# Patient Record
Sex: Female | Born: 1965
Health system: Southern US, Community
[De-identification: ages and names within clinical notes are randomized; demographics above are authoritative.]

## PROBLEM LIST (undated history)

## (undated) DIAGNOSIS — E119 Type 2 diabetes mellitus without complications: Secondary | ICD-10-CM

## (undated) DIAGNOSIS — K219 Gastro-esophageal reflux disease without esophagitis: Secondary | ICD-10-CM

## (undated) DIAGNOSIS — M549 Dorsalgia, unspecified: Secondary | ICD-10-CM

## (undated) DIAGNOSIS — G8929 Other chronic pain: Secondary | ICD-10-CM

## (undated) DIAGNOSIS — G4730 Sleep apnea, unspecified: Secondary | ICD-10-CM

## (undated) DIAGNOSIS — I10 Essential (primary) hypertension: Secondary | ICD-10-CM

## (undated) DIAGNOSIS — F419 Anxiety disorder, unspecified: Secondary | ICD-10-CM

## (undated) DIAGNOSIS — IMO0001 Reserved for inherently not codable concepts without codable children: Secondary | ICD-10-CM

## (undated) DIAGNOSIS — F17200 Nicotine dependence, unspecified, uncomplicated: Secondary | ICD-10-CM

## (undated) DIAGNOSIS — M5412 Radiculopathy, cervical region: Secondary | ICD-10-CM

## (undated) DIAGNOSIS — J0100 Acute maxillary sinusitis, unspecified: Secondary | ICD-10-CM

## (undated) DIAGNOSIS — M4802 Spinal stenosis, cervical region: Secondary | ICD-10-CM

## (undated) DIAGNOSIS — F3130 Bipolar disorder, current episode depressed, mild or moderate severity, unspecified: Secondary | ICD-10-CM

## (undated) DIAGNOSIS — M79609 Pain in unspecified limb: Secondary | ICD-10-CM

## (undated) DIAGNOSIS — R209 Unspecified disturbances of skin sensation: Secondary | ICD-10-CM

## (undated) DIAGNOSIS — E1129 Type 2 diabetes mellitus with other diabetic kidney complication: Secondary | ICD-10-CM

## (undated) DIAGNOSIS — M545 Low back pain, unspecified: Secondary | ICD-10-CM

## (undated) DIAGNOSIS — J329 Chronic sinusitis, unspecified: Secondary | ICD-10-CM

## (undated) DIAGNOSIS — J3089 Other allergic rhinitis: Secondary | ICD-10-CM

## (undated) DIAGNOSIS — R809 Proteinuria, unspecified: Secondary | ICD-10-CM

## (undated) DIAGNOSIS — Z1231 Encounter for screening mammogram for malignant neoplasm of breast: Secondary | ICD-10-CM

## (undated) DIAGNOSIS — E782 Mixed hyperlipidemia: Secondary | ICD-10-CM

## (undated) HISTORY — PX: HAND SURGERY: SHX662

## (undated) HISTORY — PX: ABDOMINAL HYSTERECTOMY: SHX81

## (undated) MED ORDER — METFORMIN ER 500 MG 24 HR TABLET,EXTENDED RELEASE
500 mg | ORAL_TABLET | Freq: Every day | ORAL | Status: DC
Start: ? — End: 2012-10-30

## (undated) MED ORDER — TRIAMCINOLONE ACETONIDE 55 MCG NASAL SPRAY AEROSOL
55 mcg | NASAL | Status: DC
Start: ? — End: 2013-08-08

## (undated) MED ORDER — TRAZODONE 300 MG TAB
300 mg | ORAL_TABLET | Freq: Every evening | ORAL | Status: DC
Start: ? — End: 2015-04-17

## (undated) MED ORDER — NICOTINE 10 MG INHALATION CARTRIDGE
10 mg | RESPIRATORY_TRACT | Status: AC | PRN
Start: ? — End: 2012-12-16

## (undated) MED ORDER — PANTOPRAZOLE 40 MG TAB, DELAYED RELEASE
40 mg | ORAL_TABLET | Freq: Every day | ORAL | Status: DC
Start: ? — End: 2015-04-17

## (undated) MED ORDER — AZITHROMYCIN 250 MG TAB
250 mg | ORAL_TABLET | ORAL | Status: DC
Start: ? — End: 2012-11-25

## (undated) MED ORDER — ALPRAZOLAM 1 MG TAB
1 mg | ORAL_TABLET | ORAL | Status: DC
Start: ? — End: 2013-06-19

## (undated) MED ORDER — NAPROXEN 500 MG TAB
500 mg | ORAL_TABLET | Freq: Two times a day (BID) | ORAL | Status: DC
Start: ? — End: 2014-01-29

## (undated) MED ORDER — ALPRAZOLAM 1 MG TAB
1 mg | ORAL_TABLET | ORAL | Status: DC
Start: ? — End: 2013-05-26

## (undated) MED ORDER — OXYCODONE-ACETAMINOPHEN 5 MG-325 MG TAB
5-325 mg | ORAL_TABLET | Freq: Two times a day (BID) | ORAL | Status: DC | PRN
Start: ? — End: 2012-10-25

## (undated) MED ORDER — CHLORTHALIDONE 25 MG TAB
25 mg | ORAL_TABLET | Freq: Every day | ORAL | Status: DC
Start: ? — End: 2013-04-26

## (undated) MED ORDER — CLONIDINE 0.2 MG TAB
0.2 mg | ORAL_TABLET | Freq: Two times a day (BID) | ORAL | Status: DC
Start: ? — End: 2015-05-03

## (undated) MED ORDER — ALPRAZOLAM 1 MG TAB
1 mg | ORAL_TABLET | ORAL | Status: DC
Start: ? — End: 2015-04-17

## (undated) MED ORDER — AMOXICILLIN CLAVULANATE 875 MG-125 MG TAB
875-125 mg | ORAL_TABLET | Freq: Two times a day (BID) | ORAL | Status: AC
Start: ? — End: 2013-08-10

## (undated) MED ORDER — INSULIN DETEMIR 100 UNIT/ML (3 ML) SUB-Q PEN
100 unit/mL (3 mL) | Freq: Two times a day (BID) | SUBCUTANEOUS | Status: DC
Start: ? — End: 2012-10-25

## (undated) MED ORDER — METFORMIN ER 500 MG 24 HR TABLET,EXTENDED RELEASE
500 mg | ORAL_TABLET | Freq: Every day | ORAL | Status: DC
Start: ? — End: 2012-12-05

## (undated) MED ORDER — OMEPRAZOLE 40 MG CAP, DELAYED RELEASE
40 mg | ORAL_CAPSULE | Freq: Every day | ORAL | Status: DC
Start: ? — End: 2012-10-25

## (undated) MED ORDER — ALPRAZOLAM 1 MG TAB
1 mg | ORAL_TABLET | ORAL | Status: DC
Start: ? — End: 2013-07-31

## (undated) MED ORDER — TRIAMCINOLONE ACETONIDE 55 MCG NASAL SPRAY AEROSOL
55 mcg | Freq: Every day | NASAL | Status: DC
Start: ? — End: 2013-07-31

## (undated) MED ORDER — METFORMIN 1,000 MG TAB
1000 mg | ORAL_TABLET | Freq: Two times a day (BID) | ORAL | Status: DC
Start: ? — End: 2013-04-26

## (undated) MED ORDER — PANTOPRAZOLE 40 MG TAB, DELAYED RELEASE
40 mg | ORAL_TABLET | Freq: Every day | ORAL | Status: DC
Start: ? — End: 2013-04-26

## (undated) MED ORDER — PEN NEEDLE, DIABETIC 32 GAUGE X 1/4"
32 gauge x 1/4" | Freq: Four times a day (QID) | Status: DC
Start: ? — End: 2014-01-29

## (undated) MED ORDER — NICOTINE 10 MG/ML NASAL SPRAY
10 mg/mL | Freq: Four times a day (QID) | NASAL | Status: DC | PRN
Start: ? — End: 2013-04-26

## (undated) MED ORDER — METFORMIN SR 500 MG 24 HR TABLET
500 mg | ORAL_TABLET | Freq: Two times a day (BID) | ORAL | Status: DC
Start: ? — End: 2013-10-31

## (undated) MED ORDER — INSULIN LISPRO 100 UNIT/ML (3 ML) SUB-Q PEN
100 unit/mL | SUBCUTANEOUS | Status: DC
Start: ? — End: 2014-01-29

## (undated) MED ORDER — FLUTICASONE 50 MCG/ACTUATION NASAL SPRAY, SUSP
50 mcg/actuation | Freq: Every day | NASAL | Status: DC
Start: ? — End: 2015-04-17

## (undated) MED ORDER — CLOPIDOGREL 75 MG TAB
75 mg | ORAL_TABLET | Freq: Every day | ORAL | Status: DC
Start: ? — End: 2013-04-26

## (undated) MED ORDER — PROMETHAZINE-CODEINE 6.25 MG-10 MG/5 ML SYRUP
Freq: Four times a day (QID) | ORAL | Status: DC | PRN
Start: ? — End: 2014-01-29

## (undated) MED ORDER — ALPRAZOLAM 1 MG TAB
1 mg | ORAL_TABLET | ORAL | Status: DC
Start: ? — End: 2013-04-26

## (undated) MED ORDER — ALPRAZOLAM 1 MG TAB
1 mg | ORAL_TABLET | Freq: Two times a day (BID) | ORAL | Status: DC
Start: ? — End: 2013-03-16

## (undated) MED ORDER — METFORMIN SR 500 MG 24 HR TABLET
500 mg | ORAL_TABLET | Freq: Two times a day (BID) | ORAL | Status: DC
Start: ? — End: 2015-05-03

## (undated) MED ORDER — FENOFIBRATE 160 MG TAB
160 mg | ORAL_TABLET | Freq: Every day | ORAL | Status: DC
Start: ? — End: 2013-04-26

## (undated) MED ORDER — PRAVASTATIN 20 MG TAB
20 mg | ORAL_TABLET | Freq: Every evening | ORAL | Status: DC
Start: ? — End: 2014-01-29

## (undated) MED ORDER — OXYCODONE-ACETAMINOPHEN 5 MG-325 MG TAB
5-325 mg | ORAL_TABLET | Freq: Two times a day (BID) | ORAL | Status: DC | PRN
Start: ? — End: 2012-11-25

## (undated) MED ORDER — TRAZODONE 300 MG TAB
300 mg | ORAL_TABLET | Freq: Every evening | ORAL | Status: DC
Start: ? — End: 2013-04-26

## (undated) MED ORDER — SIMVASTATIN 40 MG TAB
40 mg | ORAL_TABLET | Freq: Every evening | ORAL | Status: DC
Start: ? — End: 2012-12-26

## (undated) MED ORDER — VALSARTAN 160 MG TAB
160 mg | ORAL_TABLET | Freq: Every day | ORAL | Status: DC
Start: ? — End: 2013-04-26

## (undated) MED ORDER — LOSARTAN 50 MG TAB
50 mg | ORAL_TABLET | Freq: Every day | ORAL | Status: DC
Start: ? — End: 2014-01-29

## (undated) MED ORDER — INSULIN LISPRO 100 UNIT/ML (3 ML) SUB-Q PEN
100 unit/mL | SUBCUTANEOUS | Status: DC
Start: ? — End: 2012-10-25

## (undated) MED ORDER — SIMVASTATIN 40 MG TAB
40 mg | ORAL_TABLET | Freq: Every evening | ORAL | Status: DC
Start: ? — End: 2013-04-26

## (undated) MED ORDER — NICOTINE 21 MG/24 HR DAILY PATCH
21 mg/24 hr | MEDICATED_PATCH | TRANSDERMAL | Status: AC
Start: ? — End: 2013-05-26

## (undated) MED ORDER — HYDROCODONE-ACETAMINOPHEN 5 MG-325 MG TAB
5-325 mg | ORAL_TABLET | Freq: Three times a day (TID) | ORAL | Status: DC | PRN
Start: ? — End: 2013-07-31

## (undated) MED ORDER — INSULIN DETEMIR 100 UNIT/ML (3 ML) SUB-Q PEN
100 unit/mL (3 mL) | Freq: Two times a day (BID) | SUBCUTANEOUS | Status: DC
Start: ? — End: 2013-04-26

## (undated) MED ORDER — HYDROCODONE-ACETAMINOPHEN 5 MG-500 MG TAB
5-500 mg | ORAL_TABLET | Freq: Three times a day (TID) | ORAL | Status: DC | PRN
Start: ? — End: 2013-04-26

## (undated) MED ORDER — NICOTINE 10 MG INHALATION CARTRIDGE
10 mg | RESPIRATORY_TRACT | Status: DC | PRN
Start: ? — End: 2012-11-16

## (undated) MED ORDER — BUDESONIDE 32 MCG/ACTUATION NASAL SPRAY
32 mcg/actuation | Freq: Every day | NASAL | Status: DC
Start: ? — End: 2013-07-31

## (undated) MED ORDER — ERGOCALCIFEROL (VITAMIN D2) 50,000 UNIT CAP
1250 mcg (50,000 unit) | ORAL_CAPSULE | ORAL | Status: DC
Start: ? — End: 2014-01-29

## (undated) MED ORDER — OXYCODONE-ACETAMINOPHEN 5 MG-325 MG TAB
5-325 mg | ORAL_TABLET | Freq: Two times a day (BID) | ORAL | Status: DC | PRN
Start: ? — End: 2012-09-26

## (undated) MED ORDER — FENOFIBRATE 160 MG TAB
160 mg | ORAL_TABLET | Freq: Every day | ORAL | Status: DC
Start: ? — End: 2014-01-29

## (undated) MED ORDER — INSULIN LISPRO 100 UNIT/ML (3 ML) SUB-Q PEN
100 unit/mL | SUBCUTANEOUS | Status: DC
Start: ? — End: 2013-04-26

## (undated) MED ORDER — INSULIN DETEMIR 100 UNIT/ML (3 ML) SUB-Q PEN
100 unit/mL (3 mL) | Freq: Two times a day (BID) | SUBCUTANEOUS | Status: DC
Start: ? — End: 2014-01-29

## (undated) MED ORDER — INSULIN DETEMIR 100 UNIT/ML (3 ML) SUB-Q PEN
100 unit/mL (3 mL) | Freq: Two times a day (BID) | SUBCUTANEOUS | Status: DC
Start: ? — End: 2012-12-26

---

## 1998-12-20 ENCOUNTER — Emergency Department (HOSPITAL_COMMUNITY): Admission: EM | Admit: 1998-12-20 | Discharge: 1998-12-20 | Payer: Self-pay | Admitting: Emergency Medicine

## 1999-11-16 ENCOUNTER — Encounter: Payer: Self-pay | Admitting: Emergency Medicine

## 1999-11-16 ENCOUNTER — Emergency Department (HOSPITAL_COMMUNITY): Admission: EM | Admit: 1999-11-16 | Discharge: 1999-11-16 | Payer: Self-pay | Admitting: Emergency Medicine

## 2000-01-24 ENCOUNTER — Emergency Department (HOSPITAL_COMMUNITY): Admission: EM | Admit: 2000-01-24 | Discharge: 2000-01-25 | Payer: Self-pay

## 2000-01-26 ENCOUNTER — Emergency Department (HOSPITAL_COMMUNITY): Admission: EM | Admit: 2000-01-26 | Discharge: 2000-01-26 | Payer: Self-pay | Admitting: Emergency Medicine

## 2000-11-23 ENCOUNTER — Emergency Department (HOSPITAL_COMMUNITY): Admission: EM | Admit: 2000-11-23 | Discharge: 2000-11-23 | Payer: Self-pay | Admitting: Internal Medicine

## 2002-05-29 ENCOUNTER — Emergency Department (HOSPITAL_COMMUNITY): Admission: EM | Admit: 2002-05-29 | Discharge: 2002-05-29 | Payer: Self-pay | Admitting: Emergency Medicine

## 2002-06-05 ENCOUNTER — Emergency Department (HOSPITAL_COMMUNITY): Admission: EM | Admit: 2002-06-05 | Discharge: 2002-06-05 | Payer: Self-pay

## 2002-07-11 ENCOUNTER — Emergency Department (HOSPITAL_COMMUNITY): Admission: EM | Admit: 2002-07-11 | Discharge: 2002-07-11 | Payer: Self-pay | Admitting: Emergency Medicine

## 2002-07-11 ENCOUNTER — Encounter: Payer: Self-pay | Admitting: Emergency Medicine

## 2002-11-06 ENCOUNTER — Emergency Department (HOSPITAL_COMMUNITY): Admission: EM | Admit: 2002-11-06 | Discharge: 2002-11-06 | Payer: Self-pay | Admitting: Emergency Medicine

## 2002-11-06 ENCOUNTER — Encounter: Payer: Self-pay | Admitting: Emergency Medicine

## 2003-03-01 ENCOUNTER — Emergency Department (HOSPITAL_COMMUNITY): Admission: EM | Admit: 2003-03-01 | Discharge: 2003-03-01 | Payer: Self-pay | Admitting: Emergency Medicine

## 2003-03-01 ENCOUNTER — Encounter: Payer: Self-pay | Admitting: Emergency Medicine

## 2008-01-04 LAB — HEMOGLOBIN A1C WITH EAG: Hemoglobin A1c: 7.1 % — ABNORMAL HIGH (ref 4.2–5.8)

## 2008-01-04 LAB — ESTRADIOL: ESTRADIOL: 14 pg/mL — ABNORMAL LOW (ref 17–184)

## 2008-01-31 LAB — CBC WITH AUTOMATED DIFF
ABS. BASOPHILS: 0 10*3/uL (ref 0.0–0.1)
ABS. EOSINOPHILS: 0 10*3/uL (ref 0.0–0.4)
ABS. LYMPHOCYTES: 2.8 10*3/uL (ref 0.8–3.5)
ABS. MONOCYTES: 0.4 10*3/uL (ref 0–1.0)
ABS. NEUTROPHILS: 2.3 10*3/uL (ref 1.8–8.0)
BASOPHILS: 0 % (ref 0–1)
EOSINOPHILS: 1 % (ref 0–7)
HCT: 44.8 % (ref 35.0–47.0)
HGB: 14.1 g/dL (ref 11.5–16.0)
LYMPHOCYTES: 50 % — ABNORMAL HIGH (ref 12–49)
MCH: 29.4 PG (ref 26.0–34.0)
MCHC: 31.5 g/dL (ref 30.0–35.0)
MCV: 93.5 FL (ref 80.0–99.0)
MONOCYTES: 7 % (ref 5–13)
NEUTROPHILS: 42 % (ref 32–75)
PLATELET: 247 10*3/uL (ref 150–400)
RBC: 4.79 M/uL (ref 3.80–5.20)
RDW: 14.2 % (ref 11.5–14.5)
WBC: 5.5 10*3/uL (ref 3.6–11.0)

## 2008-01-31 LAB — VALPROIC ACID: Valproic acid: NOT DETECTED ug/ml (ref 50–100)

## 2008-01-31 LAB — METABOLIC PANEL, BASIC
Anion gap: 15 (ref 5–15)
BUN/Creatinine ratio: 11 — ABNORMAL LOW (ref 12–20)
BUN: 15 MG/DL (ref 6–20)
CO2: 23 MMOL/L (ref 21–32)
Calcium: 8.9 MG/DL (ref 8.5–10.1)
Chloride: 100 MMOL/L (ref 97–108)
Creatinine: 1.4 MG/DL — ABNORMAL HIGH (ref 0.6–1.3)
GFR est AA: 53 mL/min/{1.73_m2} — ABNORMAL LOW (ref >60)
GFR est non-AA: 44 mL/min/{1.73_m2} — ABNORMAL LOW (ref >60)
Glucose: 95 MG/DL (ref 65–105)
Potassium: 4.2 MMOL/L (ref 3.5–5.1)
Sodium: 138 MMOL/L (ref 136–145)

## 2008-02-02 LAB — HIV-1 RNA QL
HIV 1 Ab: NEGATIVE
HIV1 ANTIBODY,HIVR: NEGATIVE

## 2008-07-11 MED ORDER — METOPROLOL SUCCINATE SR 50 MG 24 HR TAB
50 mg | ORAL_TABLET | Freq: Every day | ORAL | Status: DC
Start: 2008-07-11 — End: 2009-03-04

## 2008-07-11 MED ORDER — OXYCODONE-ACETAMINOPHEN 5 MG-325 MG TAB
5-325 mg | ORAL_TABLET | Freq: Two times a day (BID) | ORAL | Status: DC
Start: 2008-07-11 — End: 2008-08-07

## 2008-07-11 MED ORDER — ALPRAZOLAM 1 MG TAB
1 mg | ORAL_TABLET | Freq: Two times a day (BID) | ORAL | Status: DC
Start: 2008-07-11 — End: 2008-12-18

## 2008-07-11 MED ORDER — TRAZODONE 100 MG TAB
100 mg | ORAL_TABLET | ORAL | Status: DC
Start: 2008-07-11 — End: 2008-10-31

## 2008-07-11 MED ORDER — ESOMEPRAZOLE MAGNESIUM 40 MG CAP, DELAYED RELEASE
40 mg | ORAL_CAPSULE | Freq: Every day | ORAL | Status: DC
Start: 2008-07-11 — End: 2008-07-13

## 2008-07-11 NOTE — Progress Notes (Signed)
Ms. Claros is here for follow up of her chronic illnesses including hypertension, chronic low back pain, osteoarthritis and anxiety/depression with bipolar disorder.  Patient has had increasing headaches, tension in nature, over the last few weeks, but has been out of her Toprol-XL.  Patient denies any nausea, vomiting, chest pain, shortness of breath, fevers, chills, paresthesias or weakness.  Patient's symptoms are fairly well controlled with her current medications.  Patient is due for her annual Pap and pelvic, but will do this next visit along with her lab work.      Physical Exam:   GENERAL:  A mildly obese black female in no acute distress.    LUNGS:  Clear to auscultation bilaterally.  HEART:  Regular rate and rhythm without murmurs, rubs or gallops.    ABDOMEN:  Soft and non-tender.   EXTREMITIES:  No clicks, clubbing or edema.   NEUROLOGICALLY:  Plus two/four deep tendon reflexes bilaterally.  Normal sensation and strength.  Negative straight leg raising.    BACK:  Positive lower lumbar tenderness to palpation.  Decreased range of motion in flexion and extension.      Impression:  1.  Hypertension.   2.  Bipolar disorder.  3.  Chronic low back pain.    4.  GERD.     Plan:   Patient was given refills of her medications as listed in chart.  Restart Toprol-XL 50 mg q day, and patient also given prescription for Nexium 40 mg q day, and is to follow up in one to two months for her Pap and pelvic, and laboratories.

## 2008-07-11 NOTE — Progress Notes (Signed)
Med refill and FBW  Pt had back xrays done July 29 at [facility]  C/o headaches

## 2008-07-11 NOTE — Telephone Encounter (Signed)
Patient requesting refill for serquel 400mg .  [facility]

## 2008-07-13 MED ORDER — QUETIAPINE 400 MG TAB
400 mg | ORAL_TABLET | Freq: Every evening | ORAL | Status: DC | PRN
Start: 2008-07-13 — End: 2008-10-04

## 2008-07-13 MED ORDER — OMEPRAZOLE 20 MG CAP, DELAYED RELEASE
20 mg | ORAL_CAPSULE | Freq: Every day | ORAL | Status: DC
Start: 2008-07-13 — End: 2008-10-31

## 2008-07-23 NOTE — Telephone Encounter (Signed)
Awaiting to see if Dr. Samuel Bouche can sign off.

## 2008-07-23 NOTE — Telephone Encounter (Signed)
Type Assignedto Created Createdby Description view/update [id number] CLOSED 2 [facility]_Nurse Message rkiiffner 07/20/2008 ewagner [facility]; July 20, 2008; E.Wagner; Time:9:10am; Pt.dob:August 02, 1966; Phone:[phone] (Treace calling from [facility]); Dr.Dodd--- Treace faxed over request for C-tap supplies, and is inquiring on the status of that fax being sent back. Fax number:[phone]

## 2008-07-23 NOTE — Telephone Encounter (Signed)
Faxed form signed off by Dr. Samuel Bouche.

## 2008-08-07 LAB — AMB POC RAPID STREP A: Group A Strep Ag: NEGATIVE

## 2008-08-07 MED ORDER — OXYCODONE-ACETAMINOPHEN 5 MG-325 MG TAB
5-325 mg | ORAL_TABLET | Freq: Two times a day (BID) | ORAL | Status: DC
Start: 2008-08-07 — End: 2008-09-04

## 2008-08-07 MED ORDER — CYCLOBENZAPRINE 10 MG TAB
10 mg | ORAL_TABLET | Freq: Three times a day (TID) | ORAL | Status: DC | PRN
Start: 2008-08-07 — End: 2008-08-20

## 2008-08-07 MED ORDER — PANTOPRAZOLE 20 MG TAB, DELAYED RELEASE
20 mg | ORAL_TABLET | Freq: Every day | ORAL | Status: DC
Start: 2008-08-07 — End: 2008-10-04

## 2008-08-07 MED ORDER — AZITHROMYCIN 250 MG TAB
250 mg | ORAL_TABLET | Freq: Every day | ORAL | Status: AC
Start: 2008-08-07 — End: 2008-08-12

## 2008-08-07 NOTE — Progress Notes (Signed)
Ms. Husband presents today complaining of sore throat, nasal congestion, postnasal drip, occasional cough and earache, especially left greater than right, associated with headaches over the past four to five days.  She has had some mild left lower quadrant muscle spasms and loose stools, but denies nausea, vomiting.  No melena or bright red blood per rectum.  Patient has been using Tylenol and Robitussin cough syrup without relief.  She also needs a refill on Percocet for chronic low back pain, and arthritis.      Objective:   GENERAL:  Obese black female in no acute distress.   VITAL SIGNS:  Temperature 99, blood pressure 112/86.  HEENT:  The TMs are clear.  There are some swollen nasal turbinates with mild yellowish nasal discharge.  Oropharynx posterior mild erythema with 2+ tonsils.  NECK:  Supple without lymphadenopathy or thyromegaly.  LUNGS:  Clear to auscultation bilaterally.   HEART:  Regular rate and rhythm without murmurs.   ABDOMEN:  Soft, obese and nontender, normal bowel sounds.    Studies:  Rapid strep was negative.     Impression:   1.  Acute URI and tonsillitis.    2.  Left lower quadrant muscle spasms and pain.   3.  History of GERD.   4.  Chronic low back pain.     Plan:   Z-Pak to be used as directed, Tylenol, Mucinex D, 1 bid, refill of Percocet 5/325 mg, 1 bid prn severe back pain, #60/0, Flexeril 10 mg, 1 tid prn muscle spasms.  She is to follow up if symptoms are not improving or worsening.

## 2008-08-07 NOTE — Progress Notes (Signed)
Sore throat, cough onset Aug 15  also requests RX for Flexeril

## 2008-08-08 NOTE — Telephone Encounter (Signed)
Pt is already using percocet,  May use Mucinex DM bid

## 2008-08-08 NOTE — Progress Notes (Addendum)
Addended by: Marlena Clipper on: 08/08/2008      Modules accepted: Orders

## 2008-08-08 NOTE — Telephone Encounter (Signed)
Pt would like something for cough   ([phone] Pt #)

## 2008-08-09 NOTE — Telephone Encounter (Signed)
Pt notified

## 2008-08-20 MED ORDER — CYCLOBENZAPRINE 10 MG TAB
10 mg | ORAL_TABLET | Freq: Three times a day (TID) | ORAL | Status: AC | PRN
Start: 2008-08-20 — End: 2008-08-30

## 2008-08-20 NOTE — Telephone Encounter (Signed)
[facility] 08.31.09 [place] 8:35AM DR.DODD [id number] [phone] Patient called for a prescription refil Flexeril 10mg  to be called into [facility] @230 .[phone]. Informed patient to in near future to have pharmacy fax over refill requests. BEST CONTACT NUMBER [phone]

## 2008-09-04 MED ORDER — OXYCODONE-ACETAMINOPHEN 5 MG-325 MG TAB
5-325 mg | ORAL_TABLET | Freq: Two times a day (BID) | ORAL | Status: DC
Start: 2008-09-04 — End: 2008-10-04

## 2008-09-04 MED ORDER — METOCLOPRAMIDE 10 MG TAB
10 mg | ORAL_TABLET | Freq: Every evening | ORAL | Status: DC | PRN
Start: 2008-09-04 — End: 2008-10-04

## 2008-09-04 MED ORDER — CYCLOBENZAPRINE 10 MG TAB
10 mg | ORAL_TABLET | Freq: Three times a day (TID) | ORAL | Status: DC | PRN
Start: 2008-09-04 — End: 2008-09-19

## 2008-09-04 NOTE — Progress Notes (Signed)
rf percocet and flexeril

## 2008-09-04 NOTE — Progress Notes (Signed)
Mary Mayo is here for follow up of chronic low back pain and GERD.  The patient has significant right hand stiffness and spasms, which has helped with Flexeril and needs refill.  The patient takes Percocet twice a day, which helps her low back pain.  Patient also complains of a dry cough primarily just at night, but denies any rhinorrhea, postnasal drip, shortness of breath or wheezing.  She does have significant reflux symptoms and has not been consistent with her Prilosec.  Patient has also not been taking Reglan.  Patient also has history of sleep apnea, but needs a respiratory company to come out to bring supplies, which will be arranged for patient.  Patient denies any chest pain, nausea, vomiting or edema.      Objective:   GENERAL:  Obese black female in no acute distress.   HEENT:  Normal.  NECK:  Supple without lymphadenopathy or thyromegaly.  LUNGS:  Clear.   HEART:  Regular rate and rhythm without murmurs.   ABDOMEN:  Soft, and nontender.  EXTREMITIES:  No clicks, clubbing or edema, +2/2 pulses bilaterally.     BACK:  Decreased range of motion at the lower lumbar region, mild tenderness to palpation.  Otherwise full range of motion of other joints.      Impression:   1.  Osteoarthritis and chronic low back pain.   2.  History of right hand pain and stiffness.   3.  GERD, which is probably causing a cough.    4.  Sleep apnea.      Plan:   Patient was restarted on Prilosec 20 mg q day, as well as Reglan 10 mg, 1 h.s.  She was given refills on Flexeril and Percocet.  She is to do range of motion exercises daily and will follow up for laboratories next month or sooner if her symptoms are not resolving.

## 2008-09-10 NOTE — Telephone Encounter (Signed)
meds called to [facility]   N/A at patients phone number

## 2008-09-10 NOTE — Telephone Encounter (Signed)
Please call in Z pack as directed and use Mucinex D twice daily

## 2008-09-10 NOTE — Telephone Encounter (Signed)
Mary Mayo called and is having trouble with her sinus.  Can something be called into [facility].  She stated we already have the number for them.

## 2008-09-19 MED ORDER — METHOCARBAMOL 750 MG TAB
750 mg | ORAL_TABLET | Freq: Three times a day (TID) | ORAL | Status: DC | PRN
Start: 2008-09-19 — End: 2009-04-03

## 2008-09-19 NOTE — Telephone Encounter (Signed)
Refer Pt to orthopedics or PT for further eval and treatment.

## 2008-09-19 NOTE — Telephone Encounter (Signed)
Scheduled pt with [facility] on 09/27/08.  She wants to know if she can have something other than flexeril for her muscles, she says the flexeril is not working

## 2008-09-19 NOTE — Telephone Encounter (Signed)
May try Robaxin 750mg  1 or 2 tid prn #45/0

## 2008-09-19 NOTE — Telephone Encounter (Signed)
Mary Mayo called and concerned that the muscles from her neck to her feet feel twisted and are hurting really bad.  She also stated that her fingers feel like they are trying to cross over each other.  The flexeril is not working can you [facility] [phone].  Please advise.

## 2008-09-28 NOTE — Telephone Encounter (Signed)
Patient states she found a knot under her left arm that is pain full.  Wants to know what she should do.  Patient has an appointment for med refill on 10/04/08.  Should she come in sooner?

## 2008-10-01 NOTE — Telephone Encounter (Signed)
TC to home #, no answer or answering machine

## 2008-10-01 NOTE — Telephone Encounter (Signed)
Not home, left message with person whom answered we were returning her call

## 2008-10-04 MED ORDER — SEROQUEL XR 300 MG TABLET,EXTENDED RELEASE
300 mg | ORAL_TABLET | Freq: Every day | ORAL | Status: DC
Start: 2008-10-04 — End: 2008-12-18

## 2008-10-04 MED ORDER — REGLAN 10 MG TABLET
10 mg | ORAL_TABLET | Freq: Every evening | ORAL | Status: DC | PRN
Start: 2008-10-04 — End: 2009-06-14

## 2008-10-04 MED ORDER — PERCOCET 5 MG-325 MG TABLET
5-325 mg | ORAL_TABLET | Freq: Two times a day (BID) | ORAL | Status: DC
Start: 2008-10-04 — End: 2008-10-31

## 2008-10-04 NOTE — Progress Notes (Signed)
Er follow up

## 2008-10-04 NOTE — Progress Notes (Signed)
Mary Mayo is here for follow up of GERD and chronic low back pain.  She is also here for follow up of a boil, which was lanced in the ER about five days ago.  The patient developed boil/abscess in her left axillary area about 11 days ago, which gradually worsened to the point where it was so severely painful that she went to the emergency room.  The patient was placed on Bactrim Double Strength, 1 bid for 20 days as well.  She has not changed the dressing since she was seen in the emergency room five days ago.  The patient denies any fever, or chills, nausea, vomiting, chest pain, shortness of breath.  The patient needs a refill on her Reglan for reflux, as well as Seroquel for bipolar disorder, and Percocet for her low back pain.  She does have an appointment with an orthopedic doctor in the next couple of weeks.  The patient also needs a CPAP arranged because of sleep apnea.      Objective:   GENERAL:  Obese black female in no acute distress.   LUNGS:  Clear.   HEART:  Regular rate and rhythm without murmurs.   ABDOMEN:  Soft, and nontender.  SKIN:  Left axillary, there is open wound about one cm with Iodoform packing.  There is some minimal erythema and tenderness.  No significant induration or discharge.      Impression:   1.  Left axillary boil/abscess.  2.  GERD.   3.  Bipolar disorder.   4.  Chronic low back pain.     Plan:   Refills of medication were given.  She is to continue to dress the area of the left axillary region daily with antibiotic ointment and bandage.  The Iodoform gauze was removed and cleaned with normal saline, and dressed with gauze and tape.  The patient is to finish her antibiotics and call the emergency room to make sure that the culture that was done is appropriate for the antibiotic she is on.  Follow up if symptoms recur or worsen.

## 2008-10-04 NOTE — Telephone Encounter (Signed)
I requested culture report to be faxed

## 2008-10-04 NOTE — Telephone Encounter (Signed)
Patient calls w/ name and phone number of someone at [facility] that has results of her culture exam that was done there last weekend.  Please call Durward Mallard [phone] to receive culture results.  Please call patient once results have been received.

## 2008-10-09 MED ORDER — KEFLEX 500 MG CAPSULE
500 mg | ORAL_CAPSULE | Freq: Three times a day (TID) | ORAL | Status: DC
Start: 2008-10-09 — End: 2009-01-03

## 2008-10-09 NOTE — Telephone Encounter (Signed)
Ordered to change from Bactrim to keflex per wound culture report, and per Dr. Pollie Friar,  Pt informed, pt also checking on status of cpap machine

## 2008-10-31 MED ORDER — GABAPENTIN 300 MG CAP
300 mg | ORAL_CAPSULE | Freq: Three times a day (TID) | ORAL | Status: DC
Start: 2008-10-31 — End: 2011-04-01

## 2008-10-31 MED ORDER — TRAZODONE 300 MG TAB
300 mg | ORAL_TABLET | ORAL | Status: DC
Start: 2008-10-31 — End: 2009-04-17

## 2008-10-31 MED ORDER — FESOTERODINE SR 4 MG 24 HR TAB
4 mg | ORAL_TABLET | Freq: Every day | ORAL | Status: DC
Start: 2008-10-31 — End: 2008-11-02

## 2008-10-31 MED ORDER — OMEPRAZOLE 40 MG CAP, DELAYED RELEASE
40 mg | ORAL_CAPSULE | Freq: Every day | ORAL | Status: AC
Start: 2008-10-31 — End: 2009-10-26

## 2008-10-31 MED ORDER — OXYCODONE-ACETAMINOPHEN 5 MG-325 MG TAB
5-325 mg | ORAL_TABLET | Freq: Two times a day (BID) | ORAL | Status: DC
Start: 2008-10-31 — End: 2008-12-04

## 2008-10-31 MED ORDER — CYCLOBENZAPRINE 10 MG TAB
10 mg | ORAL_TABLET | Freq: Three times a day (TID) | ORAL | Status: DC | PRN
Start: 2008-10-31 — End: 2009-12-12

## 2008-10-31 NOTE — Progress Notes (Signed)
Subjective   Patient presents for follow up of chronic illnesses including chronic low back pain with radiation to R leg off and on, GERD, Urge Incontinence, and Bipolar D/O.  Pt's conditions are under good control except pt still has esophageal reflux off and on.    ROS:   History obtained from the patient   ?? General: negative for - chills, fever or malaise   HEENT: no sores in mouth, vision problems or ear problems   Respiratory: no shortness of breath, cough, or wheezing   Cardiovascular: no chest pain or dyspnea on exertion or edema   Gastrointestinal: no abdominal pain, change in bowel habits, or black or bloody stools   Neurological: no paresthesias, weakness or dizzyness    Objective  PE:  The patient appears well, alert, oriented x 3, in no distress.   BP 110/70   Pulse 104   Resp 14   Wt 240 lb (108.863 kg)  HEENT:  within normal limits  Neck: supple. No adenopathy, thyromegaly or carotid bruits.   Chest:  Lungs are clear, good air entry, no wheezes, rhonchi or rales.   CV:  Regular rate and rhythm, no murmurs, rubs or gallops.   Abdomen: soft without tenderness, guarding, mass or organomegaly.   Ext:  No clicks, clubbing or edema  Periph pulses NL.  Back: Lower Lumbar tenderness, decreased Flexion  Neuro: WNL    Encounter Diagnoses   Code Name Primary? Qualifier   ??? 296.50 Bipolar Affective, Depress, Unspec     ??? 724.2 Lumbago     ??? 530.81 Esophageal Reflux     ??? 788.31 Urge Incontinence         Orders Placed This Encounter   ??? DISCONTD: Fesoterodine sr 4 mg 24 hr tab     Sig: Take  by mouth daily.   ??? DISCONTD: Gabapentin 300 mg cap     Sig: Take 300 mg by mouth three (3) times daily.   ??? Citalopram 40 mg tab     Sig: Take 40 mg by mouth daily.   ??? DISCONTD: Cyclobenzaprine 10 mg tab     Sig: Take  by mouth three (3) times daily as needed for Muscle Spasm(s).   ??? Trazodone 300 mg tab     Sig: Take 1 Tab by mouth as directed.     Dispense:  30 Tab     Refill:  5    ??? Oxycodone-acetaminophen 5 mg-325 mg tab     Sig: Take 1 Tab by mouth two (2) times a day. Takes prn     Dispense:  60 Tab     Refill:  0   ??? Fesoterodine sr 4 mg 24 hr tab     Sig: Take 4 mg by mouth daily.     Dispense:  30 Tab     Refill:  11   ??? Gabapentin 300 mg cap     Sig: Take 1 Cap by mouth three (3) times daily.     Dispense:  90 Cap     Refill:  5   ??? Cyclobenzaprine 10 mg tab     Sig: Take 1 Tab by mouth three (3) times daily as needed for Muscle Spasm(s).     Dispense:  90 Tab     Refill:  5   ??? Increase Omeprazole 40 mg cap, delayed release     Sig: Take 1 Cap by mouth daily for 360 days.     Dispense:  30 Cap  Refill:  11

## 2008-11-01 LAB — METABOLIC PANEL, BASIC
Anion gap: 6 mmol/L (ref 5–15)
BUN/Creatinine ratio: 15 (ref 12–20)
BUN: 18 MG/DL (ref 6–20)
CO2: 30 MMOL/L (ref 21–32)
Calcium: 9.8 MG/DL (ref 8.5–10.1)
Chloride: 99 MMOL/L (ref 97–108)
Creatinine: 1.2 MG/DL (ref 0.6–1.3)
GFR est AA: >60 mL/min/{1.73_m2} (ref >60)
GFR est non-AA: 52 mL/min/{1.73_m2} — ABNORMAL LOW (ref >60)
Glucose: 117 MG/DL — ABNORMAL HIGH (ref 50–100)
Potassium: 4.6 MMOL/L (ref 3.5–5.1)
Sodium: 135 MMOL/L — ABNORMAL LOW (ref 136–145)

## 2008-11-02 MED ORDER — SOLIFENACIN 5 MG TAB
5 mg | ORAL_TABLET | Freq: Every day | ORAL | Status: AC
Start: 2008-11-02 — End: 2009-10-28

## 2008-11-02 NOTE — Telephone Encounter (Signed)
Gala Murdoch not approved by insurance, vesicare, enablex are, per Dr. Pollie Friar rx for vesicare called to pharmacy

## 2008-11-21 NOTE — Op Note (Signed)
Op Notes signed by  at 11/23/08 0907                  Author: Deitra Mayo, MD  Service: --  Author Type: Physician            Filed:   Date of Service: 11/21/08 1033  Status: Signed          <!--EPICS--> Name:      Mary Mayo, Mary Mayo<BR> MR #:      [id number]                    Surgeon:        Deitra Mayo, M.D.<BR> Account #: [id number]                  Surgery Date:   11/01/2008<BR> DOB:       [birth year]<BR> Age:       42                           Location:<BR> <BR>                              OPERATIVE REPORT<BR> <BR> <BR> <BR> PREOPERATIVE DIAGNOSIS:  Likely sebaceous cyst of the  left axilla.<BR> <BR> POSTPROCEDURE DIAGNOSIS:  Likely sebaceous cyst of the left axilla.<BR> <BR> PROCEDURE:  Excision of a large sebaceous cyst in the left axilla.<BR> <BR> SURGEON:  Deitra Mayo, MD.<BR> <BR> ANESTHESIA:  Local MAC.<BR> <BR> INDICATIONS:   The patient is an [place] female who is status post<BR> an incision and drainage of a lesion in the left axilla who presents with<BR> persistent tenderness and a  palpable nodule.  She desires excision.<BR> <BR> DESCRIPTION OF PROCEDURE:  The  patient was brought into the operating room,<BR> and after the administration of MAC anesthesia, her left axilla was prepped<BR> and draped in the usual sterile fashion.  Approximately, 5 mL of 0.25%<BR> Marcaine with epinephrine was infiltrated into  the skin and subcutaneous<BR> tissue surrounding this area and then a #15 scalpel blade was used to make<BR> an elliptical incision in the skin.  The entire area of concern was excised<BR> with a #15 scalpel blade down to what appeared to be grossly normal<BR>  subcutaneous fat.  This specimen was passed off the field and sent to<BR> pathology.  The wound was closed in layers with 3-0 Vicryl suture for the<BR> deeper layer and interrupted 4-0 Vicryl subcuticular suture for the skin.<BR> An appropriate dressing  was applied.  The patient was allowed to wake up<BR>  and taken to recovery in stable condition.<BR> <BR> <BR> <BR> <BR> <BR> E-Signed By<BR> Deitra Mayo, M.D. 11/23/2008 09:07<BR> Deitra Mayo, M.D.<BR> <BR> cc:   Deitra Mayo, M.D.<BR> <BR>  <BR> <BR> LJ/FI5; D: 11/21/2008  1:00 P; T: 11/21/2008 10:33 A; Doc# [id number]; Job#<BR> [id number]<WN> <!--EPICE-->

## 2008-11-21 NOTE — Op Note (Signed)
Name: Mary Mayo, Mary Mayo  MR #: [id number] Surgeon: Deitra Mayo, M.D.  Account #: [id number] Surgery Date: 11/01/2008  DOB: [birth year]  Age: 42 Location:     OPERATIVE REPORT        PREOPERATIVE DIAGNOSIS: Likely sebaceous cyst of the left axilla.    POSTPROCEDURE DIAGNOSIS: Likely sebaceous cyst of the left axilla.    PROCEDURE: Excision of a large sebaceous cyst in the left axilla.    SURGEON: Deitra Mayo, MD.    ANESTHESIA: Local MAC.    INDICATIONS: The patient is an [place] female who is status post  an incision and drainage of a lesion in the left axilla who presents with  persistent tenderness and a palpable nodule. She desires excision.    DESCRIPTION OF PROCEDURE: The patient was brought into the operating room,  and after the administration of MAC anesthesia, her left axilla was prepped  and draped in the usual sterile fashion. Approximately, 5 mL of 0.25%  Marcaine with epinephrine was infiltrated into the skin and subcutaneous  tissue surrounding this area and then a #15 scalpel blade was used to make  an elliptical incision in the skin. The entire area of concern was excised  with a #15 scalpel blade down to what appeared to be grossly normal  subcutaneous fat. This specimen was passed off the field and sent to  pathology. The wound was closed in layers with 3-0 Vicryl suture for the  deeper layer and interrupted 4-0 Vicryl subcuticular suture for the skin.  An appropriate dressing was applied. The patient was allowed to wake up  and taken to recovery in stable condition.            E-Signed By  Deitra Mayo, M.D. 11/23/2008 09:07  Deitra Mayo, M.D.    cc: Deitra Mayo, M.D.        LJ/FI5; D: 11/21/2008 1:00 P; T: 11/21/2008 10:33 A; Doc# [id number]; Job#  [id number]

## 2008-12-04 MED ORDER — OXYCODONE-ACETAMINOPHEN 5 MG-325 MG TAB
5-325 mg | ORAL_TABLET | Freq: Two times a day (BID) | ORAL | Status: DC
Start: 2008-12-04 — End: 2009-01-03

## 2008-12-04 NOTE — Progress Notes (Signed)
Subjective   Patient presents for follow up of chronic illnesses including Hypertension, Chronic LBP due to DJD and DM, Type II.  Patient does not check BS.  Pt complains of R leg - lateral thigh pain and paresthesias to knee.  ROS:   History obtained from the patient   ?? General: negative for - chills, fever, +fatigue   HEENT: no sores in mouth, vision problems or ear problems   Respiratory: no shortness of breath, cough, or wheezing   Cardiovascular: no chest pain or dyspnea on exertion or edema   Gastrointestinal: no abdominal pain, change in bowel habits, or black or bloody stools   Neurological: no weakness or dizzyness    Objective  PE:  The patient appears well, alert, oriented x 3, in no distress.   BP 118/80   Pulse 100   Resp 16   Wt 233 lb (105.688 kg)  HEENT:  within normal limits  Neck: supple. No adenopathy, thyromegaly or carotid bruits.   Chest:  Lungs are clear, good air entry, no wheezes, rhonchi or rales.   CV:  Regular rate and rhythm, no murmurs, rubs or gallops.   Abdomen: soft without tenderness, guarding, mass or organomegaly.   Ext:  No clicks, clubbing or edema  Periph pulses NL.  Neuro: WNL except mild decreased sensation R lateral leg  Back:  +Lower lumbar and R parasacral tenderness    Encounter Diagnoses   Code Name Primary? Qualifier   ??? 782.0 Disturbance of Skin Sensation Yes    ??? 724.2 Lumbago     ??? 250.00 DM w/o Complication Type II     ??? 401.1 Essential Hypertension, Benign     ??? 780.79 Other Malaise and Fatigue         Orders Placed This Encounter   ??? Metabolic panel, comprehensive   ??? Microalbumin, ur, rand   ??? Cbc w/ automated diff   ??? Lipid panel   ??? Vitamin b12   ??? Hemoglobin a1c   ??? Tsh, 3rd generation   ??? Drug screen ur - no confirm   ??? Oxycodone-acetaminophen 5 mg-325 mg tab     Sig: Take 1 Tab by mouth two (2) times a day. Takes prn     Dispense:  60 Tab     Refill:  0

## 2008-12-05 ENCOUNTER — Ambulatory Visit

## 2008-12-05 LAB — METABOLIC PANEL, COMPREHENSIVE
A-G Ratio: 0.9 — ABNORMAL LOW (ref 1.1–2.2)
ALT (SGPT): 55 U/L (ref 30–65)
AST (SGOT): 25 U/L (ref 15–37)
Albumin: 3.8 g/dL (ref 3.5–5.0)
Alk. phosphatase: 83 U/L (ref 50–136)
Anion gap: 10 mmol/L (ref 5–15)
BUN/Creatinine ratio: 14 (ref 12–20)
BUN: 14 MG/DL (ref 6–20)
Bilirubin, total: 0.2 MG/DL (ref <1.0)
CO2: 29 MMOL/L (ref 21–32)
Calcium: 9.6 MG/DL (ref 8.5–10.1)
Chloride: 101 MMOL/L (ref 97–108)
Creatinine: 1 MG/DL (ref 0.6–1.3)
GFR est AA: >60 mL/min/{1.73_m2} (ref >60)
GFR est non-AA: >60 mL/min/{1.73_m2} (ref >60)
Globulin: 4.3 g/dL — ABNORMAL HIGH (ref 2.0–4.0)
Glucose: 163 MG/DL — ABNORMAL HIGH (ref 50–100)
Potassium: 4.7 MMOL/L (ref 3.5–5.1)
Protein, total: 8.1 g/dL (ref 6.4–8.4)
Sodium: 140 MMOL/L (ref 136–145)

## 2008-12-05 LAB — CBC WITH AUTOMATED DIFF
ABS. BASOPHILS: 0 10*3/uL (ref 0.0–0.1)
ABS. EOSINOPHILS: 0.1 10*3/uL (ref 0.0–0.4)
ABS. LYMPHOCYTES: 3.8 10*3/uL — ABNORMAL HIGH (ref 0.8–3.5)
ABS. MONOCYTES: 0.5 10*3/uL (ref 0.0–1.0)
ABS. NEUTROPHILS: 3.4 10*3/uL (ref 1.8–8.0)
BASOPHILS: 0 % (ref 0–1)
EOSINOPHILS: 1 % (ref 0–7)
HCT: 40.9 % (ref 35.0–47.0)
HGB: 12.7 g/dL (ref 11.5–16.0)
LYMPHOCYTES: 50 % — ABNORMAL HIGH (ref 12–49)
MCH: 28.7 PG (ref 26.0–34.0)
MCHC: 31.1 g/dL (ref 30.0–36.5)
MCV: 92.3 FL (ref 80.0–99.0)
MONOCYTES: 6 % (ref 5–13)
NEUTROPHILS: 43 % (ref 32–75)
PLATELET: 335 10*3/uL (ref 150–400)
RBC: 4.43 M/uL (ref 3.80–5.20)
RDW: 14.7 % — ABNORMAL HIGH (ref 11.5–14.5)
WBC: 7.8 10*3/uL (ref 3.6–11.0)

## 2008-12-05 LAB — TSH 3RD GENERATION: TSH: 1.08 u[IU]/mL (ref 0.35–5.5)

## 2008-12-05 LAB — VITAMIN B12: Vitamin B12: 428 pg/mL (ref 211–911)

## 2008-12-05 LAB — LIPID PANEL
CHOL/HDL Ratio: 5.4 — ABNORMAL HIGH (ref 0–5.0)
Cholesterol, total: 217 MG/DL — ABNORMAL HIGH (ref <200)
HDL Cholesterol: 40 MG/DL (ref 40–60)
LDL, calculated: 141.8 MG/DL — ABNORMAL HIGH (ref 0–100)
Triglyceride: 176 MG/DL (ref 30–200)
VLDL, calculated: 35.2 MG/DL

## 2008-12-05 LAB — MICROALBUMIN, UR, RAND W/ MICROALB/CREAT RATIO
Creatinine, urine random: 166.5 MG/DL — ABNORMAL HIGH (ref 30.0–125.0)
Microalbumin,urine random: 2.69 MG/DL
Microalbumin/Creat ratio (mg/g creat): 16 mg/g

## 2008-12-06 LAB — HEMOGLOBIN A1C WITH EAG: Hemoglobin A1c: 7.6 % — ABNORMAL HIGH (ref 4.2–5.8)

## 2008-12-08 NOTE — Progress Notes (Signed)
Quick Note:    Follow up to treat DM and cholesterol better.  ______

## 2008-12-10 NOTE — Telephone Encounter (Signed)
Vadie states her upper right leg is hurting bad,states she is having sharp pains,says she has enough Percocet to last through this month

## 2008-12-10 NOTE — Progress Notes (Signed)
Quick Note:    Patient notified of lab results  ______

## 2008-12-10 NOTE — Telephone Encounter (Signed)
Opened up accidentally under Dr Lyman Speller

## 2008-12-11 NOTE — Telephone Encounter (Signed)
Mary Mayo has appointment 12/18/08

## 2008-12-18 MED ORDER — ALPRAZOLAM 1 MG TAB
1 mg | ORAL_TABLET | Freq: Two times a day (BID) | ORAL | Status: DC
Start: 2008-12-18 — End: 2009-03-04

## 2008-12-18 MED ORDER — QUETIAPINE SR 300 MG 24 HR TAB
300 mg | ORAL_TABLET | Freq: Every day | ORAL | Status: DC
Start: 2008-12-18 — End: 2010-08-06

## 2008-12-18 MED ORDER — AMOXICILLIN 875 MG TAB
875 mg | ORAL_TABLET | Freq: Two times a day (BID) | ORAL | Status: AC
Start: 2008-12-18 — End: 2008-12-28

## 2008-12-18 MED ORDER — ROSUVASTATIN 10 MG TAB
10 mg | ORAL_TABLET | Freq: Every day | ORAL | Status: AC
Start: 2008-12-18 — End: 2009-12-13

## 2008-12-18 MED ORDER — ROSIGLITAZONE-METFORMIN 2 MG-500 MG TAB
2-500 mg | ORAL_TABLET | Freq: Two times a day (BID) | ORAL | Status: DC
Start: 2008-12-18 — End: 2009-12-12

## 2008-12-18 NOTE — Telephone Encounter (Signed)
seroquel and xanax phoned in to [facility]  5:15pm

## 2008-12-22 NOTE — Progress Notes (Signed)
Mary Mayo is here for follow up of abnormal labs.  Recent labs were discussed -   Lab Results   Component Value Date/Time   ??? Hemoglobin A1C 7.6 12/04/2008  8:40 AM     Lab Results   Component Value Date/Time   ??? Sodium 140 12/04/2008  8:40 AM   ??? Potassium 4.7 12/04/2008  8:40 AM   ??? Chloride 101 12/04/2008  8:40 AM   ??? CO2 29 12/04/2008  8:40 AM   ??? Anion gap 10 12/04/2008  8:40 AM   ??? Glucose 163 12/04/2008  8:40 AM   ??? BUN 14 12/04/2008  8:40 AM   ??? Creatinine 1.0 12/04/2008  8:40 AM   ??? BUN/Creatinine ratio 14 12/04/2008  8:40 AM   ??? GFR est non-AA >60 12/04/2008  8:40 AM   ??? Calcium 9.6 12/04/2008  8:40 AM   ??? GFR est AA >60 12/04/2008  8:40 AM     Lab Results   Component Value Date/Time   ??? Cholesterol, total 217 12/04/2008  8:40 AM   ??? HDL Cholesterol 40 12/04/2008  8:40 AM   ??? LDL, calculated 141.8 12/04/2008  8:40 AM   ??? Triglyceride 176 12/04/2008  8:40 AM   ??? CHOL/HDL Ratio 5.4 12/04/2008  1:61 AM       Patient has had increased pain of R thigh and leg due to nerve impingement of lower back.  Patient also complains of pain and abscess of R upper molar and sees dentist next week.    ROS:   History obtained from the patient   General: negative for - chills, fever +mild fatigue  Respiratory: no shortness of breath, cough, or wheezing   Cardiovascular: no chest pain or dyspnea on exertion or edema   Gastrointestinal: no abdominal pain, change in bowel habits, or black or bloody stools   GU: + polyuria, no dysuria    Objective  PE:  The patient appears well, alert, oriented x 3, in no distress.   BP 130/90   Pulse 100   Resp 12   Wt 234 lb (106.142 kg)  Mouth: + upper R perimolar tenderness and erythema  Neck: supple. No adenopathy, thyromegaly or carotid bruits.   Chest:  Lungs are clear, good air entry, no wheezes, rhonchi or rales.   CV:  Regular rate and rhythm, no murmurs, rubs or gallops.   Abdomen: soft without tenderness, guarding, mass or organomegaly.    Ext:  No clicks, clubbing or edema  Periph pulses NL.  Back - + R lower paralumbar, sacral tenderness and decreased flexion.    Impression:  1. DM w/o Complication Type II, Uncontrolled (250.02)    2. Pure Hypercholesterolemia (272.0)    3. Thoracic or Lumbosacral Neuritis or Radiculitis, Unspecified (724.4)    4. Abscess, Dental (522.5G)        Orders Placed This Encounter   ??? Rosiglitazone-metformin (avandamet) 2-500 mg per tablet     Sig: Take 1 Tab by mouth two (2) times daily (with meals) for 360 days.     Dispense:  60 Tab     Refill:  5   ??? Rosuvastatin (crestor) 10 mg tablet     Sig: Take 1 Tab by mouth daily for 360 days.     Dispense:  30 Tab     Refill:  5   ??? Amoxicillin (amoxil) 875 mg tablet     Sig: Take 1 Tab by mouth two (2) times a day for 10 days.  Dispense:  20 Tab     Refill:  0   Continue other medications.   Follow up in 3 months for recheck of labs.

## 2009-01-03 MED ORDER — HYDROCODONE-HOMATROPINE 5 MG-1.5 MG/5 ML ORAL SOLUTION
Freq: Four times a day (QID) | ORAL | Status: DC | PRN
Start: 2009-01-03 — End: 2009-02-04

## 2009-01-03 MED ORDER — OXYCODONE-ACETAMINOPHEN 5 MG-325 MG TAB
5-325 mg | ORAL_TABLET | Freq: Two times a day (BID) | ORAL | Status: DC
Start: 2009-01-03 — End: 2009-02-04

## 2009-01-03 MED ORDER — AZITHROMYCIN 250 MG TAB
250 mg | ORAL_TABLET | ORAL | Status: AC
Start: 2009-01-03 — End: 2009-01-08

## 2009-01-03 NOTE — Progress Notes (Signed)
Med refill, also cough that won't go away.

## 2009-01-05 NOTE — Progress Notes (Signed)
Subjective         Patient presents today complaining of productive cough of yellow mucous gradually worsening for 10 days.  Patient denies fever, chills, vomiting, diarrhea, rash or earache.  BS are under better controlled recently.  She has had mild nausea.  Pt also needs refill on pain medication for lower back pain and DJD.        Objective:     The patient appears well, alert, oriented x 3, in no distress.   Temp(Src) 98.6 ??F (37 ??C) (Oral)   Wt 239 lb (108.41 kg)  HEENT/neck: ENT - TM's clear, no nasal discharge  oropharynx- mild erythema.   Neck supple. No adenopathy  Chest: Lungs -rhonchi with cough  CV: RRR without M  Abdomen: soft without tenderness, guarding, mass or organomegaly.     1. Cough (786.2)    2. Lumbago (724.2)    3. Acute Bronchitis (466.0)          Orders Placed This Encounter   ??? Xr chest pa and lateral     Standing Status: Future      Number of Occurrences:       Standing Expiration Date: 04/03/2009     Order Specific Question:  Reason for Exam     Answer:  cough for 2 weeks     Order Specific Question:  Is Patient Allergic to Contrast Dye?     Answer:  No     Order Specific Question:  Is Patient Pregnant?     Answer:  No   ??? Azithromycin (zithromax) 250 mg tablet     Sig: Take 1 Tab by mouth for 5 days. Take two tablets today then one tablet daily     Dispense:  6 Tab     Refill:  0   ??? Hydrocodone-homatropine (hycodan) 5-1.5 mg/5 ml syrup     Sig: Take 5 mL by mouth four (4) times daily as needed for Cough.     Dispense:  150 mL     Refill:  0   ??? Oxycodone-acetaminophen (percocet) 5-325 mg per tablet     Sig: Take 1 Tab by mouth two (2) times a day. Takes prn     Dispense:  60 Tab     Refill:  0

## 2009-01-15 NOTE — Progress Notes (Signed)
Quick Note:    Normal CXR  ______

## 2009-01-16 NOTE — Progress Notes (Signed)
Quick Note:    Patient notified  ______

## 2009-02-04 MED ORDER — HYDROCODONE-HOMATROPINE 5 MG-1.5 MG/5 ML ORAL SOLUTION
Freq: Four times a day (QID) | ORAL | Status: DC | PRN
Start: 2009-02-04 — End: 2009-04-03

## 2009-02-04 MED ORDER — OXYCODONE-ACETAMINOPHEN 5 MG-325 MG TAB
5-325 mg | ORAL_TABLET | Freq: Two times a day (BID) | ORAL | Status: DC
Start: 2009-02-04 — End: 2009-03-04

## 2009-02-04 NOTE — Progress Notes (Signed)
Subjective     Patient presents for follow up of chronic illnesses including Hypertension, Hypercholesterolemia and DM, Type II.  Patient checks BS 1-2 times per week and they have been under good control.  Patient has no specific complaints today except persistent cough due to smoking.    ROS:   History obtained from the patient   General: negative for - chills, fever or malaise   HEENT: no sores in mouth, vision problems or ear problems   Respiratory: occasional shortness of breath and  wheezing   Cardiovascular: no chest pain or dyspnea on exertion or edema   Gastrointestinal: no abdominal pain, change in bowel habits, or black or bloody stools   Neurological: no paresthesias, weakness or dizzyness    Objective    PE:  The patient appears well, alert, oriented x 3, in no distress. obese  BP 126/86   Pulse 88   Resp 18   Wt 241 lb 6.4 oz (109.498 kg)  HEENT:  within normal limits  Neck: supple. No adenopathy, thyromegaly or carotid bruits.   Chest:  Lungs are clear, good air entry, no wheezes, rhonchi or rales.   CV:  Regular rate and rhythm, no murmurs, rubs or gallops.   Abdomen: soft without tenderness, guarding, mass or organomegaly.   Ext:  No clicks, clubbing or edema  Periph pulses NL.     Impression:  1. Cough (786.2)    2. Lumbago (724.2)    3. Essential Hypertension, Benign (401.1)    4. DM w/o Complication Type II (250.00)    5. Pure Hypercholesterolemia (272.0)    6. Unspecified Asthma (493.90)          Orders Placed This Encounter   ??? Hydrocodone-homatropine (hycodan) 5-1.5 mg/5 ml syrup     Sig: Take 5 mL by mouth four (4) times daily as needed for Cough.     Dispense:  150 mL     Refill:  0   ??? Oxycodone-acetaminophen (percocet) 5-325 mg per tablet     Sig: Take 1 Tab by mouth two (2) times a day. Takes prn     Dispense:  60 Tab     Refill:  0   Symbicort MDI HFA 2 puffs BID #2 samples  Continue current medications.  Diet and exercise discussed.  Pt advised to quit smoking.   Follow up in 1 month for labs.

## 2009-03-04 MED ORDER — CLONIDINE 0.2 MG TAB
0.2 mg | ORAL_TABLET | Freq: Two times a day (BID) | ORAL | Status: DC
Start: 2009-03-04 — End: 2010-03-05

## 2009-03-04 MED ORDER — DICLOFENAC 1 % TOPICAL GEL
1 % | Freq: Four times a day (QID) | CUTANEOUS | Status: DC
Start: 2009-03-04 — End: 2011-03-03

## 2009-03-04 MED ORDER — OXYCODONE-ACETAMINOPHEN 5 MG-325 MG TAB
5-325 mg | ORAL_TABLET | Freq: Two times a day (BID) | ORAL | Status: DC
Start: 2009-03-04 — End: 2009-04-03

## 2009-03-04 MED ORDER — ALPRAZOLAM 1 MG TAB
1 mg | ORAL_TABLET | Freq: Two times a day (BID) | ORAL | Status: DC
Start: 2009-03-04 — End: 2009-08-06

## 2009-03-04 MED ORDER — METOPROLOL SUCCINATE SR 100 MG 24 HR TAB
100 mg | ORAL_TABLET | Freq: Every day | ORAL | Status: DC
Start: 2009-03-04 — End: 2011-04-01

## 2009-03-04 MED ORDER — VARENICLINE 0.5 MG (11)-1 MG (3X14) TABS IN A DOSE PACK
0.5 mg (11)- 1 mg (42) | PACK | ORAL | Status: DC
Start: 2009-03-04 — End: 2010-01-10

## 2009-03-04 NOTE — Progress Notes (Signed)
Subjective      Pt presents for follow up of chronic illnesses.  Pt complains of R knee pain recently worse than usual.   Pt denies injury or trauma or locking or giving way.  Pt is having T & A on 3/18 by Dr. Daleen Bo.  Pt also needs med for GERD and to help quit smoking.  Pt needs few refills.  Pt is trying to lose wt with diet and exercise without success.  Pt is not checking BS's at home.    ROS:  History obtained from the patient  General: negative for - chills, fever, weight changes or malaise  HEENT: no sore throat, nasal congestion,  vision problems or ear problems  Respiratory: no  shortness of breath, or wheezing  Cardiovascular: no chest pain, palpitations, or dyspnea on exertion  Gastrointestinal: no abdominal pain, N/V, change in bowel habits, or black or bloody stools  Musculoskeletal: no  muscle pain or muscle weakness  Neurological: no numbness, tingling, headache or dizzyness  Psychological: negative for - anxiety, depression, sleep disturbances, suicidal or homicidal ideations             Objective    PE  The patient appears well, alert, oriented x 3, in no distress. Obese  BP 120/82   Pulse 120   Resp 16   Wt 246 lb (111.585 kg)  HEENT - Normal except boggy nasal turbinates  Neck supple -  No adenopathy, thyromegaly or bruits.   Chest: Lungs are clear  CV: RRR with no murmurs  Periph pulses NL. Tachycardic  Abdomen: soft without tenderness, guarding, mass or organomegaly.   Ext: No clicks, clubbing or edema. R knee - + upper LCL tenderness, FROM, neg Lachman's and McMurrays.  No effusion.    Impression:  1. Pain in Joint, Lower Leg (719.46)    2. Essential Hypertension, Benign (401.1)    3. DM w/o Complication Type II (250.00)    4. Pure Hypercholesterolemia (272.0)    5. Bipolar Affective, Depress, Unspec (296.50)    6. Lumbago (724.2)    7. Tobacco Use Disorder (305.1)    8. Esophageal Reflux (530.81)    9. Unspecified Tachycardia (785.0)          Orders Placed This Encounter    ??? Clonidine (catapress) 0.2 mg tablet     Sig: Take 1 Tab by mouth two (2) times a day.     Dispense:  60 Tab     Refill:  11   ??? Alprazolam (xanax) 1 mg tablet     Sig: Take 1 Tab by mouth two (2) times a day.     Dispense:  60 Tab     Refill:  2   ??? Increase Metoprolol-xl (toprol-xl) 100 mg xl tablet     Sig: Take 1 Tab by mouth daily.     Dispense:  30 Tab     Refill:  5   ??? Oxycodone-acetaminophen (percocet) 5-325 mg per tablet     Sig: Take 1 Tab by mouth two (2) times a day. Takes prn     Dispense:  60 Tab     Refill:  0   ??? Diclofenac sodium (voltaren) 1 % topical gel     Sig: Apply 4 g to affected area four (4) times daily.     Dispense:  100 g     Refill:  1   ??? Varenicline (chantix starter pak)     Sig: Take  by mouth. Per Dose pack  instructions     Dispense:  1 Package     Refill:  0   Pt given samples of Kapidex 60mg  every day #25  Continue other current medications.  Diet and exercise discussed.  Pt advised to quit smoking and given Chantix to help.  Follow up in 1 month for recheck and labs.

## 2009-04-03 ENCOUNTER — Ambulatory Visit

## 2009-04-03 MED ORDER — CITALOPRAM 40 MG TAB
40 mg | ORAL_TABLET | Freq: Every day | ORAL | Status: AC
Start: 2009-04-03 — End: 2009-05-03

## 2009-04-03 MED ORDER — OXYCODONE-ACETAMINOPHEN 5 MG-325 MG TAB
5-325 mg | ORAL_TABLET | Freq: Two times a day (BID) | ORAL | Status: DC
Start: 2009-04-03 — End: 2009-05-02

## 2009-04-03 MED ORDER — FEXOFENADINE 180 MG TAB
180 mg | ORAL_TABLET | Freq: Every day | ORAL | Status: DC
Start: 2009-04-03 — End: 2009-10-11

## 2009-04-03 NOTE — Progress Notes (Signed)
Subjective    Pt presents for follow up of chronic illnesses.  Pt is doing well on medications and denies complaints except sinus congestion and headaches.  Pt has been checking BS's and they have been good.  ROS:   History obtained from the patient   General: negative for - chills, fever or weight changes  HEENT: no sores in mouth, vision problems or ear problems   Respiratory: no shortness of breath, cough, or wheezing   Cardiovascular: no chest pain, dyspnea on exertion, or edema  Gastrointestinal: no abdominal pain, change in bowel habits, or black or bloody stools            Objective  PE  The patient appears well, alert, oriented x 3, in no distress.   BP 112/76   Pulse 96   Temp(Src) 98.3 ??F (36.8 ??C) (Oral)   Resp 14   Wt 239 lb (108.41 kg)  HEENT - Normal except boggy nasal turbinates and clear nasal discharge  Neck supple -  No adenopathy, thyromegaly or bruits.   Chest: Lungs are clear  CV: RRR with no murmurs  Periph pulses NL.   Abdomen: soft without tenderness, guarding, mass or organomegaly.   Ext: No clicks, clubbing or edema. Normal monofilament testing.    Impression:  1. Allergic rhinitis due to pollen (477.0)    2. Essential hypertension, benign (401.1)    3. Lumbago (724.2)    4. Bipolar affective, depress, unspec (296.50)    5. DM w/o complication type II (250.00)    6. Pure hypercholesterolemia (272.0)        Orders Placed This Encounter   ??? Metabolic panel, comprehensive     Standing Status: Future      Number of Occurrences: 1      Standing Expiration Date: 10/03/2009   ??? Lipid panel     Standing Status: Future      Number of Occurrences: 1      Standing Expiration Date: 10/03/2009   ??? Hemoglobin a1c     Standing Status: Future      Number of Occurrences: 1      Standing Expiration Date: 10/03/2009   ??? Fexofenadine (allegra) 180 mg tablet     Sig: Take 1 Tab by mouth daily. Prn allergies     Dispense:  30 Tab     Refill:  3   ??? Citalopram (celexa) 40 mg tablet      Sig: Take 1 Tab by mouth daily for 30 days.     Dispense:  30 Tab     Refill:  2   ??? Oxycodone-acetaminophen (percocet) 5-325 mg per tablet     Sig: Take 1 Tab by mouth two (2) times a day. Takes prn     Dispense:  60 Tab     Refill:  0   Recheck in 1 month/prn.  Veramyst NS 2 sprays each nostril every day.  Mucinex D 1 BID  Prn cough and congestion.  Follow up prn symptoms not gradually improving or worsening.

## 2009-04-04 LAB — METABOLIC PANEL, COMPREHENSIVE
A-G Ratio: 0.9 — ABNORMAL LOW (ref 1.1–2.2)
ALT (SGPT): 36 U/L (ref 12–78)
AST (SGOT): 20 U/L (ref 15–37)
Albumin: 3.6 g/dL (ref 3.5–5.0)
Alk. phosphatase: 74 U/L (ref 50–136)
Anion gap: 9 mmol/L (ref 5–15)
BUN/Creatinine ratio: 12 (ref 12–20)
BUN: 12 MG/DL (ref 6–20)
Bilirubin, total: 0.2 MG/DL (ref 0.2–1.0)
CO2: 27 MMOL/L (ref 21–32)
Calcium: 9.1 MG/DL (ref 8.5–10.1)
Chloride: 102 MMOL/L (ref 97–108)
Creatinine: 1 MG/DL (ref 0.6–1.3)
GFR est AA: >60 mL/min/{1.73_m2} (ref >60)
GFR est non-AA: >60 mL/min/{1.73_m2} (ref >60)
Globulin: 4.1 g/dL — ABNORMAL HIGH (ref 2.0–4.0)
Glucose: 128 MG/DL — ABNORMAL HIGH (ref 65–100)
Potassium: 4.4 MMOL/L (ref 3.5–5.1)
Protein, total: 7.7 g/dL (ref 6.4–8.2)
Sodium: 138 MMOL/L (ref 136–145)

## 2009-04-04 LAB — LIPID PANEL
CHOL/HDL Ratio: 5.1 — ABNORMAL HIGH (ref 0–5.0)
Cholesterol, total: 168 MG/DL (ref <200)
HDL Cholesterol: 33 MG/DL
LDL, calculated: 92.6 MG/DL (ref 0–100)
Triglyceride: 212 MG/DL
VLDL, calculated: 42.4 MG/DL

## 2009-04-05 LAB — HEMOGLOBIN A1C WITH EAG: Hemoglobin A1c: 6.4 % — ABNORMAL HIGH (ref 4.2–5.8)

## 2009-04-05 NOTE — Progress Notes (Signed)
Quick Note:    Labs are better/normal. Continue current medications and recheck labs in 4 months.  ______

## 2009-04-08 NOTE — Progress Notes (Signed)
Quick Note:    Pt is aware of results.  ______

## 2009-04-16 NOTE — Telephone Encounter (Signed)
Patient says she began coughing yesterday and is unable to stop. ??She is not having any other symptoms. ??Would like Dr.Dodd to call something in to [facility]. ?? Please call patient if needed [phone]. ?? ??     I spoke to [facility] today, advised her she would need OV if OTC meds are not helping with her cough. She said she will arrange transportation and call back for an appointment

## 2009-04-17 MED ORDER — AZITHROMYCIN 250 MG TAB
250 mg | ORAL_TABLET | ORAL | Status: AC
Start: 2009-04-17 — End: 2009-04-22

## 2009-04-17 MED ORDER — TRAZODONE 300 MG TAB
300 mg | ORAL_TABLET | ORAL | Status: DC
Start: 2009-04-17 — End: 2009-11-12

## 2009-04-21 NOTE — Progress Notes (Signed)
Subjective       Patient presents today complaining of sinus/ nasal congestion, sore throat, and cough gradually worsening for 5 days.  Patient has had low grade fever, but no chills, nausea, vomiting, diarrhea, rash or earache.        Objective:     The patient appears well, alert, oriented x 3, in no distress.   BP 130/88   Pulse 84   Temp(Src) 98.4 ??F (36.9 ??C) (Oral)   Resp 20   Wt 242 lb (109.77 kg)  HEENT/neck: ENT - TM's clear, purulent nasal discharge, swollen nasal turbinates,  oropharynx- mild posterior cobblestoning and erythema.   Neck supple. No adenopathy  Chest: Lungs are clear,  no wheezes, rhonchi or rales.   CV: RRR without M  Abdomen: soft without tenderness, guarding, mass or organomegaly.     1. Acute upper respiratory infections of unspecified site (465.9)    2. Acute bronchitis (466.0)    3. Bipolar affective, depress, unspec (296.50)        Orders Placed This Encounter   ??? Azithromycin (zithromax) 250 mg tablet     Sig: Take 1 Tab by mouth for 5 days. Take two tablets today then one tablet daily     Dispense:  6 Tab     Refill:  0   ??? Trazodone (desyrel) 300 mg tablet     Sig: Take 1 Tab by mouth as directed.     Dispense:  30 Tab     Refill:  5   Mucinex D 1 BID  Prn cough and congestion.  Increase fluids and rest   Follow up prn symptoms not gradually improving or worsening.

## 2009-05-02 MED ORDER — OXYCODONE-ACETAMINOPHEN 5 MG-325 MG TAB
5-325 mg | ORAL_TABLET | Freq: Two times a day (BID) | ORAL | Status: DC
Start: 2009-05-02 — End: 2009-06-14

## 2009-05-02 NOTE — Progress Notes (Signed)
Subjective    Pt presents for follow up of chronic illnesses.  BS's have been under better control.  Pt is doing well on medications and denies complaints except chronic low back pain.  Pt would like to be tested for HIV as she has been sexually active without protection.    ROS:   History obtained from the patient   General: negative for - chills, fever or weight changes  HEENT: no sores in mouth, vision problems or ear problems   Respiratory: no shortness of breath, or wheezing   Cardiovascular: no chest pain, dyspnea on exertion, or edema  Gastrointestinal: no abdominal pain, change in bowel habits, or black or bloody stools            Objective  PE  The patient appears well, alert, oriented x 3, in no distress.   Pulse 84   Resp 14   Wt 240 lb (108.863 kg)  BP 120/80  HEENT - Normal  Neck supple -  No adenopathy, thyromegaly or bruits.   Chest: Lungs are clear  CV: RRR with no murmurs  Periph pulses NL.   Abdomen: soft without tenderness, guarding, mass or organomegaly.   Ext: No clicks, clubbing or edema.     Impression:  1. Lumbago (724.2)    2. Essential hypertension, benign (401.1)    3. DM w/o complication type II (250.00)    4. Contact with or exposure to other viral diseases (V01.79)        Orders Placed This Encounter   ??? Hiv-1 ab     Standing Status: Future      Number of Occurrences: 1      Standing Expiration Date: 11/02/2009   ??? Oxycodone-acetaminophen (percocet) 5-325 mg per tablet     Sig: Take 1 Tab by mouth two (2) times a day. Takes prn     Dispense:  60 Tab     Refill:  0   Continue current medications.  Diet and exercise discussed.  Pt advised to quit smoking.  Follow up in 1-2 months.

## 2009-05-04 LAB — HIV-1 RNA QL
HIV 1 Ab: NEGATIVE
HIV1 ANTIBODY,HIVR: NEGATIVE

## 2009-05-04 NOTE — Progress Notes (Signed)
Quick Note:    HIV test was negative.  ______

## 2009-05-06 NOTE — Progress Notes (Signed)
Quick Note:    Mailed copy of labs to pt.  ______

## 2009-06-14 MED ORDER — METOCLOPRAMIDE 10 MG TAB
10 mg | ORAL_TABLET | Freq: Four times a day (QID) | ORAL | Status: DC
Start: 2009-06-14 — End: 2009-12-12

## 2009-06-14 MED ORDER — OXYCODONE-ACETAMINOPHEN 5 MG-325 MG TAB
5-325 mg | ORAL_TABLET | Freq: Two times a day (BID) | ORAL | Status: DC
Start: 2009-06-14 — End: 2009-07-05

## 2009-06-14 NOTE — Progress Notes (Signed)
Subjective    Pt presents for follow up of chronic illnesses.  Pt has chronic low back pain due to DJD and DM, HTN which is controlled with meds.  Pt is doing well on medications but complains of occasional cough and intermittent lower sternal CP and ankle swelling. There is no radiation of pain, pain with exertion and pain lasts for a few seconds.  Pt denies diaphoresis and N/V.    ROS:   History obtained from the patient   General: negative for - chills, fever or weight changes  HEENT: no sores in mouth, vision problems or ear problems, occasional nasal congestion   Respiratory: no shortness of breath, or wheezing   Cardiovascular: no dyspnea on exertion, palpitations  Gastrointestinal: no abdominal pain, change in bowel habits, or black or bloody stools            Objective  PE  The patient appears well, alert, oriented x 3, in no distress. Obese  BP 130/82   Pulse 76   Resp 18   Wt 243 lb (110.224 kg)  HEENT - Normal  Neck supple -  No adenopathy, thyromegaly or bruits.   Chest: Lungs are clear  CV: RRR with no murmurs  Periph pulses NL.   Abdomen: soft without tenderness, guarding, mass or organomegaly.   Ext: No clicks, clubbing +1 ankle edema.     Impression:  1. Lumbago (724.2)    2. Edema (782.3)    3. Esophageal reflux (530.81)    4. Unspecified chest pain (786.50) - probably due to GERD       Orders Placed This Encounter   ??? Amb poc ekg routine w/ 12 leads, inter & rep   ??? Oxycodone-acetaminophen (percocet) 5-325 mg per tablet     Sig: Take 1 Tab by mouth two (2) times a day. Takes prn     Dispense:  60 Tab     Refill:  0   ??? Add Metoclopramide (reglan) 10 mg tablet     Sig: Take 0.5 Tabs by mouth four (4) times daily (with meals and at night).     Dispense:  120 Tab     Refill:  5   Continue current medications.  Diet and exercise discussed.  Pt advised to quit smoking.  Follow up in 1 month/prn.

## 2009-06-17 NOTE — Telephone Encounter (Deleted)
From April Earleen Reaper   Sent Monday June 17, 2009 ?? 2:35 PM   To [facility]   Phone [phone]   Subject Pollie Friar - Medication Rquest   Patient Mary Mayo [[id number]] (DOB: 02/17/1965)   Phone Entered Pt Home   [phone]      ??       Message Pt did not like the side effects of the Reglan Dr.Dodd prescribed on 06/14/09. ??She wants to know if Dr.Dodd can prescribe something else. ??Please call patient at [phone].     Unable to reach pt at this #

## 2009-06-17 NOTE — Telephone Encounter (Signed)
Message Pt did not like the side effects of the Reglan Dr.Dodd prescribed on 06/14/09. ??She wants to know if Dr.Dodd can prescribe something else. ??Please call patient at [phone].     Unable to contact pt at this number.

## 2009-06-25 MED ORDER — TRIAMTERENE-HYDROCHLOROTHIAZIDE 37.5 MG-25 MG TAB
ORAL_TABLET | Freq: Every day | ORAL | Status: AC | PRN
Start: 2009-06-25 — End: 2010-06-20

## 2009-06-25 NOTE — Telephone Encounter (Signed)
Have pt try Maxzide 37.5/25mg  every day prn edema #30/1-order placed.

## 2009-06-25 NOTE — Telephone Encounter (Signed)
From Priscella Mann   Sent Tuesday June 25, 2009 ??12:24 PM   To [facility]   Subject dr. dodd   Patient Mary Mayo [[id number]] (DOB: 02/17/1965)   Phone Pt Home   [phone]      ??       Message Needs something called into [facility] for fluid on her legs. ??Please advise.

## 2009-06-26 NOTE — Telephone Encounter (Signed)
Patient Mary Mayo [[id number]] (DOB: 02/17/1965)   Phone Pt Home   [phone]      ??       Message She does not want to take reglan (spelling she gave) med that was prescribed for stomach issues. ??Can something else be called into [facility].

## 2009-06-26 NOTE — Telephone Encounter (Signed)
No nothing else can be called in - will discuss at next visit.  Pt may try Zantac or Pepcid.

## 2009-06-28 NOTE — Telephone Encounter (Signed)
Patient Mary Mayo [[id number]] (DOB: 02/17/1965)   Phone Entered Pt Home   [phone]      ??       Message PMA; E.Wagner; June 28, 2009; Time: 10:39am; dob: 02/17/1965; Phone: primary ([phone]; Dr.Dodd???  The pt. went to [facility] emergency room last night for swelling in the pt???s legs and feet. The pt. would like discuss the medication that the emergency room prescribed prior to the pt. taking the medication.

## 2009-07-01 NOTE — Telephone Encounter (Signed)
Pt has an appt 07/03/09

## 2009-07-05 MED ORDER — POTASSIUM CHLORIDE SR 10 MEQ TAB, PARTICLES/CRYSTALS
10 mEq | ORAL_TABLET | Freq: Every day | ORAL | Status: DC
Start: 2009-07-05 — End: 2011-04-01

## 2009-07-05 MED ORDER — OXYCODONE-ACETAMINOPHEN 5 MG-325 MG TAB
5-325 mg | ORAL_TABLET | Freq: Two times a day (BID) | ORAL | Status: DC
Start: 2009-07-05 — End: 2009-08-14

## 2009-07-05 MED ORDER — BUMETANIDE 1 MG TAB
1 mg | ORAL_TABLET | Freq: Every day | ORAL | Status: DC | PRN
Start: 2009-07-05 — End: 2011-04-01

## 2009-07-05 NOTE — Progress Notes (Signed)
C/o lower extremity edema-sudden onset last week. Went to ED and received "some medication, I can't remember the name of it. It made my muscles hurt so bad in right arm and left leg, like a charley horse".

## 2009-07-06 NOTE — Progress Notes (Signed)
Mary Mayo presents today complaining of worsening lower extremity edema over the last week in both legs.  The patient went to [facility] ER last Wednesday about nine days ago and was given diuretic, presumably Lasix and developed cramps in the left leg and right arm and she stopped this medication.  The patient's symptoms are better in the AM after elevating the legs at night.  She denies any chest pain or shortness of breath, nausea or vomiting.  Her blood sugars have been under good control.  She has a history of lower back pain with spondylosis and is doing well on Percocet prn.  The patient also is concerned about obesity and increased weight.  Her weight has increased from 222 on July 11, 2008 to 247 on July 05, 2009.  She also has a history of hypertension and sleep apnea which are stable on medication.      Objective:  No acute distress.  Blood pressure:  142/80.  Pulse:  80.  Weight:  247 lb.  Height:  5'2".  BMI 45.18.    NECK:  Supple without lymphadenopathy or thyromegaly.  LUNGS:  Clear.  HEART:  Regular rate and rhythm without murmur.  ABDOMEN:  Soft, obese and nontender.    EXTREMITIES:  There is 2+ pitting lower extremity edema bilaterally, nontender.  No erythema.  There is +2/2 dorsalis pedis and posterior tibial pulses.    Impression:  1. Lower extremity edema, probably due to venous insufficiency.    2. Chronic low back pain.    3. Hypertension.  4. Sleep apnea.  5. Morbid obesity.    Plan:  We will have patient get TED hose 20-30 mmHG to try to control edema as well as Bumex 1 mg qd prn edema (#30/2).  When she is on this, she is to take KCl 10 mEq qd (#30/2).  Refill of Percocet is to be done on July 25 when she is due, one bid prn (#60/0).  The patient will be referred to Dr. Janina Mayo and his group for evaluation of Lap Band due to morbid obesity.  The patient is to continue her other medications which were reviewed and follow up in one month/prn.    MedDATA/gwo

## 2009-08-06 NOTE — Telephone Encounter (Signed)
Pt called requesting refill for xanax.  Would like this called in as soon as possible.  Informed pt of 24hr to 48 hr refill turnaround.  Please call pt at [phone] w/ any questions.  Call medication in to [facility].

## 2009-08-07 MED ORDER — ALPRAZOLAM 1 MG TAB
1 mg | ORAL_TABLET | Freq: Two times a day (BID) | ORAL | Status: DC
Start: 2009-08-07 — End: 2009-11-12

## 2009-08-08 NOTE — Telephone Encounter (Signed)
Called script into [facility].

## 2009-08-14 MED ORDER — VERAPAMIL ER 180 MG 24 HR CAP
180 mg | ORAL_CAPSULE | Freq: Every day | ORAL | Status: DC
Start: 2009-08-14 — End: 2009-09-13

## 2009-08-14 MED ORDER — OXYCODONE-ACETAMINOPHEN 5 MG-325 MG TAB
5-325 mg | ORAL_TABLET | Freq: Two times a day (BID) | ORAL | Status: DC | PRN
Start: 2009-08-14 — End: 2009-09-13
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
## 2009-09-10 NOTE — Telephone Encounter (Signed)
Message Pt would like to speak to Dr.Dodd. ??Pt has been having severe headache for "days" and wants to know if Dr.Dodd could suggest something otc she can take or if he can prescribe something for her. ??Says she goes to sleep and wakes up w/ a severe headache, light sensitive. ??Claims to have a family history of brain aneurisms. ??Pt says she can not come in until Friday due to her transportation situation. ??Suggested that the pt seeks emergency care but pt wants to know what Dr.Dodd wants her to do. ??Call pt at [phone]

## 2009-09-10 NOTE — Telephone Encounter (Signed)
Pt may try alleve 2 tabs BID or exedrin migraine otherwise she would need to be seen.

## 2009-09-13 ENCOUNTER — Ambulatory Visit

## 2009-09-13 MED ORDER — OXYCODONE-ACETAMINOPHEN 5 MG-325 MG TAB
5-325 mg | ORAL_TABLET | Freq: Two times a day (BID) | ORAL | Status: DC | PRN
Start: 2009-09-13 — End: 2009-10-11

## 2009-09-13 MED ORDER — VERAPAMIL ER 240 MG 24 HR CAP
240 mg | ORAL_CAPSULE | Freq: Every day | ORAL | Status: DC
Start: 2009-09-13 — End: 2011-04-01

## 2009-09-13 MED ORDER — SUMATRIPTAN 100 MG TAB
100 mg | ORAL_TABLET | Freq: Once | ORAL | Status: DC | PRN
Start: 2009-09-13 — End: 2010-05-08

## 2009-09-13 MED ORDER — DIVALPROEX 500 MG TAB, DELAYED RELEASE
500 mg | ORAL_TABLET | Freq: Two times a day (BID) | ORAL | Status: DC
Start: 2009-09-13 — End: 2011-04-01

## 2009-09-14 LAB — METABOLIC PANEL, COMPREHENSIVE
A-G Ratio: 0.8 — ABNORMAL LOW (ref 1.1–2.2)
ALT (SGPT): 33 U/L (ref 12–78)
AST (SGOT): 19 U/L (ref 15–37)
Albumin: 3.6 g/dL (ref 3.5–5.0)
Alk. phosphatase: 84 U/L (ref 50–136)
Anion gap: 7 mmol/L (ref 5–15)
BUN/Creatinine ratio: 12 (ref 12–20)
BUN: 12 MG/DL (ref 6–20)
Bilirubin, total: 0.3 MG/DL (ref 0.2–1.0)
CO2: 29 MMOL/L (ref 21–32)
Calcium: 9 MG/DL (ref 8.5–10.1)
Chloride: 101 MMOL/L (ref 97–108)
Creatinine: 1 MG/DL (ref 0.6–1.3)
GFR est AA: >60 mL/min/{1.73_m2} (ref >60)
GFR est non-AA: >60 mL/min/{1.73_m2} (ref >60)
Globulin: 4.4 g/dL — ABNORMAL HIGH (ref 2.0–4.0)
Glucose: 110 MG/DL — ABNORMAL HIGH (ref 65–100)
Potassium: 3.9 MMOL/L (ref 3.5–5.1)
Protein, total: 8 g/dL (ref 6.4–8.2)
Sodium: 137 MMOL/L (ref 136–145)

## 2009-09-14 LAB — LIPID PANEL
CHOL/HDL Ratio: 8 — ABNORMAL HIGH (ref 0–5.0)
Cholesterol, total: 216 MG/DL — ABNORMAL HIGH (ref <200)
HDL Cholesterol: 27 MG/DL
LDL, calculated: 134.4 MG/DL — ABNORMAL HIGH (ref 0–100)
Triglyceride: 273 MG/DL — ABNORMAL HIGH (ref <150)
VLDL, calculated: 54.6 MG/DL

## 2009-09-14 LAB — HEMOGLOBIN A1C WITH EAG: Hemoglobin A1c: 6.3 % — ABNORMAL HIGH (ref 4.8–6.0)

## 2009-09-14 NOTE — Progress Notes (Signed)
Mary Mayo presents today complaining of headaches which have been ongoing over the last few weeks primarily around the left eye associated with left eye tearing and mild nasal congestion.  The patient went to the ER on 09/11/09 and had a BMP done which revealed a potassium of 3.3, blood sugar 161, CT scan of the head was negative without contrast.  The patient is also here for follow up of obesity and she is having difficulty losing weight despite 1500-1800 calorie ADA diet and exercise program daily.  The patient also complains of knots under the axillary area which are irritating over the last week.  She has a history of diabetes and hypertension.  She also is here for follow up of chronic lower back pain due to degenerative disc disease with radiation into the right leg at times.  The patient denies any paresthesias, weakness, chest pain, shortness of breath, cough, dizziness, visual changes, nausea, vomiting, bowel changes, fever, chills, or increased sinus congestion.     Objective:  Obese black female, no acute distress.  Blood Pressure: 136/96.  Pulse:  84.  Weight:  241 lb.    HEENT:  Normal except for some mild boggy nasal turbinates.    NECK:  Supple without lymphadenopathy or thyromegaly.  LUNGS:  Clear.  HEART:  Regular rate and rhythm without murmur.  ABDOMEN:  Soft, nontender.  Normal bowel sounds.  EXTREMITIES:  No edema.  Full range of motion.  She has +2/2 distal pulses bilaterally.    SKIN:  Small mild erythematous cysts of the right axillary area about 1/2 cm in diameter.  NEURO:  Cranial nerves II-XII intact.  Alert and oriented x four.  Normal strength and sensation bilaterally, normal gait, normal cerebellar function.    Social History:  The patient smokes about one half pack per day.      Impression:  1. Acute headaches, probably due to cluster headaches vs migraine vs tension.  2. Hypertension - mildly elevated.   3. Lower back pain - chronic.    4. Hypertension.  5. Hypercholesterolemia.   6. Diabetes mellitus type 2 - stable.  The patient states that this is controlled at home.   7. History of bipolar disorder.   8. Cysts of the axillary region.    Plan:  1. We will check a CMP, HbA1c and FLP today.   2. Restart Depakote 500 mg one bid (#60/3) for preventative treatment of headaches, refill Percocet 5/325 mg one bid (#60/0) for low back pain, increase Verapamil to 240 mg one hs (#30/5) and given Imitrex 100 mg prn migraine or cluster headache (#9/5).    3. We discussed diet and exercise program which she is to continue.  4. She is to follow up if symptoms not improving or worsen, otherwise recheck in one month.    5. She will continue her other medications as well.    Med[facility]/gwo

## 2009-09-16 NOTE — Telephone Encounter (Signed)
Pt was seen on 09/13/09

## 2009-09-17 NOTE — Progress Notes (Signed)
Quick Note:    Labs stable/normal except very elevated cholesterol. Restart Crestor 10mg  every day #30/5 and add Slo niacin 500mg  at bedtime OTC and Low cholesterol diet and recheck in 3 months.  ______

## 2009-09-18 NOTE — Progress Notes (Signed)
Quick Note:    Results given verbally to patient over phone. Educated on foods low in cholesterol. Verbalized understanding.  ______

## 2009-10-11 MED ORDER — OXYCODONE-ACETAMINOPHEN 5 MG-325 MG TAB
5-325 mg | ORAL_TABLET | Freq: Two times a day (BID) | ORAL | Status: DC | PRN
Start: 2009-10-11 — End: 2009-11-12

## 2009-10-11 MED ORDER — FEXOFENADINE-PSEUDOEPHEDRINE SR 60 MG-120 MG 12 HR TAB
60-120 mg | ORAL_TABLET | Freq: Two times a day (BID) | ORAL | Status: AC
Start: 2009-10-11 — End: 2010-10-06

## 2009-10-12 NOTE — Progress Notes (Signed)
Subjective      Pt presents for follow up of chronic illnesses.  Pt has lost 6 lbs in 1 month with diet and exercise.  Pt is doing well on medications and denies complaints except nasal congestions and drainage due to allergies.  BS's have been controlled.  Pt has occasional sharp CP for a few seconds without radiation.    ROS:   History obtained from the patient   General: negative for - chills, fever or fatigue  HEENT: no sores in mouth, vision problems or ear problems   Respiratory: no shortness of breath, cough, or wheezing   Cardiovascular: no palpitations, dyspnea on exertion, or edema  Gastrointestinal: no abdominal pain, change in bowel habits, or black or bloody stools            Objective    PE  The patient appears well, alert, oriented x 3, in no distress.   BP 118/80   Pulse 84   Resp 20   Wt 235 lb (106.595 kg)  HEENT - Normal except boggy turbinates  Neck supple -  No adenopathy, thyromegaly or bruits.   Chest: Lungs are clear, mild L parasternal tenderness.  CV: RRR with no murmurs  Periph pulses NL.   Abdomen: soft without tenderness, guarding, mass or organomegaly.   Ext: No clicks, clubbing or edema.     Impression:  1. Essential hypertension, benign (401.1)    2. Lumbago (724.2)    3. Allergic rhinitis due to other allergen (477.8)    4. Pure hypercholesterolemia (272.0)    5. Unspecified chest pain (786.50) - probably MS         Orders Placed This Encounter   ??? Fexofenadine-pseudoephedrine (allegra-d 12 hour) 60-120 mg per tablet     Sig: Take 1 Tab by mouth two (2) times a day for 360 days.     Dispense:  60 Tab     Refill:  5   ??? Oxycodone-acetaminophen (percocet) 5-325 mg per tablet     Sig: Take 1 Tab by mouth two (2) times daily as needed for Pain.     Dispense:  60 Tab     Refill:  0   Diet and exercise discussed.        Continue current medications.  Patient expressed understanding with the diagnosis and plan.  Pt advised to quit smoking.  Follow up in 1 month.

## 2009-11-08 NOTE — Telephone Encounter (Signed)
Awaiting form to be faxed from pt's insurance Ref # [id number]

## 2009-11-11 NOTE — Telephone Encounter (Signed)
PA approved per [facility] for Allegra-D from 11/08/09-11/11/10.  Pharmacy notified

## 2009-11-12 MED ORDER — OXYCODONE-ACETAMINOPHEN 5 MG-325 MG TAB
5-325 mg | ORAL_TABLET | Freq: Two times a day (BID) | ORAL | Status: DC | PRN
Start: 2009-11-12 — End: 2009-12-12

## 2009-11-12 MED ORDER — TRAZODONE 300 MG TAB
300 mg | ORAL_TABLET | ORAL | Status: DC
Start: 2009-11-12 — End: 2010-05-21

## 2009-11-12 MED ORDER — ALPRAZOLAM 1 MG TAB
1 mg | ORAL_TABLET | Freq: Two times a day (BID) | ORAL | Status: DC
Start: 2009-11-12 — End: 2010-02-10

## 2009-11-12 MED ORDER — BUTALBITAL-ACETAMINOPHEN-CAFFEINE 50 MG-325 MG-40 MG TAB
50-325-40 mg | ORAL_TABLET | Freq: Four times a day (QID) | ORAL | Status: DC | PRN
Start: 2009-11-12 — End: 2010-01-10

## 2009-11-12 MED ORDER — OMEPRAZOLE 40 MG CAP, DELAYED RELEASE
40 mg | ORAL_CAPSULE | Freq: Every day | ORAL | Status: DC
Start: 2009-11-12 — End: 2010-11-17

## 2009-11-12 NOTE — Progress Notes (Signed)
Subjective      Pt presents for follow up of chronic illnesses.  Pt was in ER 2 weeks ago due to Migraine headache and elevated BP 200/98 and pt was treated with Fioricet which has helped fully resolve symptoms.  Pt wants to have refill and needs other medications refilled.  Pt is doing well on medications and denies new complaints.  BS's have been under good control.    ROS:   History obtained from the patient   General: negative for - chills, fever + wt gain  HEENT: no sores in mouth, vision problems or ear problems   Respiratory: no shortness of breath, cough, or wheezing   Cardiovascular: no chest pain, dyspnea on exertion, or edema  Gastrointestinal: no abdominal pain, change in bowel habits, or black or bloody stools            Objective    PE  The patient appears well, alert, oriented x 3, in no distress. Obese  BP 120/76   Pulse 100   Resp 24   Ht 5\' 2"  (1.575 m)   Wt 239 lb (108.41 kg)  HEENT - Normal  Neck supple -  No adenopathy, thyromegaly or bruits.   Chest: Lungs are clear  CV: RRR with no murmurs  Periph pulses NL.   Abdomen: soft without tenderness, guarding, mass or organomegaly.   Ext: No clicks, clubbing or edema.     Impression:  1. Mgrn w aura wo ntrc mgrn (346.00)    2. Lumbago (724.2)    3. Bipolar affective, depress, unspec (296.50)    4. Pure hypercholesterolemia (272.0)    5. DM w/o complication type II (250.00)    6. Esophageal reflux (530.81)          Orders Placed This Encounter   ??? DISCONTD: Codeine-butalbital-acetaminophen-caffeine (fioricet with codeine) 30-50-325-40 mg per capsule     Sig: Take  by mouth every four (4) hours as needed.   ??? Oxycodone-acetaminophen (percocet) 5-325 mg per tablet     Sig: Take 1 Tab by mouth two (2) times daily as needed for Pain.     Dispense:  60 Tab     Refill:  0   ??? Alprazolam (xanax) 1 mg tablet     Sig: Take 1 Tab by mouth two (2) times a day.     Dispense:  60 Tab     Refill:  2   ??? Trazodone (desyrel) 300 mg tablet      Sig: Take 1 Tab by mouth as directed.     Dispense:  30 Tab     Refill:  5   ??? Butalbital-acetaminophen-caffeine (fioricet) 50-325-40 mg per tablet     Sig: Take 1 Tab by mouth every six (6) hours as needed for Headache for 10 days.     Dispense:  20 Tab     Refill:  0   ??? Omeprazole (prilosec) 40 mg capsule     Sig: Take 1 Cap by mouth daily for 360 days.     Dispense:  30 Cap     Refill:  11   Continue current medications.  Diet and exercise discussed.  Pt advised to quit smoking.  Follow up in 1 month for labs.

## 2009-12-12 LAB — AMB POC GLUCOSE BLOOD, BY GLUCOSE MONITORING DEVICE: Glucose POC: 274

## 2009-12-12 MED ORDER — OXYCODONE-ACETAMINOPHEN 5 MG-325 MG TAB
5-325 mg | ORAL_TABLET | Freq: Two times a day (BID) | ORAL | Status: DC | PRN
Start: 2009-12-12 — End: 2010-01-10

## 2009-12-12 MED ORDER — BLOOD GLUCOSE METER KIT
PACK | Status: DC
Start: 2009-12-12 — End: 2016-01-17

## 2009-12-12 MED ORDER — LANCETS AND BLOOD GLUCOSE STRIPS COMBO PACK
Status: DC
Start: 2009-12-12 — End: 2016-01-17

## 2009-12-12 MED ORDER — TIZANIDINE 4 MG CAP
4 mg | ORAL_CAPSULE | Freq: Three times a day (TID) | ORAL | Status: DC | PRN
Start: 2009-12-12 — End: 2010-12-04

## 2009-12-13 LAB — METABOLIC PANEL, COMPREHENSIVE
A-G Ratio: 1.1 (ref 1.1–2.5)
ALT (SGPT): 28 IU/L (ref 0–40)
AST (SGOT): 25 IU/L (ref 0–40)
Albumin: 4 g/dL (ref 3.5–5.5)
Alk. phosphatase: 84 IU/L (ref 25–150)
BUN/Creatinine ratio: 18 (ref 8–27)
BUN: 16 mg/dL (ref 5–26)
Bilirubin, total: 0.3 mg/dL (ref 0.0–1.2)
CO2: 22 mmol/L (ref 20–32)
Calcium: 9.5 mg/dL (ref 8.7–10.2)
Chloride: 99 mmol/L (ref 97–108)
Creatinine: 0.9 mg/dL (ref 0.57–1.00)
GFR est AA: >59 mL/min/{1.73_m2} (ref >59)
GFR est non-AA: >59 mL/min/{1.73_m2} (ref >59)
GLOBULIN, TOTAL: 3.8 g/dL (ref 1.5–4.5)
Glucose: 262 mg/dL — ABNORMAL HIGH (ref 65–99)
Potassium: 4.6 mmol/L (ref 3.5–5.2)
Protein, total: 7.8 g/dL (ref 6.0–8.5)
Sodium: 136 mmol/L (ref 135–145)

## 2009-12-13 LAB — LIPID PANEL
Cholesterol, total: 277 mg/dL — ABNORMAL HIGH (ref 100–199)
HDL Cholesterol: 24 mg/dL — ABNORMAL LOW (ref >39)
Triglyceride: 555 mg/dL — CR (ref 0–149)

## 2009-12-13 LAB — HEMOGLOBIN A1C WITH EAG: Hemoglobin A1c: 10.5 % — ABNORMAL HIGH (ref 4.8–5.6)

## 2009-12-13 MED ORDER — INSULIN DETEMIR 100 UNIT/ML (3 ML) SUB-Q PEN
100 unit/mL (3 mL) | PACK | SUBCUTANEOUS | Status: DC
Start: 2009-12-13 — End: 2010-01-10

## 2009-12-13 NOTE — Progress Notes (Signed)
Subjective   Patient presents for follow up of chronic illnesses including Hypertension, Hypercholesterolemia and DM, Type II.  Patient went to the ER on 12/20 due to elevated BS - 399 - 474.  Pt was treated at [facility] with insulin.  Pt had stopped using Avandamet due to side effects and was prescribed Glucophage and cannot tolerate this due to abdominal pain and diarrhea.  Pt developed bruise of L upper arm due to phlebotomy.  Pt also needs refill of pain medication for chronic low back pain and would like to change muscle relaxant, Flexeril,  due to ineffectiveness.    ROS:   History obtained from the patient   General: negative for - chills, fever +malaise   HEENT: no sores in mouth, vision problems or ear problems + polydipsia  Respiratory: no shortness of breath, cough, or wheezing   Cardiovascular: no chest pain or dyspnea on exertion or edema   Gastrointestinal: no abdominal pain, change in bowel habits, or black or bloody stools   Neurological: no paresthesias, weakness or dizzyness    Objective  PE:  The patient appears well, alert, oriented x 3, in no distress. Obese  BP 120/90   Pulse 92   Resp 20   Wt 233 lb (105.688 kg)  HEENT:  within normal limits  Neck: supple. No adenopathy, thyromegaly or carotid bruits.   Chest:  Lungs are clear, good air entry, no wheezes, rhonchi or rales.   CV:  Regular rate and rhythm, no murmurs, rubs or gallops.   Abdomen: soft without tenderness, guarding, mass or organomegaly.   Ext:  No clicks, clubbing or edema  Periph pulses NL. Normal monofilament testing of feet.      Impression:  1. DM w/o complication type II, uncontrolled (250.02)    2. Lumbago (724.2)    3. Essential hypertension, benign (401.1)    4. Pure hypercholesterolemia (272.0)        Orders Placed This Encounter   ??? Metabolic panel, comprehensive   ??? Lipid panel   ??? Hemoglobin a1c   ??? Amb poc glucose blood, by glucose monitoring device   ??? Blood-glucose meter monitoring kit      Sig: by Does Not Apply route. Use as directed       Dispense:  1 Kit     Refill:  0   ??? Lancets & blood glucose strips cmpk     Sig: 1 Units by Does Not Apply route as directed. Monitor BS BID Dx: 250.02     Dispense:  100 Units     Refill:  5   ??? Tizanidine (zanaflex) 4 mg capsule     Sig: Take 1 Cap by mouth three (3) times daily as needed.     Dispense:  90 Cap     Refill:  3   ??? Oxycodone-acetaminophen (percocet) 5-325 mg per tablet     Sig: Take 1 Tab by mouth two (2) times daily as needed for Pain.     Dispense:  60 Tab     Refill:  0     Pt started on Levemir insulin 20 units subcutaneous hs and given samples and needles in office and shown how to use insulin.  Pt advised to see opthalmologist yearly.  Pt advised to quit smoking and advised to follow ADA diet and regular exercise program.  Pt is to continue medications as discussed.  Recheck in 3-4 weeks/prn if any symptoms or problems using insulin develop.

## 2009-12-16 NOTE — Progress Notes (Addendum)
Quick Note:    Follow up to discuss abnormal labs.  ______

## 2009-12-17 NOTE — Telephone Encounter (Signed)
Labs stable/normal except elevated cholesterol. Low cholesterol diet and recheck in 6 months.

## 2010-01-10 MED ORDER — INSULIN DETEMIR 100 UNIT/ML (3 ML) SUB-Q PEN
100 unit/mL (3 mL) | PACK | SUBCUTANEOUS | Status: DC
Start: 2010-01-10 — End: 2010-10-06

## 2010-01-10 MED ORDER — BUTALBITAL-ACETAMINOPHEN-CAFFEINE 50 MG-325 MG-40 MG TAB
50-325-40 mg | ORAL_TABLET | Freq: Four times a day (QID) | ORAL | Status: DC | PRN
Start: 2010-01-10 — End: 2010-02-10

## 2010-01-10 MED ORDER — CEPHALEXIN 500 MG CAP
500 mg | ORAL_CAPSULE | Freq: Three times a day (TID) | ORAL | Status: AC
Start: 2010-01-10 — End: 2010-01-20

## 2010-01-10 MED ORDER — VARENICLINE 1 MG TAB
1 mg | PACK | Freq: Two times a day (BID) | ORAL | Status: DC
Start: 2010-01-10 — End: 2011-01-02

## 2010-01-10 MED ORDER — OXYCODONE-ACETAMINOPHEN 5 MG-325 MG TAB
5-325 mg | ORAL_TABLET | Freq: Two times a day (BID) | ORAL | Status: DC | PRN
Start: 2010-01-10 — End: 2010-02-10

## 2010-01-10 MED ORDER — FENOFIBRATE 160 MG TAB
160 mg | ORAL_TABLET | Freq: Every day | ORAL | Status: DC
Start: 2010-01-10 — End: 2010-01-13

## 2010-01-12 NOTE — Progress Notes (Signed)
Subjective     Patient presents for follow up of chronic illnesses including Hypertension, Hypercholesterolemia and DM, Type II.  Patient checks BS regularly and they have been elevated over 250 fasting.  Pt needs refill of medications and also complains of lesion under R Axilla for a week.    ROS:   History obtained from the patient   General: negative for - chills, fever or malaise   HEENT: no sores in mouth, vision problems or ear problems   Respiratory: no shortness of breath, cough, or wheezing   Cardiovascular: no chest pain or dyspnea on exertion or edema   Gastrointestinal: no abdominal pain, change in bowel habits, or black or bloody stools   Neurological: no paresthesias, weakness or dizzyness    Objective    PE:  The patient appears well, alert, oriented x 3, in no distress. Obese  BP 110/78   Pulse 80   Resp 20   Wt 232 lb (105.235 kg)   Breastfeeding? No  HEENT:  within normal limits  Neck: supple. No adenopathy, thyromegaly or carotid bruits.   Chest:  Lungs are clear, good air entry, no wheezes, rhonchi or rales.   CV:  Regular rate and rhythm, no murmurs, rubs or gallops.   Abdomen: soft without tenderness, guarding, mass or organomegaly.   Ext:  No clicks, clubbing or edema  Periph pulses NL. Normal monofilament testing of feet.  Skin: + tender erythematous 1 cm boil under R axilla.      Impression:  1. DM w/o complication type II, uncontrolled (250.02)    2. Mixed hyperlipidemia (272.2)    3. Essential hypertension, benign (401.1)    4. Lumbago (724.2)    5. Tobacco use disorder (305.1)    6. Mgrn w aura wo ntrc mgrn (346.00)    7. Boil, axilla (680.3Q)          Orders Placed This Encounter   ??? Referral to ophthalmology     Referral Priority:  Routine     Referral Type:  Consultation     Referral Reason:  Specialty Services Required     Referred to Provider:  Read Graylon Gunning, MD     Requested Specialty:  Ophthalmology     Number of Visits Requested:  1    ??? Oxycodone-acetaminophen (percocet) 5-325 mg per tablet     Sig: Take 1 Tab by mouth two (2) times daily as needed for Pain.     Dispense:  60 Tab     Refill:  0   ??? Butalbital-acetaminophen-caffeine (fioricet) 50-325-40 mg per tablet     Sig: Take 1 Tab by mouth every six (6) hours as needed for Headache.     Dispense:  20 Tab     Refill:  0   ??? Insulin detemir (levemir flexpen) 100 unit/ml flexpen     Sig: by SubCUTAneous route. 45 units subcutaneous hs     Dispense:  5 Package     Refill:  5   ??? Fenofibrate 160 mg tab     Sig: Take 1 Tab by mouth daily.     Dispense:  30 Tab     Refill:  5   ??? Varenicline (chantix continuing month pak) 1 mg tablet     Sig: Take 1 Tab by mouth two (2) times a day. Continuation month pack     Dispense:  1 Package     Refill:  2   ??? Cephalexin (keflex) 500 mg capsule  Sig: Take 1 Cap by mouth three (3) times daily for 10 days.     Dispense:  30 Cap     Refill:  0       Increase Levemir to 40-45 units to keep fasting BS less than 120.  Pt advised to see opthalmologist yearly.  Pt advised to quit smoking and advised to follow ADA diet and regular exercise program.  Pt is to continue medications as discussed.  Recheck in 1 month.

## 2010-01-13 MED ORDER — FENOFIBRATE NANOCRYSTALLIZED 145 MG TAB
145 mg | ORAL_TABLET | Freq: Every day | ORAL | Status: DC
Start: 2010-01-13 — End: 2010-09-01

## 2010-01-13 NOTE — Telephone Encounter (Signed)
Information change sent back to pharmacy per dr dodd along with e-script that followed. Thanks.

## 2010-02-10 MED ORDER — ALPRAZOLAM 1 MG TAB
1 mg | ORAL_TABLET | Freq: Two times a day (BID) | ORAL | Status: DC
Start: 2010-02-10 — End: 2010-05-09

## 2010-02-10 MED ORDER — OXYCODONE-ACETAMINOPHEN 5 MG-325 MG TAB
5-325 mg | ORAL_TABLET | Freq: Two times a day (BID) | ORAL | Status: DC | PRN
Start: 2010-02-10 — End: 2010-03-10

## 2010-02-10 MED ORDER — BUTALBITAL-ACETAMINOPHEN-CAFFEINE 50 MG-325 MG-40 MG TAB
50-325-40 mg | ORAL_TABLET | Freq: Four times a day (QID) | ORAL | Status: DC | PRN
Start: 2010-02-10 — End: 2010-07-07

## 2010-02-10 NOTE — Progress Notes (Signed)
Subjective      Pt presents for follow up of chronic illnesses.  Pt is doing well on medications but complains of persistent L sided sharp headache for 1 week.    Pt has also significantly decreased smoking due to Chantix.    ROS:   History obtained from the patient   General: negative for - chills, fever or weight changes  HEENT: no sores in mouth, vision problems or ear problems   Respiratory: no shortness of breath, cough, or wheezing   Cardiovascular: no chest pain, dyspnea on exertion, or edema  Gastrointestinal: no abdominal pain, change in bowel habits, or black or bloody stools            Objective    PE  The patient appears well, alert, oriented x 3, in no distress. Obese  BP 110/82   Pulse 84   Resp 20   Wt 230 lb (104.327 kg)  HEENT - Normal except boggy turbinates.  Neck supple -  No adenopathy, thyromegaly or bruits.   Chest: Lungs are clear  CV: RRR with no murmurs  Periph pulses NL.   Abdomen: soft without tenderness, guarding, mass or organomegaly.   Ext: No clicks, clubbing or edema. Neuro - +2/4 DTR's LE's, 5/5 strength and normal sensation to LT/PP LE's and negative SLR to 90 degrees.     Impression:  1. Lumbago (724.2)    2. Bipolar affective, depress, unspec (296.50)    3. Essential hypertension, benign (401.1)    4. Mgrn w aura wo ntrc mgrn (346.00)    5. Mixed hyperlipidemia (272.2)          Orders Placed This Encounter   ??? Oxycodone-acetaminophen (percocet) 5-325 mg per tablet     Sig: Take 1 Tab by mouth two (2) times daily as needed for Pain.     Dispense:  60 Tab     Refill:  0   ??? Alprazolam (xanax) 1 mg tablet     Sig: Take 1 Tab by mouth two (2) times a day.     Dispense:  60 Tab     Refill:  2   ??? Rosuvastatin (crestor) 10 mg tablet     Sig: Take 10 mg by mouth daily.   ??? Butalbital-acetaminophen-caffeine (fioricet) 50-325-40 mg per tablet     Sig: Take 1 Tab by mouth every six (6) hours as needed for Headache.     Dispense:  20 Tab     Refill:  0   Diet and exercise discussed.         Continue current medications.  Patient expressed understanding with the diagnosis and plan.  Follow up in 1 month for fasting labs.

## 2010-03-05 MED ORDER — CLONIDINE 0.2 MG TAB
0.2 mg | ORAL_TABLET | Freq: Two times a day (BID) | ORAL | Status: DC
Start: 2010-03-05 — End: 2011-03-16

## 2010-03-10 MED ORDER — OXYCODONE-ACETAMINOPHEN 5 MG-325 MG TAB
5-325 mg | ORAL_TABLET | Freq: Two times a day (BID) | ORAL | Status: DC | PRN
Start: 2010-03-10 — End: 2010-04-09

## 2010-03-10 MED ORDER — PHENTERMINE 30 MG CAP
30 mg | ORAL_CAPSULE | ORAL | Status: DC
Start: 2010-03-10 — End: 2010-04-09

## 2010-03-10 NOTE — Progress Notes (Signed)
Subjective     Patient presents for follow up of chronic illnesses including Hypertension, Hypercholesterolemia and DM, Type II.  Patient checks BS regularly and they have been under good control.  Patient would like medication to help her loose weight. Pt has been on diet and exercise program without much success.    ROS:   History obtained from the patient   General: negative for - chills, fever or malaise   HEENT: no sores in mouth, vision problems or ear problems   Respiratory: no shortness of breath, cough, or wheezing   Cardiovascular: no chest pain or dyspnea on exertion or edema   Gastrointestinal: no abdominal pain, change in bowel habits, or black or bloody stools   Neurological: no paresthesias, weakness or dizzyness    Objective    PE:  The patient appears well, alert, oriented x 3, in no distress.   BP 110/80   Pulse 84   Wt 234 lb (106.142 kg)   SpO2 98%  HEENT:  within normal limits  Neck: supple. No adenopathy, thyromegaly or carotid bruits.   Chest:  Lungs are clear, good air entry, no wheezes, rhonchi or rales.   CV:  Regular rate and rhythm, no murmurs, rubs or gallops.   Abdomen: soft without tenderness, guarding, mass or organomegaly.   Ext:  No clicks, clubbing or edema  Periph pulses NL. Normal monofilament testing of feet.      Impression:  1. Essential hypertension, benign (401.1)    2. DM w/o complication type II (250.00)    3. Morbid obesity (278.01)    4. Mixed hyperlipidemia (272.2)    5. Lumbago (724.2)          Orders Placed This Encounter   ??? Metabolic panel, comprehensive   ??? Lipid panel   ??? Hemoglobin a1c   ??? Microalbumin, ur, rand   ??? Tsh, 3rd generation   ??? Oxycodone-acetaminophen (percocet) 5-325 mg per tablet     Sig: Take 1 Tab by mouth two (2) times daily as needed for Pain.     Dispense:  60 Tab     Refill:  0   ??? Phentermine 30 mg capsule     Sig: Take 1 Cap by mouth every morning.     Dispense:  30 Cap     Refill:  0         Pt advised to see opthalmologist yearly.   Pt is cutting down on smoking and advised to follow ADA diet and regular exercise program.  Pt is to continue medications as discussed.  Recheck in 3-4 months.

## 2010-03-10 NOTE — Progress Notes (Signed)
Pt  Here for a 1 month check up, and to receive refills on her prescriptions.

## 2010-03-11 LAB — METABOLIC PANEL, COMPREHENSIVE
A-G Ratio: 1.2 (ref 1.1–2.5)
ALT (SGPT): 17 IU/L (ref 0–40)
AST (SGOT): 18 IU/L (ref 0–40)
Albumin: 3.8 g/dL (ref 3.5–5.5)
Alk. phosphatase: 64 IU/L (ref 25–150)
BUN/Creatinine ratio: 21 (ref 9–23)
BUN: 17 mg/dL (ref 6–24)
Bilirubin, total: 0.2 mg/dL (ref 0.0–1.2)
CO2: 24 mmol/L (ref 20–32)
Calcium: 8.9 mg/dL (ref 8.7–10.2)
Chloride: 104 mmol/L (ref 97–108)
Creatinine: 0.81 mg/dL (ref 0.57–1.00)
GFR est AA: >59 mL/min/{1.73_m2} (ref >59)
GFR est non-AA: >59 mL/min/{1.73_m2} (ref >59)
GLOBULIN, TOTAL: 3.3 g/dL (ref 1.5–4.5)
Glucose: 155 mg/dL — ABNORMAL HIGH (ref 65–99)
Potassium: 4.2 mmol/L (ref 3.5–5.2)
Protein, total: 7.1 g/dL (ref 6.0–8.5)
Sodium: 138 mmol/L (ref 135–145)

## 2010-03-11 LAB — LIPID PANEL
Cholesterol, total: 227 mg/dL — ABNORMAL HIGH (ref 100–199)
HDL Cholesterol: 31 mg/dL — ABNORMAL LOW (ref >39)
LDL, calculated: 163 mg/dL — ABNORMAL HIGH (ref 0–99)
Triglyceride: 167 mg/dL — ABNORMAL HIGH (ref 0–149)
VLDL, calculated: 33 mg/dL (ref 5–40)

## 2010-03-11 LAB — HEMOGLOBIN A1C WITH EAG: Hemoglobin A1c: 7.4 % — ABNORMAL HIGH (ref 4.8–5.6)

## 2010-03-11 LAB — TSH 3RD GENERATION: TSH: 2.38 u[IU]/mL (ref 0.450–4.500)

## 2010-03-12 MED ORDER — ROSUVASTATIN 20 MG TAB
20 mg | ORAL_TABLET | Freq: Every day | ORAL | Status: DC
Start: 2010-03-12 — End: 2010-03-17

## 2010-03-12 MED ORDER — NIACIN ER 500 MG TAB (NIASPAN)
500 mg | ORAL_TABLET | Freq: Every evening | ORAL | Status: DC
Start: 2010-03-12 — End: 2010-03-17

## 2010-03-12 NOTE — Progress Notes (Addendum)
Quick Note:    BS is still mildly elevated but better. Cholesterol is still high. Increase Crestor to 20mg  every day #30/5 and add Niaspan 500mg  2 tabs hs. #60/5. Increase Lantus by 3 units every 3 days until fasting BS is less than 130 consistently. Will recheck patient in 1 month.  ______

## 2010-03-12 NOTE — Telephone Encounter (Signed)
BS is still mildly elevated but better. Cholesterol is still high. Increase Crestor to 20mg  every day #30/5 and add Niaspan 500mg  2 tabs hs. #60/5. Increase Lantus by 3 units every 3 days until fasting BS is less than 130 consistently. Will recheck patient in 1 month.

## 2010-03-12 NOTE — Progress Notes (Addendum)
Quick Note:    Letter sent    ______

## 2010-03-13 NOTE — Telephone Encounter (Signed)
Letter sent - I read all info to her.

## 2010-03-17 MED ORDER — ATORVASTATIN 40 MG TAB
40 mg | ORAL_TABLET | Freq: Every day | ORAL | Status: DC
Start: 2010-03-17 — End: 2010-09-01

## 2010-03-17 NOTE — Telephone Encounter (Signed)
Pt aware.

## 2010-03-17 NOTE — Telephone Encounter (Signed)
She had allergic reaction to niaspan and her insurance will not cover crestor. What do you want to do? Thanks

## 2010-03-17 NOTE — Telephone Encounter (Signed)
Give her Lipitor 40mg  every day #30/3 and will recheck levels in 2-3 months.

## 2010-04-09 MED ORDER — OXYCODONE-ACETAMINOPHEN 5 MG-325 MG TAB
5-325 mg | ORAL_TABLET | Freq: Two times a day (BID) | ORAL | Status: DC | PRN
Start: 2010-04-09 — End: 2010-05-08

## 2010-04-09 MED ORDER — PHENTERMINE 30 MG CAP
30 mg | ORAL_CAPSULE | ORAL | Status: DC
Start: 2010-04-09 — End: 2010-06-06

## 2010-04-09 NOTE — Progress Notes (Signed)
Subjective     Patient presents for follow up of chronic illnesses including Hypertension, Hypercholesterolemia and DM, Type II.  Patient checks BS regularly and they have been under good control.  Patient has lost 6 lbs with diet and medication.  Pt has decreased tobacco use to 1/2 ppd.  Pt also complains of daily generalized headaches which are not controlled with Fioricet.    ROS:   History obtained from the patient   General: negative for - chills, fever or malaise   HEENT: no sores in mouth, vision problems or ear problems   Respiratory: no shortness of breath, cough, or wheezing   Cardiovascular: no chest pain or dyspnea on exertion or edema   Gastrointestinal: no abdominal pain, change in bowel habits, or black or bloody stools   Neurological: no paresthesias, weakness or dizzyness    Objective    PE:  The patient appears well, alert, oriented x 3, in no distress. Morbidly Obese  BP 102/78   Pulse 88   Resp 20   Ht 5\' 2"  (1.575 m)   Wt 228 lb (103.42 kg)   BMI 41.70 kg/m2  HEENT:  within normal limits except pale boggy turbinates  Neck: supple. No adenopathy, thyromegaly or carotid bruits.   Chest:  Lungs are clear, good air entry, no wheezes, rhonchi or rales.   CV:  Regular rate and rhythm, no murmurs, rubs or gallops.   Abdomen: soft without tenderness, guarding, mass or organomegaly.   Ext:  No clicks, clubbing or edema  Periph pulses NL. Normal monofilament testing of feet.  Neuro: CN 2-12 intact, Normal strength and sensation B, +2/4 DTR's bilaterally, Neg Rhomberg, normal gait and cerebellar function.       Impression:  1. Essential hypertension, benign (401.1)    2. DM w/o complication type II, uncontrolled (250.02)    3. Mixed hyperlipidemia (272.2)    4. Morbid obesity (278.01)    5. Allergic rhinitis due to other allergen (477.8)    6. Lumbago (724.2)    7. Chronic headaches ([id number].[place].Gold)          Orders Placed This Encounter   ??? Referral to neurology     Referral Priority:  Routine      Referral Type:  Consultation     Referral Reason:  Specialty Services Required     Referred to Provider:  Stanford Scotland, MD     Requested Specialty:  Neurology     Number of Visits Requested:  6   ??? Phentermine 30 mg capsule     Sig: Take 1 Cap by mouth every morning.     Dispense:  30 Cap     Refill:  0   ??? Oxycodone-acetaminophen (percocet) 5-325 mg per tablet     Sig: Take 1 Tab by mouth two (2) times daily as needed for Pain.     Dispense:  60 Tab     Refill:  0       Pt advised to see opthalmologist yearly.  Pt advised to quit smoking and advised to follow ADA diet and regular exercise program.  Pt is to continue medications as discussed.  Recheck in 1 month.

## 2010-05-08 MED ORDER — SUMATRIPTAN 100 MG TAB
100 mg | ORAL_TABLET | Freq: Once | ORAL | Status: DC | PRN
Start: 2010-05-08 — End: 2011-04-01

## 2010-05-08 MED ORDER — OXYCODONE-ACETAMINOPHEN 5 MG-325 MG TAB
5-325 mg | ORAL_TABLET | Freq: Two times a day (BID) | ORAL | Status: DC | PRN
Start: 2010-05-08 — End: 2010-06-06

## 2010-05-08 NOTE — Progress Notes (Signed)
Subjective    Pt presents for follow up of chronic illnesses.  Pt is doing well on medications but still has frequent headaches which are L occipital region and throbbing and associated with nausea and photophobia and phonophobia.  Pt has been on Topamax and is on Verapamil and Depakote without control.  Pt denies any specific triggers or other neurological deficits such as visual changes, paresthesias or weakness.  Pt is due for Pap and pelvic exam and BS's have been under good control.    ROS:   History obtained from the patient   General: negative for - chills, fever  + 1 lb wt loss with diet and exercise  HEENT: no sores in mouth, vision problems or ear problems   Respiratory: no shortness of breath, cough, or wheezing   Cardiovascular: no chest pain, dyspnea on exertion, or edema  Gastrointestinal: no abdominal pain, change in bowel habits, or black or bloody stools            Objective  PE  The patient appears well, alert, oriented x 3, in no distress.   BP 122/88   Pulse 80   Resp 20   Ht 5\' 2"  (1.575 m)   Wt 227 lb (102.967 kg)   BMI 41.52 kg/m2  HEENT - Normal  Neck supple -  No adenopathy, thyromegaly or bruits.   Chest: Lungs are clear  CV: RRR with no murmurs  Periph pulses NL.   Abdomen: soft without tenderness, guarding, mass or organomegaly.   Ext: No clicks, clubbing or edema.   Neuro: CN 2-12 intact, Normal strength and sensation B, +2/4 DTR's bilaterally, Neg Rhomberg, normal gait and cerebellar function.     Impression:  1. Lumbago (724.2)    2. Essential hypertension, benign (401.1)    3. DM w/o complication type II (250.00)    4. Mixed hyperlipidemia (272.2)    5. Mgrn w aura wo ntrc mgrn (346.00)        Orders Placed This Encounter   ??? Oxycodone-acetaminophen (percocet) 5-325 mg per tablet     Sig: Take 1 Tab by mouth two (2) times daily as needed for Pain.     Dispense:  60 Tab     Refill:  0   ??? Sumatriptan (imitrex) 100 mg tablet      Sig: Take 1 Tab by mouth once as needed for Migraine. May repeat after 2 hours if needed     Dispense:  9 Tab     Refill:  5   Diet and exercise discussed.        Continue current medications.  Patient expressed understanding with the diagnosis and plan.  Pt urged to quit smoking.  Follow up in 1 month for PAP and fasting labs.

## 2010-05-09 MED ORDER — ALPRAZOLAM 1 MG TAB
1 mg | ORAL_TABLET | Freq: Two times a day (BID) | ORAL | Status: DC
Start: 2010-05-09 — End: 2010-08-06

## 2010-05-21 MED ORDER — TRAZODONE 300 MG TAB
300 mg | ORAL_TABLET | Freq: Every evening | ORAL | Status: DC
Start: 2010-05-21 — End: 2010-11-05

## 2010-06-06 MED ORDER — OXYCODONE-ACETAMINOPHEN 5 MG-325 MG TAB
5-325 mg | ORAL_TABLET | Freq: Two times a day (BID) | ORAL | Status: DC | PRN
Start: 2010-06-06 — End: 2010-07-07

## 2010-06-06 MED ORDER — TRIMETHOPRIM-SULFAMETHOXAZOLE 160 MG-800 MG TAB
160-800 mg | ORAL_TABLET | Freq: Two times a day (BID) | ORAL | Status: AC
Start: 2010-06-06 — End: 2010-06-16

## 2010-06-08 NOTE — Progress Notes (Signed)
Subjective    Pt presents for follow up of chronic illnesses.  BS's have been under good control at home.  Pt needs refills.  Pt is doing well on medications and denies complaints except lesion under R axilla for several days.    ROS:   History obtained from the patient   General: negative for - chills, fever +4 lb wt gain since last visit.  HEENT: no sores in mouth, vision problems or ear problems   Respiratory: no shortness of breath, cough, or wheezing   Cardiovascular: no chest pain, dyspnea on exertion, or edema  Gastrointestinal: no abdominal pain, change in bowel habits, or black or bloody stools            Objective  PE  The patient appears well, alert, oriented x 3, in no distress. Morbidly obese  BP 106/78   Pulse 76   Resp 20   Ht 5\' 2"  (1.575 m)   Wt 231 lb (104.781 kg)   BMI 42.25 kg/m2  HEENT - Normal  Neck supple -  No adenopathy, thyromegaly or bruits.   Chest: Lungs are clear  CV: RRR with no murmurs  Periph pulses NL.   Abdomen: soft without tenderness, guarding, mass or organomegaly.   Ext: No clicks, clubbing or edema.   Back: lower lumbar tenderness, mild decreased ROM  Skin: + R axilla 1 cm erythematous tender boil     Impression/Plan:  1. DM w/o complication type II (250.00)     2. Lumbago (724.2)  oxycodone-acetaminophen (PERCOCET) 5-325 mg per tablet   3. Essential hypertension, benign (401.1)     4. Mixed hyperlipidemia (272.2)     5. Boil of skin and subcutaneous tissue (680.9C)  trimethoprim-sulfamethoxazole (BACTRIM DS) 160-800 mg per tablet   Diet and exercise discussed.        Continue current medications.        Patient expressed understanding with the diagnosis and plan.  Pt advised to quit smoking.   Follow up in 1 months for fasting labs.

## 2010-07-07 MED ORDER — OXYCODONE-ACETAMINOPHEN 5 MG-325 MG TAB
5-325 mg | ORAL_TABLET | Freq: Two times a day (BID) | ORAL | Status: DC | PRN
Start: 2010-07-07 — End: 2010-08-06

## 2010-07-07 NOTE — Progress Notes (Signed)
Subjective    Pt presents for follow up of chronic illnesses.  BS's have been under good control.  Pt is doing well on medications and denies complaints.    ROS:   History obtained from the patient   General: negative for - chills, fever or weight changes  HEENT: no sores in mouth, vision problems or ear problems   Respiratory: no shortness of breath, cough, or wheezing   Cardiovascular: no chest pain, dyspnea on exertion, or edema  Gastrointestinal: no abdominal pain, change in bowel habits, or black or bloody stools            Objective  PE  The patient appears well, alert, oriented x 3, in no distress. Morbidly obese  BP 108/72   Pulse 88   Resp 24   Ht 5\' 2"  (1.575 m)   Wt 227 lb (102.967 kg)   BMI 41.52 kg/m2  HEENT - Normal  Neck supple -  No adenopathy, thyromegaly or bruits.   Chest: Lungs are clear  CV: RRR with no murmurs  Periph pulses NL.   Abdomen: soft without tenderness, guarding, mass or organomegaly.   Ext: No clicks, clubbing or edema.     Impression/Plan:  1. Essential hypertension, benign (401.1)  METABOLIC PANEL, COMPREHENSIVE   2. Lumbago (724.2)  oxycodone-acetaminophen (PERCOCET) 5-325 mg per tablet   3. DM w/o complication type II (250.00)  METABOLIC PANEL, COMPREHENSIVE, HEMOGLOBIN A1C   4. Mixed hyperlipidemia (272.2)  LIPID PANEL   5. Bipolar affective, depress, unspec (296.50)  DRUG PROFILE, UR, 9 DRUGS   6. Encounter for long-term (current) use of other medications (V58.69)  DRUG PROFILE, UR, 9 DRUGS   Continue current medications.  Diet and exercise discussed.  Pt advised to quit smoking.  Follow up in 1 month/prn.

## 2010-07-29 NOTE — Telephone Encounter (Signed)
Stung by wasp on Sunday on her lt arm. She says it still hurts and itches. I told her to take tylenol and benadryl if needed. If it gets too bad call and make an appt. She said ok.

## 2010-08-06 MED ORDER — OXYCODONE-ACETAMINOPHEN 5 MG-325 MG TAB
5-325 mg | ORAL_TABLET | Freq: Two times a day (BID) | ORAL | Status: DC | PRN
Start: 2010-08-06 — End: 2010-09-01

## 2010-08-06 MED ORDER — ALPRAZOLAM 1 MG TAB
1 mg | ORAL_TABLET | Freq: Two times a day (BID) | ORAL | Status: DC
Start: 2010-08-06 — End: 2010-11-05

## 2010-08-06 MED ORDER — BUPROPION XL 150 MG 24 HR TAB
150 mg | ORAL_TABLET | ORAL | Status: DC
Start: 2010-08-06 — End: 2010-10-06

## 2010-08-09 NOTE — Progress Notes (Signed)
Subjective   Patient presents for follow up of chronic illnesses including Hypertension, Hypercholesterolemia and DM, Type II.  Patient checks BS regularly and they have been under good control.  Patient has no specific complaints today except for blurred vision.  ROS:   History obtained from the patient   General: negative for - chills, fever or malaise   HEENT: no sores in mouth, vision problems or ear problems   Respiratory: no shortness of breath, cough, or wheezing   Cardiovascular: no chest pain or dyspnea on exertion or edema   Gastrointestinal: no abdominal pain, change in bowel habits, or black or bloody stools   Neurological: no paresthesias, weakness or dizzyness    Objective  PE:  The patient appears well, alert, oriented x 3, in no distress. Morbidly obese  BP 106/80   Pulse 88   Resp 20   Ht 5\' 2"  (1.575 m)   Wt 230 lb (104.327 kg)   BMI 42.07 kg/m2  HEENT:  within normal limits  Neck: supple. No adenopathy, thyromegaly or carotid bruits.   Chest:  Lungs are clear, good air entry, no wheezes, rhonchi or rales.   CV:  Regular rate and rhythm, no murmurs, rubs or gallops.   Abdomen: soft without tenderness, guarding, mass or organomegaly.   Ext:  No clicks, clubbing or edema  Periph pulses NL.   Neuro: WNL, Normal monofilament testing of feet.  Skin: warm and dry without lesions  Back: + lower lumbar and para lumbar tenderness with limited ROM.       Impression:  1. Lumbago (724.2)    2. Bipolar affective, depress, unspec (296.50)    3. Essential hypertension, benign (401.1)    4. DM w/o complication type II (250.00)    5. Blurred vision, bilateral (368.8BL)    6. Dysthymic disorder (300.4)    7. Unspecified Tachycardia (785.0)    8. Mixed hyperlipidemia (272.2)        Orders Placed This Encounter   ??? Metabolic panel, comprehensive   ??? Lipid panel   ??? Cbc w/o diff   ??? Hemoglobin a1c   ??? Drug profile, blood (7 drugs)   ??? Referral to ophthalmology     Referral Priority:  Routine      Referral Type:  Consultation     Referral Reason:  Specialty Services Required     Referred to Provider:  Idelle Crouch, MD     Requested Specialty:  Ophthalmology     Number of Visits Requested:  3   ??? Oxycodone-acetaminophen (percocet) 5-325 mg per tablet     Sig: Take 1 Tab by mouth two (2) times daily as needed for Pain.     Dispense:  60 Tab     Refill:  0   ??? Alprazolam (xanax) 1 mg tablet     Sig: Take 1 Tab by mouth two (2) times a day.     Dispense:  60 Tab     Refill:  2   ??? Bupropion xl (wellbutrin xl) 150 mg tablet     Sig: Take 1 Tab by mouth every morning.     Dispense:  30 Tab     Refill:  0       Pt advised to see opthalmologist.  Pt does not smoke and advised to follow ADA diet and regular exercise program.  Pt is to continue medications as discussed.  Recheck in 1 month/prn.

## 2010-08-12 LAB — LIPID PANEL
Cholesterol, total: 281 mg/dL — ABNORMAL HIGH (ref 100–199)
HDL Cholesterol: 31 mg/dL — ABNORMAL LOW (ref >39)
Triglyceride: 465 mg/dL — ABNORMAL HIGH (ref 0–149)

## 2010-08-12 LAB — METABOLIC PANEL, COMPREHENSIVE
A-G Ratio: 1.2 (ref 1.1–2.5)
ALT (SGPT): 29 IU/L (ref 0–40)
AST (SGOT): 25 IU/L (ref 0–40)
Albumin: 4.1 g/dL (ref 3.5–5.5)
Alk. phosphatase: 72 IU/L (ref 25–150)
BUN/Creatinine ratio: 24 — ABNORMAL HIGH (ref 9–23)
BUN: 21 mg/dL (ref 6–24)
Bilirubin, total: 0.2 mg/dL (ref 0.0–1.2)
CO2: 22 mmol/L (ref 20–32)
Calcium: 9 mg/dL (ref 8.7–10.2)
Chloride: 100 mmol/L (ref 97–108)
Creatinine: 0.89 mg/dL (ref 0.57–1.00)
GFR est AA: 89 mL/min/{1.73_m2} (ref >59)
GFR est non-AA: 77 mL/min/{1.73_m2} (ref >59)
GLOBULIN, TOTAL: 3.4 g/dL (ref 1.5–4.5)
Glucose: 144 mg/dL — ABNORMAL HIGH (ref 65–99)
Potassium: 4.3 mmol/L (ref 3.5–5.2)
Protein, total: 7.5 g/dL (ref 6.0–8.5)
Sodium: 136 mmol/L (ref 135–145)

## 2010-08-12 LAB — CBC W/O DIFF
HCT: 40.4 % (ref 34.0–44.0)
HGB: 13 g/dL (ref 11.5–15.0)
MCH: 29.7 pg (ref 27.0–34.0)
MCHC: 32.2 g/dL (ref 32.0–36.0)
MCV: 92 fL (ref 80–98)
PLATELET: 192 10*3/uL (ref 140–415)
RBC: 4.37 x10E6/uL (ref 3.80–5.10)
RDW: 14.4 % (ref 11.7–15.0)
WBC: 7.6 10*3/uL (ref 4.0–10.5)

## 2010-08-12 LAB — DRUG PROFILE, BLOOD (7 DRUGS)
Amphetamines Screen, Blood: NEGATIVE ng/mL
Barbiturates Screen, Blood: NEGATIVE ng/mL
Benzodiazepines Screen, Blood: NEGATIVE ng/mL
Cannabinoid: POSITIVE — AB
Carboxy THC (GC/MS): 8 ng/mL
Carboxy-THC: POSITIVE — AB
Cocaine + Metab. Screen, Blood: NEGATIVE ng/mL
Opiates Screen, Blood: NEGATIVE ng/mL
Phencyclidine: NEGATIVE ng/mL
THC: NEGATIVE

## 2010-08-12 LAB — HEMOGLOBIN A1C WITH EAG: Hemoglobin A1c: 7.5 % — ABNORMAL HIGH (ref 4.8–5.6)

## 2010-08-15 NOTE — Progress Notes (Addendum)
Quick Note:    Blood sugar and triglycerides were elevated. Also drug screen was abnormal. Please follow up to discuss.  ______

## 2010-08-18 NOTE — Telephone Encounter (Signed)
[facility] 08/18/10 4:00pm [email or web address] Dr. Pollie Friar DOB [birth year] C- [phone]. Pt would like a call regarding the blood work from 2 weeks ago.

## 2010-08-19 NOTE — Telephone Encounter (Signed)
Called and advised pt. Labs are abnormal. Pt. To schedule follow up appt. Mary Mayo

## 2010-09-01 MED ORDER — ROSUVASTATIN 10 MG TAB
10 mg | ORAL_TABLET | Freq: Every day | ORAL | Status: DC
Start: 2010-09-01 — End: 2010-10-06

## 2010-09-01 MED ORDER — OXYCODONE-ACETAMINOPHEN 5 MG-325 MG TAB
5-325 mg | ORAL_TABLET | Freq: Two times a day (BID) | ORAL | Status: DC | PRN
Start: 2010-09-01 — End: 2010-10-06

## 2010-09-01 MED ORDER — FENOFIBRATE NANOCRYSTALLIZED 145 MG TAB
145 mg | ORAL_TABLET | Freq: Every day | ORAL | Status: DC
Start: 2010-09-01 — End: 2010-09-16

## 2010-09-01 NOTE — Progress Notes (Addendum)
Subjective   Patient presents for follow up of chronic illnesses including Hypertension, Hypercholesterolemia and DM, Type II.  Patient checks BS regularly and they have been under better control.  Recent labs discussed with patient.   Patient complains of upper abdominal pain associated with nausea off and on for 1 month.    ROS:   History obtained from the patient   General: negative for - chills, fever or malaise   HEENT: no sores in mouth, vision problems or ear problems, + nasal congestion and postnasal drip  Respiratory: no shortness of breath, cough, or wheezing   Cardiovascular: no chest pain or dyspnea on exertion or edema   Gastrointestinal: no change in bowel habits, or black or bloody stools   Neurological: no paresthesias, weakness or dizzyness    Objective  PE:  The patient appears well, alert, oriented x 3, in no distress. Morbidly obese  BP 122/70   Pulse 100   Resp 20   Ht 5\' 2"  (1.575 m)   Wt 233 lb 3.2 oz (105.779 kg)   BMI 42.65 kg/m2  HEENT:  within normal limits except boggy nasal turbinates.  Neck: supple. No adenopathy, thyromegaly or carotid bruits.   Chest:  Lungs are clear, good air entry, no wheezes, rhonchi or rales.   CV:  Regular rate and rhythm, no murmurs, rubs or gallops.   Abdomen: soft with mild RUQ tenderness, no guarding, mass or organomegaly.   Ext:  No clicks, clubbing or edema  Periph pulses NL. Normal monofilament testing of feet.  Back: + lower lumbar and para lumbar tenderness with limited ROM.       Impression:  1. DM w/o complication type II, uncontrolled (250.02)    2. Lumbago (724.2)    3. Essential hypertension, benign (401.1)    4. Mixed hyperlipidemia (272.2)    5. Abdominal pain, right upper quadrant (789.01)    6. Nausea alone (787.02)    7. Allergic rhinitis due to other allergen (477.8)        Orders Placed This Encounter   ??? US abdomen complete     Standing Status: Future      Number of Occurrences:       Standing Expiration Date: 03/02/2011      Order Specific Question:  Reason for Exam     Answer:  RUQ pain     Order Specific Question:  Is Patient Pregnant?     Answer:  No   ??? Oxycodone-acetaminophen (percocet) 5-325 mg per tablet     Sig: Take 1 Tab by mouth two (2) times daily as needed for Pain. Do not fill until 09/05/10.     Dispense:  60 Tab     Refill:  0   ??? Fenofibrate nanocrystallized (tricor) 145 mg tablet     Sig: Take 1 Tab by mouth daily for 360 days.     Dispense:  30 Tab     Refill:  5   ??? Rosuvastatin (crestor) 10 mg tablet     Sig: Take 1 Tab by mouth daily.     Dispense:  30 Tab     Refill:  5     Nasonex NS as directed prn #2  Pt advised to see opthalmologist yearly.  Pt does not smoke and advised to follow ADA low fat diet and regular exercise program.  Pt is to continue medications as discussed.  Recheck in 1 month/prn.

## 2010-09-08 NOTE — Progress Notes (Signed)
Quick Note:    Patient has fatty liver but no gallstones on [place].  ______

## 2010-09-09 NOTE — Telephone Encounter (Signed)
The patient would like to receive ultrasound results, please call. Thanks.

## 2010-09-09 NOTE — Progress Notes (Signed)
Quick Note:    Mailed letter to pt.  ______

## 2010-09-10 NOTE — Telephone Encounter (Signed)
Patient has fatty liver but no gallstones on [place]. The ultrasound was normal. You can diet to reduce the fatty liver but you would have to talk to Dr Pollie Friar.

## 2010-09-16 MED ORDER — FENOFIBRATE 160 MG TAB
160 mg | ORAL_TABLET | Freq: Every day | ORAL | Status: DC
Start: 2010-09-16 — End: 2010-10-06

## 2010-09-16 NOTE — Telephone Encounter (Signed)
Her insurance will not cover tricor 145mg . It will cover fenofibrate 160mg . Thanks

## 2010-10-06 MED ORDER — ROSUVASTATIN 10 MG TAB
10 mg | ORAL_TABLET | Freq: Every day | ORAL | Status: DC
Start: 2010-10-06 — End: 2010-12-03

## 2010-10-06 MED ORDER — BUPROPION XL 150 MG 24 HR TAB
150 mg | ORAL_TABLET | ORAL | Status: DC
Start: 2010-10-06 — End: 2011-04-01

## 2010-10-06 MED ORDER — OXYCODONE-ACETAMINOPHEN 5 MG-325 MG TAB
5-325 mg | ORAL_TABLET | Freq: Two times a day (BID) | ORAL | Status: DC | PRN
Start: 2010-10-06 — End: 2010-11-05

## 2010-10-06 MED ORDER — INSULIN DETEMIR 100 UNIT/ML (3 ML) SUB-Q PEN
100 unit/mL (3 mL) | PACK | Freq: Every evening | SUBCUTANEOUS | Status: DC
Start: 2010-10-06 — End: 2011-04-01

## 2010-10-06 MED ORDER — NYSTATIN 100,000 UNIT/G TOPICAL CREAM
100000 unit/gram | Freq: Two times a day (BID) | CUTANEOUS | Status: DC
Start: 2010-10-06 — End: 2011-04-01

## 2010-10-07 NOTE — Progress Notes (Signed)
Subjective   Patient presents for follow up of chronic illnesses including Hypertension, Hypercholesterolemia and DM, Type II.  Patient checks BS regularly and they have been under better control.  Recent labs discussed with patient.   Patient complains of rash under breasts for a few days which is worsening.  Pt denies change in soaps and detergents or new clothes.  Pt also needs refill of medications.  Pt could not tolerate Tricor and wants to restart Crestor.    ROS:   History obtained from the patient   General: negative for - chills, fever or malaise   HEENT: no sores in mouth, vision problems or ear problems  Respiratory: no shortness of breath, cough, or wheezing   Cardiovascular: no chest pain or dyspnea on exertion or edema   Gastrointestinal: no change in bowel habits, or black or bloody stools   Neurological: no paresthesias, weakness or dizzyness    Objective  PE:  The patient appears well, alert, oriented x 3, in no distress. Morbidly obese  BP 128/82   Pulse 88   Ht 5\' 2"  (1.575 m)   Wt 233 lb 9.6 oz (105.96 kg)   BMI 42.73 kg/m2  HEENT:  within normal limits except boggy nasal turbinates.  Neck: supple. No adenopathy, thyromegaly or carotid bruits.   Chest:  Lungs are clear, good air entry, no wheezes, rhonchi or rales.   CV:  Regular rate and rhythm, no murmurs, rubs or gallops.   Abdomen: soft with mild RUQ tenderness, no guarding, mass or organomegaly.   Ext:  No clicks, clubbing or edema  Periph pulses NL. Normal monofilament testing of feet.  Back: + lower lumbar and para lumbar tenderness with limited ROM.   Skin: + Dry, erythematous patches under Breasts with few satellite patches      Impression:  1. DM w/o complication type II (250.00)    2. Candidal intertrigo (112.3C)    3. Essential hypertension, benign (401.1)    4. Lumbago (724.2)    5. Dysthymic disorder (300.4)    6. Mixed hyperlipidemia (272.2)        Orders Placed This Encounter    ??? oxycodone-acetaminophen (PERCOCET) 5-325 mg per tablet     Sig: Take 1 Tab by mouth two (2) times daily as needed for Pain. Do not fill until 09/05/10.     Dispense:  60 Tab     Refill:  0   ??? buPROPion XL (WELLBUTRIN XL) 150 mg tablet     Sig: Take 1 Tab by mouth every morning.     Dispense:  30 Tab     Refill:  2   ??? insulin detemir (LEVEMIR FLEXPEN) 100 unit/mL flexpen     Sig: 80 Units by SubCUTAneous route nightly.     Dispense:  5 Package     Refill:  2   ??? rosuvastatin (CRESTOR) 10 mg tablet     Sig: Take 1 Tab by mouth daily.     Dispense:  30 Tab     Refill:  5   ??? nystatin (MYCOSTATIN) topical cream     Sig: Apply  to affected area two (2) times a day.     Dispense:  30 g     Refill:  1       Pt advised to see opthalmologist yearly.  Pt does not smoke and advised to follow ADA low fat diet and regular exercise program.  Pt is to continue medications as discussed.  Recheck in 1 month/prn.

## 2010-11-05 MED ORDER — TRAZODONE 300 MG TAB
300 mg | ORAL_TABLET | Freq: Every evening | ORAL | Status: DC
Start: 2010-11-05 — End: 2011-04-30

## 2010-11-05 MED ORDER — ALPRAZOLAM 1 MG TAB
1 mg | ORAL_TABLET | Freq: Two times a day (BID) | ORAL | Status: DC
Start: 2010-11-05 — End: 2011-01-02

## 2010-11-05 MED ORDER — PEN NEEDLE, DIABETIC 32 GAUGE X 1/4"
32 gauge x 1/4" | Status: DC
Start: 2010-11-05 — End: 2012-08-26

## 2010-11-05 MED ORDER — EXENATIDE 5 MCG/0.02 ML PER DOSE SUB-Q PEN INJECTOR
5 mcg/dose (20 mcg/mL) 1.2 mL | Freq: Two times a day (BID) | SUBCUTANEOUS | Status: DC
Start: 2010-11-05 — End: 2010-12-04

## 2010-11-05 MED ORDER — OXYCODONE-ACETAMINOPHEN 5 MG-325 MG TAB
5-325 mg | ORAL_TABLET | Freq: Two times a day (BID) | ORAL | Status: DC | PRN
Start: 2010-11-05 — End: 2010-12-04

## 2010-11-07 NOTE — Progress Notes (Signed)
Subjective   Patient presents for follow up of chronic illnesses including Hypertension, Hypercholesterolemia and DM, Type II.  Patient checks BS regularly and they have been under better control but still elevated.  Pt also is concerned about wt gain from insulin.  Pt complains of burn from ETOH on washcloth to left perioral area a few weeks ago which is slow to heal.  Pt has been using Neosporin and Mederma cream.    ROS:   History obtained from the patient   General: negative for - chills, fever or malaise + 4 lb wt gain from last visit.  HEENT: no sores in mouth, vision problems or ear problems  Respiratory: no shortness of breath, cough, or wheezing   Cardiovascular: no chest pain or dyspnea on exertion or edema   Gastrointestinal: no change in bowel habits, or black or bloody stools   Neurological: no paresthesias, weakness or dizzyness    Objective  PE:  The patient appears well, alert, oriented x 3, in no distress. Morbidly obese  BP 118/82   Pulse 96   Resp 20   Ht 5\' 2"  (1.575 m)   Wt 237 lb (107.502 kg)   BMI 43.35 kg/m2  HEENT:  within normal limits except boggy nasal turbinates and scar L perioral area  Neck: supple. No adenopathy, thyromegaly or carotid bruits.   Chest:  Lungs are clear, good air entry, no wheezes, rhonchi or rales.   CV:  Regular rate and rhythm, no murmurs, rubs or gallops.   Abdomen: soft without tenderness, no guarding, mass or organomegaly.   Ext:  No clicks, clubbing or edema  Periph pulses NL. Normal monofilament testing of feet.  Back: + lower lumbar and para lumbar tenderness with limited ROM.       Impression:  1. DM w/o complication type II (250.00)    2. Lumbago (724.2)    3. Bipolar affective, depress, unspec (296.50)    4. Mixed hyperlipidemia (272.2)        Orders Placed This Encounter   ??? oxyCODONE-acetaminophen (PERCOCET) 5-325 mg per tablet     Sig: Take 1 Tab by mouth two (2) times daily as needed for Pain. Do not fill until 09/05/10.     Dispense:  60 Tab      Refill:  0   ??? ALPRAZolam (XANAX) 1 mg tablet     Sig: Take 1 Tab by mouth two (2) times a day.     Dispense:  60 Tab     Refill:  2   ??? traZODone (DESYREL) 300 mg tablet     Sig: Take 1 Tab by mouth nightly.     Dispense:  30 Tab     Refill:  5   ??? exenatide (BYETTA) 5 mcg/0.02 mL injection     Sig: 0.02 mL by SubCUTAneous route two (2) times daily (with meals).     Dispense:  1 Each     Refill:  0   ??? Insulin Needles, Disposable, (NOVOFINE 32) 32 x 1/4 " Ndle     Sig: 1 Each by Does Not Apply route as directed.     Dispense:  100 Each     Refill:  5       Pt advised to see opthalmologist yearly.  Pt does not smoke and advised to follow ADA low fat diet and regular exercise program.  Pt is to continue medications as discussed and add Byetta and decrease Levemir by 10 units.  Monitor BS closely.  Recheck in 1 month/prn.

## 2010-11-17 MED ORDER — OMEPRAZOLE 40 MG CAP, DELAYED RELEASE
40 mg | ORAL_CAPSULE | ORAL | Status: DC
Start: 2010-11-17 — End: 2011-09-02

## 2010-12-03 MED ORDER — SIMVASTATIN 40 MG TAB
40 mg | ORAL_TABLET | Freq: Every evening | ORAL | Status: DC
Start: 2010-12-03 — End: 2011-02-03

## 2010-12-03 NOTE — Telephone Encounter (Signed)
Pt insurance doesn't cover Crestor. Pt has only tried Fenofibrate, insurance requires pt to try simvastatin or a similar med.   Med changed per Dr Pollie Friar, order faxed back to [facility] [phone] and e-scribed. Thanks.

## 2010-12-04 MED ORDER — OXYCODONE-ACETAMINOPHEN 5 MG-325 MG TAB
5-325 mg | ORAL_TABLET | Freq: Two times a day (BID) | ORAL | Status: DC | PRN
Start: 2010-12-04 — End: 2011-01-02

## 2010-12-04 MED ORDER — CYCLOBENZAPRINE 10 MG TAB
10 mg | ORAL_TABLET | Freq: Three times a day (TID) | ORAL | Status: DC | PRN
Start: 2010-12-04 — End: 2011-02-10

## 2010-12-04 MED ORDER — EXENATIDE 10 MCG/DOSE(250 MCG/ML)2.4 ML SUBCUTANEOUS PEN INJECTOR
10 mcg/dose(250 mcg/mL) 2.4 mL | Freq: Two times a day (BID) | SUBCUTANEOUS | Status: DC
Start: 2010-12-04 — End: 2011-09-02

## 2010-12-05 NOTE — Progress Notes (Signed)
Subjective   Patient presents for follow up of chronic illnesses including Hypertension, Hypercholesterolemia and DM, Type II.  Patient checks BS regularly and they have been under fairly good control  Pt also is concerned about wt gain.  Pt needs forms filled out for transportation needed for chronic illnesses.  Pt also was recently seen at [facility] ER on 12/3 for abscess of R axilla.  Pt has recurrent cysts and abscesses primarily in Axilla due to hidradenitis.  Pt was treated with Bactrim and Keflex.  Pt has R arm and back spasms off and on.    ROS:   History obtained from the patient   General: negative for - chills, fever or malaise + 3 lb wt gain from last visit.  HEENT: no sores in mouth, vision problems or ear problems  Respiratory: no shortness of breath, cough, or wheezing   Cardiovascular: no chest pain or dyspnea on exertion or edema   Gastrointestinal: no change in bowel habits, or black or bloody stools   Neurological: no paresthesias, weakness or dizzyness    Objective  PE:  The patient appears well, alert, oriented x 3, in no distress. Morbidly obese  BP 102/74   Pulse 68   Resp 20   Ht 5\' 2"  (1.575 m)   Wt 240 lb 3.2 oz (108.954 kg)   BMI 43.93 kg/m2  HEENT:  within normal limits except boggy nasal turbinates  Neck: supple. No adenopathy, thyromegaly or carotid bruits.   Chest:  Lungs are clear, good air entry, no wheezes, rhonchi or rales.   CV:  Regular rate and rhythm, no murmurs, rubs or gallops.   Abdomen: soft without tenderness, no guarding, mass or organomegaly.   Ext:  No clicks, clubbing or edema  Periph pulses NL. Normal monofilament testing of feet. FROM  Back: + lower lumbar and para lumbar tenderness with limited ROM.   Skin: + NT cysts of axillae R>L      Impression:  1. Lumbago (724.2)    2. Morbid obesity (278.01)    3. Essential hypertension, benign (401.1)    4. DM w/o complication type II (250.00)    5. Mixed hyperlipidemia (272.2)     6. Hidradenitis suppurativa (705.83A)    7. Encounter for long-term (current) use of other medications (V58.69)    8. Contact with or exposure to other viral diseases (V01.79)        Orders Placed This Encounter   ??? METABOLIC PANEL, COMPREHENSIVE   ??? CBC W/O DIFF   ??? LIPID PANEL   ??? HEMOGLOBIN A1C   ??? URINALYSIS W/ RFLX MICROSCOPIC   ??? DRUG PROFILE, UR, 9 DRUGS   ??? RPR   ??? HIV 2 AB   ??? REFERRAL TO DERMATOLOGY     Referral Priority:  Routine     Referral Type:  Consultation     Referral Reason:  Specialty Services Required     Referred to Provider:  Allegra Grana, MD     Requested Specialty:  Dermatology     Number of Visits Requested:  5   ??? oxyCODONE-acetaminophen (PERCOCET) 5-325 mg per tablet     Sig: Take 1 Tab by mouth two (2) times daily as needed for Pain.     Dispense:  60 Tab     Refill:  0   ??? exenatide (BYETTA) 10 mcg/0.04 mL PnIj injection     Sig: 0.04 mL by SubCUTAneous route Before breakfast and dinner.     Dispense:  3  mL     Refill:  5   ??? cyclobenzaprine (FLEXERIL) 10 mg tablet     Sig: Take 1 Tab by mouth three (3) times daily as needed for Muscle Spasm(s).     Dispense:  60 Tab     Refill:  1       Pt does not smoke and advised to follow ADA low fat diet and regular exercise program.  Pt is to continue medications as discussed and increase Byetta dose to 10 mcg.  Monitor BS closely.  Recheck in 1 month/prn.

## 2010-12-09 NOTE — Telephone Encounter (Signed)
Your labs are not in yet.

## 2010-12-09 NOTE — Telephone Encounter (Signed)
Patient is requesting a call regarding recent lab results.

## 2010-12-10 LAB — HIV 2 AB W/REFL IB
HIV-2 AB-O.D. RATIO, 163554: NEGATIVE (ref <1.00)
HIV-2 Ab-O.D. ratio: NEGATIVE (ref <1.00)

## 2010-12-10 LAB — RPR
RPR: NONREACTIVE
RPR: NONREACTIVE

## 2010-12-10 LAB — URINALYSIS W/ RFLX MICROSCOPIC
Bilirubin: NEGATIVE
Blood: NEGATIVE
Glucose: NEGATIVE
Ketone: NEGATIVE
Leukocyte Esterase: NEGATIVE
Nitrites: NEGATIVE
Protein: NEGATIVE
Specific Gravity: 1.022 (ref 1.005–1.030)
Urobilinogen: 0.2 mg/dL (ref 0.0–1.9)
pH (UA): 5.5 (ref 5.0–7.5)

## 2010-12-10 LAB — CBC W/O DIFF
HCT: 40.4 % (ref 34.0–44.0)
HGB: 12.8 g/dL (ref 11.5–15.0)
MCH: 28.5 pg (ref 27.0–34.0)
MCHC: 31.7 g/dL — ABNORMAL LOW (ref 32.0–36.0)
MCV: 90 fL (ref 80–98)
PLATELET: 213 10*3/uL (ref 140–415)
RBC: 4.49 x10E6/uL (ref 3.80–5.10)
RDW: 14.4 % (ref 11.7–15.0)
WBC: 7 10*3/uL (ref 4.0–10.5)

## 2010-12-10 LAB — METABOLIC PANEL, COMPREHENSIVE
A-G Ratio: 1.1 (ref 1.1–2.5)
ALT (SGPT): 46 IU/L — ABNORMAL HIGH (ref 0–40)
AST (SGOT): 42 IU/L — ABNORMAL HIGH (ref 0–40)
Albumin: 4 g/dL (ref 3.5–5.5)
Alk. phosphatase: 77 IU/L (ref 25–150)
BUN/Creatinine ratio: 19 (ref 9–23)
BUN: 17 mg/dL (ref 6–24)
Bilirubin, total: 0.2 mg/dL (ref 0.0–1.2)
CO2: 23 mmol/L (ref 20–32)
Calcium: 9.2 mg/dL (ref 8.7–10.2)
Chloride: 100 mmol/L (ref 97–108)
Creatinine: 0.91 mg/dL (ref 0.57–1.00)
GFR est AA: 87 mL/min/{1.73_m2} (ref >59)
GFR est non-AA: 75 mL/min/{1.73_m2} (ref >59)
GLOBULIN, TOTAL: 3.6 g/dL (ref 1.5–4.5)
Glucose: 146 mg/dL — ABNORMAL HIGH (ref 65–99)
Potassium: 4.6 mmol/L (ref 3.5–5.2)
Protein, total: 7.6 g/dL (ref 6.0–8.5)
Sodium: 134 mmol/L — ABNORMAL LOW (ref 135–145)

## 2010-12-10 LAB — DRUG PROFILE 799031: Benzodiazepines: NEGATIVE

## 2010-12-10 LAB — DRUG PROFILE, UR, 9 DRUGS
Amphetamines: NEGATIVE ng/mL
Barbiturates: NEGATIVE ng/mL
Cannabinoids: NEGATIVE ng/mL
Cocaine: NEGATIVE ng/mL
Methadone Screen, Urine: NEGATIVE ng/mL
Phencyclidine: NEGATIVE ng/mL
Propoxyphene: NEGATIVE ng/mL

## 2010-12-10 LAB — HEMOGLOBIN A1C WITH EAG: Hemoglobin A1c: 6.9 % — ABNORMAL HIGH (ref 4.8–5.6)

## 2010-12-10 LAB — LIPID PANEL
Cholesterol, total: 262 mg/dL — ABNORMAL HIGH (ref 100–199)
HDL Cholesterol: 38 mg/dL — ABNORMAL LOW (ref >39)
LDL, calculated: 199 mg/dL — ABNORMAL HIGH (ref 0–99)
Triglyceride: 125 mg/dL (ref 0–149)
VLDL, calculated: 25 mg/dL (ref 5–40)

## 2010-12-10 LAB — OPIATES CONF (GC/MS): Opiates: NEGATIVE

## 2010-12-10 NOTE — Progress Notes (Addendum)
Quick Note:    Labs stable/normal except elevated cholesterol and elevated BS. Patient was started on Zocor recently and she is to follow 1800 calorie low cholesterol diet and recheck in 3 months.  ______

## 2010-12-11 NOTE — Progress Notes (Addendum)
Quick Note:    Letter sent    ______

## 2010-12-11 NOTE — Telephone Encounter (Signed)
Patient is requesting a call today to get recent lab results.  Phone: [phone]

## 2010-12-12 NOTE — Telephone Encounter (Signed)
Patient informed of the recommendations on the letter that has been sent out.  Patient did inquire about other testing that had been done: Informed her to refer to the letter that has been sent.    Thanks.

## 2011-01-02 MED ORDER — ALPRAZOLAM 1 MG TAB
1 mg | ORAL_TABLET | Freq: Two times a day (BID) | ORAL | Status: DC
Start: 2011-01-02 — End: 2011-04-30

## 2011-01-02 MED ORDER — OXYCODONE-ACETAMINOPHEN 5 MG-325 MG TAB
5-325 mg | ORAL_TABLET | Freq: Two times a day (BID) | ORAL | Status: DC | PRN
Start: 2011-01-02 — End: 2011-02-03

## 2011-01-02 NOTE — Progress Notes (Signed)
Subjective    Pt presents for follow up of chronic illnesses.  Pt needs refills.  Pt is doing well on medications and denies complaints or side effects except L arm pain.  No known injury.    ROS:   History obtained from the patient   General: negative for - chills, fever   HEENT: no sores in mouth, vision problems or ear problems   Respiratory: no shortness of breath, cough, or wheezing   Cardiovascular: no chest pain, dyspnea on exertion, or edema  Gastrointestinal: no abdominal pain, change in bowel habits, or black or bloody stools            Objective  PE  The patient appears well, alert, oriented x 3, in no distress.   BP 116/75   Pulse 106   Temp(Src) 97.7 ??F (36.5 ??C) (Oral)   Resp 16   Ht 5\' 2"  (1.575 m)   Wt 240 lb 4 oz (108.977 kg)   BMI 43.94 kg/m2  HEENT - Normal  Neck supple -  No adenopathy, thyromegaly or bruits.   Chest: Lungs are clear  CV: RRR with no murmurs  Periph pulses NL.   Abdomen: soft without tenderness, guarding, mass or organomegaly.   Ext: No clicks, clubbing or edema. + Left upper arm muscle tenderness.    Impression/Plan:  1. Essential hypertension, benign (401.1)     2. Bipolar affective, depress, unspec (296.50)  ALPRAZolam (XANAX) 1 mg tablet   3. Lumbago (724.2)  oxyCODONE-acetaminophen (PERCOCET) 5-325 mg per tablet   4. DM w/o complication type II (250.00)                Chronic illnesses are stable on current regimen.  Diet and exercise discussed.        Continue current medications.  Alleve prn arm pain if tolerated.        Patient expressed understanding with the diagnosis and plan.  Pt advised to quit smoking.   Follow up in 1-2 months.

## 2011-02-03 MED ORDER — OXYCODONE-ACETAMINOPHEN 5 MG-325 MG TAB
5-325 mg | ORAL_TABLET | Freq: Two times a day (BID) | ORAL | Status: DC | PRN
Start: 2011-02-03 — End: 2011-03-03

## 2011-02-03 MED ORDER — SIMVASTATIN 40 MG TAB
40 mg | ORAL_TABLET | Freq: Every evening | ORAL | Status: DC
Start: 2011-02-03 — End: 2011-08-03

## 2011-02-03 NOTE — Progress Notes (Signed)
Subjective    Pt presents for follow up of chronic illnesses.  Pt needs refills.  BS's have been controlled.  Pt is doing well on medications and denies complaints or side effects except occasional leg and abdominal cramps.  No known cause.    ROS:   History obtained from the patient   General: negative for - chills, fever   HEENT: no sores in mouth, vision problems or ear problems   Respiratory: no shortness of breath, cough, or wheezing   Cardiovascular: no chest pain, dyspnea on exertion, or edema  Gastrointestinal: no abdominal pain, change in bowel habits, or black or bloody stools            Objective  PE  The patient appears well, alert, oriented x 3, in no distress. Morbidly obese  BP 102/80   Pulse 92   Resp 20   Ht 5\' 2"  (1.575 m)   Wt 238 lb (107.956 kg)   BMI 43.53 kg/m2  HEENT - Normal  Neck supple -  No adenopathy, thyromegaly or bruits.   Chest: Lungs are clear  CV: RRR with no murmurs  Periph pulses NL.   Abdomen: soft without tenderness, guarding, mass or organomegaly.   Ext: No clicks, clubbing or edema.     Impression/Plan:  1. Lumbago  oxyCODONE-acetaminophen (PERCOCET) 5-325 mg per tablet   2. Mixed hyperlipidemia  simvastatin (ZOCOR) 40 mg tablet   3. DM w/o complication type II     4. Essential hypertension, benign                Chronic illnesses are stable on current regimen.  Diet and exercise discussed.        Continue current medications.          Patient expressed understanding with the diagnosis and plan.  Pt advised to quit smoking.   Follow up in 1 month with fasting labs.

## 2011-02-09 NOTE — Telephone Encounter (Signed)
Patient is requesting the following prescription:  Cyclobenzaprine (FLEXERIL) 10 mg tablet  Please send to [facility]: [phone]

## 2011-02-10 MED ORDER — CYCLOBENZAPRINE 10 MG TAB
10 mg | ORAL_TABLET | Freq: Three times a day (TID) | ORAL | Status: DC | PRN
Start: 2011-02-10 — End: 2011-04-30

## 2011-02-10 NOTE — Telephone Encounter (Signed)
Last seen on 02/03/11. Thanks

## 2011-02-13 NOTE — Telephone Encounter (Signed)
The patient would like to receive mammogram results, patient can be reached at: [phone]. Thanks.

## 2011-02-16 NOTE — Telephone Encounter (Signed)
Mammogram was normal.  Please notify patient.

## 2011-02-17 NOTE — Telephone Encounter (Signed)
Called pt advised mammogram was normal.

## 2011-02-19 NOTE — Telephone Encounter (Signed)
Called pt advised use OTC Mucinex D twice a day and Claritin prn and if symptoms do not improve make appt to be seen.

## 2011-02-19 NOTE — Telephone Encounter (Signed)
Patient may use Mucinex D twice a day and Claritin prn and if symptoms are not resolving in 3-4 days she will need to been seen or call back.

## 2011-02-19 NOTE — Telephone Encounter (Signed)
Patient called and stated that she has a head cold and wanted to know if Dr. Pollie Friar will call something in for her or suggest what she can use.  I told her that more than likely she will need to be seen.  She stated that she has issues because with her transportation she has to give 5 days notice.  Please call at [phone]

## 2011-03-03 MED ORDER — CEFDINIR 300 MG CAP
300 mg | ORAL_CAPSULE | Freq: Two times a day (BID) | ORAL | Status: AC
Start: 2011-03-03 — End: 2011-03-13

## 2011-03-03 MED ORDER — DICLOFENAC 1 % TOPICAL GEL
1 % | Freq: Four times a day (QID) | CUTANEOUS | Status: DC
Start: 2011-03-03 — End: 2011-03-04

## 2011-03-03 MED ORDER — OXYCODONE-ACETAMINOPHEN 5 MG-325 MG TAB
5-325 mg | ORAL_TABLET | Freq: Two times a day (BID) | ORAL | Status: DC | PRN
Start: 2011-03-03 — End: 2011-04-01

## 2011-03-03 NOTE — Progress Notes (Signed)
Pt here for follow-up  Med refill

## 2011-03-03 NOTE — Progress Notes (Signed)
Subjective    Pt presents for follow up of chronic illnesses.  Pt is doing well on medications and BS's have been controlled.  Pt also complains of persistent sinus congestion for 2 weeks.  Pt also complains of trigger finger of R 2nd finger.  Pt has lost 3 lbs since last visit.    ROS:   History obtained from the patient   General: negative for - chills, fever   HEENT: no sores in mouth, vision problems or ear problems   Respiratory: no shortness of breath, or wheezing + cough  Cardiovascular: no chest pain, dyspnea on exertion, or edema  Gastrointestinal: no abdominal pain, change in bowel habits, or black or bloody stools            Objective  PE  The patient appears well, alert, oriented x 3, in no distress. Obese  BP 129/82   Pulse 92   Temp(Src) 98 ??F (36.7 ??C) (Oral)   Resp 18   Ht 5\' 2"  (1.575 m)   Wt 235 lb 12.8 oz (106.958 kg)   BMI 43.13 kg/m2  HEENT - Normal except + swollen turbinates with purulent nasal discharge and mild maxillary sinus tenderness.  Neck supple -  No adenopathy, thyromegaly or bruits.   Chest: Lungs are clear  CV: RRR with no murmurs  Periph pulses NL.   Abdomen: soft without tenderness, guarding, mass or organomegaly.   Ext: No clicks, clubbing or edema. FROM, NT    Impression/Plan:  1. Lumbago  oxyCODONE-acetaminophen (PERCOCET) 5-325 mg per tablet   2. Trigger finger, acquired     3. Essential hypertension, benign  METABOLIC PANEL, COMPREHENSIVE, CBC W/O DIFF   4. Urge incontinence     5. DM w/o complication type II  METABOLIC PANEL, COMPREHENSIVE, HEMOGLOBIN A1C, MICROALBUMIN, UR, RAND   6. Mixed hyperlipidemia  METABOLIC PANEL, COMPREHENSIVE, LIPID PANEL   7. Acute maxillary sinusitis  cefdinir (OMNICEF) 300 mg capsule   8. Pain in joint, lower leg  Diclofenac Sodium (VOLTAREN) 1 % topical gel            Chronic illnesses are stable on current regimen.  Diet and exercise discussed.        Continue current medications.         Patient expressed understanding with the diagnosis and plan.  Pt advised to quit smoking.   Follow up in 1 month with fasting labs.

## 2011-03-04 MED ORDER — DICLOFENAC 1.5 % TOPICAL DROPS
1.5 % | Freq: Three times a day (TID) | CUTANEOUS | Status: DC | PRN
Start: 2011-03-04 — End: 2011-04-01

## 2011-03-04 NOTE — Progress Notes (Signed)
Per Dr Pollie Friar order placed in system in place of the voltaren gel.

## 2011-03-08 NOTE — Telephone Encounter (Signed)
Called on 03/06/2011 at 9: 35pm.  Pt called complaining of elevated blood sugars of 365, after taking insulin still 313.  Pt told to proceed to the ER for evaluation.  She agrees with plan.

## 2011-03-13 NOTE — Telephone Encounter (Signed)
Returned pt's call, pt said BS this morning was 329 has been taking insulin as prescribed plus more 80 units in the morning and evening.  Pt has no way to the office today thinks her med's really need changed to help keep the BS down, she is eating healthy she said.

## 2011-03-13 NOTE — Telephone Encounter (Signed)
Having is having issues with blood sugar, please call at [phone] to advise. She is very concerned please call asap. It is running high and she has even tried insulin twice a day and it is still running high.

## 2011-03-13 NOTE — Telephone Encounter (Signed)
She has trouble getting here. I told her the below. She said ok.

## 2011-03-13 NOTE — Telephone Encounter (Signed)
Patient will need to be seen to discuss.  Please have her make appointment or go to ER for further evaluation.

## 2011-03-16 MED ORDER — CLONIDINE 0.2 MG TAB
0.2 mg | ORAL_TABLET | ORAL | Status: DC
Start: 2011-03-16 — End: 2011-12-02

## 2011-04-01 MED ORDER — INSULIN DETEMIR 100 UNIT/ML (3 ML) SUB-Q PEN
100 unit/mL (3 mL) | SUBCUTANEOUS | Status: DC
Start: 2011-04-01 — End: 2011-04-30

## 2011-04-01 MED ORDER — INSULIN ASPART 100 UNIT/ML (3 ML) SUB-Q PEN
100 unit/mL (3 mL) | SUBCUTANEOUS | Status: DC
Start: 2011-04-01 — End: 2011-04-02

## 2011-04-01 MED ORDER — FLUCONAZOLE 150 MG TAB
150 mg | ORAL_TABLET | Freq: Every day | ORAL | Status: AC
Start: 2011-04-01 — End: 2011-04-04

## 2011-04-01 MED ORDER — OXYCODONE-ACETAMINOPHEN 5 MG-325 MG TAB
5-325 mg | ORAL_TABLET | Freq: Two times a day (BID) | ORAL | Status: DC | PRN
Start: 2011-04-01 — End: 2011-04-30

## 2011-04-01 MED ORDER — VARENICLINE 0.5 MG (11)-1 MG (3X14) TABS IN A DOSE PACK
0.5 mg (11)- 1 mg (42) | PACK | ORAL | Status: DC
Start: 2011-04-01 — End: 2011-07-02

## 2011-04-02 LAB — HIV 2 AB W/REFL IB
HIV-2 AB-O.D. RATIO, 163554: NEGATIVE (ref <1.00)
HIV-2 Ab-O.D. ratio: NEGATIVE (ref <1.00)

## 2011-04-02 LAB — RPR
RPR: NONREACTIVE
RPR: NONREACTIVE

## 2011-04-02 LAB — METABOLIC PANEL, COMPREHENSIVE
A-G Ratio: 1.3 (ref 1.1–2.5)
ALT (SGPT): 26 IU/L (ref 0–40)
AST (SGOT): 23 IU/L (ref 0–40)
Albumin: 4 g/dL (ref 3.5–5.5)
Alk. phosphatase: 89 IU/L (ref 25–150)
BUN/Creatinine ratio: 17 (ref 9–23)
BUN: 13 mg/dL (ref 6–24)
Bilirubin, total: 0.3 mg/dL (ref 0.0–1.2)
CO2: 24 mmol/L (ref 20–32)
Calcium: 9.1 mg/dL (ref 8.7–10.2)
Chloride: 92 mmol/L — ABNORMAL LOW (ref 97–108)
Creatinine: 0.77 mg/dL (ref 0.57–1.00)
GFR est AA: 106 mL/min/{1.73_m2} (ref >59)
GFR est non-AA: 92 mL/min/{1.73_m2} (ref >59)
GLOBULIN, TOTAL: 3.2 g/dL (ref 1.5–4.5)
Glucose: 312 mg/dL — ABNORMAL HIGH (ref 65–99)
Potassium: 4.5 mmol/L (ref 3.5–5.2)
Protein, total: 7.2 g/dL (ref 6.0–8.5)
Sodium: 133 mmol/L — ABNORMAL LOW (ref 134–144)

## 2011-04-02 LAB — DRUG PROFILE, UR, 9 DRUGS
Amphetamines: NEGATIVE ng/mL
Barbiturates: NEGATIVE ng/mL
Benzodiazepines: NEGATIVE ng/mL
Cannabinoids: NEGATIVE ng/mL
Cocaine: NEGATIVE ng/mL
Methadone Screen, Urine: NEGATIVE ng/mL
Opiates: NEGATIVE ng/mL
PROPOXYPHENE, URINE: NEGATIVE ng/mL
Phencyclidine: NEGATIVE ng/mL

## 2011-04-02 LAB — URINALYSIS W/ RFLX MICROSCOPIC
Bilirubin: NEGATIVE
Blood: NEGATIVE
Ketone: NEGATIVE
Leukocyte Esterase: NEGATIVE
Nitrites: NEGATIVE
Specific Gravity: 1.024 (ref 1.005–1.030)
Urobilinogen: 0.2 mg/dL (ref 0.0–1.9)
pH (UA): 6 (ref 5.0–7.5)

## 2011-04-02 LAB — CBC W/O DIFF
HCT: 41 % (ref 34.0–44.0)
HGB: 12.8 g/dL (ref 11.5–15.0)
MCH: 29.2 pg (ref 27.0–34.0)
MCHC: 31.2 g/dL — ABNORMAL LOW (ref 32.0–36.0)
MCV: 93 fL (ref 80–98)
PLATELET: 196 10*3/uL (ref 140–415)
RBC: 4.39 x10E6/uL (ref 3.80–5.10)
RDW: 14.3 % (ref 11.7–15.0)
WBC: 7.8 10*3/uL (ref 4.0–10.5)

## 2011-04-02 LAB — LIPID PANEL
Cholesterol, total: 215 mg/dL — ABNORMAL HIGH (ref 100–199)
HDL Cholesterol: 26 mg/dL — ABNORMAL LOW (ref >39)
Triglyceride: 414 mg/dL — ABNORMAL HIGH (ref 0–149)

## 2011-04-02 LAB — MICROALBUMIN, UR, RAND W/ MICROALB/CREAT RATIO
Creatinine, urine random: 93.6 mg/dL (ref 15.0–278.0)
Microalb/Creat ratio (ug/mg creat.): 43.5 mg/g creat — ABNORMAL HIGH (ref 0.0–30.0)
Microalbumin, urine: 40.7 ug/mL — ABNORMAL HIGH (ref 0.0–17.0)

## 2011-04-02 LAB — HEMOGLOBIN A1C WITH EAG: Hemoglobin A1c: 11.3 % — ABNORMAL HIGH (ref 4.8–5.6)

## 2011-04-02 MED ORDER — INSULIN LISPRO 100 UNIT/ML (3 ML) SUB-Q PEN
100 unit/mL | SUBCUTANEOUS | Status: DC
Start: 2011-04-02 — End: 2011-06-01

## 2011-04-02 NOTE — Progress Notes (Signed)
Subjective    Pt presents for follow up of chronic illnesses.    Pt is doing well on medications and BS's have been uncontrolled spiking to as high as 370.  Pt has increased Lantus to 100 units per day.  Pt also complains of vaginal burning and itching for several days..  Pt had unprotected intercourse about 3 weeks ago.   Patient has history of OA of lumbar spine with chronic pain 8/10 without medication worse with certain activities.   ROS:   History obtained from the patient   General: negative for - chills, fever   HEENT: no sores in mouth, vision problems or ear problems   Respiratory: no shortness of breath, or wheezing + occasional cough  Cardiovascular: no chest pain, dyspnea on exertion, or edema  Gastrointestinal: no abdominal pain, change in bowel habits, or black or bloody stools            Objective  PE  The patient appears well, alert, oriented x 3, in no distress. Obese  BP 129/81   Pulse 81   Temp(Src) 97.6 ??F (36.4 ??C) (Oral)   Resp 18   Ht 5\' 2"  (1.575 m)   Wt 240 lb 8 oz (109.09 kg)   BMI 43.99 kg/m2  HEENT - Normal except + swollen turbinates with mild congestion.  Neck supple -  No adenopathy, thyromegaly or bruits.   Chest: Lungs are clear  CV: RRR with no murmurs  Periph pulses NL.   Abdomen: soft without tenderness, guarding, mass or organomegaly.   Back: + lower lumbar and para lumbar tenderness with limited ROM.   Ext: No clicks, clubbing or edema. FROM, NT  Neuro - +2/4 DTR's LE's, 5/5 strength and normal sensation to LT/PP LE's and negative SLR to 90 degrees.   GU: deferred    Impression/Plan:  1. Type II or unspecified type diabetes mellitus without mention of complication, uncontrolled  insulin aspart (NOVOLOG FLEXPEN) 100 unit/mL flexpen, METABOLIC PANEL, COMPREHENSIVE, CBC W/O DIFF, MICROALBUMIN, UR, RAND, HEMOGLOBIN A1C   2. Candidiasis of vulva and vagina  fluconazole (DIFLUCAN) 150 mg tablet   3. Essential hypertension, benign  URINALYSIS W/ RFLX MICROSCOPIC    4. Mixed hyperlipidemia  LIPID PANEL   5. Lumbago  oxyCODONE-acetaminophen (PERCOCET) 5-325 mg per tablet   6. Contact with or exposure to other viral diseases  RPR, HIV 2 AB   7. Encounter for long-term (current) use of other medications  DRUG PROFILE, UR, 9 DRUGS   8. Tobacco use disorder              Diet and exercise discussed.        Continue current medications.  Pain contract in place, PMP done.        Patient expressed understanding with the diagnosis and plan.  Pt advised to quit smoking.   Follow up in 1 month.

## 2011-04-02 NOTE — Telephone Encounter (Signed)
[facility] sent denial on Novolog Flexpen that starts pt must must humalog/humalin instead.  Changed per dr dodd.  Faxed information back to pharmacy on 03/31/10. Thanks.

## 2011-04-03 NOTE — Telephone Encounter (Signed)
Patient calling about test results she is aware that dr. Pollie Friar is out but is asking for someone to call anyway.  Please call at [phone]

## 2011-04-06 MED ORDER — FENOFIBRATE 160 MG TAB
160 mg | ORAL_TABLET | Freq: Every day | ORAL | Status: DC
Start: 2011-04-06 — End: 2011-08-03

## 2011-04-06 NOTE — Telephone Encounter (Signed)
I see results but note.  Please advised as to what to tell pt. Thanks.

## 2011-04-06 NOTE — Telephone Encounter (Signed)
Patient called and would like a call about her lab results.  Please call at [phone]

## 2011-04-06 NOTE — Progress Notes (Signed)
Quick Note:    Labs stable/normal except very elevated blood sugar and HbA1C and Triglycerides. Please follow 1800 calorie diet and add Fenofibrate and increase Levemir to 60 units twice a day and follow up in 1 month. New medication sent to pharmacy. Please notify patient.  ______

## 2011-04-06 NOTE — Telephone Encounter (Signed)
Patient called and results discussed.

## 2011-04-07 NOTE — Progress Notes (Signed)
Quick Note:    Mailed letter to pt per request. She states that Dr Pollie Friar spoke with her yesterday evening. Thanks.  ______

## 2011-04-30 MED ORDER — CYCLOBENZAPRINE 10 MG TAB
10 mg | ORAL_TABLET | Freq: Three times a day (TID) | ORAL | Status: DC | PRN
Start: 2011-04-30 — End: 2011-08-21

## 2011-04-30 MED ORDER — INSULIN DETEMIR 100 UNIT/ML (3 ML) SUB-Q PEN
100 unit/mL (3 mL) | Freq: Two times a day (BID) | SUBCUTANEOUS | Status: DC
Start: 2011-04-30 — End: 2011-06-01

## 2011-04-30 MED ORDER — ALPRAZOLAM 1 MG TAB
1 mg | ORAL_TABLET | Freq: Two times a day (BID) | ORAL | Status: DC
Start: 2011-04-30 — End: 2011-08-03

## 2011-04-30 MED ORDER — LOSARTAN 50 MG TAB
50 mg | ORAL_TABLET | Freq: Every day | ORAL | Status: DC
Start: 2011-04-30 — End: 2011-06-01

## 2011-04-30 MED ORDER — OXYCODONE-ACETAMINOPHEN 5 MG-325 MG TAB
5-325 mg | ORAL_TABLET | Freq: Two times a day (BID) | ORAL | Status: DC | PRN
Start: 2011-04-30 — End: 2011-06-01

## 2011-04-30 MED ORDER — TRAZODONE 300 MG TAB
300 mg | ORAL_TABLET | Freq: Every evening | ORAL | Status: DC
Start: 2011-04-30 — End: 2011-09-02

## 2011-04-30 NOTE — Progress Notes (Signed)
Subjective    Pt presents for follow up of chronic illnesses.    Pt is doing well on medications and BS's have been better controlled with increased Levemir ranging from 112-140.  Recent labs discussed.   Patient has history of OA of lumbar spine with chronic pain 8/10 without medication worse with certain activities.   ROS:   History obtained from the patient   General: negative for - chills, fever   HEENT: no sores in mouth, vision problems or ear problems   Respiratory: no shortness of breath, or wheezing + occasional dry cough esp hs  Cardiovascular: no chest pain, dyspnea on exertion, or edema  Gastrointestinal: no abdominal pain, change in bowel habits, or black or bloody stools            Objective  PE  The patient appears well, alert, oriented x 3, in no distress. Obese  BP 136/88   Pulse 88   Resp 22   Ht 5\' 2"  (1.575 m)   Wt 245 lb (111.131 kg)   BMI 44.81 kg/m2  HEENT - Normal except + boggy turbinates with mild congestion.  Neck supple -  No adenopathy, thyromegaly or bruits.   Chest: Lungs are clear  CV: RRR with no murmurs  Periph pulses NL.   Abdomen: soft without tenderness, guarding, mass or organomegaly.   Back: + lower lumbar and para lumbar tenderness with limited ROM.   Ext: No clicks, clubbing or edema. FROM, NT  Neuro - +2/4 DTR's LE's, 5/5 strength and normal sensation to LT/PP LE's and negative SLR to 90 degrees.       Impression/Plan:  1. Bipolar I disorder, most recent episode (or current) depressed, unspecified  ALPRAZolam (XANAX) 1 mg tablet, traZODone (DESYREL) 300 mg tablet   2. Lumbago  cyclobenzaprine (FLEXERIL) 10 mg tablet, oxyCODONE-acetaminophen (PERCOCET) 5-325 mg per tablet   3. Essential hypertension, benign     4. Mixed hyperlipidemia     5. Microalbuminuria  losartan (COZAAR) 50 mg tablet   6. Type II or unspecified type diabetes mellitus without mention of complication, uncontrolled  insulin detemir (LEVEMIR FLEXPEN) 100 unit/mL (3 mL) pen    7. Allergic rhinitis due to other allergen              Diet and exercise discussed.        Continue current medications. Use Benadryl or Claritin daily for allergies.        Pain contract in place, PMP done.        Patient expressed understanding with the diagnosis and plan.  Pt advised to quit smoking.   Follow up in 1 month/prn.

## 2011-06-01 MED ORDER — OXYCODONE-ACETAMINOPHEN 5 MG-325 MG TAB
5-325 mg | ORAL_TABLET | Freq: Two times a day (BID) | ORAL | Status: DC | PRN
Start: 2011-06-01 — End: 2011-07-02

## 2011-06-01 MED ORDER — INSULIN DETEMIR 100 UNIT/ML (3 ML) SUB-Q PEN
100 unit/mL (3 mL) | Freq: Two times a day (BID) | SUBCUTANEOUS | Status: DC
Start: 2011-06-01 — End: 2011-08-03

## 2011-06-01 MED ORDER — VALSARTAN 160 MG TAB
160 mg | ORAL_TABLET | Freq: Every day | ORAL | Status: DC
Start: 2011-06-01 — End: 2011-09-02

## 2011-06-01 MED ORDER — INSULIN LISPRO 100 UNIT/ML (3 ML) SUB-Q PEN
100 unit/mL | SUBCUTANEOUS | Status: DC
Start: 2011-06-01 — End: 2012-08-26

## 2011-06-01 NOTE — Progress Notes (Signed)
Chief Complaint   Patient presents with   ??? Medication Evaluation

## 2011-06-01 NOTE — Progress Notes (Signed)
Subjective:  Patient presents with spondylosis, primarily affecting the back. Pain is described as aching, severe, and is intermittent.    Related to injury: no.  Associated symptoms include: decreased ROM.    Rheumatological ROS: ongoing significant pain in back which is stable and controlled by PRN meds.   Pain score without treatment: 8/10  Pain score with treatment: 1/10  Response to treatment plan: stable.   Patient has negative FH and personal use of illicit drugs currently   Tobacco Use: 1/4 ppd  BS are running in mid 100's  Pt also complains of lower abdominal pain off and on.  No aggravating factors.  Cozaar is causing headaches.    Current Outpatient Prescriptions   Medication Sig Dispense Refill   ??? oxyCODONE-acetaminophen (PERCOCET) 5-325 mg per tablet Take 1 Tab by mouth two (2) times daily as needed for Pain. Do not fill until 05/01/11.  60 Tab  0   ??? insulin lispro (HUMALOG KWIKPEN) 100 unit/mL kwikpen 10 units subcutaneous before meals three times a day.  5 Each  5   ??? insulin detemir (LEVEMIR FLEXPEN) 100 unit/mL (3 mL) pen 60 Units by SubCUTAneous route two (2) times a day.  10 Each  5   ??? valsartan (DIOVAN) 160 mg tablet Take 1 Tab by mouth daily.  30 Tab  5   ??? ALPRAZolam (XANAX) 1 mg tablet Take 1 Tab by mouth two (2) times a day.  60 Tab  2   ??? traZODone (DESYREL) 300 mg tablet Take 1 Tab by mouth nightly.  30 Tab  5   ??? cyclobenzaprine (FLEXERIL) 10 mg tablet Take 1 Tab by mouth three (3) times daily as needed for Muscle Spasm(s).  60 Tab  3   ??? fenofibrate (TRICOR) 160 mg tablet Take 1 Tab by mouth daily. Take with food.  30 Tab  5   ??? varenicline (CHANTIX STARTING MONTH BOX) Per Dose pack instructions  1 Package  0   ??? cloNIDine (CATAPRESS) 0.2 mg tablet take 1 tablet by mouth twice a day  60 Tab  8   ??? simvastatin (ZOCOR) 40 mg tablet Take 1 Tab by mouth nightly.  30 Tab  2    ??? exenatide (BYETTA) 10 mcg/0.04 mL PnIj injection 0.04 mL by SubCUTAneous route Before breakfast and dinner.  3 mL  5   ??? omeprazole (PRILOSEC) 40 mg capsule take 1 capsule by mouth once daily  30 Cap  11   ??? Insulin Needles, Disposable, (NOVOFINE 32) 32 x 1/4 " Ndle 1 Each by Does Not Apply route as directed.  100 Each  5   ??? Blood-Glucose Meter monitoring kit by Does Not Apply route. Use as directed    1 Kit  0   ??? Lancets & Blood Glucose Strips Cmpk 1 Units by Does Not Apply route as directed. Monitor BS BID Dx: 250.02  100 Units  5   ??? promethazine (PHENERGAN) 25 mg tablet take 25 mg by mouth every six (6) hours as needed for Nausea.         Allergies   Allergen Reactions   ??? Niaspan (Niacin) Rash   ??? Aspirin Nausea Only   ??? Ibuprofen Swelling     Past Medical History   Diagnosis Date   ??? Asthma    ??? GERD (gastroesophageal reflux disease)    ??? Hypertension    ??? Psychotic disorder    ??? Cyst      excision of a chronic abscess of  left axilla     Past Surgical History   Procedure Date   ??? Abdomen surgery proc unlisted    ??? Hx heent      Family History   Problem Relation Age of Onset   ??? Diabetes Mother    ??? Hypertension Mother    ??? Stroke Mother    ??? Cancer Mother      kidney   ??? Cancer Father    ??? Stroke Father    ??? Depression Sister    ??? Depression Brother      History   Substance Use Topics   ??? Smoking status: Current Everyday Smoker -- 0.5 packs/day     Types: Cigarettes   ??? Smokeless tobacco: Never Used   ??? Alcohol Use: No        Review of Systems  A comprehensive review of systems was negative except for that written in the HPI.    Objective:    The patient appears well, alert, oriented x 3, in no distress.   BP 121/77   Pulse 104   Temp(Src) 96.9 ??F (36.1 ??C) (Oral)   Resp 18   Ht 5\' 2"  (1.575 m)   Wt 247 lb (112.038 kg)   BMI 45.18 kg/m2   Chest: Lungs are clear, good air entry, no wheezes, rhonchi or rales.   CV: S1 and S2 normal, no murmurs, rubs or gallops, regular rate and rhythm.    Abdomen: soft without tenderness, guarding, mass or organomegaly.   Musculoskeletal:  Back: + lower lumbar and para lumbar tenderness with limited ROM.   Extremities: show no edema, normal peripheral pulses.   Neurological is normal without focal findings.   Functional Assessment: Patient is ambulatory and accomplishing tasks of IADL's and ADL's which she had difficulty with prior to treatment.    Impression/Plan  1. Essential hypertension, benign  valsartan (DIOVAN) 160 mg tablet   2. Type II or unspecified type diabetes mellitus without mention of complication, uncontrolled  insulin lispro (HUMALOG KWIKPEN) 100 unit/mL kwikpen, insulin detemir (LEVEMIR FLEXPEN) 100 unit/mL (3 mL) pen   3. Lumbar spondylosis     4. Mixed hyperlipidemia     5. Lumbago  oxyCODONE-acetaminophen (PERCOCET) 5-325 mg per tablet       Pain contract in place.  PMP reviewed  Last Random Drug screening: 04/01/11  Side effects and use of medications discussed and patient understands and agrees with plan.  Pt is achieving goals of treatment such as ambulating and able to care for self with minimal pain at this time on medication.  Specialty care: None at this time needed.  Diet and exercise discussed.        Continue current medications.        Patient expressed understanding with the diagnosis and plan.  Pt advised to quit smoking.   Follow up in 1 month/prn.

## 2011-07-02 MED ORDER — VARENICLINE 1 MG TAB
1 mg | ORAL_TABLET | Freq: Two times a day (BID) | ORAL | Status: DC
Start: 2011-07-02 — End: 2011-07-22

## 2011-07-02 MED ORDER — OXYCODONE-ACETAMINOPHEN 5 MG-325 MG TAB
5-325 mg | ORAL_TABLET | Freq: Two times a day (BID) | ORAL | Status: DC | PRN
Start: 2011-07-02 — End: 2011-08-03

## 2011-07-04 LAB — DRUG PROFILE, UR, 9 DRUGS
Amphetamines: NEGATIVE ng/mL
Barbiturates: NEGATIVE ng/mL
Benzodiazepines: NEGATIVE ng/mL
Cannabinoids: NEGATIVE ng/mL
Cocaine: NEGATIVE ng/mL
Methadone Screen, Urine: NEGATIVE ng/mL
Opiates: NEGATIVE ng/mL
PROPOXYPHENE, URINE: NEGATIVE ng/mL
Phencyclidine: NEGATIVE ng/mL

## 2011-07-04 LAB — CBC WITH AUTOMATED DIFF
ABS. BASOPHILS: 0 10*3/uL (ref 0.0–0.2)
ABS. EOSINOPHILS: 0.1 10*3/uL (ref 0.0–0.4)
ABS. IMM. GRANS.: 0 10*3/uL (ref 0.0–0.1)
ABS. MONOCYTES: 0.4 10*3/uL (ref 0.1–1.0)
ABS. NEUTROPHILS: 2.6 10*3/uL (ref 1.8–7.8)
Abs Lymphocytes: 4.8 10*3/uL — ABNORMAL HIGH (ref 0.7–4.5)
BASOPHILS: 0 % (ref 0–3)
EOSINOPHILS: 1 % (ref 0–7)
HCT: 39.5 % (ref 34.0–46.6)
HGB: 12.4 g/dL (ref 11.1–15.9)
IMMATURE GRANULOCYTES: 0 % (ref 0–2)
Lymphocytes: 62 % — ABNORMAL HIGH (ref 14–46)
MCH: 28.2 pg (ref 26.6–33.0)
MCHC: 31.4 g/dL — ABNORMAL LOW (ref 31.5–35.7)
MCV: 90 fL (ref 79–97)
MONOCYTES: 5 % (ref 4–13)
NEUTROPHILS: 32 % — ABNORMAL LOW (ref 40–74)
PLATELET: 226 10*3/uL (ref 140–415)
RBC: 4.39 x10E6/uL (ref 3.77–5.28)
RDW: 14.2 % (ref 12.3–15.4)
WBC: 7.9 10*3/uL (ref 4.0–10.5)

## 2011-07-04 LAB — METABOLIC PANEL, COMPREHENSIVE
A-G Ratio: 1.1 (ref 1.1–2.5)
ALT (SGPT): 25 IU/L (ref 0–40)
AST (SGOT): 21 IU/L (ref 0–40)
Albumin: 4.1 g/dL (ref 3.5–5.5)
Alk. phosphatase: 66 IU/L (ref 25–150)
BUN/Creatinine ratio: 22 (ref 9–23)
BUN: 20 mg/dL (ref 6–24)
Bilirubin, total: 0.3 mg/dL (ref 0.0–1.2)
CO2: 24 mmol/L (ref 20–32)
Calcium: 10 mg/dL (ref 8.7–10.2)
Chloride: 96 mmol/L — ABNORMAL LOW (ref 97–108)
Creatinine: 0.93 mg/dL (ref 0.57–1.00)
GFR est non-AA: 73 mL/min/{1.73_m2} (ref >59)
GLOBULIN, TOTAL: 3.7 g/dL (ref 1.5–4.5)
Glucose: 184 mg/dL — ABNORMAL HIGH (ref 65–99)
Potassium: 4.1 mmol/L (ref 3.5–5.2)
Protein, total: 7.8 g/dL (ref 6.0–8.5)
Sodium: 134 mmol/L (ref 134–144)
eGFR If African American: 84 mL/min/{1.73_m2} (ref >59)

## 2011-07-04 LAB — URINALYSIS W/MICROSCOPIC
Bilirubin: NEGATIVE
Blood: NEGATIVE
Glucose: NEGATIVE
Ketone: NEGATIVE
Leukocyte Esterase: NEGATIVE
Nitrites: NEGATIVE
Protein: NEGATIVE
Specific Gravity: 1.024 (ref 1.005–1.030)
Urobilinogen: 0.2 mg/dL (ref 0.0–1.9)
pH (UA): 6 (ref 5.0–7.5)

## 2011-07-04 LAB — LIPID PANEL
Cholesterol, total: 244 mg/dL — ABNORMAL HIGH (ref 100–199)
HDL Cholesterol: 30 mg/dL — ABNORMAL LOW (ref >39)
LDL, calculated: 149 mg/dL — ABNORMAL HIGH (ref 0–99)
Triglyceride: 327 mg/dL — ABNORMAL HIGH (ref 0–149)
VLDL, calculated: 65 mg/dL — ABNORMAL HIGH (ref 5–40)

## 2011-07-04 LAB — MICROSCOPIC EXAMINATION

## 2011-07-04 LAB — HEMOGLOBIN A1C WITH EAG: Hemoglobin A1c: 10.5 % — ABNORMAL HIGH (ref 4.8–5.6)

## 2011-07-04 LAB — AMYLASE: Amylase: 63 U/L (ref 31–124)

## 2011-07-04 LAB — TSH 3RD GENERATION: TSH: 3.43 u[IU]/mL (ref 0.450–4.500)

## 2011-07-05 NOTE — Progress Notes (Signed)
Subjective:  Patient presents for follow up of chronic illnesses and spondylosis, primarily affecting the back. Pain is described as aching, severe, and is intermittent.    Related to injury: no.  Associated symptoms include: decreased ROM.    Rheumatological ROS: ongoing significant pain in back which is stable and controlled by PRN meds.   Pain score without treatment: 8/10  Pain score with treatment: 1/10  Response to treatment plan: stable.   Patient has negative FH and personal use of illicit drugs currently   Tobacco Use: 1/4 ppd  BS are running in mid 100's  Pt is having abdominal pain primarily lower and has seen OB/GYN who feels etiology is more GI.  No aggravating factors.      Current Outpatient Prescriptions   Medication Sig Dispense Refill   ??? oxyCODONE-acetaminophen (PERCOCET) 5-325 mg per tablet Take 1 Tab by mouth two (2) times daily as needed for Pain.  60 Tab  0   ??? varenicline (CHANTIX CONTINUING MONTH BOX) 1 mg tablet Take 1 Tab by mouth two (2) times a day.  60 Tab  2   ??? insulin lispro (HUMALOG KWIKPEN) 100 unit/mL kwikpen 10 units subcutaneous before meals three times a day.  5 Each  5   ??? insulin detemir (LEVEMIR FLEXPEN) 100 unit/mL (3 mL) pen 60 Units by SubCUTAneous route two (2) times a day.  10 Each  5   ??? valsartan (DIOVAN) 160 mg tablet Take 1 Tab by mouth daily.  30 Tab  5   ??? ALPRAZolam (XANAX) 1 mg tablet Take 1 Tab by mouth two (2) times a day.  60 Tab  2   ??? traZODone (DESYREL) 300 mg tablet Take 1 Tab by mouth nightly.  30 Tab  5   ??? cyclobenzaprine (FLEXERIL) 10 mg tablet Take 1 Tab by mouth three (3) times daily as needed for Muscle Spasm(s).  60 Tab  3   ??? fenofibrate (TRICOR) 160 mg tablet Take 1 Tab by mouth daily. Take with food.  30 Tab  5   ??? cloNIDine (CATAPRESS) 0.2 mg tablet take 1 tablet by mouth twice a day  60 Tab  8   ??? simvastatin (ZOCOR) 40 mg tablet Take 1 Tab by mouth nightly.  30 Tab  2    ??? exenatide (BYETTA) 10 mcg/0.04 mL PnIj injection 0.04 mL by SubCUTAneous route Before breakfast and dinner.  3 mL  5   ??? omeprazole (PRILOSEC) 40 mg capsule take 1 capsule by mouth once daily  30 Cap  11   ??? Insulin Needles, Disposable, (NOVOFINE 32) 32 x 1/4 " Ndle 1 Each by Does Not Apply route as directed.  100 Each  5   ??? Blood-Glucose Meter monitoring kit by Does Not Apply route. Use as directed    1 Kit  0   ??? Lancets & Blood Glucose Strips Cmpk 1 Units by Does Not Apply route as directed. Monitor BS BID Dx: 250.02  100 Units  5   ??? promethazine (PHENERGAN) 25 mg tablet take 25 mg by mouth every six (6) hours as needed for Nausea.         Allergies   Allergen Reactions   ??? Niaspan (Niacin) Rash   ??? Aspirin Nausea Only   ??? Ibuprofen Swelling     Past Medical History   Diagnosis Date   ??? Asthma    ??? GERD (gastroesophageal reflux disease)    ??? Hypertension    ??? Psychotic disorder    ???  Cyst      excision of a chronic abscess of left axilla     Past Surgical History   Procedure Date   ??? Pr abdomen surgery proc unlisted    ??? Hx heent      Family History   Problem Relation Age of Onset   ??? Diabetes Mother    ??? Hypertension Mother    ??? Stroke Mother    ??? Cancer Mother      kidney   ??? Cancer Father    ??? Stroke Father    ??? Depression Sister    ??? Depression Brother      History   Substance Use Topics   ??? Smoking status: Current Everyday Smoker -- 0.5 packs/day     Types: Cigarettes   ??? Smokeless tobacco: Never Used   ??? Alcohol Use: No        Review of Systems  A comprehensive review of systems was negative except for that written in the HPI.    Objective:    The patient appears well, alert, oriented x 3, in no distress.   BP 110/80   Pulse 90   Resp 18   Wt 249 lb (112.946 kg)   HEENT: WNL  Chest: Lungs are clear, good air entry, no wheezes, rhonchi or rales.   CV: S1 and S2 normal, no murmurs, rubs or gallops, regular rate and rhythm.   Abdomen: soft without tenderness, guarding, mass or organomegaly.    Musculoskeletal:  Back: + lower lumbar and para lumbar tenderness with limited ROM.   Extremities: show no edema, normal peripheral pulses. + varicose veins esp L leg  Neurological is normal without focal findings.   Functional Assessment: Patient is ambulatory and accomplishing tasks of IADL's and ADL's which she had difficulty with prior to treatment.    Impression/Plan  1. DM w/o Complication Type II  HEMOGLOBIN A1C, METABOLIC PANEL, COMPREHENSIVE   2. Abdominal pain, unspecified site  URINALYSIS W/MICROSCOPIC, CBC WITH AUTOMATED DIFF, METABOLIC PANEL, COMPREHENSIVE, AMYLASE, REFERRAL TO GASTROENTEROLOGY   3. Bipolar Affective, Depress, Unspec     4. Essential hypertension, benign     5. Lumbago  oxyCODONE-acetaminophen (PERCOCET) 5-325 mg per tablet, DRUG PROFILE, UR, 9 DRUGS, DRUG PROFILE, UR, 9 DRUGS   6. Mixed hyperlipidemia  LIPID PANEL, METABOLIC PANEL, COMPREHENSIVE, TSH, 3RD GENERATION   7. Encounter for long-term (current) use of other medications  DRUG PROFILE, UR, 9 DRUGS, DRUG PROFILE, UR, 9 DRUGS, MICROSCOPIC EXAMINATION   8. Tobacco use disorder  varenicline (CHANTIX CONTINUING MONTH BOX) 1 mg tablet       Pain contract in place.  PMP reviewed  Last Random Drug screening: today  Side effects and use of medications discussed and patient understands and agrees with plan.  Pt is achieving goals of treatment such as ambulating and able to care for self with minimal pain at this time on medication.  Specialty care: None at this time needed.  Diet and exercise discussed.        Continue current medications.        Patient expressed understanding with the diagnosis and plan.  Pt advised to quit smoking.   Follow up in 1 month/prn.

## 2011-07-10 NOTE — Telephone Encounter (Signed)
Patient would like to know her lab results.  She can be reached at [phone].

## 2011-07-13 NOTE — Telephone Encounter (Signed)
Pt called and explained lab results on Friday, 07/10/11

## 2011-07-16 NOTE — Progress Notes (Signed)
Quick Note:    Labs stable/normal except elevated blood sugar and cholesterol and HbA1C. Will discuss at next visit. Pt called with results.  ______

## 2011-07-27 MED ORDER — SIMETHICONE 40 MG/0.6 ML ORAL DROPS, SUSP
40 mg/0.6 mL | ORAL | Status: DC | PRN
Start: 2011-07-27 — End: 2011-07-27

## 2011-07-27 MED ORDER — FLUMAZENIL 0.1 MG/ML IV SOLN
0.1 mg/mL | INTRAVENOUS | Status: DC | PRN
Start: 2011-07-27 — End: 2011-07-27

## 2011-07-27 MED ORDER — DIPHENHYDRAMINE HCL 50 MG/ML IJ SOLN
50 mg/mL | Freq: Once | INTRAMUSCULAR | Status: AC
Start: 2011-07-27 — End: 2011-07-27
  Administered 2011-07-27: 13:00:00 via INTRAVENOUS

## 2011-07-27 MED ORDER — MIDAZOLAM 1 MG/ML IJ SOLN
1 mg/mL | INTRAMUSCULAR | Status: DC | PRN
Start: 2011-07-27 — End: 2011-07-27
  Administered 2011-07-27 (×2): via INTRAVENOUS

## 2011-07-27 MED ORDER — FENTANYL CITRATE (PF) 50 MCG/ML IJ SOLN
50 mcg/mL | INTRAMUSCULAR | Status: DC | PRN
Start: 2011-07-27 — End: 2011-07-27
  Administered 2011-07-27 (×2): via INTRAVENOUS

## 2011-07-27 MED ORDER — NALOXONE 0.4 MG/ML INJECTION
0.4 mg/mL | INTRAMUSCULAR | Status: DC | PRN
Start: 2011-07-27 — End: 2011-07-27

## 2011-07-27 NOTE — Other (Signed)
ABD remains soft and non-tender post procedure.  Pt has no complaints at this time and tolerated the procedure well.

## 2011-07-27 NOTE — H&P (Signed)
Gastroenterology Outpatient History and Physical    Patient: Mary Mayo    Physician: Ian Malkin, MD    Vital Signs: Blood pressure 108/57, pulse 90, resp. rate 20, height 5' 2.5" (1.588 m), weight 111.131 kg (245 lb), SpO2 100.00%.    Allergies:   Allergies   Allergen Reactions   ??? Niaspan (Niacin) Rash   ??? Aspirin Nausea Only   ??? Ibuprofen Swelling       Chief Complaint: pain    History of Present Illness: persistent and recurrent RLQ pains; absence of diagnosis with radiologic studies    Justification for Procedure: persistent symptoms    History:  Past Medical History   Diagnosis Date   ??? Asthma    ??? GERD (gastroesophageal reflux disease)    ??? Hypertension    ??? Psychotic disorder      bipolar   ??? Cyst      excision of a chronic abscess of left axilla   ??? Diabetes       Past Surgical History   Procedure Date   ??? Hx tonsillectomy    ??? Pr abdomen surgery proc unlisted    ??? Hx gyn 2004     hysterectomy      History     Social History   ??? Marital Status: Divorced     Spouse Name: N/A     Number of Children: N/A   ??? Years of Education: N/A     Social History Main Topics   ??? Smoking status: Former Smoker -- 0.5 packs/day for 30 years     Types: Cigarettes     Quit date: 06/21/2011   ??? Smokeless tobacco: Never Used   ??? Alcohol Use: No   ??? Drug Use: No   ??? Sexually Active: Not Currently     Other Topics Concern   ??? Not on file     Social History Narrative   ??? No narrative on file      Family History   Problem Relation Age of Onset   ??? Diabetes Mother    ??? Hypertension Mother    ??? Stroke Mother    ??? Cancer Mother      kidney   ??? Cancer Father    ??? Stroke Father    ??? Depression Sister    ??? Depression Brother        Medications:   Prior to Admission medications    Medication Sig Start Date End Date Taking? Authorizing Provider   oxyCODONE-acetaminophen (PERCOCET) 5-325 mg per tablet Take 1 Tab by mouth two (2) times daily as needed for Pain. 07/02/11  Yes Julaine Fusi, MD    insulin lispro (HUMALOG KWIKPEN) 100 unit/mL kwikpen 10 units subcutaneous before meals three times a day. 06/01/11  Yes Julaine Fusi, MD   insulin detemir (LEVEMIR FLEXPEN) 100 unit/mL (3 mL) pen 60 Units by SubCUTAneous route two (2) times a day. 06/01/11  Yes Julaine Fusi, MD   valsartan (DIOVAN) 160 mg tablet Take 1 Tab by mouth daily. 06/01/11  Yes Julaine Fusi, MD   ALPRAZolam Prudy Feeler) 1 mg tablet Take 1 Tab by mouth two (2) times a day. 04/30/11  Yes Julaine Fusi, MD   traZODone (DESYREL) 300 mg tablet Take 1 Tab by mouth nightly. 04/30/11  Yes Julaine Fusi, MD   cyclobenzaprine (FLEXERIL) 10 mg tablet Take 1 Tab by mouth three (3) times daily as needed for Muscle Spasm(s). 04/30/11  Yes Julaine Fusi,  MD   fenofibrate (TRICOR) 160 mg tablet Take 1 Tab by mouth daily. Take with food. 04/06/11  Yes Julaine Fusi, MD   cloNIDine (CATAPRESS) 0.2 mg tablet take 1 tablet by mouth twice a day 03/16/11  Yes Julaine Fusi, MD   simvastatin (ZOCOR) 40 mg tablet Take 1 Tab by mouth nightly. 02/03/11  Yes Julaine Fusi, MD   exenatide (BYETTA) 10 mcg/0.04 mL PnIj injection 0.04 mL by SubCUTAneous route Before breakfast and dinner. 12/04/10  Yes Julaine Fusi, MD   omeprazole (PRILOSEC) 40 mg capsule take 1 capsule by mouth once daily 11/17/10  Yes Julaine Fusi, MD   Insulin Needles, Disposable, (NOVOFINE 32) 32 x 1/4 " Ndle 1 Each by Does Not Apply route as directed. 11/05/10   Julaine Fusi, MD   Blood-Glucose Meter monitoring kit by Does Not Apply route. Use as directed   12/12/09   Julaine Fusi, MD   Lancets & Blood Glucose Strips Cmpk 1 Units by Does Not Apply route as directed. Monitor BS BID Dx: 250.02 12/12/09   Julaine Fusi, MD   promethazine (PHENERGAN) 25 mg tablet take 25 mg by mouth every six (6) hours as needed for Nausea.    Historical Provider       Physical Exam:   General: alert, cooperative, no distress   HEENT: Head: Normocephalic, no lesions, without obvious abnormality.    Heart: regular rate and rhythm, S1, S2 normal, no murmur, click, rub or gallop   Lungs: chest clear, no wheezing, rales, normal symmetric air entry   Abdominal: Bowel sounds are normal, liver is not enlarged, spleen is not enlarged   Neurological: Grossly normal   Extremities: extremities normal, atraumatic, no cyanosis or edema     Findings/Diagnosis: pain    Plan of Care/Planned Procedure: I've discussed colonoscopy possible biopsy, polypectomy, cautery, injection, alternatives, complications including but not limited to pain, cardiopulmonary event, bleeding, perforation requiring additional blood transfusion or operative repair; all questions answered.     Signed By: Ian Malkin, MD     July 27, 2011

## 2011-07-27 NOTE — Progress Notes (Signed)
Mary Mayo  02/17/1965  [id number]    Situation:  Verbal report received from: L. Parker rn  Procedure:  COLONOSCOPY - COLONOSCOPY      Background:    Preoperative diagnosis: ABDOMINAL PAIN/ABNORMAL GI XRAY    Postoperative diagnosis: Normal colon exam    Operator:  Dr. Ian Malkin, MD    Assistant(s): Lorel Monaco Deusebio - Endoscopy Technician-1  Georgia Dom, RN - Endoscopy RN-1  Patrica Duel - Endoscopy Technician-1    Specimens: * No specimens in log *  H. Plyori  no    Assessment:  Intra-procedure medications   Versed 6 mg  Benadryl 50 mg  Fentanyl 100 mcg    Intravenous fluids: NS@ KVO     Vital signs stable     Abdominal assessment: round and soft     Recommendation:  Discharge patient per MD order.    Family or Friend   Permission to share finding with family or friend yes

## 2011-07-27 NOTE — Procedures (Signed)
Procedures by Ian Malkin, MD at 07/27/11 0930                Author: Ian Malkin, MD  Service: --  Author Type: Physician       Filed: 07/27/11 0932  Date of Service: 07/27/11 0930  Status: Signed          Editor: Ian Malkin, MD (Physician)            Pre-procedure Diagnoses        1. Abdominal pain, right lower quadrant [789.03]                           Post-procedure Diagnoses        1. Abdominal pain, right lower quadrant [789.03]                           Procedures        1. COLONOSCOPY,DIAGNOSTIC [16109 (CPT??)]                                                            [facility]   [place] - [facility]   Ian Malkin, MD   [phone]        July 27, 2011      Colonoscopy Procedure Note   Mary Mayo   DOB:  02/17/1965   [facility] Medical Record Number: [id number]      Indications:     Abdominal pain, RLQ - 789.03   PCP:  Julaine Fusi   Anesthesia/Sedation: Conscious Sedation/Moderate Sedation   Endoscopist:  Dr. Ian Malkin   Complications:  None   Estimate Blood Loss:  None      Permit:   The indications, risks, benefits and alternatives were reviewed with the patient or their decision maker who was provided an opportunity to ask questions  and all questions were answered.  The specific risks of colonoscopy with conscious sedation were reviewed, including but not limited to anesthetic complication, bleeding, adverse drug reaction, missed lesion, infection, IV site reactions, and intestinal  perforation which would lead to the need for surgical repair.  Alternatives to colonoscopy including radiographic imaging, observation without testing, or laboratory testing were reviewed including the limitations of those alternatives.  After considering  the options and having all their questions answered, the patient or their decision maker provided both verbal and written consent to proceed.           Procedure in Detail:   After obtaining informed consent,  positioning of the patient in the left lateral decubitus position, and conduction of a pre-procedure pause or "time out" the endoscope was introduced into the anus and advanced to the terminal ileum which is examined  for at least 30 cm.  The quality of the colonic preparation was good.  A careful inspection was made as the colonoscope was withdrawn, findings and interventions are described below.      Findings:    normal      Specimens:     none      Complications:    None; patient tolerated the procedure well.      Impression:   Normal colonoscopy to the terminal ileum, with no evidence  of neoplasia, diverticular disease, or mucosal abnormality.      Recommendations:      -  follow up Dr Pollie Friar for non intestinal pains; she is reminded of need for permanent adherence to a low fat diet to avoid liver disease in the future      Thank you for entrusting me with this patient's care.  Please do not hesitate to contact me with any questions or if I can be of assistance with any of your other patients' GI needs.      Signed By: Ian Malkin, MD                         July 27, 2011

## 2011-07-27 NOTE — Procedures (Signed)
[facility]  [place] - [facility]  Ian Malkin, MD  [phone]      July 27, 2011    Colonoscopy Procedure Note  Mary Mayo  DOB:  02/17/1965  [facility] Medical Record Number: [id number]    Indications:     Abdominal pain, RLQ - 789.03  PCP:  Julaine Fusi  Anesthesia/Sedation: Conscious Sedation/Moderate Sedation  Endoscopist:  Dr. Ian Malkin  Complications:  None  Estimate Blood Loss:  None    Permit:  The indications, risks, benefits and alternatives were reviewed with the patient or their decision maker who was provided an opportunity to ask questions and all questions were answered.  The specific risks of colonoscopy with conscious sedation were reviewed, including but not limited to anesthetic complication, bleeding, adverse drug reaction, missed lesion, infection, IV site reactions, and intestinal perforation which would lead to the need for surgical repair.  Alternatives to colonoscopy including radiographic imaging, observation without testing, or laboratory testing were reviewed including the limitations of those alternatives.  After considering the options and having all their questions answered, the patient or their decision maker provided both verbal and written consent to proceed.        Procedure in Detail:  After obtaining informed consent, positioning of the patient in the left lateral decubitus position, and conduction of a pre-procedure pause or "time out" the endoscope was introduced into the anus and advanced to the terminal ileum which is examined for at least 30 cm.  The quality of the colonic preparation was good.  A careful inspection was made as the colonoscope was withdrawn, findings and interventions are described below.    Findings:   normal    Specimens:    none    Complications:   None; patient tolerated the procedure well.    Impression:   Normal colonoscopy to the terminal ileum, with no evidence of neoplasia, diverticular disease, or mucosal abnormality.    Recommendations:     -  follow up Dr Pollie Friar for non intestinal pains; she is reminded of need for permanent adherence to a low fat diet to avoid liver disease in the future    Thank you for entrusting me with this patient's care.  Please do not hesitate to contact me with any questions or if I can be of assistance with any of your other patients' GI needs.    Signed By: Ian Malkin, MD                        July 27, 2011

## 2011-08-03 MED ORDER — OXYCODONE-ACETAMINOPHEN 5 MG-325 MG TAB
5-325 mg | ORAL_TABLET | Freq: Two times a day (BID) | ORAL | Status: DC | PRN
Start: 2011-08-03 — End: 2011-09-02

## 2011-08-03 MED ORDER — INSULIN DETEMIR 100 UNIT/ML (3 ML) SUB-Q PEN
100 unit/mL (3 mL) | Freq: Two times a day (BID) | SUBCUTANEOUS | Status: DC
Start: 2011-08-03 — End: 2011-12-02

## 2011-08-03 MED ORDER — SIMVASTATIN 40 MG TAB
40 mg | ORAL_TABLET | Freq: Every evening | ORAL | Status: DC
Start: 2011-08-03 — End: 2012-10-25

## 2011-08-03 MED ORDER — ALPRAZOLAM 1 MG TAB
1 mg | ORAL_TABLET | Freq: Two times a day (BID) | ORAL | Status: DC
Start: 2011-08-03 — End: 2011-11-02

## 2011-08-03 MED ORDER — FENOFIBRATE 160 MG TAB
160 mg | ORAL_TABLET | Freq: Every day | ORAL | Status: DC
Start: 2011-08-03 — End: 2012-10-25

## 2011-08-04 NOTE — Progress Notes (Signed)
Subjective:  Patient presents for follow up of chronic illnesses and spondylosis, primarily affecting the back. Pain is described as aching, severe, and is intermittent.    Related to injury: no.  Associated symptoms include: decreased ROM.    Rheumatological ROS: ongoing significant pain in back which is stable and controlled by PRN meds.   Pain score without treatment: 8/10  Pain score with treatment: 1/10  Response to treatment plan: stable.   Patient has negative FH and personal use of illicit drugs currently   Tobacco Use: 1/4 ppd  BS are running in low 100's on 80 units BID of Levemir  Pt saw GI for lower abdominal pain.      Current Outpatient Prescriptions   Medication Sig Dispense Refill   ??? fenofibrate (TRICOR) 160 mg tablet Take 1 Tab by mouth daily. Take with food.  30 Tab  5   ??? simvastatin (ZOCOR) 40 mg tablet Take 1 Tab by mouth nightly.  30 Tab  5   ??? ALPRAZolam (XANAX) 1 mg tablet Take 1 Tab by mouth two (2) times a day.  60 Tab  3   ??? insulin detemir (LEVEMIR FLEXPEN) 100 unit/mL (3 mL) pen 80 Units by SubCUTAneous route two (2) times a day.  10 Each  3   ??? oxyCODONE-acetaminophen (PERCOCET) 5-325 mg per tablet Take 1 Tab by mouth two (2) times daily as needed for Pain.  60 Tab  0   ??? insulin lispro (HUMALOG KWIKPEN) 100 unit/mL kwikpen 10 units subcutaneous before meals three times a day.  5 Each  5   ??? valsartan (DIOVAN) 160 mg tablet Take 1 Tab by mouth daily.  30 Tab  5   ??? traZODone (DESYREL) 300 mg tablet Take 1 Tab by mouth nightly.  30 Tab  5   ??? cyclobenzaprine (FLEXERIL) 10 mg tablet Take 1 Tab by mouth three (3) times daily as needed for Muscle Spasm(s).  60 Tab  3   ??? cloNIDine (CATAPRESS) 0.2 mg tablet take 1 tablet by mouth twice a day  60 Tab  8   ??? exenatide (BYETTA) 10 mcg/0.04 mL PnIj injection 0.04 mL by SubCUTAneous route Before breakfast and dinner.  3 mL  5   ??? omeprazole (PRILOSEC) 40 mg capsule take 1 capsule by mouth once daily  30 Cap  11    ??? Insulin Needles, Disposable, (NOVOFINE 32) 32 x 1/4 " Ndle 1 Each by Does Not Apply route as directed.  100 Each  5   ??? Blood-Glucose Meter monitoring kit by Does Not Apply route. Use as directed    1 Kit  0   ??? Lancets & Blood Glucose Strips Cmpk 1 Units by Does Not Apply route as directed. Monitor BS BID Dx: 250.02  100 Units  5   ??? promethazine (PHENERGAN) 25 mg tablet take 25 mg by mouth every six (6) hours as needed for Nausea.         Allergies   Allergen Reactions   ??? Niaspan (Niacin) Rash   ??? Aspirin Nausea Only   ??? Ibuprofen Swelling     Past Medical History   Diagnosis Date   ??? Asthma    ??? GERD (gastroesophageal reflux disease)    ??? Hypertension    ??? Psychotic disorder      bipolar   ??? Cyst      excision of a chronic abscess of left axilla   ??? Diabetes      Past Surgical History  Procedure Date   ??? Hx tonsillectomy    ??? Pr abdomen surgery proc unlisted    ??? Hx gyn 2004     hysterectomy     Family History   Problem Relation Age of Onset   ??? Diabetes Mother    ??? Hypertension Mother    ??? Stroke Mother    ??? Cancer Mother      kidney   ??? Cancer Father    ??? Stroke Father    ??? Depression Sister    ??? Depression Brother      History   Substance Use Topics   ??? Smoking status: Former Smoker -- 0.5 packs/day for 30 years     Types: Cigarettes     Quit date: 06/21/2011   ??? Smokeless tobacco: Never Used   ??? Alcohol Use: No        Review of Systems  A comprehensive review of systems was negative except for that written in the HPI.    Objective:    The patient appears well, alert, oriented x 3, in no distress.   BP 106/71   Pulse 93   Temp(Src) 97.9 ??F (36.6 ??C) (Oral)   Resp 16   Ht 5' 2.5" (1.588 m)   Wt 254 lb (115.214 kg)   BMI 45.72 kg/m2   SpO2 98%   HEENT: WNL  Chest: Lungs are clear, good air entry, no wheezes, rhonchi or rales.   CV: S1 and S2 normal, no murmurs, rubs or gallops, regular rate and rhythm.   Abdomen: soft without tenderness, guarding, mass or organomegaly.    Musculoskeletal:  Back: + lower lumbar and para lumbar tenderness with limited ROM.   Extremities: show no edema, normal peripheral pulses. + varicose veins esp L leg  Neurological is normal without focal findings.   Functional Assessment: Patient is ambulatory and accomplishing tasks of IADL's and ADL's which she had difficulty with prior to treatment.    Impression/Plan  1. Mixed hyperlipidemia  fenofibrate (TRICOR) 160 mg tablet, simvastatin (ZOCOR) 40 mg tablet   2. Bipolar I disorder, most recent episode (or current) depressed, unspecified  ALPRAZolam (XANAX) 1 mg tablet   3. Type II or unspecified type diabetes mellitus without mention of complication, uncontrolled  insulin detemir (LEVEMIR FLEXPEN) 100 unit/mL (3 mL) pen   4. Essential hypertension, benign     5. Microalbuminuria     6. Lumbago  oxyCODONE-acetaminophen (PERCOCET) 5-325 mg per tablet       Pain contract in place.  PMP reviewed  Last Random Drug screening: 7/12  Side effects and use of medications discussed and patient understands and agrees with plan.  Pt is achieving goals of treatment such as ambulating and able to care for self with minimal pain at this time on medication.  Specialty care: None at this time needed.  Diet and exercise discussed.        Continue current medications.        Patient expressed understanding with the diagnosis and plan.  Pt advised to quit smoking.   Follow up in 1 month/prn.

## 2011-08-21 MED ORDER — CYCLOBENZAPRINE 10 MG TAB
10 mg | ORAL_TABLET | ORAL | Status: DC
Start: 2011-08-21 — End: 2011-11-02

## 2011-09-02 MED ORDER — TRAZODONE 300 MG TAB
300 mg | ORAL_TABLET | Freq: Every evening | ORAL | Status: DC
Start: 2011-09-02 — End: 2011-11-02

## 2011-09-02 MED ORDER — OXYCODONE-ACETAMINOPHEN 5 MG-325 MG TAB
5-325 mg | ORAL_TABLET | Freq: Two times a day (BID) | ORAL | Status: DC | PRN
Start: 2011-09-02 — End: 2011-10-02

## 2011-09-02 MED ORDER — CLOBETASOL 0.05 % TOPICAL CREAM
0.05 % | Freq: Two times a day (BID) | CUTANEOUS | Status: DC
Start: 2011-09-02 — End: 2012-05-27

## 2011-09-02 MED ORDER — SAXAGLIPTIN 2.5 MG-METFORMIN ER 1,000 MG TABLET,EXTEND RELEASE 24HR MP
2.5-1000 mg | ORAL_TABLET | Freq: Every day | ORAL | Status: DC
Start: 2011-09-02 — End: 2011-09-03

## 2011-09-02 MED ORDER — OMEPRAZOLE 40 MG CAP, DELAYED RELEASE
40 mg | ORAL_CAPSULE | Freq: Every day | ORAL | Status: DC
Start: 2011-09-02 — End: 2012-09-13

## 2011-09-02 MED ORDER — VALSARTAN 160 MG TAB
160 mg | ORAL_TABLET | Freq: Every day | ORAL | Status: DC
Start: 2011-09-02 — End: 2012-10-25

## 2011-09-03 MED ORDER — METFORMIN ER 1,000 MG 24 HR TABLET,EXTENDED RELEASE
1000 mg | ORAL_TABLET | Freq: Every day | ORAL | Status: DC
Start: 2011-09-03 — End: 2012-03-01

## 2011-09-03 NOTE — Telephone Encounter (Signed)
Patient called stating she cannot afford the Kombiglyze and would like to know if she can try something different.  She stated the pharmacy was to fax this request over and she is following up.  She can be reached at [phone]. Thank you

## 2011-09-03 NOTE — Telephone Encounter (Signed)
Called pt's insurance Metformin HCL is covered under a Tier 1 which is the cheapest Tier through her insurance.

## 2011-09-03 NOTE — Telephone Encounter (Signed)
Please find out if there is anything similar that would be more affordable based on insurance.

## 2011-09-05 NOTE — Progress Notes (Signed)
Subjective:  Patient presents for follow up of chronic illnesses and spondylosis, primarily affecting the back. Pain is described as aching, severe, and is intermittent.    Related to injury: no.  Associated symptoms include: decreased ROM.    Rheumatological ROS: ongoing significant pain in back which is stable and controlled by PRN meds.   Pain score without treatment: 8/10  Pain score with treatment: 1/10  Response to treatment plan: stable.   Patient has negative FH and personal use of illicit drugs currently   Tobacco Use: 1/4 ppd  BS are controlled on 80 units BID of Levemir but is concerned about wt gain on medication.  Pt also complains of rash on ant. Lower neck and upper chest for 3 days which is mildly pruritic.  Pt denies new soaps, detergents or environmental changes.  Pt has also had loose stools for 4 days.      Current Outpatient Prescriptions   Medication Sig Dispense Refill   ??? oxyCODONE-acetaminophen (PERCOCET) 5-325 mg per tablet Take 1 Tab by mouth two (2) times daily as needed for Pain.  60 Tab  0   ??? traZODone (DESYREL) 300 mg tablet Take 1 Tab by mouth nightly.  30 Tab  5   ??? omeprazole (PRILOSEC) 40 mg capsule Take 1 Cap by mouth daily.  30 Cap  11   ??? valsartan (DIOVAN) 160 mg tablet Take 1 Tab by mouth daily.  30 Tab  5   ??? clobetasol (TEMOVATE) 0.05 % topical cream Apply  to affected area two (2) times a day.  30 g  1   ??? cyclobenzaprine (FLEXERIL) 10 mg tablet take 1 tablet by mouth three times a day if needed for muscle spasm  60 Tab  3   ??? fenofibrate (TRICOR) 160 mg tablet Take 1 Tab by mouth daily. Take with food.  30 Tab  5   ??? simvastatin (ZOCOR) 40 mg tablet Take 1 Tab by mouth nightly.  30 Tab  5   ??? ALPRAZolam (XANAX) 1 mg tablet Take 1 Tab by mouth two (2) times a day.  60 Tab  3   ??? insulin detemir (LEVEMIR FLEXPEN) 100 unit/mL (3 mL) pen 80 Units by SubCUTAneous route two (2) times a day.  10 Each  3    ??? insulin lispro (HUMALOG KWIKPEN) 100 unit/mL kwikpen 10 units subcutaneous before meals three times a day.  5 Each  5   ??? cloNIDine (CATAPRESS) 0.2 mg tablet take 1 tablet by mouth twice a day  60 Tab  8   ??? Insulin Needles, Disposable, (NOVOFINE 32) 32 x 1/4 " Ndle 1 Each by Does Not Apply route as directed.  100 Each  5   ??? Blood-Glucose Meter monitoring kit by Does Not Apply route. Use as directed    1 Kit  0   ??? Lancets & Blood Glucose Strips Cmpk 1 Units by Does Not Apply route as directed. Monitor BS BID Dx: 250.02  100 Units  5   ??? promethazine (PHENERGAN) 25 mg tablet take 25 mg by mouth every six (6) hours as needed for Nausea.       ??? Metformin 1,000 mg TG24 Take 2 Tabs by mouth daily (after dinner).  60 Tab  3     Allergies   Allergen Reactions   ??? Niaspan (Niacin) Rash   ??? Aspirin Nausea Only   ??? Ibuprofen Swelling     Past Medical History   Diagnosis Date   ??? Asthma    ???  GERD (gastroesophageal reflux disease)    ??? Hypertension    ??? Psychotic disorder      bipolar   ??? Cyst      excision of a chronic abscess of left axilla   ??? Diabetes      Past Surgical History   Procedure Date   ??? Hx tonsillectomy    ??? Pr abdomen surgery proc unlisted    ??? Hx gyn 2004     hysterectomy     Family History   Problem Relation Age of Onset   ??? Diabetes Mother    ??? Hypertension Mother    ??? Stroke Mother    ??? Cancer Mother      kidney   ??? Cancer Father    ??? Stroke Father    ??? Depression Sister    ??? Depression Brother      History   Substance Use Topics   ??? Smoking status: Former Smoker -- 0.5 packs/day for 30 years     Types: Cigarettes     Quit date: 06/21/2011   ??? Smokeless tobacco: Never Used   ??? Alcohol Use: No        Review of Systems  A comprehensive review of systems was negative except for that written in the HPI.    Objective:    The patient appears well, alert, oriented x 3, in no distress.   BP 126/76   Pulse 96   Ht 5' 2.5" (1.588 m)   Wt 258 lb 6.4 oz (117.209 kg)   BMI 46.51 kg/m2   HEENT: WNL   Chest: Lungs are clear, good air entry, no wheezes, rhonchi or rales.   CV: S1 and S2 normal, no murmurs, rubs or gallops, regular rate and rhythm.   Abdomen: soft without tenderness, guarding, mass or organomegaly.   Musculoskeletal:  Back: + lower lumbar and para lumbar tenderness with limited ROM.   Extremities: show no edema, normal peripheral pulses. + varicose veins esp L leg  Neurological is normal without focal findings.   Skin: + Erythematous raised patches of upper ant chest and lower neck.  Functional Assessment: Patient is ambulatory and accomplishing tasks of IADL's and ADL's which she had difficulty with prior to treatment.    Impression/Plan  1. Type II or unspecified type diabetes mellitus without mention of complication, uncontrolled  Add Metformin ER 1000 mg BID and decrease insulin dose if controlled.   2. Mixed hyperlipidemia     3. Essential hypertension, benign  valsartan (DIOVAN) 160 mg tablet   4. Lumbago  oxyCODONE-acetaminophen (PERCOCET) 5-325 mg per tablet   5. Bipolar I disorder, most recent episode (or current) depressed, unspecified  traZODone (DESYREL) 300 mg tablet   6. Esophageal reflux  omeprazole (PRILOSEC) 40 mg capsule   7. Contact dermatitis and other eczema, due to unspecified cause  clobetasol (TEMOVATE) 0.05 % topical cream       Pain contract in place.  PMP reviewed  Last Random Drug screening: 07/02/11  Side effects and use of medications discussed and patient understands and agrees with plan.  Pt is achieving goals of treatment such as ambulating and able to care for self with minimal pain at this time on medication.  Specialty care: None at this time needed.  Diet and exercise discussed.        Continue other current medications.        Patient expressed understanding with the diagnosis and plan.  Pt advised to quit smoking.   Follow up  in 1 month/prn.

## 2011-10-02 MED ORDER — OXYCODONE-ACETAMINOPHEN 5 MG-325 MG TAB
5-325 mg | ORAL_TABLET | Freq: Two times a day (BID) | ORAL | Status: DC | PRN
Start: 2011-10-02 — End: 2011-11-02

## 2011-10-02 NOTE — Progress Notes (Signed)
Patient is here for follow up visit

## 2011-10-02 NOTE — Progress Notes (Signed)
Subjective   Patient presents for follow up of chronic illnesses including Hypertension, Hypercholesterolemia and DM, Type II.  Patient checks BS regularly and they have been under better/good control.  Patient has no specific complaints today except skin lesion on R side for a few days - asymptomatic.  Back pain is controlled on medication. Son is in jail due to friend's assault on another person.  ROS:   History obtained from the patient   General: negative for - chills, fever or malaise   HEENT: no sores in mouth, vision problems or ear problems   Respiratory: no shortness of breath, cough, or wheezing   Cardiovascular: no chest pain or dyspnea on exertion or edema   Gastrointestinal: no abdominal pain, change in bowel habits, or black or bloody stools   Neurological: no paresthesias, weakness or dizzyness    Objective  PE:  The patient appears well, alert, oriented x 3, in no distress.   BP 111/75   Pulse 112   Temp(Src) 97.7 ??F (36.5 ??C) (Oral)   Resp 20   Ht 5' 2.5" (1.588 m)   Wt 247 lb (112.038 kg)   BMI 44.46 kg/m2   SpO2 99%  HEENT:  within normal limits  Neck: supple. No adenopathy, thyromegaly or carotid bruits.   Chest:  Lungs are clear, good air entry, no wheezes, rhonchi or rales.   CV:  Regular rate and rhythm, no murmurs, rubs or gallops.   Abdomen: soft without tenderness, guarding, mass or organomegaly.   Ext:  No clicks, clubbing or edema  Periph pulses NL.   Skin: 1 cm vesicle on erythematous base of R lateral abdomen, NT    Impression/Plan  1. DM w/o Complication Type II     2. Essential hypertension, benign     3. Esophageal reflux     4. Lumbago  oxyCODONE-acetaminophen (PERCOCET) 5-325 mg per tablet       Pain contract signed and PMP reviewed  Last urine drug screen - 7/12.  Pt advised to see opthalmologist yearly.  Pt is back smoking and advised to quit and advised to continue ADA diet and regular exercise program.  Continue medications as discussed.  Use antibiotic ointment.   Reviewed plan of care, patient has provided input and agrees with goals  Patient expressed understanding with the diagnosis and plan.  Recheck in 3-4 months.

## 2011-11-02 MED ORDER — ALPRAZOLAM 1 MG TAB
1 mg | ORAL_TABLET | Freq: Two times a day (BID) | ORAL | Status: DC
Start: 2011-11-02 — End: 2012-03-01

## 2011-11-02 MED ORDER — TRAZODONE 300 MG TAB
300 mg | ORAL_TABLET | Freq: Every evening | ORAL | Status: DC
Start: 2011-11-02 — End: 2012-03-31

## 2011-11-02 MED ORDER — CYCLOBENZAPRINE 10 MG TAB
10 mg | ORAL_TABLET | Freq: Three times a day (TID) | ORAL | Status: DC | PRN
Start: 2011-11-02 — End: 2012-02-11

## 2011-11-02 MED ORDER — OXYCODONE-ACETAMINOPHEN 5 MG-325 MG TAB
5-325 mg | ORAL_TABLET | Freq: Two times a day (BID) | ORAL | Status: DC | PRN
Start: 2011-11-02 — End: 2011-12-02

## 2011-11-02 NOTE — Progress Notes (Signed)
Subjective   Patient presents for follow up of chronic illnesses including Hypertension, Hypercholesterolemia and DM, Type II.  Patient checks BS regularly and they have been under better/good control.  Patient has no specific complaints today except dry cough after URI.  Back pain is controlled on medication. + increased stress due to son is in jail due to friend's assault on another person.  ROS:   History obtained from the patient   General: negative for - chills, fever or malaise   HEENT: no sores in mouth, vision problems or ear problems   Respiratory: no shortness of breath,  or wheezing   Cardiovascular: no chest pain or dyspnea on exertion or edema   Gastrointestinal: no abdominal pain, change in bowel habits, or black or bloody stools   Neurological: no paresthesias, weakness or dizzyness    Objective  PE:  The patient appears well, alert, oriented x 3, in no distress. Morbidly obese  BP 116/83   Pulse 100   Temp(Src) 96.8 ??F (36 ??C) (Oral)   Ht 5' 2.5" (1.588 m)   Wt 251 lb 9.6 oz (114.125 kg)   BMI 45.28 kg/m2   SpO2 98%  HEENT:  within normal limits  Neck: supple. No adenopathy, thyromegaly or carotid bruits.   Chest:  Lungs are clear, good air entry, no wheezes, rhonchi or rales.   CV:  Regular rate and rhythm, no murmurs, rubs or gallops.   Abdomen: soft without tenderness, guarding, mass or organomegaly.   Ext:  No clicks, clubbing or edema  Periph pulses NL. Normal Monofilament exam of feet  Back: + lower lumbar and para lumbar tenderness with limited ROM.   Neuro - +2/4 DTR's LE's, 5/5 strength and normal sensation to LT/PP LE's and negative SLR to 90 degrees.     Impression/Plan  1. DM w/o Complication Type II  METABOLIC PANEL, COMPREHENSIVE, HEMOGLOBIN A1C   2. Essential hypertension, benign  METABOLIC PANEL, COMPREHENSIVE, CBC W/O DIFF   3. Mixed hyperlipidemia  METABOLIC PANEL, COMPREHENSIVE, LIPID PANEL    4. Lumbago  oxyCODONE-acetaminophen (PERCOCET) 5-325 mg per tablet, 161096 12+OXYCODONE+CRT-UNBUND, cyclobenzaprine (FLEXERIL) 10 mg tablet   5. Bipolar I disorder, most recent episode (or current) depressed, unspecified  ALPRAZolam (XANAX) 1 mg tablet, traZODone (DESYREL) 300 mg tablet   6. Esophageal reflux     7. Contact with or exposure to other viral diseases  HIV 1/O/2 AB WITH CONFIRMATION   8. Encounter for long-term (current) use of other medications  [id number] 12+OXYCODONE+CRT-UNBUND       Pain contract signed and PMP reviewed  Last urine drug screen - today  Pt advised to see opthalmologist yearly.  Pt is back smoking and advised to quit and advised to continue ADA diet and regular exercise program.  Continue medications as discussed.   Reviewed plan of care, patient has provided input and agrees with goals  Patient expressed understanding with the diagnosis and plan.  Recheck in 1 month.

## 2011-11-06 NOTE — Telephone Encounter (Signed)
Patient called and would like to know her blood test results that were done earlier in the week.  Please call at [phone]

## 2011-11-06 NOTE — Telephone Encounter (Signed)
Returned pt's call advised we have not received the lab results back yet should have them back by the beginning of next week.

## 2011-11-10 LAB — HIV 1/O/2 AB WITH CONFIRMATION
HIV 1/O/2 Abs, QL: NONREACTIVE
HIV 1/O/2 Abs, Qual: NONREACTIVE
HIV 1/O/2 Abs-Index Value: <1 NA (ref <1.00)
HIV 1/O/2 Abs: <1 (ref <1.00)

## 2011-11-10 LAB — CBC W/O DIFF
HCT: 37.9 % (ref 34.0–46.6)
HGB: 11.7 g/dL (ref 11.1–15.9)
MCH: 28.5 pg (ref 26.6–33.0)
MCHC: 30.9 g/dL — ABNORMAL LOW (ref 31.5–35.7)
MCV: 92 fL (ref 79–97)
PLATELET: 207 10*3/uL (ref 140–415)
RBC: 4.1 x10E6/uL (ref 3.77–5.28)
RDW: 14.2 % (ref 12.3–15.4)
WBC: 7.4 10*3/uL (ref 4.0–10.5)

## 2011-11-10 LAB — METABOLIC PANEL, COMPREHENSIVE
A-G Ratio: 1.3 (ref 1.1–2.5)
ALT (SGPT): 29 IU/L (ref 0–32)
AST (SGOT): 25 IU/L (ref 0–40)
Albumin: 4.2 g/dL (ref 3.5–5.5)
Alk. phosphatase: 73 IU/L (ref 25–150)
BUN/Creatinine ratio: 13 (ref 9–23)
BUN: 11 mg/dL (ref 6–24)
Bilirubin, total: 0.2 mg/dL (ref 0.0–1.2)
CO2: 23 mmol/L (ref 20–32)
Calcium: 9.4 mg/dL (ref 8.7–10.2)
Chloride: 97 mmol/L (ref 97–108)
Creatinine: 0.86 mg/dL (ref 0.57–1.00)
GFR est non-AA: 80 mL/min/{1.73_m2} (ref >59)
GLOBULIN, TOTAL: 3.2 g/dL (ref 1.5–4.5)
Glucose: 247 mg/dL — ABNORMAL HIGH (ref 65–99)
Potassium: 4.2 mmol/L (ref 3.5–5.2)
Protein, total: 7.4 g/dL (ref 6.0–8.5)
Sodium: 135 mmol/L (ref 134–144)
eGFR If African American: 92 mL/min/{1.73_m2} (ref >59)

## 2011-11-10 LAB — 763824 12+OXYCODONE+CRT-UNBUND
Amphetamine Screen, urine: NEGATIVE ng/mL
Barbiturates Screen, urine: NEGATIVE ng/mL
Benzodiazepines Screen, urine: NEGATIVE ng/mL
Cannabinoid Screen, urine: NEGATIVE ng/mL
Cocaine Metab. Screen, urine: NEGATIVE ng/mL
Creatinine, urine: 103.2 mg/dL (ref 20.0–300.0)
Fentanyl Screen, urine: NEGATIVE ng/mL
Meperidine Screen, urine: NEGATIVE ng/mL
Methadone Screen, urine: NEGATIVE ng/mL
Opiate Screen, urine: NEGATIVE ng/mL
Oxycodone/Oxymorphone, urine: POSITIVE — AB
Phencyclidine Screen, urine: NEGATIVE ng/mL
Propoxyphene Screen, urine: NEGATIVE ng/mL
Specific Gravity: 1.019
Tramadol Screen, urine: NEGATIVE ng/mL
pH, urine: 6.5 (ref 4.5–8.9)

## 2011-11-10 LAB — LIPID PANEL
Cholesterol, total: 177 mg/dL (ref 100–199)
HDL Cholesterol: 24 mg/dL — ABNORMAL LOW (ref >39)
LDL, calculated: 83 mg/dL (ref 0–99)
Triglyceride: 352 mg/dL — ABNORMAL HIGH (ref 0–149)
VLDL, calculated: 70 mg/dL — ABNORMAL HIGH (ref 5–40)

## 2011-11-10 LAB — HEMOGLOBIN A1C WITH EAG: Hemoglobin A1c: 8 % — ABNORMAL HIGH (ref 4.8–5.6)

## 2011-11-10 NOTE — Telephone Encounter (Signed)
Dr Dodd, I see where the lab results are back but do not see your final result note. Please advise.

## 2011-11-10 NOTE — Telephone Encounter (Signed)
Patient would like call back with lab results [phone].

## 2011-11-10 NOTE — Progress Notes (Signed)
Quick Note:    Labs stable/normal except elevated blood sugar/cholesterol. Please follow 1800 calorie diet and increase fish oil to 3 tabs twice a day, increase Levemir by 10 units and recheck labs in 3 months.  ______

## 2011-11-10 NOTE — Telephone Encounter (Signed)
Patient called with results.  Told to increase Levemir by 10 units and limit carbohydrates and increase fish oil tabs to 3 tabs BID.

## 2011-11-13 NOTE — Progress Notes (Signed)
Quick Note:    Labs stable/normal except elevated blood sugar/cholesterol. Please follow 1800 calorie diet and increase fish oil to 3 tabs twice a day, increase Levemir by 10 units and recheck labs in 3 months.    Mailed pt copy  ______

## 2011-12-02 MED ORDER — INSULIN DETEMIR 100 UNIT/ML (3 ML) SUB-Q PEN
100 unit/mL (3 mL) | Freq: Two times a day (BID) | SUBCUTANEOUS | Status: DC
Start: 2011-12-02 — End: 2012-07-26

## 2011-12-02 MED ORDER — OXYCODONE-ACETAMINOPHEN 5 MG-325 MG TAB
5-325 mg | ORAL_TABLET | Freq: Two times a day (BID) | ORAL | Status: DC | PRN
Start: 2011-12-02 — End: 2012-01-01

## 2011-12-02 MED ORDER — MELOXICAM 15 MG TAB
15 mg | ORAL_TABLET | Freq: Every day | ORAL | Status: DC
Start: 2011-12-02 — End: 2012-04-29

## 2011-12-02 MED ORDER — CLONIDINE 0.2 MG TAB
0.2 mg | ORAL_TABLET | Freq: Two times a day (BID) | ORAL | Status: DC
Start: 2011-12-02 — End: 2012-11-25

## 2011-12-02 NOTE — Patient Instructions (Signed)
Type 2 Diabetes: After Your Visit  Your Care Instructions  Type 2 diabetes is a lifelong disease that develops when the pancreas cannot make enough insulin or when the body's tissues cannot use insulin properly. Insulin is a hormone that helps the body???s cells use sugar (glucose) for energy. It also helps the body store extra sugar in muscle, fat, and liver cells.  Without insulin, the sugar cannot get into the cells to do its work. It stays in the blood instead. This can cause high blood sugar levels. A person has diabetes when the blood sugar stays too high too much of the time. Over time, diabetes can lead to diseases of the heart, blood vessels, nerves, kidneys, and eyes.  You may be able to control your blood sugar by losing weight, eating a healthy diet, and getting daily exercise. You may also have to take pills or insulin shots (or both).  Follow-up care is a key part of your treatment and safety. Be sure to make and go to all appointments, and call your doctor if you are having problems. It???s also a good idea to know your test results and keep a list of the medicines you take.  How can you care for yourself at home?  ?? Keep your blood sugar at a target level (which you set with your doctor).   ?? Eat a good diet that spreads carbohydrate throughout the day. Carbohydrate???the body's main source of fuel???affects blood sugar more than any other nutrient. Carbohydrate is in fruits, vegetables, milk, and yogurt. It also is in breads, cereals, vegetables such as potatoes and corn, and sugary foods such as candy and cakes.   ?? Aim for 30 minutes of exercise on most, preferably all, days of the week. Walking is a good choice. You also may want to do other activities, such as running, swimming, cycling, or playing tennis or team sports.    ?? Take your medicines exactly as prescribed. Call your doctor if you think you are having a problem with your medicine. You will get more details on the specific medicines your doctor prescribes.   ?? Check your blood sugar as often as your doctor recommends. It is important to keep track of any symptoms you have, such as low blood sugar, and any changes in your activities, diet, or insulin use.   ?? Take a daily low-dose aspirin if your doctor suggests it. Aspirin may lower your risk of heart attack.   ?? Do not smoke. If you need help quitting, talk to your doctor about stop-smoking programs and medicines. These can increase your chances of quitting for good.   ?? Keep your cholesterol and blood pressure at normal levels. You may need to take one or more medicines to reach your goals. Take them exactly as directed. Do not stop or change a medicine without talking to your doctor first.   When should you call for help?  Call 911 anytime you think you may need emergency care. For example, call if:  ?? You passed out (lost consciousness), or you suddenly become very sleepy or confused. (You may have very low blood sugar.)   Call your doctor now or seek immediate medical care if:  ?? Your blood sugar is 300 mg/dL or is higher than the level your doctor has set for you.   ?? You have symptoms of low blood sugar, such as:   ?? Sweating.   ?? Feeling nervous, shaky, and weak.   ?? Extreme hunger and   slight nausea.   ?? Dizziness and headache.   ?? Blurred vision.   ?? Confusion.   Watch closely for changes in your health, and be sure to contact your doctor if:  ?? You often have problems controlling your blood sugar.   ?? You have symptoms of long-term diabetes problems, such as:   ?? New vision changes.   ?? New pain, numbness, or tingling in your hands or feet.   ?? Skin problems.     Where can you learn more?    Go to [email or web address]    Enter C553 in the search box to learn more about "Type 2 Diabetes: After Your Visit."    ?? 2006-2012 [employer]. Care instructions adapted under license by [employer] (which disclaims liability or warranty for this information). This care instruction is for use with your licensed healthcare professional. If you have questions about a medical condition or this instruction, always ask your healthcare professional. [employer] disclaims any warranty or liability for your use of this information.  Content Version: 9.4.94723; Last Revised: July 08, 2010          Low Back Pain: Exercises  Your Care Instructions  Here are some examples of typical rehabilitation exercises for your condition. Start each exercise slowly. Ease off the exercise if you start to have pain.  Your doctor or physical therapist will tell you when you can start these exercises and which ones will work best for you.  How to do the exercises  Press-up    1. Lie on your stomach, supporting your body with your forearms.   2. Press your elbows down into the floor to raise your upper back. As you do this, relax your stomach muscles and allow your back to arch without using your back muscles. As your press up, do not let your hips or pelvis come off the floor.   3. Hold for 15 to 30 seconds, then relax.   4. Repeat 2 to 4 times.   Alternate arm and leg (bird dog) exercise    Note: Do this exercise slowly. Try to keep your body straight at all times, and do not let one hip drop lower than the other.  1. Start on the floor, on your hands and knees.   2. Tighten your belly muscles.   3. Raise one leg off the floor, and hold it straight out behind you. Be careful not to let your hip drop down, because that will twist your trunk.   4. Hold for about 6 seconds, then lower your leg and switch to the other leg.   5. Repeat 8 to 12 times on each leg.   6. Over time, work up to holding for 10 to 30 seconds each time.    7. If you feel stable and secure with your leg raised, try raising the opposite arm straight out in front of you at the same time.   Knee-to-chest exercise    1. Lie on your back with your knees bent and your feet flat on the floor.   2. Bring one knee to your chest, keeping the other foot flat on the floor (or keeping the other leg straight, whichever feels better on your lower back).   3. Keep your lower back pressed to the floor. Hold for at least 15 to 30 seconds.   4. Relax, and lower the knee to the starting position.   5. Repeat with the other leg. Repeat 2 to 4 times with  each leg.   6. To get more stretch, put your other leg flat on the floor while pulling your knee to your chest.   Curl-ups    1. Lie on the floor on your back with your knees bent at a 90-degree angle. Your feet should be flat on the floor, about 12 inches from your buttocks.   2. Cross your arms over your chest.   3. Slowly tighten your belly muscles and raise your shoulder blades off the floor.   4. Keep your head in line with your body, and do not press your chin to your chest.   5. Hold this position for 1 or 2 seconds, then slowly lower yourself back down to the floor.   6. Repeat 8 to 12 times.   Pelvic tilt exercise    1. Lie on your back with your knees bent.   2. "Brace" your stomach. This means to tighten your muscles by pulling in and imagining your belly button moving toward your spine. You should feel like your back is pressing to the floor and your hips and pelvis are rocking back.   3. Hold for about 6 seconds while you breathe smoothly.   4. Repeat 8 to 12 times.   Heel dig bridging    1. Lie on your back with both knees bent and your ankles bent so that only your heels are digging into the floor. Your knees should be bent about 90 degrees.   2. Then push your heels into the floor, squeeze your buttocks, and lift your hips off the floor until your shoulders, hips, and knees are all in a straight line.    3. Hold for about 6 seconds as you continue to breathe normally, and then slowly lower your hips back down to the floor and rest for up to 10 seconds.   4. Do 8 to 12 repetitions.   Hamstring stretch in doorway    1. Lie on your back in a doorway, with one leg through the open door.   2. Slide your leg up the wall to straighten your knee. You should feel a gentle stretch down the back of your leg.   3. Hold the stretch for at least 15 to 30 seconds. Do not arch your back, point your toes, or bend either knee. Keep one heel touching the floor and the other heel touching the wall.   4. Repeat with your other leg.   5. Do 2 to 4 times for each leg.   Hip flexor stretch    1. Kneel on the floor with one knee bent and one leg behind you. Place your forward knee over your foot. Keep your other knee touching the floor.   2. Slowly push your hips forward until you feel a stretch in the upper thigh of your rear leg.   3. Hold the stretch for at least 15 to 30 seconds. Repeat with your other leg.   4. Do 2 to 4 times on each side.   Wall sit    1. Stand with your back 10 to 12 inches away from a wall.   2. Lean into the wall until your back is flat against it.   3. Slowly slide down until your knees are slightly bent, pressing your lower back into the wall.   4. Hold for about 6 seconds, then slide back up the wall.   5. Repeat 8 to 12 times.   Follow-up care is a key part  of your treatment and safety. Be sure to make and go to all appointments, and call your doctor if you are having problems. It's also a good idea to know your test results and keep a list of the medicines you take.    Where can you learn more?    Go to [email or web address]   Enter [phone] in the search box to learn more about "Low Back Pain: Exercises."     ?? 2006-2012 [employer]. Care instructions adapted under license by [employer] (which disclaims liability or warranty for this information). This care instruction is for use with your licensed healthcare professional. If you have questions about a medical condition or this instruction, always ask your healthcare professional. [employer] disclaims any warranty or liability for your use of this information.  Content Version: 9.4.94723; Last Revised: November 04, 2010

## 2011-12-06 NOTE — Progress Notes (Signed)
Subjective   Patient presents for follow up of chronic illnesses including Hypertension, Hypercholesterolemia and DM, Type II.  Patient checks BS regularly and they have been under better/good control.  Patient has no specific complaints today except low back pain.  Pain score 8/10 without treatment.  + increased stress due to son is in jail due to friend's assault on another person.  ROS:   History obtained from the patient   General: negative for - chills, fever or malaise   HEENT: no sores in mouth, vision problems or ear problems   Respiratory: no shortness of breath,  or wheezing   Cardiovascular: no chest pain or dyspnea on exertion or edema   Gastrointestinal: no abdominal pain, change in bowel habits, or black or bloody stools   Neurological: no paresthesias, weakness or dizzyness    Objective  PE:  The patient appears well, alert, oriented x 3, in no distress. Morbidly obese  BP 122/78   Pulse 104   Temp(Src) 97.6 ??F (36.4 ??C) (Oral)   Ht 5' 2.5" (1.588 m)   Wt 251 lb 3.2 oz (113.944 kg)   BMI 45.21 kg/m2   SpO2 97%  HEENT:  within normal limits  Neck: supple. No adenopathy, thyromegaly or carotid bruits.   Chest:  Lungs are clear, good air entry, no wheezes, rhonchi or rales.   CV:  Regular rate and rhythm, no murmurs, rubs or gallops.   Abdomen: soft without tenderness, guarding, mass or organomegaly.   Ext:  No clicks, clubbing or edema  Periph pulses NL. Normal Monofilament exam of feet  Back: + lower lumbar and para lumbar tenderness with limited ROM.   Neuro - +2/4 DTR's LE's, 5/5 strength and normal sensation to LT/PP LE's and negative SLR to 90 degrees.     Impression/Plan  1. Lumbago  oxyCODONE-acetaminophen (PERCOCET) 5-325 mg per tablet   2. Bipolar I disorder, most recent episode (or current) depressed, unspecified     3. Essential hypertension, benign  cloNIDine (CATAPRESS) 0.2 mg tablet   4. DM w/o Complication Type II      5. Type II or unspecified type diabetes mellitus without mention of complication, uncontrolled  insulin detemir (LEVEMIR FLEXPEN) 100 unit/mL (3 mL) pen   6. Morbid obesity  REFERRAL TO GENERAL SURGERY       Pain contract signed and PMP reviewed    Pt is smoking and advised to quit and advised to continue ADA diet and regular exercise program.  Continue medications as discussed.   Reviewed plan of care, patient has provided input and agrees with goals  Patient expressed understanding with the diagnosis and plan.  Recheck in 1 month.

## 2011-12-07 ENCOUNTER — Encounter

## 2011-12-07 MED ORDER — AMOXICILLIN 875 MG TAB
875 mg | ORAL_TABLET | Freq: Two times a day (BID) | ORAL | Status: AC
Start: 2011-12-07 — End: 2011-12-17

## 2011-12-07 NOTE — Telephone Encounter (Signed)
Patient is calling and stating that she has a sinus infection and it is all in her head and needs an antibiotic called in.  She does not have a car and does not have way to get to the office.  Please call something in if you will.  Call her at [phone] if there is any problems.

## 2011-12-07 NOTE — Telephone Encounter (Signed)
Amoxil was called to pharmacy.  Pt has tried OTC decongestants and saline.

## 2012-01-01 MED ORDER — FLUTICASONE 50 MCG/ACTUATION NASAL SPRAY, SUSP
50 mcg/actuation | Freq: Every day | NASAL | Status: DC
Start: 2012-01-01 — End: 2013-07-31

## 2012-01-01 MED ORDER — CEFDINIR 300 MG CAP
300 mg | ORAL_CAPSULE | Freq: Two times a day (BID) | ORAL | Status: AC
Start: 2012-01-01 — End: 2012-01-11

## 2012-01-01 MED ORDER — PSEUDOEPHEDRINE-GUAIFENESIN SR 60 MG-600 MG 12 HR TAB
60-600 mg | ORAL_TABLET | Freq: Two times a day (BID) | ORAL | Status: DC
Start: 2012-01-01 — End: 2012-10-25

## 2012-01-01 MED ORDER — OXYCODONE-ACETAMINOPHEN 5 MG-325 MG TAB
5-325 mg | ORAL_TABLET | Freq: Two times a day (BID) | ORAL | Status: DC | PRN
Start: 2012-01-01 — End: 2012-02-02

## 2012-01-03 NOTE — Progress Notes (Signed)
Subjective   Patient presents for follow up of chronic illnesses including Hypertension, Hypercholesterolemia and DM, Type II.  Patient checks BS regularly and they have been under better/good control.  Patient has no specific complaints today except low back pain and sinus congestion, mild sore throat and cough for 2 weeks.  Pain score 8/10 without treatment. Pt also would like treatment to help quit smoking.  ROS:   History obtained from the patient   General: negative for - chills, fever or malaise   HEENT: no sores in mouth, vision problems or ear problems   Respiratory: no shortness of breath,  or wheezing   Cardiovascular: no chest pain or dyspnea on exertion or edema   Gastrointestinal: no abdominal pain, change in bowel habits, or black or bloody stools   Neurological: no paresthesias, weakness or dizzyness    Objective  PE:  The patient appears well, alert, oriented x 3, in no distress. Morbidly obese  BP 130/90   Pulse 114   Temp(Src) 97.5 ??F (36.4 ??C) (Oral)   Ht 5' 2.5" (1.588 m)   Wt 254 lb (115.214 kg)   BMI 45.72 kg/m2   SpO2 98%  HEENT:  within normal limits except sinus congestion with yellow discharge  Neck: supple. No adenopathy, thyromegaly or carotid bruits.   Chest:  Lungs - few scattered rhonchi bilaterally.   CV:  Regular rate and rhythm, no murmurs, rubs or gallops.   Abdomen: soft without tenderness, guarding, mass or organomegaly.   Ext:  No clicks, clubbing or edema  Periph pulses NL. Normal Monofilament exam of feet  Back: + lower lumbar and para lumbar tenderness with limited ROM.   Neuro - +2/4 DTR's LE's, 5/5 strength and normal sensation to LT/PP LE's and negative SLR to 90 degrees.     Impression/Plan  1. Essential hypertension, benign     2. DM w/o Complication Type II     3. Allergic rhinitis due to other allergen  fluticasone (FLONASE) 50 mcg/actuation nasal spray   4. Mixed hyperlipidemia     5. Lumbago  oxyCODONE-acetaminophen (PERCOCET) 5-325 mg per tablet   6. Acute  maxillary sinusitis  pseudoephedrine-guaiFENesin (MUCINEX D) 60-600 mg per tablet, cefdinir (OMNICEF) 300 mg capsule   7. Acute bronchitis  pseudoephedrine-guaiFENesin (MUCINEX D) 60-600 mg per tablet, cefdinir (OMNICEF) 300 mg capsule   8. Tobacco use disorder  varenicline (CHANTIX CONTINUING MONTH BOX) 1 mg tablet       Pain contract signed and PMP reviewed    Pt is smoking and advised to quit and advised to continue ADA diet and regular exercise program.  Continue medications as discussed.   Reviewed plan of care, patient has provided input and agrees with goals  Patient expressed understanding with the diagnosis and plan.  Recheck in 1 month.

## 2012-01-04 MED ORDER — VARENICLINE 1 MG TAB
1 mg | PACK | Freq: Two times a day (BID) | ORAL | Status: DC
Start: 2012-01-04 — End: 2012-03-31

## 2012-02-02 MED ORDER — OXYCODONE-ACETAMINOPHEN 5 MG-325 MG TAB
5-325 mg | ORAL_TABLET | Freq: Two times a day (BID) | ORAL | Status: DC | PRN
Start: 2012-02-02 — End: 2012-03-01

## 2012-02-02 MED ORDER — AMOXICILLIN 875 MG TAB
875 mg | ORAL_TABLET | Freq: Two times a day (BID) | ORAL | Status: AC
Start: 2012-02-02 — End: 2012-02-12

## 2012-02-02 NOTE — Progress Notes (Signed)
Patient presents for a follow up for med refill. Patient also complains of tooth pain on left lower jaw that she rates a 7. Reviewed record in preparation for visit and have obtained necessary documentation.

## 2012-02-03 NOTE — Progress Notes (Signed)
Subjective   Patient presents for follow up of chronic illnesses including Hypertension, Hypercholesterolemia and DM, Type II.  Patient checks BS regularly and they have been under better/good control.  Patient has no specific complaints today except low back pain and tooth pain   Pain score 8/10 without treatment. Pt is cutting back on tobacco use.  ROS:   History obtained from the patient   General: negative for - chills, fever or malaise   HEENT: no sores in mouth, vision problems or ear problems   Respiratory: no shortness of breath,  or wheezing   Cardiovascular: no chest pain or dyspnea on exertion or edema   Gastrointestinal: no abdominal pain, change in bowel habits, or black or bloody stools   Neurological: no paresthesias, weakness or dizzyness    Objective  PE:  The patient appears well, alert, oriented x 3, in no distress. Morbidly obese  BP 121/63   Pulse 90   Temp(Src) 97.1 ??F (36.2 ??C) (Oral)   Resp 14   Ht 5' 2.5" (1.588 m)   Wt 253 lb 9.6 oz (115.032 kg)   BMI 45.64 kg/m2  HEENT:  within normal limits except tender lower premolar on left with mild gingival swelling and erythema.  Neck: supple. No adenopathy, thyromegaly or carotid bruits.   Chest:  Lungs - few scattered rhonchi bilaterally.   CV:  Regular rate and rhythm, no murmurs, rubs or gallops.   Abdomen: soft without tenderness, guarding, mass or organomegaly.   Ext:  No clicks, clubbing or edema  Periph pulses NL. Normal Monofilament exam of feet  Back: + lower lumbar and para lumbar tenderness with limited ROM.   Neuro - +2/4 DTR's LE's, 5/5 strength and normal sensation to LT/PP LE's and negative SLR to 90 degrees.     Impression/Plan  1. Lumbago  oxyCODONE-acetaminophen (PERCOCET) 5-325 mg per tablet   2. Bipolar I disorder, most recent episode (or current) depressed, unspecified     3. Tooth infection  amoxicillin (AMOXIL) 875 mg tablet   4. DM w/o Complication Type II     5. Essential hypertension, benign         Pain contract  signed and PMP reviewed    Pt advised to quit smoking and advised to continue ADA diet and regular exercise program.  Continue medications as discussed.   Reviewed plan of care, patient has provided input and agrees with goals  Patient expressed understanding with the diagnosis and plan.  Recheck in 1 month.

## 2012-02-11 ENCOUNTER — Encounter

## 2012-02-11 MED ORDER — CYCLOBENZAPRINE 10 MG TAB
10 mg | ORAL_TABLET | Freq: Three times a day (TID) | ORAL | Status: DC | PRN
Start: 2012-02-11 — End: 2012-03-01

## 2012-02-11 NOTE — Telephone Encounter (Signed)
Patient's last OV 02/02/2012

## 2012-03-01 MED ORDER — METFORMIN ER 500 MG 24 HR TABLET,EXTENDED RELEASE
500 mg | ORAL_TABLET | Freq: Every day | ORAL | Status: DC
Start: 2012-03-01 — End: 2012-10-14

## 2012-03-01 MED ORDER — OXYCODONE-ACETAMINOPHEN 5 MG-325 MG TAB
5-325 mg | ORAL_TABLET | Freq: Two times a day (BID) | ORAL | Status: DC | PRN
Start: 2012-03-01 — End: 2012-03-31

## 2012-03-01 MED ORDER — TIZANIDINE 4 MG TAB
4 mg | ORAL_TABLET | Freq: Three times a day (TID) | ORAL | Status: DC | PRN
Start: 2012-03-01 — End: 2012-10-25

## 2012-03-01 MED ORDER — ALPRAZOLAM 1 MG TAB
1 mg | ORAL_TABLET | Freq: Two times a day (BID) | ORAL | Status: DC
Start: 2012-03-01 — End: 2012-03-31

## 2012-03-01 NOTE — Progress Notes (Signed)
Patient presents for med refill and fasting labs. Reviewed record in preparation for visit and have obtained necessary documentation.

## 2012-03-02 LAB — MICROALBUMIN, RANDOM URINE

## 2012-03-02 LAB — CBC W/O DIFF
HCT: 41.9 % (ref 34.0–46.6)
HGB: 13 g/dL (ref 11.1–15.9)
MCH: 28.4 pg (ref 26.6–33.0)
MCHC: 31 g/dL — ABNORMAL LOW (ref 31.5–35.7)
MCV: 92 fL (ref 79–97)
PLATELET: 211 10*3/uL (ref 140–415)
RBC: 4.57 x10E6/uL (ref 3.77–5.28)
RDW: 14.1 % (ref 12.3–15.4)
WBC: 7.3 10*3/uL (ref 4.0–10.5)

## 2012-03-02 LAB — URINALYSIS W/ RFLX MICROSCOPIC

## 2012-03-02 LAB — TSH 3RD GENERATION: TSH: 1.5 u[IU]/mL (ref 0.450–4.500)

## 2012-03-02 LAB — METABOLIC PANEL, COMPREHENSIVE
A-G Ratio: 1.5 (ref 1.1–2.5)
ALT (SGPT): 36 IU/L — ABNORMAL HIGH (ref 0–32)
AST (SGOT): 26 IU/L (ref 0–40)
Albumin: 4.5 g/dL (ref 3.5–5.5)
Alk. phosphatase: 85 IU/L (ref 25–150)
BUN/Creatinine ratio: 22 (ref 9–23)
BUN: 17 mg/dL (ref 6–24)
Bilirubin, total: 0.2 mg/dL (ref 0.0–1.2)
CO2: 20 mmol/L (ref 20–32)
Calcium: 8.9 mg/dL (ref 8.7–10.2)
Chloride: 91 mmol/L — ABNORMAL LOW (ref 97–108)
Creatinine: 0.76 mg/dL (ref 0.57–1.00)
GFR est non-AA: 92 mL/min/{1.73_m2} (ref >59)
GLOBULIN, TOTAL: 3.1 g/dL (ref 1.5–4.5)
Glucose: 332 mg/dL — ABNORMAL HIGH (ref 65–99)
Potassium: 4.2 mmol/L (ref 3.5–5.2)
Protein, total: 7.6 g/dL (ref 6.0–8.5)
Sodium: 133 mmol/L — ABNORMAL LOW (ref 134–144)
eGFR If African American: 107 mL/min/{1.73_m2} (ref >59)

## 2012-03-02 LAB — LIPID PANEL
Cholesterol, total: 233 mg/dL — ABNORMAL HIGH (ref 100–199)
HDL Cholesterol: 22 mg/dL — ABNORMAL LOW (ref >39)
Triglyceride: 821 mg/dL — CR (ref 0–149)

## 2012-03-02 LAB — VITAMIN D, 25 HYDROXY: VITAMIN D, 25-HYDROXY: 12.4 ng/mL — ABNORMAL LOW (ref 30.0–100.0)

## 2012-03-02 LAB — HEMOGLOBIN A1C WITH EAG: Hemoglobin A1c: 10 % — ABNORMAL HIGH (ref 4.8–5.6)

## 2012-03-02 LAB — REQUEST PROBLEM

## 2012-03-02 LAB — CK: Creatine Kinase,Total: 132 U/L (ref 24–173)

## 2012-03-02 NOTE — Progress Notes (Signed)
Quick Note:    Patient has very high BS, TG and low Vitamin D. Please have patient follow up to discuss.  ______

## 2012-03-05 NOTE — Progress Notes (Signed)
Subjective   Patient presents for follow up of chronic illnesses including Hypertension, Hypercholesterolemia and DM, Type II.  Patient checks BS regularly and they have been under better/good control.  Patient has no specific complaints today except low back pain/spasms and was seen in ER 2/23 and given Flexeril without relief.   Pain score 8/10 without treatment.   ROS:   History obtained from the patient   General: negative for - chills, fever or malaise +7 lb wt loss with diet and exercise.  HEENT: no sores in mouth, vision problems or ear problems   Respiratory: no shortness of breath,  or wheezing   Cardiovascular: no chest pain or dyspnea on exertion or edema   Gastrointestinal: no abdominal pain, change in bowel habits, or black or bloody stools   Neurological: no paresthesias, weakness or dizzyness    Objective  PE:  The patient appears well, alert, oriented x 3, in no distress. Morbidly obese  BP 121/84   Pulse 101   Temp(Src) 97.3 ??F (36.3 ??C) (Oral)   Resp 14   Ht 5' 2.5" (1.588 m)   Wt 246 lb 12.8 oz (111.948 kg)   BMI 44.42 kg/m2   SpO2 98%  HEENT:  within normal limits except tender lower premolar on left with mild gingival swelling and erythema.  Neck: supple. No adenopathy, thyromegaly or carotid bruits.   Chest:  Lungs - few scattered rhonchi bilaterally.   CV:  Regular rate and rhythm, no murmurs, rubs or gallops.   Abdomen: soft without tenderness, guarding, mass or organomegaly.   Ext:  No clicks, clubbing or edema  Periph pulses NL. Normal Monofilament exam of feet  Back: + lower lumbar and para lumbar tenderness and muscle spasms with limited ROM.   Neuro - +2/4 DTR's LE's, 5/5 strength and normal sensation to LT/PP LE's and negative SLR to 90 degrees.     Impression/Plan  1. Lumbago  oxyCODONE-acetaminophen (PERCOCET) 5-325 mg per tablet, tiZANidine (ZANAFLEX) 4 mg tablet   2. Bipolar I disorder, most recent episode (or current) depressed, unspecified  ALPRAZolam (XANAX) 1 mg tablet   3.  DM w/o Complication Type II  HEMOGLOBIN A1C, MICROALBUMIN, RANDOM URINE, URINALYSIS W/ RFLX MICROSCOPIC, metFORMIN ER (GLUMETZA ER) tablet   4. Mixed hyperlipidemia  CBC W/O DIFF, LIPID PANEL, METABOLIC PANEL, COMPREHENSIVE, TSH, 3RD GENERATION, CK, URINALYSIS W/ RFLX MICROSCOPIC   5. Microalbuminuria     6. Unspecified vitamin D deficiency  VITAMIN D, 25 HYDROXY       Pain contract signed and PMP reviewed    Pt advised to quit smoking and advised to continue ADA diet and regular exercise program.  Continue medications as discussed.   Reviewed plan of care, patient has provided input and agrees with goals  Patient expressed understanding with the diagnosis and plan.  Recheck in 1 month.

## 2012-03-07 NOTE — Telephone Encounter (Signed)
Called and discussed labs with patient and requested she make an appointment to come in to discuss lab results. Patient states she has an appointment for 04/01/12 and she will not come in before that. Informed her that the medication needs prior authorization  And no time limit to when we will receive an answer. Patient ask if this medication had metformin in it and I explained yes. Patient states she wants something else due to metformin gives her diarrhea.

## 2012-03-07 NOTE — Telephone Encounter (Signed)
Patient called want to review lab results. She also need prior authorization on med you prescribe on the 03/01/12. Thanks.

## 2012-03-07 NOTE — Telephone Encounter (Signed)
Will discuss medications when she comes in for office visit.

## 2012-03-10 NOTE — Progress Notes (Signed)
Received notification from [facility] approval from 03/01/2012 to 03/09/2013 for Rx [facility] ER.

## 2012-03-31 MED ORDER — ERGOCALCIFEROL (VITAMIN D2) 50,000 UNIT CAP
1250 mcg (50,000 unit) | ORAL_CAPSULE | ORAL | Status: DC
Start: 2012-03-31 — End: 2013-04-26

## 2012-03-31 MED ORDER — OXYCODONE-ACETAMINOPHEN 5 MG-325 MG TAB
5-325 mg | ORAL_TABLET | Freq: Two times a day (BID) | ORAL | Status: DC | PRN
Start: 2012-03-31 — End: 2012-04-29

## 2012-03-31 MED ORDER — ALPRAZOLAM 1 MG TAB
1 mg | ORAL_TABLET | Freq: Two times a day (BID) | ORAL | Status: DC
Start: 2012-03-31 — End: 2012-06-24

## 2012-03-31 MED ORDER — VARENICLINE 1 MG TAB
1 mg | PACK | Freq: Two times a day (BID) | ORAL | Status: DC
Start: 2012-03-31 — End: 2012-12-26

## 2012-03-31 MED ORDER — TRAZODONE 300 MG TAB
300 mg | ORAL_TABLET | Freq: Every evening | ORAL | Status: DC
Start: 2012-03-31 — End: 2012-07-26

## 2012-03-31 NOTE — Progress Notes (Signed)
Patient presents for follow up with med refill. Reviewed record in preparation for visit and have obtained necessary documentation.

## 2012-04-03 NOTE — Progress Notes (Signed)
Subjective   Patient presents for follow up of chronic illnesses including Hypertension, Hypercholesterolemia and DM, Type II.  Patient checks BS regularly and they have been elevated at times.  Patient has no specific complaints today except low back pain/spasms.  Pain score 8/10 without treatment.   ROS:   History obtained from the patient   General: negative for - chills, fever or malaise +7 lb wt loss with diet and exercise.  HEENT: no sores in mouth, vision problems or ear problems   Respiratory: no shortness of breath,  or wheezing   Cardiovascular: no chest pain or dyspnea on exertion or edema   Gastrointestinal: no abdominal pain, change in bowel habits, or black or bloody stools   Neurological: no paresthesias, weakness or dizzyness    Objective  PE:  The patient appears well, alert, oriented x 3, in no distress. Morbidly obese  BP 122/68   Pulse 91   Temp(Src) 97.9 ??F (36.6 ??C) (Oral)   Resp 10   Ht 5' 2.5" (1.588 m)   Wt 240 lb (108.863 kg)   BMI 43.20 kg/m2   SpO2 99%  HEENT:  within normal limits   Neck: supple. No adenopathy, thyromegaly or carotid bruits.   Chest:  Lungs - few scattered rhonchi bilaterally.   CV:  Regular rate and rhythm, no murmurs, rubs or gallops.   Abdomen: soft without tenderness, guarding, mass or organomegaly.   Ext:  No clicks, clubbing or edema  Periph pulses NL. Normal Monofilament exam of feet  Back: + lower lumbar and para lumbar tenderness and muscle spasms with limited ROM.   Neuro - +2/4 DTR's LE's, 5/5 strength and normal sensation to LT/PP LE's and negative SLR to 90 degrees.     Impression/Plan  1. Lumbago  oxyCODONE-acetaminophen (PERCOCET) 5-325 mg per tablet   2. Bipolar I disorder, most recent episode (or current) depressed, unspecified  ALPRAZolam (XANAX) 1 mg tablet, traZODone (DESYREL) 300 mg tablet   3. Essential hypertension, benign     4. Mixed hyperlipidemia     5. Type II or unspecified type diabetes mellitus without mention of complication,  uncontrolled     6. Unspecified vitamin D deficiency  ergocalciferol (VITAMIN D2) 50,000 unit capsule   7. Tobacco use disorder  varenicline (CHANTIX CONTINUING MONTH BOX) 1 mg tablet       Pain contract signed and PMP reviewed    Pt advised to quit smoking and restarted Chantix and advised to continue ADA diet and regular exercise program.  Continue medications as discussed -increase insulin 3 units every 3 days until fasting BS is less than 130 .   Reviewed plan of care, patient has provided input and agrees with goals  Patient expressed understanding with the diagnosis and plan.  Recheck in 1 month.

## 2012-04-29 MED ORDER — NAPROXEN 500 MG TAB
500 mg | ORAL_TABLET | Freq: Two times a day (BID) | ORAL | Status: DC
Start: 2012-04-29 — End: 2012-07-26

## 2012-04-29 MED ORDER — OXYCODONE-ACETAMINOPHEN 5 MG-325 MG TAB
5-325 mg | ORAL_TABLET | Freq: Two times a day (BID) | ORAL | Status: DC | PRN
Start: 2012-04-29 — End: 2012-05-27

## 2012-04-29 MED ORDER — NICOTINE 10 MG INHALATION CARTRIDGE
10 mg | RESPIRATORY_TRACT | Status: AC | PRN
Start: 2012-04-29 — End: 2012-05-29

## 2012-04-29 NOTE — Progress Notes (Signed)
Subjective   Patient presents for follow up of chronic illnesses including Hypertension, Hypercholesterolemia and DM, Type II.  Patient checks BS regularly and they have been under good control.  Patient has no specific complaints today except low back pain/spasms.  Pain score 8/10 without treatment. Pt also would like to try nicotrol inhaler to help quit smoking. Pt also has gum pain from 5 tooth extractions yesterday.  ROS:   History obtained from the patient   General: negative for - chills, fever or malaise +2 lb wt loss with diet and exercise.  HEENT: no sores in mouth, vision problems or ear problems   Respiratory: no shortness of breath,  or wheezing   Cardiovascular: no chest pain or dyspnea on exertion or edema   Gastrointestinal: no abdominal pain, change in bowel habits, or black or bloody stools   Neurological: no paresthesias, weakness or dizzyness    Objective  PE:  The patient appears well, alert, oriented x 3, in no distress. Morbidly obese  BP 126/83   Pulse 95   Temp(Src) 98 ??F (36.7 ??C) (Oral)   Resp 10   Ht 5' 2.5" (1.588 m)   Wt 238 lb 9.6 oz (108.228 kg)   BMI 42.94 kg/m2   SpO2 98%  HEENT:  within normal limits except mild gum inflammation from tooth extractions.  Neck: supple. No adenopathy, thyromegaly or carotid bruits.   Chest:  Lungs - few scattered rhonchi bilaterally.   CV:  Regular rate and rhythm, no murmurs, rubs or gallops.   Abdomen: soft without tenderness, guarding, mass or organomegaly.   Ext:  No clicks, clubbing or edema  Periph pulses NL. Normal Monofilament exam of feet  Back: + lower lumbar and para lumbar tenderness and muscle spasms with limited ROM.   Neuro - +2/4 DTR's LE's, 5/5 strength and normal sensation to LT/PP LE's and negative SLR to 90 degrees.     Impression/Plan  1. Lumbago  oxyCODONE-acetaminophen (PERCOCET) 5-325 mg per tablet, naproxen (NAPROSYN) 500 mg tablet   2. Tobacco use disorder  nicotine (NICOTROL) 10 mg inhaler   3. Essential hypertension,  benign     4. DM w/o Complication Type II     5. Morbid obesity         Pain contract signed and PMP reviewed    Pt advised to quit smoking and restarted Chantix and advised to continue ADA diet and regular exercise program.  Continue medications as discussed  Reviewed plan of care, patient has provided input and agrees with goals  Patient expressed understanding with the diagnosis and plan.  Recheck in 1 month.

## 2012-04-29 NOTE — Progress Notes (Signed)
Patient presents for follow up and med refill. Patient requesting to be started on the nicotrol inhaler due to chantix not working for her. Patient had 5 teeth removed yesterday and rates mouth pain a 7.  Reviewed record in preparation for visit and have obtained necessary documentation.

## 2012-05-27 LAB — AMB POC URINALYSIS DIP STICK AUTO W/ MICRO
Bilirubin (UA POC): NEGATIVE
Glucose (UA POC): NEGATIVE
Ketones (UA POC): NEGATIVE
Nitrites (UA POC): POSITIVE
Specific gravity (UA POC): 1.015 (ref 1.001–1.035)
Urobilinogen (UA POC): 0.2 (ref 0.2–1)
pH (UA POC): 5.5 (ref 4.6–8.0)

## 2012-05-27 MED ORDER — CLOBETASOL 0.05 % TOPICAL CREAM
0.05 % | Freq: Two times a day (BID) | CUTANEOUS | Status: DC
Start: 2012-05-27 — End: 2012-10-25

## 2012-05-27 MED ORDER — NITROFURANTOIN (25% MACROCRYSTAL FORM) 100 MG CAP
100 mg | ORAL_CAPSULE | Freq: Two times a day (BID) | ORAL | Status: DC
Start: 2012-05-27 — End: 2012-10-25

## 2012-05-27 MED ORDER — OXYCODONE-ACETAMINOPHEN 5 MG-325 MG TAB
5-325 mg | ORAL_TABLET | Freq: Two times a day (BID) | ORAL | Status: DC | PRN
Start: 2012-05-27 — End: 2012-06-24

## 2012-05-27 NOTE — Progress Notes (Signed)
Patient presents for follow up of UTI and for med refill. Reviewed record in preparation for visit and have obtained necessary documentation.

## 2012-06-01 NOTE — Progress Notes (Signed)
Subjective   Patient presents for follow up of chronic illnesses including Hypertension, Hypercholesterolemia and DM, Type II.  Patient checks BS regularly and they have been under better control.  Patient is also here for follow up of UTI/Nausea treated in [place] ER 4 days ago with Keflex, Zofran, Ultram and Pyridium and is not resolving.  Pt also has insect bites on ankles which are itchy.  ROS:   History obtained from the patient   General: negative for - chills, fever or malaise +2 lb wt loss with diet and exercise.  HEENT: no sores in mouth, vision problems or ear problems   Respiratory: no shortness of breath,  or wheezing   Cardiovascular: no chest pain or dyspnea on exertion or edema   Gastrointestinal: no abdominal pain, change in bowel habits, or black or bloody stools   Neurological: no paresthesias, weakness or dizzyness    Objective  PE:  The patient appears well, alert, oriented x 3, in no distress. Morbidly obese BF  BP 136/79   Pulse 95   Temp(Src) 97.3 ??F (36.3 ??C) (Oral)   Resp 12   Ht 5' 2.5" (1.588 m)   Wt 236 lb (107.049 kg)   BMI 42.48 kg/m2   SpO2 100%  HEENT:  within normal limits except mild gum inflammation from tooth extractions.  Neck: supple. No adenopathy, thyromegaly or carotid bruits.   Chest:  Lungs - few scattered rhonchi bilaterally.   CV:  Regular rate and rhythm, no murmurs, rubs or gallops.   Abdomen: soft without tenderness, guarding, mass or organomegaly.   Ext:  No clicks, clubbing or edema  Periph pulses NL. Normal Monofilament exam of feet  Back: + lower lumbar and para lumbar tenderness with limited ROM. No CVA tenderness  Neuro - +2/4 DTR's LE's, 5/5 strength and normal sensation to LT/PP LE's and negative SLR to 90 degrees.   Skin: few erythematous papules of lower legs.    Impression/Plan  1. DM w/o Complication Type II     2. Lumbago  oxyCODONE-acetaminophen (PERCOCET) 5-325 mg per tablet   3. Bipolar I disorder, most recent episode (or current) depressed,  unspecified     4. UTI (lower urinary tract infection)  AMB POC URINALYSIS DIP STICK AUTO W/ MICRO, nitrofurantoin, macrocrystal-monohydrate, (MACROBID) 100 mg capsule   5. Urinary tract infection, site not specified     6. Insect bites  clobetasol (TEMOVATE) 0.05 % topical cream   7. Contact dermatitis and other eczema, due to unspecified cause  clobetasol (TEMOVATE) 0.05 % topical cream   8. Unspecified vitamin D deficiency     9. Essential hypertension, benign     10. Morbid obesity     11. Tobacco use disorder     12. Mixed hyperlipidemia         Pain contract signed and PMP reviewed    Pt advised to quit smoking and advised to continue ADA diet and regular exercise program.  Continue medications as discussed  Reviewed plan of care, patient has provided input and agrees with goals  Patient expressed understanding with the diagnosis and plan.  Recheck in 1 month/prn.

## 2012-06-24 MED ORDER — ALPRAZOLAM 1 MG TAB
1 mg | ORAL_TABLET | Freq: Two times a day (BID) | ORAL | Status: DC
Start: 2012-06-24 — End: 2012-10-25

## 2012-06-24 MED ORDER — ESTRADIOL 0.1 MG/24 HR SEMIWEEKLY TRANSDERM PATCH
0.1 mg/24 hr | MEDICATED_PATCH | TRANSDERMAL | Status: DC
Start: 2012-06-24 — End: 2012-10-25

## 2012-06-24 MED ORDER — QUETIAPINE 25 MG TAB
25 mg | ORAL_TABLET | Freq: Every evening | ORAL | Status: DC
Start: 2012-06-24 — End: 2012-10-25

## 2012-06-24 MED ORDER — OXYCODONE-ACETAMINOPHEN 5 MG-325 MG TAB
5-325 mg | ORAL_TABLET | Freq: Three times a day (TID) | ORAL | Status: DC | PRN
Start: 2012-06-24 — End: 2012-07-26

## 2012-06-24 NOTE — Progress Notes (Signed)
Patient presents for follow up with labs to be ordered and med refill. Reviewed record in preparation for visit and have obtained necessary documentation.

## 2012-06-25 NOTE — Progress Notes (Signed)
Subjective   Patient presents for follow up of chronic illnesses including Hypertension, Hypercholesterolemia and DM, Type II.  Patient checks BS regularly and they have been under better control.  Patient complains of insomnia for 2 weeks with anxiety and depression.  Pt has also has had cough for 4 days.  ROS:   History obtained from the patient   General: negative for - chills, fever or malaise + 3 lb wt gain since last visit.  HEENT: no sores in mouth, vision problems or ear problems   Respiratory: no shortness of breath,  or wheezing   Cardiovascular: no chest pain or dyspnea on exertion or edema   Gastrointestinal: no abdominal pain, change in bowel habits, or black or bloody stools   Neurological: no paresthesias, weakness or dizzyness    Objective  PE:  The patient appears well, alert, oriented x 3, in no distress. Morbidly obese BF  BP 122/78   Pulse 88   Temp(Src) 96.7 ??F (35.9 ??C) (Oral)   Resp 12   Ht 5' 2.5" (1.588 m)   Wt 239 lb 6.4 oz (108.591 kg)   BMI 43.09 kg/m2   SpO2 98%  HEENT:  within normal limits except mild nasal congestion  Neck: supple. No adenopathy, thyromegaly or carotid bruits.   Chest:  Lungs - few scattered rhonchi bilaterally.   CV:  Regular rate and rhythm, no murmurs, rubs or gallops.   Abdomen: soft without tenderness, guarding, mass or organomegaly.   Ext:  No clicks, clubbing or edema  Periph pulses NL. Normal Monofilament exam of feet  Back: + lower lumbar and para lumbar tenderness with limited ROM. No CVA tenderness  Neuro - +2/4 DTR's LE's, 5/5 strength and normal sensation to LT/PP LE's and negative SLR to 90 degrees.     Impression/Plan  1. Lumbago  oxyCODONE-acetaminophen (PERCOCET) 5-325 mg per tablet, PAIN MGMT PANEL W/ REFX, UR   2. Bipolar I disorder, most recent episode (or current) depressed, unspecified  ALPRAZolam (XANAX) 1 mg tablet, PAIN MGMT PANEL W/ REFX, UR   3. Mixed hyperlipidemia  CBC W/O DIFF, LIPID PANEL, METABOLIC PANEL, COMPREHENSIVE   4. DM w/o  Complication Type II  HEMOGLOBIN A1C, MICROALBUMIN, RANDOM URINE   5. Essential hypertension, benign  CBC W/O DIFF, METABOLIC PANEL, COMPREHENSIVE, URINALYSIS W/ RFLX MICROSCOPIC   6. Unspecified vitamin D deficiency  VITAMIN D, 25 HYDROXY   7. Insomnia, unspecified  QUEtiapine (SEROQUEL) 25 mg tablet   8. Symptomatic menopausal or female climacteric states  estradiol (ESTRADERM) 0.1 mg/24 hr   9. Acute bronchitis         Pain contract signed and PMP reviewed    Pt advised to quit smoking and advised to continue ADA diet and regular exercise program.  Continue medications as discussed.  Mucinex DM prn.  Reviewed plan of care, patient has provided input and agrees with goals  Patient expressed understanding with the diagnosis and plan.  Recheck in 1 month/prn.

## 2012-07-01 LAB — METABOLIC PANEL, COMPREHENSIVE
A-G Ratio: 1.3 (ref 1.1–2.5)
ALT (SGPT): 26 IU/L (ref 0–32)
AST (SGOT): 26 IU/L (ref 0–40)
Albumin: 4.2 g/dL (ref 3.5–5.5)
Alk. phosphatase: 83 IU/L (ref 25–150)
BUN/Creatinine ratio: 30 — ABNORMAL HIGH (ref 9–23)
BUN: 20 mg/dL (ref 6–24)
Bilirubin, total: 0.2 mg/dL (ref 0.0–1.2)
CO2: 24 mmol/L (ref 19–28)
Calcium: 9.5 mg/dL (ref 8.7–10.2)
Chloride: 100 mmol/L (ref 97–108)
Creatinine: 0.67 mg/dL (ref 0.57–1.00)
GFR est non-AA: 104 mL/min/{1.73_m2} (ref >59)
GLOBULIN, TOTAL: 3.3 g/dL (ref 1.5–4.5)
Glucose: 142 mg/dL — ABNORMAL HIGH (ref 65–99)
Potassium: 4.1 mmol/L (ref 3.5–5.2)
Protein, total: 7.5 g/dL (ref 6.0–8.5)
Sodium: 139 mmol/L (ref 134–144)
eGFR If African American: 119 mL/min/{1.73_m2} (ref >59)

## 2012-07-01 LAB — LIPID PANEL
Cholesterol, total: 204 mg/dL — ABNORMAL HIGH (ref 100–199)
HDL Cholesterol: 24 mg/dL — ABNORMAL LOW (ref >39)
Triglyceride: 436 mg/dL — ABNORMAL HIGH (ref 0–149)

## 2012-07-01 LAB — PAIN MGMT PANEL, URINE W/RFLX CONFIRM
Amphetamine Screen, urine: NEGATIVE ng/mL
Barbiturates Screen, urine: NEGATIVE ng/mL
Benzodiazepines Screen, urine: NEGATIVE ng/mL
Cannabinoid Screen, urine: POSITIVE — AB
Cocaine Metab. Screen, urine: NEGATIVE ng/mL
Creatinine, urine: 97.2 mg/dL (ref 20.0–300.0)
Fentanyl, Urine: NEGATIVE pg/mL
Meperidine Screen, urine: NEGATIVE ng/mL
Methadone Screen, urine: NEGATIVE ng/mL
Opiate Screen, urine: NEGATIVE ng/mL
Oxycodone/Oxymorphone, urine: POSITIVE — AB
Phencyclidine Screen, urine: NEGATIVE ng/mL
Propoxyphene Screen, urine: NEGATIVE ng/mL
Specific Gravity: 1.015
Tramadol Screen, urine: NEGATIVE ng/mL
pH, urine: 7.6 (ref 4.5–8.9)

## 2012-07-01 LAB — CBC W/O DIFF
HCT: 37.4 % (ref 34.0–46.6)
HGB: 11.6 g/dL (ref 11.1–15.9)
MCH: 28.9 pg (ref 26.6–33.0)
MCHC: 31 g/dL — ABNORMAL LOW (ref 31.5–35.7)
MCV: 93 fL (ref 79–97)
PLATELET: 175 10*3/uL (ref 140–415)
RBC: 4.01 x10E6/uL (ref 3.77–5.28)
RDW: 14.1 % (ref 12.3–15.4)
WBC: 8.8 10*3/uL (ref 4.0–10.5)

## 2012-07-01 LAB — VITAMIN D, 25 HYDROXY: VITAMIN D, 25-HYDROXY: 33.3 ng/mL (ref 30.0–100.0)

## 2012-07-01 LAB — HEMOGLOBIN A1C WITH EAG: Hemoglobin A1c: 7.8 % — ABNORMAL HIGH (ref 4.8–5.6)

## 2012-07-01 LAB — URINALYSIS W/ RFLX MICROSCOPIC

## 2012-07-01 LAB — REQUEST PROBLEM

## 2012-07-01 LAB — MICROALBUMIN, RANDOM URINE: Microalbumin, urine: 114.4 ug/mL — ABNORMAL HIGH (ref 0.0–17.0)

## 2012-07-03 NOTE — Progress Notes (Signed)
Quick Note:    Will discuss results at next visit. Pt has elevated HbA1C and abnormal urine screen.  ______

## 2012-07-08 NOTE — Telephone Encounter (Signed)
Per Dr. Pollie Friar , he will discuss at next visit

## 2012-07-08 NOTE — Telephone Encounter (Signed)
Patient called to get lab results. Thanks,

## 2012-07-12 NOTE — Telephone Encounter (Signed)
Spoke with pt and reviewed all.  Pt states she will discuss further with Dr. Pollie Friar at appt on 07/26/12

## 2012-07-26 MED ORDER — INSULIN DETEMIR 100 UNIT/ML (3 ML) SUB-Q PEN
100 unit/mL (3 mL) | Freq: Two times a day (BID) | SUBCUTANEOUS | Status: DC
Start: 2012-07-26 — End: 2012-08-26

## 2012-07-26 MED ORDER — NAPROXEN 500 MG TAB
500 mg | ORAL_TABLET | Freq: Two times a day (BID) | ORAL | Status: DC
Start: 2012-07-26 — End: 2012-10-25

## 2012-07-26 MED ORDER — AMOXICILLIN 875 MG TAB
875 mg | ORAL_TABLET | Freq: Two times a day (BID) | ORAL | Status: DC
Start: 2012-07-26 — End: 2012-10-25

## 2012-07-26 MED ORDER — TRAZODONE 300 MG TAB
300 mg | ORAL_TABLET | Freq: Every evening | ORAL | Status: DC
Start: 2012-07-26 — End: 2012-11-02

## 2012-07-26 MED ORDER — OXYCODONE-ACETAMINOPHEN 5 MG-325 MG TAB
5-325 mg | ORAL_TABLET | Freq: Three times a day (TID) | ORAL | Status: DC | PRN
Start: 2012-07-26 — End: 2012-08-01

## 2012-07-26 NOTE — Progress Notes (Signed)
Patient presents for follow up for her back pain that she rates a 10 today. Patient also here to discuss drug screen from last visit. Reviewed record in preparation for visit and have obtained necessary documentation.

## 2012-07-27 LAB — DRUG PROFILE, UR, 9 DRUGS
Amphetamines: NEGATIVE ng/mL
Barbiturates: NEGATIVE ng/mL
Benzodiazepines: NEGATIVE ng/mL
Cannabinoids: NEGATIVE ng/mL
Cocaine: NEGATIVE ng/mL
Methadone Screen, Urine: NEGATIVE ng/mL
Opiates: NEGATIVE ng/mL
PROPOXYPHENE, URINE: NEGATIVE ng/mL
Phencyclidine: NEGATIVE ng/mL

## 2012-07-29 NOTE — Progress Notes (Signed)
Subjective   Patient presents for follow up of chronic illnesses including Hypertension, Hypercholesterolemia and DM, Type II.  Patient checks BS regularly and they have been elevated with occasional BS over 300.  Patient complains of worsening sinus congestion for few days.  Recent labs discussed.  ROS:   History obtained from the patient   General: negative for - chills, fever or malaise + 3 lb wt loss since last visit.  HEENT: no sores in mouth, vision problems or ear problems   Respiratory: no shortness of breath,  or wheezing   Cardiovascular: no chest pain or dyspnea on exertion or edema   Gastrointestinal: no abdominal pain, change in bowel habits, or black or bloody stools   Neurological: no paresthesias, weakness or dizzyness    Objective  PE:  The patient appears well, alert, oriented x 3, in no distress. Morbidly obese BF  BP 136/60   Pulse 86   Temp(Src) 97.8 ??F (36.6 ??C) (Oral)   Resp 12   Ht 5' 2.5" (1.588 m)   Wt 236 lb (107.049 kg)   BMI 42.48 kg/m2   SpO2 99%  HEENT:  within normal limits except + sinus congestion  Neck: supple. No adenopathy, thyromegaly or carotid bruits.   Chest:  Lungs - clear  CV:  Regular rate and rhythm, no murmurs, rubs or gallops.   Abdomen: soft without tenderness, guarding, mass or organomegaly.   Ext:  No clicks, clubbing or edema  Periph pulses NL. Normal Monofilament exam of feet  Back: + lower lumbar and para lumbar tenderness with limited ROM.   Neuro - +2/4 DTR's LE's, 5/5 strength and normal sensation to LT/PP LE's and negative SLR to 90 degrees.     Impression/Plan  1. Lumbago  oxyCODONE-acetaminophen (PERCOCET) 5-325 mg per tablet, naproxen (NAPROSYN) 500 mg tablet, DRUG PROFILE, UR, 9 DRUGS   2. Bipolar I disorder, most recent episode (or current) depressed, unspecified  traZODone (DESYREL) 300 mg tablet   3. Type II or unspecified type diabetes mellitus without mention of complication, uncontrolled  insulin detemir (LEVEMIR FLEXPEN) 100 unit/mL (3 mL) pen    4. Acute upper respiratory infections of unspecified site  amoxicillin (AMOXIL) 875 mg tablet   5. Encounter for long-term (current) use of other medications  DRUG PROFILE, UR, 9 DRUGS       Pain contract signed and PMP reviewed    Pt advised to quit smoking and advised to continue ADA diet and regular exercise program.  Continue medications as discussed.  Mucinex DM prn.  Reviewed plan of care, patient has provided input and agrees with goals  Patient expressed understanding with the diagnosis and plan.  Recheck in 1 month/prn.

## 2012-08-01 ENCOUNTER — Encounter

## 2012-08-01 MED ORDER — OXYCODONE-ACETAMINOPHEN 5 MG-325 MG TAB
5-325 mg | ORAL_TABLET | Freq: Three times a day (TID) | ORAL | Status: DC | PRN
Start: 2012-08-01 — End: 2012-08-26

## 2012-08-01 NOTE — Telephone Encounter (Signed)
OV 07/26/12, last fill 07/26/12, labs 07/26/12

## 2012-08-26 NOTE — Progress Notes (Signed)
Patient present today for monthly check up and medication refill.

## 2012-08-27 LAB — PAIN MGMT PANEL, URINE W/RFLX CONFIRM
Amphetamine Screen, urine: NEGATIVE ng/mL
Barbiturates Screen, urine: NEGATIVE ng/mL
Benzodiazepines Screen, urine: NEGATIVE ng/mL
Cannabinoid Screen, urine: NEGATIVE ng/mL
Cocaine Metab. Screen, urine: NEGATIVE ng/mL
Creatinine, urine: 110.9 mg/dL (ref 20.0–300.0)
Fentanyl, Urine: NEGATIVE pg/mL
Meperidine Screen, urine: NEGATIVE ng/mL
Methadone Screen, urine: NEGATIVE ng/mL
Opiate Screen, urine: NEGATIVE ng/mL
Oxycodone/Oxymorphone, urine: NEGATIVE ng/mL
Phencyclidine Screen, urine: NEGATIVE ng/mL
Propoxyphene Screen, urine: NEGATIVE ng/mL
Specific Gravity: 1.023
Tramadol Screen, urine: NEGATIVE ng/mL
pH, urine: 5.8 (ref 4.5–8.9)

## 2012-08-28 NOTE — Progress Notes (Signed)
Subjective   Patient presents for follow up of chronic illnesses including Hypertension, Hypercholesterolemia and DM, Type II.  Patient checks BS regularly and they have been elevated with range between 200- 300.    ROS:   History obtained from the patient   General: negative for - chills, fever or malaise + 3 lb wt gain since last visit.  HEENT: no sores in mouth, vision problems or ear problems   Respiratory: no shortness of breath,  or wheezing   Cardiovascular: no chest pain or dyspnea on exertion or edema   Gastrointestinal: no abdominal pain, change in bowel habits, or black or bloody stools   Neurological: no paresthesias, weakness or dizzyness    Objective  PE:  The patient appears well, alert, oriented x 3, in no distress. Morbidly obese BF  BP 130/90   Pulse 97   Temp 97.9 ??F (36.6 ??C) (Oral)   Resp 14   Wt 238 lb 12.8 oz (108.319 kg)   SpO2 94%  HEENT:  within normal limits except + sinus congestion  Neck: supple. No adenopathy, thyromegaly or carotid bruits.   Chest:  Lungs - clear  CV:  Regular rate and rhythm, no murmurs, rubs or gallops.   Abdomen: soft without tenderness, guarding, mass or organomegaly.   Ext:  No clicks, clubbing or edema  Periph pulses NL. Normal Monofilament exam of feet  Back: + lower lumbar and para lumbar tenderness with limited ROM.   Neuro - +2/4 DTR's LE's, 5/5 strength and normal sensation to LT/PP LE's and negative SLR to 90 degrees.     Impression/Plan  1. DM w/o Complication Type II     2. Lumbago  PAIN MGMT PANEL W/ REFX, UR, oxyCODONE-acetaminophen (PERCOCET) 5-325 mg per tablet   3. Essential hypertension, benign     4. Tobacco use disorder     5. Encounter for long-term (current) use of other medications  PAIN MGMT PANEL W/ REFX, UR   6. Type II or unspecified type diabetes mellitus without mention of complication, uncontrolled  insulin lispro (HUMALOG KWIKPEN) 100 unit/mL kwikpen, insulin detemir (LEVEMIR FLEXPEN) 100 unit/mL (3 mL) pen   7. Type II or  unspecified type diabetes mellitus without mention of complication, not stated as uncontrolled  Insulin Needles, Disposable, (NOVOFINE 32) 32 x 1/4 " Ndle       Pain contract signed and PMP reviewed    Pt advised to quit smoking and advised to continue 1500 calorie ADA diet and regular exercise program.  Continue medications except increase insulin dosage as discussed.    Reviewed plan of care, patient has provided input and agrees with goals  Patient expressed understanding with the diagnosis and plan.  Recheck in 1 month/prn.

## 2012-09-13 ENCOUNTER — Encounter

## 2012-09-13 NOTE — Telephone Encounter (Signed)
Patient needs a refill of the following  Requested Prescriptions     Pending Prescriptions Disp Refills   ??? omeprazole (PRILOSEC) 40 mg capsule 30 Cap 11     Sig: Take 1 Cap by mouth daily.

## 2012-09-13 NOTE — Telephone Encounter (Signed)
Last seen on 08/26/12. Last written on 09/02/11. Thanks

## 2012-09-26 NOTE — Patient Instructions (Signed)
Diabetes Diet Guidelines: After Your Visit  Your Care Instructions  A healthy diet is important to manage diabetes. It helps keep your blood sugar at a target level (which you set with your doctor). A diet for diabetes does not mean that you have to eat special foods. You can eat what your family eats, including sweets once in a while. But you do have to pay attention to how often you eat and how much you eat of certain foods.  Managing the amount of carbohydrate you eat is an important part of a healthy diet for diabetes. Carbohydrate is found in sugar and sweets, grains, fruit, starchy vegetables, and milk and yogurt.  You may want to work with a dietitian or a certified diabetes educator (CDE) to help you plan meals and snacks. A dietitian or CDE can also help you lose weight if that is one of your goals.  Follow-up care is a key part of your treatment and safety. Be sure to make and go to all appointments, and call your doctor if you are having problems. It???s also a good idea to know your test results and keep a list of the medicines you take.  How can you care for yourself at home?  ?? Learn which foods have carbohydrate.   ?? Bread, cereal, pasta, and rice have about 15 grams of carbohydrate in a serving. A serving is 1 slice of bread (1 ounce), ?? cup of cooked cereal, or 1/3 cup of cooked pasta or rice.   ?? Fruits have 15 grams of carbohydrate in a serving. A serving is 1 small fresh fruit, such as an apple or orange; ?? of a banana; ?? cup of cooked or canned fruit; ?? cup of fruit juice; 1 cup of melon or raspberries; or 2 tablespoons of dried fruit.   ?? Milk and no-sugar-added yogurt have 15 grams of carbohydrate in a serving. A serving is 1 cup of milk or 2/3 cup of no-sugar-added yogurt.   ?? Starchy vegetables have 15 grams of carbohydrate in a serving. A serving is ?? cup of mashed potatoes or sweet potato; 1 cup winter squash; ?? of a small baked potato; 1/4 cup of cooked dried beans; or ?? cup cooked corn  or green peas.   ?? Learn how much carbohydrate to eat each day. A dietitian or CDE can teach you how to keep track of the amount of carbohydrate you eat. This is called carbohydrate counting.   ?? Try to eat about the same amount of carbohydrate at each meal. Your dietitian or CDE can tell you how much carbohydrate to eat at each meal and snack. Do not "save up" your daily allowance of carbohydrate to eat at one meal.   ?? If you are not sure how to count carbohydrate grams, use the Plate Method to plan meals. It is a good, quick way to make sure that you have a balanced meal. It also helps you spread carbohydrate throughout the day. Divide your plate by types of foods. Put vegetables on half the plate, meat or other protein food on one-quarter of the plate, and a grain or starchy vegetable (such as brown rice or a potato) in the final quarter of the plate. To this you can add a small piece of fruit and 1 cup of milk or yogurt, depending on how much carbohydrate you are supposed to eat at a meal.   ?? Limit saturated fat, such as the fat from meat and dairy products.   Choose lean cuts of meat and nonfat or low-fat dairy products. Use olive or canola oil instead of butter or shortening when cooking. This is a healthy choice because people who have diabetes are at higher-than-average risk of heart disease.   ?? Do not skip meals. Your blood sugar may drop too low if you skip meals and take certain diabetes pills or insulin.   ?? Work with your doctor to write up a sick-day plan for what to do on days when you are sick. Your blood sugar can go up or down depending on your illness and whether you can keep food down. Call your doctor when you are sick, to see if you need to adjust your pills or insulin.   ?? Check with your doctor before you drink alcohol. Alcohol can cause your blood sugar to drop too low. Alcohol can also cause a bad reaction if you take certain diabetes pills.     Where can you learn more?    Go to  [email or web address]   Enter I147 in the search box to learn more about "Diabetes Diet Guidelines: After Your Visit."    ?? 2006-2013 [employer]. Care instructions adapted under license by [employer] (which disclaims liability or warranty for this information). This care instruction is for use with your licensed healthcare professional. If you have questions about a medical condition or this instruction, always ask your healthcare professional. [employer] disclaims any warranty or liability for your use of this information.  Content Version: 9.7.130178; Last Revised: December 01, 2010          Type 2 Diabetes: After Your Visit  Your Care Instructions  Type 2 diabetes is a lifelong disease that develops when the pancreas cannot make enough insulin or when the body's tissues cannot use insulin properly. Insulin is a hormone that helps the body???s cells use sugar (glucose) for energy. It also helps the body store extra sugar in muscle, fat, and liver cells.  Without insulin, the sugar cannot get into the cells to do its work. It stays in the blood instead. This can cause high blood sugar levels. A person has diabetes when the blood sugar stays too high too much of the time. Over time, diabetes can lead to diseases of the heart, blood vessels, nerves, kidneys, and eyes.  You may be able to control your blood sugar by losing weight, eating a healthy diet, and getting daily exercise. You may also have to take pills or insulin shots (or both).  Follow-up care is a key part of your treatment and safety. Be sure to make and go to all appointments, and call your doctor if you are having problems. It???s also a good idea to know your test results and keep a list of the medicines you take.  How can you care for yourself at home?  ?? Keep your blood sugar at a target level (which you set with your doctor).   ?? Eat a good diet that spreads carbohydrate throughout the day. Carbohydrate???the  body's main source of fuel???affects blood sugar more than any other nutrient. Carbohydrate is in fruits, vegetables, milk, and yogurt. It also is in breads, cereals, vegetables such as potatoes and corn, and sugary foods such as candy and cakes.   ?? Aim for 30 minutes of exercise on most, preferably all, days of the week. Walking is a good choice. You also may want to do other activities, such as running, swimming, cycling, or playing tennis or team sports.   ??  Take your medicines exactly as prescribed. Call your doctor if you think you are having a problem with your medicine. You will get more details on the specific medicines your doctor prescribes.   ?? Check your blood sugar as often as your doctor recommends. It is important to keep track of any symptoms you have, such as low blood sugar, and any changes in your activities, diet, or insulin use.   ?? Take a daily low-dose aspirin if your doctor suggests it. Aspirin may lower your risk of heart attack.   ?? Do not smoke. If you need help quitting, talk to your doctor about stop-smoking programs and medicines. These can increase your chances of quitting for good.   ?? Keep your cholesterol and blood pressure at normal levels. You may need to take one or more medicines to reach your goals. Take them exactly as directed. Do not stop or change a medicine without talking to your doctor first.   When should you call for help?  Call 911 anytime you think you may need emergency care. For example, call if:  ?? You passed out (lost consciousness), or you suddenly become very sleepy or confused. (You may have very low blood sugar.)   Call your doctor now or seek immediate medical care if:  ?? Your blood sugar is 300 mg/dL or is higher than the level your doctor has set for you.   ?? You have symptoms of low blood sugar, such as:   ?? Sweating.   ?? Feeling nervous, shaky, and weak.   ?? Extreme hunger and slight nausea.   ?? Dizziness and headache.   ?? Blurred vision.   ?? Confusion.    Watch closely for changes in your health, and be sure to contact your doctor if:  ?? You often have problems controlling your blood sugar.   ?? You have symptoms of long-term diabetes problems, such as:   ?? New vision changes.   ?? New pain, numbness, or tingling in your hands or feet.   ?? Skin problems.     Where can you learn more?    Go to [email or web address]   Enter C553 in the search box to learn more about "Type 2 Diabetes: After Your Visit."    ?? 2006-2013 [employer]. Care instructions adapted under license by [employer] (which disclaims liability or warranty for this information). This care instruction is for use with your licensed healthcare professional. If you have questions about a medical condition or this instruction, always ask your healthcare professional. [employer] disclaims any warranty or liability for your use of this information.  Content Version: 9.7.130178; Last Revised: July 08, 2010          Plantar Fasciitis: After Your Visit  Your Care Instructions  Plantar fasciitis is pain and inflammation of the plantar fascia, the tissue at the bottom of your foot that connects the heel bone to the toes. The plantar fascia also supports the arch. If you strain the plantar fascia, it can develop small tears and cause heel pain when you stand or walk.  Plantar fasciitis can be caused by running or other sports. It also may occur in people who are overweight or who have high arches or flat feet. You may get plantar fasciitis if you walk or stand for long periods, or have a tight Achilles tendon or calf muscles.  You can improve your foot pain with rest and other care at home. It might take a few weeks to  a few months for your foot to heal completely.  Follow-up care is a key part of your treatment and safety. Be sure to make and go to all appointments, and call your doctor if you are having problems. It's also a good idea to know your test results and keep a  list of the medicines you take.  How can you care for yourself at home?  ?? Rest your feet often. Reduce your activity to a level that lets you avoid pain. If possible, do not run or walk on hard surfaces.   ?? Take pain medicines exactly as directed.   ?? If the doctor gave you a prescription medicine for pain, take it as prescribed.   ?? If you are not taking a prescription pain medicine, take an over-the-counter anti-inflammatory medicine for pain and swelling, such as ibuprofen (Advil, Motrin) or naproxen (Aleve). Read and follow all instructions on the label.   ?? Use ice massage to help with pain and swelling. You can use an ice cube or an ice cup several times a day. To make an ice cup, fill a paper cup with water and freeze it. Cut off the top of the cup until a half-inch of ice shows. Hold onto the remaining paper to use the cup. Rub the ice in small circles over the area for 5 to 7 minutes.   ?? Contrast baths, which alternate hot and cold water, can also help reduce swelling. But because heat alone may make pain and swelling worse, end a contrast bath with a soak in cold water.   ?? Wear a night splint if your doctor suggests it. A night splint holds your foot with the toes pointed up and the foot and ankle at a 90-degree angle. This position gives the bottom of your foot a constant, gentle stretch.   ?? Do simple exercises such as calf stretches and towel stretches 2 to 3 times each day, especially when you first get up in the morning. These can help the plantar fascia become more flexible. They also make the muscles that support your arch stronger. Hold these stretches for 15 to 30 seconds per stretch. Repeat 2 to 4 times.   ?? Stand about 1 foot from a wall. Place the palms of both hands against the wall at chest level. Lean forward against the wall, keeping one leg with the knee straight and heel on the ground while bending the knee of the other leg.   ?? Sit down on the floor or a mat with your feet stretched  in front of you. Roll up a towel lengthwise, and loop it over the ball of your foot. Holding the towel at both ends, gently pull the towel toward you to stretch your foot.   ?? Wear shoes with good arch support. Athletic shoes or shoes with a well-cushioned sole are good choices.   ?? Try heel cups or shoe inserts (orthotics) to help cushion your heel. You can buy these at many shoe stores.   ?? Put on your shoes as soon as you get out of bed. Going barefoot or wearing slippers may make your pain worse.   ?? Reach and stay at a good weight for your height. This puts less strain on your feet.   When should you call for help?  Call your doctor now or seek immediate medical care if:  ?? You have heel pain with fever, redness, or warmth in your heel.   ?? You cannot put  weight on the sore foot.   Watch closely for changes in your health, and be sure to contact your doctor if:  1. You have numbness or tingling in your heel.   2. Your heel pain lasts more than 2 weeks.     Where can you learn more?    Go to [email or web address]   Enter X351 in the search box to learn more about "Plantar Fasciitis: After Your Visit."    ?? 2006-2013 [employer]. Care instructions adapted under license by [employer] (which disclaims liability or warranty for this information). This care instruction is for use with your licensed healthcare professional. If you have questions about a medical condition or this instruction, always ask your healthcare professional. [employer] disclaims any warranty or liability for your use of this information.  Content Version: 9.7.130178; Last Revised: January 29, 2010          Plantar Fasciitis: Exercises  Your Care Instructions  Here are some examples of typical rehabilitation exercises for your condition. Start each exercise slowly. Ease off the exercise if you start to have pain.  Your doctor or physical therapist will tell you when you can start these exercises and  which ones will work best for you.  How to do the exercises  Note: Each exercise should create a pulling feeling but should not cause pain.  Towel stretch    3. Sit with your legs extended and knees straight.   4. Place a towel around your foot just under the toes. A towel will give you a more effective stretch.   5. Hold each end of the towel in each hand, with your hands above your knees.   6. Pull back with the towel so that your foot stretches toward you.   7. Hold the position for at least 15 to 30 seconds.   8. Repeat 2 to 4 times a session, up to 5 sessions a day.   Calf stretch    Note: This exercise stretches the muscles at the back of the lower leg (the calf) and the Achilles tendon. Do this exercise 3 or 4 times a day, 5 days a week.  1. Stand facing a wall with your hands on the wall at about eye level. Put the leg you want to stretch about a step behind your other leg.   2. Keeping your back heel on the floor, bend your front knee until you feel a stretch in the back leg.   3. Hold the stretch for 15 to 30 seconds. Repeat 2 to 4 times.   Plantar fascia and calf stretch    Note: Stretching the plantar fascia and calf muscles can increase flexibility and decrease heel pain. You can do this exercise several times each day and before and after activity.  1. Stand on a step as shown above. Be sure to hold on to the banister.   2. Slowly let your heels down over the edge of the step as you relax your calf muscles. You should feel a gentle stretch across the bottom of your foot and up the back of your leg to your knee.   3. Hold the stretch about 15 to 30 seconds, and then tighten your calf muscle a little to bring your heel back up to the level of the step. Repeat 2 to 4 times.   Towel curls    1. While sitting, place your foot on a towel on the floor and scrunch the towel toward you with  your toes.   2. Then, also using your toes, push the towel away from you.   Note: Make this exercise more challenging by  placing a weighted object, such as a soup can, on the other end of the towel.  Marble pickups    1. Put marbles on the floor next to a cup.   2. Using your toes, try to lift the marbles up from the floor and put them in the cup.   Follow-up care is a key part of your treatment and safety. Be sure to make and go to all appointments, and call your doctor if you are having problems. It's also a good idea to know your test results and keep a list of the medicines you take.    Where can you learn more?    Go to [email or web address]   Enter [phone] in the search box to learn more about "Plantar Fasciitis: Exercises."    ?? 2006-2013 [employer]. Care instructions adapted under license by [employer] (which disclaims liability or warranty for this information). This care instruction is for use with your licensed healthcare professional. If you have questions about a medical condition or this instruction, always ask your healthcare professional. [employer] disclaims any warranty or liability for your use of this information.  Content Version: 9.7.130178; Last Revised: October 24, 2010

## 2012-09-26 NOTE — Progress Notes (Signed)
Patient present today for medication refill and follow up from visit 08/26/12. Declined flu shot at this time. States she never takes them. Patient states she just had an EKG at [facility] and doesn't feel she needs an EKG done at this time. Will discuss with MD, stated by patient.   Chief Complaint   Patient presents with   ??? Medication Refill     follow up     "Reviewed record in preparation for visit and have obtained the necessary documentation"

## 2012-09-29 NOTE — Progress Notes (Signed)
Subjective   Patient presents for follow up of chronic illnesses including Hypertension, Hypercholesterolemia and DM, Type II.  Patient checks BS regularly and they have been mildly elevated.  Pt also complains of persistent left heel pain for 1 month with first steps in am and after sitting for awhile.  ROS:   History obtained from the patient   General: negative for - chills, fever or malaise + 2 lb wt gain since last visit.  HEENT: no sores in mouth, vision problems or ear problems   Respiratory: no shortness of breath,  or wheezing   Cardiovascular: no chest pain or dyspnea on exertion or edema   Gastrointestinal: no abdominal pain, change in bowel habits, or black or bloody stools   Neurological: no paresthesias, weakness or dizzyness    Objective  PE:  The patient appears well, alert, oriented x 3, in no distress. Morbidly obese BF  BP 130/80   Pulse 98   Temp 99 ??F (37.2 ??C) (Oral)   Resp 16   Ht 5\' 2"  (1.575 m)   Wt 240 lb (108.863 kg)   BMI 43.90 kg/m2   SpO2 95%  HEENT:  within normal limits   Neck: supple. No adenopathy, thyromegaly or carotid bruits.   Chest:  Lungs - clear  CV:  Regular rate and rhythm, no murmurs, rubs or gallops.   Abdomen: soft without tenderness, guarding, mass or organomegaly.   Ext:  No clicks, clubbing or edema  Periph pulses NL. Normal Monofilament exam of feet  + L ventral medial heel tenderness  Back: + lower lumbar and para lumbar tenderness with limited ROM.   Neuro - +2/4 DTR's LE's, 5/5 strength and normal sensation to LT/PP LE's and negative SLR to 90 degrees.     Impression/Plan  1. Lumbago  oxyCODONE-acetaminophen (PERCOCET) 5-325 mg per tablet   2. Bipolar I disorder, most recent episode (or current) depressed, unspecified     3. Plantar fascial fibromatosis  METHYLPREDNISOLONE ACETATE INJECTION 40 MG, PR INJECT TENDON SHEATH/LIGAMENT   4. DM w/o Complication Type II  REFERRAL TO OPHTHALMOLOGY   5. Tobacco use disorder         Pain contract signed and PMP reviewed     Procedure: After informed consent 40 mg Depomedrol mixed with 1cc lidocaine was injected into L heel with 25 g 1 " needle after iodine prep/ethyl chloride local anesthesia.  Patient tolerated procedure well without complications.   Pt advised to quit smoking and advised to continue 1500 calorie ADA diet and regular exercise program.  Continue medications except increase insulin dosage as discussed.    Reviewed plan of care, patient has provided input and agrees with goals  Patient expressed understanding with the diagnosis and plan.  Recheck in 1 month/prn.

## 2012-10-10 NOTE — Telephone Encounter (Signed)
Patient called to let you know she is seeing cardiologist next Monday, 10/28.

## 2012-10-14 ENCOUNTER — Encounter

## 2012-10-14 NOTE — Telephone Encounter (Signed)
E scribed on 10/12/12 but came back in the error bucket please sign again

## 2012-10-20 NOTE — Telephone Encounter (Signed)
Patient called and stated that she would like for Dr. Pollie Friar to call her to discuss a personal matter.  Please call at [phone]

## 2012-10-21 NOTE — Telephone Encounter (Signed)
Pt called and discussed recent Cardiology visit and was diagnosed with PVD and patient may need medication.  Pt will follow up next week to discuss.

## 2012-10-25 NOTE — Progress Notes (Signed)
Chief Complaint   Patient presents with   ??? Follow-up     meds and check up     "Reviewed record in preparation for visit and have obtained the necessary documentation"

## 2012-10-25 NOTE — Patient Instructions (Signed)
Diabetes Diet Guidelines: After Your Visit  Your Care Instructions  A healthy diet is important to manage diabetes. It helps keep your blood sugar at a target level (which you set with your doctor). A diet for diabetes does not mean that you have to eat special foods. You can eat what your family eats, including sweets once in a while. But you do have to pay attention to how often you eat and how much you eat of certain foods.  Managing the amount of carbohydrate you eat is an important part of a healthy diet for diabetes. Carbohydrate is found in sugar and sweets, grains, fruit, starchy vegetables, and milk and yogurt.  You may want to work with a dietitian or a certified diabetes educator (CDE) to help you plan meals and snacks. A dietitian or CDE can also help you lose weight if that is one of your goals.  Follow-up care is a key part of your treatment and safety. Be sure to make and go to all appointments, and call your doctor if you are having problems. It???s also a good idea to know your test results and keep a list of the medicines you take.  How can you care for yourself at home?  ?? Learn which foods have carbohydrate.  ?? Bread, cereal, pasta, and rice have about 15 grams of carbohydrate in a serving. A serving is 1 slice of bread (1 ounce), ?? cup of cooked cereal, or 1/3 cup of cooked pasta or rice.  ?? Fruits have 15 grams of carbohydrate in a serving. A serving is 1 small fresh fruit, such as an apple or orange; ?? of a banana; ?? cup of cooked or canned fruit; ?? cup of fruit juice; 1 cup of melon or raspberries; or 2 tablespoons of dried fruit.  ?? Milk and no-sugar-added yogurt have 15 grams of carbohydrate in a serving. A serving is 1 cup of milk or 2/3 cup of no-sugar-added yogurt.  ?? Starchy vegetables have 15 grams of carbohydrate in a serving. A serving is ?? cup of mashed potatoes or sweet potato; 1 cup winter squash; ?? of a small baked potato; 1/4 cup of cooked dried beans; or ?? cup cooked corn or  green peas.  ?? Learn how much carbohydrate to eat each day. A dietitian or CDE can teach you how to keep track of the amount of carbohydrate you eat. This is called carbohydrate counting.  ?? Try to eat about the same amount of carbohydrate at each meal. Your dietitian or CDE can tell you how much carbohydrate to eat at each meal and snack. Do not "save up" your daily allowance of carbohydrate to eat at one meal.  ?? If you are not sure how to count carbohydrate grams, use the Plate Method to plan meals. It is a good, quick way to make sure that you have a balanced meal. It also helps you spread carbohydrate throughout the day. Divide your plate by types of foods. Put vegetables on half the plate, meat or other protein food on one-quarter of the plate, and a grain or starchy vegetable (such as brown rice or a potato) in the final quarter of the plate. To this you can add a small piece of fruit and 1 cup of milk or yogurt, depending on how much carbohydrate you are supposed to eat at a meal.  ?? Limit saturated fat, such as the fat from meat and dairy products. Choose lean cuts of meat and nonfat or   low-fat dairy products. Use olive or canola oil instead of butter or shortening when cooking. This is a healthy choice because people who have diabetes are at higher-than-average risk of heart disease.  ?? Do not skip meals. Your blood sugar may drop too low if you skip meals and take certain diabetes pills or insulin.  ?? Work with your doctor to write up a sick-day plan for what to do on days when you are sick. Your blood sugar can go up or down depending on your illness and whether you can keep food down. Call your doctor when you are sick, to see if you need to adjust your pills or insulin.  ?? Check with your doctor before you drink alcohol. Alcohol can cause your blood sugar to drop too low. Alcohol can also cause a bad reaction if you take certain diabetes pills.    Where can you learn more?    Go to  http://www.healthwise.net/[facility]   Enter I147 in the search box to learn more about "Diabetes Diet Guidelines: After Your Visit."    ?? 2006-2013 Healthwise, Incorporated. Care instructions adapted under license by [employer] (which disclaims liability or warranty for this information). This care instruction is for use with your licensed healthcare professional. If you have questions about a medical condition or this instruction, always ask your healthcare professional. Healthwise, Incorporated disclaims any warranty or liability for your use of this information.  Content Version: 9.8.193578; Last Revised: December 01, 2010

## 2012-10-29 NOTE — Progress Notes (Signed)
Subjective:  Patient presents for follow up of chronic illnesses with DM and HTN and with spondylosis, primarily affecting the back. Pain is described as aching, severe and is intermittent.    Related to injury: no.  Associated symptoms include: decreased ROM.    Rheumatological ROS: ongoing significant pain in back which is stable and controlled by PRN meds.   Pain score without treatment:  Pain score with treatment:   Response to treatment plan: stable.   Patient has negative FH but + history of personal use of marijuana in the past.  Pt denies recent use.   Pt is also seeing Podiatrist for L heel spur and has had steroid injections.  Pt also complains of persistent cough of green mucous for 2 weeks.  Pt also here to discuss recent Adenosine Cardiolyte test by Dr. Oscar La which was normal.  She is having peripheral vascular test soon.    Allergies   Allergen Reactions   ??? Niaspan (Niacin) Rash   ??? Aspirin Nausea Only   ??? Ibuprofen Swelling     Past Medical History   Diagnosis Date   ??? Asthma    ??? GERD (gastroesophageal reflux disease)    ??? Hypertension    ??? Psychotic disorder      bipolar   ??? Cyst      excision of a chronic abscess of left axilla   ??? Diabetes    ??? Arthritis      Past Surgical History   Procedure Laterality Date   ??? Hx tonsillectomy     ??? Pr abdomen surgery proc unlisted     ??? Hx gyn  2004     hysterectomy   ??? Hx wisdom teeth extraction       Family History   Problem Relation Age of Onset   ??? Diabetes Mother    ??? Hypertension Mother    ??? Stroke Mother    ??? Cancer Mother      kidney   ??? Cancer Father      colon   ??? Stroke Father    ??? Depression Sister    ??? Depression Brother      History   Substance Use Topics   ??? Smoking status: Light Tobacco Smoker -- 0.50 packs/day for 30 years     Types: Cigarettes     Last Attempt to Quit: 06/21/2011   ??? Smokeless tobacco: Never Used   ??? Alcohol Use: Yes      Comment: socially        Review of Systems  A comprehensive review of systems was negative except for  that written in the HPI.    Objective:    The patient appears well, alert, oriented x 3, in no distress. Morbidly obese BF  BP 139/94   Pulse 92   Temp(Src) 96.2 ??F (35.7 ??C) (Oral)   Resp 15   Ht 5\' 2"  (1.575 m)   Wt 250 lb (113.399 kg)   BMI 45.71 kg/m2   SpO2 97%   HEENT: WNL  Neck: Supple without LA, TM, or bruits  Chest: Lungs + rhonchi but no wheezes or rales.   CV: S1 and S2 normal, no murmurs, rubs or gallops, regular rate and rhythm.   Abdomen: soft without tenderness, guarding, mass or organomegaly.   Musculoskeletal:  Back: + lower lumbar and para lumbar tenderness with limited ROM.   Extremities: show no edema,  Mild decreased peripheral pulses. + L heel ventral tenderness at plantar fascia insertion  Neurological  is normal without focal findings.   Functional Assessment: Patient is ambulatory and accomplishing tasks of IADL's and ADL's    Impression/Plan  1. Essential hypertension, benign  valsartan (DIOVAN) 160 mg tablet   2. Mixed hyperlipidemia  fenofibrate (TRICOR) 160 mg tablet, simvastatin (ZOCOR) 40 mg tablet   3. Lumbago  naproxen (NAPROSYN) 500 mg tablet, oxyCODONE-acetaminophen (PERCOCET) 5-325 mg per tablet   4. Type II or unspecified type diabetes mellitus without mention of complication, uncontrolled  insulin detemir (LEVEMIR FLEXPEN) 100 unit/mL (3 mL) pen, insulin lispro (HUMALOG KWIKPEN) 100 unit/mL kwikpen   5. Bipolar I disorder, most recent episode (or current) depressed, unspecified  ALPRAZolam (XANAX) 1 mg tablet   6. Peripheral vascular disease, unspecified  clopidogrel (PLAVIX) 75 mg tablet   7. Acute bronchitis  azithromycin (ZITHROMAX) 250 mg tablet   8. Esophageal reflux  pantoprazole (PROTONIX) 40 mg tablet       Pt is not fasting and will get labs next visit.  Pain contract in place.  PMP reviewed  Last Random Drug screening: 08/26/12  Side effects and use of medications discussed and patient understands and agrees with plan.  Pt is achieving goals of treatment such as  ambulating and able to care for self with minimal pain at this time on medication.  Pt's pain levels are at goal on medications - between 1-2/10  Pt referred to pain management for further evaluation/pain management.

## 2012-10-30 ENCOUNTER — Encounter

## 2012-11-02 ENCOUNTER — Encounter

## 2012-11-03 ENCOUNTER — Encounter

## 2012-11-03 NOTE — Telephone Encounter (Signed)
Patient would like for you to call her.   She would not elaborate on what she wanted.  Please call at [phone]

## 2012-11-03 NOTE — Telephone Encounter (Signed)
Pt called and discussed peripheral vascular studies and also patient wanted to use Nicotrol inhaler for smoking cessation.

## 2012-11-16 ENCOUNTER — Encounter

## 2012-11-16 NOTE — Telephone Encounter (Signed)
10/25/2012 last office visit last refill 11/03/12 - thanks

## 2012-11-25 NOTE — Progress Notes (Signed)
Patient here for follow up.  Med reconciliation correct and up to date with changes made with patient at this time.  Reviewed record in preparation for visit and have obtained necessary documentation.  Chief Complaint   Patient presents with   ??? Follow-up

## 2012-11-25 NOTE — Patient Instructions (Signed)
Plantar Fasciitis: Exercises  Your Care Instructions  Here are some examples of typical rehabilitation exercises for your condition. Start each exercise slowly. Ease off the exercise if you start to have pain.  Your doctor or physical therapist will tell you when you can start these exercises and which ones will work best for you.  How to do the exercises  Note: Each exercise should create a pulling feeling but should not cause pain.  Towel stretch    1. Sit with your legs extended and knees straight.  2. Place a towel around your foot just under the toes. A towel will give you a more effective stretch.  3. Hold each end of the towel in each hand, with your hands above your knees.  4. Pull back with the towel so that your foot stretches toward you.  5. Hold the position for at least 15 to 30 seconds.  6. Repeat 2 to 4 times a session, up to 5 sessions a day.  Calf stretch    Note: This exercise stretches the muscles at the back of the lower leg (the calf) and the Achilles tendon. Do this exercise 3 or 4 times a day, 5 days a week.  1. Stand facing a wall with your hands on the wall at about eye level. Put the leg you want to stretch about a step behind your other leg.  2. Keeping your back heel on the floor, bend your front knee until you feel a stretch in the back leg.  3. Hold the stretch for 15 to 30 seconds. Repeat 2 to 4 times.  Plantar fascia and calf stretch    Note: Stretching the plantar fascia and calf muscles can increase flexibility and decrease heel pain. You can do this exercise several times each day and before and after activity.  1. Stand on a step as shown above. Be sure to hold on to the banister.  2. Slowly let your heels down over the edge of the step as you relax your calf muscles. You should feel a gentle stretch across the bottom of your foot and up the back of your leg to your knee.  3. Hold the stretch about 15 to 30 seconds, and then tighten your calf muscle a little to bring your heel  back up to the level of the step. Repeat 2 to 4 times.  Towel curls    ?? While sitting, place your foot on a towel on the floor and scrunch the towel toward you with your toes.  ?? Then, also using your toes, push the towel away from you.  Note: Make this exercise more challenging by placing a weighted object, such as a soup can, on the other end of the towel.  Marble pickups    ?? Put marbles on the floor next to a cup.  ?? Using your toes, try to lift the marbles up from the floor and put them in the cup.  Follow-up care is a key part of your treatment and safety. Be sure to make and go to all appointments, and call your doctor if you are having problems. It's also a good idea to know your test results and keep a list of the medicines you take.    Where can you learn more?    Go to [email or web address]   Enter [phone] in the search box to learn more about "Plantar Fasciitis: Exercises."    ?? 2006-2013 Healthwise, Incorporated. Care instructions adapted under license by [employer] (  which disclaims liability or warranty for this information). This care instruction is for use with your licensed healthcare professional. If you have questions about a medical condition or this instruction, always ask your healthcare professional. Healthwise, Incorporated disclaims any warranty or liability for your use of this information.  Content Version: 9.8.193578; Last Revised: October 24, 2010                Plantar Fasciitis: After Your Visit  Your Care Instructions  Plantar fasciitis is pain and inflammation of the plantar fascia, the tissue at the bottom of your foot that connects the heel bone to the toes. The plantar fascia also supports the arch. If you strain the plantar fascia, it can develop small tears and cause heel pain when you stand or walk.  Plantar fasciitis can be caused by running or other sports. It also may occur in people who are overweight or who have high arches or flat feet. You may get plantar  fasciitis if you walk or stand for long periods, or have a tight Achilles tendon or calf muscles.  You can improve your foot pain with rest and other care at home. It might take a few weeks to a few months for your foot to heal completely.  Follow-up care is a key part of your treatment and safety. Be sure to make and go to all appointments, and call your doctor if you are having problems. It's also a good idea to know your test results and keep a list of the medicines you take.  How can you care for yourself at home?  ?? Rest your feet often. Reduce your activity to a level that lets you avoid pain. If possible, do not run or walk on hard surfaces.  ?? Take pain medicines exactly as directed.  ?? If the doctor gave you a prescription medicine for pain, take it as prescribed.  ?? If you are not taking a prescription pain medicine, take an over-the-counter anti-inflammatory medicine for pain and swelling, such as ibuprofen (Advil, Motrin) or naproxen (Aleve). Read and follow all instructions on the label.  ?? Use ice massage to help with pain and swelling. You can use an ice cube or an ice cup several times a day. To make an ice cup, fill a paper cup with water and freeze it. Cut off the top of the cup until a half-inch of ice shows. Hold onto the remaining paper to use the cup. Rub the ice in small circles over the area for 5 to 7 minutes.  ?? Contrast baths, which alternate hot and cold water, can also help reduce swelling. But because heat alone may make pain and swelling worse, end a contrast bath with a soak in cold water.  ?? Wear a night splint if your doctor suggests it. A night splint holds your foot with the toes pointed up and the foot and ankle at a 90-degree angle. This position gives the bottom of your foot a constant, gentle stretch.  ?? Do simple exercises such as calf stretches and towel stretches 2 to 3 times each day, especially when you first get up in the morning. These can help the plantar fascia become  more flexible. They also make the muscles that support your arch stronger. Hold these stretches for 15 to 30 seconds per stretch. Repeat 2 to 4 times.  ?? Stand about 1 foot from a wall. Place the palms of both hands against the wall at chest level. Lean forward against  the wall, keeping one leg with the knee straight and heel on the ground while bending the knee of the other leg.  ?? Sit down on the floor or a mat with your feet stretched in front of you. Roll up a towel lengthwise, and loop it over the ball of your foot. Holding the towel at both ends, gently pull the towel toward you to stretch your foot.  ?? Wear shoes with good arch support. Athletic shoes or shoes with a well-cushioned sole are good choices.  ?? Try heel cups or shoe inserts (orthotics) to help cushion your heel. You can buy these at many shoe stores.  ?? Put on your shoes as soon as you get out of bed. Going barefoot or wearing slippers may make your pain worse.  ?? Reach and stay at a good weight for your height. This puts less strain on your feet.  When should you call for help?  Call your doctor now or seek immediate medical care if:  ?? You have heel pain with fever, redness, or warmth in your heel.  ?? You cannot put weight on the sore foot.  Watch closely for changes in your health, and be sure to contact your doctor if:  ?? You have numbness or tingling in your heel.  ?? Your heel pain lasts more than 2 weeks.    Where can you learn more?    Go to [email or web address]   Enter X351 in the search box to learn more about "Plantar Fasciitis: After Your Visit."    ?? 2006-2013 [employer], [employer]. Care instructions adapted under license by [employer] (which disclaims liability or warranty for this information). This care instruction is for use with your licensed healthcare professional. If you have questions about a medical condition or this instruction, always ask your healthcare professional. [employer]wise, Inc[employer] disclaims  any warranty or liability for your use of this information.  Content Version: 9.8.193578; Last Revised: February 16, 2012

## 2012-11-27 NOTE — Progress Notes (Addendum)
Subjective:  Patient presents for follow up of chronic illnesses with DM and HTN and with spondylosis, primarily affecting the back. Pain is described as aching, severe and is intermittent.    Related to injury: no.  Associated symptoms include: decreased ROM.    Rheumatological ROS: ongoing significant pain in back which is stable and controlled by PRN meds.   Pain score without treatment: 9/10   Pain score with treatment: 1/10  Response to treatment plan: stable.   Patient has negative FH but + history of personal use of marijuana in the past.  Pt denies recent use.   Pt is also seeing Podiatrist for L heel spur and has had steroid injections and pain is worsening.  Pt is having surgery by Dr. Kateri Mc on 12/27/12.  Pt also is in consultation for gastric bypass due to failure to lose wt with diet and exercise.    Allergies   Allergen Reactions   ??? Niaspan (Niacin) Rash   ??? Aspirin Nausea Only   ??? Ibuprofen Swelling     Past Medical History   Diagnosis Date   ??? Asthma    ??? GERD (gastroesophageal reflux disease)    ??? Hypertension    ??? Psychotic disorder      bipolar   ??? Cyst      excision of a chronic abscess of left axilla   ??? Diabetes    ??? Arthritis      Past Surgical History   Procedure Laterality Date   ??? Hx tonsillectomy     ??? Pr abdomen surgery proc unlisted     ??? Hx gyn  2004     hysterectomy   ??? Hx wisdom teeth extraction       Family History   Problem Relation Age of Onset   ??? Diabetes Mother    ??? Hypertension Mother    ??? Stroke Mother    ??? Cancer Mother      kidney   ??? Cancer Father      colon   ??? Stroke Father    ??? Depression Sister    ??? Depression Brother      History   Substance Use Topics   ??? Smoking status: Light Tobacco Smoker -- 0.50 packs/day for 30 years     Types: Cigarettes     Last Attempt to Quit: 06/21/2011   ??? Smokeless tobacco: Never Used   ??? Alcohol Use: Yes      Comment: socially        Review of Systems  A comprehensive review of systems was negative except for that written in the HPI.     Objective:    The patient appears well, alert, oriented x 3, in no distress. Morbidly obese BF  BP 120/80   Pulse 104   Temp(Src) 96.1 ??F (35.6 ??C) (Oral)   Resp 18   Ht 5\' 2"  (1.575 m)   Wt 246 lb 6.4 oz (111.766 kg)   BMI 45.06 kg/m2   SpO2 99%   HEENT: WNL  Neck: Supple without LA, TM, or bruits  Chest: Lungs + rhonchi but no wheezes or rales.   CV: S1 and S2 normal, no murmurs, rubs or gallops, regular rate and rhythm.   Abdomen: soft without tenderness, guarding, mass or organomegaly.   Musculoskeletal:  Back: + lower lumbar and para lumbar tenderness with limited ROM.   Extremities: show no edema,  Mild decreased peripheral pulses. + L heel ventral tenderness at plantar fascia insertion  Neurological  is normal without focal findings.   Functional Assessment: Patient is ambulatory and accomplishing tasks of IADL's and ADL's    Impression/Plan  1. DM w/o Complication Type II  METABOLIC PANEL, COMPREHENSIVE, URINALYSIS W/ RFLX MICROSCOPIC, HEMOGLOBIN A1C, MICROALBUMIN, RANDOM URINE   2. Essential hypertension, benign  METABOLIC PANEL, COMPREHENSIVE, URINALYSIS W/ RFLX MICROSCOPIC, cloNIDine (CATAPRESS) 0.2 mg tablet   3. Mixed hyperlipidemia  CBC W/O DIFF, LIPID PANEL, METABOLIC PANEL, COMPREHENSIVE, TSH, 3RD GENERATION   4. Foot pain, left  HYDROcodone-acetaminophen (LORTAB) 5-500 mg per tablet   5. Heel spur     6. Morbid obesity     7. Unspecified vitamin D deficiency  VITAMIN D, 25 HYDROXY   8. Encounter for long-term (current) use of other medications  PAIN MGMT PANEL W/ REFX, UR   9. Lumbar spondylosis  PAIN MGMT PANEL W/ REFX, UR, HYDROcodone-acetaminophen (LORTAB) 5-500 mg per tablet       Pain contract in place.  PMP reviewed  Last Random Drug screening: today  Side effects and use of medications discussed and patient understands and agrees with plan.  Pt is achieving goals of treatment such as ambulating and able to care for self with minimal pain at this time on medication.  Pt's pain levels are at  goal on medications - between 1-2/10  Pt referred to pain management for further evaluation/pain management.

## 2012-11-29 NOTE — Telephone Encounter (Signed)
Left message for patient to return call to office. Have to ask questions regarding insurance.

## 2012-12-06 LAB — METABOLIC PANEL, COMPREHENSIVE
A-G Ratio: 1.2 (ref 1.1–2.5)
ALT (SGPT): 26 IU/L (ref 0–32)
AST (SGOT): 18 IU/L (ref 0–40)
Albumin: 4 g/dL (ref 3.5–5.5)
Alk. phosphatase: 69 IU/L (ref 39–117)
BUN/Creatinine ratio: 22 (ref 9–23)
BUN: 19 mg/dL (ref 6–24)
Bilirubin, total: 0.2 mg/dL (ref 0.0–1.2)
CO2: 24 mmol/L (ref 19–28)
Calcium: 9 mg/dL (ref 8.7–10.2)
Chloride: 102 mmol/L (ref 97–108)
Creatinine: 0.88 mg/dL (ref 0.57–1.00)
GFR est AA: 90 mL/min/{1.73_m2} (ref >59)
GFR est non-AA: 78 mL/min/{1.73_m2} (ref >59)
GLOBULIN, TOTAL: 3.4 g/dL (ref 1.5–4.5)
Glucose: 146 mg/dL — ABNORMAL HIGH (ref 65–99)
Potassium: 4.5 mmol/L (ref 3.5–5.2)
Protein, total: 7.4 g/dL (ref 6.0–8.5)
Sodium: 140 mmol/L (ref 134–144)

## 2012-12-06 LAB — URINALYSIS W/ RFLX MICROSCOPIC
Bilirubin: NEGATIVE
Blood: NEGATIVE
Glucose: NEGATIVE
Ketone: NEGATIVE
Leukocyte Esterase: NEGATIVE
Nitrites: NEGATIVE
Specific Gravity: 1.028 (ref 1.005–1.030)
Urobilinogen: 0.2 mg/dL (ref 0.0–1.9)
pH (UA): 5.5 (ref 5.0–7.5)

## 2012-12-06 LAB — CBC W/O DIFF
HCT: 40.3 % (ref 34.0–46.6)
HGB: 12.4 g/dL (ref 11.1–15.9)
MCH: 28.4 pg (ref 26.6–33.0)
MCHC: 30.8 g/dL — ABNORMAL LOW (ref 31.5–35.7)
MCV: 92 fL (ref 79–97)
PLATELET: 214 10*3/uL (ref 155–379)
RBC: 4.36 x10E6/uL (ref 3.77–5.28)
RDW: 14.2 % (ref 12.3–15.4)
WBC: 7.5 10*3/uL (ref 3.4–10.8)

## 2012-12-06 LAB — PAIN MGMT PANEL, URINE W/RFLX CONFIRM
Amphetamine Screen, urine: NEGATIVE ng/mL
Barbiturates Screen, urine: NEGATIVE ng/mL
Benzodiazepines Screen, urine: NEGATIVE ng/mL
Cannabinoid Screen, urine: NEGATIVE ng/mL
Cocaine Metab. Screen, urine: NEGATIVE ng/mL
Creatinine, urine: 153.1 mg/dL (ref 20.0–300.0)
Fentanyl, Urine: NEGATIVE pg/mL
Meperidine Screen, urine: NEGATIVE ng/mL
Methadone Screen, urine: NEGATIVE ng/mL
Opiate Screen, urine: NEGATIVE ng/mL
Oxycodone/Oxymorphone, urine: POSITIVE — AB
Phencyclidine Screen, urine: NEGATIVE ng/mL
Propoxyphene Screen, urine: NEGATIVE ng/mL
Specific Gravity: 1.02
Tramadol Screen, urine: NEGATIVE ng/mL
pH, urine: 5.4 (ref 4.5–8.9)

## 2012-12-06 LAB — TSH 3RD GENERATION: TSH: 3.28 u[IU]/mL (ref 0.450–4.500)

## 2012-12-06 LAB — MICROSCOPIC EXAMINATION: WBC: NONE SEEN /hpf (ref 0–?)

## 2012-12-06 LAB — VITAMIN D, 25 HYDROXY: VITAMIN D, 25-HYDROXY: 20.4 ng/mL — ABNORMAL LOW (ref 30.0–100.0)

## 2012-12-06 LAB — LIPID PANEL
Cholesterol, total: 221 mg/dL — ABNORMAL HIGH (ref 100–199)
HDL Cholesterol: 29 mg/dL — ABNORMAL LOW (ref >39)
LDL, calculated: 124 mg/dL — ABNORMAL HIGH (ref 0–99)
Triglyceride: 339 mg/dL — ABNORMAL HIGH (ref 0–149)
VLDL, calculated: 68 mg/dL — ABNORMAL HIGH (ref 5–40)

## 2012-12-06 LAB — HEMOGLOBIN A1C WITH EAG: Hemoglobin A1c: 7.6 % — ABNORMAL HIGH (ref 4.8–5.6)

## 2012-12-06 LAB — MICROALBUMIN, RANDOM URINE: Microalbumin, urine: 210.7 ug/mL — ABNORMAL HIGH (ref 0.0–17.0)

## 2012-12-19 NOTE — Progress Notes (Signed)
Quick Note:    Labs stable/normal except elevated cholesterol and low vitamin D level and elevated BS/HbA1C. Will discuss at next office visit.  ______

## 2012-12-20 NOTE — Progress Notes (Signed)
Quick Note:    Letter mailed to patient  ______

## 2012-12-21 HISTORY — PX: FOOT SURGERY: SHX648

## 2012-12-22 ENCOUNTER — Encounter

## 2012-12-26 NOTE — Progress Notes (Signed)
Patient here for follow up.  Med reconciliation correct and up to date with changes made with patient at this time.  Reviewed record in preparation for visit and have obtained necessary documentation.  Chief Complaint   Patient presents with   ??? Follow-up

## 2012-12-26 NOTE — Progress Notes (Signed)
Subjective:  Patient presents for follow up of chronic illnesses with DM and HTN and with spondylosis, primarily affecting the back. Pain is described as aching, severe and is intermittent.    Related to injury: no.  Associated symptoms include: decreased ROM.    Response to treatment plan: stable.   Pt is also seeing Podiatrist for L heel spur and is having surgery tommorrow.  Pt's BS's are still over 200 fasting but patient has not been compliant with diabetic diet.    Allergies   Allergen Reactions   ??? Niaspan (Niacin) Rash   ??? Aspirin Nausea Only   ??? Ibuprofen Swelling     Past Medical History   Diagnosis Date   ??? Asthma    ??? GERD (gastroesophageal reflux disease)    ??? Hypertension    ??? Psychotic disorder      bipolar   ??? Cyst      excision of a chronic abscess of left axilla   ??? Diabetes    ??? Arthritis      Past Surgical History   Procedure Laterality Date   ??? Hx tonsillectomy     ??? Pr abdomen surgery proc unlisted     ??? Hx gyn  2004     hysterectomy   ??? Hx wisdom teeth extraction       Family History   Problem Relation Age of Onset   ??? Diabetes Mother    ??? Hypertension Mother    ??? Stroke Mother    ??? Cancer Mother      kidney   ??? Cancer Father      colon   ??? Stroke Father    ??? Depression Sister    ??? Depression Brother      History   Substance Use Topics   ??? Smoking status: Light Tobacco Smoker -- 0.50 packs/day for 30 years     Types: Cigarettes     Last Attempt to Quit: 06/21/2011   ??? Smokeless tobacco: Never Used   ??? Alcohol Use: Yes      Comment: socially        Review of Systems  A comprehensive review of systems was negative except for that written in the HPI.    Objective:    The patient appears well, alert, oriented x 3, in no distress. Morbidly obese BF  BP 132/100   Pulse 95   Temp(Src) 97.5 ??F (36.4 ??C) (Oral)   Resp 17   Ht 5\' 2"  (1.575 m)   Wt 245 lb 9.6 oz (111.403 kg)   BMI 44.91 kg/m2   SpO2 100%   HEENT: WNL  Neck: Supple without LA, TM, or bruits  Chest: Lungs + rhonchi but no wheezes or rales.    CV: S1 and S2 normal, no murmurs, rubs or gallops, regular rate and rhythm.   Abdomen: soft without tenderness, guarding, mass or organomegaly.   Musculoskeletal:  Back: mild  lower lumbar and para lumbar tenderness with limited ROM.   Extremities: show no edema,  Mild decreased peripheral pulses. + L heel ventral tenderness at plantar fascia insertion  Neurological is normal without focal findings.   Functional Assessment: Patient is ambulatory and accomplishing tasks of IADL's and ADL's    Impression/Plan  1. Mixed hyperlipidemia  simvastatin (ZOCOR) 40 mg tablet   2. Essential hypertension, benign  chlorthalidone (HYGROTEN) 25 mg tablet   3. DM w/o Complication Type II     4. Morbid obesity     5. Type II  or unspecified type diabetes mellitus without mention of complication, uncontrolled  insulin detemir (LEVEMIR FLEXPEN) 100 unit/mL (3 mL) pen       Chronic illnesses are stable on current regimen.  Diet and exercise discussed.        Continue current medications except increase dose of insulin and add Chlorthalidone.        Reviewed plan of care, patient has provided input and agrees with goals        Patient expressed understanding with the diagnosis and plan.  Pt does not smoke.   Follow up in 1-2 months.

## 2012-12-26 NOTE — Patient Instructions (Signed)
Diabetes Diet Guidelines: After Your Visit  Your Care Instructions  A healthy diet is important to manage diabetes. It helps keep your blood sugar at a target level (which you set with your doctor). A diet for diabetes does not mean that you have to eat special foods. You can eat what your family eats, including sweets once in a while. But you do have to pay attention to how often you eat and how much you eat of certain foods.  Managing the amount of carbohydrate you eat is an important part of a healthy diet for diabetes. Carbohydrate is found in sugar and sweets, grains, fruit, starchy vegetables, and milk and yogurt.  You may want to work with a dietitian or a certified diabetes educator (CDE) to help you plan meals and snacks. A dietitian or CDE can also help you lose weight if that is one of your goals.  Follow-up care is a key part of your treatment and safety. Be sure to make and go to all appointments, and call your doctor if you are having problems. It???s also a good idea to know your test results and keep a list of the medicines you take.  How can you care for yourself at home?  ?? Learn which foods have carbohydrate.  ?? Bread, cereal, pasta, and rice have about 15 grams of carbohydrate in a serving. A serving is 1 slice of bread (1 ounce), ?? cup of cooked cereal, or 1/3 cup of cooked pasta or rice.  ?? Fruits have 15 grams of carbohydrate in a serving. A serving is 1 small fresh fruit, such as an apple or orange; ?? of a banana; ?? cup of cooked or canned fruit; ?? cup of fruit juice; 1 cup of melon or raspberries; or 2 tablespoons of dried fruit.  ?? Milk and no-sugar-added yogurt have 15 grams of carbohydrate in a serving. A serving is 1 cup of milk or 2/3 cup of no-sugar-added yogurt.  ?? Starchy vegetables have 15 grams of carbohydrate in a serving. A serving is ?? cup of mashed potatoes or sweet potato; 1 cup winter squash; ?? of a small baked potato; 1/4 cup of cooked dried beans; or ?? cup cooked corn or  green peas.  ?? Learn how much carbohydrate to eat each day. A dietitian or CDE can teach you how to keep track of the amount of carbohydrate you eat. This is called carbohydrate counting.  ?? Try to eat about the same amount of carbohydrate at each meal. Your dietitian or CDE can tell you how much carbohydrate to eat at each meal and snack. Do not "save up" your daily allowance of carbohydrate to eat at one meal.  ?? If you are not sure how to count carbohydrate grams, use the Plate Method to plan meals. It is a good, quick way to make sure that you have a balanced meal. It also helps you spread carbohydrate throughout the day. Divide your plate by types of foods. Put vegetables on half the plate, meat or other protein food on one-quarter of the plate, and a grain or starchy vegetable (such as brown rice or a potato) in the final quarter of the plate. To this you can add a small piece of fruit and 1 cup of milk or yogurt, depending on how much carbohydrate you are supposed to eat at a meal.  ?? Limit saturated fat, such as the fat from meat and dairy products. Choose lean cuts of meat and nonfat or   low-fat dairy products. Use olive or canola oil instead of butter or shortening when cooking. This is a healthy choice because people who have diabetes are at higher-than-average risk of heart disease.  ?? Do not skip meals. Your blood sugar may drop too low if you skip meals and take certain diabetes pills or insulin.  ?? Work with your doctor to write up a sick-day plan for what to do on days when you are sick. Your blood sugar can go up or down depending on your illness and whether you can keep food down. Call your doctor when you are sick, to see if you need to adjust your pills or insulin.  ?? Check with your doctor before you drink alcohol. Alcohol can cause your blood sugar to drop too low. Alcohol can also cause a bad reaction if you take certain diabetes pills.    Where can you learn more?    Go to  http://www.healthwise.net/[facility]   Enter I147 in the search box to learn more about "Diabetes Diet Guidelines: After Your Visit."    ?? 2006-2013 [employer]. Care instructions adapted under license by [facility] (which disclaims liability or warranty for this information). This care instruction is for use with your licensed healthcare professional. If you have questions about a medical condition or this instruction, always ask your healthcare professional. Healthwise, Incorporated disclaims any warranty or liability for your use of this information.  Content Version: 9.9.209917; Last Revised: December 01, 2010

## 2013-03-16 ENCOUNTER — Encounter

## 2013-03-17 NOTE — Telephone Encounter (Signed)
Called pt medication in to pharmacy.

## 2013-03-17 NOTE — Telephone Encounter (Signed)
Called pt and advised rx called and that a follow up appt is needed. Pt stated she would have been in this week or next week but is waiting for Dr Pollie Friar to return.

## 2013-03-17 NOTE — Telephone Encounter (Signed)
Patient is going out of town and is asking if at all possible can this be filled today.

## 2013-03-17 NOTE — Telephone Encounter (Signed)
Please call in Xanax refill.  Will need follow up visit

## 2013-04-26 NOTE — Progress Notes (Signed)
"  Reviewed record in preparation for visit and have obtained the necessary documentation"   She went to [facility] two weeks ago and her sugar was good -   Learning assessment done  Abuse Screening Questionnaire 04/26/2013   Do you ever feel afraid of your partner? N   Are you in a relationship with someone who physically or mentally threatens you? N   Is it safe for you to go home? Y      Medication reconciliation up to date and corrected with patient at this time.  She has lots of medicine she is not taking because of how they make her feel.  She will come back for her diabetic check.  Chief Complaint   Patient presents with   ??? Medication Refill

## 2013-04-26 NOTE — Progress Notes (Signed)
Subjective:     Mary Mayo is a 47 y.o. female seen for follow up of diabetes.   She also has hypertension, hyperlipidemia, obesity and Bipolar disorder.    Diabetic Review of Systems - medication compliance: noncompliant some of the time, diabetic diet compliance: noncompliant some of the time, home glucose monitoring: is performed sporadically, fasting values range 130's.    Other symptoms and concerns: she stopped taking some medicines due to side effects.  Stopped Metformin caused diarrhea. Simvastatin made her feel bad. Diovan made her sleepy.    Patient Active Problem List    Diagnosis Date Noted   ??? Tobacco use disorder 04/29/2012   ??? Unspecified vitamin D deficiency 03/31/2012   ??? Lumbar spondylosis 06/01/2011   ??? Microalbuminuria 04/30/2011   ??? Encounter for long-term (current) use of other medications 04/01/2011   ??? Hidradenitis suppurativa 12/04/2010   ??? Dysthymic disorder 08/06/2010   ??? Mixed hyperlipidemia 01/10/2010   ??? Mgrn w aura wo ntrc mgrn 11/12/2009   ??? Allergic rhinitis due to other allergen 10/11/2009   ??? Cyst    ??? Morbid obesity 07/05/2009   ??? Unspecified sleep apnea 06/14/2009   ??? Unspecified Tachycardia 03/04/2009   ??? DM w/o Complication Type II 02/04/2009   ??? Urge Incontinence 10/31/2008   ??? Esophageal Reflux 07/11/2008   ??? Lumbago 07/11/2008   ??? Essential Hypertension, Benign 07/11/2008   ??? Bipolar Affective, Depress, Unspec 07/11/2008     Current Outpatient Prescriptions   Medication Sig Dispense Refill   ??? ALPRAZolam (XANAX) 1 mg tablet take 1 tablet by mouth twice a day  60 Tab  0   ??? insulin detemir (LEVEMIR FLEXPEN) 100 unit/mL (3 mL) pen 65 Units by SubCUTAneous route two (2) times a day.  10 Each  3   ??? insulin lispro (HUMALOG KWIKPEN) 100 unit/mL kwikpen 22 units subcutaneous before meals three times a day.  5 Each  5   ??? traZODone (DESYREL) 300 mg tablet Take 1 Tab by mouth nightly.  30 Tab  5   ??? pantoprazole (PROTONIX) 40 mg tablet Take 1 Tab by mouth daily.  30 Tab  5   ???  pravastatin (PRAVACHOL) 20 mg tablet Take 1 Tab by mouth nightly.  30 Tab  5   ??? HYDROcodone-acetaminophen (NORCO) 5-325 mg per tablet Take 1 Tab by mouth every eight (8) hours as needed for Pain.  30 Tab  0   ??? fenofibrate (TRICOR) 160 mg tablet Take 1 Tab by mouth daily. Take with food.  30 Tab  5   ??? metFORMIN ER (GLUCOPHAGE XR) 500 mg tablet Take 2 Tabs by mouth two (2) times daily (after meals).  120 Tab  5   ??? [START ON 04/28/2013] ergocalciferol (VITAMIN D2) 50,000 unit capsule Take 1 Cap by mouth two (2) times a week.  9 Cap  5   ??? losartan (COZAAR) 50 mg tablet Take 1 Tab by mouth daily.  30 Tab  5   ??? nicotine (NICODERM CQ) 21 mg/24 hr 1 Patch by TransDERmal route every twenty-four (24) hours for 30 days.  30 Patch  1   ??? cloNIDine (CATAPRESS) 0.2 mg tablet Take 1 Tab by mouth two (2) times a day.  60 Tab  11   ??? naproxen (NAPROSYN) 500 mg tablet Take 1 Tab by mouth two (2) times daily (with meals).  60 Tab  2   ??? Insulin Needles, Disposable, (NOVOFINE 32) 32 x 1/4 " Ndle 1 Each by  Does Not Apply route four (4) times daily.  100 Each  5   ??? Blood-Glucose Meter monitoring kit by Does Not Apply route. Use as directed    1 Kit  0   ??? Lancets & Blood Glucose Strips Cmpk 1 Units by Does Not Apply route as directed. Monitor BS BID Dx: 250.02  100 Units  5   ??? fluticasone (FLONASE) 50 mcg/actuation nasal spray 2 Sprays by Both Nostrils route daily.  1 Bottle  5     Allergies   Allergen Reactions   ??? Niaspan (Niacin) Rash   ??? Aspirin Nausea Only   ??? Ibuprofen Swelling             Review of Systems  Pertinent items are noted in HPI.  She has chronic back pain.    Objective:     BP 141/92   Pulse 96   Temp(Src) 97.7 ??F (36.5 ??C) (Oral)   Resp 20   Ht 5\' 2"  (1.575 m)   Wt 232 lb (105.235 kg)   BMI 42.42 kg/m2   SpO2 99%  Appearance: alert, well appearing, and in no distress and overweight.  Exam: heart sounds normal rate, regular rhythm, normal S1, S2, no murmurs, rubs, clicks or gallops, chest clear  Lab review:  orders written for new lab studies as appropriate; see orders.    Assessment/Plan:     diabetes , hypertension , hyperlipidemia .  Diabetic issues reviewed with her: long term diabetic complications discussed.     ICD-9-CM    1. Type II or unspecified type diabetes mellitus without mention of complication, uncontrolled 250.02 insulin detemir (LEVEMIR FLEXPEN) 100 unit/mL (3 mL) pen     insulin lispro (HUMALOG KWIKPEN) 100 unit/mL kwikpen     metFORMIN ER (GLUCOPHAGE XR) 500 mg tablet     losartan (COZAAR) 50 mg tablet     HEMOGLOBIN A1C     MICROALBUMIN:CREATININE RATIO, RANDOM URINE   2. Bipolar Affective, Depress, Unspec 296.50 ALPRAZolam (XANAX) 1 mg tablet     traZODone (DESYREL) 300 mg tablet   3. Mixed hyperlipidemia 272.2 pravastatin (PRAVACHOL) 20 mg tablet     fenofibrate (TRICOR) 160 mg tablet     LIPID PANEL   4. Essential hypertension, benign 401.1 losartan (COZAAR) 50 mg tablet     TSH, 3RD GENERATION   5. Foot pain, left 729.5    6. Lumbar spondylosis 721.3 HYDROcodone-acetaminophen (NORCO) 5-325 mg per tablet   7. Esophageal reflux 530.81 pantoprazole (PROTONIX) 40 mg tablet   8. Unspecified vitamin D deficiency 268.9 ergocalciferol (VITAMIN D2) 50,000 unit capsule     VITAMIN D, 25 HYDROXY   9. Encounter for long-term (current) use of medications V58.69 CBC W/O DIFF     METABOLIC PANEL, COMPREHENSIVE     PAIN MGMT PANEL W/ REFX, UR   10. Nicotine dependence 305.1 nicotine (NICODERM CQ) 21 mg/24 hr   11. Mucocele of lip 528.5 REFERRAL TO ENT-OTOLARYNGOLOGY   12. High risk heterosexual behavior V69.2 HIV 1/2 AB, BY IMMUNOBLOT     HEPATITIS PANEL, ACUTE     RPR     She will return for fasting labs.  Explained the importance of taking statin and ARB in setting diabetes. Will switch to pravastatin and losartan.  Switch to Metformin ER.  She requested STD test be included with labs.

## 2013-05-01 LAB — PAIN MGMT PANEL, URINE W/RFLX CONFIRM
Amphetamine Screen, urine: NEGATIVE ng/mL
Barbiturates Screen, urine: NEGATIVE ng/mL
Benzodiazepines Screen, urine: NEGATIVE ng/mL
Cannabinoid Screen, urine: POSITIVE — AB
Cocaine Metab. Screen, urine: NEGATIVE ng/mL
Creatinine, urine: 143.5 mg/dL (ref 20.0–300.0)
Fentanyl, Urine: NEGATIVE pg/mL
Meperidine Screen, urine: NEGATIVE ng/mL
Methadone Screen, urine: NEGATIVE ng/mL
Opiate Screen, urine: NEGATIVE ng/mL
Oxycodone/Oxymorphone, urine: NEGATIVE ng/mL
Phencyclidine Screen, urine: NEGATIVE ng/mL
Propoxyphene Screen, urine: NEGATIVE ng/mL
Specific Gravity: 1.021
Tramadol Screen, urine: NEGATIVE ng/mL
pH, urine: 5.5 (ref 4.5–8.9)

## 2013-05-26 ENCOUNTER — Encounter

## 2013-05-26 NOTE — Telephone Encounter (Signed)
Patient would like for medication to be sent to pharmacy today if possible. Patient states that she is going out of town this evening. [phone]

## 2013-05-26 NOTE — Telephone Encounter (Signed)
Last seen on 04/26/13. Last written on 04/26/13. Thanks

## 2013-05-29 NOTE — Telephone Encounter (Signed)
Called in

## 2013-05-29 NOTE — Telephone Encounter (Signed)
Call in script

## 2013-06-19 ENCOUNTER — Encounter

## 2013-06-19 NOTE — Telephone Encounter (Signed)
lov 04/26/2013 lrf 05/26/2013 patient message is posted below. Thanks  Patient requests refill as soon as possible. Patient states that she is going out of town.

## 2013-06-19 NOTE — Telephone Encounter (Signed)
Patient requests refill as soon as possible. Patient states that she is going out of town.

## 2013-06-21 NOTE — Telephone Encounter (Signed)
Last seen on 04/26/13. Last written on 05/26/13. Thanks

## 2013-06-21 NOTE — Telephone Encounter (Signed)
If at all possible can this be done today patient is going out of town for the holiday weekend.

## 2013-06-21 NOTE — Telephone Encounter (Signed)
Please cal in Xanax refill.  She NEEDS LABS DONE.  Orders in CC.

## 2013-06-22 NOTE — Telephone Encounter (Signed)
Called in.   Patients phone number is not in service

## 2013-07-24 ENCOUNTER — Encounter

## 2013-07-24 NOTE — Telephone Encounter (Signed)
Request sent to dr cress at this time

## 2013-07-24 NOTE — Telephone Encounter (Signed)
Visit 04/26/13, refill 06/19/13, tested positive for cannabinoid 04/26/13

## 2013-07-24 NOTE — Telephone Encounter (Signed)
Pt failed drug test in May.  Also she never returned for further blood work.  Denied med refill.  Please call pt.

## 2013-07-25 NOTE — Telephone Encounter (Signed)
Attempted to contact patient to inform that refill denied due to failed her drug test and never came back for follow up labs but both numbers listed are no longer working numbers.

## 2013-07-26 NOTE — Telephone Encounter (Signed)
Patient called for status of med refill.  I informed her of below message.  She stated she has an appointment on 8/12.  She is requesting a call back from nurse at [phone].  Phone number changed in [place].  Thank you

## 2013-07-26 NOTE — Telephone Encounter (Signed)
She has a appt already.

## 2013-07-31 ENCOUNTER — Telehealth

## 2013-07-31 NOTE — Telephone Encounter (Signed)
Patient called and stated that the nasal spray that was sent over her insurance will not cover.  Please send over something else.

## 2013-07-31 NOTE — Progress Notes (Signed)
Subjective:   Mary Mayo is a 47 y.o. female who complains of congestion, sore throat, productive cough, headache and green nasal discharge for 4 days, gradually worsening since that time.  She denies a history of fevers and shortness of breath.  Also she is NOT taking a lot of her prescribed medicines for BP and cholesterol.    Evaluation to date: none.   Treatment to date: OTC products.  Patient does smoke cigarettes.  Relevant PMH: DM.    Patient Active Problem List    Diagnosis Date Noted   ??? Tobacco use disorder 04/29/2012   ??? Unspecified vitamin D deficiency 03/31/2012   ??? Lumbar spondylosis 06/01/2011   ??? Microalbuminuria 04/30/2011   ??? Encounter for long-term (current) use of other medications 04/01/2011   ??? Hidradenitis suppurativa 12/04/2010   ??? Dysthymic disorder 08/06/2010   ??? Mixed hyperlipidemia 01/10/2010   ??? Mgrn w aura wo ntrc mgrn 11/12/2009   ??? Allergic rhinitis due to other allergen 10/11/2009   ??? Cyst    ??? Morbid obesity 07/05/2009   ??? Unspecified sleep apnea 06/14/2009   ??? Unspecified Tachycardia 03/04/2009   ??? DM w/o Complication Type II 02/04/2009   ??? Urge Incontinence 10/31/2008   ??? Esophageal Reflux 07/11/2008   ??? Lumbago 07/11/2008   ??? Essential Hypertension, Benign 07/11/2008   ??? Bipolar Affective, Depress, Unspec 07/11/2008     Current Outpatient Prescriptions   Medication Sig Dispense Refill   ??? amoxicillin-clavulanate (AUGMENTIN) 875-125 mg per tablet Take 1 Tab by mouth two (2) times a day for 10 days.  20 Tab  0   ??? promethazine-codeine (PHENERGAN-CODEINE) 6.25-10 mg/5 mL syrup Take 5 mL by mouth every six (6) hours as needed for Cough.  200 mL  0   ??? ALPRAZolam (XANAX) 1 mg tablet take 1 tablet by mouth twice a day  60 Tab  0   ??? insulin detemir (LEVEMIR FLEXPEN) 100 unit/mL (3 mL) pen 65 Units by SubCUTAneous route two (2) times a day.  10 Each  3   ??? insulin lispro (HUMALOG KWIKPEN) 100 unit/mL kwikpen 22 units subcutaneous before meals three times a day.  5 Each  5   ???  traZODone (DESYREL) 300 mg tablet Take 1 Tab by mouth nightly.  30 Tab  5   ??? pantoprazole (PROTONIX) 40 mg tablet Take 1 Tab by mouth daily.  30 Tab  5   ??? metFORMIN ER (GLUCOPHAGE XR) 500 mg tablet Take 2 Tabs by mouth two (2) times daily (after meals).  120 Tab  5   ??? cloNIDine (CATAPRESS) 0.2 mg tablet Take 1 Tab by mouth two (2) times a day.  60 Tab  11   ??? Insulin Needles, Disposable, (NOVOFINE 32) 32 x 1/4 " Ndle 1 Each by Does Not Apply route four (4) times daily.  100 Each  5   ??? Blood-Glucose Meter monitoring kit by Does Not Apply route. Use as directed    1 Kit  0   ??? Lancets & Blood Glucose Strips Cmpk 1 Units by Does Not Apply route as directed. Monitor BS BID Dx: 250.02  100 Units  5   ??? triamcinolone (NASACORT AQ) 55 mcg nasal inhaler insert 2 sprays into each nostril daily  17 g  3   ??? pravastatin (PRAVACHOL) 20 mg tablet Take 1 Tab by mouth nightly.  30 Tab  5   ??? fenofibrate (TRICOR) 160 mg tablet Take 1 Tab by mouth daily. Take with food.  30 Tab  5   ??? ergocalciferol (VITAMIN D2) 50,000 unit capsule Take 1 Cap by mouth two (2) times a week.  9 Cap  5   ??? losartan (COZAAR) 50 mg tablet Take 1 Tab by mouth daily.  30 Tab  5   ??? naproxen (NAPROSYN) 500 mg tablet Take 1 Tab by mouth two (2) times daily (with meals).  60 Tab  2     Allergies   Allergen Reactions   ??? Niaspan (Niacin) Rash   ??? Aspirin Nausea Only   ??? Ibuprofen Swelling     Past Medical History   Diagnosis Date   ??? Asthma    ??? GERD (gastroesophageal reflux disease)    ??? Hypertension    ??? Psychotic disorder      bipolar   ??? Cyst      excision of a chronic abscess of left axilla   ??? Diabetes    ??? Arthritis      Past Surgical History   Procedure Laterality Date   ??? Hx tonsillectomy     ??? Pr abdomen surgery proc unlisted     ??? Hx gyn  2004     hysterectomy   ??? Hx wisdom teeth extraction       Family History   Problem Relation Age of Onset   ??? Diabetes Mother    ??? Hypertension Mother    ??? Stroke Mother    ??? Cancer Mother      kidney   ???  Cancer Father      colon   ??? Stroke Father    ??? Depression Sister    ??? Depression Brother      History   Substance Use Topics   ??? Smoking status: Light Tobacco Smoker -- 0.50 packs/day for 30 years     Types: Cigarettes     Last Attempt to Quit: 06/21/2011   ??? Smokeless tobacco: Never Used   ??? Alcohol Use: Yes      Comment: socially        Review of Systems  Pertinent items are noted in HPI.  C/O knots on her lower legs. Non tender.  Objective:     BP 153/113   Pulse 98   Temp(Src) 97 ??F (36.1 ??C) (Oral)   Resp 20   Ht 5\' 2"  (1.575 m)   Wt 226 lb (102.513 kg)   BMI 41.33 kg/m2   SpO2 96%  General:  alert, cooperative, no distress   Eyes: negative   Ears: normal TM's and external ear canals AU   Sinuses: Normal paranasal sinuses without tenderness   Mouth:  Lips, mucosa, and tongue normal. Teeth and gums normal   Neck: supple, symmetrical, trachea midline and no adenopathy.   Heart: S1 and S2 normal, no murmurs noted.    Lungs: clear to auscultation bilaterally   Abdomen:         Assessment/Plan:   sinusitis  Suggested symptomatic OTC remedies.  Antibiotics per orders.  RTC prn.    ICD-9-CM    1. Acute sinusitis 461.9 amoxicillin-clavulanate (AUGMENTIN) 875-125 mg per tablet     promethazine-codeine (PHENERGAN-CODEINE) 6.25-10 mg/5 mL syrup     DISCONTINUED: budesonide (RHINOCORT AQUA) 32 mcg/actuation nasal spray   2. Bipolar Affective, Depress, Unspec 296.50 ALPRAZolam (XANAX) 1 mg tablet   3. Varicose veins 454.9    4. Medical non-compliance V15.81    5. Poor hypertension control 401.9    .  I explained importance of taking her medicines  as prescribed to prevent sequela of chronic illnesses:  HTN, DM, high cholesterol.  Refer to Vascular surgery for varicose veins.  Med refill.

## 2013-07-31 NOTE — Progress Notes (Signed)
"  Reviewed record in preparation for visit and have obtained the necessary documentation"  Medication reconciliation up to date and corrected with patient at this time.  She is not taking half her meds. She wants refills.  Chief Complaint   Patient presents with   ??? Medication Refill   ??? Sinus Infection     started friday

## 2013-07-31 NOTE — Telephone Encounter (Signed)
Switched to Nasacort AQ

## 2013-08-01 ENCOUNTER — Telehealth

## 2013-08-01 LAB — MICROALBUMIN:CREATININE RATIO, RANDOM URINE
Creatinine, urine random: 206.9 mg/dL (ref 15.0–278.0)
Microalb/Creat ratio (ug/mg creat.): 309.4 mg/g creat — ABNORMAL HIGH (ref 0.0–30.0)
Microalbumin, urine: 640.2 ug/mL — ABNORMAL HIGH (ref 0.0–17.0)

## 2013-08-01 NOTE — Telephone Encounter (Signed)
Patient called and stated that the only nasal spray that her insurance will only cover flonase.  Please call this into her pharmacy on file.

## 2013-08-02 LAB — HIV 1/2 AB, BY IMMUNOBLOT
HIV 1/O/2 Abs, QL: NONREACTIVE
HIV 1/O/2 Abs, Qual: NONREACTIVE
HIV 1/O/2 Abs-Index Value: <1 (ref <1.00)
HIV 1/O/2 Abs: <1 (ref <1.00)

## 2013-08-02 LAB — RPR
RPR: NONREACTIVE
RPR: NONREACTIVE

## 2013-08-02 LAB — METABOLIC PANEL, COMPREHENSIVE
A-G Ratio: 1.4 (ref 1.1–2.5)
ALT (SGPT): 28 IU/L (ref 0–32)
AST (SGOT): 22 IU/L (ref 0–40)
Albumin: 4.2 g/dL (ref 3.5–5.5)
Alk. phosphatase: 67 IU/L (ref 39–117)
BUN/Creatinine ratio: 14 (ref 9–23)
BUN: 10 mg/dL (ref 6–24)
Bilirubin, total: 0.3 mg/dL (ref 0.0–1.2)
CO2: 25 mmol/L (ref 18–29)
Calcium: 9.5 mg/dL (ref 8.7–10.2)
Chloride: 99 mmol/L (ref 97–108)
Creatinine: 0.7 mg/dL (ref 0.57–1.00)
GFR est AA: 118 mL/min/{1.73_m2} (ref >59)
GFR est non-AA: 102 mL/min/{1.73_m2} (ref >59)
GLOBULIN, TOTAL: 3.1 g/dL (ref 1.5–4.5)
Glucose: 169 mg/dL — ABNORMAL HIGH (ref 65–99)
Potassium: 4.1 mmol/L (ref 3.5–5.2)
Protein, total: 7.3 g/dL (ref 6.0–8.5)
Sodium: 138 mmol/L (ref 134–144)

## 2013-08-02 LAB — CBC W/O DIFF
HCT: 41.1 % (ref 34.0–46.6)
HGB: 13.2 g/dL (ref 11.1–15.9)
MCH: 28.9 pg (ref 26.6–33.0)
MCHC: 32.1 g/dL (ref 31.5–35.7)
MCV: 90 fL (ref 79–97)
PLATELET: 188 10*3/uL (ref 150–379)
RBC: 4.56 x10E6/uL (ref 3.77–5.28)
RDW: 13.8 % (ref 12.3–15.4)
WBC: 4.5 10*3/uL (ref 3.4–10.8)

## 2013-08-02 LAB — CVD REPORT: PDF IMAGE: 0

## 2013-08-02 LAB — LIPID PANEL
Cholesterol, total: 242 mg/dL — ABNORMAL HIGH (ref 100–199)
HDL Cholesterol: 28 mg/dL — ABNORMAL LOW (ref >39)
LDL, calculated: 159 mg/dL — ABNORMAL HIGH (ref 0–99)
Triglyceride: 275 mg/dL — ABNORMAL HIGH (ref 0–149)
VLDL, calculated: 55 mg/dL — ABNORMAL HIGH (ref 5–40)

## 2013-08-02 LAB — VITAMIN D, 25 HYDROXY: VITAMIN D, 25-HYDROXY: 23.9 ng/mL — ABNORMAL LOW (ref 30.0–100.0)

## 2013-08-02 LAB — HEPATITIS PANEL, ACUTE
HEP C VIRUS AB: <0.1 s/co ratio (ref 0.0–0.9)
Hep B Core Ab, IgM: NEGATIVE
Hep B surface Ag screen: NEGATIVE
Hepatitis A Ab, IgM: NEGATIVE

## 2013-08-02 LAB — TSH 3RD GENERATION: TSH: 1.1 u[IU]/mL (ref 0.450–4.500)

## 2013-08-02 LAB — HEMOGLOBIN A1C WITH EAG: Hemoglobin A1c: 7.1 % — ABNORMAL HIGH (ref 4.8–5.6)

## 2013-08-10 NOTE — Progress Notes (Signed)
Quick Note:    Your blood tests show high sugar and very high cholesterol. Please get back to taking pravastatin for cholesterol. Also there is a high microalbumin protein in your urine. This is related to diabetes. Please get back to taking Losartan for blood pressure and kidney protection. Vitamin D is low. Take a daily Vit D supplement of 1,000 units from over the counter. Follow up in 4 months.  ______

## 2013-08-15 ENCOUNTER — Encounter

## 2013-08-28 ENCOUNTER — Encounter

## 2013-08-28 NOTE — Telephone Encounter (Signed)
Free Style Lite   Glucose test strips

## 2013-09-15 ENCOUNTER — Encounter

## 2013-10-31 ENCOUNTER — Encounter

## 2013-11-01 NOTE — Telephone Encounter (Signed)
lov 07/31/2013 lrf 04/26/2013 Thanks

## 2013-12-21 HISTORY — PX: CERVICAL SPINE SURGERY: SHX589

## 2014-01-29 LAB — TYPE AND SCREEN
ABO/Rh: O POS
Antibody Screen: NEGATIVE

## 2014-01-29 LAB — URINALYSIS W/ REFLEX CULTURE
Bacteria: NEGATIVE /hpf
Bilirubin: NEGATIVE
Blood: NEGATIVE
Glucose: NEGATIVE mg/dL
Ketone: NEGATIVE mg/dL
Leukocyte Esterase: NEGATIVE
Nitrites: NEGATIVE
Protein: NEGATIVE mg/dL
Specific gravity: 1.017 (ref 1.003–1.030)
Urobilinogen: 0.2 EU/dL (ref 0.2–1.0)
pH (UA): 5.5 (ref 5.0–8.0)

## 2014-01-29 LAB — CBC WITH AUTOMATED DIFF
ABS. BASOPHILS: 0 10*3/uL (ref 0.0–0.1)
ABS. EOSINOPHILS: 0.1 10*3/uL (ref 0.0–0.4)
ABS. LYMPHOCYTES: 4.2 10*3/uL — ABNORMAL HIGH (ref 0.8–3.5)
ABS. MONOCYTES: 0.4 10*3/uL (ref 0.0–1.0)
ABS. NEUTROPHILS: 2.4 10*3/uL (ref 1.8–8.0)
BASOPHILS: 0 % (ref 0–1)
EOSINOPHILS: 2 % (ref 0–7)
HCT: 39.9 % (ref 35.0–47.0)
HGB: 12.8 g/dL (ref 11.5–16.0)
LYMPHOCYTES: 58 % — ABNORMAL HIGH (ref 12–49)
MCH: 29.2 PG (ref 26.0–34.0)
MCHC: 32.1 g/dL (ref 30.0–36.5)
MCV: 90.9 FL (ref 80.0–99.0)
MONOCYTES: 6 % (ref 5–13)
NEUTROPHILS: 34 % (ref 32–75)
PLATELET: 204 10*3/uL (ref 150–400)
RBC: 4.39 M/uL (ref 3.80–5.20)
RDW: 14.1 % (ref 11.5–14.5)
WBC: 7.2 10*3/uL (ref 3.6–11.0)

## 2014-01-29 LAB — METABOLIC PANEL, COMPREHENSIVE
A-G Ratio: 0.9 — ABNORMAL LOW (ref 1.1–2.2)
ALT (SGPT): 46 U/L (ref 12–78)
AST (SGOT): 23 U/L (ref 15–37)
Albumin: 3.4 g/dL — ABNORMAL LOW (ref 3.5–5.0)
Alk. phosphatase: 69 U/L (ref 45–117)
Anion gap: 8 mmol/L (ref 5–15)
BUN/Creatinine ratio: 22 — ABNORMAL HIGH (ref 12–20)
BUN: 16 MG/DL (ref 6–20)
Bilirubin, total: 0.2 MG/DL (ref 0.2–1.0)
CO2: 27 mmol/L (ref 21–32)
Calcium: 9.2 MG/DL (ref 8.5–10.1)
Chloride: 102 mmol/L (ref 97–108)
Creatinine: 0.73 MG/DL (ref 0.45–1.15)
GFR est AA: >60 mL/min/{1.73_m2} (ref >60)
GFR est non-AA: >60 mL/min/{1.73_m2} (ref >60)
Globulin: 3.9 g/dL (ref 2.0–4.0)
Glucose: 154 mg/dL — ABNORMAL HIGH (ref 65–100)
Potassium: 4.8 mmol/L (ref 3.5–5.1)
Protein, total: 7.3 g/dL (ref 6.4–8.2)
Sodium: 137 mmol/L (ref 136–145)

## 2014-01-29 LAB — PROTHROMBIN TIME + INR
INR: 1.1 (ref 0.9–1.1)
Prothrombin time: 11.4 s — ABNORMAL HIGH (ref 9.0–11.1)

## 2014-01-29 LAB — TYPE & SCREEN
ABO/Rh(D): O POS
Antibody screen: NEGATIVE

## 2014-01-29 LAB — HEMOGLOBIN A1C WITH EAG
Est. average glucose: 154 mg/dL
Hemoglobin A1c: 7 % — ABNORMAL HIGH (ref 4.2–6.3)

## 2014-01-29 LAB — PTT: aPTT: 26.1 s (ref 22.1–30.0)

## 2014-01-29 NOTE — H&P (Addendum)
PAT Pre-Op History & Physical    Patient: Mary Mayo                  MRN: [id number]          SSN: [id number]  Date of Birth: 13-Mar-1966          Age: 48 y.o.             Sex: female                Subjective:   Patient is a 48 y.o. African American female who presents with history of chronic neck pain that radiates down both arms to her fingers for many years with a hx of spinal stenosis. Failed NSAIDs and narcotic pain medication.   The patient was evaluated in surgeon's office and it was determined that the most appropriate plan of care is to proceed with surgical intervention. Patient's PCP Francisca December, MD    She reports having a cough with clear secretions for 2 days, denies fever.     MRI showed   Mild disc degenerative change focus at C5-C6.  Mild broad-based disc protrusion at C5-C6 with mild central canal stenosis,   severe left and mild right foraminal stenosis.      Patient Active Problem List    Diagnosis Date Noted   ??? Tobacco use disorder 04/29/2012   ??? Unspecified vitamin D deficiency 03/31/2012   ??? Lumbar spondylosis 06/01/2011   ??? Microalbuminuria 04/30/2011   ??? Encounter for long-term (current) use of other medications 04/01/2011   ??? Hidradenitis suppurativa 12/04/2010   ??? Dysthymic disorder 08/06/2010   ??? Mixed hyperlipidemia 01/10/2010   ??? Mgrn w aura wo ntrc mgrn 11/12/2009   ??? Allergic rhinitis due to other allergen 10/11/2009   ??? Cyst    ??? Morbid obesity 07/05/2009   ??? Unspecified sleep apnea 06/14/2009   ??? Unspecified Tachycardia 03/04/2009   ??? DM w/o Complication Type II 13/24/4010   ??? Urge Incontinence 10/31/2008   ??? Esophageal Reflux 07/11/2008   ??? Lumbago 07/11/2008   ??? Essential Hypertension, Benign 07/11/2008   ??? Bipolar Affective, Depress, Unspec 07/11/2008     Past Medical History   Diagnosis Date   ??? Hypertension    ??? Psychotic disorder      bipolar   ??? Cyst      excision of a chronic abscess of left axilla   ??? Diabetes      type 2   ??? Arthritis      spinal stenosis     ??? Asthma      last uses inhaler 2004   ??? GERD (gastroesophageal reflux disease)    ??? Dysthymic disorder 08/06/2010   ??? Bipolar Affective, Depress, Unspec 07/11/2008   ??? Ill-defined condition      vitamin d deficiency   ??? Ill-defined condition      season allergies   ??? Ill-defined condition      high cholesterol   ??? Unspecified sleep apnea      last used 5 years ago- discontinued due to weight loss      Past Surgical History   Procedure Laterality Date   ??? Hx wisdom teeth extraction     ??? Hx tonsillectomy     ??? Hx gyn  2004     hysterectomy      Prior to Admission medications    Medication Sig Start Date End Date Taking? Authorizing Provider   oxyCODONE-acetaminophen (PERCOCET) 5-325 mg per  tablet Take 2 Tabs by mouth two (2) times a day.   Yes Historical Provider   metFORMIN ER (GLUCOPHAGE XR) 500 mg tablet Take 2 tablets by mouth two (2) times daily (after meals). 41/66/06  Yes Shelly Coss, MD   fluticasone (FLONASE) 50 mcg/actuation nasal spray 2 Sprays by Both Nostrils route daily. 02/19/59  Yes Shelly Coss, MD   ALPRAZolam Duanne Moron) 1 mg tablet take 1 tablet by mouth twice a day 12/29/30  Yes Shelly Coss, MD   traZODone (DESYREL) 300 mg tablet Take 1 Tab by mouth nightly. 02/23/56  Yes Shelly Coss, MD   pantoprazole (PROTONIX) 40 mg tablet Take 1 Tab by mouth daily. 02/20/19  Yes Shelly Coss, MD   cloNIDine (CATAPRESS) 0.2 mg tablet Take 1 Tab by mouth two (2) times a day. 11/25/12  Yes Francisca December, MD   Blood-Glucose Meter monitoring kit by Does Not Apply route. Use as directed   12/12/09  Yes Francisca December, MD   Lancets & Blood Glucose Strips Cmpk 1 Units by Does Not Apply route as directed. Monitor BS BID Dx: 250.02 12/12/09  Yes Francisca December, MD     Current Outpatient Prescriptions   Medication Sig   ??? oxyCODONE-acetaminophen (PERCOCET) 5-325 mg per tablet Take 2 Tabs by mouth two (2) times a day.   ??? metFORMIN ER (GLUCOPHAGE XR) 500 mg tablet Take 2 tablets by mouth two (2) times daily (after  meals).   ??? fluticasone (FLONASE) 50 mcg/actuation nasal spray 2 Sprays by Both Nostrils route daily.   ??? ALPRAZolam (XANAX) 1 mg tablet take 1 tablet by mouth twice a day   ??? traZODone (DESYREL) 300 mg tablet Take 1 Tab by mouth nightly.   ??? pantoprazole (PROTONIX) 40 mg tablet Take 1 Tab by mouth daily.   ??? cloNIDine (CATAPRESS) 0.2 mg tablet Take 1 Tab by mouth two (2) times a day.   ??? Blood-Glucose Meter monitoring kit by Does Not Apply route. Use as directed     ??? Lancets & Blood Glucose Strips Cmpk 1 Units by Does Not Apply route as directed. Monitor BS BID Dx: 250.02     No current facility-administered medications for this encounter.      Allergies   Allergen Reactions   ??? Niaspan [Niacin] Other (comments)     Facial flushing and felt hot   ??? Aspirin Nausea and Vomiting   ??? Ibuprofen Swelling     Hand and feet swelling      History   Substance Use Topics   ??? Smoking status: Current Every Day Smoker -- 0.25 packs/day for 30 years     Types: Cigarettes     Last Attempt to Quit: 06/21/2011   ??? Smokeless tobacco: Never Used   ??? Alcohol Use: 1.0 oz/week     2 drink(s) per week      Comment: socially      History reviewed. No pertinent family history.     Review of Systems    Patient denies chest pain, tightness, pain radiating down left arm, palpitations, dizziness, lightheadedness, fevers, chills. Patient denies shortness of breath, wheezing, diarrhea, constipation, abdominal pain. Patient denies urinary problems, dysuria, hesitancy, urgency. Denies skin breakdown, rashes, insect bites or open area. Reports non-productive cough for 2 days, denies fever         Objective:   Patient Vitals for the past 8 hrs:   BP Temp Pulse SpO2 Height Weight   01/29/14 0746  127/70 mmHg 98.1 ??F (36.7 ??C) 92 98 % 5' 2.5" (1.588 m) 100.046 kg (220 lb 9 oz)     Temp (24hrs), Avg:98.1 ??F (36.7 ??C), Min:98.1 ??F (36.7 ??C), Max:98.1 ??F (36.7 ??C)      Physical Exam:     General: Alert, cooperative, no distress, appears stated age.    Eyes: Conjunctivae/corneas clear. EOMs intact.   Ears: Normal TMs and external ear canals both ears.   Nose: Nares normal.   Mouth/Throat: Lips, mucosa, and tongue normal. Teeth and gums normal.   Neck: Supple, symmetrical, trachea midline.   Back: Symmetric   Lungs: Clear to auscultation bilaterally.   Heart: Regular rate and rhythm, S1, S2 normal, no murmur, click, rub or gallop.   Abdomen: Soft, non-tender. Bowel sounds normal. No masses, No                      Organumegaly.   Musculoskeletal:  Decreased ROM in cervical spine due to discomfort, decreased ROM in both shoulders due to discomfort,  L>R, left grip < right    Extremities:Extremities normal, atraumatic, no cyanosis or edema.  Calfs                                 Supple, non tender to palpation.   Pulses: 2+ and symmetric on upper extremities.  CR  <2 seconds   Skin: Skin color, texture, turgor normal.  No rashes or lesions.   Lymph nodes: No adenopathy.   Neurologic: CNII-XII grossly intact.  Neurovascularly intact distally.    Labs:   Recent Results (from the past 24 hour(s))   EKG, 12 LEAD, INITIAL    Collection Time     01/29/14  7:53 AM       Result Value Ref Range    Ventricular Rate 88      Atrial Rate 88      P-R Interval 152      QRS Duration 76      Q-T Interval 364      QTC Calculation (Bezet) 440      Calculated P Axis 66      Calculated R Axis 14      Calculated T Axis 26      Diagnosis        Value: Normal sinus rhythm  Normal ECG  When compared with ECG of 01-Nov-2008 12:08,  No significant change was found         Assessment:     Patient Active Problem List    Diagnosis Date Noted   ??? Tobacco use disorder 04/29/2012   ??? Unspecified vitamin D deficiency 03/31/2012   ??? Lumbar spondylosis 06/01/2011   ??? Microalbuminuria 04/30/2011   ??? Encounter for long-term (current) use of other medications 04/01/2011   ??? Hidradenitis suppurativa 12/04/2010   ??? Dysthymic disorder 08/06/2010   ??? Mixed hyperlipidemia 01/10/2010   ??? Mgrn w aura wo ntrc  mgrn 11/12/2009   ??? Allergic rhinitis due to other allergen 10/11/2009   ??? Cyst    ??? Morbid obesity 07/05/2009   ??? Unspecified sleep apnea 06/14/2009   ??? Unspecified Tachycardia 03/04/2009   ??? DM w/o Complication Type II 12/29/3233   ??? Urge Incontinence 10/31/2008   ??? Esophageal Reflux 07/11/2008   ??? Lumbago 07/11/2008   ??? Essential Hypertension, Benign 07/11/2008   ??? Bipolar Affective, Depress, Unspec 07/11/2008       Chronic neck  pain with radiculopathy    Plan:     Scheduled for C5-C6 ACDF, instumentation.  Instructed to bring neck brace am of surgery    Labs and EKG done in PAT  Labs and EKG reviewed and were unremarkable    She has a scheduled appointment with her PCP, Dr. Nicola Girt, 02-01-14. She will have her cough evaluated   02-02-14- called for and received office note from Dr. Nicola Girt. He recommended moist heat for a small right axillary cyst. I called Mary Mayo and she reports that her cough has completely resolved.     Garnett Farm, NP

## 2014-01-29 NOTE — Other (Signed)
[facility]  PREOPERATIVE INSTRUCTIONS    Surgery Date:   02-05-14 monday  Surgery arrival time given by surgeon: NO   If ???no???,[facility] staff will call you between 4 PM- 8 PM the day before surgery with your arrival time. If your surgery is on a Monday, we will call you the preceding Friday.       Please call [phone] after 8 PM if you did not receive your arrival time. 02-02-14    1. Please report at the designated time to the 2nd Floor Admitting Desk.  Bring your insurance card, photo identification, and any copayment ( if applicable).  2. You must have a responsible adult to drive you home. You need to have a responsible adult to stay with you the first 24 hours after surgery if you are going home the same day of your surgery and you should not drive a car for 24 hours following your surgery.  3. Nothing to eat or drink after midnight the night before surgery. This includes no water, gum, mints, coffee, juice, etc.  Please note special instructions, if applicable, below for medications.  4. MEDICATIONS TO TAKE THE MORNING OF SURGERY WITH A SIP OF WATER: xanax, catapress, flonase, protonix  5. No alcoholic beverages 24 hours before or after your surgery.  6. If you are being admitted to the hospital,please leave personal belongings/luggage in your car until you have an assigned hospital room number.  7. Stop Aspirin and/or any non-steroidal anti-inflammatory drugs (i.e. Ibuprofen, Naproxen, Advil, Aleve) as directed by your surgeon.  You may take Tylenol.        Stop herbal supplements 1 week prior to  surgery.  8. If you are currently taking Plavix, Coumadin,or any other blood-thinning/anticoagulant medication contact your surgeon for instructions.  9. Please wear comfortable clothes. Wear your glasses instead of contacts. We ask that all money, jewelry and valuables be left at home. Wear no make up, particularly mascara, the day of surgery.   10.  All body piercings, rings,and jewelry need to be removed and left at home.     Please wear your hair loose or down. Please no pony-tails, buns, or any metal hair accessories. If you shower the morning of surgery, please do not apply any lotions, powders, or deodorants afterwards.   Do not shave any body area within 24 hours of your surgery.  11. Please follow all instructions to avoid any potential surgical cancellation.  12.  Should your physical condition change, (i.e. fever, cold, flu, etc.) please notify your surgeon as soon as possible.  13. It is important to be on time. If a situation occurs where you may be delayed, please call:  [phone] / [phone]) [phone] on the day of surgery.  14. The Preadmission Testing staff can be reached at [phone]..  15. Special instructions: Check your blood sugar the morning of surgery.  If low take a glucose tablet.  Eat a good supper and you may have a protein bedtime snack (before midnight).  Do not take your diabetic medications the morning of surgery.   Bring insurance cards and picture ID.  Valet Parking.  Pain Brochure reviewed and given to patient.  Use Chlorhexidine wash for 3 days prior to surgery as instructed.  If your HgbA1c is elevated, you will get a phone call from our diabetic treatment center.    The patient was contacted  in person.   She  verbalize  understanding of all instructions does not  need reinforcement.

## 2014-01-30 LAB — EKG, 12 LEAD, INITIAL
Atrial Rate: 88 {beats}/min
Calculated P Axis: 66 degrees
Calculated R Axis: 14 degrees
Calculated T Axis: 26 degrees
P-R Interval: 152 ms
Q-T Interval: 364 ms
QRS Duration: 76 ms
QTC Calculation (Bezet): 440 ms
Ventricular Rate: 88 {beats}/min

## 2014-01-30 LAB — CULTURE, MRSA

## 2014-02-05 ENCOUNTER — Inpatient Hospital Stay
Admit: 2014-02-05 | Discharge: 2014-02-07 | Disposition: A | Discharge status: Home / Self Care | Payer: MEDICARE | Attending: Orthopaedic Surgery | Admitting: Orthopaedic Surgery

## 2014-02-05 DIAGNOSIS — M4802 Spinal stenosis, cervical region: Secondary | ICD-10-CM

## 2014-02-05 LAB — GLUCOSE, POC
Glucose (POC): 168 mg/dL — ABNORMAL HIGH (ref 65–105)
Glucose (POC): 202 mg/dL — ABNORMAL HIGH (ref 65–105)
Glucose (POC): 193 mg/dL — ABNORMAL HIGH (ref 65–105)

## 2014-02-05 LAB — CBC W/O DIFF
HCT: 40.7 % (ref 35.0–47.0)
HGB: 12.7 g/dL (ref 11.5–16.0)
MCH: 28.9 PG (ref 26.0–34.0)
MCHC: 31.2 g/dL (ref 30.0–36.5)
MCV: 92.5 FL (ref 80.0–99.0)
PLATELET: 163 10*3/uL (ref 150–400)
RBC: 4.4 M/uL (ref 3.80–5.20)
RDW: 14 % (ref 11.5–14.5)
WBC: 9.5 10*3/uL (ref 3.6–11.0)

## 2014-02-05 LAB — HEMOGLOBIN A1C WITH EAG
Est. average glucose: 157 mg/dL
Hemoglobin A1c: 7.1 % — ABNORMAL HIGH (ref 4.2–6.3)

## 2014-02-05 MED ORDER — ONDANSETRON (PF) 4 MG/2 ML INJECTION
4 mg/2 mL | INTRAMUSCULAR | Status: DC | PRN
Start: 2014-02-05 — End: 2014-02-05
  Administered 2014-02-05 (×2): via INTRAVENOUS

## 2014-02-05 MED ORDER — GLYCOPYRROLATE 0.2 MG/ML IJ SOLN
0.2 mg/mL | INTRAMUSCULAR | Status: DC | PRN
Start: 2014-02-05 — End: 2014-02-05
  Administered 2014-02-05: 14:00:00 via INTRAVENOUS

## 2014-02-05 MED ORDER — LABETALOL 5 MG/ML IV SYRINGE
20 mg/4 mL (5 mg/mL) | INTRAVENOUS | Status: DC | PRN
Start: 2014-02-05 — End: 2014-02-05
  Administered 2014-02-05: 14:00:00 via INTRAVENOUS

## 2014-02-05 MED ORDER — ALBUTEROL SULFATE 0.083 % (0.83 MG/ML) SOLN FOR INHALATION
2.5 mg /3 mL (0.083 %) | RESPIRATORY_TRACT | Status: DC | PRN
Start: 2014-02-05 — End: 2014-02-05

## 2014-02-05 MED ORDER — LIDOCAINE (PF) 10 MG/ML (1 %) IJ SOLN
10 mg/mL (1 %) | INTRAMUSCULAR | Status: DC | PRN
Start: 2014-02-05 — End: 2014-02-05

## 2014-02-05 MED ORDER — ONDANSETRON (PF) 4 MG/2 ML INJECTION
4 mg/2 mL | INTRAMUSCULAR | Status: DC | PRN
Start: 2014-02-05 — End: 2014-02-05

## 2014-02-05 MED ORDER — FENTANYL CITRATE (PF) 50 MCG/ML IJ SOLN
50 mcg/mL | INTRAMUSCULAR | Status: DC | PRN
Start: 2014-02-05 — End: 2014-02-05
  Administered 2014-02-05: 13:00:00 via INTRAVENOUS

## 2014-02-05 MED ORDER — NEOSTIGMINE METHYLSULFATE 1 MG/ML INJECTION
1 mg/mL | INTRAMUSCULAR | Status: DC | PRN
Start: 2014-02-05 — End: 2014-02-05
  Administered 2014-02-05: 14:00:00 via INTRAVENOUS

## 2014-02-05 MED ORDER — ROCURONIUM 10 MG/ML IV
10 mg/mL | INTRAVENOUS | Status: DC | PRN
Start: 2014-02-05 — End: 2014-02-05
  Administered 2014-02-05 (×2): via INTRAVENOUS

## 2014-02-05 MED ORDER — CEFAZOLIN 2 G IN 100 ML 0.9% NS
2 gram/100 mL | INTRAVENOUS | Status: DC | PRN
Start: 2014-02-05 — End: 2014-02-05
  Administered 2014-02-05: 12:00:00 via INTRAVENOUS

## 2014-02-05 MED ORDER — DIPHENHYDRAMINE HCL 50 MG/ML IJ SOLN
50 mg/mL | INTRAMUSCULAR | Status: DC | PRN
Start: 2014-02-05 — End: 2014-02-05

## 2014-02-05 MED ORDER — LACTATED RINGERS IV
INTRAVENOUS | Status: DC | PRN
Start: 2014-02-05 — End: 2014-02-05
  Administered 2014-02-05 (×2): via INTRAVENOUS

## 2014-02-05 MED ORDER — LIDOCAINE (PF) 20 MG/ML (2 %) IJ SOLN
20 mg/mL (2 %) | INTRAMUSCULAR | Status: DC | PRN
Start: 2014-02-05 — End: 2014-02-05
  Administered 2014-02-05: 13:00:00 via INTRAVENOUS

## 2014-02-05 MED ORDER — SUCCINYLCHOLINE CHLORIDE 20 MG/ML INJECTION
20 mg/mL | INTRAMUSCULAR | Status: DC | PRN
Start: 2014-02-05 — End: 2014-02-05
  Administered 2014-02-05: 13:00:00 via INTRAVENOUS

## 2014-02-05 MED ORDER — MEPERIDINE (PF) 25 MG/ML INJ SOLUTION
25 mg/ml | INTRAMUSCULAR | Status: DC | PRN
Start: 2014-02-05 — End: 2014-02-05

## 2014-02-05 MED ORDER — HYDROMORPHONE (PF) 1 MG/ML IJ SOLN
1 mg/mL | INTRAMUSCULAR | Status: DC | PRN
Start: 2014-02-05 — End: 2014-02-05
  Administered 2014-02-05 (×3): via INTRAVENOUS

## 2014-02-05 MED ORDER — PROPOFOL 10 MG/ML IV EMUL
10 mg/mL | INTRAVENOUS | Status: DC | PRN
Start: 2014-02-05 — End: 2014-02-05
  Administered 2014-02-05: 13:00:00 via INTRAVENOUS

## 2014-02-05 MED ADMIN — acetaminophen (TYLENOL) tablet 650 mg: ORAL | @ 22:00:00 | NDC 50580048790

## 2014-02-05 MED ADMIN — fluticasone (FLONASE) 50 mcg/actuation nasal spray 2 Spray: NASAL | @ 17:00:00 | NDC 96619010284

## 2014-02-05 MED ADMIN — thrombin (THROMBOGEN) topical solution: TOPICAL | @ 14:00:00 | NDC 60793021720

## 2014-02-05 MED ADMIN — labetalol (NORMODYNE;TRANDATE) injection 10 mg: INTRAVENOUS | @ 15:00:00 | NDC 00409226720

## 2014-02-05 MED ADMIN — sodium chloride 0.9 % bolus infusion 250 mL: INTRAVENOUS | NDC 00409798302

## 2014-02-05 MED ADMIN — lactated ringers infusion: INTRAVENOUS | @ 15:00:00 | NDC 11845118709

## 2014-02-05 MED ADMIN — metFORMIN ER (GLUCOPHAGE XR) tablet 1,000 mg: ORAL | @ 23:00:00 | NDC 60505026001

## 2014-02-05 MED ADMIN — cloNIDine HCl (CATAPRES) tablet 0.2 mg: ORAL | @ 17:00:00 | NDC 68001023703

## 2014-02-05 MED ADMIN — HYDROmorphone (PF) 15 mg/30 ml (DILAUDID) PCA: INTRAVENOUS | @ 15:00:00 | NDC 02420030164

## 2014-02-05 MED ADMIN — HYDROmorphone (PF) (DILAUDID) injection: INTRAVENOUS | @ 14:00:00 | NDC 59011044225

## 2014-02-05 MED ADMIN — oxyCODONE IR (ROXICODONE) tablet 10 mg: ORAL | @ 18:00:00 | NDC 00406055223

## 2014-02-05 MED ADMIN — HYDROmorphone (PF) 15 mg/30 ml (DILAUDID) PCA: INTRAVENOUS | @ 16:00:00 | NDC 02420030164

## 2014-02-05 MED ADMIN — ondansetron (ZOFRAN) injection 4 mg: INTRAVENOUS | @ 17:00:00 | NDC 23155037831

## 2014-02-05 MED ADMIN — ondansetron (ZOFRAN) injection 4 mg: INTRAVENOUS | @ 22:00:00 | NDC 23155037831

## 2014-02-05 MED ADMIN — insulin lispro (HUMALOG) injection: SUBCUTANEOUS | @ 23:00:00 | NDC 99999192301

## 2014-02-05 MED ADMIN — gelatin adsorbable (GELFOAM) powder: @ 14:00:00 | NDC 00009043304

## 2014-02-05 MED ADMIN — HYDROmorphone (PF) (DILAUDID) injection: INTRAVENOUS | @ 13:00:00 | NDC 59011044225

## 2014-02-05 MED ADMIN — benzonatate (TESSALON) capsule 100 mg: ORAL | NDC 00904590460

## 2014-02-05 MED ADMIN — bacitracin 50,000 Units in sodium chloride irrigation 0.9 % 1,000 mL Irrigation: @ 14:00:00 | NDC 00409713809

## 2014-02-05 MED ADMIN — lactated ringers infusion: INTRAVENOUS | @ 11:00:00 | NDC 00409795309

## 2014-02-05 MED ADMIN — ceFAZolin in 0.9% NS (ANCEF) IVPB soln 2 g: INTRAVENOUS | @ 11:00:00 | NDC 44567084025

## 2014-02-05 MED ADMIN — labetalol (NORMODYNE;TRANDATE) injection 10 mg: INTRAVENOUS | @ 16:00:00 | NDC 00409226720

## 2014-02-05 MED ADMIN — 0.9% sodium chloride infusion: INTRAVENOUS | @ 15:00:00 | NDC 00409798309

## 2014-02-05 MED ADMIN — oxyCODONE IR (ROXICODONE) tablet 10 mg: ORAL | @ 23:00:00 | NDC 00406055223

## 2014-02-05 MED ADMIN — prochlorperazine (COMPAZINE) injection 10 mg: INTRAVENOUS | NDC 23155029431

## 2014-02-05 MED ADMIN — ceFAZolin in 0.9% NS (ANCEF) IVPB soln 2 g: INTRAVENOUS | @ 19:00:00 | NDC 09999966850

## 2014-02-05 MED ADMIN — pantoprazole (PROTONIX) tablet 40 mg: ORAL | @ 18:00:00 | NDC 00008060701

## 2014-02-05 MED FILL — HYDROMORPHONE 2 MG/ML INJECTION SOLUTION: 2 mg/mL | INTRAMUSCULAR | Qty: 1

## 2014-02-05 MED FILL — HYDROMORPHONE (PF) 1 MG/ML IJ SOLN: 1 mg/mL | INTRAMUSCULAR | Qty: 1

## 2014-02-05 MED FILL — BD POSIFLUSH NORMAL SALINE 0.9 % INJECTION SYRINGE: INTRAMUSCULAR | Qty: 10

## 2014-02-05 MED FILL — ACETAMINOPHEN 325 MG TABLET: 325 mg | ORAL | Qty: 2

## 2014-02-05 MED FILL — CEFAZOLIN 2 GRAM/50 ML NS IVPB: INTRAVENOUS | Qty: 50

## 2014-02-05 MED FILL — METFORMIN SR 500 MG 24 HR TABLET: 500 mg | ORAL | Qty: 2

## 2014-02-05 MED FILL — GELFOAM MUCOSAL POWDER: Qty: 2

## 2014-02-05 MED FILL — SODIUM CHLORIDE 0.9 % IV: INTRAVENOUS | Qty: 1000

## 2014-02-05 MED FILL — CLONIDINE 0.1 MG TAB: 0.1 mg | ORAL | Qty: 2

## 2014-02-05 MED FILL — INSULIN LISPRO 100 UNIT/ML INJECTION: 100 unit/mL | SUBCUTANEOUS | Qty: 2

## 2014-02-05 MED FILL — LACTATED RINGERS IV: INTRAVENOUS | Qty: 1000

## 2014-02-05 MED FILL — SODIUM CHLORIDE 0.9 % IV: INTRAVENOUS | Qty: 250

## 2014-02-05 MED FILL — OXYCODONE 5 MG TAB: 5 mg | ORAL | Qty: 2

## 2014-02-05 MED FILL — PROCHLORPERAZINE EDISYLATE 5 MG/ML INJECTION: 5 mg/mL | INTRAMUSCULAR | Qty: 2

## 2014-02-05 MED FILL — FENTANYL CITRATE (PF) 50 MCG/ML IJ SOLN: 50 mcg/mL | INTRAMUSCULAR | Qty: 10

## 2014-02-05 MED FILL — BACITRACIN 50,000 UNIT IM: 50000 unit | INTRAMUSCULAR | Qty: 50000

## 2014-02-05 MED FILL — LABETALOL 5 MG/ML IV SOLN: 5 mg/mL | INTRAVENOUS | Qty: 20

## 2014-02-05 MED FILL — THROMBIN-JMI 20,000 UNIT TOPICAL SOLUTION: 20000 unit | CUTANEOUS | Qty: 20000

## 2014-02-05 MED FILL — ONDANSETRON (PF) 4 MG/2 ML INJECTION: 4 mg/2 mL | INTRAMUSCULAR | Qty: 2

## 2014-02-05 MED FILL — SORE THROAT SPRAY: Qty: 180

## 2014-02-05 MED FILL — PANTOPRAZOLE 40 MG TAB, DELAYED RELEASE: 40 mg | ORAL | Qty: 1

## 2014-02-05 MED FILL — FLUTICASONE 50 MCG/ACTUATION NASAL SPRAY, SUSP: 50 mcg/actuation | NASAL | Qty: 16

## 2014-02-05 MED FILL — HYDROMORPHONE 15 MG/30 ML (0.5 MG/ML) IN 0.9% SOD.CHLORIDE INFUSION: 15 mg/30 mL (0.5 mg/mL) | INTRAVENOUS | Qty: 30

## 2014-02-05 MED FILL — FLEET ENEMA 19 GRAM-7 GRAM/118 ML: 19-7 gram/118 mL | RECTAL | Qty: 133

## 2014-02-05 MED FILL — BENZONATATE 100 MG CAP: 100 mg | ORAL | Qty: 1

## 2014-02-05 NOTE — Progress Notes (Signed)
Care Management Interventions   PCP Visit Scheduled by CM: No, primary is Dr. Harriett SineJeffrey Dodd.   Palliative Care Consult: No  Care Management Consult: No   Discharge Durable Medical Equipment: None at home  Physical Therapy Consult: No  Occupational Therapy Consult: No   Speech Therapy Consult: No  Confirm Follow Up Transport: Plan discussed with patient?: Son to transport   Pt Discharge Location: Home  Living Situation:   Patient lives in a 1 level home. Her son, Rutherford NailGeorge Ward [phone]) lives with her and will take her home and assist after discharge. There are no steps to enter the home. She has no DME, has not had home health services, has not been to a SNF. She has Rx coverage with [employer] and obtains Rx at [facility] [address] [facility] [address] in [place], [place]. CUrrently she has no discharge needs. RNavarro, RN

## 2014-02-05 NOTE — Progress Notes (Signed)
Bedside and Verbal shift change report given to Dereka, RN (oncoming nurse) by Tara, RN (offgoing nurse). Report included the following information SBAR, Kardex, Intake/Output, MAR and Recent Results.

## 2014-02-05 NOTE — Anesthesia Post-Procedure Evaluation (Signed)
Post-Anesthesia Evaluation and Assessment    Patient: Mary Mayo MRN: [id number]  SSN: [id number]    Date of Birth: 10/31/1966  Age: 48 y.o.  Sex: female       Cardiovascular Function/Vital Signs  Visit Vitals   Item Reading   ??? BP 164/97   ??? Pulse 81   ??? Temp 36.8 ??C (98.2 ??F)   ??? Resp 10   ??? Ht 5' 2.5" (1.588 m)   ??? Wt 100.046 kg (220 lb 9 oz)   ??? BMI 39.67 kg/m2   ??? SpO2 100%       Patient is status post general anesthesia for Procedure(s):  C5-C6 ACDF, INSTRUMENTATION.    Nausea/Vomiting: None    Postoperative hydration reviewed and adequate.    Pain:  Pain Scale 1: Visual (02/05/14 1022)  Pain Intensity 1: 1 (02/05/14 1022)   Managed    Neurological Status:   Neuro (WDL): Within Defined Limits (02/05/14 0929)  Neuro  LUE Motor Response: Purposeful (02/05/14 0929)  LLE Motor Response: Purposeful (02/05/14 0929)  RUE Motor Response: Purposeful (02/05/14 0929)  RLE Motor Response: Purposeful (02/05/14 0929)   At baseline    Mental Status and Level of Consciousness: Alert and oriented     Pulmonary Status:   O2 Device: Oxygen mask (02/05/14 0929)   Adequate oxygenation and airway patent    Complications related to anesthesia: None    Post-anesthesia assessment completed. No concerns    Signed By: Ronaldo MiyamotoScott F Noble Cicalese, MD     February 05, 2014

## 2014-02-05 NOTE — Op Note (Signed)
[facility]  [address]  [place], [place] [id number]    OPERATIVE REPORT      NAME: [name]    AGE: 48 y.o.    DATE OF BIRTH: Feb 08, 1966    MEDICAL RECORD NUMBER: [id number]    DATE OF SURGERY: 02/05/2014    OPERATIVE REPORT     PREOPERATIVE DIAGNOSIS: Cervical stenosis     POSTOPERATIVE DIAGNOSIS: Cervical stenosis    OPERATIVE PROCEDURE: C5 to C6 anterior cervical diskectomy and fusion with instrumentation and application of interbody spacer at C5-C6    SURGEON: Elba BarmanJoseph S. Tracye Szuch, MD     ASSISTANT: Madelaine BhatKarli Seefried, FNP     ANESTHESIA: General    COMPLICATIONS: None    ESTIMATED BLOOD LOSS: 40    INSTRUMENTATION: K2M    INDICATION FOR PROCEDURE: The patient is a very pleasant 48 y.o. female with cervical stenosis. The patient elected to proceed with operative intervention. She was aware of the risks, benefits, and alternatives. She was provided informed consent.     PROCEDURE: The patient was identified in the preoperative holding area. The anterior cervical spine was marked by me. She was transferred to the operating room where general anesthesia was given. She was also given perioperative antibiotics. The patient was placed supine on the operating room table. All bony prominences were well-padded. The shoulders were taped. The anterior cervical spine was prepped and draped in the usual standard fashion. We performed a surgical time-out. I made a skin incision on the left side. It was transverse. I exposed the anterior cervical spine. I placed a needle into the disk space to verify our level. I exposed the disc space with electrocautery from uncus to uncus. I brought in the operating room microscope.     I performed a diskectomy at C5-C6. I decompressed the spinal cord and nerve roots bilaterally. I prepared the endplates to bleeding bone. We had good hemostasis. I performed trial sizing. I placed a spacer into C5-C6 with the appropriate amount of tension and alignment.     I  then placed an anterior cervical plate into C5 and C6. The screws were locked to the plate according to the manufacture's specification. We had good hemostasis. I copiously irrigated the entire wound. I placed a deep drain. The wound was closed with 3-0 Vicryl and 4-0 Monocryl. A sterile dressing was applied. The patient was extubated and transferred to the recovery room in good medical condition.     I, Dr. Norlene DuelJoseph Orion Mole, performed the above procedures.     Norlene DuelJoseph Angelena Sand, MD  02/05/2014

## 2014-02-05 NOTE — H&P (Signed)
Date of Surgery Update:  Mary RichardSharon S Mayo was seen and examined.  History and physical has been reviewed. There have been no significant clinical changes since the completion of the originally dated History and Physical.    Signed By: Norlene DuelJoseph Sinclair Alligood, MD     February 05, 2014 7:28 AM

## 2014-02-05 NOTE — Anesthesia Pre-Procedure Evaluation (Addendum)
Anesthetic History   No history of anesthetic complications           Review of Systems / Medical History  Patient summary reviewed, nursing notes reviewed and pertinent labs reviewed    Pulmonary        Sleep apnea  Undiagnosed apnea and smoker (1/2 PPD)       Neuro/Psych   Within defined limits           Cardiovascular    Hypertension            Exercise tolerance: >4 METS     GI/Hepatic/Renal     GERD             Endo/Other    Diabetes: type 2    Morbid obesity and arthritis     Other Findings            Physical Exam    Airway  Mallampati: II  TM Distance: 4 - 6 cm  Neck ROM: normal range of motion   Mouth opening: Normal     Cardiovascular    Rhythm: regular  Rate: normal         Dental  No notable dental hx       Pulmonary  Breath sounds clear to auscultation               Abdominal  GI exam deferred       Other Findings            Anesthetic Plan    ASA: 3  Anesthesia type: general          Induction: Intravenous  Anesthetic plan and risks discussed with: Patient

## 2014-02-05 NOTE — Progress Notes (Signed)
Patient complaining of increasingly severe headache, posterior radiating to front.  Describes as "pressure." Emesis x3. Encouraged PCA, gave Tylenol. Notified Doloris HallSteve Young, PA.  New orders received.  Will continue to monitor patient.

## 2014-02-05 NOTE — Other (Signed)
TRANSFER - OUT REPORT:    Verbal report given to Delice Bisonara, RN on Bary RichardSharon S Nawaz  being transferred to 408 for routine post - op       Report consisted of patient???s Situation, Background, Assessment and   Recommendations(SBAR).     Information from the following report(s) SBAR, OR Summary, Intake/Output, MAR and Recent Results was reviewed with the receiving nurse.    Opportunity for questions and clarification was provided.

## 2014-02-05 NOTE — Op Note (Signed)
[facility]  [address] [place] [address]    OPERATIVE REPORT      NAME: Mary Mayo    AGE: 47 y.o.    DATE OF BIRTH: 05/20/1966    MEDICAL RECORD NUMBER: [id number]    DATE OF SURGERY: 02/05/2014    OPERATIVE REPORT     PREOPERATIVE DIAGNOSIS: Cervical stenosis     POSTOPERATIVE DIAGNOSIS: Cervical stenosis    OPERATIVE PROCEDURE: C5 to C6 anterior cervical diskectomy and fusion with instrumentation and application of interbody spacer at C5-C6    SURGEON: Tyress Loden S. Katelee Schupp, MD     ASSISTANT: Karli Seefried, FNP     ANESTHESIA: General    COMPLICATIONS: None    ESTIMATED BLOOD LOSS: 40    INSTRUMENTATION: K2M    INDICATION FOR PROCEDURE: The patient is a very pleasant 47 y.o. female with cervical stenosis. The patient elected to proceed with operative intervention. She was aware of the risks, benefits, and alternatives. She was provided informed consent.     PROCEDURE: The patient was identified in the preoperative holding area. The anterior cervical spine was marked by me. She was transferred to the operating room where general anesthesia was given. She was also given perioperative antibiotics. The patient was placed supine on the operating room table. All bony prominences were well-padded. The shoulders were taped. The anterior cervical spine was prepped and draped in the usual standard fashion. We performed a surgical time-out. I made a skin incision on the left side. It was transverse. I exposed the anterior cervical spine. I placed a needle into the disk space to verify our level. I exposed the disc space with electrocautery from uncus to uncus. I brought in the operating room microscope.     I performed a diskectomy at C5-C6. I decompressed the spinal cord and nerve roots bilaterally. I prepared the endplates to bleeding bone. We had good hemostasis. I performed trial sizing. I placed a spacer into C5-C6 with the appropriate amount of tension and alignment.     I  then placed an anterior cervical plate into C5 and C6. The screws were locked to the plate according to the manufacture's specification. We had good hemostasis. I copiously irrigated the entire wound. I placed a deep drain. The wound was closed with 3-0 Vicryl and 4-0 Monocryl. A sterile dressing was applied. The patient was extubated and transferred to the recovery room in good medical condition.     I, Dr. Gareld Obrecht, performed the above procedures.     Mordecai Tindol, MD  02/05/2014

## 2014-02-06 LAB — GLUCOSE, POC
Glucose (POC): 145 mg/dL — ABNORMAL HIGH (ref 65–105)
Glucose (POC): 165 mg/dL — ABNORMAL HIGH (ref 65–105)

## 2014-02-06 LAB — METABOLIC PANEL, BASIC
Anion gap: 8 mmol/L (ref 5–15)
BUN/Creatinine ratio: 19 (ref 12–20)
BUN: 12 MG/DL (ref 6–20)
CO2: 27 mmol/L (ref 21–32)
Calcium: 8.4 MG/DL — ABNORMAL LOW (ref 8.5–10.1)
Chloride: 102 mmol/L (ref 97–108)
Creatinine: 0.62 MG/DL (ref 0.45–1.15)
GFR est AA: >60 mL/min/{1.73_m2} (ref >60)
GFR est non-AA: >60 mL/min/{1.73_m2} (ref >60)
Glucose: 144 mg/dL — ABNORMAL HIGH (ref 65–100)
Potassium: 3.9 mmol/L (ref 3.5–5.1)
Sodium: 137 mmol/L (ref 136–145)

## 2014-02-06 MED ADMIN — sodium chloride (NS) flush 5-10 mL: INTRAVENOUS | @ 11:00:00 | NDC 87701099893

## 2014-02-06 MED ADMIN — oxyCODONE IR (ROXICODONE) tablet 10 mg: ORAL | @ 17:00:00 | NDC 00406055223

## 2014-02-06 MED ADMIN — cloNIDine HCl (CATAPRES) tablet 0.2 mg: ORAL | @ 04:00:00 | NDC 68001023703

## 2014-02-06 MED ADMIN — cloNIDine (CATAPRES) 0.3 mg/24 hr patch 1 Patch: TRANSDERMAL | @ 22:00:00 | NDC 82089010334

## 2014-02-06 MED ADMIN — oxyCODONE IR (ROXICODONE) tablet 10 mg: ORAL | @ 11:00:00 | NDC 00406055223

## 2014-02-06 MED ADMIN — oxyCODONE IR (ROXICODONE) tablet 10 mg: ORAL | @ 21:00:00 | NDC 68084035411

## 2014-02-06 MED ADMIN — ceFAZolin in 0.9% NS (ANCEF) IVPB soln 2 g: INTRAVENOUS | @ 04:00:00 | NDC 09999966850

## 2014-02-06 MED ADMIN — hydrALAZINE (APRESOLINE) 20 mg/mL injection 10 mg: INTRAVENOUS | @ 22:00:00 | NDC 17478093401

## 2014-02-06 MED ADMIN — 0.9% sodium chloride infusion: INTRAVENOUS | @ 01:00:00 | NDC 00409798309

## 2014-02-06 MED ADMIN — 0.9% sodium chloride infusion: INTRAVENOUS | @ 13:00:00 | NDC 00409798309

## 2014-02-06 MED ADMIN — sodium chloride (NS) flush 5-10 mL: INTRAVENOUS | @ 04:00:00 | NDC 87701099893

## 2014-02-06 MED ADMIN — cloNIDine HCl (CATAPRES) tablet 0.2 mg: ORAL | @ 13:00:00 | NDC 68001023703

## 2014-02-06 MED ADMIN — insulin lispro (HUMALOG) injection: SUBCUTANEOUS | @ 22:00:00 | NDC 99999192301

## 2014-02-06 MED ADMIN — famotidine (PEPCID) tablet 20 mg: ORAL | @ 13:00:00 | NDC 68001024008

## 2014-02-06 MED ADMIN — insulin lispro (HUMALOG) injection: SUBCUTANEOUS | @ 13:00:00 | NDC 99999192301

## 2014-02-06 MED ADMIN — cloNIDine HCl (CATAPRES) tablet 0.2 mg: ORAL | @ 22:00:00 | NDC 68001023703

## 2014-02-06 MED ADMIN — metFORMIN ER (GLUCOPHAGE XR) tablet 500 mg: ORAL | @ 22:00:00 | NDC 60505026001

## 2014-02-06 MED ADMIN — oxyCODONE-acetaminophen (PERCOCET) 5-325 mg per tablet 2 Tab: ORAL | @ 04:00:00 | NDC 68084035511

## 2014-02-06 MED ADMIN — metFORMIN ER (GLUCOPHAGE XR) tablet 1,000 mg: ORAL | @ 13:00:00 | NDC 60505026001

## 2014-02-06 MED ADMIN — traZODone (DESYREL) tablet 300 mg: ORAL | @ 04:00:00 | NDC 50111043402

## 2014-02-06 MED ADMIN — oxyCODONE-acetaminophen (PERCOCET) 5-325 mg per tablet 2 Tab: ORAL | @ 13:00:00 | NDC 68084035511

## 2014-02-06 MED ADMIN — metoprolol (LOPRESSOR) tablet 25 mg: ORAL | NDC 00378001805

## 2014-02-06 MED ADMIN — HYDROmorphone (PF) 15 mg/30 ml (DILAUDID) PCA: INTRAVENOUS | @ 01:00:00 | NDC 02420030164

## 2014-02-06 MED ADMIN — ALPRAZolam (XANAX) tablet 1 mg: ORAL | @ 17:00:00 | NDC 68084067211

## 2014-02-06 MED ADMIN — ceFAZolin in 0.9% NS (ANCEF) IVPB soln 2 g: INTRAVENOUS | @ 11:00:00 | NDC 09999966850

## 2014-02-06 MED ADMIN — famotidine (PEPCID) tablet 20 mg: ORAL | @ 04:00:00 | NDC 68001024008

## 2014-02-06 MED FILL — OXYCODONE 5 MG TAB: 5 mg | ORAL | Qty: 2

## 2014-02-06 MED FILL — CATAPRES-TTS-3 0.3 MG/24 HR TRANSDERMAL PATCH: 0.3 mg/24 hr | TRANSDERMAL | Qty: 1

## 2014-02-06 MED FILL — OXYCODONE-ACETAMINOPHEN 5 MG-325 MG TAB: 5-325 mg | ORAL | Qty: 2

## 2014-02-06 MED FILL — CLONIDINE 0.1 MG TAB: 0.1 mg | ORAL | Qty: 2

## 2014-02-06 MED FILL — LABETALOL 5 MG/ML IV SYRINGE: 20 mg/4 mL (5 mg/mL) | INTRAVENOUS | Qty: 10

## 2014-02-06 MED FILL — NEOSTIGMINE METHYLSULFATE 1 MG/ML INJECTION: 1 mg/mL | INTRAMUSCULAR | Qty: 2.5

## 2014-02-06 MED FILL — CEFAZOLIN 1 GRAM SOLUTION FOR INJECTION: 1 gram | INTRAMUSCULAR | Qty: 2

## 2014-02-06 MED FILL — TRAZODONE 100 MG TAB: 100 mg | ORAL | Qty: 3

## 2014-02-06 MED FILL — ONDANSETRON (PF) 4 MG/2 ML INJECTION: 4 mg/2 mL | INTRAMUSCULAR | Qty: 8

## 2014-02-06 MED FILL — METFORMIN SR 500 MG 24 HR TABLET: 500 mg | ORAL | Qty: 2

## 2014-02-06 MED FILL — ALPRAZOLAM 0.5 MG TAB: 0.5 mg | ORAL | Qty: 2

## 2014-02-06 MED FILL — QUELICIN 20 MG/ML INJECTION SOLUTION: 20 mg/mL | INTRAMUSCULAR | Qty: 100

## 2014-02-06 MED FILL — CEFAZOLIN 2 GRAM/50 ML NS IVPB: INTRAVENOUS | Qty: 50

## 2014-02-06 MED FILL — GLYCOPYRROLATE 0.2 MG/ML IJ SOLN: 0.2 mg/mL | INTRAMUSCULAR | Qty: 0.4

## 2014-02-06 MED FILL — DIPRIVAN 10 MG/ML INTRAVENOUS EMULSION: 10 mg/mL | INTRAVENOUS | Qty: 120

## 2014-02-06 MED FILL — INSULIN LISPRO 100 UNIT/ML INJECTION: 100 unit/mL | SUBCUTANEOUS | Qty: 2

## 2014-02-06 MED FILL — METFORMIN SR 500 MG 24 HR TABLET: 500 mg | ORAL | Qty: 1

## 2014-02-06 MED FILL — FAMOTIDINE 20 MG TAB: 20 mg | ORAL | Qty: 1

## 2014-02-06 MED FILL — XYLOCAINE-MPF 20 MG/ML (2 %) INJECTION SOLUTION: 20 mg/mL (2 %) | INTRAMUSCULAR | Qty: 40

## 2014-02-06 MED FILL — ROCURONIUM 10 MG/ML IV: 10 mg/mL | INTRAVENOUS | Qty: 50

## 2014-02-06 MED FILL — HYDRALAZINE 20 MG/ML IJ SOLN: 20 mg/mL | INTRAMUSCULAR | Qty: 1

## 2014-02-06 MED FILL — METOPROLOL TARTRATE 25 MG TAB: 25 mg | ORAL | Qty: 1

## 2014-02-06 NOTE — Progress Notes (Signed)
Bedside and Verbal shift change report given to Vernita, RN (oncoming nurse) by Tara, RN (offgoing nurse). Report included the following information SBAR, Kardex, Intake/Output, MAR and Recent Results.

## 2014-02-06 NOTE — Other (Signed)
DTC Progress Note    Recommendations/ Comments: Chart reviewed d/t elevated BG's.  Noted correctional insulin is ordered for BID only.  If pre lunch BG's are >140, please consider giving pt correctional insulin at lunch as well.  Pt may also benefit from a carb controlled diet to ensure consistent timing of tray delivery and any needed insulin administration.     Chart reviewed on Mary Mayo.    Patient is a 48 y.o. female with known Type 2 Diabetes on oral agent (monotherapy): metformin (generic) at home.    A1c:   Lab Results   Component Value Date/Time    Hemoglobin A1c 7.1 02/05/2014  2:05 PM       Recent Glucose Results:   Lab Results   Component Value Date/Time    GLU 144* 02/06/2014  6:08 AM    GLUCPOC 145* 02/06/2014  7:53 AM    GLUCPOC 193* 02/05/2014  4:42 PM        Lab Results   Component Value Date/Time    Creatinine 0.62 02/06/2014  6:08 AM       Active Orders   Diet    DIET REGULAR        PO intake: No data found.      Current hospital DM medication: correctional lispro- normal sensitivity, prior to bkfast and dinner only    Will continue to follow as needed.    Thank you.  Elhadj Girton K. Fulton MoleSundt, RD, CDE

## 2014-02-06 NOTE — Progress Notes (Signed)
ORTHOPAEDIC CERVICAL FUSION PROGRESS NOTE    NAME:     Mary Mayo   DOB:       [birth year]   MRN:       [id number]   DATE:      02/06/2014    POD:              1 Day Post-Op  S/P:              Procedure(s):  C5-C6 ACDF, INSTRUMENTATION    SUBJECTIVE:  C/O sore throat, no dysphagia. Headache.   No arm pain or numbness  Denies nausea/vomiting, headache, chest pain or shortness of breath  Recent Labs      02/06/14   0608  02/05/14   1405   HGB   --   12.7   HCT   --   40.7   NA  137   --    K  3.9   --    CL  102   --    CO2  27   --    BUN  12   --    CREA  0.62   --    GLU  144*   --      Patient Vitals for the past 12 hrs:   BP Temp Pulse Resp SpO2   02/06/14 0333 126/73 mmHg 98.6 ??F (37 ??C) 88 16 98 %   02/06/14 0019 118/61 mmHg 98.4 ??F (36.9 ??C) 85 16 97 %   02/05/14 2237 163/79 mmHg - 101 - -   02/05/14 2023 176/94 mmHg 98.5 ??F (36.9 ??C) 98 16 100 %     Exam:  Positive strength/ROM bilat upper ext.  Neuro intact to sensation  Dressings clean and dry  VISTA collar inplace / intact    PLAN:  Continue PO pain medications as needed  Cepachol lozenges at bedside  Advance to regular diet  Out of bed  D/C to home today  D/C Rx on chart      Scot JunKarli B Miranda Frese, NP

## 2014-02-06 NOTE — Consults (Addendum)
[facility]  [place], [place]  [address]  [phone]    Hospitalist Consult Note      NAME:  Mary Mayo   DOB:   1966/09/07   MRN:  [id number]     Requesting Physician Mary Lean, MD   Reason for Consult:  HTN      PCP:  Mary December, MD     Date/Time:  02/06/2014 5:05 PM       Assessment:      Active Problems:    Esophageal reflux (07/11/2008)      Essential hypertension, benign (07/11/2008)      Bipolar I disorder, most recent episode (or current) depressed, unspecified (07/11/2008)      Type II or unspecified type diabetes mellitus without mention of complication, not stated as uncontrolled (02/04/2009)      Unspecified sleep apnea (06/14/2009)      Cervical stenosis of spine (02/05/2014)      Spinal stenosis (02/05/2014)            Plan:       1.  Accelerated  Hypertension. Resume clonidin with PRN hydralazine. If continue to ne high may need another BP medication. Monitor closely.     2.  Esophageal reflux. Continue famotidine.     3.  Bipolar I disorder, most recent episode (or current) depressed, unspecified (07/11/2008). Pt is not on medication. Continue xanax PRN    4.  Type II or unspecified type diabetes mellitus without mention of complication, not stated as uncontrolled (02/04/2009). Continue metformin. Cover with SSI.     5.  Unspecified sleep apnea (06/14/2009). Not using any more CPAP. Stated that after lossing wt she doesn't need to use the machine    6. Cervical stenosis of spine (02/05/2014). Management per ortho     7.  Tobacco abuse. Counseled to quit. Spent 3 minutes in counseling. Pt requested nicotine patch      thank you for the consult. Will follow along with you               Subjective:     CHIEF COMPLAINT: none     HISTORY OF PRESENT ILLNESS:     Mary Mayo is a 48 y.o.  African American female who is admitted to the orthopedic Service with cervical stenosis.  We are asked to evaluate for medical management.   Mary Mayo with PMH of HTN, DM,  GERD, OSA, obesity admitted with neck pain for many years S/P C5 to C6 anterior cervical diskectomy and fusion with instrumentation and application of interbody spacer at C5-C6. After arrival to the floor her BP remained elevated.        Past Medical History   Diagnosis Date   ??? Hypertension    ??? Psychotic disorder      bipolar   ??? Cyst      excision of a chronic abscess of left axilla   ??? Arthritis      spinal stenosis    ??? Asthma      last uses inhaler 2004   ??? GERD (gastroesophageal reflux disease)    ??? Dysthymic disorder 08/06/2010   ??? Bipolar Affective, Depress, Unspec 07/11/2008   ??? Ill-defined condition      vitamin d deficiency   ??? Ill-defined condition      season allergies   ??? Ill-defined condition      high cholesterol   ??? Unspecified sleep apnea  last used 5 years ago- discontinued due to weight loss   ??? Diabetes      type 2        Past Surgical History   Procedure Laterality Date   ??? Hx wisdom teeth extraction     ??? Hx tonsillectomy     ??? Hx gyn  2004     hysterectomy   ??? Hx orthopaedic  12-2012     left foot heel spur       History   Substance Use Topics   ??? Smoking status: Current Every Day Smoker -- 0.25 packs/day for 30 years     Types: Cigarettes     Last Attempt to Quit: 06/21/2011   ??? Smokeless tobacco: Never Used   ??? Alcohol Use: 1.0 oz/week     2 drink(s) per week      Comment: socially        History reviewed. No pertinent family history.     Allergies   Allergen Reactions   ??? Niaspan [Niacin] Other (comments)     Facial flushing and felt hot   ??? Aspirin Nausea and Vomiting   ??? Ibuprofen Swelling     Hand and feet swelling        Prior to Admission medications    Medication Sig Start Date End Date Taking? Authorizing Provider   metFORMIN ER (GLUCOPHAGE XR) 500 mg tablet Take 2 tablets by mouth two (2) times daily (after meals). 41/28/78  Yes Mary Coss, MD   ALPRAZolam Duanne Moron) 1 mg tablet take 1 tablet by mouth twice a day 6/76/72  Yes Mary Coss, MD   traZODone (DESYREL) 300 mg  tablet Take 1 Tab by mouth nightly. 0/9/47  Yes Mary Coss, MD   pantoprazole (PROTONIX) 40 mg tablet Take 1 Tab by mouth daily. 0/9/62  Yes Mary Coss, MD   cloNIDine (CATAPRESS) 0.2 mg tablet Take 1 Tab by mouth two (2) times a day. 11/25/12  Yes Mary December, MD   oxyCODONE-acetaminophen (PERCOCET) 5-325 mg per tablet Take 2 Tabs by mouth two (2) times a day.    Historical Provider   fluticasone (FLONASE) 50 mcg/actuation nasal spray 2 Sprays by Both Nostrils route daily. 8/36/62   Mary Coss, MD   Blood-Glucose Meter monitoring kit by Does Not Apply route. Use as directed   12/12/09   Mary December, MD   Lancets & Blood Glucose Strips Cmpk 1 Units by Does Not Apply route as directed. Monitor BS BID Dx: 250.02 12/12/09   Mary December, MD       Current Facility-Administered Medications   Medication Dose Route Frequency   ??? cloNIDine (CATAPRES) 0.3 mg/24 hr patch 1 Patch  1 Patch TransDERmal Q7D   ??? hydrALAZINE (APRESOLINE) 20 mg/mL injection 10 mg  10 mg IntraVENous ONCE   ??? ALPRAZolam (XANAX) tablet 1 mg  1 mg Oral QID PRN   ??? cloNIDine HCl (CATAPRES) tablet 0.2 mg  0.2 mg Oral BID   ??? metFORMIN ER (GLUCOPHAGE XR) tablet 1,000 mg  1,000 mg Oral BIDPC   ??? oxyCODONE-acetaminophen (PERCOCET) 5-325 mg per tablet 2 Tab  2 Tab Oral BID   ??? traZODone (DESYREL) tablet 300 mg  300 mg Oral QHS   ??? benzocaine-menthol (CEPACOL) lozenge 1 Lozenge  1 Lozenge Mucous Membrane PRN   ??? 0.9% sodium chloride infusion  125 mL/hr IntraVENous CONTINUOUS   ??? sodium chloride (NS) flush 5-10 mL  5-10 mL IntraVENous Q8H   ???  sodium chloride (NS) flush 5-10 mL  5-10 mL IntraVENous PRN   ??? acetaminophen (TYLENOL) tablet 650 mg  650 mg Oral Q4H PRN   ??? HYDROmorphone (PF) (DILAUDID) injection 1 mg  1 mg IntraVENous Q4H PRN   ??? ondansetron (ZOFRAN) injection 4 mg  4 mg IntraVENous Q4H PRN   ??? phenol throat spray (CHLORASEPTIC) 1 Spray  1 Spray Oral PRN   ??? diphenhydrAMINE (BENADRYL) injection 12.5 mg  12.5 mg IntraVENous Q4H PRN    ??? famotidine (PEPCID) tablet 20 mg  20 mg Oral Q12H   ??? diazepam (VALIUM) tablet 5 mg  5 mg Oral Q6H PRN   ??? bisacodyl (DULCOLAX) tablet 5 mg  5 mg Oral DAILY PRN   ??? sodium phosphate (FLEET'S) enema 118 mL  1 Enema Rectal PRN   ??? insulin lispro (HUMALOG) injection   SubCUTAneous ACB&D   ??? glucose chewable tablet 16 g  4 Tab Oral PRN   ??? dextrose (D50W) injection syrg 12.5-25 g  12.5-25 g IntraVENous PRN   ??? glucagon (GLUCAGEN) injection 1 mg  1 mg IntraMUSCular PRN   ??? oxyCODONE IR (ROXICODONE) tablet 10 mg  10 mg Oral Q4H PRN   ??? oxyCODONE IR (ROXICODONE) tablet 5 mg  5 mg Oral Q4H PRN   ??? fluticasone (FLONASE) 50 mcg/actuation nasal spray 2 Spray  2 Spray Both Nostrils DAILY PRN   ??? influenza vaccine 2014-15(52yr)(PF) (AFLURIA) injection 0.5 mL  0.5 mL IntraMUSCular PRIOR TO DISCHARGE   ??? pneumococcal 23-valent (PNEUMOVAX 23) injection 0.5 mL  0.5 mL IntraMUSCular PRIOR TO DISCHARGE   ??? benzonatate (TESSALON) capsule 100 mg  100 mg Oral TID PRN   ??? prochlorperazine (COMPAZINE) injection 10 mg  10 mg IntraVENous Q6H PRN         Review of Systems:  (bold if positive, if negative)    Gen:  Eyes:  ENT:  CVS:  Pulm:  GI:    GU:    MS:  Skin:  Psych:  Endo:    Hem:  Renal:    Neuro:            Objective:      VITALS:    Vital signs reviewed; most recent are:    Visit Vitals   Item Reading   ??? BP 203/115   ??? Pulse 93   ??? Temp 98.4 ??F (36.9 ??C)   ??? Resp 18   ??? Ht 5' 2.5" (1.588 m)   ??? Wt 100.046 kg (220 lb 9 oz)   ??? BMI 39.67 kg/m2   ??? SpO2 100%     SpO2 Readings from Last 6 Encounters:   02/06/14 100%   02/06/14 100%   01/29/14 98%   07/31/13 96%   04/26/13 99%   12/26/12 100%    O2 Flow Rate (L/min): 2 l/min     Intake/Output Summary (Last 24 hours) at 02/06/14 1705  Last data filed at 02/06/14 1541   Gross per 24 hour   Intake      0 ml   Output   1320 ml   Net  -1320 ml            Exam:     Physical Exam:    Gen:  Well-developed, well-nourished, in no acute distress  HEENT:  Pink conjunctivae, PERRL, hearing intact  to voice, moist mucous membranes  Neck:  Supple, without masses, thyroid non-tender  Resp:  No accessory muscle use, clear breath sounds without wheezes rales or rhonchi  Card:  No murmurs, normal S1, S2 without thrills, bruits or peripheral edema  Abd:  Soft, non-tender, non-distended, normoactive bowel sounds are present, no palpable organomegaly and no detectable hernias  Lymph:  No cervical or inguinal adenopathy  Musc:  No cyanosis or clubbing  Skin:  No rashes or ulcers, skin turgor is good  Neuro:  Cranial nerves are grossly intact, no focal motor weakness, follows commands appropriately  Psych:  Good insight, oriented to person, place and time, alert       Preoperative Labs:    Recent Labs      02/05/14   1405   WBC  9.5   HGB  12.7   HCT  40.7   PLT  163     Recent Labs      02/06/14   0608   NA  137   K  3.9   CL  102   CO2  27   GLU  144*   BUN  12   CREA  0.62   CA  8.4*     Lab Results   Component Value Date/Time    Glucose (POC) 165 02/06/2014  3:56 PM    Glucose (POC) 145 02/06/2014  7:53 AM    Glucose (POC) 193 02/05/2014  4:42 PM    Glucose (POC) 202 02/05/2014  9:35 AM    Glucose (POC) 168 02/05/2014  6:22 AM    Glucose POC 274 12/12/2009  8:42 AM     No results for input(s): PH, PCO2, PO2, HCO3, FIO2 in the last 72 hours.  No results for input(s): INR in the last 72 hours.    No results found for this basename: SDES     Lab Results   Component Value Date/Time    Culture result: MRSA NOT PRESENT 01/29/2014  8:30 AM    Culture result:     Screening of patient nares for MRSA is for surveillance purposes and, if positive, to facilitate isolation considerations in high risk settings. It is not intended for automatic decolonization interventions per se as regimens are not sufficiently effective to warrant routine use. 01/29/2014  8:30 AM       ___________________________________________________    Attending Physician: Cyndra Numbers, MD

## 2014-02-06 NOTE — Progress Notes (Addendum)
4:26 PM  Paged [facility] [place] on call PA for pt/'s BP 203/115.  Order received for hospitalist consult.     4:42 PM  MD Eulah PontMurphy notified of BP.  Order to give scheduled dose of catapres now and catapres 0.3 patch now. Will continue to monitor.     5:15 PM  Spoke with MD Tefera at nurse's station.  New orders received.    6:25 PM  Called MD Tefera, pt's BP still elevated. New orders received.  Will continue to monitor.

## 2014-02-06 NOTE — Progress Notes (Signed)
Bedside shift change report given to Barnetta Chapelara Buckley RN (oncoming nurse) by Trecia Rogersereka Anthony RN (offgoing nurse). Report included the following information SBAR, OR Summary, Intake/Output, MAR and Med Rec Status.

## 2014-02-07 LAB — GLUCOSE, POC
Glucose (POC): 137 mg/dL — ABNORMAL HIGH (ref 65–105)
Glucose (POC): 163 mg/dL — ABNORMAL HIGH (ref 65–105)

## 2014-02-07 MED ORDER — DOCUSATE SODIUM 100 MG CAP
100 mg | ORAL_CAPSULE | Freq: Two times a day (BID) | ORAL | Status: AC
Start: 2014-02-07 — End: 2014-05-08

## 2014-02-07 MED ORDER — METOPROLOL TARTRATE 50 MG TAB
50 mg | ORAL_TABLET | Freq: Two times a day (BID) | ORAL | Status: DC
Start: 2014-02-07 — End: 2015-04-17

## 2014-02-07 MED ORDER — OXYCODONE-ACETAMINOPHEN 5 MG-325 MG TAB
5-325 mg | ORAL_TABLET | ORAL | Status: DC | PRN
Start: 2014-02-07 — End: 2015-04-17

## 2014-02-07 MED ORDER — PROMETHAZINE 25 MG TAB
25 mg | ORAL_TABLET | Freq: Four times a day (QID) | ORAL | Status: DC | PRN
Start: 2014-02-07 — End: 2015-04-17

## 2014-02-07 MED ORDER — DIAZEPAM 5 MG TAB
5 mg | ORAL_TABLET | Freq: Four times a day (QID) | ORAL | Status: DC | PRN
Start: 2014-02-07 — End: 2015-04-17

## 2014-02-07 MED ADMIN — oxyCODONE-acetaminophen (PERCOCET) 5-325 mg per tablet 2 Tab: ORAL | @ 01:00:00 | NDC 68084035511

## 2014-02-07 MED ADMIN — bisacodyl (DULCOLAX) tablet 5 mg: ORAL | @ 03:00:00 | NDC 00904792761

## 2014-02-07 MED ADMIN — cloNIDine HCl (CATAPRES) tablet 0.2 mg: ORAL | @ 01:00:00 | NDC 68001023703

## 2014-02-07 MED ADMIN — HYDROmorphone (PF) (DILAUDID) injection 1 mg: INTRAVENOUS | @ 10:00:00 | NDC 00409255201

## 2014-02-07 MED ADMIN — famotidine (PEPCID) tablet 20 mg: ORAL | @ 01:00:00 | NDC 68001024008

## 2014-02-07 MED ADMIN — famotidine (PEPCID) tablet 20 mg: ORAL | @ 13:00:00 | NDC 68001024008

## 2014-02-07 MED ADMIN — insulin lispro (HUMALOG) injection: SUBCUTANEOUS | @ 13:00:00 | NDC 99999192301

## 2014-02-07 MED ADMIN — diphenhydrAMINE (BENADRYL) injection 12.5 mg: INTRAVENOUS | @ 01:00:00 | NDC 63323066401

## 2014-02-07 MED ADMIN — oxyCODONE IR (ROXICODONE) tablet 10 mg: ORAL | @ 06:00:00 | NDC 00406055223

## 2014-02-07 MED ADMIN — hydrALAZINE (APRESOLINE) 20 mg/mL injection 10 mg: INTRAVENOUS | @ 13:00:00 | NDC 17478093401

## 2014-02-07 MED ADMIN — oxyCODONE-acetaminophen (PERCOCET) 5-325 mg per tablet 2 Tab: ORAL | @ 13:00:00 | NDC 68084035511

## 2014-02-07 MED ADMIN — metoprolol (LOPRESSOR) tablet 25 mg: ORAL | @ 13:00:00 | NDC 00378001805

## 2014-02-07 MED ADMIN — ondansetron (ZOFRAN) injection 4 mg: INTRAVENOUS | @ 10:00:00 | NDC 23155037831

## 2014-02-07 MED ADMIN — diazepam (VALIUM) tablet 5 mg: ORAL | @ 10:00:00 | NDC 63739007310

## 2014-02-07 MED ADMIN — metFORMIN ER (GLUCOPHAGE XR) tablet 500 mg: ORAL | @ 13:00:00 | NDC 60505026001

## 2014-02-07 MED ADMIN — sodium chloride (NS) flush 5-10 mL: INTRAVENOUS | @ 01:00:00 | NDC 87701099893

## 2014-02-07 MED ADMIN — sodium chloride (NS) flush 5-10 mL: INTRAVENOUS | @ 10:00:00 | NDC 82903065462

## 2014-02-07 MED ADMIN — cloNIDine HCl (CATAPRES) tablet 0.2 mg: ORAL | @ 13:00:00 | NDC 68001023703

## 2014-02-07 MED ADMIN — traZODone (DESYREL) tablet 300 mg: ORAL | @ 03:00:00 | NDC 50111043402

## 2014-02-07 MED ADMIN — butalbital-acetaminophen-caffeine (FIORICET, ESGIC) per tablet 2 Tab: ORAL | @ 16:00:00 | NDC 00591336905

## 2014-02-07 MED ADMIN — sodium chloride (NS) flush 5-10 mL: INTRAVENOUS | @ 03:00:00 | NDC 87701099893

## 2014-02-07 MED FILL — METFORMIN SR 500 MG 24 HR TABLET: 500 mg | ORAL | Qty: 1

## 2014-02-07 MED FILL — DIPHENHYDRAMINE HCL 50 MG/ML IJ SOLN: 50 mg/mL | INTRAMUSCULAR | Qty: 1

## 2014-02-07 MED FILL — BISACODYL 5 MG TAB, DELAYED RELEASE: 5 mg | ORAL | Qty: 1

## 2014-02-07 MED FILL — CLONIDINE 0.1 MG TAB: 0.1 mg | ORAL | Qty: 2

## 2014-02-07 MED FILL — OXYCODONE-ACETAMINOPHEN 5 MG-325 MG TAB: 5-325 mg | ORAL | Qty: 2

## 2014-02-07 MED FILL — HYDRALAZINE 20 MG/ML IJ SOLN: 20 mg/mL | INTRAMUSCULAR | Qty: 1

## 2014-02-07 MED FILL — ONDANSETRON (PF) 4 MG/2 ML INJECTION: 4 mg/2 mL | INTRAMUSCULAR | Qty: 2

## 2014-02-07 MED FILL — FAMOTIDINE 20 MG TAB: 20 mg | ORAL | Qty: 1

## 2014-02-07 MED FILL — TRAZODONE 100 MG TAB: 100 mg | ORAL | Qty: 3

## 2014-02-07 MED FILL — HYDROMORPHONE (PF) 1 MG/ML IJ SOLN: 1 mg/mL | INTRAMUSCULAR | Qty: 1

## 2014-02-07 MED FILL — INSULIN LISPRO 100 UNIT/ML INJECTION: 100 unit/mL | SUBCUTANEOUS | Qty: 2

## 2014-02-07 MED FILL — METOPROLOL TARTRATE 25 MG TAB: 25 mg | ORAL | Qty: 1

## 2014-02-07 MED FILL — OXYCODONE 5 MG TAB: 5 mg | ORAL | Qty: 2

## 2014-02-07 MED FILL — DIAZEPAM 5 MG TAB: 5 mg | ORAL | Qty: 1

## 2014-02-07 MED FILL — BUTALBITAL-ACETAMINOPHEN-CAFFEINE 50 MG-325 MG-40 MG TAB: 50-325-40 mg | ORAL | Qty: 2

## 2014-02-07 NOTE — Progress Notes (Addendum)
Medical Progress Note      NAME: Mary Mayo   DOB:  June 06, 1966  MRM:  [id number]    Date/Time: 02/07/2014        Active Problems:    Esophageal reflux (07/11/2008)      Essential hypertension, benign (07/11/2008)      Bipolar I disorder, most recent episode (or current) depressed, unspecified (07/11/2008)      Type II or unspecified type diabetes mellitus without mention of complication, not stated as uncontrolled (02/04/2009)      Unspecified sleep apnea (06/14/2009)      Cervical stenosis of spine (02/05/2014)      Spinal stenosis (02/05/2014)          Assessment/Plan:   1.  Accelerated  Hypertension. Resume clonidine. Added metoprolol. FU with her PCP      2. Esophageal reflux. Continue famotidine.     3. Bipolar I disorder, most recent episode (or current) depressed, unspecified (07/11/2008). Pt is not on medication. Continue xanax PRN    4.  Type II or unspecified type diabetes mellitus without mention of complication, not stated as uncontrolled (02/04/2009). Continue metformin.      5. Unspecified sleep apnea (06/14/2009). Not using any more CPAP. Stated that after lossing wt she doesn't need to use the machine    6. Cervical stenosis of spine (02/05/2014). Management per ortho     7. Tobacco abuse. Counseled already    [facility] to discharge from medical stand point.       Subjective:     Chief Complaint:  Feels well.    ROS:  (bold if positive, if negative)      Tolerating PT  Tolerating Diet          Objective:       Vitals:          Last 24hrs VS reviewed since prior progress note. Most recent are:    Visit Vitals   Item Reading   ??? BP 156/94   ??? Pulse 97   ??? Temp 97.9 ??F (36.6 ??C)   ??? Resp 20   ??? Ht 5' 2.5" (1.588 m)   ??? Wt 100.046 kg (220 lb 9 oz)   ??? BMI 39.67 kg/m2   ??? SpO2 100%     SpO2 Readings from Last 6 Encounters:   02/07/14 100%   02/07/14 100%   01/29/14 98%   07/31/13 96%   04/26/13 99%   12/26/12 100%    O2 Flow Rate (L/min): 2 l/min     Intake/Output Summary (Last 24 hours) at 02/07/14 1101  Last  data filed at 02/07/14 0606   Gross per 24 hour   Intake    240 ml   Output     20 ml   Net    220 ml          Exam:     Physical Exam:    Gen:  Well-developed, well-nourished, in no acute distress  HEENT:  Pink conjunctivae, PERRL, hearing intact to voice, moist mucous membranes  Neck:  Supple, without masses, thyroid non-tender  Resp:  No accessory muscle use, clear breath sounds without wheezes rales or rhonchi  Card:  No murmurs, normal S1, S2 without thrills, bruits or peripheral edema  Abd:  Soft, non-tender, non-distended, normoactive bowel sounds are present, no palpable organomegaly and no detectable hernias  Lymph:  No cervical or inguinal adenopathy  Musc:  No cyanosis or clubbing  Skin:  No rashes or ulcers, skin  turgor is good  Neuro:  Cranial nerves are grossly intact, no focal motor weakness, follows commands appropriately  Psych:  Good insight, oriented to person, place and time, alert           Medications Reviewed: (see below)    Lab Data Reviewed: (see below)    ______________________________________________________________________    Medications:     Current Facility-Administered Medications   Medication Dose Route Frequency   ??? butalbital-acetaminophen-caffeine (FIORICET, ESGIC) per tablet 2 Tab  2 Tab Oral Q6H PRN   ??? metoprolol (LOPRESSOR) tablet 50 mg  50 mg Oral BID   ??? hydrALAZINE (APRESOLINE) 20 mg/mL injection 10 mg  10 mg IntraVENous Q2H PRN   ??? metFORMIN ER (GLUCOPHAGE XR) tablet 500 mg  500 mg Oral BIDPC   ??? insulin lispro (HUMALOG) injection   SubCUTAneous AC&HS   ??? ALPRAZolam (XANAX) tablet 1 mg  1 mg Oral QID PRN   ??? cloNIDine HCl (CATAPRES) tablet 0.2 mg  0.2 mg Oral BID   ??? oxyCODONE-acetaminophen (PERCOCET) 5-325 mg per tablet 2 Tab  2 Tab Oral BID   ??? traZODone (DESYREL) tablet 300 mg  300 mg Oral QHS   ??? benzocaine-menthol (CEPACOL) lozenge 1 Lozenge  1 Lozenge Mucous Membrane PRN   ??? sodium chloride (NS) flush 5-10 mL  5-10 mL IntraVENous Q8H   ??? sodium chloride (NS) flush  5-10 mL  5-10 mL IntraVENous PRN   ??? acetaminophen (TYLENOL) tablet 650 mg  650 mg Oral Q4H PRN   ??? HYDROmorphone (PF) (DILAUDID) injection 1 mg  1 mg IntraVENous Q4H PRN   ??? ondansetron (ZOFRAN) injection 4 mg  4 mg IntraVENous Q4H PRN   ??? phenol throat spray (CHLORASEPTIC) 1 Spray  1 Spray Oral PRN   ??? diphenhydrAMINE (BENADRYL) injection 12.5 mg  12.5 mg IntraVENous Q4H PRN   ??? famotidine (PEPCID) tablet 20 mg  20 mg Oral Q12H   ??? diazepam (VALIUM) tablet 5 mg  5 mg Oral Q6H PRN   ??? bisacodyl (DULCOLAX) tablet 5 mg  5 mg Oral DAILY PRN   ??? sodium phosphate (FLEET'S) enema 118 mL  1 Enema Rectal PRN   ??? glucose chewable tablet 16 g  4 Tab Oral PRN   ??? dextrose (D50W) injection syrg 12.5-25 g  12.5-25 g IntraVENous PRN   ??? glucagon (GLUCAGEN) injection 1 mg  1 mg IntraMUSCular PRN   ??? oxyCODONE IR (ROXICODONE) tablet 10 mg  10 mg Oral Q4H PRN   ??? oxyCODONE IR (ROXICODONE) tablet 5 mg  5 mg Oral Q4H PRN   ??? fluticasone (FLONASE) 50 mcg/actuation nasal spray 2 Spray  2 Spray Both Nostrils DAILY PRN   ??? influenza vaccine 2014-15(38yr+)(PF) (AFLURIA) injection 0.5 mL  0.5 mL IntraMUSCular PRIOR TO DISCHARGE   ??? pneumococcal 23-valent (PNEUMOVAX 23) injection 0.5 mL  0.5 mL IntraMUSCular PRIOR TO DISCHARGE   ??? benzonatate (TESSALON) capsule 100 mg  100 mg Oral TID PRN   ??? prochlorperazine (COMPAZINE) injection 10 mg  10 mg IntraVENous Q6H PRN            Lab Review:     Recent Labs      02/05/14   1405   WBC  9.5   HGB  12.7   HCT  40.7   PLT  163     Recent Labs      02/06/14   0608   NA  137   K  3.9   CL  102  CO2  27   GLU  144*   BUN  12   CREA  0.62   CA  8.4*     Lab Results   Component Value Date/Time    Glucose (POC) 163 02/07/2014  7:39 AM    Glucose (POC) 137 02/06/2014  9:33 PM    Glucose (POC) 165 02/06/2014  3:56 PM    Glucose (POC) 145 02/06/2014  7:53 AM    Glucose (POC) 193 02/05/2014  4:42 PM    Glucose POC 274 12/12/2009  8:42 AM     No results for input(s): PH, PCO2, PO2, HCO3, FIO2 in the last 72 hours.   No results for input(s): INR in the last 72 hours.    No results found for this basename: SDES     Lab Results   Component Value Date/Time    Culture result: MRSA NOT PRESENT 01/29/2014  8:30 AM    Culture result:     Screening of patient nares for MRSA is for surveillance purposes and, if positive, to facilitate isolation considerations in high risk settings. It is not intended for automatic decolonization interventions per se as regimens are not sufficiently effective to warrant routine use. 01/29/2014  8:30 AM                     Total time spent with patient: 6635                  Care Plan discussed with: Patient, Nursing Staff and >50% of time spent in counseling and coordination of care    Discussed:  Care Plan    Prophylaxis:  SCD's    Disposition:  Home w/Family           ___________________________________________________    Attending Physician: Otho DarnerMesfin Raydin Bielinski, MD

## 2014-02-07 NOTE — Progress Notes (Signed)
ORTHOPAEDIC CERVICAL FUSION PROGRESS NOTE    NAME:     Mary Mayo   DOB:       1966/04/28   MRN:       [id number]   DATE:      02/07/2014    POD:              2 Days Post-Op  S/P:              Procedure(s):  C5-C6 ACDF, INSTRUMENTATION    SUBJECTIVE:  C/O sore throat   No arm pain or numbness  Denies nausea/vomiting, headache, chest pain or shortness of breath  Recent Labs      02/06/14   0608  02/05/14   1405   HGB   --   12.7   HCT   --   40.7   NA  137   --    K  3.9   --    CL  102   --    CO2  27   --    BUN  12   --    CREA  0.62   --    GLU  144*   --      Patient Vitals for the past 12 hrs:   BP Temp Pulse Resp SpO2   02/07/14 0820 156/94 mmHg - 97 - -   02/07/14 0735 200/109 mmHg 97.9 ??F (36.6 ??C) 96 20 100 %   02/07/14 0437 149/99 mmHg 98 ??F (36.7 ??C) 88 20 98 %   02/07/14 0045 168/94 mmHg 98.1 ??F (36.7 ??C) 94 16 97 %     Exam:  Positive strength/ROM bilat upper ext.  Neuro intact to sensation  Dressings clean and dry  VISTA collar inplace / intact    PLAN:  Continue PO pain medications as needed  Cepachol lozenges at bedside  Advance to regular diet  Out of bed  Family Medicine to continue to treat HTN.   Cleared for discharge from Ortho perspective once HTN is under control.       Mary JunKarli B Suellen Durocher, NP

## 2014-02-07 NOTE — Progress Notes (Signed)
Bedside and Verbal shift change report given to Mikshu RN (oncoming nurse) by Vernita RN (offgoing nurse). Report included the following information SBAR, Kardex, Intake/Output, MAR and Recent Results.

## 2014-02-07 NOTE — Discharge Summary (Signed)
DISCHARGE SUMMARY     Patient: Mary Mayo Record Number: [id number]                DOB: 11-Jan-1966  Age: 48 y.o.  Admit Date: 02/05/2014  Discharge Date:   Admission Diagnosis: CERVICAL STENOSIS  Cervical stenosis of spine  Spinal stenosis  Discharge Diagnosis: CERVICAL STENOSIS  Procedures: Procedure(s):  C5-C6 ACDF, INSTRUMENTATION  Surgeon: Ludwig Lean, MD  Assistants:  Calton Dach, PA-C   Anesthesia: General  Complications: None     History of Present Illness:  Mary Mayo is a 48 y.o. female with a history of intractible neck pain and radiculopathy.  Despite conservative management and after clinical and radiographic evaluation, it was determined that she suffered from cervical stenosis and would benefit from Procedure(s):  C5-C6 ACDF, INSTRUMENTATION, which she consented to undergo after a discussion of the risks, benefits, alternatives, rehab concerns, and potential complications of surgery.    Hospital Course:  Mary Mayo tolerated the procedure well.  She was transferred  to the recovery room in stable condition.  After a brief stay, the patient was then transferred to the Orthopedic floor.  On postoperative day #1, the dressing was clean and dry and she was neurovascularly intact. The patient was afebrile and vital signs were stable.  Mary Mayo started physical therapy and continued until was deemed able to be discharged to Home  in stable condition on postoperative day 2.  She was provided with routine postoperative instructions and advised to follow up in my office in 3 weeks following discharge from the hospital.  She was given a brace, which they are required to wear for 3 weeks and prescriptions for medication to control post-operative symptoms.    Discharge Medications:  Current Discharge Medication List      START taking these medications    Details   docusate sodium (COLACE) 100 mg capsule Take 1 Cap by mouth two (2) times a day for 90 days.  Qty:  1 Cap, Refills: 0      promethazine (PHENERGAN) 25 mg tablet Take 1 Tab by mouth every six (6) hours as needed for Nausea.  Qty: 1 Tab, Refills: 0      diazepam (VALIUM) 5 mg tablet Take 1 Tab by mouth every six (6) hours as needed for Anxiety. Max Daily Amount: 20 mg.  Qty: 1 Tab, Refills: 0         CONTINUE these medications which have CHANGED    Details   oxyCODONE-acetaminophen (PERCOCET) 5-325 mg per tablet Take 1-2 Tabs by mouth every four (4) hours as needed for Pain. Max Daily Amount: 12 Tabs.  Qty: 1 Tab, Refills: 0         CONTINUE these medications which have NOT CHANGED    Details   metFORMIN ER (GLUCOPHAGE XR) 500 mg tablet Take 2 tablets by mouth two (2) times daily (after meals).  Qty: 120 tablet, Refills: 5    Associated Diagnoses: Type II or unspecified type diabetes mellitus without mention of complication, uncontrolled      ALPRAZolam (XANAX) 1 mg tablet take 1 tablet by mouth twice a day  Qty: 60 Tab, Refills: 0    Associated Diagnoses: Bipolar I disorder, most recent episode (or current) depressed, unspecified      traZODone (DESYREL) 300 mg tablet  Take 1 Tab by mouth nightly.  Qty: 30 Tab, Refills: 5    Associated Diagnoses: Bipolar I disorder, most recent episode (or current) depressed, unspecified      pantoprazole (PROTONIX) 40 mg tablet Take 1 Tab by mouth daily.  Qty: 30 Tab, Refills: 5    Associated Diagnoses: Esophageal reflux      cloNIDine (CATAPRESS) 0.2 mg tablet Take 1 Tab by mouth two (2) times a day.  Qty: 60 Tab, Refills: 11    Associated Diagnoses: Essential hypertension, benign      fluticasone (FLONASE) 50 mcg/actuation nasal spray 2 Sprays by Both Nostrils route daily.  Qty: 1 Bottle, Refills: 5    Associated Diagnoses: Allergic rhinitis due to other allergen      Blood-Glucose Meter monitoring kit by Does Not Apply route. Use as directed    Qty: 1 Kit, Refills: 0    Associated Diagnoses: Type II or unspecified type diabetes mellitus without mention of complication,  uncontrolled      Lancets & Blood Glucose Strips Cmpk 1 Units by Does Not Apply route as directed. Monitor BS BID Dx: 250.02  Qty: 100 Units, Refills: 5    Associated Diagnoses: Type II or unspecified type diabetes mellitus without mention of complication, uncontrolled             Lewis Shock, NP  02/07/2014

## 2014-02-07 NOTE — Progress Notes (Signed)
Received discharge instructions and scripts which were read and explained to her and her son. Dressing on neck C.D.I. Verbalized understanding of instructions. Name on script matched name on bracelet

## 2014-02-07 NOTE — Progress Notes (Signed)
Problem: Complex Spine Procedure: Post Op Day 2  Goal: Activity/Safety  Outcome: Progressing Towards Goal  Patient up out of bed with one assist. Ambulated in halls x1. Using call bell for assistance. Bathroom privileges with assist from son.

## 2014-02-08 NOTE — Progress Notes (Signed)
[facility].  Phoned patient who states she is currently seeing Dr Pollie Friarodd as her PCP.

## 2014-12-18 ENCOUNTER — Encounter

## 2015-01-01 ENCOUNTER — Ambulatory Visit: Payer: MEDICARE | Primary: Family Medicine

## 2015-01-14 ENCOUNTER — Inpatient Hospital Stay: Admit: 2015-01-14 | Payer: MEDICARE | Attending: Internal Medicine | Primary: Family Medicine

## 2015-01-14 DIAGNOSIS — Z1231 Encounter for screening mammogram for malignant neoplasm of breast: Secondary | ICD-10-CM

## 2015-04-16 ENCOUNTER — Encounter: Attending: Family Medicine | Primary: Family Medicine

## 2015-04-17 ENCOUNTER — Ambulatory Visit: Admit: 2015-04-17 | Discharge: 2015-04-17 | Payer: MEDICARE | Attending: Family Medicine | Primary: Family Medicine

## 2015-04-17 DIAGNOSIS — I10 Essential (primary) hypertension: Secondary | ICD-10-CM

## 2015-04-17 MED ORDER — LOSARTAN 50 MG TAB
50 mg | ORAL_TABLET | Freq: Every day | ORAL | Status: DC
Start: 2015-04-17 — End: 2015-05-03

## 2015-04-17 MED ORDER — ALPRAZOLAM 1 MG TAB
1 mg | ORAL_TABLET | Freq: Every evening | ORAL | Status: DC | PRN
Start: 2015-04-17 — End: 2015-05-03

## 2015-04-17 NOTE — Patient Instructions (Signed)
High Blood Pressure: Care Instructions  Your Care Instructions  If your blood pressure is usually above 140/90, you have high blood pressure, or hypertension. Despite what a lot of people think, high blood pressure usually doesn't cause headaches or make you feel dizzy or lightheaded. It usually has no symptoms. But it does increase your risk for heart attack, stroke, and kidney or eye damage. The higher your blood pressure, the more your risk increases.  Your doctor will give you a goal for your blood pressure. Your goal will be based on your health and your age. An example of a goal is to keep your blood pressure below 140/90.  Lifestyle changes, such as eating healthy and being active, are always important to help lower blood pressure. You might also take medicine to reach your blood pressure goal.  Follow-up care is a key part of your treatment and safety. Be sure to make and go to all appointments, and call your doctor if you are having problems. It's also a good idea to know your test results and keep a list of the medicines you take.  How can you care for yourself at home?  Medical treatment  ?? If you stop taking your medicine, your blood pressure will go back up. You may take one or more types of medicine to lower your blood pressure. Be safe with medicines. Take your medicine exactly as prescribed. Call your doctor if you think you are having a problem with your medicine.  ?? Talk to your doctor before you start taking aspirin every day. Aspirin can help certain people lower their risk of a heart attack or stroke. But taking aspirin isn't right for everyone, because it can cause serious bleeding.  ?? See your doctor regularly. You may need to see the doctor more often at first or until your blood pressure comes down.  ?? If you are taking blood pressure medicine, talk to your doctor before you take decongestants or anti-inflammatory medicine, such as ibuprofen.  Some of these medicines can raise blood pressure.  ?? Learn how to check your blood pressure at home.  Lifestyle changes  ?? Stay at a healthy weight. This is especially important if you put on weight around the waist. Losing even 10 pounds can help you lower your blood pressure.  ?? If your doctor recommends it, get more exercise. Walking is a good choice. Bit by bit, increase the amount you walk every day. Try for at least 30 minutes on most days of the week. You also may want to swim, bike, or do other activities.  ?? Avoid or limit alcohol. Talk to your doctor about whether you can drink any alcohol.  ?? Try to limit how much sodium you eat to less than 2,300 milligrams (mg) a day. Your doctor may ask you to try to eat less than 1,500 mg a day.  ?? Eat plenty of fruits (such as bananas and oranges), vegetables, legumes, whole grains, and low-fat dairy products.  ?? Lower the amount of saturated fat in your diet. Saturated fat is found in animal products such as milk, cheese, and meat. Limiting these foods may help you lose weight and also lower your risk for heart disease.  ?? Do not smoke. Smoking increases your risk for heart attack and stroke. If you need help quitting, talk to your doctor about stop-smoking programs and medicines. These can increase your chances of quitting for good.  When should you call for help?  Call your doctor   now or seek immediate medical care if:  ?? Your blood pressure is much higher than normal (such as 180/110 or higher).  ?? You think high blood pressure is causing symptoms such as:  ?? Severe headache.  ?? Blurry vision.  Watch closely for changes in your health, and be sure to contact your doctor if:  ?? You do not get better as expected.   Where can you learn more?   Go to http://www.healthwise.net/[facility]  Enter X567 in the search box to learn more about "High Blood Pressure: Care Instructions."   ?? 2006-2016 [employer]. Care instructions adapted under  license by [facility] (which disclaims liability or warranty for this information). This care instruction is for use with your licensed healthcare professional. If you have questions about a medical condition or this instruction, always ask your healthcare professional. [employer], Inc[employer] disclaims any warranty or liability for your use of this information.  Content Version: 10.8.513193; Current as of: November 02, 2014

## 2015-04-17 NOTE — Progress Notes (Signed)
Subjective:     Chief Complaint   Patient presents with   ??? Sleep Problem     states that the trazodone has not been helping. She wakes up int he middle of the night   ??? Anxiety     states that she was taking Xanax. Ran out, and is taking some from a friend.        She  is a 49 y.o. year old female who presents as a new patient to the practice to establish and discuss about medical concern. Her past medical history is significant for HTN, DM, back pain, Bipolar disorder, anxiety.     Pt reports that she has been on xanax for her anxiety and bipolar disorder for many years. Has seen psychiatrist in the past. Reports xanax is the only medication prescribed for her bipolar and trazodone for sleeping.  She is out of Xanax for last 4-6 weeks. Borrowed one or two from her friend. Trazodone 100 mg has been working well per patient. Pt reports she has enough refill.     DM: She is currently on Metformin 1000 mg BID. FBS has been running upper 130s.    HTN: Reports BP has been fluctuating. Couldn't tell me the range but she says its higher range. She is currently on clonidine. According to her med records I see that she was prescribed ARB, beta blocker in the past. When I asked about it she did not like the way the beta blocker was doing but she does not recall about taking Losartan.    Chronic back pain: H/O cervical stenosis followed by C5 to C6 anterior cervical diskectomy and fusion with instrumentation and application of interbody spacer at C5-C6 in 01/2014. She is not on any medication currently. Will discuss in detail in next appointment in one month.     Constitutional: negative for fevers and chills  Respiratory: negative for wheezing  Cardiovascular: negative for chest pain, chest pressure/discomfort  Gastrointestinal: negative for nausea and vomiting  Genitourinary:negative for frequency and dysuria  Musculoskeletal:positive for stiff joints and back pain, negative for muscle weakness  Objective:      Filed Vitals:    04/17/15 0958   BP: 167/112   Pulse: 91   Temp: 97.4 ??F (36.3 ??C)   TempSrc: Oral   Resp: 18   Height: 5' 2.5" (1.588 m)   Weight: 202 lb (91.627 kg)   SpO2: 97%       Physical Examination: General appearance - alert, well appearing, and in no distress and overweight  Mental status - alert, oriented to person, place, and time, normal mood, behavior, speech, dress, motor activity, and thought processes  Chest - clear to auscultation, no wheezes, rales or rhonchi, symmetric air entry  Heart - normal rate, regular rhythm, normal S1, S2, no murmurs, rubs, clicks or gallops  Extremities -  no pedal edema,   Gait normal.   Cervical spine ROM is normal     Allergies   Allergen Reactions   ??? Niaspan [Niacin] Other (comments)     Facial flushing and felt hot   ??? Aspirin Hives and Nausea and Vomiting   ??? Ibuprofen Swelling     Hand and feet swelling      History     Social History   ??? Marital Status: DIVORCED     Spouse Name: N/A   ??? Number of Children: N/A   ??? Years of Education: N/A     Social History Main Topics   ???  Smoking status: Current Every Day Smoker -- 0.25 packs/day for 30 years     Types: Cigarettes   ??? Smokeless tobacco: Never Used   ??? Alcohol Use: 1.0 oz/week     2 Standard drinks or equivalent per week      Comment: socially   ??? Drug Use: Yes     Special: Marijuana      Comment: last used 2 weeks ago   ??? Sexual Activity: Not Currently     Other Topics Concern   ??? None     Social History Narrative      Family History   Problem Relation Age of Onset   ??? Diabetes Mother    ??? Cancer Mother    ??? Hypertension Mother    ??? Hypertension Father       Past Surgical History   Procedure Laterality Date   ??? Hx wisdom teeth extraction     ??? Hx tonsillectomy     ??? Hx gyn  2004     hysterectomy   ??? Hx orthopaedic  12-2012     left foot heel spur      Past Medical History   Diagnosis Date   ??? Hypertension    ??? Cyst      excision of a chronic abscess of left axilla   ??? Arthritis      spinal stenosis    ??? Asthma       last uses inhaler 2004   ??? GERD (gastroesophageal reflux disease)    ??? Dysthymic disorder 08/06/2010   ??? Bipolar Affective, Depress, Unspec 07/11/2008   ??? Ill-defined condition      vitamin d deficiency   ??? Ill-defined condition      season allergies   ??? Ill-defined condition      high cholesterol   ??? Unspecified sleep apnea      last used 5 years ago- discontinued due to weight loss   ??? Diabetes ([place])      type 2   ??? Hypercholesterolemia    ??? Psychotic disorder      bipolar      Current Outpatient Prescriptions   Medication Sig Dispense Refill   ??? traZODone (DESYREL) 100 mg tablet Take 100 mg by mouth nightly.  6   ??? omeprazole (PRILOSEC) 10 mg capsule Take 10 mg by mouth daily.     ??? losartan (COZAAR) 50 mg tablet Take 1 Tab by mouth daily. 30 Tab 1   ??? ALPRAZolam (XANAX) 1 mg tablet Take 1 Tab by mouth nightly as needed for Anxiety. Max Daily Amount: 1 mg. take 1 tablet by mouth twice a day 15 Tab 0   ??? metFORMIN ER (GLUCOPHAGE XR) 500 mg tablet Take 2 tablets by mouth two (2) times daily (after meals). 120 tablet 5   ??? cloNIDine (CATAPRESS) 0.2 mg tablet Take 1 Tab by mouth two (2) times a day. 60 Tab 11   ??? Blood-Glucose Meter monitoring kit by Does Not Apply route. Use as directed   1 Kit 0   ??? Lancets & Blood Glucose Strips Cmpk 1 Units by Does Not Apply route as directed. Monitor BS BID Dx: 250.02 100 Units 5        Assessment/ Plan:   Domino was seen today for sleep problem and anxiety.    Diagnoses and all orders for this visit:    Essential hypertension, benign  Orders:  -   Start  losartan (COZAAR) 50 mg tablet; Take 1 Tab  by mouth daily. Follow up in one month with BP log book.     Bipolar I disorder, most recent episode depressed ([place])  Orders:  -    I strongly encouraged her to have a follow up with psychiatrist. Pt states that she has been working on making an appointment with psychiatrist. No SI .  -     I discussed that Xanax is not the solely treatment of  Bipolar, she  might need mood stabilizer and psychiatrist will be able to manage and prescribe that.  I am giving her 15 tab of ALPRAZolam (XANAX) 1 mg tablet; Take 1 Tab by mouth nightly as needed for Anxiety until seen by psychiatrist.    Diabetes Mellitus:   -    Need records from last PCP. According to patient she had A1c checked with last three months and it was controlled.     Medication risks/benefits/costs/interactions/alternatives discussed with patient.  Advised patient to call back or return to office if symptoms worsen/change/persist. If patient cannot reach [place] or should anything more severe/urgent arise he/she should proceed directly to the nearest emergency department.  Discussed expected course/resolution/complications of diagnosis in detail with patient.  Patient given a written after visit summary which includes her diagnoses, current medications and vitals.  Patient expressed understanding with the diagnosis and plan.       Follow-up Disposition:  Return in about 1 month (around 05/17/2015) for Bring BP log book., Bring diabetic log book.Marland Kitchen

## 2015-05-03 ENCOUNTER — Ambulatory Visit: Admit: 2015-05-03 | Discharge: 2015-05-03 | Payer: MEDICARE | Attending: Internal Medicine | Primary: Family Medicine

## 2015-05-03 DIAGNOSIS — E669 Obesity, unspecified: Secondary | ICD-10-CM

## 2015-05-03 MED ORDER — TRAZODONE 100 MG TAB
100 mg | ORAL_TABLET | Freq: Every evening | ORAL | Status: DC
Start: 2015-05-03 — End: 2016-01-21

## 2015-05-03 MED ORDER — OMEPRAZOLE 10 MG CAP, DELAYED RELEASE
10 mg | ORAL_CAPSULE | Freq: Every day | ORAL | Status: DC
Start: 2015-05-03 — End: 2015-10-04

## 2015-05-03 MED ORDER — METFORMIN SR 500 MG 24 HR TABLET
500 mg | ORAL_TABLET | Freq: Two times a day (BID) | ORAL | Status: DC
Start: 2015-05-03 — End: 2016-01-21

## 2015-05-03 MED ORDER — CLONIDINE 0.2 MG TAB
0.2 mg | ORAL_TABLET | Freq: Two times a day (BID) | ORAL | Status: DC
Start: 2015-05-03 — End: 2016-01-18

## 2015-05-03 MED ORDER — ALPRAZOLAM 1 MG TAB
1 mg | ORAL_TABLET | ORAL | Status: DC
Start: 2015-05-03 — End: 2015-08-29

## 2015-05-03 NOTE — Telephone Encounter (Signed)
Rx for Xanax approved, after PMP was reviewed.

## 2015-05-03 NOTE — Progress Notes (Signed)
Chief Complaint   Patient presents with   ??? New Patient   .      SUBECTIVE:    Mary Mayo is a 49 y.o. female comes in as a new patient.  She has a long history of hypertension.      She has also had diabetes for the last ten years but has lost a considerable amount of weight, specifically 100 pounds.    She states the blood sugars have been doing quite well.      She has a history of chronic low back pain and was most recently having a decompressive procedure in the cervical spine.      She is disabled as a direct result of bipolar disorder and takes Xanax although she has not seen a psychiatrist in years.          Current Outpatient Prescriptions   Medication Sig Dispense Refill   ??? traZODone (DESYREL) 100 mg tablet Take 1 Tab by mouth nightly. 30 Tab 11   ??? omeprazole (PRILOSEC) 10 mg capsule Take 1 Cap by mouth daily. 30 Cap 11   ??? ALPRAZolam (XANAX) 1 mg tablet take 1 tablet by mouth Daily p.r.n. 30 Tab 0   ??? metFORMIN ER (GLUCOPHAGE XR) 500 mg tablet Take 2 Tabs by mouth two (2) times daily (after meals). 120 Tab 11   ??? cloNIDine HCl (CATAPRES) 0.2 mg tablet Take 1 Tab by mouth two (2) times a day. 60 Tab 11   ??? Blood-Glucose Meter monitoring kit by Does Not Apply route. Use as directed   1 Kit 0   ??? Lancets & Blood Glucose Strips Cmpk 1 Units by Does Not Apply route as directed. Monitor BS BID Dx: 250.02 100 Units 5     Past Medical History   Diagnosis Date   ??? Hypertension    ??? Cyst      excision of a chronic abscess of left axilla   ??? Arthritis      spinal stenosis    ??? Asthma      last uses inhaler 2004   ??? GERD (gastroesophageal reflux disease)    ??? Dysthymic disorder 08/06/2010   ??? Bipolar Affective, Depress, Unspec 07/11/2008   ??? Ill-defined condition      vitamin d deficiency   ??? Ill-defined condition      season allergies   ??? Ill-defined condition      high cholesterol   ??? Unspecified sleep apnea      last used 5 years ago- discontinued due to weight loss   ??? Diabetes ([place])      type 2    ??? Hypercholesterolemia    ??? Psychotic disorder      bipolar     Past Surgical History   Procedure Laterality Date   ??? Hx wisdom teeth extraction     ??? Hx tonsillectomy     ??? Hx gyn  2004     hysterectomy   ??? Hx orthopaedic  12-2012     left foot heel spur     Allergies   Allergen Reactions   ??? Niaspan [Niacin] Other (comments)     Facial flushing and felt hot   ??? Aspirin Hives and Nausea and Vomiting   ??? Ibuprofen Swelling     Hand and feet swelling       REVIEW OF SYSTEMS:  Review of Systems - Negative except   ENT ROS: negative for - headaches, hearing change, nasal congestion, oral lesions, tinnitus,  visual changes or   Respiratory ROS: no cough, shortness of breath, or wheezing  Cardiovascular ROS: no chest pain or dyspnea on exertion  Gastrointestinal ROS: no abdominal pain, change in bowel habits, or black or blood  Genito-Urinary ROS: no dysuria, trouble voiding, or hematuria  Musculoskeletal ROS: See history of present illness  Neurological ROS: no TIA or stroke symptoms      History     Social History   ??? Marital Status: DIVORCED     Spouse Name: N/A   ??? Number of Children: 3   ??? Years of Education: 90     Occupational History   ??? Unemployed      Social History Main Topics   ??? Smoking status: Current Every Day Smoker -- 0.25 packs/day for 30 years     Types: Cigarettes   ??? Smokeless tobacco: Never Used   ??? Alcohol Use: 1.0 oz/week     2 Standard drinks or equivalent per week      Comment: socially   ??? Drug Use: Yes     Special: Marijuana      Comment: last used 2 weeks ago   ??? Sexual Activity: Not Currently     Other Topics Concern   ??? None     Social History Narrative   r  Family History   Problem Relation Age of Onset   ??? Diabetes Mother    ??? Cancer Mother    ??? Hypertension Mother    ??? Hypertension Father        OBJECTIVE:  BP 124/100 mmHg   Pulse 88   Temp(Src) 98 ??F (36.7 ??C) (Oral)   Resp 20   Ht 5' 2.5" (1.588 m)   Wt 200 lb (90.719 kg)   BMI 35.97 kg/m2   SpO2 98%  ENT: perrla,  eom intact   NECK: supple. Thyroid normal, no JVD  CHEST: clear to ascultation and percussion   HEART: regular rate and rhythm  ABD: soft, bowel sounds active,   EXTREMITIES: no edema, pulse 1+  INTEGUMENT: clear  Neurological: physiologic  Musculoskeletal: pain elicited to range of motion of LS-spine      ASSESSMENT:  1. Obesity (BMI 30-39.9)    2. Type 2 diabetes mellitus without complication ([place])    3. Bipolar 1 disorder ([place])    4. Physical exam, annual    5. Dyslipidemia    6. Chronic midline low back pain without sciatica    7. Essential hypertension, benign        PLAN:    1. One of her biggest problem is her weight.  She needs to lose.  We talk about this at length.  Carbohydrate restriction is emphasized with an avoidance of sweets, white bread, and white potatoes.   This is a major contributing factor to some of her existing comorbidities.    2. She does have a history of bipolar disorder.  She is taking Xanax.  I am not willing to give her more than one tablet a day as needed.  She is not actively seeing psychiatry.  This will have to be done for any dose changes.    3. She does have a history of diabetes and is taking her oral agent.  I will await the results of her hemoglobin A1C.  Carbohydrate restriction is emphasized.    4. Her cardiovascular risk is moderate.  I will await the results of her lipid profile.    5. She has chronic low back pain.  Symptomatic treatment for now.  She will follow-up with her pain physician regarding this.    6. She also has hypertension and will continue her antihypertensive medication as prescribed.      .  Orders Placed This Encounter   ??? HEMOGLOBIN A1C WITH EAG   ??? APOLIPOPROTEIN B   ??? CBC WITH AUTOMATED DIFF   ??? CRP, HIGH SENSITIVITY   ??? LIPID PANEL   ??? URINALYSIS W/ RFLX MICROSCOPIC   ??? TSH 3RD GENERATION   ??? METABOLIC PANEL, COMPREHENSIVE   ??? traZODone (DESYREL) 100 mg tablet   ??? omeprazole (PRILOSEC) 10 mg capsule   ??? ALPRAZolam (XANAX) 1 mg tablet    ??? metFORMIN ER (GLUCOPHAGE XR) 500 mg tablet   ??? cloNIDine HCl (CATAPRES) 0.2 mg tablet       Follow-up Disposition:  Return in about 4 weeks (around 05/31/2015).      Noni Saupe, MD

## 2015-05-04 LAB — CBC WITH AUTOMATED DIFF
ABS. BASOPHILS: 0 10*3/uL (ref 0.0–0.2)
ABS. EOSINOPHILS: 0.1 10*3/uL (ref 0.0–0.4)
ABS. IMM. GRANS.: 0 10*3/uL (ref 0.0–0.1)
ABS. MONOCYTES: 0.4 10*3/uL (ref 0.1–0.9)
ABS. NEUTROPHILS: 2.9 10*3/uL (ref 1.4–7.0)
Abs Lymphocytes: 4.4 10*3/uL — ABNORMAL HIGH (ref 0.7–3.1)
BASOPHILS: 0 %
EOSINOPHILS: 1 %
HCT: 41 % (ref 34.0–46.6)
HGB: 13.2 g/dL (ref 11.1–15.9)
IMMATURE GRANULOCYTES: 0 %
Lymphocytes: 57 %
MCH: 28.7 pg (ref 26.6–33.0)
MCHC: 32.2 g/dL (ref 31.5–35.7)
MCV: 89 fL (ref 79–97)
MONOCYTES: 5 %
NEUTROPHILS: 37 %
PLATELET: 217 10*3/uL (ref 150–379)
RBC: 4.6 x10E6/uL (ref 3.77–5.28)
RDW: 13.8 % (ref 12.3–15.4)
WBC: 7.8 10*3/uL (ref 3.4–10.8)

## 2015-05-04 LAB — URINALYSIS W/ RFLX MICROSCOPIC
Bilirubin: NEGATIVE
Blood: NEGATIVE
Glucose: NEGATIVE
Ketone: NEGATIVE
Leukocyte Esterase: NEGATIVE
Nitrites: NEGATIVE
Specific Gravity: 1.021 (ref 1.005–1.030)
Urobilinogen: 1 mg/dL (ref 0.2–1.0)
pH (UA): 6 (ref 5.0–7.5)

## 2015-05-04 LAB — METABOLIC PANEL, COMPREHENSIVE
A-G Ratio: 1.4 (ref 1.1–2.5)
ALT (SGPT): 25 IU/L (ref 0–32)
AST (SGOT): 25 IU/L (ref 0–40)
Albumin: 4.4 g/dL (ref 3.5–5.5)
Alk. phosphatase: 56 IU/L (ref 39–117)
BUN/Creatinine ratio: 18 (ref 9–23)
BUN: 16 mg/dL (ref 6–24)
Bilirubin, total: 0.4 mg/dL (ref 0.0–1.2)
CO2: 23 mmol/L (ref 18–29)
Calcium: 10 mg/dL (ref 8.7–10.2)
Chloride: 98 mmol/L (ref 97–108)
Creatinine: 0.89 mg/dL (ref 0.57–1.00)
GFR est AA: 87 mL/min/{1.73_m2} (ref >59)
GFR est non-AA: 75 mL/min/{1.73_m2} (ref >59)
GLOBULIN, TOTAL: 3.2 g/dL (ref 1.5–4.5)
Glucose: 100 mg/dL — ABNORMAL HIGH (ref 65–99)
Potassium: 4.6 mmol/L (ref 3.5–5.2)
Protein, total: 7.6 g/dL (ref 6.0–8.5)
Sodium: 139 mmol/L (ref 134–144)

## 2015-05-04 LAB — LIPID PANEL
Cholesterol, total: 231 mg/dL — ABNORMAL HIGH (ref 100–199)
HDL Cholesterol: 33 mg/dL — ABNORMAL LOW (ref >39)
LDL, calculated: 167 mg/dL — ABNORMAL HIGH (ref 0–99)
Triglyceride: 156 mg/dL — ABNORMAL HIGH (ref 0–149)
VLDL, calculated: 31 mg/dL (ref 5–40)

## 2015-05-04 LAB — TSH 3RD GENERATION: TSH: 2.02 u[IU]/mL (ref 0.450–4.500)

## 2015-05-04 LAB — CRP, HIGH SENSITIVITY: C-Reactive Protein, Cardiac: 2.78 mg/L (ref 0.00–3.00)

## 2015-05-04 LAB — APOLIPOPROTEIN B: Apolipoprotein B: 153 mg/dL — ABNORMAL HIGH (ref 54–133)

## 2015-05-04 LAB — HEMOGLOBIN A1C WITH EAG
Estimated average glucose: 128 mg/dL
Hemoglobin A1c: 6.1 % — ABNORMAL HIGH (ref 4.8–5.6)

## 2015-06-10 ENCOUNTER — Encounter: Attending: Internal Medicine | Primary: Family Medicine

## 2015-06-19 ENCOUNTER — Encounter

## 2015-07-02 ENCOUNTER — Inpatient Hospital Stay: Payer: MEDICARE | Attending: Orthopaedic Surgery | Primary: Family Medicine

## 2015-07-11 ENCOUNTER — Encounter: Attending: Internal Medicine | Primary: Family Medicine

## 2015-08-27 ENCOUNTER — Encounter: Attending: Family Medicine | Primary: Family Medicine

## 2015-08-29 ENCOUNTER — Ambulatory Visit: Admit: 2015-08-29 | Discharge: 2015-08-29 | Payer: MEDICARE | Attending: Family Medicine | Primary: Family Medicine

## 2015-08-29 DIAGNOSIS — E1129 Type 2 diabetes mellitus with other diabetic kidney complication: Secondary | ICD-10-CM

## 2015-08-29 MED ORDER — ALPRAZOLAM 1 MG TAB
1 mg | ORAL_TABLET | Freq: Two times a day (BID) | ORAL | 0 refills | Status: DC | PRN
Start: 2015-08-29 — End: 2015-10-04

## 2015-08-29 NOTE — Patient Instructions (Signed)
A Healthy Lifestyle: Care Instructions  Your Care Instructions  A healthy lifestyle can help you feel good, stay at a healthy weight, and have plenty of energy for both work and play. A healthy lifestyle is something you can share with your whole family.  A healthy lifestyle also can lower your risk for serious health problems, such as high blood pressure, heart disease, and diabetes.  You can follow a few steps listed below to improve your health and the health of your family.  Follow-up care is a key part of your treatment and safety. Be sure to make and go to all appointments, and call your doctor if you are having problems. It???s also a good idea to know your test results and keep a list of the medicines you take.  How can you care for yourself at home?  ?? Do not eat too much sugar, fat, or fast foods. You can still have dessert and treats now and then. The goal is moderation.  ?? Start small to improve your eating habits. Pay attention to portion sizes, drink less juice and soda pop, and eat more fruits and vegetables.  ?? Eat a healthy amount of food. A 3-ounce serving of meat, for example, is about the size of a deck of cards. Fill the rest of your plate with vegetables and whole grains.  ?? Limit the amount of soda and sports drinks you have every day. Drink more water when you are thirsty.  ?? Eat at least 5 servings of fruits and vegetables every day. It may seem like a lot, but it is not hard to reach this goal. A serving or helping is 1 piece of fruit, 1 cup of vegetables, or 2 cups of leafy, raw vegetables. Have an apple or some carrot sticks as an afternoon snack instead of a candy bar. Try to have fruits and/or vegetables at every meal.  ?? Make exercise part of your daily routine. You may want to start with simple activities, such as walking, bicycling, or slow swimming. Try to be active 30 to 60 minutes every day. You do not need to do all 30 to 60  minutes all at once. For example, you can exercise 3 times a day for 10 or 20 minutes. Moderate exercise is safe for most people, but it is always a good idea to talk to your doctor before starting an exercise program.  ?? Keep moving. Mow the lawn, work in the garden, or clean your house. Take the stairs instead of the elevator at work.  ?? If you smoke, quit. People who smoke have an increased risk for heart attack, stroke, cancer, and other lung illnesses. Quitting is hard, but there are ways to boost your chance of quitting tobacco for good.  ?? Use nicotine gum, patches, or lozenges.  ?? Ask your doctor about stop-smoking programs and medicines.  ?? Keep trying.  In addition to reducing your risk of diseases in the future, you will notice some benefits soon after you stop using tobacco. If you have shortness of breath or asthma symptoms, they will likely get better within a few weeks after you quit.  ?? Limit how much alcohol you drink. Moderate amounts of alcohol (up to 2 drinks a day for men, 1 drink a day for women) are okay. But drinking too much can lead to liver problems, high blood pressure, and other health problems.  Family health  If you have a family, there are many things you can do   together to improve your health.  ?? Eat meals together as a family as often as possible.  ?? Eat healthy foods. This includes fruits, vegetables, lean meats and dairy, and whole grains.  ?? Include your family in your fitness plan. Most people think of activities such as jogging or tennis as the way to fitness, but there are many ways you and your family can be more active. Anything that makes you breathe hard and gets your heart pumping is exercise. Here are some tips:  ?? Walk to do errands or to take your child to school or the bus.  ?? Go for a family bike ride after dinner instead of watching TV.  Where can you learn more?  Go to [employer]   Enter 239 [id number] 2969 in the search box to learn more about "A Healthy Lifestyle: Care Instructions."  ?? 2006-2016 [employer]. Care instructions adapted under license by [employer] (which disclaims liability or warranty for this information). This care instruction is for use with your licensed healthcare professional. If you have questions about a medical condition or this instruction, always ask your healthcare professional. [employer] disclaims any warranty or liability for your use of this information.  Content Version: 10.9.538570; Current as of: November 09, 2014        Numbness and Tingling: Care Instructions  Your Care Instructions  Many things can cause numbness or tingling. Swelling may put pressure on a nerve. This could cause you to lose feeling or have a pins-and-needles sensation on part of your body. Nerves may be damaged from trauma, toxins, or diseases, such as diabetes or multiple sclerosis (MS). Sometimes, though, the cause is not clear.  If there is no clear reason for your symptoms, and you are not having any other symptoms, your doctor may suggest watching and waiting for a while to see if the numbness or tingling goes away on its own. Your doctor may want you to have blood or nerve tests to find the cause of your symptoms.  Follow-up care is a key part of your treatment and safety. Be sure to make and go to all appointments, and call your doctor if you are having problems. It's also a good idea to know your test results and keep a list of the medicines you take.  How can you care for yourself at home?  ?? If your doctor prescribes medicine, take it exactly as directed. Call your doctor if you think you are having a problem with your medicine.  ?? If you have any swelling, put ice or a cold pack on the area for 10 to 20 minutes at a time. Put a thin cloth between the ice and your skin.  When should you call for help?   Call 911 anytime you think you may need emergency care. For example, call if:  ?? You have weakness, numbness, or tingling in both legs.  ?? You lose bowel or bladder control.  ?? You have symptoms of a stroke. These may include:  ?? Sudden numbness, tingling, weakness, or loss of movement in your face, arm, or leg, especially on only one side of your body.  ?? Sudden vision changes.  ?? Sudden trouble speaking.  ?? Sudden confusion or trouble understanding simple statements.  ?? Sudden problems with walking or balance.  ?? A sudden, severe headache that is different from past headaches.  Watch closely for changes in your health, and be sure to contact your doctor if you have any problems, or  if:  ?? You do not get better as expected.  Where can you learn more?  Go to [employer]  Enter U128 in the search box to learn more about "Numbness and Tingling: Care Instructions."  ?? 2006-2016 [employer]. Care instructions adapted under license by [employer] (which disclaims liability or warranty for this information). This care instruction is for use with your licensed healthcare professional. If you have questions about a medical condition or this instruction, always ask your healthcare professional. [employer] disclaims any warranty or liability for your use of this information.  Content Version: 10.9.538570; Current as of: February 08, 2015

## 2015-08-29 NOTE — Progress Notes (Signed)
Subjective:      Mary Mayo is a 49 y.o. female here for today looking to establish care with a new physician. She has not yet found a physician that she has developed a good rapport with.     Diabetes mellitus, type 2: fasting AM BG in the low 100's with a few episodes in the upper 100's, denies hypoglycemic episodes.   ?? HgA1c: 6.1 (5/14/1  ?? Lipid panel: LDL 167 (05/04/15)   ?? Urine microalbumin: microalbuminuria present (08/01/13)   ?? On ACE or ARB: none  ?? On statin: none  ?? On aspirin: none  ?? Diabetic foot exam: due for   ?? Dilated eye exam: 2016 per her report   ?? Pneumococcal vaccine: has not had     ?? Ophthalmologist/Optometrist: [facility]       Hypertension: does not BP at home, reports compliance with clonidine 0.2 mg daily.       Bipolar disorder: she is not under the care of Psychiatry. Reports she is taking Xanax twice daily but she has not had since May 2016.     Additional concerns:   - Headache: located at the back of the skull, intermittent, described as throbbing in quality, no known excerbating factors, episodes self-resolve after 30 min - 1 hour. No associated photophobia, photonsensivity. Denies head trauma   - Intermittent paresthesias of the extremities over the past few weeks       Current Outpatient Prescriptions   Medication Sig Dispense Refill   ??? omeprazole (PRILOSEC) 40 mg capsule      ??? traZODone (DESYREL) 100 mg tablet Take 1 Tab by mouth nightly. 30 Tab 11   ??? omeprazole (PRILOSEC) 10 mg capsule Take 1 Cap by mouth daily. 30 Cap 11   ??? metFORMIN ER (GLUCOPHAGE XR) 500 mg tablet Take 2 Tabs by mouth two (2) times daily (after meals). 120 Tab 11   ??? cloNIDine HCl (CATAPRES) 0.2 mg tablet Take 1 Tab by mouth two (2) times a day. 60 Tab 11   ??? Blood-Glucose Meter monitoring kit by Does Not Apply route. Use as directed   1 Kit 0   ??? Lancets & Blood Glucose Strips Cmpk 1 Units by Does Not Apply route as directed. Monitor BS BID Dx: 250.02 100 Units 5       Allergies    Allergen Reactions   ??? Niaspan [Niacin] Other (comments)     Facial flushing and felt hot   ??? Aspirin Hives and Nausea and Vomiting   ??? Ibuprofen Swelling     Hand and feet swelling       Past Medical History   Diagnosis Date   ??? Arthritis      spinal stenosis    ??? Asthma      last uses inhaler 2004   ??? Bipolar Affective, Depress, Unspec 07/11/2008   ??? Cyst      excision of a chronic abscess of left axilla   ??? Diabetes ([place])      type 2   ??? Dysthymic disorder 08/06/2010   ??? GERD (gastroesophageal reflux disease)    ??? Hypercholesterolemia    ??? Hypertension    ??? Ill-defined condition      vitamin d deficiency   ??? Ill-defined condition      season allergies   ??? Ill-defined condition      high cholesterol   ??? Psychotic disorder      bipolar   ??? Unspecified sleep apnea  last used 5 years ago- discontinued due to weight loss       Social History   Substance Use Topics   ??? Smoking status: Current Every Day Smoker     Packs/day: 0.25     Years: 30.00     Types: Cigarettes   ??? Smokeless tobacco: Never Used   ??? Alcohol use 1.0 oz/week     2 Standard drinks or equivalent per week      Comment: socially          Review of Systems, in addition to HPI:   Constitutional: negative for fevers and chills  Eyes: negative for visual disturbance and irritation  Ears, nose, mouth, throat, and face: negative for nasal congestion and sore throat  Respiratory: negative for cough, sputum or dyspnea on exertion  Cardiovascular: negative for chest pain, chest pressure/discomfort, palpitations, irregular heart beats, lower extremity edema  Gastrointestinal: negative for nausea, vomiting, change in bowel habits and abdominal pain  Genitourinary:negative for frequency, dysuria and hematuria     Objective:     Visit Vitals   ??? BP 128/87   ??? Pulse 82   ??? Temp 98.1 ??F (36.7 ??C) (Oral)   ??? Resp 16   ??? Ht 5\' 2"  (1.575 m)   ??? Wt 206 lb (93.4 kg)   ??? SpO2 98%   ??? BMI 37.68 kg/m2      General appearance - alert, well appearing, and in no distress   Eyes - pupils equal and reactive, extraocular eye movements intact, sclera anicteric  Neck - supple, no significant adenopathy, carotids upstroke normal bilaterally, no bruits, thyroid exam: thyroid is normal in size without nodules or tenderness  Chest - clear to auscultation, no wheezes, rales or rhonchi, symmetric air entry  Heart - normal rate, regular rhythm, normal S1, S2, no murmurs, rubs, clicks or gallops  Neurological - alert, oriented, normal speech, no focal findings or movement disorder noted  Extremities - peripheral pulses normal, no pedal edema, no clubbing or cyanosis    Lab Results   Component Value Date/Time    HEMOGLOBIN A1C 6.1 05/03/2015 12:51 PM       Lab Results   Component Value Date/Time    SODIUM 139 05/03/2015 12:51 PM    POTASSIUM 4.6 05/03/2015 12:51 PM    CHLORIDE 98 05/03/2015 12:51 PM    CO2 23 05/03/2015 12:51 PM    ANION GAP 8 02/06/2014 06:08 AM    GLUCOSE 100 05/03/2015 12:51 PM    BUN 16 05/03/2015 12:51 PM    CREATININE 0.89 05/03/2015 12:51 PM    BUN/CREATININE RATIO 18 05/03/2015 12:51 PM    GFR EST AA 87 05/03/2015 12:51 PM    GFR EST NON-AA 75 05/03/2015 12:51 PM    CALCIUM 10.0 05/03/2015 12:51 PM    BILIRUBIN, TOTAL 0.4 05/03/2015 12:51 PM    ALT 25 05/03/2015 12:51 PM    AST 25 05/03/2015 12:51 PM    ALK. PHOSPHATASE 56 05/03/2015 12:51 PM    PROTEIN, TOTAL 7.6 05/03/2015 12:51 PM    ALBUMIN 4.4 05/03/2015 12:51 PM    GLOBULIN 3.9 01/29/2014 08:55 AM    A-G RATIO 1.4 05/03/2015 12:51 PM       Lab Results   Component Value Date/Time    CHOLESTEROL, TOTAL 231 05/03/2015 12:51 PM    HDL CHOLESTEROL 33 05/03/2015 12:51 PM    LDL, CALCULATED 167 05/03/2015 12:51 PM    VLDL, CALCULATED 31 05/03/2015 12:51 PM    TRIGLYCERIDE  156 05/03/2015 12:51 PM    CHOL/HDL RATIO 8.0 09/13/2009 09:06 AM       Assessment/Plan:   Mary Mayo is a 49 y.o. female seen for:     1. Type 2 diabetes mellitus with microalbuminuria ([place]): good control,  continue with current treatment. Microalbuminuria present and should start ACE or ARB therapy; discuss at next visit. Request recent eye examination.     2. Essential hypertension with goal blood pressure less than 130/85: at goal, continue clonidine therapy.     3. Anxiety: prescription monitoring appropriate and in line with what was discussed today.   - ALPRAZolam (XANAX) 1 mg tablet; Take 1 Tab by mouth two (2) times daily as needed for Anxiety. Max Daily Amount: 2 mg.  Dispense: 60 Tab; Refill: 0    4. Fatigue, unspecified type: check labs as below.   - CBC W/O DIFF  - METABOLIC PANEL, BASIC  - TSH RFX ON ABNORMAL TO FREE T4  - VITAMIN B12 & FOLATE  - VITAMIN D, 25 HYDROXY    5. Tingling in extremities: check labs as below.   - CBC W/O DIFF  - METABOLIC PANEL, BASIC  - TSH RFX ON ABNORMAL TO FREE T4  - VITAMIN B12 & FOLATE  - VITAMIN D, 25 HYDROXY    6. Encounter to establish care    I have discussed the diagnosis with the patient and the intended plan as seen in the above orders. The patient has received an after-visit summary and questions were answered concerning future plans. I have discussed medication side effects and warnings with the patient as well. Patient verbalizes understanding of plan of care and denies further questions or concerns at this time. Informed patient to return to the office if symptoms worsen or if new symptoms arise.    Follow-up Disposition:  Return in about 1 month (around 09/28/2015), or sooner as needed.

## 2015-08-29 NOTE — Progress Notes (Signed)
Chief Complaint   Patient presents with   ??? New Patient     previously saw Dr. Pollie Friar   also saw Dr. Colvin Caroli.  hasnt found a provider she wants to keep   ??? Medication Refill   ??? Numbness     reports numbness in her extremities.  arms, hands, legs, feet     "REVIEWED RECORD IN PREPARATION FOR VISIT AND HAVE OBTAINED THE NECESSARY DOCUMENTATION"  1. Have you been to the ER, urgent care clinic since your last visit?  Hospitalized since your last visit?No    2. Have you seen or consulted any other health care providers outside of the [facility] since your last visit?  Include any pap smears or colon screening. Yes Reason for visit: multiple doctor shopping  Patient does not have advanced directives.

## 2015-09-01 ENCOUNTER — Inpatient Hospital Stay
Admit: 2015-09-01 | Discharge: 2015-09-01 | Disposition: A | Discharge status: Home / Self Care | Payer: MEDICARE | Attending: Emergency Medicine

## 2015-09-01 DIAGNOSIS — T781XXA Other adverse food reactions, not elsewhere classified, initial encounter: Secondary | ICD-10-CM

## 2015-09-01 MED ORDER — METHYLPREDNISOLONE 4 MG TABS IN A DOSE PACK
4 mg | ORAL | 0 refills | Status: DC
Start: 2015-09-01 — End: 2015-10-04

## 2015-09-01 MED ORDER — HYDROCODONE-ACETAMINOPHEN 5 MG-325 MG TAB
5-325 mg | ORAL | Status: AC
Start: 2015-09-01 — End: 2015-09-01
  Administered 2015-09-01: 09:00:00 via ORAL

## 2015-09-01 MED ORDER — DIPHENHYDRAMINE HCL 50 MG/ML IJ SOLN
50 mg/mL | INTRAMUSCULAR | Status: AC
Start: 2015-09-01 — End: 2015-09-01
  Administered 2015-09-01: 07:00:00 via INTRAVENOUS

## 2015-09-01 MED ORDER — METHYLPREDNISOLONE (PF) 125 MG/2 ML IJ SOLR
125 mg/2 mL | INTRAMUSCULAR | Status: AC
Start: 2015-09-01 — End: 2015-09-01
  Administered 2015-09-01: 07:00:00 via INTRAVENOUS

## 2015-09-01 MED ORDER — HYDROXYZINE 50 MG TAB
50 mg | ORAL_TABLET | Freq: Four times a day (QID) | ORAL | 0 refills | Status: DC | PRN
Start: 2015-09-01 — End: 2015-10-04

## 2015-09-01 MED ORDER — SODIUM CHLORIDE 0.9% BOLUS IV
0.9 % | Freq: Once | INTRAVENOUS | Status: AC
Start: 2015-09-01 — End: 2015-09-01
  Administered 2015-09-01: 07:00:00 via INTRAVENOUS

## 2015-09-01 MED ORDER — FAMOTIDINE (PF) 20 MG/2 ML IV
20 mg/2 mL | INTRAVENOUS | Status: AC
Start: 2015-09-01 — End: 2015-09-01
  Administered 2015-09-01: 07:00:00 via INTRAVENOUS

## 2015-09-01 MED ORDER — HYDROXYZINE 50 MG TAB
50 mg | ORAL_TABLET | Freq: Four times a day (QID) | ORAL | 0 refills | Status: DC | PRN
Start: 2015-09-01 — End: 2015-09-01

## 2015-09-01 MED ORDER — HYDROCODONE-ACETAMINOPHEN 5 MG-325 MG TAB
5-325 mg | ORAL_TABLET | ORAL | 0 refills | Status: DC | PRN
Start: 2015-09-01 — End: 2015-10-04

## 2015-09-01 MED ORDER — LORAZEPAM 2 MG/ML IJ SOLN
2 mg/mL | INTRAMUSCULAR | Status: AC
Start: 2015-09-01 — End: 2015-09-01
  Administered 2015-09-01: 07:00:00 via INTRAVENOUS

## 2015-09-01 MED FILL — DIPHENHYDRAMINE HCL 50 MG/ML IJ SOLN: 50 mg/mL | INTRAMUSCULAR | Qty: 1

## 2015-09-01 MED FILL — FAMOTIDINE (PF) 20 MG/2 ML IV: 20 mg/2 mL | INTRAVENOUS | Qty: 2

## 2015-09-01 MED FILL — SOLU-MEDROL (PF) 125 MG/2 ML SOLUTION FOR INJECTION: 125 mg/2 mL | INTRAMUSCULAR | Qty: 2

## 2015-09-01 MED FILL — SODIUM CHLORIDE 0.9 % IV: INTRAVENOUS | Qty: 500

## 2015-09-01 MED FILL — HYDROCODONE-ACETAMINOPHEN 5 MG-325 MG TAB: 5-325 mg | ORAL | Qty: 1

## 2015-09-01 MED FILL — LORAZEPAM 2 MG/ML IJ SOLN: 2 mg/mL | INTRAMUSCULAR | Qty: 1

## 2015-09-01 NOTE — ED Provider Notes (Signed)
HPI Comments: 49 y.o. female with past medical history significant for HTN, Arthritis, Asthma, GERD, DM, Hypercholesterolemia, Tonsillectomy, Hysterectomy and Bipolar disorder who presents from home via EMS with chief complaint of rash. Pt reports acute onset of diffuse, pruritic rash s/p consuming pickles and pickle juice. Per EMS, pt was given Epipen, .3 Epi IV, 50 mg Benadryl and 4 mg Zofran. Pt notes recent hx of similar symptoms s/p eating mustard and peanuts with the use of Benadryl, symptoms resolved. She also reports vomiting while en route. Pt denies chest pain, HA, SOB, nausea, neck pain, back pain or abdominal pain. There are no other acute medical concerns at this time.    Social hx: Smoker ("1 pack, every 4 days"), + Alcohol use, + Marijuana use.  PCP: Ok Edwards, MD    Note written by Terie Purser, Scribe, as dictated by Kathrene Alu, MD 2:36 AM    The history is provided by the patient and the EMS personnel. No language interpreter was used.        Past Medical History:   Diagnosis Date   ??? Arthritis      spinal stenosis    ??? Asthma      last uses inhaler 2004   ??? Bipolar Affective, Depress, Unspec 07/11/2008   ??? Cyst      excision of a chronic abscess of left axilla   ??? Diabetes ([facility])      type 2   ??? Dysthymic disorder 08/06/2010   ??? GERD (gastroesophageal reflux disease)    ??? Hypercholesterolemia    ??? Hypertension    ??? Ill-defined condition      vitamin d deficiency   ??? Ill-defined condition      season allergies   ??? Ill-defined condition      high cholesterol   ??? Psychotic disorder      bipolar   ??? Unspecified sleep apnea      last used 5 years ago- discontinued due to weight loss       Past Surgical History:   Procedure Laterality Date   ??? Hx wisdom teeth extraction     ??? Hx tonsillectomy     ??? Hx gyn  2004     hysterectomy   ??? Hx orthopaedic  12-2012     left foot heel spur         Family History:   Problem Relation Age of Onset   ??? Diabetes Mother    ??? Cancer Mother     ??? Hypertension Mother    ??? Hypertension Father        Social History     Social History   ??? Marital status: DIVORCED     Spouse name: N/A   ??? Number of children: 3   ??? Years of education: 52     Occupational History   ??? Unemployed      Social History Main Topics   ??? Smoking status: Current Every Day Smoker     Packs/day: 0.25     Years: 30.00     Types: Cigarettes   ??? Smokeless tobacco: Never Used   ??? Alcohol use 1.0 oz/week     2 Standard drinks or equivalent per week      Comment: socially   ??? Drug use: Yes     Special: Marijuana      Comment: last used 2 weeks ago   ??? Sexual activity: Not Currently     Other Topics Concern   ???  Not on file     Social History Narrative         ALLERGIES: Niaspan [niacin]; Aspirin; and Ibuprofen    Review of Systems   Respiratory: Negative for shortness of breath.    Cardiovascular: Negative for chest pain.   Gastrointestinal: Positive for vomiting. Negative for abdominal pain and nausea.   Musculoskeletal: Negative for back pain and neck pain.   Skin: Positive for rash (generalized, pruritic).   Neurological: Negative for headaches.   All other systems reviewed and are negative.      Vitals:    09/01/15 0227   Pulse: (!) 123   Resp: 25   Temp: 97.3 ??F (36.3 ??C)   SpO2: 100%   Weight: 90.7 kg (200 lb)   Height: 5\' 2"  (1.575 m)            Physical Exam     CONSTITUTIONAL: Well-appearing; well-nourished; in mild distress  HEAD: Normocephalic; atraumatic  EYES: PERRL; EOM intact; conjunctiva and sclera are clear bilaterally.  ENT: No rhinorrhea; normal pharynx with no tonsillar hypertrophy; mucous membranes pink/moist, no erythema, no exudate.  NECK: Supple; non-tender; no cervical lymphadenopathy  CARD: Normal S1, S2; no murmurs, rubs, or gallops. Regular rate and rhythm.  RESP: Normal respiratory effort; breath sounds clear and equal bilaterally; no wheezes, rhonchi, or rales.  ABD: Normal bowel sounds; non-distended; non-tender; no palpable organomegaly, no masses, no bruits.   Back Exam: Normal inspection; no vertebral point tenderness, no CVA tenderness. Normal range of motion.  EXT: Normal ROM in all four extremities; non-tender to palpation; no swelling or deformity; distal pulses are normal, no edema.  SKIN: Warm; dry; no rash.  NEURO:Alert and oriented x 3, coherent, NII-XII grossly intact, sensory and motor are non-focal.        MDM  Number of Diagnoses or Management Options  Allergic reaction, initial encounter:   Diagnosis management comments: Assessment: allergic reaction/ anxiety-panic attack    Plan: education and reassurance/ symptomatic treatment/ steroid/ Benadryl and Pepcid/ ativan Monitor and Reevaluate.         Amount and/or Complexity of Data Reviewed  Clinical lab tests: ordered and reviewed  Tests in the radiology section of CPT??: ordered and reviewed  Tests in the medicine section of CPT??: reviewed and ordered  Discussion of test results with the performing providers: yes  Decide to obtain previous medical records or to obtain history from someone other than the patient: yes  Obtain history from someone other than the patient: yes  Review and summarize past medical records: yes  Discuss the patient with other providers: yes  Independent visualization of images, tracings, or specimens: yes    Risk of Complications, Morbidity, and/or Mortality  Presenting problems: moderate  Diagnostic procedures: moderate      ED Course       Procedures     Progress Note:   Pt has been reexamined by Kathrene Alu, MD. Pt is feeling much better. Symptoms have improved. All available results have been reviewed with pt and any available family. Pt understands sx, dx, and tx in ED. Care plan has been outlined and questions have been answered. Pt is ready to go home. Will send home on allergic reaction to unknown/ anxiety instructions. Prescription Medrol Dosepak, Atarax, and Norco. Outpatient referral with PCP as needed. Written by Kathrene Alu, MD,6:53 AM    .   .

## 2015-09-01 NOTE — ED Triage Notes (Signed)
Patient arrives from home with c/o allergic reaction, per EMS patient ate pickles and drank pickle juice around 0130. Patient became diaphoretic and dizzy and had hives on face and neck. Patient received Epi IV and IV, zofran, benadryl en route with some relief. Patient A&O x4 and able to speak in full sentences. Patient states she has no previous allergy to pickles in the past.

## 2015-09-09 ENCOUNTER — Encounter: Attending: Family Medicine | Primary: Family Medicine

## 2015-09-16 ENCOUNTER — Encounter: Attending: Family Medicine | Primary: Family Medicine

## 2015-09-26 ENCOUNTER — Encounter: Attending: Family Medicine | Primary: Family Medicine

## 2015-10-03 ENCOUNTER — Encounter: Attending: Family Medicine | Primary: Family Medicine

## 2015-10-04 ENCOUNTER — Ambulatory Visit: Admit: 2015-10-04 | Discharge: 2015-10-04 | Payer: MEDICARE | Attending: Family Medicine | Primary: Family Medicine

## 2015-10-04 DIAGNOSIS — E1129 Type 2 diabetes mellitus with other diabetic kidney complication: Secondary | ICD-10-CM

## 2015-10-04 LAB — AMB POC URINE, MICROALBUMIN, SEMIQUANT (3 RESULTS)
ALBUMIN, URINE POC: 80 mg/L
CREATININE, URINE POC: 300 mg/dL

## 2015-10-04 MED ORDER — OMEPRAZOLE 40 MG CAP, DELAYED RELEASE
40 mg | ORAL_CAPSULE | Freq: Two times a day (BID) | ORAL | 2 refills | Status: DC
Start: 2015-10-04 — End: 2016-04-21

## 2015-10-04 MED ORDER — ALPRAZOLAM 1 MG TAB
1 mg | ORAL_TABLET | Freq: Two times a day (BID) | ORAL | 2 refills | Status: DC | PRN
Start: 2015-10-04 — End: 2016-01-07

## 2015-10-04 MED ORDER — EPINEPHRINE 0.3 MG/0.3 ML IM PEN INJECTOR
0.3 mg/ mL | INJECTION | Freq: Once | INTRAMUSCULAR | 0 refills | Status: DC | PRN
Start: 2015-10-04 — End: 2016-05-27

## 2015-10-04 MED ORDER — LOSARTAN 25 MG TAB
25 mg | ORAL_TABLET | Freq: Every day | ORAL | 5 refills | Status: DC
Start: 2015-10-04 — End: 2016-04-08

## 2015-10-04 MED ORDER — HYDROXYZINE 50 MG TAB
50 mg | ORAL_TABLET | Freq: Four times a day (QID) | ORAL | 2 refills | Status: DC | PRN
Start: 2015-10-04 — End: 2016-05-27

## 2015-10-04 NOTE — Patient Instructions (Signed)
A Healthy Lifestyle: Care Instructions  Your Care Instructions  A healthy lifestyle can help you feel good, stay at a healthy weight, and have plenty of energy for both work and play. A healthy lifestyle is something you can share with your whole family.  A healthy lifestyle also can lower your risk for serious health problems, such as high blood pressure, heart disease, and diabetes.  You can follow a few steps listed below to improve your health and the health of your family.  Follow-up care is a key part of your treatment and safety. Be sure to make and go to all appointments, and call your doctor if you are having problems. It???s also a good idea to know your test results and keep a list of the medicines you take.  How can you care for yourself at home?  ?? Do not eat too much sugar, fat, or fast foods. You can still have dessert and treats now and then. The goal is moderation.  ?? Start small to improve your eating habits. Pay attention to portion sizes, drink less juice and soda pop, and eat more fruits and vegetables.  ?? Eat a healthy amount of food. A 3-ounce serving of meat, for example, is about the size of a deck of cards. Fill the rest of your plate with vegetables and whole grains.  ?? Limit the amount of soda and sports drinks you have every day. Drink more water when you are thirsty.  ?? Eat at least 5 servings of fruits and vegetables every day. It may seem like a lot, but it is not hard to reach this goal. A serving or helping is 1 piece of fruit, 1 cup of vegetables, or 2 cups of leafy, raw vegetables. Have an apple or some carrot sticks as an afternoon snack instead of a candy bar. Try to have fruits and/or vegetables at every meal.  ?? Make exercise part of your daily routine. You may want to start with simple activities, such as walking, bicycling, or slow swimming. Try to be active 30 to 60 minutes every day. You do not need to do all 30 to 60  minutes all at once. For example, you can exercise 3 times a day for 10 or 20 minutes. Moderate exercise is safe for most people, but it is always a good idea to talk to your doctor before starting an exercise program.  ?? Keep moving. Mow the lawn, work in the garden, or clean your house. Take the stairs instead of the elevator at work.  ?? If you smoke, quit. People who smoke have an increased risk for heart attack, stroke, cancer, and other lung illnesses. Quitting is hard, but there are ways to boost your chance of quitting tobacco for good.  ?? Use nicotine gum, patches, or lozenges.  ?? Ask your doctor about stop-smoking programs and medicines.  ?? Keep trying.  In addition to reducing your risk of diseases in the future, you will notice some benefits soon after you stop using tobacco. If you have shortness of breath or asthma symptoms, they will likely get better within a few weeks after you quit.  ?? Limit how much alcohol you drink. Moderate amounts of alcohol (up to 2 drinks a day for men, 1 drink a day for women) are okay. But drinking too much can lead to liver problems, high blood pressure, and other health problems.  Family health  If you have a family, there are many things you can do   together to improve your health.  ?? Eat meals together as a family as often as possible.  ?? Eat healthy foods. This includes fruits, vegetables, lean meats and dairy, and whole grains.  ?? Include your family in your fitness plan. Most people think of activities such as jogging or tennis as the way to fitness, but there are many ways you and your family can be more active. Anything that makes you breathe hard and gets your heart pumping is exercise. Here are some tips:  ?? Walk to do errands or to take your child to school or the bus.  ?? Go for a family bike ride after dinner instead of watching TV.  Where can you learn more?  Go to http://www.healthwise.net/[employer]   Enter U807 in the search box to learn more about "A Healthy Lifestyle: Care Instructions."  ?? 2006-2016 [employer]. Care instructions adapted under license by [employer] (which disclaims liability or warranty for this information). This care instruction is for use with your licensed healthcare professional. If you have questions about a medical condition or this instruction, always ask your healthcare professional. Healthwise, Incorporated disclaims any warranty or liability for your use of this information.  Content Version: 11.0.578772; Current as of: November 09, 2014

## 2015-10-04 NOTE — Progress Notes (Signed)
Chief Complaint   Patient presents with   ??? Medication Refill   ??? Allergic Reaction     recently having allergic reactions to different foods.  [facility]     "REVIEWED RECORD IN PREPARATION FOR VISIT AND HAVE OBTAINED THE NECESSARY DOCUMENTATION"  1. Have you been to the ER, urgent care clinic since your last visit?  Hospitalized since your last visit?Yes Where: see above    2. Have you seen or consulted any other health care providers outside of the [facility] since your last visit?  Include any pap smears or colon screening. No  Patient does not have advanced directives.

## 2015-10-04 NOTE — Telephone Encounter (Signed)
Spoke with pharmacist.  Dr. Dayton ScrapeMurray states if pt not able to cover cost of Atarax then suggests Benadryl OTC for itching. Pharmacist to notify patient.

## 2015-10-04 NOTE — Progress Notes (Signed)
Subjective:      Mary Mayo is a 49 y.o. female here for today for anxiety follow up and medication refills. Patient seen at [facility] on 09/01/15 for allergic reaction after consuming pickles for which she has consumed her entire life. Has completed course of methylprednisolone. Given hydroxyzine to use as needed and Norco for pain. Reports that she is having reactions to multiple foods (hives and itching) which she has never had issues with. She has been taking hydroxyzine each morning to stay ahead of any possible reaction.     Current Outpatient Prescriptions   Medication Sig Dispense Refill   ??? hydrOXYzine (ATARAX) 50 mg tablet Take 1 Tab by mouth every six (6) hours as needed for Itching. 30 Tab 0   ??? omeprazole (PRILOSEC) 40 mg capsule      ??? ALPRAZolam (XANAX) 1 mg tablet Take 1 Tab by mouth two (2) times daily as needed for Anxiety. Max Daily Amount: 2 mg. 60 Tab 0   ??? traZODone (DESYREL) 100 mg tablet Take 1 Tab by mouth nightly. 30 Tab 11   ??? omeprazole (PRILOSEC) 10 mg capsule Take 1 Cap by mouth daily. 30 Cap 11   ??? metFORMIN ER (GLUCOPHAGE XR) 500 mg tablet Take 2 Tabs by mouth two (2) times daily (after meals). 120 Tab 11   ??? cloNIDine HCl (CATAPRES) 0.2 mg tablet Take 1 Tab by mouth two (2) times a day. 60 Tab 11   ??? Blood-Glucose Meter monitoring kit by Does Not Apply route. Use as directed   1 Kit 0   ??? Lancets & Blood Glucose Strips Cmpk 1 Units by Does Not Apply route as directed. Monitor BS BID Dx: 250.02 100 Units 5       Allergies   Allergen Reactions   ??? Niaspan [Niacin] Other (comments)     Facial flushing and felt hot   ??? Aspirin Hives and Nausea and Vomiting   ??? Ibuprofen Swelling     Hand and feet swelling       Past Medical History   Diagnosis Date   ??? Arthritis      spinal stenosis    ??? Asthma      last uses inhaler 2004   ??? Bipolar Affective, Depress, Unspec 07/11/2008   ??? Cyst      excision of a chronic abscess of left axilla   ??? Diabetes ([place])      type 2    ??? Dysthymic disorder 08/06/2010   ??? GERD (gastroesophageal reflux disease)    ??? Hypercholesterolemia    ??? Hypertension    ??? Ill-defined condition      vitamin d deficiency   ??? Ill-defined condition      season allergies   ??? Ill-defined condition      high cholesterol   ??? Psychotic disorder      bipolar   ??? Unspecified sleep apnea      last used 5 years ago- discontinued due to weight loss       Social History   Substance Use Topics   ??? Smoking status: Current Every Day Smoker     Packs/day: 0.25     Years: 30.00     Types: Cigarettes   ??? Smokeless tobacco: Never Used   ??? Alcohol use 1.0 oz/week     2 Standard drinks or equivalent per week      Comment: socially        Review of Systems  Respiratory: negative for cough,  sputum or dyspnea on exertion  Cardiovascular: negative for chest pain, chest pressure/discomfort, palpitations, irregular heart beats, lower extremity edema  Gastrointestinal: negative for nausea, vomiting, change in bowel habits and abdominal pain       Objective:     Vitals:    10/04/15 0806 10/04/15 0848   BP: (!) 166/107 134/84   Pulse: 96    Resp: 18    Temp: 98.2 ??F (36.8 ??C)    TempSrc: Oral    SpO2: 98%    Weight: 208 lb (94.3 kg)    Height: 5\' 2"  (1.575 m)       General appearance - alert, well appearing, and in no distress  Eyes - pupils equal and reactive, extraocular eye movements intact, sclera anicteric  Chest - clear to auscultation, no wheezes, rales or rhonchi, symmetric air entry  Heart - normal rate, regular rhythm, normal S1, S2, no murmurs, rubs, clicks or gallops  Neurological - alert, oriented, normal speech, no focal findings or movement disorder noted  Skin - normal coloration and turgor    Diabetic foot exam:     Left: Proprioception normal   Filament test normal sensation with micro filament   Pulse DP: 2+ (normal)   Pulse PT: 2+ (normal)   Deformities: None    Right: Proprioception normal   Filament test normal sensation with micro filament   Pulse DP: 2+ (normal)    Pulse PT: 2+ (normal)   Deformities: None      Recent Results (from the past 12 hour(s))   AMB POC URINE, MICROALBUMIN, SEMIQUANT (3 RESULTS)    Collection Time: 10/04/15  8:30 AM   Result Value Ref Range    ALBUMIN, URINE POC 80 Negative mg/L    CREATININE, URINE POC 300 mg/dL    Microalbumin/creat ratio (POC) 30-300 mg/g       Assessment/Plan:   Mary Mayo is a 49 y.o. female seen for:     1. Type 2 diabetes mellitus with microalbuminuria, without long-term current use of insulin (HCC): on ARB therapy. Labs today.   - AMB POC URINE, MICROALBUMIN, SEMIQUANT (3 RESULTS)  - HM DIABETES FOOT EXAM  - losartan (COZAAR) 25 mg tablet; Take 1 Tab by mouth daily.  Dispense: 30 Tab; Refill: 5    2. Essential hypertension, benign: repeat BP at goal. No change to current therapy. Labs today.   - losartan (COZAAR) 25 mg tablet; Take 1 Tab by mouth daily.  Dispense: 30 Tab; Refill: 5    3. Allergic reaction to food: increasing reported reactions to foods. Feels as though hydroxyzine works better for her than Benadryl dose. Epi pen prescribed and instructed on when and how to use; if she needs to use then she should seek immediate medical attention. Refer to Allergy for possible allergy testing.   - REFERRAL TO ALLERGY  - hydrOXYzine (ATARAX) 50 mg tablet; Take 1 Tab by mouth every six (6) hours as needed for Itching.  Dispense: 90 Tab; Refill: 2  - EPINEPHrine (EPIPEN 2-PAK) 0.3 mg/0.3 mL injection; 0.3 mL by IntraMUSCular route once as needed for up to 1 dose. If used, seek immediate medical attention.  Dispense: 2 Syringe; Refill: 0    4. Anxiety:   - ALPRAZolam (XANAX) 1 mg tablet; Take 1 Tab by mouth two (2) times daily as needed for Anxiety. Max Daily Amount: 2 mg.  Dispense: 60 Tab; Refill: 2    5. Gastroesophageal reflux disease, esophagitis presence not specified:   - omeprazole (PRILOSEC)  40 mg capsule; Take 1 Cap by mouth two (2) times a day.  Dispense: 60 Cap; Refill: 2     I have discussed the diagnosis with the patient and the intended plan as seen in the above orders. The patient has received an after-visit summary and questions were answered concerning future plans. I have discussed medication side effects and warnings with the patient as well. Patient verbalizes understanding of plan of care and denies further questions or concerns at this time. Informed patient to return to the office if symptoms worsen or if new symptoms arise.    Follow-up Disposition:  Return in about 3 months (around 01/04/2016) for anxiety follow up or sooner as needed.

## 2015-10-04 NOTE — Telephone Encounter (Signed)
Pharmacy is calling and stated that hydroxicine is not covered with her insurance.  They are asking if Dr. Dayton ScrapeMurray will consider something different.  The young lady that called stated the her system was not showing an alternative.  Please either send over new script or call at [phone] to advise.

## 2015-12-24 ENCOUNTER — Encounter

## 2015-12-30 ENCOUNTER — Encounter: Attending: Family Medicine | Primary: Family Medicine

## 2016-01-01 ENCOUNTER — Encounter: Attending: Family Medicine | Primary: Family Medicine

## 2016-01-07 ENCOUNTER — Ambulatory Visit: Admit: 2016-01-07 | Discharge: 2016-01-07 | Payer: MEDICARE | Attending: Family | Primary: Family Medicine

## 2016-01-07 ENCOUNTER — Encounter: Attending: Family Medicine | Primary: Family Medicine

## 2016-01-07 DIAGNOSIS — I10 Essential (primary) hypertension: Secondary | ICD-10-CM

## 2016-01-07 MED ORDER — ALPRAZOLAM 1 MG TAB
1 mg | ORAL_TABLET | Freq: Two times a day (BID) | ORAL | 2 refills | Status: DC | PRN
Start: 2016-01-07 — End: 2016-03-30

## 2016-01-07 NOTE — Patient Instructions (Addendum)
Mediterranean Diet: Care Instructions  Your Care Instructions  The Mediterranean diet features foods eaten in [place], [place], southern [place] and [place], and other countries that border the [place] Sea. It emphasizes eating a diet rich in fruits, vegetables, nuts, and high-fiber grains, and limits meat, cheese, and sweets.  The Mediterranean diet may:  ?? Prevent heart disease and lower the risk of a heart attack or stroke.  ?? Prevent type 2 diabetes.  ?? Prevent Alzheimer's disease and other dementia.  ?? Prevent depression.  ?? Prevent Parkinson's disease.  This diet contains more fat than other heart-healthy diets. But the fats are mainly from nuts, unsaturated oils, such as fish oils, olive oil, and certain nut or seed oils (such as canola, soybean, or flaxseed oil). These types of oils may help protect the heart and blood vessels.  Follow-up care is a key part of your treatment and safety. Be sure to make and go to all appointments, and call your doctor if you are having problems. It's also a good idea to know your test results and keep a list of the medicines you take.  How can you care for yourself at home?  What to eat  ?? Eat a variety of fruits and vegetables each day, such as grapes, blueberries, tomatoes, broccoli, peppers, figs, olives, spinach, eggplant, beans, lentils, and chickpeas.  ?? Eat a variety of whole-grain foods each day, such as oats, brown rice, and whole wheat bread, pasta, and couscous.  ?? Eat fish at least 2 times a week. Try tuna, salmon, mackerel, lake trout, herring, or sardines.  ?? Eat moderate amounts of low-fat dairy products each day or weekly, such as milk, cheese, or yogurt.  ?? Eat moderate amounts of poultry and eggs every 2 days or weekly.  ?? Choose healthy (unsaturated) fats, such as nuts, olive oil and certain nut or seed oils like canola, soybean, and flaxseed.  ?? Limit unhealthy (saturated) fats, such as butter, palm oil, and coconut  oil. And limit fats found in animal products, such as meat and dairy products made with whole milk. Try to eat red meat only a few times a month in very small amounts.  ?? Limit sweets and desserts to only a few times a week. This includes sugar-sweetened drinks like soda.  The Mediterranean diet may also include red wine with your meal???1 glass each day for women and up to 2 glasses a day for men.  Tips for changing your diet  ?? Dip bread in a mix of olive oil and fresh herbs instead of using butter.  ?? Add avocado slices to your sandwich instead of bacon.  ?? Have fish for lunch or dinner instead of red meat. Brush the fish with olive oil, and broil or grill it.  ?? Sprinkle your salad with seeds or nuts instead of cheese.  ?? Cook with olive or canola oil instead of butter or oils that are high in saturated fat.  ?? Switch from 2% milk or whole milk to 1% or fat-free milk.  ?? Dip raw vegetables in a vinaigrette dressing or hummus instead of dips made from mayonnaise or sour cream.  ?? Have a piece of fruit for dessert instead of a piece of cake. Try baked apples, or have some dried fruit.  Part of the Mediterranean diet is being active. Get at least 30 minutes of exercise on most days of the week. Walking is a good choice. You also may want to do other activities, such as   running, swimming, cycling, or playing tennis or team sports.  Where can you learn more?  Go to http://www.healthwise.net/[employer].  Enter O407 in the search box to learn more about "Mediterranean Diet: Care Instructions."  Current as of: July 16, 2015  Content Version: 11.1  ?? 2006-2016 Healthwise, Incorporated. Care instructions adapted under license by [employer] (which disclaims liability or warranty for this information). If you have questions about a medical condition or this instruction, always ask your healthcare professional. Healthwise, Incorporated disclaims any warranty or liability for your use of this information.

## 2016-01-07 NOTE — Progress Notes (Signed)
Subjective:     Mary Mayo is a 50 y.o. female who presents for follow up of diabetes, hypertension and hyperlipidemia.    Diet and Lifestyle: generally follows a low fat low cholesterol diet  Home BP Monitoring: is not measured at home    Cardiovascular ROS: taking medications as instructed, no medication side effects noted, no TIA's, no chest pain on exertion, no dyspnea on exertion, no swelling of ankles.     New concerns: Pt states that she is here for follow up of BP, in addition to a couple of concerns.  Pt is fasting today, and will get labs.    Pt states that her body has not been feeling "right".  She has been having headaches, more frequently then normal.  Pt states that this month she has had a headache every day.  She has never checked her BP when she has a headache.  Normally, the headache is noted in the back of her head, but sometimes it is in the front of her head.  No visual changes with the head, not sound or smell sensitive with the headaches.  No nausea, no vomiting with the headaches.  Pt states that she takes Tylenol for the headaches, and it usually takes the headache away.  Sometimes in the front of the head.  She has been having muscle spasms in her neck, but stretching helps them when they are present.    Patient Active Problem List    Diagnosis Date Noted   ??? Frequent headaches 01/07/2016   ??? Screening for thyroid disorder 01/07/2016   ??? Screening for colon cancer 01/07/2016   ??? Cervical stenosis of spine 02/05/2014   ??? Spinal stenosis 02/05/2014   ??? Tobacco use disorder 04/29/2012   ??? Unspecified vitamin D deficiency 03/31/2012   ??? Lumbar spondylosis 06/01/2011   ??? Microalbuminuria 04/30/2011   ??? Encounter for long-term (current) use of other medications 04/01/2011   ??? Hidradenitis suppurativa 12/04/2010   ??? Dysthymic disorder 08/06/2010   ??? Mixed hyperlipidemia 01/10/2010   ??? Migraine with aura, without mention of intractable migraine without  mention of status migrainosus 11/12/2009   ??? Allergic rhinitis due to other allergen 10/11/2009   ??? Cyst    ??? Morbid obesity ([place]) 07/05/2009   ??? Unspecified sleep apnea 06/14/2009   ??? Tachycardia, unspecified 03/04/2009   ??? Type 2 diabetes mellitus without complication ([place]) 26/71/2458   ??? Urge incontinence 10/31/2008   ??? Esophageal reflux 07/11/2008   ??? Lumbago 07/11/2008   ??? Essential hypertension, benign 07/11/2008   ??? Bipolar 1 disorder ([place]) 07/11/2008     Current Outpatient Prescriptions   Medication Sig Dispense Refill   ??? ALPRAZolam (XANAX) 1 mg tablet Take 1 Tab by mouth two (2) times daily as needed for Anxiety. Max Daily Amount: 2 mg. 60 Tab 2   ??? hydrOXYzine (ATARAX) 50 mg tablet Take 1 Tab by mouth every six (6) hours as needed for Itching. 90 Tab 2   ??? EPINEPHrine (EPIPEN 2-PAK) 0.3 mg/0.3 mL injection 0.3 mL by IntraMUSCular route once as needed for up to 1 dose. If used, seek immediate medical attention. 2 Syringe 0   ??? losartan (COZAAR) 25 mg tablet Take 1 Tab by mouth daily. 30 Tab 5   ??? omeprazole (PRILOSEC) 40 mg capsule Take 1 Cap by mouth two (2) times a day. 60 Cap 2   ??? traZODone (DESYREL) 100 mg tablet Take 1 Tab by mouth nightly. 30 Tab 11   ???  metFORMIN ER (GLUCOPHAGE XR) 500 mg tablet Take 2 Tabs by mouth two (2) times daily (after meals). 120 Tab 11   ??? cloNIDine HCl (CATAPRES) 0.2 mg tablet Take 1 Tab by mouth two (2) times a day. 60 Tab 11   ??? Blood-Glucose Meter monitoring kit by Does Not Apply route. Use as directed   1 Kit 0   ??? Lancets & Blood Glucose Strips Cmpk 1 Units by Does Not Apply route as directed. Monitor BS BID Dx: 250.02 100 Units 5     Allergies   Allergen Reactions   ??? Niaspan [Niacin] Other (comments)     Facial flushing and felt hot   ??? Aspirin Hives and Nausea and Vomiting   ??? Ibuprofen Swelling     Hand and feet swelling     Past Medical History   Diagnosis Date   ??? Arthritis      spinal stenosis    ??? Asthma      last uses inhaler 2004    ??? Bipolar Affective, Depress, Unspec 07/11/2008   ??? Cyst      excision of a chronic abscess of left axilla   ??? Diabetes ([place])      type 2   ??? Dysthymic disorder 08/06/2010   ??? GERD (gastroesophageal reflux disease)    ??? Hypercholesterolemia    ??? Hypertension    ??? Ill-defined condition      vitamin d deficiency   ??? Ill-defined condition      season allergies   ??? Ill-defined condition      high cholesterol   ??? Psychotic disorder      bipolar   ??? Unspecified sleep apnea      last used 5 years ago- discontinued due to weight loss     Past Surgical History   Procedure Laterality Date   ??? Hx wisdom teeth extraction     ??? Hx tonsillectomy     ??? Hx gyn  2004     hysterectomy   ??? Hx orthopaedic  12-2012     left foot heel spur     Family History   Problem Relation Age of Onset   ??? Diabetes Mother    ??? Cancer Mother    ??? Hypertension Mother    ??? Hypertension Father      Social History   Substance Use Topics   ??? Smoking status: Current Every Day Smoker     Packs/day: 0.25     Years: 30.00     Types: Cigarettes   ??? Smokeless tobacco: Never Used   ??? Alcohol use 1.0 oz/week     2 Standard drinks or equivalent per week      Comment: socially        Lab Results  Component Value Date/Time   WBC 7.8 05/03/2015 12:51 PM   HGB 13.2 05/03/2015 12:51 PM   HCT 41.0 05/03/2015 12:51 PM   PLATELET 217 05/03/2015 12:51 PM   MCV 89 05/03/2015 12:51 PM       Lab Results  Component Value Date/Time   Hemoglobin A1c 6.1 05/03/2015 12:51 PM   Hemoglobin A1c 7.1 02/05/2014 02:05 PM   Hemoglobin A1c 7.0 01/29/2014 08:55 AM   Glucose 100 05/03/2015 12:51 PM   Glucose (POC) 163 02/07/2014 07:39 AM   Glucose POC 274 12/12/2009 08:42 AM   Microalbumin/Creat ratio (mg/g creat) 16 12/04/2008 08:40 AM   Microalb/Creat ratio (ug/mg creat.) 309.4 07/31/2013 09:08 AM   Microalbumin,urine random 2.69 12/04/2008 08:40 AM  LDL, calculated 167 05/03/2015 12:51 PM   Creatinine 0.89 05/03/2015 12:51 PM      Lab Results  Component Value Date/Time    Cholesterol, total 231 05/03/2015 12:51 PM   HDL Cholesterol 33 05/03/2015 12:51 PM   LDL, calculated 167 05/03/2015 12:51 PM   Triglyceride 156 05/03/2015 12:51 PM   CHOL/HDL Ratio 8.0 09/13/2009 09:06 AM       Lab Results  Component Value Date/Time   GFR est AA 87 05/03/2015 12:51 PM   GFR est non-AA 75 05/03/2015 12:51 PM   Creatinine 0.89 05/03/2015 12:51 PM   BUN 16 05/03/2015 12:51 PM   Sodium 139 05/03/2015 12:51 PM   Potassium 4.6 05/03/2015 12:51 PM   Chloride 98 05/03/2015 12:51 PM   CO2 23 05/03/2015 12:51 PM         Review of Systems, additional:  Pertinent items are noted in HPI.    Objective:     Visit Vitals   ??? BP 122/78   ??? Pulse (!) 106   ??? Temp 98.2 ??F (36.8 ??C) (Oral)   ??? Resp 16   ??? Ht '5\' 2"'$  (1.575 m)   ??? Wt 214 lb (97.1 kg)   ??? SpO2 97%   ??? BMI 39.14 kg/m2     Appearance: alert, well appearing, and in no distress.  General exam: [facility] exam BP noted to be well controlled today in office, S1, S2 normal, no gallop, no murmur, chest clear, no JVD, no HSM, no edema, peripheral vascular exam both carotids normal upstroke without bruits.  Lab review: orders written for new lab studies as appropriate; see orders.     Assessment/Plan:     diabetes control uncertain, hypertension stable, hyperlipidemia control uncertain.  current treatment plan is effective, no change in therapy  orders and follow up as documented in patient record  reviewed diet, exercise and weight control.     ICD-10-CM ICD-9-CM    1. Essential hypertension, benign I10 401.1 CBC W/O DIFF      METABOLIC PANEL, COMPREHENSIVE      THYROID CASCADE PROFILE   2. Type 2 diabetes mellitus without complication, without long-term current use of insulin (HCC) E11.9 250.00 HEMOGLOBIN A1C WITH EAG   3. Anxiety F41.9 300.00 ALPRAZolam (XANAX) 1 mg tablet   4. Mixed hyperlipidemia E78.2 272.2 LIPID PANEL   5. Frequent headaches R51 784.0 REFERRAL TO NEUROLOGY   6. Screening for thyroid disorder Z13.29 V77.0     7. Screening for colon cancer Z12.11 V76.51 OCCULT BLOOD, STOOL     Informed patient that we will notify her when her labs return.  Educated about calling neurology for appointment ASAP, for continued headaches.  Educated about focusing on diet and lifestyle changes, to lose weight.    Pt informed to return to office with worsening of symptoms, or PRN with any questions or concerns.  Pt verbalizes understanding of plan of care and denies further questions or concerns at this time.

## 2016-01-07 NOTE — Progress Notes (Signed)
Identified pt with two pt identifiers(name and DOB).    Chief Complaint   Patient presents with   ??? Headache     everyday    ??? Stiff Neck   ??? Hypertension     follow up pt is fasting        Health Maintenance Due   Topic   ??? Pneumococcal 19-64 Medium Risk (1 of 1 - PPSV23)   ??? DTaP/Tdap/Td series (1 - Tdap)   ??? EYE EXAM RETINAL OR DILATED Q1    ??? FOBT Q 1 YEAR AGE 52-75    ??? PAP AKA CERVICAL CYTOLOGY    ??? HEMOGLOBIN A1C Q6M        Wt Readings from Last 3 Encounters:   01/07/16 214 lb (97.1 kg)   10/04/15 208 lb (94.3 kg)   09/01/15 200 lb (90.7 kg)     Temp Readings from Last 3 Encounters:   01/07/16 98.2 ??F (36.8 ??C) (Oral)   10/04/15 98.2 ??F (36.8 ??C) (Oral)   09/01/15 97.3 ??F (36.3 ??C)     BP Readings from Last 3 Encounters:   01/07/16 122/78   10/04/15 134/84   09/01/15 135/89     Pulse Readings from Last 3 Encounters:   01/07/16 (!) 106   10/04/15 96   09/01/15 100         Learning Assessment:  :     Learning Assessment 04/26/2013 11/25/2012   PRIMARY LEARNER Patient Patient   HIGHEST LEVEL OF EDUCATION - PRIMARY LEARNER  SOME COLLEGE 2 YEARS OF COLLEGE   BARRIERS PRIMARY LEARNER NONE NONE   CO-LEARNER CAREGIVER No No   PRIMARY LANGUAGE ENGLISH ENGLISH   INTERPRETER NEED - No   LEARNER PREFERENCE PRIMARY LISTENING LISTENING   LEARNING SPECIAL TOPICS - no   ANSWERED BY self patient   RELATIONSHIP SELF SELF           Abuse Screening:  :     Abuse Screening Questionnaire 04/26/2013   Do you ever feel afraid of your partner? N   Are you in a relationship with someone who physically or mentally threatens you? N   Is it safe for you to go home? Y       Coordination of Care Questionnaire:  :     1) Have you been to an emergency room, urgent care clinic since your last visit? no   Hospitalized since your last visit? no             2) Have you seen or consulted any other health care providers outside of [facility] since your last visit? no  (Include any pap smears or colon screenings in this section.)     3) Do you have an Advance Directive on file? no  Are you interested in receiving information about Advance Directives? no    Patient is accompanied by self I have received verbal consent from Bary Richard to discuss any/all medical information while they are present in the room.    Reviewed record in preparation for visit and have obtained necessary documentation.  Medication reconciliation up to date and corrected with patient at this time.

## 2016-01-08 ENCOUNTER — Encounter

## 2016-01-08 LAB — METABOLIC PANEL, COMPREHENSIVE
A-G Ratio: 1.3 (ref 1.1–2.5)
ALT (SGPT): 29 IU/L (ref 0–32)
AST (SGOT): 24 IU/L (ref 0–40)
Albumin: 4.3 g/dL (ref 3.5–5.5)
Alk. phosphatase: 65 IU/L (ref 39–117)
BUN/Creatinine ratio: 16 (ref 9–23)
BUN: 13 mg/dL (ref 6–24)
Bilirubin, total: 0.3 mg/dL (ref 0.0–1.2)
CO2: 24 mmol/L (ref 18–29)
Calcium: 9.8 mg/dL (ref 8.7–10.2)
Chloride: 99 mmol/L (ref 96–106)
Creatinine: 0.82 mg/dL (ref 0.57–1.00)
GFR est AA: 96 mL/min/{1.73_m2} (ref >59)
GFR est non-AA: 83 mL/min/{1.73_m2} (ref >59)
GLOBULIN, TOTAL: 3.2 g/dL (ref 1.5–4.5)
Glucose: 157 mg/dL — ABNORMAL HIGH (ref 65–99)
Potassium: 4.5 mmol/L (ref 3.5–5.2)
Protein, total: 7.5 g/dL (ref 6.0–8.5)
Sodium: 138 mmol/L (ref 134–144)

## 2016-01-08 LAB — HEMOGLOBIN A1C WITH EAG
Estimated average glucose: 151 mg/dL
Hemoglobin A1c: 6.9 % — ABNORMAL HIGH (ref 4.8–5.6)

## 2016-01-08 LAB — CBC W/O DIFF
HCT: 41 % (ref 34.0–46.6)
HGB: 13.3 g/dL (ref 11.1–15.9)
MCH: 29.2 pg (ref 26.6–33.0)
MCHC: 32.4 g/dL (ref 31.5–35.7)
MCV: 90 fL (ref 79–97)
PLATELET: 203 10*3/uL (ref 150–379)
RBC: 4.55 x10E6/uL (ref 3.77–5.28)
RDW: 13.7 % (ref 12.3–15.4)
WBC: 6 10*3/uL (ref 3.4–10.8)

## 2016-01-08 LAB — THYROID CASCADE PROFILE: TSH: 2.45 u[IU]/mL (ref 0.450–4.500)

## 2016-01-08 LAB — LIPID PANEL
Cholesterol, total: 221 mg/dL — ABNORMAL HIGH (ref 100–199)
HDL Cholesterol: 31 mg/dL — ABNORMAL LOW (ref >39)
LDL, calculated: 154 mg/dL — ABNORMAL HIGH (ref 0–99)
Triglyceride: 181 mg/dL — ABNORMAL HIGH (ref 0–149)
VLDL, calculated: 36 mg/dL (ref 5–40)

## 2016-01-08 LAB — CVD REPORT

## 2016-01-08 MED ORDER — ATORVASTATIN 10 MG TAB
10 mg | ORAL_TABLET | Freq: Every day | ORAL | 1 refills | Status: DC
Start: 2016-01-08 — End: 2016-07-16

## 2016-01-08 NOTE — Progress Notes (Signed)
Please call patient and let her know that her labs returned:  1.  Glucose is high, and HgbA1C has increased.  She needs to focus on diabetic diet, as discussed in office visit.  She should return in 3 months for repeat of HgbA1C and should it still be increased, will increase Metformin dose.  2.  Cholesterol is high!  She needs to take a cholesterol medication, to decrease her risk of heart attack and/or stroke.  [facility] and she should take as prescribed.  She should return to office with any negative side effects, most common side effect are muscle aches.  Everything else was normal!  Thanks!

## 2016-01-08 NOTE — Telephone Encounter (Signed)
-----   Message from Sylvie Farrier, NP sent at 01/08/2016  1:20 PM EST -----  Please call patient and let her know that her labs returned:  1.  Glucose is high, and HgbA1C has increased.  She needs to focus on diabetic diet, as discussed in office visit.  She should return in 3 months for repeat of HgbA1C and should it still be increased, will increase Metformin dose.  2.  Cholesterol is high!  She needs to take a cholesterol medication, to decrease her risk of heart attack and/or stroke.  [facility] and she should take as prescribed.  She should return to office with any negative side effects, most common side effect are muscle aches.  Everything else was normal!  Thanks!

## 2016-01-08 NOTE — Telephone Encounter (Signed)
See note below.  Advised pt lab results & recommendations per Ruby Cola, FNP.  Pt aware Rx Lipitor was sent to pharmacy & to return to office in 3 months for a fasting follow up.

## 2016-01-13 NOTE — Telephone Encounter (Signed)
Patient called and stated that she is not able to get appt with neurology until 1/30.  She has tremendous pain in both of her arms and shoulders.  She is asking if something can be called in for pain.  Any questions she can be reached at [phone]

## 2016-01-13 NOTE — Telephone Encounter (Signed)
See note below.  LOV 01/07/16 order placed to see Dr. Peri Maris.  Please advise.

## 2016-01-13 NOTE — Telephone Encounter (Signed)
See notes below.  advised pt to to try OTC tylenol for arm & shoulder pain.  Pt states she has tried OTC tylenol with no relief.  Pt states is allergic to Aspirin & Ibuprofen.  Please advise.

## 2016-01-13 NOTE — Telephone Encounter (Signed)
Pt can take OTC Ibuprofen for her pain, until she is assessed by specialist.  Thanks!

## 2016-01-14 NOTE — Telephone Encounter (Signed)
Pt would need an appointment.  I don't even have this documented from our last visit.  She can make an appointment and we can discuss the pain more in depth.  Thanks!

## 2016-01-14 NOTE — Telephone Encounter (Signed)
See notes below.  advised pt she will need an apt for evaluation offered pt apt for today but pt states she will call back & schedule apt due to transportation.

## 2016-01-17 ENCOUNTER — Ambulatory Visit: Admit: 2016-01-17 | Discharge: 2016-01-17 | Payer: MEDICARE | Attending: Family | Primary: Family Medicine

## 2016-01-17 ENCOUNTER — Emergency Department: Admit: 2016-01-17 | Payer: MEDICARE | Primary: Family Medicine

## 2016-01-17 ENCOUNTER — Inpatient Hospital Stay
Admit: 2016-01-17 | Discharge: 2016-01-18 | Disposition: A | Discharge status: Home / Self Care | Payer: MEDICARE | Attending: Emergency Medicine

## 2016-01-17 DIAGNOSIS — M79602 Pain in left arm: Secondary | ICD-10-CM

## 2016-01-17 DIAGNOSIS — J069 Acute upper respiratory infection, unspecified: Secondary | ICD-10-CM

## 2016-01-17 LAB — D-DIMER, QUANTITATIVE: D-Dimer, Quant: 1.23 mg/L FEU — ABNORMAL HIGH (ref 0.00–0.65)

## 2016-01-17 LAB — CBC WITH AUTOMATED DIFF
ABS. BASOPHILS: 0 10*3/uL (ref 0.0–0.1)
ABS. EOSINOPHILS: 0.1 10*3/uL (ref 0.0–0.4)
ABS. LYMPHOCYTES: 2.7 10*3/uL (ref 0.8–3.5)
ABS. MONOCYTES: 0.4 10*3/uL (ref 0.0–1.0)
ABS. NEUTROPHILS: 3.3 10*3/uL (ref 1.8–8.0)
BASOPHILS: 0 % (ref 0–1)
EOSINOPHILS: 2 % (ref 0–7)
HCT: 39.3 % (ref 35.0–47.0)
HGB: 12.3 g/dL (ref 11.5–16.0)
LYMPHOCYTES: 41 % (ref 12–49)
MCH: 28.3 PG (ref 26.0–34.0)
MCHC: 31.3 g/dL (ref 30.0–36.5)
MCV: 90.6 FL (ref 80.0–99.0)
MONOCYTES: 7 % (ref 5–13)
NEUTROPHILS: 50 % (ref 32–75)
PLATELET: 206 10*3/uL (ref 150–400)
RBC: 4.34 M/uL (ref 3.80–5.20)
RDW: 13.5 % (ref 11.5–14.5)
WBC: 6.5 10*3/uL (ref 3.6–11.0)

## 2016-01-17 LAB — METABOLIC PANEL, BASIC
Anion gap: 10 mmol/L (ref 5–15)
BUN/Creatinine ratio: 11 — ABNORMAL LOW (ref 12–20)
BUN: 11 MG/DL (ref 6–20)
CO2: 27 mmol/L (ref 21–32)
Calcium: 8.9 MG/DL (ref 8.5–10.1)
Chloride: 102 mmol/L (ref 97–108)
Creatinine: 1 MG/DL (ref 0.55–1.02)
GFR est AA: >60 mL/min/{1.73_m2} (ref >60)
GFR est non-AA: 58 mL/min/{1.73_m2} — ABNORMAL LOW (ref >60)
Glucose: 180 mg/dL — ABNORMAL HIGH (ref 65–100)
Potassium: 3.9 mmol/L (ref 3.5–5.1)
Sodium: 139 mmol/L (ref 136–145)

## 2016-01-17 LAB — INFLUENZA A & B AG (RAPID TEST)
Influenza A Antigen: NEGATIVE
Influenza B Antigen: NEGATIVE

## 2016-01-17 LAB — TROPONIN I
Troponin-I, Qt.: <0.04 ng/mL (ref <0.05)
Troponin-I, Qt.: <0.04 ng/mL (ref <0.05)

## 2016-01-17 LAB — D DIMER: D-dimer: 1.23 mg/L FEU — ABNORMAL HIGH (ref 0.00–0.65)

## 2016-01-17 LAB — NT-PRO BNP: NT pro-BNP: 46 PG/ML (ref 0–125)

## 2016-01-17 MED ORDER — AZITHROMYCIN 250 MG TAB
250 mg | Freq: Every day | ORAL | Status: DC
Start: 2016-01-17 — End: 2016-01-18
  Administered 2016-01-18: 02:00:00 via ORAL

## 2016-01-17 MED ORDER — SODIUM CHLORIDE 0.9% BOLUS IV
0.9 % | Freq: Once | INTRAVENOUS | Status: AC
Start: 2016-01-17 — End: 2016-01-17
  Administered 2016-01-17: 18:00:00 via INTRAVENOUS

## 2016-01-17 MED ORDER — ENOXAPARIN 40 MG/0.4 ML SUB-Q SYRINGE
40 mg/0.4 mL | SUBCUTANEOUS | Status: DC
Start: 2016-01-17 — End: 2016-01-18
  Administered 2016-01-18: 02:00:00 via SUBCUTANEOUS

## 2016-01-17 MED ORDER — LEVALBUTEROL 1.25 MG/3 ML SOLN FOR INHALATION
1.25 mg/3 mL | Freq: Four times a day (QID) | RESPIRATORY_TRACT | Status: DC | PRN
Start: 2016-01-17 — End: 2016-01-18
  Administered 2016-01-18: 02:00:00 via RESPIRATORY_TRACT

## 2016-01-17 MED ORDER — CLONIDINE 0.1 MG TAB
0.1 mg | Freq: Two times a day (BID) | ORAL | Status: DC
Start: 2016-01-17 — End: 2016-01-18
  Administered 2016-01-18: 14:00:00 via ORAL

## 2016-01-17 MED ORDER — TRAZODONE 100 MG TAB
100 mg | Freq: Every evening | ORAL | Status: DC
Start: 2016-01-17 — End: 2016-01-18
  Administered 2016-01-18 (×2): via ORAL

## 2016-01-17 MED ORDER — ACETAMINOPHEN 325 MG TABLET
325 mg | ORAL | Status: DC | PRN
Start: 2016-01-17 — End: 2016-01-18
  Administered 2016-01-18 (×2): via ORAL

## 2016-01-17 MED ORDER — CLONIDINE 0.1 MG TAB
0.1 mg | ORAL | Status: AC
Start: 2016-01-17 — End: 2016-01-17
  Administered 2016-01-17: 23:00:00 via ORAL

## 2016-01-17 MED ORDER — IPRATROPIUM BROMIDE 17 MCG/ACTUATION AEROSOL INHALER
17 mcg/actuation | Freq: Four times a day (QID) | RESPIRATORY_TRACT | Status: DC | PRN
Start: 2016-01-17 — End: 2016-01-17

## 2016-01-17 MED ORDER — SODIUM CHLORIDE 0.9 % IJ SYRG
Freq: Three times a day (TID) | INTRAMUSCULAR | Status: DC
Start: 2016-01-17 — End: 2016-01-18
  Administered 2016-01-18: 06:00:00 via INTRAVENOUS

## 2016-01-17 MED ORDER — SODIUM CHLORIDE 0.9% BOLUS IV
0.9 % | Freq: Once | INTRAVENOUS | Status: AC
Start: 2016-01-17 — End: 2016-01-17
  Administered 2016-01-17: 22:00:00 via INTRAVENOUS

## 2016-01-17 MED ORDER — FLUTICASONE 50 MCG/ACTUATION NASAL SPRAY, SUSP
50 mcg/actuation | Freq: Every day | NASAL | Status: DC
Start: 2016-01-17 — End: 2016-01-18
  Administered 2016-01-18: 06:00:00 via NASAL

## 2016-01-17 MED ORDER — MORPHINE 4 MG/ML SYRINGE
4 mg/mL | Freq: Once | INTRAMUSCULAR | Status: AC
Start: 2016-01-17 — End: 2016-01-17
  Administered 2016-01-17: 18:00:00 via INTRAVENOUS

## 2016-01-17 MED ORDER — LOSARTAN 50 MG TAB
50 mg | Freq: Every day | ORAL | Status: DC
Start: 2016-01-17 — End: 2016-01-18
  Administered 2016-01-18: 14:00:00 via ORAL

## 2016-01-17 MED ORDER — IOPAMIDOL 76 % IV SOLN
370 mg iodine /mL (76 %) | Freq: Once | INTRAVENOUS | Status: AC
Start: 2016-01-17 — End: 2016-01-17
  Administered 2016-01-17: 20:00:00 via INTRAVENOUS

## 2016-01-17 MED ORDER — GUAIFENESIN SR 600 MG TAB
600 mg | Freq: Two times a day (BID) | ORAL | Status: DC
Start: 2016-01-17 — End: 2016-01-18
  Administered 2016-01-18 (×2): via ORAL

## 2016-01-17 MED ORDER — ALPRAZOLAM 0.5 MG TAB
0.5 mg | Freq: Two times a day (BID) | ORAL | Status: DC | PRN
Start: 2016-01-17 — End: 2016-01-18
  Administered 2016-01-18: 02:00:00 via ORAL

## 2016-01-17 MED ORDER — ATORVASTATIN 10 MG TAB
10 mg | Freq: Every day | ORAL | Status: DC
Start: 2016-01-17 — End: 2016-01-18
  Administered 2016-01-18: 14:00:00 via ORAL

## 2016-01-17 MED ORDER — BENZONATATE 100 MG CAP
100 mg | Freq: Three times a day (TID) | ORAL | Status: DC | PRN
Start: 2016-01-17 — End: 2016-01-18
  Administered 2016-01-18: 02:00:00 via ORAL

## 2016-01-17 MED ORDER — HYDROMORPHONE (PF) 1 MG/ML IJ SOLN
1 mg/mL | INTRAMUSCULAR | Status: AC
Start: 2016-01-17 — End: 2016-01-17
  Administered 2016-01-17: 20:00:00 via INTRAVENOUS

## 2016-01-17 MED ORDER — SODIUM CHLORIDE 0.9 % IJ SYRG
INTRAMUSCULAR | Status: DC | PRN
Start: 2016-01-17 — End: 2016-01-18

## 2016-01-17 MED ORDER — ACETAMINOPHEN 325 MG TABLET
325 mg | ORAL | Status: AC
Start: 2016-01-17 — End: 2016-01-17
  Administered 2016-01-17: 23:00:00 via ORAL

## 2016-01-17 MED ORDER — PANTOPRAZOLE 40 MG TAB, DELAYED RELEASE
40 mg | Freq: Two times a day (BID) | ORAL | Status: DC
Start: 2016-01-17 — End: 2016-01-18
  Administered 2016-01-17 – 2016-01-18 (×2): via ORAL

## 2016-01-17 MED FILL — MORPHINE 4 MG/ML SYRINGE: 4 mg/mL | INTRAMUSCULAR | Qty: 1

## 2016-01-17 MED FILL — ACETAMINOPHEN 325 MG TABLET: 325 mg | ORAL | Qty: 2

## 2016-01-17 MED FILL — PANTOPRAZOLE 40 MG TAB, DELAYED RELEASE: 40 mg | ORAL | Qty: 1

## 2016-01-17 MED FILL — SODIUM CHLORIDE 0.9 % IV: INTRAVENOUS | Qty: 1000

## 2016-01-17 MED FILL — ISOVUE-370  76 % INTRAVENOUS SOLUTION: 370 mg iodine /mL (76 %) | INTRAVENOUS | Qty: 100

## 2016-01-17 MED FILL — BD POSIFLUSH NORMAL SALINE 0.9 % INJECTION SYRINGE: INTRAMUSCULAR | Qty: 10

## 2016-01-17 MED FILL — SODIUM CHLORIDE 0.9 % IV: INTRAVENOUS | Qty: 500

## 2016-01-17 MED FILL — HYDROMORPHONE (PF) 1 MG/ML IJ SOLN: 1 mg/mL | INTRAMUSCULAR | Qty: 1

## 2016-01-17 MED FILL — CLONIDINE 0.1 MG TAB: 0.1 mg | ORAL | Qty: 2

## 2016-01-17 NOTE — Progress Notes (Signed)
HISTORY OF PRESENT ILLNESS  Mary Mayo is a 50 y.o. female.  HPI  Pt presents with "cold symptoms, sinus pain, and shoulder pain"    Sinus congestion  Coughing , coughing so much at night, and she is coughing up mucous.  Mucous is dark in color    In addition, patient is having extreme pain from her left shoulder, all the way down to her left fingertips  Hurts constantly, and is so intense that she feels like she can't handle it  She is short of breath  Aching on the left side of her chest  Pt is wondering if this pain is possibly in relation to her neck problems, for which Dr. Selena Batten has performed surgery on.  Pt does have an appointment with Dr. Ernestina Patches on 2/8, but this is the earliest appointment that he had available.  The pain is so extreme, and feels like a throbbing, tooth ache type pain.  The pain can wake her up at night.    Pt is very tachycardic, and has not taken any decongestants.  Review of Systems   Constitutional: Positive for malaise/fatigue. Negative for fever.   HENT: Positive for congestion.    Respiratory: Positive for cough and sputum production.    Cardiovascular: Positive for chest pain and claudication.   Gastrointestinal: Negative for diarrhea and vomiting.   Musculoskeletal: Positive for joint pain.       Physical Exam   Constitutional: She is oriented to person, place, and time. She appears well-developed and well-nourished.   HENT:   Head: Normocephalic and atraumatic.   Cardiovascular: Regular rhythm and normal heart sounds.  Tachycardia present.    Pulmonary/Chest: Effort normal. Tachypnea noted.   Neurological: She is alert and oriented to person, place, and time.   Skin: Skin is warm and dry.   Psychiatric: She has a normal mood and affect. Her behavior is normal.       ASSESSMENT and PLAN    ICD-10-CM ICD-9-CM    1. Arm pain, inferior, left M79.602 729.5 AMB POC EKG ROUTINE W/ 12 LEADS, INTER & REP   2. Shortness of breath R06.02 786.05     3. Acute non-recurrent maxillary sinusitis J01.00 461.0    4. Tachycardia R00.0 785.0      Due to multitude of symptoms, vital signs, and severity of pain patient is describing, would like patient to be assessed by ER.  Pt refuses EMS transfer at this time, and will have cousin drive her straight to [facility].    Pt informed to return to office with worsening of symptoms, or PRN with any questions or concerns.  Pt verbalizes understanding of plan of care and denies further questions or concerns at this time.

## 2016-01-17 NOTE — ED Notes (Signed)
Report received from Robin, RN.

## 2016-01-17 NOTE — ED Notes (Signed)
Vascular tech at bedside.

## 2016-01-17 NOTE — Consults (Signed)
Consult    Patient: Mary Mayo MRN: [id number]  SSN: [id number]    Date of Birth: 1966/05/24  Age: 50 y.o.  Sex: female       Subjective:      Date of  Admission: 01/17/2016     Admission type: Emergency    Mary Mayo is a 50 y.o. female admitted for chest and left arm pain. Patient complains of chest pressure/discomfort, dyspnea. This consultation was requested by ER . Patient's symptoms last approximately 2 days, and are gradual onset. She describes the symptoms as pleuritic, occurring onset during sleeping and at rest. She has running nose and cough with productive of clear nose. She has watery eyes and feels rough bith aches and pain. She has a fungal rash between breasts. The symptoms have been associated with deep breath and are worsened by changing position, cough. Previous treatment/evaluation includes EKG. Cardiac risk factors: smoking/ tobacco exposure, family history, dyslipidemia, diabetes mellitus, obesity, sedentary life style, hypertension.    Primary Care Provider: Dara Lords, MD  Past Medical History   Diagnosis Date   ??? Arthritis      spinal stenosis    ??? Asthma      last uses inhaler 2004   ??? Bipolar Affective, Depress, Unspec 07/11/2008   ??? Cyst      excision of a chronic abscess of left axilla   ??? Diabetes (HCC)      type 2   ??? Dysthymic disorder 08/06/2010   ??? GERD (gastroesophageal reflux disease)    ??? Hypercholesterolemia    ??? Hypertension    ??? Ill-defined condition      vitamin d deficiency   ??? Ill-defined condition      season allergies   ??? Ill-defined condition      high cholesterol   ??? Psychotic disorder      bipolar   ??? Unspecified sleep apnea      last used 5 years ago- discontinued due to weight loss      Past Surgical History   Procedure Laterality Date   ??? Hx wisdom teeth extraction     ??? Hx tonsillectomy     ??? Hx gyn  2004     hysterectomy   ??? Hx orthopaedic  12-2012     left foot heel spur     Family History   Problem Relation Age of Onset   ??? Diabetes Mother     ??? Cancer Mother    ??? Hypertension Mother    ??? Hypertension Father       Social History   Substance Use Topics   ??? Smoking status: Current Every Day Smoker     Packs/day: 0.25     Years: 30.00     Types: Cigarettes   ??? Smokeless tobacco: Never Used   ??? Alcohol use 1.0 oz/week     2 Standard drinks or equivalent per week      Comment: socially      Current Facility-Administered Medications   Medication Dose Route Frequency   ??? sodium chloride 0.9 % bolus infusion 1,000 mL  1,000 mL IntraVENous ONCE   ??? [START ON 01/18/2016] atorvastatin (LIPITOR) tablet 10 mg  10 mg Oral DAILY   ??? pantoprazole (PROTONIX) tablet 40 mg  40 mg Oral BID   ??? traZODone (DESYREL) tablet 100 mg  100 mg Oral QHS   ??? [START ON 01/18/2016] losartan (COZAAR) tablet 25 mg  25 mg Oral DAILY     Current  Outpatient Prescriptions   Medication Sig   ??? atorvastatin (LIPITOR) 10 mg tablet Take 1 Tab by mouth daily.   ??? ALPRAZolam (XANAX) 1 mg tablet Take 1 Tab by mouth two (2) times daily as needed for Anxiety. Max Daily Amount: 2 mg.   ??? losartan (COZAAR) 25 mg tablet Take 1 Tab by mouth daily.   ??? omeprazole (PRILOSEC) 40 mg capsule Take 1 Cap by mouth two (2) times a day.   ??? traZODone (DESYREL) 100 mg tablet Take 1 Tab by mouth nightly.   ??? metFORMIN ER (GLUCOPHAGE XR) 500 mg tablet Take 2 Tabs by mouth two (2) times daily (after meals).   ??? cloNIDine HCl (CATAPRES) 0.2 mg tablet Take 1 Tab by mouth two (2) times a day.   ??? hydrOXYzine (ATARAX) 50 mg tablet Take 1 Tab by mouth every six (6) hours as needed for Itching.   ??? EPINEPHrine (EPIPEN 2-PAK) 0.3 mg/0.3 mL injection 0.3 mL by IntraMUSCular route once as needed for up to 1 dose. If used, seek immediate medical attention.        Allergies   Allergen Reactions   ??? Niaspan [Niacin] Other (comments)     Facial flushing and felt hot   ??? Aspirin Hives and Nausea and Vomiting   ??? Ibuprofen Swelling     Hand and feet swelling        Review of Systems:   A comprehensive review of systems was negative except for that written in the History of Present Illness.       Subjective:     Visit Vitals   ??? BP (!) 156/99   ??? Pulse (!) 112   ??? Temp 98.9 ??F (37.2 ??C)   ??? Resp 25   ??? Ht  (1.575 m)   ??? Wt 98.9 kg (218 lb)   ??? SpO2 98%   ??? BMI 39.87 kg/m2        Physical Exam:  Visit Vitals   ??? BP (!) 156/99   ??? Pulse (!) 112   ??? Temp 98.9 ??F (37.2 ??C)   ??? Resp 25   ??? Ht  (1.575 m)   ??? Wt 98.9 kg (218 lb)   ??? SpO2 98%   ??? BMI 39.87 kg/m2     General Appearance:  Obese, well nourished,alert and oriented x 3, and individual in no acute distress.   Ears/Nose/Mouth/Throat:   Hearing grossly normal. Watery eyes and running nose. Unable to complete sentences due to persistent cough         Neck: Supple.   Chest:   Lungs clear to auscultation bilaterally. Occasional wheeze   Cardiovascular:  Regular rate and rhythm, S1, S2 normal, no murmur.   Abdomen:   Soft, non-tender, bowel sounds are active.   Extremities: No edema bilaterally.    Skin: Warm and dry.               Cardiographics:  Telemetry: normal sinus rhythm  ECG: normal EKG, normal sinus rhythm, unchanged from previous tracings  Echocardiogram: not done  Data Reviewed: All lab results for the last 24 hours reviewed.     Assessment:         Hospital Problems  Date Reviewed: 01/27/16          Codes Class Noted POA    * (Principal)Chest pain with high risk of acute coronary syndrome ICD-10-CM: R07.9  ICD-9-CM: 786.50  January 27, 2016 Unknown        Positive D dimer  ICD-10-CM: R79.1  ICD-9-CM: 790.92  01/17/2016 Unknown        Tobacco use disorder ICD-10-CM: F17.200  ICD-9-CM: 305.1  04/29/2012 Yes        Mixed hyperlipidemia ICD-10-CM: E78.2  ICD-9-CM: 272.2  01/10/2010 Yes        Unspecified sleep apnea ICD-10-CM: G47.30  ICD-9-CM: 780.57  06/14/2009 Yes        Type 2 diabetes mellitus without complication (HCC) ICD-10-CM: E11.9  ICD-9-CM: 250.00  02/04/2009 Yes        Esophageal reflux ICD-10-CM: K21.9   ICD-9-CM: 530.81  07/11/2008 Yes        Bipolar 1 disorder (HCC) ICD-10-CM: F31.9  ICD-9-CM: 296.7  07/11/2008 Yes               Plan:     Atypical chest pain with Abnormal EKG. Doubt acute. Likely due to HTN, Pt clearly has symptoms of coryza and has no hallmarks of ACS. Continue medical therapy and supportive care. Can add BNP to ER labs. D-Dimer is negative.   Hypercholesterolemia  unchanged    medical therapy and risk factor modification  Hypertension NOS  unchanged   Continue current therapy  Smoking cessation discussed.   Recommend Stress Test as an outpatient.     Signed By: Darnelle Spangle, MD       January 17, 2016

## 2016-01-17 NOTE — ED Notes (Signed)
Pt back from xray

## 2016-01-17 NOTE — Progress Notes (Signed)
Bilateral LE venous duplex completed. Final results to follow.

## 2016-01-17 NOTE — Progress Notes (Signed)
Identified pt with two pt identifiers(name and DOB).    Chief Complaint   Patient presents with   ??? Cold Symptoms     thick brown mucus x4 days   ??? Sinus Pain     face & cheeks   ??? Shoulder Pain     left   ??? Arm Pain     left      Pt states " i have deep pain in my left shoulder & arm, I woke up crying, i think it may be from my surgery for my back."    Health Maintenance Due   Topic   ??? EYE EXAM RETINAL OR DILATED Q1    ??? PAP AKA CERVICAL CYTOLOGY        Wt Readings from Last 3 Encounters:   01/17/16 218 lb (98.9 kg)   01/07/16 214 lb (97.1 kg)   10/04/15 208 lb (94.3 kg)     Temp Readings from Last 3 Encounters:   01/17/16 98.8 ??F (37.1 ??C) (Oral)   01/07/16 98.2 ??F (36.8 ??C) (Oral)   10/04/15 98.2 ??F (36.8 ??C) (Oral)     BP Readings from Last 3 Encounters:   01/17/16 134/84   01/07/16 122/78   10/04/15 134/84     Pulse Readings from Last 3 Encounters:   01/17/16 (!) 128   01/07/16 (!) 106   10/04/15 96         Learning Assessment:  :     Learning Assessment 04/26/2013 11/25/2012   PRIMARY LEARNER Patient Patient   HIGHEST LEVEL OF EDUCATION - PRIMARY LEARNER  SOME COLLEGE 2 YEARS OF COLLEGE   BARRIERS PRIMARY LEARNER NONE NONE   CO-LEARNER CAREGIVER No No   PRIMARY LANGUAGE ENGLISH ENGLISH   INTERPRETER NEED - No   LEARNER PREFERENCE PRIMARY LISTENING LISTENING   LEARNING SPECIAL TOPICS - no   ANSWERED BY self patient   RELATIONSHIP SELF SELF           Abuse Screening:  :     Abuse Screening Questionnaire 04/26/2013   Do you ever feel afraid of your partner? N   Are you in a relationship with someone who physically or mentally threatens you? N   Is it safe for you to go home? Y       Coordination of Care Questionnaire:  :     1) Have you been to an emergency room, urgent care clinic since your last visit? no   Hospitalized since your last visit? no             2) Have you seen or consulted any other health care providers outside of [facility] since your last visit? no  (Include any pap  smears or colon screenings in this section.)    3) Do you have an Advance Directive on file? no  Are you interested in receiving information about Advance Directives? no    Patient is accompanied by self I have received verbal consent from Bary Richard to discuss any/all medical information while they are present in the room.    Reviewed record in preparation for visit and have obtained necessary documentation.  Medication reconciliation up to date and corrected with patient at this time.

## 2016-01-17 NOTE — ED Triage Notes (Signed)
Pt states chest congestion with pain, cough, shortness of breath and left arm pain x 1 week

## 2016-01-17 NOTE — Progress Notes (Signed)
[facility]: MED RECONCILIATION    Comments/Recommendations:  Patient recalls all medications from memory and states that she only takes her medications at night time. She only had acetaminophen prior to coming to the hospital, which she does not normally take.    Medications added:     ?? None    Medications removed:    ?? None    Medications adjusted:    ?? None    Information obtained from:   Patient    Significant PMH/Disease States:   Patient Active Problem List   Diagnosis Code   ??? Esophageal reflux K21.9   ??? Lumbago M54.5   ??? Essential hypertension, benign I10   ??? Bipolar 1 disorder (HCC) F31.9   ??? Urge incontinence N39.41   ??? Type 2 diabetes mellitus without complication (HCC) E11.9   ??? Tachycardia, unspecified R00.0   ??? Unspecified sleep apnea G47.30   ??? Morbid obesity (HCC) E66.01   ??? Cyst    ??? Allergic rhinitis due to other allergen J30.89   ??? Migraine with aura, without mention of intractable migraine without mention of status migrainosus G43.109   ??? Mixed hyperlipidemia E78.2   ??? Dysthymic disorder F34.1   ??? Hidradenitis suppurativa L73.2   ??? Encounter for long-term (current) use of other medications Z79.899   ??? Microalbuminuria R80.9   ??? Lumbar spondylosis M47.816   ??? Unspecified vitamin D deficiency E55.9   ??? Tobacco use disorder F17.200   ??? Cervical stenosis of spine M48.02   ??? Spinal stenosis M48.00   ??? Frequent headaches R51   ??? Screening for thyroid disorder Z13.29   ??? Screening for colon cancer Z12.11   ??? Shortness of breath R06.02   ??? Acute non-recurrent maxillary sinusitis J01.00   ??? Tachycardia R00.0     Past Medical History   Diagnosis Date   ??? Arthritis      spinal stenosis    ??? Asthma      last uses inhaler 2004   ??? Bipolar Affective, Depress, Unspec 07/11/2008   ??? Cyst      excision of a chronic abscess of left axilla   ??? Diabetes (HCC)      type 2   ??? Dysthymic disorder 08/06/2010   ??? GERD (gastroesophageal reflux disease)    ??? Hypercholesterolemia    ??? Hypertension    ??? Ill-defined condition       vitamin d deficiency   ??? Ill-defined condition      season allergies   ??? Ill-defined condition      high cholesterol   ??? Psychotic disorder      bipolar   ??? Unspecified sleep apnea      last used 5 years ago- discontinued due to weight loss     Chief Complaint for this Admission:   Chief Complaint   Patient presents with   ??? Chest Pain   ??? Cough   ??? Nasal Congestion     Allergies: Niaspan [niacin]; Aspirin; and Ibuprofen    Prior to Admission Medications:   Prior to Admission Medications   Prescriptions Last Dose Informant Patient Reported? Taking?   ALPRAZolam (XANAX) 1 mg tablet 01/16/2016 at PM Self No Yes   Sig: Take 1 Tab by mouth two (2) times daily as needed for Anxiety. Max Daily Amount: 2 mg.   EPINEPHrine (EPIPEN 2-PAK) 0.3 mg/0.3 mL injection Unknown at Unknown time Self No No   Sig: 0.3 mL by IntraMUSCular route once as needed for up to 1  dose. If used, seek immediate medical attention.   atorvastatin (LIPITOR) 10 mg tablet 01/16/2016 at PM Self No Yes   Sig: Take 1 Tab by mouth daily.   cloNIDine HCl (CATAPRES) 0.2 mg tablet 01/16/2016 at PM Self No Yes   Sig: Take 1 Tab by mouth two (2) times a day.   hydrOXYzine (ATARAX) 50 mg tablet Unknown at Unknown time Self No No   Sig: Take 1 Tab by mouth every six (6) hours as needed for Itching.   losartan (COZAAR) 25 mg tablet 01/16/2016 at PM Self No Yes   Sig: Take 1 Tab by mouth daily.   metFORMIN ER (GLUCOPHAGE XR) 500 mg tablet 01/16/2016 at PM Self No Yes   Sig: Take 2 Tabs by mouth two (2) times daily (after meals).   omeprazole (PRILOSEC) 40 mg capsule 01/16/2016 at PM Self No Yes   Sig: Take 1 Cap by mouth two (2) times a day.   traZODone (DESYREL) 100 mg tablet 01/16/2016 at PM Self No Yes   Sig: Take 1 Tab by mouth nightly.      Facility-Administered Medications: None     Feliciana Rossetti, PharmD, BCPS  Contact: [phone]

## 2016-01-17 NOTE — ED Provider Notes (Signed)
HPI Comments: 50 y.o. female with past medical history significant for HTN, asthma, spinal stenosis, bipolar affective disorder, DM, and hypercholesterolemia who presents from home with chief complaint of arm pain. Pt states that starting in September of 2016, she began to experience intermittent left arm pain, which increased in January where she was experiencing the arm pain every day, and over the past week becoming more intense at a 10/10 on the pain scale with associated throbbing/tingling. Pt states that 2 days ago she began experiencing constant CP as well. Pt states that her pain is worsened by taking a deep breath. Pt reports that she has also been experiencing a productive cough with brown phlegm for the past week with associated SOB, congestion, diaphoresis and chills. Pt notes that she has previously had had surgery for spinal stenosis and claims that her sxs feel similar to what she experienced prior to needing surgery for her stenosis, except for the CP, which is new this episode. Pt denies nausea, fever, abdominal pain, or leg swelling. Pt denies hx of blood clots, recent hospitalizations or use of birth control pills. There are no other acute medical concerns at this time.    Family hx: stroke and heart disease.  PCP: Dara Lords, MD    Note written by [place] P. Hiegel, Scribe, as dictated by Tollie Pizza. Gonsalo Cuthbertson, MD 11:50 PM      The history is provided by the patient. No language interpreter was used.        Past Medical History:   Diagnosis Date   ??? Arthritis      spinal stenosis    ??? Asthma      last uses inhaler 2004   ??? Bipolar Affective, Depress, Unspec 07/11/2008   ??? Cyst      excision of a chronic abscess of left axilla   ??? Diabetes (HCC)      type 2   ??? Dysthymic disorder 08/06/2010   ??? GERD (gastroesophageal reflux disease)    ??? Hypercholesterolemia    ??? Hypertension    ??? Ill-defined condition      vitamin d deficiency   ??? Ill-defined condition      season allergies    ??? Ill-defined condition      high cholesterol   ??? Psychotic disorder      bipolar   ??? Unspecified sleep apnea      last used 5 years ago- discontinued due to weight loss       Past Surgical History:   Procedure Laterality Date   ??? Hx wisdom teeth extraction     ??? Hx tonsillectomy     ??? Hx gyn  2004     hysterectomy   ??? Hx orthopaedic  12-2012     left foot heel spur         Family History:   Problem Relation Age of Onset   ??? Diabetes Mother    ??? Cancer Mother    ??? Hypertension Mother    ??? Hypertension Father        Social History     Social History   ??? Marital status: DIVORCED     Spouse name: N/A   ??? Number of children: 3   ??? Years of education: 29     Occupational History   ??? Unemployed      Social History Main Topics   ??? Smoking status: Current Every Day Smoker     Packs/day: 0.25     Years: 30.00  Types: Cigarettes   ??? Smokeless tobacco: Never Used   ??? Alcohol use 1.0 oz/week     2 Standard drinks or equivalent per week      Comment: socially   ??? Drug use: Yes     Special: Marijuana      Comment: last used 2 weeks ago   ??? Sexual activity: Not Currently     Other Topics Concern   ??? Not on file     Social History Narrative         ALLERGIES: Niaspan [niacin]; Aspirin; and Ibuprofen    Review of Systems   Constitutional: Positive for chills and diaphoresis. Negative for fever.   HENT: Positive for congestion. Negative for ear pain and sore throat.    Eyes: Negative for pain.   Respiratory: Positive for cough and shortness of breath. Negative for chest tightness.    Cardiovascular: Positive for chest pain. Negative for leg swelling.   Gastrointestinal: Negative for abdominal pain, nausea and vomiting.   Genitourinary: Negative for dysuria and flank pain.   Musculoskeletal: Negative for back pain.        Pain of the left arm   Skin: Negative for rash.   Neurological: Negative for headaches.        Tingling in the left arm   All other systems reviewed and are negative.      Vitals:    01/17/16 1132    BP: (!) 157/112   Pulse: (!) 122   Resp: 16   Temp: 98.9 ??F (37.2 ??C)   SpO2: 99%   Weight: 98.9 kg (218 lb)   Height:  (1.575 m)            Physical Exam   Constitutional: She appears well-developed and well-nourished. No distress.   HENT:   Head: Normocephalic and atraumatic.   Eyes: EOM are normal. Pupils are equal, round, and reactive to light.   Neck: Neck supple. No tracheal deviation present.   Cardiovascular: Regular rhythm and normal heart sounds.  Tachycardia present.    Pulmonary/Chest: Effort normal and breath sounds normal. No respiratory distress.   Abdominal: Soft. She exhibits no distension. There is no tenderness. There is no rebound and no guarding.   Genitourinary:   Genitourinary Comments: deferred   Musculoskeletal: She exhibits no edema.   No LE edema.   Neurological: She is alert.   Skin: Skin is warm and dry.   Psychiatric: She has a normal mood and affect.   Nursing note and vitals reviewed.   Note written by [place] P. Hiegel, Scribe, as dictated by Tollie Pizza. Destinae Neubecker, MD 11:50 AM      MDM  Number of Diagnoses or Management Options  Chest pain, unspecified type:   Diagnosis management comments: Diff dx: ACS/ MI, pneumonia, chest wall pain, viral syndrome, PE.  Plan: EKG, CXR, trop, ddimer, basic labs    EKG shows ST at 123, nl intervals and axis, non-specific t wave abnormalities in lateral leads new compared to prior    Troponin negative    ddimer positive.  CTA negative    Patient had continued CP in emergency department despite IV narcotics.    Cardiology consulted    Impression: Chest Pain, cough  Plan: Admit    ED Course       Procedures      3:09 PM  Patient is being admitted to the hospital.  The results of their tests and reasons for their admission have been discussed with them  and/or available family.  They convey agreement and understanding for the need to be admitted and for their admission diagnosis.  Consultation will be made now  with the inpatient physician for hospitalization.    CONSULT NOTE:  3:11 PM Kshawn Canal J. Antoine Primas, MD spoke with Dr. Darnelle Spangle, MD, Consult for Cardiology.  Discussed available diagnostic tests and clinical findings.  He is in agreement with care plans as outlined.  Dr. Darnelle Spangle, MD will see the pt.    CONSULT NOTE:  3:37 PM Jadine Brumley J. Antoine Primas, MD spoke with [facility] Resident, Consult for FP.  Discussed available diagnostic tests and clinical findings.  He is in agreement with care plans as outlined. Family Practice resident will see the pt.

## 2016-01-17 NOTE — ED Notes (Signed)
Patient has been advised she will be admitted to a room when one is available.

## 2016-01-17 NOTE — H&P (Signed)
[facility]   Admission H&P    Date of admission: 01/17/2016    Patient name: Mary Mayo  MRN: [id number]  Date of birth: [birth year]  Age: 50 y.o.     Primary care provider:  Dara Lords, MD     Source of Information: patient, medical records    Chief complaint:  Chest Pain    History of Present Illness  Mary Mayo is a 50 y.o. female with relevant Hx of HTN, DM-2, Asthma, Hyperlipidemia, GERD and Anxiety who presents to the ER complaining of Chest Pain. The pain is mainly located in the Lt side of the chest and Lt arm for several weeks, that became worse this morning. She states that the pain is worse with movement of the Lt arm of in deep inspiration. She also reports a persistent cough since about a week that is related to a brownish sputum without blood and clear/greenish nasal secretions. She lives with her uncle who has been coughing for the last 2 weeks. These symptoms are also related to SOB, chills, sore throat, watery diarrhea w/o blood. Denies fever, diaphoresis, abdominal pain, n/v, leg pain. The patient have a Hx of spinal stenosis with a previous surgery.    In the ER, vital signs were remarkable for HR 112, BP 156/99. Labs were remarkable for Glu 180, D-Dimer 1.23, Trop <0.04.  CXR and CTA Chest showed no acute process.  Pt was treated with Morphine and Dilaudid.     Home Medications   Prior to Admission medications    Medication Sig Start Date End Date Taking? Authorizing Provider   atorvastatin (LIPITOR) 10 mg tablet Take 1 Tab by mouth daily. 01/08/16  Yes Madeline R Klim, NP   ALPRAZolam (XANAX) 1 mg tablet Take 1 Tab by mouth two (2) times daily as needed for Anxiety. Max Daily Amount: 2 mg. 01/07/16  Yes Madeline R Klim, NP   losartan (COZAAR) 25 mg tablet Take 1 Tab by mouth daily. 10/04/15  Yes Dara Lords, MD   omeprazole (PRILOSEC) 40 mg capsule Take 1 Cap by mouth two (2) times a day. 10/04/15  Yes Dara Lords, MD    traZODone (DESYREL) 100 mg tablet Take 1 Tab by mouth nightly. 05/03/15  Yes Vianne Bulls, MD   metFORMIN ER (GLUCOPHAGE XR) 500 mg tablet Take 2 Tabs by mouth two (2) times daily (after meals). 05/03/15  Yes Vianne Bulls, MD   cloNIDine HCl (CATAPRES) 0.2 mg tablet Take 1 Tab by mouth two (2) times a day. 05/03/15  Yes Vianne Bulls, MD   hydrOXYzine (ATARAX) 50 mg tablet Take 1 Tab by mouth every six (6) hours as needed for Itching. 10/04/15   Dara Lords, MD   EPINEPHrine (EPIPEN 2-PAK) 0.3 mg/0.3 mL injection 0.3 mL by IntraMUSCular route once as needed for up to 1 dose. If used, seek immediate medical attention. 10/04/15   Dara Lords, MD       Allergies   Allergies   Allergen Reactions   ??? Niaspan [Niacin] Other (comments)     Facial flushing and felt hot   ??? Aspirin Hives and Nausea and Vomiting   ??? Ibuprofen Swelling     Hand and feet swelling       Past Medical History   Diagnosis Date   ??? Arthritis      spinal stenosis    ??? Asthma      last uses inhaler  2004   ??? Bipolar Affective, Depress, Unspec 07/11/2008   ??? Cyst      excision of a chronic abscess of left axilla   ??? Diabetes (HCC)      type 2   ??? Dysthymic disorder 08/06/2010   ??? GERD (gastroesophageal reflux disease)    ??? Hypercholesterolemia    ??? Hypertension    ??? Ill-defined condition      vitamin d deficiency   ??? Ill-defined condition      season allergies   ??? Ill-defined condition      high cholesterol   ??? Psychotic disorder      bipolar   ??? Unspecified sleep apnea      last used 5 years ago- discontinued due to weight loss       Past Surgical History   Procedure Laterality Date   ??? Hx wisdom teeth extraction     ??? Hx tonsillectomy     ??? Hx gyn  2004     hysterectomy   ??? Hx orthopaedic  12-2012     left foot heel spur       Family History   Problem Relation Age of Onset   ??? Diabetes Mother    ??? Cancer Mother    ??? Hypertension Mother    ??? Hypertension Father        Social History   Patient resides  x  Independently      With family care       Assisted living      SNF      Ambulates  x  Independently      With cane       Assisted walker           Alcohol history     None   x  Social     Chronic     Smoking history    None     Former smoker   x  Current smoker (1/2 pack per week for 30 years)     History   Smoking Status   ??? Current Every Day Smoker   ??? Packs/day: 0.25   ??? Years: 30.00   ??? Types: Cigarettes   Smokeless Tobacco   ??? Never Used       Drug history    None     Former drug user   x  Current drug user (Marijuana, last 2 weeks ago)     Sexual history    Sexually active    x  Not sexually active     Code status  x  Full code     DNR/DNI     Partial    Code status discussed with the patient/caregivers.      Review of Systems (Positives in bold)   Constitutional: chills, fever and weight loss.   HENT: sore throat.   Eyes: blurred vision and double vision.   Respiratory: cough, hemoptysis and wheezing, rhinorrhea   Cardiovascular: Lt chest pain, SOB, orthopnea and leg swelling.   Gastrointestinal: abdominal pain, diarrhea, heartburn, nausea and vomiting.   Genitourinary: Negative for dysuria, frequency and urgency.   Musculoskeletal: Positive for joint pain. Negative for myalgias.   Skin: Negative for itching and rash.   Neurological: Negative for dizziness, seizures and headaches.   Endo/Heme/Allergies: Positive for environmental allergies. Negative for polydipsia. Does not bruise/bleed easily.   Psychiatric/Behavioral: Negative for depression and suicidal ideas.     Physical Exam  Visit Vitals   ??? BP 150/87   ???  Pulse (!) 113   ??? Temp 98.9 ??F (37.2 ??C)   ??? Resp 16   ??? Ht  (1.575 m)   ??? Wt 218 lb (98.9 kg)   ??? SpO2 97%   ??? BMI 39.87 kg/m2        General: No acute distress. Alert. Cooperative. Coughing.   Head: Normocephalic. Atraumatic.   Eyes:  Conjunctiva pink. Sclera white. PERRL.   Ears:  Ear canals patent. TM non-erythematous.   Nose:  Septum midline. Mucosa pink. No drainage.    Throat: Mucosa pink. Moist mucous membranes. No tonsillar exudates or erythema. Palate movement equal bilaterally.   Neck: Supple. Normal ROM. No stiffness.   Respiratory: CTA. No w/r/r/c.    Cardiovascular: Tachycardia. Normal S1,S2. No m/r/g. Pulses 2+ throughout.   GI: + bowel sounds. Nontender. No rebound tenderness or guarding. Nondistended.   Extremities: No edema. No palpable cord. No tenderness.   Musculoskeletal: Full ROM in all extremities.   Neuro: CN II-XII grossly intact. Strength 5/5 in all extremities. Sensation intact in all extremities. DTRs 2+ throughout.   Skin: Clear. No rashes. No ulcers.    GU: Deferred   Rectal: Deferred       Laboratory Data  Recent Results (from the past 24 hour(s))   CBC WITH AUTOMATED DIFF    Collection Time: 01/17/16 12:35 PM   Result Value Ref Range    WBC 6.5 3.6 - 11.0 K/uL    RBC 4.34 3.80 - 5.20 M/uL    HGB 12.3 11.5 - 16.0 g/dL    HCT 56.2 13.0 - 86.5 %    MCV 90.6 80.0 - 99.0 FL    MCH 28.3 26.0 - 34.0 PG    MCHC 31.3 30.0 - 36.5 g/dL    RDW 78.4 69.6 - 29.5 %    PLATELET 206 150 - 400 K/uL    NEUTROPHILS 50 32 - 75 %    LYMPHOCYTES 41 12 - 49 %    MONOCYTES 7 5 - 13 %    EOSINOPHILS 2 0 - 7 %    BASOPHILS 0 0 - 1 %    ABS. NEUTROPHILS 3.3 1.8 - 8.0 K/UL    ABS. LYMPHOCYTES 2.7 0.8 - 3.5 K/UL    ABS. MONOCYTES 0.4 0.0 - 1.0 K/UL    ABS. EOSINOPHILS 0.1 0.0 - 0.4 K/UL    ABS. BASOPHILS 0.0 0.0 - 0.1 K/UL   D DIMER    Collection Time: 01/17/16 12:35 PM   Result Value Ref Range    D-dimer 1.23 (H) 0.00 - 0.65 mg/L FEU   METABOLIC PANEL, BASIC    Collection Time: 01/17/16 12:35 PM   Result Value Ref Range    Sodium 139 136 - 145 mmol/L    Potassium 3.9 3.5 - 5.1 mmol/L    Chloride 102 97 - 108 mmol/L    CO2 27 21 - 32 mmol/L    Anion gap 10 5 - 15 mmol/L    Glucose 180 (H) 65 - 100 mg/dL    BUN 11 6 - 20 MG/DL    Creatinine 2.84 1.32 - 1.02 MG/DL    BUN/Creatinine ratio 11 (L) 12 - 20      GFR est AA >60 >60 ml/min/1.63m2    GFR est non-AA 58 (L) >60 ml/min/1.1m2     Calcium 8.9 8.5 - 10.1 MG/DL   TROPONIN I    Collection Time: 01/17/16 12:35 PM   Result Value Ref Range    Troponin-I, Qt. <  0.04 <0.05 ng/mL       Imaging    Exam: 2 view chest  ??  Indication: Chest pain, shortness of breath and congestion x1 week.  ??  COMPARISON: 01/15/2009  ??  PA and lateral views demonstrate normal heart size. The patient is on a cardiac  monitor. Lungs well aerated and clear. There is a minimal linear scar in the  left mid to lower lung zone laterally.  ??  Patient status post cervical spine surgery.  ??  IMPRESSION  IMPRESSION:  1. No acute process    EXAM: CTA CHEST W WO CONT  ??  INDICATION: Chest congestion with cough and shortness of breath for one week.  ??  COMPARISON: Chest radiographs 01/17/2016  ??  TECHNIQUE: Helical thin section chest CT following uneventful intravenous  administration of nonionic contrast 90 mL of Isovue-370 according to  departmental PE protocol. Coronal and sagittal reformats were performed. 3D/MIP  post processing was performed. CT dose reduction was achieved through use of a  standardized protocol tailored for this examination and automatic exposure  control for dose modulation.  ??  FINDINGS: This is a good quality study for the evaluation of pulmonary embolism  to the first subsegmental arterial level. There is no pulmonary embolism to this  level.   ??  The aorta and main pulmonary artery are normal in caliber. Cardiac size is  within normal limits. No pericardial effusion. There are prominent but  nonenlarged mediastinal lymph nodes. There are no enlarged axillary or hilar  lymph nodes.  ??  There is mild bilateral dependent atelectasis. There is no lung nodule, mass, or  consolidation. No pleural effusion or pneumothorax. Central airways are  unremarkable.  ??  Limited images of the upper abdomen are within normal limits. The bony  structures are age-appropriate  ??  IMPRESSION  IMPRESSION:   There is no acute pulmonary embolism or other acute abnormality in the  chest.    EKG:  there are no previous tracings available for comparison, sinus tachycardia, unspecific T wave changes.    Assessment and Plan   GAYLEN PEREIRA is a 50 y.o. female who is admitted for Chest Pain.    Chest Pain/ACS work up. First troponin negative x 1. EKG with unspecific T wave abnormality. Risk factors include obesity, history of HTN, hyperlipidemia. Cardiology evaluated the patient and concluded that the patient is stable from a cardiology stand point. Recommends to follow up OP with stress test.  - Admit to telemetry  - F/u serial troponins     - Oxygen, Nitrostat, morphine PRN    - Diabetic diet    URI; Clear CXR and CT Chest.   - Mucinex 600 mg BID  - Tessalon 100 mg PO TID  - Flonase qDay    Asthma - Stable. Need to monitor for signs and symptoms in case of exacerbation.  - Xopenex inhaler Q6H PRN  - Ipratropium inhaler     Hypertension; Current BP: 150/87  -Continue home Catapres 0.2 mg PO BID and Cozaar 25 mg PO qDay    DM; HgA1C  6.9 (01/07/16).  Home Metformin 1,000 mg BID.   -POC Glucose checks  -ISS  - Goal glucose concentration >70, <140 pre-meal and <180 with all random glucoses.    Hyperlipidemia; Lipid Panel (01/07/16) Tchol: 221 LDL: 154 HDL: 31 Trigs: 181  -Continue Lipitor 10 mg every day    GERD; Stable. At home Prilosec 40 mg BID  - Hold Prilosec at hospital   -  Start Protonix 40 mg qDay    Anxiety; Stable.   - Continue home Xanax 1 mg at night  - Continue home Trazodone 100 mg at night    FEN/GI - Diabetic diet  DVT prophylaxis - Lovenox SubQ  GI prophylaxis -  Protonix  Disposition - Plan to d/c to Home.    CODE STATUS:  FULL CODE       Patient discussed with Dr. Lawernce Ion, MD  [facility] Medicine Resident      [facility] Problems  Date Reviewed: Jan 26, 2016          Codes Class Noted POA    * (Principal)Chest pain with high risk of acute coronary syndrome ICD-10-CM: R07.9  ICD-9-CM: 786.50  01-26-16 Unknown         Positive D dimer ICD-10-CM: R79.1  ICD-9-CM: 790.92  Jan 26, 2016 Unknown        Tobacco use disorder ICD-10-CM: F17.200  ICD-9-CM: 305.1  04/29/2012 Yes        Mixed hyperlipidemia ICD-10-CM: E78.2  ICD-9-CM: 272.2  01/10/2010 Yes        Unspecified sleep apnea ICD-10-CM: G47.30  ICD-9-CM: 780.57  06/14/2009 Yes        Type 2 diabetes mellitus without complication (HCC) ICD-10-CM: E11.9  ICD-9-CM: 250.00  02/04/2009 Yes        Esophageal reflux ICD-10-CM: K21.9  ICD-9-CM: 530.81  07/11/2008 Yes        Bipolar 1 disorder (HCC) ICD-10-CM: F31.9  ICD-9-CM: 296.7  07/11/2008 Yes

## 2016-01-17 NOTE — ED Notes (Signed)
Pt C/O headache that she was supposed to see neurologist today but "medical transportation" did not show up. MD informed. Pt also given hospital socks and pt ambulated to the bathroom without assistance.

## 2016-01-18 ENCOUNTER — Observation Stay: Admit: 2016-01-18 | Payer: MEDICARE | Primary: Family Medicine

## 2016-01-18 LAB — EKG, 12 LEAD, INITIAL
Atrial Rate: 123 {beats}/min
Calculated P Axis: 76 degrees
Calculated R Axis: 15 degrees
Calculated T Axis: 64 degrees
P-R Interval: 136 ms
Q-T Interval: 314 ms
QRS Duration: 80 ms
QTC Calculation (Bezet): 449 ms
Ventricular Rate: 123 {beats}/min

## 2016-01-18 LAB — GLUCOSE, POC
Glucose (POC): 204 mg/dL — ABNORMAL HIGH (ref 65–100)
Glucose (POC): 162 mg/dL — ABNORMAL HIGH (ref 65–100)

## 2016-01-18 LAB — CBC WITH AUTOMATED DIFF
ABS. BASOPHILS: 0 10*3/uL (ref 0.0–0.1)
ABS. EOSINOPHILS: 0 10*3/uL (ref 0.0–0.4)
ABS. LYMPHOCYTES: 3.2 10*3/uL (ref 0.8–3.5)
ABS. MONOCYTES: 0.6 10*3/uL (ref 0.0–1.0)
ABS. NEUTROPHILS: 4.1 10*3/uL (ref 1.8–8.0)
BASOPHILS: 0 % (ref 0–1)
EOSINOPHILS: 1 % (ref 0–7)
HCT: 34.9 % — ABNORMAL LOW (ref 35.0–47.0)
HGB: 11.2 g/dL — ABNORMAL LOW (ref 11.5–16.0)
LYMPHOCYTES: 40 % (ref 12–49)
MCH: 28.6 PG (ref 26.0–34.0)
MCHC: 32.1 g/dL (ref 30.0–36.5)
MCV: 89 FL (ref 80.0–99.0)
MONOCYTES: 7 % (ref 5–13)
NEUTROPHILS: 52 % (ref 32–75)
PLATELET: 214 10*3/uL (ref 150–400)
RBC: 3.92 M/uL (ref 3.80–5.20)
RDW: 13.6 % (ref 11.5–14.5)
WBC: 7.9 10*3/uL (ref 3.6–11.0)

## 2016-01-18 LAB — METABOLIC PANEL, COMPREHENSIVE
A-G Ratio: 0.8 — ABNORMAL LOW (ref 1.1–2.2)
ALT (SGPT): 53 U/L (ref 12–78)
AST (SGOT): 31 U/L (ref 15–37)
Albumin: 3.3 g/dL — ABNORMAL LOW (ref 3.5–5.0)
Alk. phosphatase: 78 U/L (ref 45–117)
Anion gap: 11 mmol/L (ref 5–15)
BUN/Creatinine ratio: 12 (ref 12–20)
BUN: 11 MG/DL (ref 6–20)
Bilirubin, total: 0.2 MG/DL (ref 0.2–1.0)
CO2: 24 mmol/L (ref 21–32)
Calcium: 8 MG/DL — ABNORMAL LOW (ref 8.5–10.1)
Chloride: 101 mmol/L (ref 97–108)
Creatinine: 0.95 MG/DL (ref 0.55–1.02)
GFR est AA: >60 mL/min/{1.73_m2} (ref >60)
GFR est non-AA: >60 mL/min/{1.73_m2} (ref >60)
Globulin: 4.1 g/dL — ABNORMAL HIGH (ref 2.0–4.0)
Glucose: 176 mg/dL — ABNORMAL HIGH (ref 65–100)
Potassium: 4 mmol/L (ref 3.5–5.1)
Protein, total: 7.4 g/dL (ref 6.4–8.2)
Sodium: 136 mmol/L (ref 136–145)

## 2016-01-18 LAB — TROPONIN I: Troponin-I, Qt.: <0.04 ng/mL (ref <0.05)

## 2016-01-18 MED ORDER — CYCLOBENZAPRINE 10 MG TAB
10 mg | Freq: Once | ORAL | Status: AC
Start: 2016-01-18 — End: 2016-01-18
  Administered 2016-01-18: 15:00:00 via ORAL

## 2016-01-18 MED ORDER — MORPHINE 2 MG/ML INJECTION
2 mg/mL | Freq: Once | INTRAMUSCULAR | Status: AC
Start: 2016-01-18 — End: 2016-01-18
  Administered 2016-01-18: 06:00:00 via INTRAVENOUS

## 2016-01-18 MED ORDER — CYCLOBENZAPRINE 10 MG TAB
10 mg | ORAL_TABLET | Freq: Three times a day (TID) | ORAL | 0 refills | Status: DC | PRN
Start: 2016-01-18 — End: 2016-01-18

## 2016-01-18 MED ORDER — GLUCAGON 1 MG INJECTION
1 mg | INTRAMUSCULAR | Status: DC | PRN
Start: 2016-01-18 — End: 2016-01-18

## 2016-01-18 MED ORDER — CYCLOBENZAPRINE 10 MG TAB
10 mg | ORAL_TABLET | Freq: Three times a day (TID) | ORAL | 0 refills | Status: DC | PRN
Start: 2016-01-18 — End: 2016-05-27

## 2016-01-18 MED ORDER — AZITHROMYCIN 250 MG TAB
250 mg | ORAL_TABLET | Freq: Every day | ORAL | 0 refills | Status: DC
Start: 2016-01-18 — End: 2016-01-18

## 2016-01-18 MED ORDER — GUAIFENESIN SR 600 MG TAB
600 mg | ORAL_TABLET | Freq: Two times a day (BID) | ORAL | 0 refills | Status: DC
Start: 2016-01-18 — End: 2016-01-20

## 2016-01-18 MED ORDER — FLUTICASONE 50 MCG/ACTUATION NASAL SPRAY, SUSP
50 mcg/actuation | Freq: Every day | NASAL | 0 refills | Status: AC
Start: 2016-01-18 — End: ?

## 2016-01-18 MED ORDER — FLUTICASONE 50 MCG/ACTUATION NASAL SPRAY, SUSP
50 mcg/actuation | Freq: Every day | NASAL | 0 refills | Status: DC
Start: 2016-01-18 — End: 2016-01-18

## 2016-01-18 MED ORDER — DICLOFENAC 1 % TOPICAL GEL
1 % | Freq: Four times a day (QID) | CUTANEOUS | 0 refills | Status: DC
Start: 2016-01-18 — End: 2016-01-18

## 2016-01-18 MED ORDER — GUAIFENESIN SR 600 MG TAB
600 mg | ORAL_TABLET | Freq: Two times a day (BID) | ORAL | 0 refills | Status: DC
Start: 2016-01-18 — End: 2016-01-18

## 2016-01-18 MED ORDER — CLONIDINE 0.2 MG TAB
0.2 mg | ORAL_TABLET | Freq: Two times a day (BID) | ORAL | 0 refills | Status: DC
Start: 2016-01-18 — End: 2016-05-19

## 2016-01-18 MED ORDER — BENZONATATE 100 MG CAP
100 mg | ORAL_CAPSULE | Freq: Three times a day (TID) | ORAL | 0 refills | Status: DC | PRN
Start: 2016-01-18 — End: 2016-03-25

## 2016-01-18 MED ORDER — GLUCOSE 4 GRAM CHEWABLE TAB
4 gram | ORAL | Status: DC | PRN
Start: 2016-01-18 — End: 2016-01-18

## 2016-01-18 MED ORDER — LIDOCAINE 2 % MUCOSAL SOLN
2 % | Freq: Once | Status: AC
Start: 2016-01-18 — End: 2016-01-18
  Administered 2016-01-18: 10:00:00 via ORAL

## 2016-01-18 MED ORDER — INSULIN LISPRO 100 UNIT/ML INJECTION
100 unit/mL | Freq: Four times a day (QID) | SUBCUTANEOUS | Status: DC
Start: 2016-01-18 — End: 2016-01-18
  Administered 2016-01-18 (×2): via SUBCUTANEOUS

## 2016-01-18 MED ORDER — MORPHINE 2 MG/ML INJECTION
2 mg/mL | Freq: Once | INTRAMUSCULAR | Status: DC
Start: 2016-01-18 — End: 2016-01-17
  Administered 2016-01-18: 01:00:00 via INTRAVENOUS

## 2016-01-18 MED ORDER — DICLOFENAC 1 % TOPICAL GEL
1 % | Freq: Four times a day (QID) | CUTANEOUS | 0 refills | Status: AC
Start: 2016-01-18 — End: ?

## 2016-01-18 MED ORDER — IPRATROPIUM BROMIDE 0.02 % SOLN FOR INHALATION
0.02 % | Freq: Four times a day (QID) | RESPIRATORY_TRACT | Status: DC | PRN
Start: 2016-01-18 — End: 2016-01-18
  Administered 2016-01-18: 02:00:00 via RESPIRATORY_TRACT

## 2016-01-18 MED ORDER — DEXTROSE 50% IN WATER (D50W) IV SYRG
INTRAVENOUS | Status: DC | PRN
Start: 2016-01-18 — End: 2016-01-18

## 2016-01-18 MED ORDER — BENZONATATE 100 MG CAP
100 mg | ORAL_CAPSULE | Freq: Three times a day (TID) | ORAL | 0 refills | Status: DC | PRN
Start: 2016-01-18 — End: 2016-01-18

## 2016-01-18 MED ORDER — CLONIDINE 0.2 MG TAB
0.2 mg | ORAL_TABLET | Freq: Two times a day (BID) | ORAL | 0 refills | Status: DC
Start: 2016-01-18 — End: 2016-01-18

## 2016-01-18 MED ORDER — AZITHROMYCIN 250 MG TAB
250 mg | ORAL_TABLET | Freq: Every day | ORAL | 0 refills | Status: AC
Start: 2016-01-18 — End: 2016-01-22

## 2016-01-18 MED FILL — IPRATROPIUM BROMIDE 0.02 % SOLN FOR INHALATION: 0.02 % | RESPIRATORY_TRACT | Qty: 2.5

## 2016-01-18 MED FILL — LOSARTAN 25 MG TAB: 25 mg | ORAL | Qty: 1

## 2016-01-18 MED FILL — LIPITOR 10 MG TABLET: 10 mg | ORAL | Qty: 1

## 2016-01-18 MED FILL — TRAZODONE 100 MG TAB: 100 mg | ORAL | Qty: 1

## 2016-01-18 MED FILL — INSULIN LISPRO 100 UNIT/ML INJECTION: 100 unit/mL | SUBCUTANEOUS | Qty: 1

## 2016-01-18 MED FILL — MAG-AL PLUS 200 MG-200 MG-20 MG/5 ML ORAL SUSPENSION: 200-200-20 mg/5 mL | ORAL | Qty: 30

## 2016-01-18 MED FILL — GUAIFENESIN SR 600 MG TAB: 600 mg | ORAL | Qty: 1

## 2016-01-18 MED FILL — FLUTICASONE 50 MCG/ACTUATION NASAL SPRAY, SUSP: 50 mcg/actuation | NASAL | Qty: 16

## 2016-01-18 MED FILL — GLUCAGEN DIAGNOSTIC KIT 1 MG/ML INJECTION: 1 mg/mL | INTRAMUSCULAR | Qty: 1

## 2016-01-18 MED FILL — CLONIDINE 0.1 MG TAB: 0.1 mg | ORAL | Qty: 2

## 2016-01-18 MED FILL — CYCLOBENZAPRINE 10 MG TAB: 10 mg | ORAL | Qty: 1

## 2016-01-18 MED FILL — PANTOPRAZOLE 40 MG TAB, DELAYED RELEASE: 40 mg | ORAL | Qty: 1

## 2016-01-18 MED FILL — ACETAMINOPHEN 325 MG TABLET: 325 mg | ORAL | Qty: 2

## 2016-01-18 MED FILL — LOSARTAN 50 MG TAB: 50 mg | ORAL | Qty: 1

## 2016-01-18 MED FILL — AZITHROMYCIN 250 MG TAB: 250 mg | ORAL | Qty: 2

## 2016-01-18 MED FILL — LOVENOX 40 MG/0.4 ML SUBCUTANEOUS SYRINGE: 40 mg/0.4 mL | SUBCUTANEOUS | Qty: 0.4

## 2016-01-18 MED FILL — ALPRAZOLAM 0.5 MG TAB: 0.5 mg | ORAL | Qty: 2

## 2016-01-18 MED FILL — BENZONATATE 100 MG CAP: 100 mg | ORAL | Qty: 1

## 2016-01-18 MED FILL — XOPENEX 1.25 MG/3 ML SOLUTION FOR NEBULIZATION: 1.25 mg/3 mL | RESPIRATORY_TRACT | Qty: 3

## 2016-01-18 MED FILL — MORPHINE 2 MG/ML INJECTION: 2 mg/mL | INTRAMUSCULAR | Qty: 1

## 2016-01-18 NOTE — ED Notes (Signed)
Pt sitting in room on phone. Pt asked about arm pain and reports minimal pain decrease to 5/10 from 8/10.

## 2016-01-18 NOTE — Progress Notes (Signed)
Pt c/o left arm pain and nasal pain. FP paged Dr. Katrinka Blazing returned call with order Toradol and Flonase. Pt reports CP has resolved with hot tea.

## 2016-01-18 NOTE — ED Notes (Signed)
FP paged. Pt states returned pain in center of chest and left arm. Pt states pain is the same as reported earlier today. Pt continues with congestion and a cough. EKG performed which showed sinus tach at 112bpm. ER MD signed off on EKG and was scanned to chart by secretary.     FP notified of steps taken and request for pain meds for pt. Pt now stating that her neck is involved and she feels as if she needs to keep moving her neck around due to muscle pain.      Pt given a heating pad and GI cocktail per Dr. Katrinka Blazing.

## 2016-01-18 NOTE — Procedures (Signed)
[facility]  *** FINAL REPORT ***    Name: Mayo, Mary  MRN: [id number]  DOB: 18 Feb 1966  HIS Order #: [id number]  TRAKnet Visit #: [id number]  Date: 17 Jan 2016    TYPE OF TEST: Peripheral Venous Testing    REASON FOR TEST  Obesity, SOB, CP    Right Leg:-  Deep venous thrombosis:           No  Superficial venous thrombosis:    No  Deep venous insufficiency:        No  Superficial venous insufficiency: No    Left Leg:-  Deep venous thrombosis:           No  Superficial venous thrombosis:    No  Deep venous insufficiency:        No  Superficial venous insufficiency: No      INTERPRETATION/FINDINGS  PROCEDURE:  BILATERAL LE VENOUS DUPLEX.  Evaluation of lower extremity veins with ultrasound (B-mode imaging,  pulsed Doppler, color Doppler).  Includes the common femoral, deep  femoral, femoral, popliteal, posterior tibial, peroneal, and great  saphenous veins.    FINDINGS:  Gray scale and color flow duplex images of the veins in  both lower extremities demonstrate normal compressibility, spontaneous   and augmented flow profiles, and absence of filling defects  throughout the deep and superficial veins in both lower extremities.    CONCLUSION:  Bilateral lower extremity venous duplex negative for deep   venous thrombosis or thrombophlebitis.    ADDITIONAL COMMENTS    I have personally reviewed the data relevant to the interpretation of  this  study.    TECHNOLOGIST: Lisa Purdy, RVT  Signed: 01/17/2016 09:10 PM    PHYSICIAN: Sumner Kirchman T. Zondra Lawlor, MD  Signed: 01/20/2016 01:49 PM

## 2016-01-18 NOTE — Progress Notes (Signed)
Chart reviewed for medical necessity. CM to follow for potential discharge needs.

## 2016-01-18 NOTE — Progress Notes (Addendum)
Pt sitting in chair in room with jacket over gown. Pt asks if she can try to get some fresh air by the door because of her congestion. Pt walking with steady gait down hall.     Upon return to room staff were in hall trying to find where the smell of smoke was coming from. Pt in room and this RN asked her if she was smoking in the ED or by the door because her room smelled heavily of cigarette smoke, the patient smiled and looked away.  The pt was advised not to smoke in the ED or by the door because the fire alarms will go off and the fire department would likely respond.  The patient verbalized that she "would not do that again" referring to smoking while in ED.  Charge nurse aware.

## 2016-01-18 NOTE — ED Notes (Signed)
TRANSFER - OUT REPORT:    Verbal report given to Brett Canales RN(name) on SHATORIA STOOKSBURY  being transferred to 4th floor (unit) for routine progression of care       Report consisted of patient???s Situation, Background, Assessment and   Recommendations(SBAR).     Information from the following report(s) SBAR, ED Summary, [facility] and Recent Results was reviewed with the receiving nurse.    Lines:   Peripheral IV 01/17/16 Right Antecubital (Active)   Site Assessment Clean, dry, & intact 01/17/2016 11:50 AM   Phlebitis Assessment 0 01/17/2016 11:50 AM   Infiltration Assessment 0 01/17/2016 11:50 AM   Dressing Status Clean, dry, & intact 01/17/2016 11:50 AM   Dressing Type Tape;Transparent 01/17/2016 11:50 AM   Hub Color/Line Status Pink;Flushed;Patent 01/17/2016 11:50 AM   Action Taken Blood drawn 01/17/2016 11:50 AM   Alcohol Cap Used Yes 01/17/2016 11:50 AM        Opportunity for questions and clarification was provided.      Patient transported with:   Registered Nurse

## 2016-01-18 NOTE — Discharge Summary (Signed)
[facility] FAMILY MEDICINE RESIDENCY PROGRAM                                                                                DISCHARGE SUMMARY     Patient: Mary Mayo  MRN: [id number]  Date of birth: 1965/12/28  Age: 50 y.o.     Date of admission:  01/17/2016  Date of discharge:  01/18/2016  Primary care provider:  Dara Lords, MD   Admitting provider:  Lennox Grumbles, MD    Discharging provider(s): Fidela Salisbury, DO - Family Medicine Resident  Dr. Orvan Falconer, MD -  Family Medicine Attending      Consultations:  IP CONSULT TO CARDIOLOGY  IP CONSULT TO FAMILY PRACTICE    Procedures:  ?? None    Discharge destination: Home.  The patient is stable for discharge.    Admission diagnosis:  Chest pain with high risk of acute coronary syndrome    HPI:   Mary Mayo is a 50 y.o. female with relevant Hx of HTN, DM-2, Asthma, Hyperlipidemia, GERD and Anxiety who presents to the ER complaining of Chest Pain. The pain is mainly located in the Lt side of the chest and Lt arm for several weeks, that became worse this morning. She states that the pain is worse with movement of the Lt arm of in deep inspiration. She also reports a persistent cough since about a week that is related to a brownish sputum without blood and clear/greenish nasal secretions. She lives with her uncle who has been coughing for the last 2 weeks. These symptoms are also related to SOB, chills, sore throat, watery diarrhea w/o blood. Denies fever, diaphoresis, abdominal pain, n/v, leg pain. The patient have a Hx of spinal stenosis with a previous surgery.  ??  In the ER, vital signs were remarkable for HR 112, BP 156/99. Labs were remarkable for Glu 180, D-Dimer 1.23, Trop <0.04. CXR and CTA Chest showed no acute process. Pt was treated with Morphine and Dilaudid.??    Final discharge diagnoses and brief hospital course:   ????  Chest Pain/ACS work up; Risk factors include obesity, history of HTN,  hyperlipidemia.Troponin negative x3. Cardiology evaluated the patient and concluded that the patient is stable from a cardiology stand point. Recommended to follow up OP with stress test. Chest pain controlled with flexeril while in the hospital. Pain was controlled and pt tolerated PO diet prior to discharge.  -Script for Voltaren gel PRN and Flexeril 5 mg TID PRN given for pt's chest pain that was reproducible on exam.   -Follow-up with Cardiology for stress test  -Follow-up with PT for therapy of MSK pain in neck, back, and chest wall.  -Follow-up with PCP for hospital follow-up and discuss BP control (CHANGED Clonidine from 0.2 mg BID to 0.3 mg BID)    URI; Clear CXR and CT Chest. Cough controlled with Mucinex and Tessalon while inpatient. Pt was also started on Azithromycin (day one completed 500 mg).   - Script given for 4 more days of Azithromycin 250 mg.  - Scripts given for cough control: Mucinex 600 mg BID, Tessalon 100 mg PO TID PRN  - Script given for  rhinitis: Flonase qDay  -Follow-up with PCP   ????  Asthma; Stable. Received one treatment of Atrovent and Xopenex treatment. No wheezing on exam on day on discharge. Tolerated room air.  - Follow-up with PCP  ????  Hypertension; Blood pressure while inpatient ranged 150-179/87-116 while taking home medication.   -Continue home Cozaar 25 mg PO qDay  -Script for Clonidine 0.3 mg BID (CHANGED Clonidine from 0.2 mg BID to 0.3 mg BID)  -Follow-up with PCP for BP check and adjust as needed. While inpatient, considered Clonidine patch but it was expensive and did not switch as pt may not be able to afford.   ????  DM; HgA1C 6.9 (01/07/16).   - Continue home Metformin 1,000 mg BID.  - Follow-up with PCP    ????  Hyperlipidemia; Lipid Panel (01/07/16) Tchol: 221 LDL: 154 HDL: 31 Trigs: 181  - Continue Lipitor 10 mg every day  ????  GERD; Stable. At home Prilosec 40 mg BID  - Continue Prilosec after discharge    Anxiety; Stable.    - Continue home Xanax 1 mg at night and home Trazodone 100 mg at night    Tobacco abuse; Smoking cessation education given to pt while inpatient. Nicotine patch offered but pt did not desire.  - Will follow-up with PCP when she is ready to quit    Obesity; Healthy diets and exercise discussed with patient. Pt plans to attempt to be more active.       Follow-up Cares:   ?? Pending LABS at the time of Discharge: None  ?? Labs Need to Repeat by PCP: Blood pressure    Follow-up Information     Follow up With Details Comments Contact Info    Dara Lords, MD Schedule an appointment as soon as possible for a visit in 2 days Hospital follow-up. Discuss blood pressure. While in hospital Clonadine changed from .2 BID to .3 BID for better blood pressure control [address]  [address]  [facility]  [place] [address]  [phone]       Schedule an appointment as soon as possible for a visit in 2 days Referral to Physical therapy for neck and back pain. [facility]    Phone: [phone]    Darnelle Spangle, MD  Stress test [address]  [place] [address]  [phone]              Physical Examination at Discharge:     Visit Vitals   ??? BP 165/56 (BP 1 Location: Right arm, BP Patient Position: At rest)   ??? Pulse 98   ??? Temp 98.5 ??F (36.9 ??C)   ??? Resp 19   ??? Ht  (1.575 m)   ??? Wt 218 lb (98.9 kg)   ??? SpO2 99%   ??? BMI 39.87 kg/m2       General: No acute distress. Alert. Cooperative. Coughing.   Head: Normocephalic. Atraumatic.   Eyes:  Conjunctiva pink. Sclera white. PERRL.   Ears:  Ear canals patent. TM non-erythematous.   Nose:  Septum midline. Mucosa pink. No drainage.   Throat: Mucosa pink. Moist mucous membranes. No tonsillar exudates or erythema. Palate movement equal bilaterally.   Neck: Supple. Normal ROM. No stiffness.   Respiratory: CTA. No w/r/r/c.    Cardiovascular:  Normal S1,S2. No m/r/g. Pulses 2+ throughout. Chest pain reproducible with palpation    GI: + bowel sounds. Nontender. No rebound tenderness or guarding.  Nondistended.   Extremities: No edema. No palpable cord. No tenderness.   Musculoskeletal: Full ROM in all extremities.   Neuro: CN II-XII grossly intact. Strength 5/5 in all extremities. Sensation intact in all extremities. DTRs 2+ throughout.   Skin: Clear. No rashes. No ulcers.         Pertinent Imaging Studies:     Xr Chest Pa Lat    Result Date: 01/17/2016  Exam:  2 view chest Indication: Chest pain, shortness of breath and congestion x1 week. COMPARISON: 01/15/2009 PA and lateral views demonstrate normal heart size. The patient is on a cardiac monitor. Lungs well aerated and clear. There is a minimal linear scar in the left mid to lower lung zone laterally. Patient status post cervical spine surgery.     IMPRESSION: 1. No acute process    Xr Spine Cerv 4 Or 5 V    Result Date: 01/18/2016  CLINICAL HISTORY: Neck pain, status post fusion INDICATION: Neck pain after fusion COMPARISON: MR from 09/25/2013  FINDINGS: 4 views of the cervical spine are obtained. ACDF at C5-6 with orthopedic hardware intact. No evidence of fracture or dislocation. The spinal alignment is normal.  Vertebral morphology is normal.  The intervertebral disc height is well-preserved. There are no identifiable paravertebral soft tissue abnormalities. Prevertebral soft tissues unremarkable. Atlanto-dental interval within normal limits. No evidence of subluxation. No osseous neural foraminal stenosis.     IMPRESSION: ACDF at C5-C6. Orthopedic hardware intact. No fracture     Cta Chest W Wo Cont    Result Date: 01/17/2016  EXAM:  CTA CHEST W WO CONT INDICATION:  Chest congestion with cough and shortness of breath for one week. COMPARISON: Chest radiographs 01/17/2016 TECHNIQUE: Helical thin section chest CT following uneventful intravenous administration of nonionic contrast 90 mL of Isovue-370 according to departmental PE protocol. Coronal and sagittal reformats were performed.  3D/MIP post processing was performed. CT dose reduction was achieved through use of a standardized protocol tailored for this examination and automatic exposure control for dose modulation. FINDINGS: This is a good quality study for the evaluation of pulmonary embolism to the first subsegmental arterial level. There is no pulmonary embolism to this level. The aorta and main pulmonary artery are normal in caliber. Cardiac size is within normal limits. No pericardial effusion. There are prominent but nonenlarged mediastinal lymph nodes. There are no enlarged axillary or hilar lymph nodes. There is mild bilateral dependent atelectasis. There is no lung nodule, mass, or consolidation. No pleural effusion or pneumothorax. Central airways are unremarkable. Limited images of the upper abdomen are within normal limits. The bony structures are age-appropriate     IMPRESSION: There is no acute pulmonary embolism or other acute abnormality in the chest.     ---------------------------------    Discharge Medications:      Discharge Medication List as of 01/18/2016  2:13 PM      START these NEW medications     Details   azithromycin (ZITHROMAX) 250 mg tablet Take 1 Tab by mouth daily for 4 days. Take 1 tablet daily starting Sunday 01/19/2016., Print, Disp-4 Tab, R-0      benzonatate (TESSALON) 100 mg capsule Take 1 Cap by mouth three (3) times daily as needed for Cough., Print, Disp-30 Cap, R-0      cloNIDine HCl (CATAPRES) 0.2 mg tablet Take 1.5 Tabs by mouth two (2) times a day., Print, Disp-30 Tab, R-0      cyclobenzaprine (FLEXERIL) 10 mg tablet Take 0.5 Tabs by mouth three (  3) times daily as needed for Muscle Spasm(s)., Print, Disp-30 Tab, R-0      guaiFENesin SR (MUCINEX) 600 mg SR tablet Take 1 Tab by mouth every twelve (12) hours., Print, Disp-30 Tab, R-0      fluticasone (FLONASE) 50 mcg/actuation nasal spray 2 Sprays by Both Nostrils route daily. Indications: CHRONIC NON-ALLERGIC RHINITIS, Print, Disp-1 Bottle, R-0       diclofenac (VOLTAREN) 1 % gel Apply 2 g to affected area four (4) times daily., Print, Disp-100 g, R-0         CONTINUE these medications which have NOT CHANGED    Details   atorvastatin (LIPITOR) 10 mg tablet Take 1 Tab by mouth daily., Normal, Disp-90 Tab, R-1      ALPRAZolam (XANAX) 1 mg tablet Take 1 Tab by mouth two (2) times daily as needed for Anxiety. Max Daily Amount: 2 mg., Print, Disp-60 Tab, R-2      losartan (COZAAR) 25 mg tablet Take 1 Tab by mouth daily., Normal, Disp-30 Tab, R-5      omeprazole (PRILOSEC) 40 mg capsule Take 1 Cap by mouth two (2) times a day., Normal, Disp-60 Cap, R-2      traZODone (DESYREL) 100 mg tablet Take 1 Tab by mouth nightly., Normal, Disp-30 Tab, R-11      metFORMIN ER (GLUCOPHAGE XR) 500 mg tablet Take 2 Tabs by mouth two (2) times daily (after meals)., Normal, Disp-120 Tab, R-11      hydrOXYzine (ATARAX) 50 mg tablet Take 1 Tab by mouth every six (6) hours as needed for Itching., Normal, Disp-90 Tab, R-2      EPINEPHrine (EPIPEN 2-PAK) 0.3 mg/0.3 mL injection 0.3 mL by IntraMUSCular route once as needed for up to 1 dose. If used, seek immediate medical attention., Normal, Disp-2 Syringe, R-0             Chronic Diagnoses:    Problem List as of 01/18/2016  Date Reviewed: 19-Jan-2016          Codes Class Noted - Resolved    Musculoskeletal chest pain ICD-10-CM: R07.89  ICD-9-CM: 786.59  01/18/2016 - Present        Musculoskeletal neck pain ICD-10-CM: M54.2  ICD-9-CM: 723.1  01/18/2016 - Present        Shortness of breath ICD-10-CM: R06.02  ICD-9-CM: 786.05  01/19/16 - Present        Acute non-recurrent maxillary sinusitis ICD-10-CM: J01.00  ICD-9-CM: 461.0  01-19-2016 - Present        Tachycardia ICD-10-CM: R00.0  ICD-9-CM: 785.0  19-Jan-2016 - Present        * (Principal)Chest pain with high risk of acute coronary syndrome ICD-10-CM: R07.9  ICD-9-CM: 786.50  2016-01-19 - Present        Positive D dimer ICD-10-CM: R79.1  ICD-9-CM: 790.92  2016-01-19 - Present         Frequent headaches ICD-10-CM: R51  ICD-9-CM: 784.0  01/07/2016 - Present        Screening for thyroid disorder ICD-10-CM: Z13.29  ICD-9-CM: V77.0  01/07/2016 - Present        Screening for colon cancer ICD-10-CM: Z12.11  ICD-9-CM: V76.51  01/07/2016 - Present        Cervical stenosis of spine ICD-10-CM: M48.02  ICD-9-CM: 723.0  02/05/2014 - Present        Spinal stenosis ICD-10-CM: M48.00  ICD-9-CM: 724.00  02/05/2014 - Present        Tobacco use disorder ICD-10-CM: F17.200  ICD-9-CM: 305.1  04/29/2012 - Present  Unspecified vitamin D deficiency ICD-10-CM: E55.9  ICD-9-CM: 268.9  03/31/2012 - Present        Lumbar spondylosis ICD-10-CM: M47.816  ICD-9-CM: 721.3  06/01/2011 - Present        Microalbuminuria ICD-10-CM: R80.9  ICD-9-CM: 791.0  04/30/2011 - Present        Encounter for long-term (current) use of other medications ICD-10-CM: Z79.899  ICD-9-CM: V58.69  04/01/2011 - Present        Hidradenitis suppurativa ICD-10-CM: L73.2  ICD-9-CM: 705.83  12/04/2010 - Present        Dysthymic disorder ICD-10-CM: F34.1  ICD-9-CM: 300.4  08/06/2010 - Present        Mixed hyperlipidemia ICD-10-CM: E78.2  ICD-9-CM: 272.2  01/10/2010 - Present        Migraine with aura, without mention of intractable migraine without mention of status migrainosus ICD-10-CM: G43.109  ICD-9-CM: 346.00  11/12/2009 - Present        Allergic rhinitis due to other allergen ICD-10-CM: J30.89  ICD-9-CM: 477.8  10/11/2009 - Present        Cyst ICD-9-CM: IMO0001  Unknown - Present    Overview Signed 08/20/2009  4:20 PM by Laymond Purser     excision of a chronic abscess of left axilla             Morbid obesity (HCC) ICD-10-CM: E66.01  ICD-9-CM: 278.01  07/05/2009 - Present        Unspecified sleep apnea ICD-10-CM: G47.30  ICD-9-CM: 780.57  06/14/2009 - Present        Tachycardia, unspecified ICD-10-CM: R00.0  ICD-9-CM: 785.0  03/04/2009 - Present        Type 2 diabetes mellitus without complication (HCC) ICD-10-CM: E11.9   ICD-9-CM: 250.00  02/04/2009 - Present        Urge incontinence ICD-10-CM: N39.41  ICD-9-CM: 788.31  10/31/2008 - Present        Esophageal reflux ICD-10-CM: K21.9  ICD-9-CM: 530.81  07/11/2008 - Present        Lumbago ICD-10-CM: M54.5  ICD-9-CM: 724.2  07/11/2008 - Present        Essential hypertension, benign ICD-10-CM: I10  ICD-9-CM: 401.1  07/11/2008 - Present        Bipolar 1 disorder (HCC) ICD-10-CM: F31.9  ICD-9-CM: 296.7  07/11/2008 - Present        RESOLVED: Pure hypercholesterolemia ICD-10-CM: E78.00  ICD-9-CM: 272.0  12/18/2008 - 01/10/2010        RESOLVED: Type II or unspecified type diabetes mellitus without mention of complication, not stated as uncontrolled ICD-10-CM: E11.9  ICD-9-CM: 250.00  12/04/2008 - 12/18/2008              Signed:      Fidela Salisbury, DO   Family Medicine Resident      01/18/2016   1:30 PM     Cc: Dara Lords, MD

## 2016-01-18 NOTE — ED Notes (Signed)
Report given to Anne, RN

## 2016-01-20 ENCOUNTER — Ambulatory Visit: Admit: 2016-01-20 | Discharge: 2016-01-20 | Payer: MEDICARE | Attending: Specialist | Primary: Family Medicine

## 2016-01-20 DIAGNOSIS — R519 Headache, unspecified: Secondary | ICD-10-CM

## 2016-01-20 LAB — EKG, 12 LEAD, INITIAL
Atrial Rate: 116 {beats}/min
Calculated P Axis: 65 degrees
Calculated R Axis: 15 degrees
Calculated T Axis: 64 degrees
P-R Interval: 134 ms
Q-T Interval: 332 ms
QRS Duration: 84 ms
QTC Calculation (Bezet): 461 ms
Ventricular Rate: 116 {beats}/min

## 2016-01-20 MED ORDER — TOPIRAMATE 25 MG TAB
25 mg | ORAL_TABLET | ORAL | 6 refills | Status: AC
Start: 2016-01-20 — End: ?

## 2016-01-20 NOTE — Progress Notes (Signed)
Reviewed record in preparation for visit and have necessary documentation  Pt did not bring medication to office visit for review  Information was given to pt on Advanced Directives, Living Will  opportunity was given for questions

## 2016-01-20 NOTE — Progress Notes (Signed)
Neurology Consult      Subjective:      Mary Mayo is a 50 y.o. female African-American who comes in with a headache history as follows.  Says she has had 5 years worth of headaches.  Before that very random occasional and of no particular consequence.  Headaches have intensified in the last 2 years and is concerned about the stress and tension component.  In recent times has been daily.  The onset is fast.  It has a random location between occipital to frontal with conjoined shoulder and neck stiffness and discomfort.  Can last less than or equal to an hour and Tylenol seems to help.  Cannot take NSAIDs/ASA due to adverse events.  Notes occasional visual blurring with headache.  Other than tension and stress knows of nothing that makes it worse.  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  When she is more on her own and pursuing her interest she feels better.  Sleep is chronically restless.  Family history is positive for her mother who had brain aneurysms and deceased from the same.  Also a maternal cousin with brain aneurysm.  Patient smokes and on face value told her to stop smoking and the relationship with aneurysms.  Remote MRI of the cervical spines in September 2014 showed mid neck degenerative joint disc disease C3-4 through 5 6.  Takes trazodone nightly as a sleep aid.         Current Outpatient Prescriptions   Medication Sig Dispense Refill   ??? acetaminophen (TYLENOL EXTRA STRENGTH) 500 mg tablet Take  by mouth every six (6) hours as needed for Pain.     ??? topiramate (TOPAMAX) 25 mg tablet 1 po qhs 30 Tab 6   ??? azithromycin (ZITHROMAX) 250 mg tablet Take 1 Tab by mouth daily for 4 days. Take 1 tablet daily starting Sunday 01/19/2016. 4 Tab 0   ??? benzonatate (TESSALON) 100 mg capsule Take 1 Cap by mouth three (3) times daily as needed for Cough. 30 Cap 0    ??? cloNIDine HCl (CATAPRES) 0.2 mg tablet Take 1.5 Tabs by mouth two (2) times a day. 30 Tab 0   ??? cyclobenzaprine (FLEXERIL) 10 mg tablet Take 0.5 Tabs by mouth three (3) times daily as needed for Muscle Spasm(s). 30 Tab 0   ??? fluticasone (FLONASE) 50 mcg/actuation nasal spray 2 Sprays by Both Nostrils route daily. Indications: CHRONIC NON-ALLERGIC RHINITIS 1 Bottle 0   ??? diclofenac (VOLTAREN) 1 % gel Apply 2 g to affected area four (4) times daily. 100 g 0   ??? atorvastatin (LIPITOR) 10 mg tablet Take 1 Tab by mouth daily. 90 Tab 1   ??? ALPRAZolam (XANAX) 1 mg tablet Take 1 Tab by mouth two (2) times daily as needed for Anxiety. Max Daily Amount: 2 mg. 60 Tab 2   ??? EPINEPHrine (EPIPEN 2-PAK) 0.3 mg/0.3 mL injection 0.3 mL by IntraMUSCular route once as needed for up to 1 dose. If used, seek immediate medical attention. 2 Syringe 0   ??? losartan (COZAAR) 25 mg tablet Take 1 Tab by mouth daily. 30 Tab 5   ??? omeprazole (PRILOSEC) 40 mg capsule Take 1 Cap by mouth two (2) times a day. 60 Cap 2   ??? traZODone (DESYREL) 100 mg tablet Take 1 Tab by mouth nightly. 30 Tab 11   ??? metFORMIN ER (GLUCOPHAGE XR) 500 mg tablet  Take 2 Tabs by mouth two (2) times daily (after meals). 120 Tab 11   ??? hydrOXYzine (ATARAX) 50 mg tablet Take 1 Tab by mouth every six (6) hours as needed for Itching. 90 Tab 2      Allergies   Allergen Reactions   ??? Niaspan [Niacin] Other (comments)     Facial flushing and felt hot   ??? Aspirin Hives and Nausea and Vomiting   ??? Ibuprofen Swelling     Hand and feet swelling     Past Medical History   Diagnosis Date   ??? Arthritis      spinal stenosis    ??? Asthma      last uses inhaler 2004   ??? Bipolar Affective, Depress, Unspec 07/11/2008   ??? Cyst      excision of a chronic abscess of left axilla   ??? Diabetes (HCC)      type 2   ??? Dysthymic disorder 08/06/2010   ??? GERD (gastroesophageal reflux disease)    ??? Headache    ??? Hypercholesterolemia    ??? Hypertension    ??? Ill-defined condition      vitamin d deficiency    ??? Ill-defined condition      season allergies   ??? Ill-defined condition      high cholesterol   ??? Psychotic disorder      bipolar   ??? Unspecified sleep apnea      last used 5 years ago- discontinued due to weight loss      Past Surgical History   Procedure Laterality Date   ??? Hx wisdom teeth extraction     ??? Hx tonsillectomy     ??? Hx gyn  2004     hysterectomy   ??? Hx orthopaedic  12-2012     left foot heel spur      Social History     Social History   ??? Marital status: DIVORCED     Spouse name: N/A   ??? Number of children: 3   ??? Years of education: 39     Occupational History   ??? Unemployed      Social History Main Topics   ??? Smoking status: Current Every Day Smoker     Packs/day: 0.50     Years: 30.00     Types: Cigarettes   ??? Smokeless tobacco: Never Used   ??? Alcohol use 1.0 oz/week     2 Standard drinks or equivalent per week      Comment: socially   ??? Drug use: Yes     Special: Marijuana      Comment: last used 2 weeks ago   ??? Sexual activity: Not Currently     Other Topics Concern   ??? Not on file     Social History Narrative      Family History   Problem Relation Age of Onset   ??? Adopted: Yes   ??? Diabetes Mother    ??? Cancer Mother    ??? Hypertension Mother    ??? Hypertension Father       Visit Vitals   ??? BP 148/90   ??? Pulse (!) 102   ??? Temp 98.6 ??F (37 ??C) (Oral)   ??? Resp 22   ??? Ht  (1.575 m)   ??? Wt 99.2 kg (218 lb 11.2 oz)   ??? SpO2 98%   ??? BMI 40 kg/m2        Review of Systems:   A comprehensive  review of systems was negative except for that written in the HPI.      Neuro Exam:     Appearance:  The patient is well developed, well nourished, provides a coherent history and is in no acute distress.   Mental Status: Oriented to time, place and person. Mood and affect appropriate.   Cranial Nerves:   Intact visual fields. Fundi are benign. PERLA, EOM's full, no nystagmus, no ptosis. Facial sensation is normal. Corneal reflexes are intact. Facial movement is symmetric. Hearing is normal  bilaterally. Palate is midline with normal sternocleidomastoid and trapezius muscles are normal. Tongue is midline.   Motor:  5/5 strength in upper and lower proximal and distal muscles. Normal bulk and tone. No fasciculations.   Reflexes:   Deep tendon reflexes 2+/4 and symmetrical.   Sensory:   Normal to touch, pinprick and vibration.   Gait:  Normal gait.   Tremor:   No tremor noted.   Cerebellar:  No cerebellar signs present.   Neurovascular:  Normal heart sounds and regular rhythm, peripheral pulses intact, and no carotid bruits.            Assessment:   Mixed headaches.  Has definite stress and tension in her life and attention to that aspect will help.  Improve sleep quality as well.  Already on trazodone and will add Topamax for the vascular headache part 25 mg nightly.  Currently happy with Tylenol as rescue.  Please see progress notes.  Two maternal family members with brain aneurysms.  Will get brain MRI and MRA.  Stop smoking.  Is welcome to keep a headache diary.    Plan:   Revisit in about 6 weeks.  Signed by :  Chesley Noon IV MD

## 2016-01-20 NOTE — Procedures (Signed)
[facility]  *** FINAL REPORT ***    Name: Mary Mayo, Mary Mayo  MRN: [id number]  DOB: 04-Apr-1966  HIS Order #: [id number]  TRAKnet Visit #: [id number]  Date: 17 Jan 2016    TYPE OF TEST: Peripheral Venous Testing    REASON FOR TEST  Obesity, SOB, CP    Right Leg:-  Deep venous thrombosis:           No  Superficial venous thrombosis:    No  Deep venous insufficiency:        No  Superficial venous insufficiency: No    Left Leg:-  Deep venous thrombosis:           No  Superficial venous thrombosis:    No  Deep venous insufficiency:        No  Superficial venous insufficiency: No      INTERPRETATION/FINDINGS  PROCEDURE:  BILATERAL LE VENOUS DUPLEX.  Evaluation of lower extremity veins with ultrasound (B-mode imaging,  pulsed Doppler, color Doppler).  Includes the common femoral, deep  femoral, femoral, popliteal, posterior tibial, peroneal, and great  saphenous veins.    FINDINGS:  Wallace Cullens scale and color flow duplex images of the veins in  both lower extremities demonstrate normal compressibility, spontaneous   and augmented flow profiles, and absence of filling defects  throughout the deep and superficial veins in both lower extremities.    CONCLUSION:  Bilateral lower extremity venous duplex negative for deep   venous thrombosis or thrombophlebitis.    ADDITIONAL COMMENTS    I have personally reviewed the data relevant to the interpretation of  this  study.    TECHNOLOGIST: Stephanie Acre, RVT  Signed: 01/17/2016 09:10 PM    PHYSICIAN: Vernia Buff T. Sheliah Hatch, MD  Signed: 01/20/2016 01:49 PM

## 2016-01-20 NOTE — Patient Instructions (Addendum)
Information Regarding Testing     If you have physican order for a test or a medication denied by your insurance company, this does not mean the test or medication is not appropriate for you as that is a medical decision, not a decision to be made by an insurance company representative or by an [facility] physician who has not interviewed and examined you. This is a decision to be made between you and your physician.     The denial of services is a Education administrator between you and your insurance company, not an issue between your physician and the insurance company. If your test or medication is denied, you can take the following steps to help resolve the issue:    1.  File a complaint with the [employer] regarding your insurance company's denial of services ordered for you.  You can do this either by calling them directly or by completing an on-line complaint form on the [employer].  This can be found at [email or web address]    2.   Also file a formal complaint with your insurance company and ask to have the name of the person denying the service so that you may explore a legal option should you be harmed by this denial of service.  Again, the fact the insurance company will not pay for the service does not mean it is not medically necessary and I would encourage you to follow through with the plan that was made with your physician    3.   File a written complaint with your employer so your employer and benefit manager is aware of the poor coverage they are providing their employees.  If you have medicare/medicaid, complain to your representative in the House and to your Neal.                 Learning About Living Eugenie Birks  What is a living will?  A living will is a legal form you use to write down the kind of care you want at the end of your life. It is used by the health professionals who will treat you if you aren't able to decide for yourself.   If you put your wishes in writing, your loved ones and others will know what kind of care you want. They won't need to guess. This can ease your mind and be helpful to others.  A living will is not the same as an estate or property will. An estate will explains what you want to happen with your money and property after you die.  Is a living will a legal document?  A living will is a legal document. Each state has its own laws about living wills. If you move to another state, make sure that your living will is legal in the state where you now live. Or you might use a universal form that has been approved by many states. This kind of form can sometimes be completed and stored online. Your electronic copy will then be available wherever you have a connection to the Internet. In most cases, doctors will respect your wishes even if you have a form from a different state.  ?? You don't need an attorney to complete a living will. But legal advice can be helpful if your state's laws are unclear, your health history is complicated, or your family can't agree on what should be in your living will.  ?? You can change your living will at any  time. Some people find that their wishes about end-of-life care change as their health changes.  ?? In addition to making a living will, think about completing a medical power of attorney form. This form lets you name the person you want to make end-of-life treatment decisions for you (your "health care agent") if you're not able to. Many hospitals and nursing homes will give you the forms you need to complete a living will and a medical power of attorney.  ?? Your living will is used only if you can't make or communicate decisions for yourself anymore. If you become able to make decisions again, you can accept or refuse any treatment, no matter what you wrote in your living will.  ?? Your state may offer an online registry. This is a place where you can  store your living will online so the doctors and nurses who need to treat you can find it right away.  What should you think about when creating a living will?  Talk about your end-of-life wishes with your family members and your doctor. Let them know what you want. That way the people making decisions for you won't be surprised by your choices.  Think about these questions as you make your living will:  ?? Do you know enough about life support methods that might be used? If not, talk to your doctor so you know what might be done if you can't breathe on your own, your heart stops, or you're unable to swallow.  ?? What things would you still want to be able to do after you receive life-support methods? Would you want to be able to walk? To speak? To eat on your own? To live without the help of machines?  ?? If you have a choice, where do you want to be cared for? In your home? At a hospital or nursing home?  ?? Do you want certain religious practices performed if you become very ill?  ?? If you have a choice at the end of your life, where would you prefer to die? At home? In a hospital or nursing home? Somewhere else?  ?? Would you prefer to be buried or cremated?  ?? Do you want your organs to be donated after you die?  What should you do with your living will?  ?? Make sure that your family members and your health care agent have copies of your living will.  ?? Give your doctor a copy of your living will to keep in your medical record. If you have more than one doctor, make sure that each one has a copy.  ?? You may want to put a copy of your living will where it can be easily found.  Where can you learn more?  Go to InsuranceStats.ca.  Enter [phone] in the search box to learn more about "Learning About Living Wills."  Current as of: February 13, 2015  Content Version: 11.1  ?? 2006-2016 [employer]. Care instructions adapted under  license by [employer] (which disclaims liability or warranty for this information). If you have questions about a medical condition or this instruction, always ask your healthcare professional. [employer] Inc[employer] disclaims any warranty or liability for your use of this information.  Agree with patient that she has stress in her life and attention to that aspect and improved sleep may help.  And may have a mixed migraine component as well.  Already on trazodone.  Will put Topamax 25 mg at night in place.  Tylenol seems to work acutely for headache relief.

## 2016-01-21 NOTE — Patient Instructions (Signed)
See Above

## 2016-01-21 NOTE — Telephone Encounter (Signed)
[facility] PA form w/ clinical notes faxed regarding Topiramate  #30/30 days

## 2016-01-21 NOTE — Progress Notes (Signed)
NNTOCIP    Hospital Discharge Follow-Up      Date/Time:  01/21/2016 9:11 AM    Patient listed on discharge [facility]) report on 01/18/16.  Patient discharged from [facility]. Thelma Barge for Chest Pain.    RRAT score: 12   Moderate     Medical Chart Reviewed.    Medical History:     Past Medical History   Diagnosis Date   ??? Arthritis      spinal stenosis    ??? Asthma      last uses inhaler 2004   ??? Bipolar Affective, Depress, Unspec 07/11/2008   ??? Cyst      excision of a chronic abscess of left axilla   ??? Diabetes (HCC)      type 2   ??? Dysthymic disorder 08/06/2010   ??? GERD (gastroesophageal reflux disease)    ??? Headache    ??? Hypercholesterolemia    ??? Hypertension    ??? Ill-defined condition      vitamin d deficiency   ??? Ill-defined condition      season allergies   ??? Ill-defined condition      high cholesterol   ??? Psychotic disorder      bipolar   ??? Unspecified sleep apnea      last used 5 years ago- discontinued due to weight loss       Nurse Navigator(NN) contacted the patient by telephone to perform post hospitalED) discharge assessment.  Verified DOB and address with patient as identifiers.  Provided introduction to self, and explanation of the Nurses Navigator role.  Pt went to [facility]  ED on 01/17/16  For Chest Pain from a chest cold and coughing.  She was put on a Z pak and cough medication.  She says that her coughing is reduced and that she no longer has any chest pain.      Diet:   Patient reports: Diabetic Diet    Activity:    Patient reports: somewalking outside the house    Medication:   Performed medication reconciliation with patient, and patient verbalizes understanding of administration of home medications.  There were no barriers to obtaining medications identified at this time.    Support system:  patient and sister    Discharge Instructions :  Reviewed discharge instructions with patient.  Patient verbalizes understanding of discharge instructions and follow-up care.       Red Flags:   Further Chest Pain, Arm pain, shoulder or back pain  SOB/DOE  Nausea or Vomiting  Dizzyness    Labs Reviewed:  No results for input(s): WBC, HGB, HCT, PLT, HGBEXT, HCTEXT, PLTEXT in the last 72 hours.  No results for input(s): NA, K, CL, CO2, GLU, BUN, CREA, CA, MG, PHOS, ALB, TBIL, SGOT, ALT, INR in the last 72 hours.    No lab exists for component: INREXT  No components found for: GLPOC  No results for input(s): PH, PCO2, PO2, HCO3, FIO2 in the last 72 hours.  No results for input(s): INR in the last 72 hours.    No lab exists for component: INREXT    Imaging results reviewed:      Advance Care Planning:   Patient was offered the opportunity to discuss advance care planning:  no     Does patient have an Advance Directive:  no   If no, did you provide information on [facility]?  no     PCP/Specialist follow up: Patient refused to schedule a  follow up apt with  Dara Lords, MD.  She did not feel the appointment was necessary.  She said that she will call the office when she feels she needs to be seen by a Doctor.     Reviewed red flags with patient, and patient verbalizes understanding.  Patient given an opportunity to ask questions. No other clinical/social/functional needs noted.   The patient agrees to contact the PCP office for questions related to their healthcare.  The patient expressed thanks, offered no additional questions and ended the call.     Case management plan: Attempt to contact the patient by telephone or during office visit within the next 7-10 days. Will continue to follow as necessary for the next 30 days. Will reassess for case management needs prior to discharge from case management service on or about 30 days.

## 2016-01-23 MED ORDER — TRAZODONE 100 MG TAB
100 mg | ORAL_TABLET | Freq: Every evening | ORAL | 3 refills | Status: DC
Start: 2016-01-23 — End: 2016-08-21

## 2016-01-23 MED ORDER — METFORMIN SR 500 MG 24 HR TABLET
500 mg | ORAL_TABLET | Freq: Two times a day (BID) | ORAL | 11 refills | Status: AC
Start: 2016-01-23 — End: ?

## 2016-01-28 NOTE — Telephone Encounter (Signed)
Received [facility]  PA approval letter regarding Topiramate 25 mg #30/30 days   Auth# [id number]  Effective:01/24/16-01/23/17

## 2016-01-30 ENCOUNTER — Inpatient Hospital Stay: Admit: 2016-01-30 | Payer: MEDICARE | Attending: Specialist | Primary: Family Medicine

## 2016-01-30 DIAGNOSIS — R51 Headache: Secondary | ICD-10-CM

## 2016-01-30 DIAGNOSIS — R519 Headache, unspecified: Secondary | ICD-10-CM

## 2016-01-30 NOTE — Progress Notes (Signed)
Mary Mayo, MRA with degradation on films according to radiologist. Suggesting CTA of brain and I can order if interested. [email or web address]

## 2016-02-03 ENCOUNTER — Encounter: Attending: Family Medicine | Primary: Family Medicine

## 2016-03-02 ENCOUNTER — Ambulatory Visit: Attending: Specialist | Primary: Family Medicine

## 2016-03-02 NOTE — Progress Notes (Signed)
No show

## 2016-03-25 ENCOUNTER — Inpatient Hospital Stay
Admit: 2016-03-25 | Discharge: 2016-03-25 | Disposition: A | Discharge status: Home / Self Care | Payer: MEDICARE | Attending: Emergency Medicine

## 2016-03-25 DIAGNOSIS — T7840XA Allergy, unspecified, initial encounter: Secondary | ICD-10-CM

## 2016-03-25 MED ORDER — DIPHENHYDRAMINE HCL 50 MG/ML IJ SOLN
50 mg/mL | INTRAMUSCULAR | Status: AC
Start: 2016-03-25 — End: 2016-03-25
  Administered 2016-03-25: 21:00:00 via INTRAVENOUS

## 2016-03-25 MED ORDER — FAMOTIDINE (PF) 20 MG/2 ML IV
20 mg/2 mL | INTRAVENOUS | Status: AC
Start: 2016-03-25 — End: 2016-03-25
  Administered 2016-03-25: 21:00:00 via INTRAVENOUS

## 2016-03-25 MED ORDER — HYDROCODONE-ACETAMINOPHEN 5 MG-325 MG TAB
5-325 mg | ORAL | Status: AC
Start: 2016-03-25 — End: 2016-03-25
  Administered 2016-03-25: 22:00:00 via ORAL

## 2016-03-25 MED ORDER — DIPHENHYDRAMINE 25 MG CAP
25 mg | ORAL_CAPSULE | Freq: Four times a day (QID) | ORAL | 0 refills | Status: AC | PRN
Start: 2016-03-25 — End: 2016-04-04

## 2016-03-25 MED ORDER — FAMOTIDINE 20 MG TAB
20 mg | ORAL_TABLET | Freq: Two times a day (BID) | ORAL | 0 refills | Status: AC
Start: 2016-03-25 — End: ?

## 2016-03-25 MED ORDER — METHYLPREDNISOLONE 4 MG TABS IN A DOSE PACK
4 mg | ORAL | 0 refills | Status: DC
Start: 2016-03-25 — End: 2016-05-27

## 2016-03-25 MED ORDER — DEXAMETHASONE SODIUM PHOSPHATE 4 MG/ML IJ SOLN
4 mg/mL | INTRAMUSCULAR | Status: AC
Start: 2016-03-25 — End: 2016-03-25
  Administered 2016-03-25: 21:00:00 via INTRAVENOUS

## 2016-03-25 MED FILL — DEXAMETHASONE SODIUM PHOSPHATE 4 MG/ML IJ SOLN: 4 mg/mL | INTRAMUSCULAR | Qty: 3

## 2016-03-25 MED FILL — FAMOTIDINE (PF) 20 MG/2 ML IV: 20 mg/2 mL | INTRAVENOUS | Qty: 2

## 2016-03-25 MED FILL — DIPHENHYDRAMINE HCL 50 MG/ML IJ SOLN: 50 mg/mL | INTRAMUSCULAR | Qty: 1

## 2016-03-25 MED FILL — HYDROCODONE-ACETAMINOPHEN 5 MG-325 MG TAB: 5-325 mg | ORAL | Qty: 1

## 2016-03-25 NOTE — ED Notes (Signed)
Patient resting on stretcher in no distress.  Patient vital signs updated.  Patient has been medicated for pain; tolerated PO medication well.  Patient states she "is still itchy, but it is better".  Calling [employer] for patient transport home.

## 2016-03-25 NOTE — ED Provider Notes (Signed)
Patient is a 50 y.o. female presenting with allergic reaction. The history is provided by the patient and the EMS personnel.   Allergic Reaction    This is a new problem. Episode onset: less than 1 hour PTA. The problem has been gradually worsening. Ingested substance: shrimp with green beans - seasoned. Pertinent negatives include no slurred speech, no nausea, no vomiting, no depression and no shortness of breath. Associated symptoms comments: Itching and rash reported.        Past Medical History:   Diagnosis Date   ??? Arthritis     spinal stenosis    ??? Asthma     last uses inhaler 2004   ??? Bipolar Affective, Depress, Unspec 07/11/2008   ??? Cyst     excision of a chronic abscess of left axilla   ??? Diabetes (HCC)     type 2   ??? Dysthymic disorder 08/06/2010   ??? GERD (gastroesophageal reflux disease)    ??? Headache    ??? Hypercholesterolemia    ??? Hypertension    ??? Ill-defined condition     vitamin d deficiency   ??? Ill-defined condition     season allergies   ??? Ill-defined condition     high cholesterol   ??? Psychotic disorder     bipolar   ??? Unspecified sleep apnea     last used 5 years ago- discontinued due to weight loss       Past Surgical History:   Procedure Laterality Date   ??? HX GYN  2004    hysterectomy   ??? HX ORTHOPAEDIC  12-2012    left foot heel spur   ??? HX TONSILLECTOMY     ??? HX WISDOM TEETH EXTRACTION           Family History:   Problem Relation Age of Onset   ??? Adopted: Yes   ??? Diabetes Mother    ??? Cancer Mother    ??? Hypertension Mother    ??? Hypertension Father        Social History     Social History   ??? Marital status: DIVORCED     Spouse name: N/A   ??? Number of children: 3   ??? Years of education: 61     Occupational History   ??? Unemployed      Social History Main Topics   ??? Smoking status: Current Every Day Smoker     Packs/day: 0.50     Years: 30.00     Types: Cigarettes   ??? Smokeless tobacco: Never Used   ??? Alcohol use 1.0 oz/week     2 Standard drinks or equivalent per week      Comment: socially    ??? Drug use: Yes     Special: Marijuana      Comment: last used 2 weeks ago   ??? Sexual activity: Not Currently     Other Topics Concern   ??? Not on file     Social History Narrative         ALLERGIES: Niaspan [niacin]; Aspirin; and Ibuprofen    Review of Systems   Constitutional: Negative for chills and fever.   Respiratory: Negative for chest tightness and shortness of breath.    Gastrointestinal: Negative for abdominal pain, nausea and vomiting.   Musculoskeletal: Negative for back pain and neck pain.   Skin: Positive for rash.   Neurological: Positive for headaches. Negative for dizziness, light-headedness and numbness.   Psychiatric/Behavioral: Negative for depression.  All other systems reviewed and are negative.      Vitals:    03/25/16 1630 03/25/16 1700 03/25/16 1739   BP: (!) 150/99 162/89 159/82   Pulse: (!) 135 (!) 114 (!) 108   Resp: 20 20 19    Temp: 98.2 ??F (36.8 ??C)     SpO2: 98% 98%    Weight: 94.3 kg (208 lb)     Height: 5\' 2"  (1.575 m)              Physical Exam   Constitutional: She is oriented to person, place, and time. She appears well-developed and well-nourished. She appears distressed.   HENT:   Head: Normocephalic and atraumatic.   Eyes: Conjunctivae and EOM are normal. Pupils are equal, round, and reactive to light.   Neck: Normal range of motion.   Cardiovascular: Normal rate, regular rhythm, normal heart sounds and intact distal pulses.    No murmur heard.  Pulmonary/Chest: Effort normal and breath sounds normal. No stridor. No respiratory distress. She has no wheezes.   Abdominal: Soft. Bowel sounds are normal. There is no tenderness.   Musculoskeletal: Normal range of motion. She exhibits no edema or tenderness.   Neurological: She is alert and oriented to person, place, and time. No cranial nerve deficit.   Skin: Skin is warm and dry. Rash noted. She is not diaphoretic.   Diffuse redness to arms, neck, trunk   Psychiatric: She has a normal mood and affect.    Nursing note and vitals reviewed.       MDM  Number of Diagnoses or Management Options  Allergic reaction, initial encounter:   Diagnosis management comments: Acute allergic reaction to likely shrimp - treat with meds in the ED and d/c with RX for symptoms and refer to her PCP for further care and allergy testing.    Patient Progress  Patient progress: stable    ED Course       Procedures

## 2016-03-25 NOTE — ED Notes (Signed)
Patient medicated for allergic reaction as ordered.  Patient tolerated medication well.  Vital signs updated.  Patient refuses to leave blood pressure cuff on at this time.  Call bell in reach.  Patient continues with itching and nasal congestion.  No difficulty breathing noted.  Will continue to monitor.

## 2016-03-25 NOTE — ED Notes (Signed)
Patient ambulatory to restroom as requested.  Patient's gait is steady.

## 2016-03-25 NOTE — ED Triage Notes (Signed)
Patient arrives to treatment area via EMS.  Patient states she began with hives and itching about 45mins PTA.  Patient states she took 50mg  of benadryl with itching began.  Patient felt no relief, so she took another 25mg  benadryl about twenty minutes after first administration.  Patient denies difficulty breathing.  Patient has epi-pen at home, but did not take the epi-pen.

## 2016-03-25 NOTE — ED Notes (Signed)
The patient was discharged home by Dr. Bartholomew in stable condition. The patient is alert and oriented, in no respiratory distress and discharge vital signs obtained. The patient's diagnosis, condition and treatment were explained. The patient expressed understanding. Three prescriptions given. No work/school note given. A discharge plan has been developed. A case manager was not involved in the process. Aftercare instructions were given.  Pt ambulatory out of the ED.

## 2016-03-26 ENCOUNTER — Encounter: Attending: Family Medicine | Primary: Family Medicine

## 2016-03-30 ENCOUNTER — Ambulatory Visit: Admit: 2016-03-30 | Payer: MEDICARE | Attending: Family | Primary: Family Medicine

## 2016-03-30 DIAGNOSIS — F419 Anxiety disorder, unspecified: Secondary | ICD-10-CM

## 2016-03-30 MED ORDER — ALPRAZOLAM 1 MG TAB
1 mg | ORAL_TABLET | Freq: Two times a day (BID) | ORAL | 2 refills | Status: DC | PRN
Start: 2016-03-30 — End: 2016-06-22

## 2016-03-30 NOTE — Progress Notes (Signed)
Identified pt with two pt identifiers(name and DOB).    Chief Complaint   Patient presents with   ??? Physical   ??? Medication Refill   ??? Allergic Reaction     ED 03/24/16  ate string beans and shrimp - allergic reaction - needs referral to allergist        Health Maintenance Due   Topic   ??? EYE EXAM RETINAL OR DILATED Q1    ??? FOBT Q 1 YEAR AGE 52-75    ??? PAP AKA CERVICAL CYTOLOGY    Dr Leary RocaMahoney for pap and [facility]    Wt Readings from Last 3 Encounters:   03/30/16 213 lb 6.4 oz (96.8 kg)   03/25/16 208 lb (94.3 kg)   01/20/16 218 lb 11.2 oz (99.2 kg)     Temp Readings from Last 3 Encounters:   03/30/16 98.2 ??F (36.8 ??C) (Oral)   03/25/16 98.2 ??F (36.8 ??C)   01/20/16 98.6 ??F (37 ??C) (Oral)     BP Readings from Last 3 Encounters:   03/30/16 131/83   03/25/16 159/82   01/20/16 148/90     Pulse Readings from Last 3 Encounters:   03/30/16 (!) 102   03/25/16 (!) 108   01/20/16 (!) 102         Learning Assessment:  :     Learning Assessment 04/26/2013 11/25/2012   PRIMARY LEARNER Patient Patient   HIGHEST LEVEL OF EDUCATION - PRIMARY LEARNER  SOME COLLEGE 2 YEARS OF COLLEGE   BARRIERS PRIMARY LEARNER NONE NONE   CO-LEARNER CAREGIVER No No   PRIMARY LANGUAGE ENGLISH ENGLISH   INTERPRETER NEED - No   LEARNER PREFERENCE PRIMARY LISTENING LISTENING   LEARNING SPECIAL TOPICS - no   ANSWERED BY self patient   RELATIONSHIP SELF SELF       Depression Screening:  :     PHQ 2 / 9, over the last two weeks 03/30/2016   Little interest or pleasure in doing things Not at all   Feeling down, depressed or hopeless Several days   Total Score PHQ 2 1       Fall Risk Assessment:  :     Fall Risk Assessment, last 12 mths 03/30/2016   Able to walk? Yes   Fall in past 12 months? No       Abuse Screening:  :     Abuse Screening Questionnaire 03/30/2016 04/26/2013   Do you ever feel afraid of your partner? N N   Are you in a relationship with someone who physically or mentally threatens you? N N   Is it safe for you to go home? Y Y        Coordination of Care Questionnaire:  :     1) Have you been to an emergency room, urgent care clinic since your last visit? yes Mary Mayo 03/24/16  Hospitalized since your last visit? no             2) Have you seen or consulted any other health care providers outside of [facility] since your last visit? yes, no  (Include any pap smears or colon screenings in this section.)    3) Do you have an Advance Directive on file? No she has her own   Are you interested in receiving information about Advance Directives? no    Patient is accompanied by grandson I have received verbal consent from Mary Mayo to discuss any/all medical information while they are present  in the room.    Reviewed record in preparation for visit and have obtained necessary documentation.  Medication reconciliation up to date and corrected with patient at this time.

## 2016-03-30 NOTE — Progress Notes (Signed)
HISTORY OF PRESENT ILLNESS  Mary Mayo is a 50 y.o. female.  HPI  Pt presents for "medication refill"    Pt states that she is here for 2 things.  She needs a refill of her Xanax at this time, as well as she needs to be seen by an allergist due to allergic reaction.    Pt states that she takes the xanax about twice a day, and it seems to be working well for her anxiety.  Pt has been stable on this medication for some time now, and denies any negative side effects.    Pt states that on Wednesday, 4/5, she ate some shrimp and string beans, like she does quite often.  Pt states that she prepared her food like she usually does, just with some salt, pepper, and garlic powder.  Pt states that she noted soon after eating her food, that her face was swelling.  Soon after, she had hives all over.  Pt took 2 benadryl, without any relief.  Pt went to ER, and reaction was controlled.  Pt states that she is interested in seeing an allergist, as this has occurred multiple times in the past year.  Review of Systems   Constitutional: Negative for fever.   HENT: Negative for congestion.    Respiratory: Negative for cough.    Gastrointestinal: Negative for diarrhea and vomiting.       Physical Exam   Constitutional: She is oriented to person, place, and time. She appears well-developed and well-nourished.   HENT:   Head: Normocephalic and atraumatic.   Neck: Normal range of motion. Neck supple.   Cardiovascular: Normal rate, regular rhythm and normal heart sounds.    Pulmonary/Chest: Effort normal and breath sounds normal.   Lymphadenopathy:     She has no cervical adenopathy.   Neurological: She is alert and oriented to person, place, and time.   Skin: Skin is warm and dry.   Psychiatric: She has a normal mood and affect. Her behavior is normal.       ASSESSMENT and PLAN    ICD-10-CM ICD-9-CM    1. Anxiety F41.9 300.00 ALPRAZolam (XANAX) 1 mg tablet   2. Allergic reaction to food T78.1XXA 995.7 REFERRAL TO ALLERGY      Medication refilled.  Educated about taking as prescribed, as needed.  Educated about risks of this medication, such as addiction.    Educated about Printmak[employer]alling allergist today, for appointment ASAP.  Educated about ALWAYS calling 911 with any allergic reaction involving swelling, difficulty breathing, etc.    Pt informed to return to office with worsening of symptoms, or PRN with any questions or concerns.  Pt verbalizes understanding of plan of care and denies further questions or concerns at this time.

## 2016-03-30 NOTE — Patient Instructions (Addendum)
Allergic Reaction: Care Instructions  Your Care Instructions  An allergic reaction is an excessive response from your immune system to a medicine, chemical, food, insect bite, or other substance. A reaction can range from mild to life-threatening. Some people have a mild rash, hives, and itching or stomach cramps. In severe reactions, swelling of your tongue and throat can close up your airway so that you cannot breathe.  Follow-up care is a key part of your treatment and safety. Be sure to make and go to all appointments, and call your doctor if you are having problems. It's also a good idea to know your test results and keep a list of the medicines you take.  How can you care for yourself at home?  ?? If you know what caused your allergic reaction, be sure to avoid it. Your allergy may become more severe each time you have a reaction.  ?? Take an over-the-counter antihistamine, such as cetirizine (Zyrtec) or loratadine (Claritin), to treat mild symptoms. Read and follow directions on the label. Some antihistamines can make you feel sleepy. Do not give antihistamines to a child unless you have checked with your doctor first. Mild symptoms include sneezing or an itchy or runny nose; an itchy mouth; a few hives or mild itching; and mild nausea or stomach discomfort.  ?? Do not scratch hives or a rash. Put a cold, moist towel on them or take cool baths to relieve itching. Put ice packs on hives, swelling, or insect stings for 10 to 15 minutes at a time. Put a thin cloth between the ice pack and your skin. Do not take hot baths or showers. They will make the itching worse.  ?? Your doctor may prescribe a shot of epinephrine to carry with you in case you have a severe reaction. Learn how to give yourself the shot and keep it with you at all times. Make sure it is not expired.  ?? Go to the emergency room every time you have a severe reaction, even if  you have used your shot of epinephrine and are feeling better. Symptoms can come back after a shot.  ?? Wear medical alert jewelry that lists your allergies. You can buy this at most drugstores.  ?? If your child has a severe allergy, make sure that his or her teachers, babysitters, coaches, and other caregivers know about the allergy. They should have an epinephrine shot, know how and when to give it, and have a plan to take your child to the hospital.  When should you call for help?  Give an epinephrine shot if:  ?? You think you are having a severe allergic reaction.  ?? You have symptoms in more than one body area, such as mild nausea and an itchy mouth.  After giving an epinephrine shot call 911, even if you feel better.  Call 911 if:  ?? You have symptoms of a severe allergic reaction. These may include:  ?? Sudden raised, red areas (hives) all over your body.  ?? Swelling of the throat, mouth, lips, or tongue.  ?? Trouble breathing.  ?? Passing out (losing consciousness). Or you may feel very lightheaded or suddenly feel weak, confused, or restless.  ?? You have been given an epinephrine shot, even if you feel better.  Call your doctor now or seek immediate medical care if:  ?? You have symptoms of an allergic reaction, such as:  ?? A rash or hives (raised, red areas on the skin).  ?? Itching.  ??   Swelling.  ?? Belly pain, nausea, or vomiting.  Watch closely for changes in your health, and be sure to contact your doctor if:  ?? You do not get better as expected.  Where can you learn more?  Go to http://www.healthwise.net/GoodHelp[employer]ctions.  Enter E081 in the search box to learn more about "Allergic Reaction: Care Instructions."  Current as of: February 01, 2015  Content Version: 11.2  ?? 2006-2017 [employer]. Care instructions adapted under license by [employer] (which disclaims liability or warranty for this information). If you have questions about a medical condition or  this instruction, always ask your healthcare professional. [employer]wise, Incorporated disclaims any warranty or liability for your use of this information.

## 2016-04-08 ENCOUNTER — Encounter

## 2016-04-15 MED ORDER — LOSARTAN 25 MG TAB
25 mg | ORAL_TABLET | ORAL | 5 refills | Status: AC
Start: 2016-04-15 — End: ?

## 2016-04-20 NOTE — Telephone Encounter (Signed)
-----   [facility] sent at 04/20/2016  1:29 PM EDT -----  Regarding: Dr. Teressa LowerHennessey/Telephone - URGENT  Pt is requesting a call back. Pt want to discuss her test results. Pt also stated she's having headaches everyday. Pt contact number [phone].

## 2016-04-21 ENCOUNTER — Encounter

## 2016-04-21 MED ORDER — OMEPRAZOLE 40 MG CAP, DELAYED RELEASE
40 mg | ORAL_CAPSULE | ORAL | 2 refills | Status: AC
Start: 2016-04-21 — End: ?

## 2016-04-21 NOTE — Telephone Encounter (Signed)
Patient called requesting refill.

## 2016-04-21 NOTE — Telephone Encounter (Signed)
LOV 03/30/16  Labs 01/17/16  Rx given 10/04/15 with 2 refills.   Pepcid given by another practice on 03/25/16 for 20 tabs.

## 2016-05-19 ENCOUNTER — Encounter

## 2016-05-19 MED ORDER — CLONIDINE 0.2 MG TAB
0.2 mg | ORAL_TABLET | Freq: Two times a day (BID) | ORAL | 5 refills | Status: AC
Start: 2016-05-19 — End: ?

## 2016-05-27 ENCOUNTER — Ambulatory Visit: Admit: 2016-05-27 | Discharge: 2016-05-27 | Payer: MEDICARE | Attending: Family Medicine | Primary: Family Medicine

## 2016-05-27 DIAGNOSIS — Z91018 Allergy to other foods: Secondary | ICD-10-CM

## 2016-05-27 MED ORDER — DIPHENHYDRAMINE 25 MG CAP
25 mg | ORAL_CAPSULE | ORAL | 5 refills | Status: AC | PRN
Start: 2016-05-27 — End: ?

## 2016-05-27 MED ORDER — METRONIDAZOLE 500 MG TAB
500 mg | ORAL_TABLET | Freq: Three times a day (TID) | ORAL | 0 refills | Status: AC
Start: 2016-05-27 — End: 2016-06-10

## 2016-05-27 MED ORDER — TETRACYCLINE 500 MG CAP
500 mg | ORAL_CAPSULE | Freq: Four times a day (QID) | ORAL | 0 refills | Status: AC
Start: 2016-05-27 — End: 2016-06-10

## 2016-05-27 MED ORDER — TIZANIDINE 2 MG TAB
2 mg | ORAL_TABLET | Freq: Four times a day (QID) | ORAL | 0 refills | Status: AC | PRN
Start: 2016-05-27 — End: ?

## 2016-05-27 MED ORDER — BISMUTH SUBSALICYLATE 262 MG CHEWABLE TAB
262 mg | ORAL_TABLET | Freq: Four times a day (QID) | ORAL | 0 refills | Status: AC
Start: 2016-05-27 — End: 2016-06-10

## 2016-05-27 MED ORDER — EPINEPHRINE 0.3 MG/0.3 ML IM PEN INJECTOR
0.3 mg/ mL | INJECTION | Freq: Once | INTRAMUSCULAR | 0 refills | Status: AC | PRN
Start: 2016-05-27 — End: ?

## 2016-05-27 NOTE — Progress Notes (Signed)
Chief Complaint   Patient presents with   ??? Labs     wants to discuss her recent lab results   ??? Request For New Medication     states she needs a RX for Benadryl to take everytime she eats or drinks anything.  states she gets hives     "REVIEWED RECORD IN PREPARATION FOR VISIT AND HAVE OBTAINED THE NECESSARY DOCUMENTATION"  1. Have you been to the ER, urgent care clinic since your last visit?  Hospitalized since your last visit?No    2. Have you seen or consulted any other health care providers [facility] since your last visit?  Include any pap smears or colon screening. Yes Reason for visit: sees Dr. Allena KatzPatel  Patient does not have advanced directives.

## 2016-05-27 NOTE — Progress Notes (Signed)
Subjective:      Mary Mayo is a 50 y.o. female here today to discuss most recent allergy testing results. Seen by Dr. Allena Katz and has food sensitivities to peanut, tree nuts, an cucumber. She has also has multiple environmental allergies. Advised to continue with Benadryl and to have Epipen on hand. She also had an elevated H pylori antibody and is concerned that she has an infection. She has a history of GERD for which she is currently on Prilosec and Pepcid.     Also with continue spasm in the neck for which her current muscle relaxer therapy has not been effective.     Current Outpatient Prescriptions   Medication Sig Dispense Refill   ??? diphenhydrAMINE (ALLERGY MEDICINE) 25 mg capsule Take 2 Caps by mouth every four (4) hours as needed. Indications: allergic reaction, URTICARIA 60 Cap 5   ??? EPINEPHrine (EPIPEN 2-PAK) 0.3 mg/0.3 mL injection 0.3 mL by IntraMUSCular route once as needed for up to 1 dose. 2 Syringe 0   ??? tiZANidine (ZANAFLEX) 2 mg tablet Take 1 Tab by mouth every six (6) hours as needed. Indications: MUSCLE SPASM 30 Tab 0   ??? cloNIDine HCl (CATAPRES) 0.2 mg tablet Take 1.5 Tabs by mouth two (2) times a day. 90 Tab 5   ??? omeprazole (PRILOSEC) 40 mg capsule TAKE 1 CAP BY MOUTH TWO (2) TIMES A DAY. 60 Cap 2   ??? losartan (COZAAR) 25 mg tablet TAKE 1 TAB BY MOUTH DAILY. 30 Tab 5   ??? ALPRAZolam (XANAX) 1 mg tablet Take 1 Tab by mouth two (2) times daily as needed for Anxiety. Max Daily Amount: 2 mg. 60 Tab 2   ??? famotidine (PEPCID) 20 mg tablet Take 1 Tab by mouth two (2) times a day. 20 Tab 0   ??? metFORMIN ER (GLUCOPHAGE XR) 500 mg tablet Take 2 Tabs by mouth two (2) times daily (after meals). 120 Tab 11   ??? traZODone (DESYREL) 100 mg tablet Take 1 Tab by mouth nightly. 30 Tab 3   ??? acetaminophen (TYLENOL EXTRA STRENGTH) 500 mg tablet Take  by mouth every six (6) hours as needed for Pain.     ??? topiramate (TOPAMAX) 25 mg tablet 1 po qhs 30 Tab 6    ??? fluticasone (FLONASE) 50 mcg/actuation nasal spray 2 Sprays by Both Nostrils route daily. Indications: CHRONIC NON-ALLERGIC RHINITIS 1 Bottle 0   ??? diclofenac (VOLTAREN) 1 % gel Apply 2 g to affected area four (4) times daily. 100 g 0   ??? atorvastatin (LIPITOR) 10 mg tablet Take 1 Tab by mouth daily. 90 Tab 1       Allergies   Allergen Reactions   ??? Niaspan [Niacin] Other (comments)     Facial flushing and felt hot   ??? Aspirin Hives and Nausea and Vomiting   ??? Ibuprofen Swelling     Hand and feet swelling       Past Medical History:   Diagnosis Date   ??? Arthritis     spinal stenosis    ??? Asthma     last uses inhaler 2004   ??? Bipolar Affective, Depress, Unspec 07/11/2008   ??? Cyst     excision of a chronic abscess of left axilla   ??? Diabetes (HCC)     type 2   ??? Dysthymic disorder 08/06/2010   ??? GERD (gastroesophageal reflux disease)    ??? Headache    ??? Hypercholesterolemia    ??? Hypertension    ???  Ill-defined condition     vitamin d deficiency   ??? Ill-defined condition     season allergies   ??? Ill-defined condition     high cholesterol   ??? Psychotic disorder     bipolar   ??? Unspecified sleep apnea     last used 5 years ago- discontinued due to weight loss       Social History   Substance Use Topics   ??? Smoking status: Current Every Day Smoker     Packs/day: 0.50     Years: 30.00     Types: Cigarettes   ??? Smokeless tobacco: Never Used   ??? Alcohol use 1.0 oz/week     2 Standard drinks or equivalent per week      Comment: socially        Review of Systems  Pertinent items are noted in HPI.     Objective:     Visit Vitals   ??? BP 140/90 (BP 1 Location: Left arm, BP Patient Position: Sitting)   ??? Pulse 99   ??? Temp 97.7 ??F (36.5 ??C) (Oral)   ??? Resp 18   ??? Ht 5\' 2"  (1.575 m)   ??? Wt 219 lb (99.3 kg)   ??? SpO2 98%   ??? BMI 40.06 kg/m2      General appearance - alert, well appearing, and in no distress  Eyes - pupils equal and reactive, extraocular eye movements intact, sclera anicteric   Chest - clear to auscultation, no wheezes, rales or rhonchi, symmetric air entry, no tachypnea, retractions or cyanosis  Heart - normal rate, regular rhythm, normal S1, S2, no murmurs, rubs, clicks or gallops  Neurological - alert, oriented, normal speech, no focal findings or movement disorder noted    Assessment/Plan:   Mary Mayo is a 50 y.o. female seen for:     1. Multiple food allergies: continue with Benadryl therapy. Epipen renewed.   - diphenhydrAMINE (ALLERGY MEDICINE) 25 mg capsule; Take 2 Caps by mouth every four (4) hours as needed. Indications: allergic reaction, URTICARIA  Dispense: 60 Cap; Refill: 5  - EPINEPHrine (EPIPEN 2-PAK) 0.3 mg/0.3 mL injection; 0.3 mL by IntraMUSCular route once as needed for up to 1 dose.  Dispense: 2 Syringe; Refill: 0    2. Positive H. pylori titer: will treat as below. Advised to continue with omeprazole therapy.   - tetracycline (ACHROMYCIN, SUMYCIN) 500 mg capsule; Take 1 Cap by mouth four (4) times daily for 14 days.  Dispense: 56 Cap; Refill: 0  - metroNIDAZOLE (FLAGYL) 500 mg tablet; Take 1 Tab by mouth three (3) times daily for 14 days.  Dispense: 42 Tab; Refill: 0  - bismuth subsalicylate (PEPTO-BISMOL) 262 mg chew; Take 2 Tabs by mouth four (4) times daily for 14 days.  Dispense: 112 Tab; Refill: 0    3. Abnormal CBC measurement  - CBC WITH AUTOMATED DIFF    4. Muscle spasm  - tiZANidine (ZANAFLEX) 2 mg tablet; Take 1 Tab by mouth every six (6) hours as needed. Indications: MUSCLE SPASM  Dispense: 30 Tab; Refill: 0    I have discussed the diagnosis with the patient and the intended plan as seen in the above orders. The patient has received an after-visit summary and questions were answered concerning future plans. I have discussed medication side effects and warnings with the patient as well. Patient verbalizes understanding of plan of care and denies further questions or concerns at this time. Informed patient to return to the office  if  symptoms worsen or if new symptoms arise.    Follow-up Disposition:  Return if symptoms worsen or fail to improve.

## 2016-05-27 NOTE — Patient Instructions (Signed)
H. Pylori Bacterial Infection: Care Instructions  Your Care Instructions    Your test shows the presence of Helicobacter pylori (H. pylori), a kind of bacterium that lives in the lining of the stomach. Many people have H. pylori in their stomachs and do not develop problems. But sometimes H. pylori causes an upset stomach or a sore (ulcer) in the stomach lining. Most stomach ulcers are caused by H. pylori. Symptoms of an ulcer include gnawing or burning pain in the belly that can last minutes or hours. Eating food or taking antacids helps relieve the pain, but the symptoms may come back after a while. Antibiotic medicine can cure an H. pylori infection.  Follow-up care is a key part of your treatment and safety. Be sure to make and go to all appointments, and call your doctor if you are having problems. It???s also a good idea to know your test results and keep a list of the medicines you take.  How can you care for yourself at home?  ?? Take your antibiotics as directed. Do not stop taking them just because you feel better. You need to take the full course of antibiotics.  ?? If your doctor prescribes other medicine, take it exactly as prescribed. Call your doctor if you think you are having a problem with your medicine. You will get more details on the specific medicine your doctor prescribes.  ?? Eat a healthy, balanced diet.  ?? Eat smaller meals, and eat more often. Be sure to eat at least three meals a day.  ?? Avoid heavily spiced or greasy foods.  ?? Do not drink beverages that have caffeine if they bother your stomach. These include coffee, tea, and soda.  ?? Do not smoke. Smoking slows the healing of your ulcer and can make an ulcer come back. If you need help quitting, talk to your doctor about stop-smoking programs and medicines. These can increase your chances of quitting for good.  ?? Limit how much alcohol you drink. Alcohol can slow healing of an ulcer and can make your symptoms worse.   ?? Wash your hands after going to the bathroom.  ?? Avoid aspirin, ibuprofen, or other anti-inflammatory medicines, because they can irritate the stomach. If you need pain medicine, try acetaminophen (Tylenol).  When should you call for help?  Call 911 anytime you think you may need emergency care. For example, call if:  ?? You passed out (lost consciousness).  ?? You vomit blood or what looks like coffee grounds.  ?? You pass maroon or very bloody stools.  Call your doctor now or seek immediate medical care if:  ?? You have severe pain in your belly, back, or shoulders.  ?? You have new or worsening belly pain.  ?? You are dizzy or lightheaded, or you feel like you may faint.  ?? Your stools are black and tarlike or have streaks of blood.  Watch closely for changes in your health, and be sure to contact your doctor if:  ?? You have new symptoms such as weight loss, nausea, or vomiting.  ?? You do not feel better as expected.  Where can you learn more?  Go to http://www.healthwise.net/GoodHelpConnections.  Enter W698 in the search box to learn more about "H. Pylori Bacterial Infection: Care Instructions."  Current as of: July 30, 2015  Content Version: 11.2  ?? 2006-2017 [employer]. Care instructions adapted under license by [employer] (which disclaims liability or warranty for this information). If you have questions   about a medical condition or this instruction, always ask your healthcare professional. [employer] disclaims any warranty or liability for your use of this information.

## 2016-05-28 LAB — CBC WITH AUTOMATED DIFF
ABS. BASOPHILS: 0 10*3/uL (ref 0.0–0.2)
ABS. EOSINOPHILS: 0.1 10*3/uL (ref 0.0–0.4)
ABS. IMM. GRANS.: 0 10*3/uL (ref 0.0–0.1)
ABS. MONOCYTES: 0.5 10*3/uL (ref 0.1–0.9)
ABS. NEUTROPHILS: 2.5 10*3/uL (ref 1.4–7.0)
Abs Lymphocytes: 3.7 10*3/uL — ABNORMAL HIGH (ref 0.7–3.1)
BASOPHILS: 0 %
EOSINOPHILS: 1 %
HCT: 38.1 % (ref 34.0–46.6)
HGB: 11.9 g/dL (ref 11.1–15.9)
IMMATURE GRANULOCYTES: 0 %
Lymphocytes: 55 %
MCH: 28.6 pg (ref 26.6–33.0)
MCHC: 31.2 g/dL — ABNORMAL LOW (ref 31.5–35.7)
MCV: 92 fL (ref 79–97)
MONOCYTES: 7 %
NEUTROPHILS: 37 %
PLATELET: 205 10*3/uL (ref 150–379)
RBC: 4.16 x10E6/uL (ref 3.77–5.28)
RDW: 14 % (ref 12.3–15.4)
WBC: 6.8 10*3/uL (ref 3.4–10.8)

## 2016-05-29 NOTE — Telephone Encounter (Signed)
Chart reflects that requested medication had been prescribed in April with 2 refills and such she should have one refill remaining. [facility] [address] and informed that remaining refill had been transferred to [facility] ([address]). Spoke with Pharmacy Technician at this location and informed that prescription has been filled and was picked up at 4:26 PM today.

## 2016-05-29 NOTE — Telephone Encounter (Signed)
Patient is calling back regarding previous message. I advised patient that the physicians are in clinic right at this moment and I would send another message. Patient was persistant that she hear something back today.

## 2016-05-29 NOTE — Telephone Encounter (Signed)
Patient called stating that she is out of her Xanax and is in need of a refill. Patient states she is going out of town at 4:00PM and is not feeling right without her meds.     [facility] [address]

## 2016-05-29 NOTE — Telephone Encounter (Signed)
LOV 05/27/16 with Dr. Dayton ScrapeMurray

## 2016-06-12 NOTE — Telephone Encounter (Signed)
Pt would like a call back from Dr Dayton ScrapeMurray about a medication pt is on for a condition and not really feeling herself.

## 2016-06-18 NOTE — Telephone Encounter (Signed)
Pt called last week and need a call back asap

## 2016-06-22 ENCOUNTER — Encounter

## 2016-06-22 MED ORDER — ALPRAZOLAM 1 MG TAB
1 mg | ORAL_TABLET | Freq: Two times a day (BID) | ORAL | 2 refills | Status: DC | PRN
Start: 2016-06-22 — End: 2016-08-21

## 2016-06-22 NOTE — Telephone Encounter (Signed)
Do you want her to have Ativan or Xanax called in?

## 2016-06-22 NOTE — Telephone Encounter (Signed)
Patient's call returned. Advised of medication refill policy of 24-48 business hours.     Will call in Ativan to her pharamacy.     She also report that one of her new medications is making her "feel funny". Does not state name of medication, but newest medication prescribed was Zanaflex. Effects of medication discussed including drowsiness which she is experiencing. May discontinue medication if unable to tolerate these effects.

## 2016-06-22 NOTE — Telephone Encounter (Signed)
Xanax please -- sorry for the confusion.

## 2016-06-22 NOTE — Telephone Encounter (Signed)
Patient called stating that she is at her pharmacy right now and was stating that she needs refills on her Xanax. I explained to patient that I will put a refill request back for this medication but it typically takes 24-48 hrs for providers to respond. Patient states she is going out of town tomorrow and needs it now. Patient was very demanding. Patient then states, "Also, two messages were put back last week for Dr Dayton ScrapeMurray to call me and nobody has called, I am very upset and this is not right." Patient hung up on me.     Best contact: [phone]

## 2016-06-22 NOTE — Telephone Encounter (Signed)
Xanax Rx called to [facility] [address] [place]. As requested by Dr. Dayton ScrapeMurray..Marland Kitchen

## 2016-06-26 ENCOUNTER — Encounter: Attending: Family Medicine | Primary: Family Medicine

## 2016-07-16 ENCOUNTER — Encounter

## 2016-07-16 MED ORDER — ATORVASTATIN 10 MG TAB
10 mg | ORAL_TABLET | ORAL | 1 refills | Status: DC
Start: 2016-07-16 — End: 2016-08-21

## 2016-08-04 ENCOUNTER — Encounter: Attending: Family Medicine | Primary: Family Medicine

## 2016-08-07 ENCOUNTER — Encounter: Attending: Family Medicine | Primary: Family Medicine

## 2016-08-19 ENCOUNTER — Ambulatory Visit: Admit: 2016-08-19 | Discharge: 2016-08-19 | Payer: MEDICARE | Attending: Family Medicine | Primary: Family Medicine

## 2016-08-19 DIAGNOSIS — Z0000 Encounter for general adult medical examination without abnormal findings: Secondary | ICD-10-CM

## 2016-08-19 MED ORDER — FLUCONAZOLE 150 MG TAB
150 mg | ORAL_TABLET | ORAL | 0 refills | Status: AC
Start: 2016-08-19 — End: ?

## 2016-08-19 NOTE — Patient Instructions (Addendum)
Medicare Wellness Visit, Female    The best way to live healthy is to have a healthy lifestyle by eating a well-balanced diet, exercising regularly, limiting alcohol and stopping smoking.    Regular physical exams and screening tests are another way to keep healthy. Preventive exams provided by your health care provider can find health problems before they become diseases or illnesses. Preventive services including immunizations, screening tests, monitoring and exams can help you take care of your own health.    All people over age 34 should have a pneumovax  and and a prevnar shot to prevent pneumonia. These are once in a lifetime unless you and your provider decide differently.    All people over 65 should have a yearly flu shot and a tetanus vaccine every 10 years.    A bone mass density to screen for osteoporosis or thinning of the bones should be done every 2 years after 65.    Screening for diabetes mellitus with a blood sugar test should be done every year.    Glaucoma is a disease of the eye due to increased ocular pressure that can lead to blindness and it should be done every year by an eye professional.    Cardiovascular screening tests that check for elevated lipids (fatty part of blood) which can lead to heart disease and strokes should be done every 5 years.    Colorectal screening that evaluates for blood or polyps in your colon should be done yearly as a stool test or every five years as a flexible sigmoidoscope or every 10 years as a colonoscopy up to age 109.    Breast cancer screening with a mammogram is recommended biennially  for women age 69-74.    Screening for cervical cancer with a pap smear and pelvic exam is recommended for women after age 4 years every 2 years up to age 28 or when the provider and patient decide to stop.If there is a history of cervical abnormalities or other increased risk for cancer then the test is recommended yearly.     Hepatitis C screening is also recommended for anyone born between [birth year] through 1965.    A shingles vaccine is also recommended once in a lifetime after age 21.    Your Medicare Wellness Exam is recommended annually.    Here is a list of your current Health Maintenance items with a due date:  Health Maintenance Due   Topic Date Due   ??? Annual Well Visit  02/17/1982   ??? Eye Exam  01/18/2013   ??? Stool testing for trace blood  02/17/2014   ??? Cervical Cancer Screening  04/30/2014   ??? Hemoglobin A1C    07/06/2016   ??? Flu Vaccine  07/21/2016            A Healthy Lifestyle: Care Instructions  Your Care Instructions  A healthy lifestyle can help you feel good, stay at a healthy weight, and have plenty of energy for both work and play. A healthy lifestyle is something you can share with your whole family.  A healthy lifestyle also can lower your risk for serious health problems, such as high blood pressure, heart disease, and diabetes.  You can follow a few steps listed below to improve your health and the health of your family.  Follow-up care is a key part of your treatment and safety. Be sure to make and go to all appointments, and call your doctor if you are having problems. It???s also a  good idea to know your test results and keep a list of the medicines you take.  How can you care for yourself at home?  ?? Do not eat too much sugar, fat, or fast foods. You can still have dessert and treats now and then. The goal is moderation.  ?? Start small to improve your eating habits. Pay attention to portion sizes, drink less juice and soda pop, and eat more fruits and vegetables.  ?? Eat a healthy amount of food. A 3-ounce serving of meat, for example, is about the size of a deck of cards. Fill the rest of your plate with vegetables and whole grains.  ?? Limit the amount of soda and sports drinks you have every day. Drink more water when you are thirsty.  ?? Eat at least 5 servings of fruits and vegetables every day. It may seem  like a lot, but it is not hard to reach this goal. A serving or helping is 1 piece of fruit, 1 cup of vegetables, or 2 cups of leafy, raw vegetables. Have an apple or some carrot sticks as an afternoon snack instead of a candy bar. Try to have fruits and/or vegetables at every meal.  ?? Make exercise part of your daily routine. You may want to start with simple activities, such as walking, bicycling, or slow swimming. Try to be active 30 to 60 minutes every day. You do not need to do all 30 to 60 minutes all at once. For example, you can exercise 3 times a day for 10 or 20 minutes. Moderate exercise is safe for most people, but it is always a good idea to talk to your doctor before starting an exercise program.  ?? Keep moving. Mow the lawn, work in the garden, or [employer] your house. Take the stairs instead of the elevator at work.  ?? If you smoke, quit. People who smoke have an increased risk for heart attack, stroke, cancer, and other lung illnesses. Quitting is hard, but there are ways to boost your chance of quitting tobacco for good.  ?? Use nicotine gum, patches, or lozenges.  ?? Ask your doctor about stop-smoking programs and medicines.  ?? Keep trying.  In addition to reducing your risk of diseases in the future, you will notice some benefits soon after you stop using tobacco. If you have shortness of breath or asthma symptoms, they will likely get better within a few weeks after you quit.  ?? Limit how much alcohol you drink. Moderate amounts of alcohol (up to 2 drinks a day for men, 1 drink a day for women) are okay. But drinking too much can lead to liver problems, high blood pressure, and other health problems.  Family health  If you have a family, there are many things you can do together to improve your health.  ?? Eat meals together as a family as often as possible.  ?? Eat healthy foods. This includes fruits, vegetables, lean meats and dairy, and whole grains.   ?? Include your family in your fitness plan. Most people think of activities such as jogging or tennis as the way to fitness, but there are many ways you and your family can be more active. Anything that makes you breathe hard and gets your heart pumping is exercise. Here are some tips:  ?? Walk to do errands or to take your child to school or the bus.  ?? Go for a family bike ride after dinner instead of watching TV.  Where can you learn more?  Go to [employer].  Enter [phone] in the search box to learn more about "A Healthy Lifestyle: Care Instructions."  Current as of: July 16, 2015  Content Version: 11.3  ?? 2006-2017 [employer]. Care instructions adapted under license by [employer] (which disclaims liability or warranty for this information). If you have questions about a medical condition or this instruction, always ask your healthcare professional. [employer] disclaims any warranty or liability for your use of this information.

## 2016-08-19 NOTE — Progress Notes (Signed)
This is an Initial Medicare Annual Wellness Exam (AWV) (Performed 12 months after IPPE or effective date of Medicare Part B enrollment, Once in a lifetime)    I have reviewed the patient's medical history in detail and updated the computerized patient record.     History     Past Medical History:   Diagnosis Date   ??? Arthritis     spinal stenosis    ??? Asthma     last uses inhaler 2004   ??? Bipolar Affective, Depress, Unspec 07/11/2008   ??? Cyst     excision of a chronic abscess of left axilla   ??? Diabetes (HCC)     type 2   ??? Dysthymic disorder 08/06/2010   ??? GERD (gastroesophageal reflux disease)    ??? Headache    ??? Hypercholesterolemia    ??? Hypertension    ??? Ill-defined condition     vitamin d deficiency   ??? Ill-defined condition     season allergies   ??? Ill-defined condition     high cholesterol   ??? Psychotic disorder     bipolar   ??? Unspecified sleep apnea     last used 5 years ago- discontinued due to weight loss        Past Surgical History:   Procedure Laterality Date   ??? HX GYN  2004    hysterectomy   ??? HX ORTHOPAEDIC  12-2012    left foot heel spur   ??? HX TONSILLECTOMY     ??? HX WISDOM TEETH EXTRACTION         Current Outpatient Prescriptions   Medication Sig Dispense Refill   ??? atorvastatin (LIPITOR) 10 mg tablet TAKE 1 TAB BY MOUTH DAILY. 90 Tab 1   ??? ALPRAZolam (XANAX) 1 mg tablet Take 1 Tab by mouth two (2) times daily as needed for Anxiety. Max Daily Amount: 2 mg. 60 Tab 2   ??? diphenhydrAMINE (ALLERGY MEDICINE) 25 mg capsule Take 2 Caps by mouth every four (4) hours as needed. Indications: allergic reaction, URTICARIA 60 Cap 5   ??? EPINEPHrine (EPIPEN 2-PAK) 0.3 mg/0.3 mL injection 0.3 mL by IntraMUSCular route once as needed for up to 1 dose. 2 Syringe 0   ??? tiZANidine (ZANAFLEX) 2 mg tablet Take 1 Tab by mouth every six (6) hours as needed. Indications: MUSCLE SPASM 30 Tab 0   ??? cloNIDine HCl (CATAPRES) 0.2 mg tablet Take 1.5 Tabs by mouth two (2) times a day. 90 Tab 5    ??? omeprazole (PRILOSEC) 40 mg capsule TAKE 1 CAP BY MOUTH TWO (2) TIMES A DAY. 60 Cap 2   ??? losartan (COZAAR) 25 mg tablet TAKE 1 TAB BY MOUTH DAILY. 30 Tab 5   ??? famotidine (PEPCID) 20 mg tablet Take 1 Tab by mouth two (2) times a day. 20 Tab 0   ??? metFORMIN ER (GLUCOPHAGE XR) 500 mg tablet Take 2 Tabs by mouth two (2) times daily (after meals). 120 Tab 11   ??? traZODone (DESYREL) 100 mg tablet Take 1 Tab by mouth nightly. 30 Tab 3   ??? acetaminophen (TYLENOL EXTRA STRENGTH) 500 mg tablet Take  by mouth every six (6) hours as needed for Pain.     ??? topiramate (TOPAMAX) 25 mg tablet 1 po qhs 30 Tab 6   ??? fluticasone (FLONASE) 50 mcg/actuation nasal spray 2 Sprays by Both Nostrils route daily. Indications: CHRONIC NON-ALLERGIC RHINITIS 1 Bottle 0   ??? diclofenac (VOLTAREN) 1 % gel Apply 2  g to affected area four (4) times daily. 100 g 0       Allergies   Allergen Reactions   ??? Niaspan [Niacin] Other (comments)     Facial flushing and felt hot   ??? Aspirin Hives and Nausea and Vomiting   ??? Cucumber Hives   ??? Ibuprofen Swelling     Hand and feet swelling   ??? Peanut Hives       Family History   Problem Relation Age of Onset   ??? Adopted: Yes   ??? Diabetes Mother    ??? Cancer Mother    ??? Hypertension Mother    ??? Hypertension Father        Social History   Substance Use Topics   ??? Smoking status: Current Every Day Smoker     Packs/day: 0.50     Years: 30.00     Types: Cigarettes   ??? Smokeless tobacco: Never Used   ??? Alcohol use 1.0 oz/week     2 Standard drinks or equivalent per week      Comment: socially       Patient Active Problem List   Diagnosis Code   ??? Esophageal reflux K21.9   ??? Lumbago M54.5   ??? Essential hypertension, benign I10   ??? Bipolar 1 disorder (HCC) F31.9   ??? Urge incontinence N39.41   ??? Type 2 diabetes mellitus without complication (HCC) E11.9   ??? Tachycardia, unspecified R00.0   ??? Unspecified sleep apnea G47.30   ??? Morbid obesity (HCC) E66.01   ??? Cyst    ??? Allergic rhinitis due to other allergen J30.89    ??? Migraine with aura, without mention of intractable migraine without mention of status migrainosus G43.109   ??? Mixed hyperlipidemia E78.2   ??? Dysthymic disorder F34.1   ??? Hidradenitis suppurativa L73.2   ??? Encounter for long-term (current) use of other medications Z79.899   ??? Microalbuminuria R80.9   ??? Lumbar spondylosis M47.816   ??? Unspecified vitamin D deficiency E55.9   ??? Tobacco use disorder F17.200   ??? Cervical stenosis of spine M48.02   ??? Spinal stenosis M48.00   ??? Frequent headaches R51   ??? Screening for colon cancer Z12.11   ??? Acute non-recurrent maxillary sinusitis J01.00   ??? Tachycardia R00.0   ??? Chronic urticaria L50.8       Depression Risk Factor Screening:     PHQ over the last two weeks 03/30/2016   Little interest or pleasure in doing things Not at all   Feeling down, depressed or hopeless Several days   Total Score PHQ 2 1       Alcohol Risk Factor Screening:   You do not drink alcohol or very rarely.    Functional Ability and Level of Safety:     Hearing Loss  Hearing is good.    Activities of Daily Living  The home contains: no safety equipment  Patient does total self care   ADL Assessment 05/27/2016   Feeding yourself No Help Needed   Getting from bed to chair No Help Needed   Getting dressed No Help Needed   Bathing or showering No Help Needed   Walk across the room (includes cane/walker) No Help Needed   Using the telphone No Help Needed   Taking your medications No Help Needed   Preparing meals No Help Needed   Managing money (expenses/bills) No Help Needed   Moderately strenuous housework (laundry) No Help Needed   Shopping for personal  items (toiletries/medicines) No Help Needed   Shopping for groceries No Help Needed   Driving No Help Needed   Climbing a flight of stairs No Help Needed   Getting to places beyond walking distances No Help Needed       Fall RiskFall Risk Assessment, last 12 mths 03/30/2016   Able to walk? Yes   Fall in past 12 months? No       Abuse Screen   Patient is not abused    Cognitive Screening   Evaluation of Cognitive Function:  Has your family/caregiver stated any concerns about your memory: no  Normal    Patient Care Team   Patient Care Team:  Dara Lords, MD as PCP - General (Family Practice)  Chesley Noon, MD (Neurology)  Assunta Gambles, MD as Physician (Obstetrics & Gynecology)     Assessment/Plan   Education and counseling provided:  Screening Mammography  Screening Pap and pelvic (covered once every 2 years)  Colorectal cancer screening tests    Health Maintenance Due   Topic Date Due   ??? MEDICARE YEARLY EXAM  02/17/1982   ??? EYE EXAM RETINAL OR DILATED Q1  01/18/2013   ??? FOBT Q 1 YEAR AGE 47-75  02/17/2014   ??? PAP AKA CERVICAL CYTOLOGY  04/30/2014   ??? HEMOGLOBIN A1C Q6M  07/06/2016   ??? INFLUENZA AGE 43 TO ADULT  07/21/2016

## 2016-08-19 NOTE — Progress Notes (Signed)
Subjective:     Mary Mayo is a 50 y.o. female who presents for follow up of diabetes and hypertension.    Hypertension:   - Diet and Lifestyle: generally follows a low fat low cholesterol diet, sedentary, smoker  - Home BP Monitoring: is not measured at home    Diabetes: occasionally will check BG at home "they haven't been high".   ?? HgA1c: 6.9 (01/07/16)  ?? Lipid panel: LDL 154 (01/07/16)  ?? Urine microalbumin: abnormal  ?? On ACE or ARB: Yes  ?? On statin: Yes  ?? On aspirin: no, allergic   ?? Diabetic foot exam: 09/2015   ?? Dilated eye exam: states she had had at [facility]  ?? Pneumococcal vaccine: has not had     Cardiovascular ROS: taking medications as instructed, no medication side effects noted, no TIA's, no chest pain on exertion, no dyspnea on exertion, no swelling of ankles, no orthostatic dizziness or lightheadedness, no orthopnea or paroxysmal nocturnal dyspnea.     New concerns:  - has noticed lesions on the lower legs that have come and gone. Not associated with pain or pruritis.   - will be moving to [place], [place] soon to be closer to family.     Current Outpatient Prescriptions   Medication Sig Dispense Refill   ??? atorvastatin (LIPITOR) 10 mg tablet TAKE 1 TAB BY MOUTH DAILY. 90 Tab 1   ??? ALPRAZolam (XANAX) 1 mg tablet Take 1 Tab by mouth two (2) times daily as needed for Anxiety. Max Daily Amount: 2 mg. 60 Tab 2   ??? diphenhydrAMINE (ALLERGY MEDICINE) 25 mg capsule Take 2 Caps by mouth every four (4) hours as needed. Indications: allergic reaction, URTICARIA 60 Cap 5   ??? EPINEPHrine (EPIPEN 2-PAK) 0.3 mg/0.3 mL injection 0.3 mL by IntraMUSCular route once as needed for up to 1 dose. 2 Syringe 0   ??? tiZANidine (ZANAFLEX) 2 mg tablet Take 1 Tab by mouth every six (6) hours as needed. Indications: MUSCLE SPASM 30 Tab 0   ??? cloNIDine HCl (CATAPRES) 0.2 mg tablet Take 1.5 Tabs by mouth two (2) times a day. 90 Tab 5   ??? omeprazole (PRILOSEC) 40 mg capsule TAKE 1 CAP BY MOUTH TWO (2) TIMES A  DAY. 60 Cap 2   ??? losartan (COZAAR) 25 mg tablet TAKE 1 TAB BY MOUTH DAILY. 30 Tab 5   ??? famotidine (PEPCID) 20 mg tablet Take 1 Tab by mouth two (2) times a day. 20 Tab 0   ??? metFORMIN ER (GLUCOPHAGE XR) 500 mg tablet Take 2 Tabs by mouth two (2) times daily (after meals). 120 Tab 11   ??? traZODone (DESYREL) 100 mg tablet Take 1 Tab by mouth nightly. 30 Tab 3   ??? acetaminophen (TYLENOL EXTRA STRENGTH) 500 mg tablet Take  by mouth every six (6) hours as needed for Pain.     ??? topiramate (TOPAMAX) 25 mg tablet 1 po qhs 30 Tab 6   ??? fluticasone (FLONASE) 50 mcg/actuation nasal spray 2 Sprays by Both Nostrils route daily. Indications: CHRONIC NON-ALLERGIC RHINITIS 1 Bottle 0   ??? diclofenac (VOLTAREN) 1 % gel Apply 2 g to affected area four (4) times daily. 100 g 0       Allergies   Allergen Reactions   ??? Niaspan [Niacin] Other (comments)     Facial flushing and felt hot   ??? Aspirin Hives and Nausea and Vomiting   ??? Cucumber Hives   ??? Ibuprofen Swelling  Hand and feet swelling   ??? Peanut Hives       Past Medical History:   Diagnosis Date   ??? Arthritis     spinal stenosis    ??? Asthma     last uses inhaler 2004   ??? Bipolar Affective, Depress, Unspec 07/11/2008   ??? Cyst     excision of a chronic abscess of left axilla   ??? Diabetes (HCC)     type 2   ??? Dysthymic disorder 08/06/2010   ??? GERD (gastroesophageal reflux disease)    ??? Headache    ??? Hypercholesterolemia    ??? Hypertension    ??? Ill-defined condition     vitamin d deficiency   ??? Ill-defined condition     season allergies   ??? Ill-defined condition     high cholesterol   ??? Psychotic disorder     bipolar   ??? Unspecified sleep apnea     last used 5 years ago- discontinued due to weight loss       Social History   Substance Use Topics   ??? Smoking status: Current Every Day Smoker     Packs/day: 0.50     Years: 30.00     Types: Cigarettes   ??? Smokeless tobacco: Never Used   ??? Alcohol use 1.0 oz/week     2 Standard drinks or equivalent per week      Comment: socially         Review of Systems, additional:  Pertinent items are noted in HPI.    Objective:     Vitals:    08/19/16 0804 08/19/16 0843   BP: (!) 154/112 144/86   Pulse: (!) 108    Resp: 18    Temp: 98.7 ??F (37.1 ??C)    TempSrc: Oral    SpO2: 98%    Weight: 215 lb (97.5 kg)    Height: 5\' 2"  (1.575 m)      General appearance - alert, well appearing, and in no distress  Mental status - alert, oriented to person, place, and time, normal mood, behavior, speech, dress, motor activity, and thought processes  Eyes - pupils equal and reactive, extraocular eye movements intact, sclera anicteric  Neck - supple, no significant adenopathy  Chest - clear to auscultation, no wheezes, rales or rhonchi, symmetric air entry, no tachypnea, retractions or cyanosis  Heart - normal rate, regular rhythm, normal S1, S2, no murmurs, rubs, clicks or gallops  Neurological - alert, oriented, normal speech, no focal findings or movement disorder noted  Extremities - peripheral pulses normal, no pedal edema, no clubbing or cyanosis  Skin - multiple fleshy lesions on the anterior leg bilaterally without tenderness     Lab review: orders written for new lab studies as appropriate; see orders.     Assessment/Plan:   Bary RichardSharon S Rosner is a 50 y.o. female seen today for:     1. Initial Medicare annual wellness visit    2. Screening for alcoholism  - Annual  Alcohol Screen 15 min ([id number])    3. Encounter for screening mammogram for malignant neoplasm of breast  - Bilateral Digital Screening Mammography; Future    4. ACP (advance care planning)    5. Type 2 diabetes mellitus with microalbuminuria, without long-term current use of insulin (HCC): due for A1c. Continue with current medications. Request last eye examination.   - HEMOGLOBIN A1C WITH EAG    6. Essential hypertension: repeat improved, no change in current therapy at this time.  7. Mixed hyperlipidemia: continue statin therapy, check labs  - LIPID PANEL     8. Skin lesions: will refer to Dermatology for further evaluation. ?lipomas.   - REFERRAL TO DERMATOLOGY    9. BMI 39.0-39.9,adult: Discussed the patient's above normal BMI with her.  I have recommended the following interventions: encourage exercise and lifestyle education regarding diet .  The BMI follow up plan is as follows: BMI is out of normal parameters and plan is as follows: I have counseled this patient on diet and exercise regimens (recommend at least 150 minutes per week of moderate-intensity exercise).     10. Yeast vaginitis  - fluconazole (DIFLUCAN) 150 mg tablet; Take 1 tablet by mouth. Repeat dose in 72 hours if still having symptoms.  Dispense: 2 Tab; Refill: 0    I have discussed the diagnosis with the patient and the intended plan as seen in the above orders. The patient has received an after-visit summary and questions were answered concerning future plans. I have discussed medication side effects and warnings with the patient as well. Patient verbalizes understanding of plan of care and denies further questions or concerns at this time. Informed patient to return to the office if symptoms worsen or if new symptoms arise.    Follow-up Disposition: Not on File

## 2016-08-19 NOTE — ACP (Advance Care Planning) (Signed)
Advance Care Planning (ACP) Provider Conversation Snapshot    Date of ACP Conversation: 08/19/16  Persons included in Conversation:  patient  Length of ACP Conversation in minutes:  <16 minutes (Non-Billable)    Authorized Management consultantDecision Maker (if patient is incapable of making informed decisions):   This person is:   Other Engineering geologistLegally Authorized Decision Maker (e.g. Next of Kin): Chemical engineerGeorge Ward (son)        For Patients with Decision Making Capacity:   Values/Goals: Exploration of values, goals, and preferences if recovery is not expected, even with continued medical treatment in the event of:  Imminent death  Severe, permanent brain injury  "In these circumstances, what matters most to you?"  Care focused more on comfort or quality of life.    Conversation Outcomes / Follow-Up Plan:   Recommended completion of Advance Directive form after review of ACP materials and conversation with prospective healthcare agent

## 2016-08-19 NOTE — ACP (Advance Care Planning) (Signed)
Advance Care Planning (ACP) Provider Conversation Snapshot    Date of ACP Conversation: 08/19/16  Persons included in Conversation:  patient  Length of ACP Conversation in minutes:  <16 minutes (Non-Billable)    Authorized Decision Maker (if patient is incapable of making informed decisions):   This person is:   Other Legally Authorized Decision Maker (e.g. Next of Kin): George Ward (son)        For Patients with Decision Making Capacity:   Values/Goals: Exploration of values, goals, and preferences if recovery is not expected, even with continued medical treatment in the event of:  Imminent death  Severe, permanent brain injury  "In these circumstances, what matters most to you?"  Care focused more on comfort or quality of life.    Conversation Outcomes / Follow-Up Plan:   Recommended completion of Advance Directive form after review of ACP materials and conversation with prospective healthcare agent

## 2016-08-19 NOTE — Progress Notes (Signed)
Identified pt with two pt identifiers(name and DOB).    Chief Complaint   Patient presents with   ??? Annual Wellness Visit     M[facility]        Health Maintenance Due   Topic   ??? MEDICARE YEARLY EXAM    ??? EYE EXAM RETINAL OR DILATED Q1    ??? FOBT Q 1 YEAR AGE 52-75    ??? PAP AKA CERVICAL CYTOLOGY    ??? HEMOGLOBIN A1C Q6M    ??? INFLUENZA AGE 46 TO ADULT        Wt Readings from Last 3 Encounters:   08/19/16 215 lb (97.5 kg)   05/27/16 219 lb (99.3 kg)   03/30/16 213 lb 6.4 oz (96.8 kg)     Temp Readings from Last 3 Encounters:   08/19/16 98.7 ??F (37.1 ??C) (Oral)   05/27/16 97.7 ??F (36.5 ??C) (Oral)   03/30/16 98.2 ??F (36.8 ??C) (Oral)     BP Readings from Last 3 Encounters:   08/19/16 (!) 154/112   05/27/16 140/90   03/30/16 131/83     Pulse Readings from Last 3 Encounters:   08/19/16 (!) 108   05/27/16 99   03/30/16 (!) 102         Learning Assessment:  :     Learning Assessment 04/26/2013 11/25/2012   PRIMARY LEARNER Patient Patient   HIGHEST LEVEL OF EDUCATION - PRIMARY LEARNER  SOME COLLEGE 2 YEARS OF COLLEGE   BARRIERS PRIMARY LEARNER NONE NONE   CO-LEARNER CAREGIVER No No   PRIMARY LANGUAGE ENGLISH ENGLISH   INTERPRETER NEED - No   LEARNER PREFERENCE PRIMARY LISTENING LISTENING   LEARNING SPECIAL TOPICS - no   ANSWERED BY self patient   RELATIONSHIP SELF SELF       Depression Screening:  :     PHQ over the last two weeks 03/30/2016   Little interest or pleasure in doing things Not at all   Feeling down, depressed or hopeless Several days   Total Score PHQ 2 1       Fall Risk Assessment:  :     Fall Risk Assessment, last 12 mths 03/30/2016   Able to walk? Yes   Fall in past 12 months? No       Abuse Screening:  :     Abuse Screening Questionnaire 05/27/2016 03/30/2016 04/26/2013   Do you ever feel afraid of your partner? N N N   Are you in a relationship with someone who physically or mentally threatens you? N N N   Is it safe for you to go home? Y Y Y       Coordination of Care Questionnaire:  :      1) Have you been to an emergency room, urgent care clinic since your last visit? no   Hospitalized since your last visit? no             2) Have you seen or consulted any other health care providers outside of [facility] since your last visit? no  (Include any pap smears or colon screenings in this section.)    3) Do you have an Advance Directive on file? no  Are you interested in receiving information about Advance Directives? no    Reviewed record in preparation for visit and have obtained necessary documentation.  Medication reconciliation up to date and corrected with patient at this time.

## 2016-08-20 LAB — LIPID PANEL
Cholesterol, total: 258 mg/dL — ABNORMAL HIGH (ref 100–199)
HDL Cholesterol: 30 mg/dL — ABNORMAL LOW (ref >39)
LDL, calculated: 167 mg/dL — ABNORMAL HIGH (ref 0–99)
Triglyceride: 306 mg/dL — ABNORMAL HIGH (ref 0–149)
VLDL, calculated: 61 mg/dL — ABNORMAL HIGH (ref 5–40)

## 2016-08-20 LAB — HEMOGLOBIN A1C WITH EAG
Estimated average glucose: 243 mg/dL
Hemoglobin A1c: 10.1 % — ABNORMAL HIGH (ref 4.8–5.6)

## 2016-08-20 LAB — CVD REPORT

## 2016-08-20 LAB — DIABETES PATIENT EDUCATION

## 2016-08-21 ENCOUNTER — Encounter

## 2016-08-21 MED ORDER — ATORVASTATIN 40 MG TAB
40 mg | ORAL_TABLET | Freq: Every day | ORAL | 0 refills | Status: AC
Start: 2016-08-21 — End: ?

## 2016-08-21 MED ORDER — TRAZODONE 150 MG TAB
150 mg | ORAL_TABLET | Freq: Every evening | ORAL | 0 refills | Status: AC
Start: 2016-08-21 — End: ?

## 2016-08-21 MED ORDER — ALPRAZOLAM 1 MG TAB
1 mg | ORAL_TABLET | Freq: Two times a day (BID) | ORAL | 0 refills | Status: AC | PRN
Start: 2016-08-21 — End: ?

## 2016-08-21 NOTE — Progress Notes (Signed)
Diabetes is uncontrolled (A1c has gone from 6.9 to 10.1), cholesterol uncontrolled. Office visit is needed to adjust diabetic medications; however, patient will be moving to [place] this weekend. Patient called and results were discussed. I have strongly encouraged her to find another provider as soon as possible as her diabetes needs better control. Increased atorvastatin to 40 mg daily. Patient verbalizes understanding.

## 2016-08-21 NOTE — Telephone Encounter (Signed)
Pt is moving tomorrow and is wanting to get all he prescriptions for the next month, she [place] out of [place] and will have to change insurances.

## 2016-08-21 NOTE — Telephone Encounter (Signed)
Patient called and most recent results discussed:     - Diabetes is uncontrolled with A1c of 10.1%.   - Lipids are not well controlled with LDL 167, TG 306 (likely in part to uncontrolled diabetes), TC 258.     Will increase dose of atorvastatin to 40 mg daily.     Diabetes needs follow up for medication titration. She will be moving to [place] this weekend and advised that she find a physician as soon as possible to take over the care of her diabetes. Patient verbalizes understanding.     Requested Prescriptions     Signed Prescriptions Disp Refills   ??? atorvastatin (LIPITOR) 40 mg tablet 90 Tab 0     Sig: Take 1 Tab by mouth daily.     Authorizing Provider: Dara LordsMURRAY, Faruq Rosenberger C   ??? traZODone (DESYREL) 150 mg tablet 90 Tab 0     Sig: Take 1 Tab by mouth nightly.     Authorizing Provider: Dara LordsMURRAY, Johara Lodwick C

## 2016-08-21 NOTE — Telephone Encounter (Signed)
Last visit was 08/19/2016  Last refill was 06/22/2016    Thank you!

## 2016-10-15 ENCOUNTER — Encounter

## 2016-10-15 NOTE — Telephone Encounter (Signed)
The pt is out of town and is requesting a refill on these three medications.

## 2016-10-15 NOTE — Telephone Encounter (Signed)
Per our last conversation, patient was moving to [place] and advised that she would need to establish care with a new provider.   - Trazodone 150 mg (#90) - filled at [facility] ([address] on 08/21/16.   - Atorvastatin 40 mg (#90) - filled at [facility] ([address]) on 08/21/16.   - Alprazolam 1 mg (#60) - filled at [facility] ([address] on 08/21/16.     (Medications fills confirmed with Pharmacy).     Please inform patient that medications have been denied as refills are not needed at this time.

## 2016-10-15 NOTE — Telephone Encounter (Signed)
Last refill was 08/21/2016  Last visit was 08/19/2016    Thank you!

## 2016-10-16 NOTE — Telephone Encounter (Signed)
Spoke to patient and she stated that she is having a hard time finding a doctor and most will not see her until the new year and some do not take her insurance. I told her that we could not continue prescribing medications and we had given her plenty of medication to last until she found a provider. She asked if Dr. Dayton ScrapeMurray would consider prescribing another month.

## 2016-11-01 ENCOUNTER — Emergency Department (HOSPITAL_COMMUNITY)
Admission: EM | Admit: 2016-11-01 | Discharge: 2016-11-01 | Disposition: A | Discharge status: Home / Self Care | Payer: Medicare Other | Attending: Emergency Medicine | Admitting: Emergency Medicine

## 2016-11-01 ENCOUNTER — Emergency Department (HOSPITAL_COMMUNITY): Payer: Medicare Other

## 2016-11-01 ENCOUNTER — Encounter (HOSPITAL_COMMUNITY): Payer: Self-pay

## 2016-11-01 DIAGNOSIS — I10 Essential (primary) hypertension: Secondary | ICD-10-CM | POA: Diagnosis not present

## 2016-11-01 DIAGNOSIS — K219 Gastro-esophageal reflux disease without esophagitis: Secondary | ICD-10-CM | POA: Diagnosis not present

## 2016-11-01 DIAGNOSIS — R0602 Shortness of breath: Secondary | ICD-10-CM | POA: Diagnosis not present

## 2016-11-01 DIAGNOSIS — R079 Chest pain, unspecified: Secondary | ICD-10-CM | POA: Diagnosis present

## 2016-11-01 DIAGNOSIS — F17200 Nicotine dependence, unspecified, uncomplicated: Secondary | ICD-10-CM | POA: Insufficient documentation

## 2016-11-01 DIAGNOSIS — Z7984 Long term (current) use of oral hypoglycemic drugs: Secondary | ICD-10-CM | POA: Insufficient documentation

## 2016-11-01 DIAGNOSIS — E119 Type 2 diabetes mellitus without complications: Secondary | ICD-10-CM | POA: Diagnosis not present

## 2016-11-01 DIAGNOSIS — R0789 Other chest pain: Secondary | ICD-10-CM

## 2016-11-01 HISTORY — DX: Type 2 diabetes mellitus without complications: E11.9

## 2016-11-01 HISTORY — DX: Gastro-esophageal reflux disease without esophagitis: K21.9

## 2016-11-01 HISTORY — DX: Essential (primary) hypertension: I10

## 2016-11-01 LAB — I-STAT TROPONIN, ED: Troponin i, poc: 0.01 ng/mL (ref 0.00–0.08)

## 2016-11-01 LAB — HEPATIC FUNCTION PANEL
ALT: 29 U/L (ref 14–54)
AST: 27 U/L (ref 15–41)
Albumin: 4 g/dL (ref 3.5–5.0)
Alkaline Phosphatase: 66 U/L (ref 38–126)
Bilirubin, Direct: <0.1 mg/dL — ABNORMAL LOW (ref 0.1–0.5)
Total Bilirubin: 0.2 mg/dL — ABNORMAL LOW (ref 0.3–1.2)
Total Protein: 8 g/dL (ref 6.5–8.1)

## 2016-11-01 LAB — CBC
HCT: 41.2 % (ref 36.0–46.0)
Hemoglobin: 13.4 g/dL (ref 12.0–15.0)
MCH: 29.1 pg (ref 26.0–34.0)
MCHC: 32.5 g/dL (ref 30.0–36.0)
MCV: 89.6 fL (ref 78.0–100.0)
Platelets: 217 10*3/uL (ref 150–400)
RBC: 4.6 MIL/uL (ref 3.87–5.11)
RDW: 14 % (ref 11.5–15.5)
WBC: 7.9 10*3/uL (ref 4.0–10.5)

## 2016-11-01 LAB — BASIC METABOLIC PANEL
Anion gap: 7 (ref 5–15)
BUN: 19 mg/dL (ref 6–20)
CO2: 25 mmol/L (ref 22–32)
Calcium: 9.5 mg/dL (ref 8.9–10.3)
Chloride: 104 mmol/L (ref 101–111)
Creatinine, Ser: 1.05 mg/dL — ABNORMAL HIGH (ref 0.44–1.00)
GFR calc Af Amer: >60 mL/min (ref >60)
GFR calc non Af Amer: 60 mL/min — ABNORMAL LOW (ref >60)
Glucose, Bld: 147 mg/dL — ABNORMAL HIGH (ref 65–99)
Potassium: 4.4 mmol/L (ref 3.5–5.1)
Sodium: 136 mmol/L (ref 135–145)

## 2016-11-01 LAB — LIPASE, BLOOD: Lipase: 54 U/L — ABNORMAL HIGH (ref 11–51)

## 2016-11-01 MED ORDER — OMEPRAZOLE 20 MG PO CPDR: 20.0000 mg | DELAYED_RELEASE_CAPSULE | Freq: Every day | ORAL | 0 refills | Status: DC

## 2016-11-01 MED ORDER — FAMOTIDINE 20 MG PO TABS: 20.0000 mg | ORAL_TABLET | Freq: Two times a day (BID) | ORAL | 0 refills | Status: DC

## 2016-11-01 MED ORDER — OMEPRAZOLE 40 MG PO CPDR: 40.0000 mg | DELAYED_RELEASE_CAPSULE | Freq: Every day | ORAL | 0 refills | Status: DC

## 2016-11-01 MED ORDER — GI COCKTAIL ~~LOC~~
30.0000 mL | Freq: Once | ORAL | Status: AC
  Administered 2016-11-01: 30 mL via ORAL
  Filled 2016-11-01: qty 30

## 2016-11-01 MED ORDER — FAMOTIDINE 20 MG PO TABS
20.0000 mg | ORAL_TABLET | Freq: Once | ORAL | Status: AC
Start: 2016-11-01 — End: 2016-11-01
  Administered 2016-11-01: 20 mg via ORAL
  Filled 2016-11-01: qty 1

## 2016-11-01 NOTE — ED Triage Notes (Signed)
Patient has multiple complaints. GERD with chest pain, states that her chest feels like she has bricks sitting on same. Also complains of ear itching and congestion

## 2016-11-01 NOTE — ED Provider Notes (Signed)
[facility]PT Provider Note   CSN: [id number] Arrival date & time: 11/01/16  1459     History   Chief Complaint Chief Complaint  Patient presents with  . Chest Pain  . Abdominal Pain    HPI Erin Jackson is a 50 y.o. female.  HPI  50 year old female with a past history of GERD, hypertension, and diabetes presents with a chief complaint of chest pain. States it feels like heavy bricks laying on her chest. Has been constant for the past 3 days. Has having associated left upper abdominal pain as well. Both of these feel like prior GERD issues. Has not taken anything for the pain. Recently moved to this area and states that she has not been able to get her Prilosec filled. She has some associated shortness of breath and the symptoms are much worse when lying flat. Also complains of headache for the past 3 weeks, is daily and 24/7. Seems to be worsening. Has some nausea with the chest pain but no vomiting. Has also been complaining of increased muscle spasms, has had these in the past but never this bad. They're mostly in her hands. Also complaining of bilateral ear itching for the past 2 days. Ear pain. No drainage.  Past Medical History:  Diagnosis Date  . Diabetes mellitus without complication (HCC)   . GERD (gastroesophageal reflux disease)   . Hypertension     There are no active problems to display for this patient.   History reviewed. No pertinent surgical history.  OB History    No data available       Home Medications    Prior to Admission medications   Medication Sig Start Date End Date Taking? Authorizing Provider  cloNIDine (CATAPRES) 0.2 MG tablet Take 0.4 mg by mouth at bedtime.   Yes Historical Provider, MD  diphenhydrAMINE (BENADRYL) 25 mg capsule Take 25 mg by mouth every 6 (six) hours as needed for itching.   Yes Historical Provider, MD  LOSARTAN POTASSIUM PO Take 1 tablet by mouth daily.   Yes Historical Provider, MD  metFORMIN (GLUCOPHAGE) 500 MG  tablet Take 500 mg by mouth 2 (two) times daily with a meal.   Yes Historical Provider, MD  traZODone (DESYREL) 150 MG tablet Take 150 mg by mouth at bedtime.   Yes Historical Provider, MD  famotidine (PEPCID) 20 MG tablet Take 1 tablet (20 mg total) by mouth 2 (two) times daily. 11/01/16   Pricilla LovelessScott Tyshika Baldridge, MD  omeprazole (PRILOSEC) 20 MG capsule Take 1 capsule (20 mg total) by mouth daily. 11/01/16   Pricilla LovelessScott Aalaysia Liggins, MD    Family History No family history on file.  Social History Social History  Substance Use Topics  . Smoking status: Current Every Day Smoker  . Smokeless tobacco: Never Used  . Alcohol use Not on file     Allergies   Aspirin; Ibuprofen; and Niacin and related   Review of Systems Review of Systems  HENT: Positive for ear pain.   Respiratory: Positive for shortness of breath.   Cardiovascular: Positive for chest pain.  Gastrointestinal: Positive for abdominal pain and nausea. Negative for vomiting.  Musculoskeletal: Positive for myalgias.  Neurological: Negative for weakness, numbness and headaches.  All other systems reviewed and are negative.    Physical Exam Updated Vital Signs BP 173/97   Pulse 95   Temp 98.4 F (36.9 C) (Oral)   Resp 20   Ht 5\' 2"  (1.575 m)   Wt 208 lb (94.3 kg)   SpO2  100%   BMI 38.04 kg/m   Physical Exam  Constitutional: She is oriented to person, place, and time. She appears well-developed and well-nourished.  Obese Sitting up, resting comfortably, no distress  HENT:  Head: Normocephalic and atraumatic.  Right Ear: Tympanic membrane, external ear and ear canal normal.  Left Ear: Tympanic membrane, external ear and ear canal normal.  Nose: Nose normal.  No scalp tenderness  Eyes: EOM are normal. Pupils are equal, round, and reactive to light. Right eye exhibits no discharge. Left eye exhibits no discharge.  Neck: Neck supple.  Cardiovascular: Normal rate, regular rhythm and normal heart sounds.   Pulmonary/Chest: Effort  normal and breath sounds normal. She exhibits tenderness.    Abdominal: Soft. There is tenderness in the left upper quadrant.  Neurological: She is alert and oriented to person, place, and time.  CN 3-12 grossly intact. 5/5 strength in all 4 extremities. Grossly normal sensation. Normal finger to nose.   Skin: Skin is warm and dry.  Nursing note and vitals reviewed.    ED Treatments / Results  Labs (all labs ordered are listed, but only abnormal results are displayed) Labs Reviewed  BASIC METABOLIC PANEL - Abnormal; Notable for the following:       Result Value   Glucose, Bld 147 (*)    Creatinine, Ser 1.05 (*)    GFR calc non Af Amer 60 (*)    All other components within normal limits  HEPATIC FUNCTION PANEL - Abnormal; Notable for the following:    Total Bilirubin 0.2 (*)    Bilirubin, Direct <0.1 (*)    All other components within normal limits  LIPASE, BLOOD - Abnormal; Notable for the following:    Lipase 54 (*)    All other components within normal limits  CBC  I-STAT TROPOININ, ED    EKG  EKG Interpretation  Date/Time:  Sunday November 01 2016 16:16:15 EST Ventricular Rate:  85 PR Interval:    QRS Duration: 86 QT Interval:  368 QTC Calculation: 438 R Axis:   72 Text Interpretation:  Normal sinus rhythm ST elev, probable normal early repol pattern Baseline wander in lead(s) II III aVR aVL aVF V3 V6 no reciprocal changes No old tracing to compare Confirmed by Annita Ratliff MD, Yuritzy Zehring [phone]) on 11/01/2016 4:30:11 PM       Radiology Dg Chest 2 View  Result Date: 11/01/2016 CLINICAL DATA:  Pt c/o heaviness in her left chest with sob x 2-3 days. Also reports itching in ears, headache, diarrhea, nausea. Hx htn, diabetes, smoker 1 pack lasts 4 days. No chest surgeries. Pt also reports muscle spasms all over body that .*comment was truncated* EXAM: CHEST  2 VIEW COMPARISON:  None. FINDINGS: Normal mediastinum and cardiac silhouette. Normal pulmonary vasculature. No  evidence of effusion, infiltrate, or pneumothorax. No acute bony abnormality. Anterior cervical fusion IMPRESSION: No acute cardiopulmonary process. Electronically Signed   By: Genevive Bi M.D.   On: 11/01/2016 16:09    Procedures Procedures (including critical care time)  Medications Ordered in ED Medications  gi cocktail (Maalox,Lidocaine,Donnatal) (30 mLs Oral Given 11/01/16 1616)  famotidine (PEPCID) tablet 20 mg (20 mg Oral Given 11/01/16 1747)     Initial Impression / Assessment and Plan / ED Course  I have reviewed the triage vital signs and the nursing notes.  Pertinent labs & imaging results that were available during my care of the patient were reviewed by me and considered in my medical decision making (see chart for details).  Clinical Course as of Nov 01 1750  Wynelle LinkSun Nov 01, 2016  1610 I believe patient's CP/abd is likely gastritis/GERD. Will give GI cocktail. Headache with unclear etiology but doubt serious CNS pathology with normal neuro exam, no vomiting. Refer to PCP. Doubt ACS, PE, dissection  [SG]  1739 CP gone after GI cocktail. Has some upper abd burning. Will give pepcid. Labs benign.   [SG]    Clinical Course User Index [SG] Pricilla LovelessScott Jahniyah Revere, MD    I think the patient's chest pain is likely reflux related. Very low suspicion for PE/dissection. Given benign ECG and negative troponin after 3 straight days of symptoms I do not think delta troponin as needed. Will restart on Prilosec and add on Pepcid. Encouraged follow-up with a PCP. Her headaches appear benign and I think she get this worked up as an outpatient. Unclear etiology for her ear itching. Discharge home with return precautions.  Final Clinical Impressions(s) / ED Diagnoses   Final diagnoses:  Atypical chest pain  Gastroesophageal reflux disease, esophagitis presence not specified    New Prescriptions Current Discharge Medication List    START taking these medications   Details  famotidine  (PEPCID) 20 MG tablet Take 1 tablet (20 mg total) by mouth 2 (two) times daily. Qty: 10 tablet, Refills: 0    omeprazole (PRILOSEC) 20 MG capsule Take 1 capsule (20 mg total) by mouth daily. Qty: 30 capsule, Refills: 0         Pricilla LovelessScott Yancarlos Berthold, MD 11/01/16 1752

## 2016-11-25 DIAGNOSIS — E1165 Type 2 diabetes mellitus with hyperglycemia: Secondary | ICD-10-CM | POA: Diagnosis not present

## 2016-11-25 DIAGNOSIS — I10 Essential (primary) hypertension: Secondary | ICD-10-CM | POA: Diagnosis not present

## 2016-11-25 DIAGNOSIS — F319 Bipolar disorder, unspecified: Secondary | ICD-10-CM | POA: Diagnosis not present

## 2016-11-25 DIAGNOSIS — E6609 Other obesity due to excess calories: Secondary | ICD-10-CM | POA: Diagnosis not present

## 2016-11-25 DIAGNOSIS — M545 Low back pain: Secondary | ICD-10-CM | POA: Diagnosis not present

## 2016-12-02 DIAGNOSIS — E1165 Type 2 diabetes mellitus with hyperglycemia: Secondary | ICD-10-CM | POA: Diagnosis not present

## 2016-12-02 DIAGNOSIS — I10 Essential (primary) hypertension: Secondary | ICD-10-CM | POA: Diagnosis not present

## 2016-12-02 DIAGNOSIS — M545 Low back pain: Secondary | ICD-10-CM | POA: Diagnosis not present

## 2016-12-02 DIAGNOSIS — F319 Bipolar disorder, unspecified: Secondary | ICD-10-CM | POA: Diagnosis not present

## 2016-12-03 ENCOUNTER — Other Ambulatory Visit (HOSPITAL_COMMUNITY): Payer: Self-pay | Admitting: Family Medicine

## 2016-12-03 DIAGNOSIS — R1319 Other dysphagia: Secondary | ICD-10-CM

## 2016-12-03 DIAGNOSIS — R1310 Dysphagia, unspecified: Secondary | ICD-10-CM

## 2016-12-08 ENCOUNTER — Ambulatory Visit (HOSPITAL_COMMUNITY): Payer: Medicare Other

## 2016-12-30 ENCOUNTER — Ambulatory Visit (HOSPITAL_COMMUNITY): Admission: RE | Admit: 2016-12-30 | Payer: Medicare Other | Source: Ambulatory Visit

## 2017-01-13 DIAGNOSIS — F319 Bipolar disorder, unspecified: Secondary | ICD-10-CM | POA: Diagnosis not present

## 2017-01-13 DIAGNOSIS — E1165 Type 2 diabetes mellitus with hyperglycemia: Secondary | ICD-10-CM | POA: Diagnosis not present

## 2017-01-13 DIAGNOSIS — E669 Obesity, unspecified: Secondary | ICD-10-CM | POA: Diagnosis not present

## 2017-01-13 DIAGNOSIS — M545 Low back pain: Secondary | ICD-10-CM | POA: Diagnosis not present

## 2017-01-20 ENCOUNTER — Encounter (HOSPITAL_COMMUNITY): Payer: Self-pay | Admitting: Emergency Medicine

## 2017-01-20 ENCOUNTER — Emergency Department (HOSPITAL_COMMUNITY)
Admission: EM | Admit: 2017-01-20 | Discharge: 2017-01-20 | Disposition: A | Discharge status: Home / Self Care | Payer: Medicare Other | Attending: Emergency Medicine | Admitting: Emergency Medicine

## 2017-01-20 ENCOUNTER — Emergency Department (HOSPITAL_COMMUNITY): Payer: Medicare Other

## 2017-01-20 DIAGNOSIS — I10 Essential (primary) hypertension: Secondary | ICD-10-CM | POA: Insufficient documentation

## 2017-01-20 DIAGNOSIS — H5712 Ocular pain, left eye: Secondary | ICD-10-CM | POA: Diagnosis not present

## 2017-01-20 DIAGNOSIS — E119 Type 2 diabetes mellitus without complications: Secondary | ICD-10-CM | POA: Insufficient documentation

## 2017-01-20 DIAGNOSIS — F17200 Nicotine dependence, unspecified, uncomplicated: Secondary | ICD-10-CM | POA: Diagnosis not present

## 2017-01-20 DIAGNOSIS — Z79899 Other long term (current) drug therapy: Secondary | ICD-10-CM | POA: Diagnosis not present

## 2017-01-20 DIAGNOSIS — R42 Dizziness and giddiness: Secondary | ICD-10-CM | POA: Diagnosis not present

## 2017-01-20 DIAGNOSIS — B373 Candidiasis of vulva and vagina: Secondary | ICD-10-CM | POA: Diagnosis not present

## 2017-01-20 DIAGNOSIS — Z7984 Long term (current) use of oral hypoglycemic drugs: Secondary | ICD-10-CM | POA: Insufficient documentation

## 2017-01-20 DIAGNOSIS — R202 Paresthesia of skin: Secondary | ICD-10-CM | POA: Diagnosis not present

## 2017-01-20 DIAGNOSIS — B379 Candidiasis, unspecified: Secondary | ICD-10-CM

## 2017-01-20 LAB — URINALYSIS, ROUTINE W REFLEX MICROSCOPIC
Bacteria, UA: NONE SEEN
Bilirubin Urine: NEGATIVE
Glucose, UA: NEGATIVE mg/dL
Hgb urine dipstick: NEGATIVE
Ketones, ur: NEGATIVE mg/dL
Leukocytes, UA: NEGATIVE
Nitrite: NEGATIVE
Protein, ur: 100 mg/dL — AB
Specific Gravity, Urine: 1.018 (ref 1.005–1.030)
pH: 5 (ref 5.0–8.0)

## 2017-01-20 LAB — BASIC METABOLIC PANEL
Anion gap: 8 (ref 5–15)
BUN: 20 mg/dL (ref 6–20)
CO2: 24 mmol/L (ref 22–32)
Calcium: 9.4 mg/dL (ref 8.9–10.3)
Chloride: 102 mmol/L (ref 101–111)
Creatinine, Ser: 0.85 mg/dL (ref 0.44–1.00)
GFR calc Af Amer: >60 mL/min (ref >60)
GFR calc non Af Amer: >60 mL/min (ref >60)
Glucose, Bld: 235 mg/dL — ABNORMAL HIGH (ref 65–99)
Potassium: 4.5 mmol/L (ref 3.5–5.1)
Sodium: 134 mmol/L — ABNORMAL LOW (ref 135–145)

## 2017-01-20 LAB — CBC
HCT: 41.2 % (ref 36.0–46.0)
Hemoglobin: 13.3 g/dL (ref 12.0–15.0)
MCH: 29 pg (ref 26.0–34.0)
MCHC: 32.3 g/dL (ref 30.0–36.0)
MCV: 89.8 fL (ref 78.0–100.0)
Platelets: 222 10*3/uL (ref 150–400)
RBC: 4.59 MIL/uL (ref 3.87–5.11)
RDW: 14.1 % (ref 11.5–15.5)
WBC: 6.8 10*3/uL (ref 4.0–10.5)

## 2017-01-20 LAB — CBG MONITORING, ED: Glucose-Capillary: 232 mg/dL — ABNORMAL HIGH (ref 65–99)

## 2017-01-20 MED ORDER — MECLIZINE HCL 25 MG PO TABS
25.0000 mg | ORAL_TABLET | Freq: Once | ORAL | Status: AC
  Administered 2017-01-20: 25 mg via ORAL
  Filled 2017-01-20: qty 1

## 2017-01-20 MED ORDER — FLUCONAZOLE 100 MG PO TABS
150.0000 mg | ORAL_TABLET | Freq: Once | ORAL | Status: AC
  Administered 2017-01-20: 150 mg via ORAL
  Filled 2017-01-20: qty 2

## 2017-01-20 MED ORDER — ACETAMINOPHEN 500 MG PO TABS
1000.0000 mg | ORAL_TABLET | Freq: Once | ORAL | Status: AC
  Administered 2017-01-20: 1000 mg via ORAL
  Filled 2017-01-20: qty 2

## 2017-01-20 NOTE — ED Provider Notes (Signed)
[facility] Provider Note   CSN: [id number] Arrival date & time: 01/20/17  4098     History   Chief Complaint Chief Complaint  Patient presents with  . Dizziness  . Urinary Tract Infection    HPI Erin Jackson is a 51 y.o. female.  HPI here for evaluation of dizziness and possible UTI. Patient reports this morning she woke up at 8:30 AM, was walking to the bathroom when she experienced sudden onset dizziness that she cannot further characterize. Reports that she was "seeing stars", had to sit down on the toilet. She reports the whole episode lasted roughly 15 minutes. She reports a tingling sensation under her left eye and cheek as well as pain on the left side of her eye and head. She reports her left eye vision is still somewhat blurry. No overt headache, scalp pain or jaw claudication.  Also reports a lump on her vaginal area, concerned she may have a Yeast infection. Reports she has been getting them frequently due to new medications-was advised this would happen by her PCP. Denies any other fevers, chills, other focal numbness or weakness, abdominal pain, chest pain or shortness of breath. Nothing tried to improve symptoms. Has not taken any of her medications this morning. Hx HTN, DM and is a current cigarette smoker  Past Medical History:  Diagnosis Date  . Diabetes mellitus without complication (HCC)   . GERD (gastroesophageal reflux disease)   . Hypertension     There are no active problems to display for this patient.   History reviewed. No pertinent surgical history.  OB History    No data available       Home Medications    Prior to Admission medications   Medication Sig Start Date End Date Taking? Authorizing Provider  alprazolam Prudy Feeler) 2 MG tablet Take 2 mg by mouth 2 (two) times daily as needed for sleep.   Yes Historical Provider, MD  amLODipine (NORVASC) 5 MG tablet Take 5 mg by mouth 2 (two) times daily.   Yes Historical Provider, MD   diphenhydrAMINE (BENADRYL) 25 mg capsule Take 25 mg by mouth every 6 (six) hours as needed for itching.   Yes Historical Provider, MD  famotidine (PEPCID) 20 MG tablet Take 1 tablet (20 mg total) by mouth 2 (two) times daily. 11/01/16  Yes Pricilla Loveless, MD  metFORMIN (GLUCOPHAGE) 500 MG tablet Take 500 mg by mouth 2 (two) times daily with a meal.   Yes Historical Provider, MD  omeprazole (PRILOSEC) 20 MG capsule Take 1 capsule (20 mg total) by mouth daily. 11/01/16  Yes Pricilla Loveless, MD  telmisartan-hydrochlorothiazide (MICARDIS HCT) 80-12.5 MG tablet Take 1 tablet by mouth daily.   Yes Historical Provider, MD  traZODone (DESYREL) 150 MG tablet Take 150 mg by mouth at bedtime.   Yes Historical Provider, MD  cloNIDine (CATAPRES) 0.2 MG tablet Take 0.4 mg by mouth at bedtime.    Historical Provider, MD  LOSARTAN POTASSIUM PO Take 1 tablet by mouth daily.    Historical Provider, MD    Family History No family history on file.  Social History Social History  Substance Use Topics  . Smoking status: Current Every Day Smoker  . Smokeless tobacco: Never Used  . Alcohol use Not on file     Allergies   Aspirin; Ibuprofen; and Niacin and related   Review of Systems Review of Systems A 10 point review of systems was completed and was negative except for pertinent positives and negatives as mentioned  in the history of present illness    Physical Exam Updated Vital Signs BP (!) 156/103   Pulse 95   Temp 98.1 F (36.7 C)   Resp 20   SpO2 100%   Physical Exam  Constitutional: She is oriented to person, place, and time. She appears well-developed. No distress.  Awake, alert and nontoxic in appearance  HENT:  Head: Normocephalic and atraumatic.  Right Ear: External ear normal.  Left Ear: External ear normal.  Mouth/Throat: Oropharynx is clear and moist.  Eyes: Conjunctivae and EOM are normal. Pupils are equal, round, and reactive to light.  Reports pain around left orbit with  closing her eyes tightly.  Neck: Normal range of motion. No JVD present.  Cardiovascular: Normal rate, regular rhythm and normal heart sounds.   Pulmonary/Chest: Effort normal and breath sounds normal. No stridor.  Abdominal: Soft. There is no tenderness.  Genitourinary:  Genitourinary Comments: Female chaperone for genitourinary exam. Patient apparently has bilateral, mildly enlarged skene glands. Also has mild white curdy discharge.  Musculoskeletal: Normal range of motion.  Neurological: She is alert and oriented to person, place, and time. She displays normal reflexes. Coordination normal.  Awake, alert, cooperative and aware of situation; motor strength intact and equal bilaterally; sensation seems grossly intact to light touch. However just inferior to left orbit and lower lid, patient reports light touch discrepancy but normalizes around left mid cheek. No numbness. No facial asymmetry; tongue midline; major cranial nerves appear intact;  baseline gait without new ataxia.  Skin: No rash noted. She is not diaphoretic.  Psychiatric: She has a normal mood and affect. Her behavior is normal. Thought content normal.  Nursing note and vitals reviewed.    ED Treatments / Results  Labs (all labs ordered are listed, but only abnormal results are displayed) Labs Reviewed  BASIC METABOLIC PANEL - Abnormal; Notable for the following:       Result Value   Sodium 134 (*)    Glucose, Bld 235 (*)    All other components within normal limits  URINALYSIS, ROUTINE W REFLEX MICROSCOPIC - Abnormal; Notable for the following:    Protein, ur 100 (*)    Squamous Epithelial / LPF 0-5 (*)    All other components within normal limits  CBG MONITORING, ED - Abnormal; Notable for the following:    Glucose-Capillary 232 (*)    All other components within normal limits  CBC    EKG  EKG Interpretation None       Radiology Ct Head Wo Contrast  Result Date: 01/20/2017 CLINICAL DATA:  Left facial  tingling.  left vision change EXAM: CT HEAD WITHOUT CONTRAST TECHNIQUE: Contiguous axial images were obtained from the base of the skull through the vertex without intravenous contrast. COMPARISON:  None. FINDINGS: Brain: No acute intracranial abnormality. Specifically, no hemorrhage, hydrocephalus, mass lesion, acute infarction, or significant intracranial injury. Vascular: No hyperdense vessel or unexpected calcification. Skull: No acute calvarial abnormality. Sinuses/Orbits: Visualized paranasal sinuses and mastoids clear. Orbital soft tissues unremarkable. Small osteoma in the right ethmoid air cells. Other: None IMPRESSION: No intracranial abnormality. Electronically Signed   By: Charlett NoseKevin  Dover M.D.   On: 01/20/2017 11:49    Procedures Procedures (including critical care time)  Medications Ordered in ED Medications  fluconazole (DIFLUCAN) tablet 150 mg (not administered)  meclizine (ANTIVERT) tablet 25 mg (25 mg Oral Given 01/20/17 1232)  acetaminophen (TYLENOL) tablet 1,000 mg (1,000 mg Oral Given 01/20/17 1302)     Initial Impression / Assessment and  Plan / ED Course  I have reviewed the triage vital signs and the nursing notes.  Pertinent labs & imaging results that were available during my care of the patient were reviewed by me and considered in my medical decision making (see chart for details).     Does not seem consistent with acute stroke. However, obtaining CT head for further evaluation.  Labs, imaging and exam are all reassuring. Patient reports relief after Tylenol in ED. Upon multiple re-evaluations, patient is in no apparent distress, playing and watching videos on phone. No abnormal coordination or distress.  Treated for mild yeast infection with Diflucan. Discussed follow-up with PCP. Advised to take daily medications. Discussed return precautions as well as signs and symptoms necessitate prompted the evaluation. Prior to patient discharge, I discussed and reviewed this  case with Dr.Wentz     Final Clinical Impressions(s) / ED Diagnoses   Final diagnoses:  Yeast infection  Dizziness    New Prescriptions New Prescriptions   No medications on file     Joycie Peek, PA-C 01/20/17 1319    Mancel Bale, MD 01/20/17 1609

## 2017-01-20 NOTE — ED Triage Notes (Signed)
statews woke up this am had a bout of dizziness and it also hurts when she pees and she has a  Lumps in her vaginal region

## 2017-01-20 NOTE — ED Provider Notes (Signed)
  Face-to-face evaluation   History: She reports onset of dizziness while walking home this morning. She felt better after sitting down. She also feels an odd sensation in the left side of her face, but is unable to characterize it. She continues to be able to walk normally. She denies headache. She came here by private vehicle. She is concerned that her blood pressure medicine. She was given caused a "yeast infection and a knot on her vagina."  Physical exam: Alert, calm, cooperative. No dysarthria, aphasia or nystagmus. No pronator drift. No facial asymmetry. Nonspecific dysesthesia of the left face.  Medical screening examination/treatment/procedure(s) were conducted as a shared visit with non-physician practitioner(s) and myself.  I personally evaluated the patient during the encounter  Mancel BaleElliott Rhyder Koegel, MD 01/20/17 [phone]

## 2017-01-20 NOTE — Discharge Instructions (Signed)
Your exam, labs and imaging were very reassuring today. Please follow-up with your doctor for further evaluation management of your symptoms. Return to ED for new or worsening symptoms as we discussed.

## 2017-01-20 NOTE — ED Notes (Signed)
EKG completed given to EDP.  

## 2017-01-20 NOTE — ED Notes (Signed)
PA at bedside.

## 2017-01-20 NOTE — ED Notes (Signed)
Doctor notified of patients symptoms.

## 2017-02-02 DIAGNOSIS — E1165 Type 2 diabetes mellitus with hyperglycemia: Secondary | ICD-10-CM | POA: Diagnosis not present

## 2017-02-02 DIAGNOSIS — I10 Essential (primary) hypertension: Secondary | ICD-10-CM | POA: Diagnosis not present

## 2017-02-02 DIAGNOSIS — Z111 Encounter for screening for respiratory tuberculosis: Secondary | ICD-10-CM | POA: Diagnosis not present

## 2017-02-02 DIAGNOSIS — E669 Obesity, unspecified: Secondary | ICD-10-CM | POA: Diagnosis not present

## 2017-02-02 DIAGNOSIS — F319 Bipolar disorder, unspecified: Secondary | ICD-10-CM | POA: Diagnosis not present

## 2017-02-02 DIAGNOSIS — J399 Disease of upper respiratory tract, unspecified: Secondary | ICD-10-CM | POA: Diagnosis not present

## 2017-02-03 ENCOUNTER — Ambulatory Visit: Payer: Medicare Other | Admitting: Obstetrics

## 2017-02-15 ENCOUNTER — Encounter (HOSPITAL_COMMUNITY): Payer: Self-pay | Admitting: Family Medicine

## 2017-02-15 ENCOUNTER — Ambulatory Visit (HOSPITAL_COMMUNITY)
Admission: EM | Admit: 2017-02-15 | Discharge: 2017-02-15 | Disposition: A | Discharge status: Home / Self Care | Payer: Medicare Other | Attending: Family Medicine | Admitting: Family Medicine

## 2017-02-15 DIAGNOSIS — L02411 Cutaneous abscess of right axilla: Secondary | ICD-10-CM

## 2017-02-15 MED ORDER — SULFAMETHOXAZOLE-TRIMETHOPRIM 800-160 MG PO TABS: 1.0000 | ORAL_TABLET | Freq: Two times a day (BID) | ORAL | 0 refills | Status: AC

## 2017-02-15 NOTE — ED Provider Notes (Signed)
CSN: [id number]     Arrival date & time 02/15/17  1009 History   First MD Initiated Contact with Patient 02/15/17 1135     Chief Complaint  Patient presents with  . Abscess   (Consider location/radiation/quality/duration/timing/severity/associated sxs/prior Treatment) Patient c/o absess right axilla.   The history is provided by the patient.  Abscess  Location:  Shoulder/arm Shoulder/arm abscess location: right axilla. Abscess quality: fluctuance, painful and redness   Red streaking: no   Duration:  1 week Progression:  Worsening Pain details:    Quality:  Sharp   Severity:  Moderate   Duration:  2 weeks   Timing:  Constant   Progression:  Worsening Chronicity:  New Relieved by:  None tried Worsened by:  Nothing Ineffective treatments:  None tried   Past Medical History:  Diagnosis Date  . Diabetes mellitus without complication (HCC)   . GERD (gastroesophageal reflux disease)   . Hypertension    History reviewed. No pertinent surgical history. History reviewed. No pertinent family history. Social History  Substance Use Topics  . Smoking status: Current Every Day Smoker  . Smokeless tobacco: Never Used  . Alcohol use Not on file   OB History    No data available     Review of Systems  Constitutional: Negative.   HENT: Negative.   Eyes: Negative.   Respiratory: Negative.   Cardiovascular: Negative.   Gastrointestinal: Negative.   Endocrine: Negative.   Genitourinary: Negative.   Musculoskeletal: Negative.   Skin: Positive for wound.  Allergic/Immunologic: Negative.   Neurological: Negative.   Hematological: Negative.   Psychiatric/Behavioral: Negative.     Allergies  Aspirin; Ibuprofen; and Niacin and related  Home Medications   Prior to Admission medications   Medication Sig Start Date End Date Taking? Authorizing Provider  alprazolam Prudy Feeler(XANAX) 2 MG tablet Take 2 mg by mouth 2 (two) times daily as needed for sleep.    Historical Provider, MD   amLODipine (NORVASC) 5 MG tablet Take 5 mg by mouth 2 (two) times daily.    Historical Provider, MD  cloNIDine (CATAPRES) 0.2 MG tablet Take 0.4 mg by mouth at bedtime.    Historical Provider, MD  diphenhydrAMINE (BENADRYL) 25 mg capsule Take 25 mg by mouth every 6 (six) hours as needed for itching.    Historical Provider, MD  famotidine (PEPCID) 20 MG tablet Take 1 tablet (20 mg total) by mouth 2 (two) times daily. 11/01/16   Pricilla LovelessScott Goldston, MD  LOSARTAN POTASSIUM PO Take 1 tablet by mouth daily.    Historical Provider, MD  metFORMIN (GLUCOPHAGE) 500 MG tablet Take 500 mg by mouth 2 (two) times daily with a meal.    Historical Provider, MD  omeprazole (PRILOSEC) 20 MG capsule Take 1 capsule (20 mg total) by mouth daily. 11/01/16   Pricilla LovelessScott Goldston, MD  sulfamethoxazole-trimethoprim (BACTRIM DS,SEPTRA DS) 800-160 MG tablet Take 1 tablet by mouth 2 (two) times daily. 02/15/17 02/22/17  Deatra CanterWilliam J Kileen Lange, FNP  telmisartan-hydrochlorothiazide (MICARDIS HCT) 80-12.5 MG tablet Take 1 tablet by mouth daily.    Historical Provider, MD  traZODone (DESYREL) 150 MG tablet Take 150 mg by mouth at bedtime.    Historical Provider, MD   Meds Ordered and Administered this Visit  Medications - No data to display  BP (!) 146/110   Pulse 98   Temp 98.5 F (36.9 C)   Resp 18   SpO2 100%  No data found.   Physical Exam  Constitutional: She appears well-developed and well-nourished.  HENT:  Head: Normocephalic and atraumatic.  Right Ear: External ear normal.  Left Ear: External ear normal.  Mouth/Throat: Oropharynx is clear and moist.  Eyes: Conjunctivae and EOM are normal. Pupils are equal, round, and reactive to light.  Neck: Normal range of motion. Neck supple.  Cardiovascular: Normal rate, regular rhythm and normal heart sounds.   Pulmonary/Chest: Effort normal and breath sounds normal.  Skin:  Right axilla with erythematous fluctuant mass/ abscess.  Nursing note and vitals reviewed.   Urgent Care  Course     .Marland KitchenIncision and Drainage Date/Time: 02/15/2017 12:18 PM Performed by: Deatra Canter Authorized by: Deatra Canter   Consent:    Consent obtained:  Verbal   Consent given by:  Patient   Risks discussed:  Bleeding, incomplete drainage, pain and infection   Alternatives discussed:  No treatment Location:    Type:  Abscess   Size:  2 cm   Location:  Upper extremity   Upper extremity location:  Arm   Arm location:  R upper arm (Right axilla) Pre-procedure details:    Skin preparation:  Betadine Anesthesia (see MAR for exact dosages):    Anesthesia method:  Local infiltration   Local anesthetic:  Lidocaine 2% WITH epi Procedure type:    Complexity:  Simple Procedure details:    Needle aspiration: no     Incision types:  Stab incision   Incision depth:  Dermal   Scalpel blade:  11   Wound management:  Probed and deloculated   Drainage:  Bloody   Drainage amount:  Moderate   Wound treatment:  Wound left open   Packing materials:  None Post-procedure details:    Patient tolerance of procedure:  Tolerated well, no immediate complications   (including critical care time)  Labs Review Labs Reviewed - No data to display  Imaging Review No results found.   Visual Acuity Review  Right Eye Distance:   Left Eye Distance:   Bilateral Distance:    Right Eye Near:   Left Eye Near:    Bilateral Near:         MDM   1. Abscess of axilla, right    Incision and drainage right axillary abscess  Bactrim DS one po bid x 10 days #20  Bulky bandage applied and follow up prn, no packing.    Deatra Canter, FNP 02/15/17 [id number]

## 2017-02-15 NOTE — ED Triage Notes (Signed)
Pt here for abscess to right axilla area/ sts she has been using heat to bring abscess  to a head.

## 2017-03-13 ENCOUNTER — Encounter (HOSPITAL_COMMUNITY): Payer: Self-pay | Admitting: Emergency Medicine

## 2017-03-13 ENCOUNTER — Emergency Department (HOSPITAL_COMMUNITY)
Admission: EM | Admit: 2017-03-13 | Discharge: 2017-03-13 | Disposition: A | Discharge status: Home / Self Care | Payer: Medicare Other | Attending: Emergency Medicine | Admitting: Emergency Medicine

## 2017-03-13 DIAGNOSIS — F17200 Nicotine dependence, unspecified, uncomplicated: Secondary | ICD-10-CM | POA: Insufficient documentation

## 2017-03-13 DIAGNOSIS — J018 Other acute sinusitis: Secondary | ICD-10-CM | POA: Diagnosis not present

## 2017-03-13 DIAGNOSIS — I10 Essential (primary) hypertension: Secondary | ICD-10-CM | POA: Insufficient documentation

## 2017-03-13 DIAGNOSIS — Z7984 Long term (current) use of oral hypoglycemic drugs: Secondary | ICD-10-CM | POA: Diagnosis not present

## 2017-03-13 DIAGNOSIS — E119 Type 2 diabetes mellitus without complications: Secondary | ICD-10-CM | POA: Insufficient documentation

## 2017-03-13 DIAGNOSIS — R51 Headache: Secondary | ICD-10-CM | POA: Diagnosis present

## 2017-03-13 DIAGNOSIS — Z79899 Other long term (current) drug therapy: Secondary | ICD-10-CM | POA: Diagnosis not present

## 2017-03-13 MED ORDER — FLUTICASONE PROPIONATE 50 MCG/ACT NA SUSP: 2.0000 | Freq: Every day | NASAL | 0 refills | Status: DC

## 2017-03-13 MED ORDER — AMOXICILLIN-POT CLAVULANATE 875-125 MG PO TABS: 1.0000 | ORAL_TABLET | Freq: Two times a day (BID) | ORAL | 0 refills | Status: DC

## 2017-03-13 NOTE — ED Notes (Signed)
Declined W/C at D/C and was escorted to lobby by RN. 

## 2017-03-13 NOTE — ED Triage Notes (Signed)
Pt. Stated, Im sure I have a sinus infection and I have so much facial pain or pressure. My ears are bothering me.

## 2017-03-13 NOTE — ED Provider Notes (Signed)
[facility]-EMERGENCY DEPT Provider Note   CSN: [id number] Arrival date & time: 03/13/17  1153  By signing my name below, I, Marnette Burgessyan Andrew Long, attest that this documentation has been prepared under the direction and in the presence of Burgess AmorJulie Kross Swallows, PA-C. Electronically Signed: Marnette Burgessyan Andrew Long, Scribe. 03/13/2017. 1:15 PM.  History   Chief Complaint Chief Complaint  Patient presents with  . Recurrent Sinusitis  . Facial Pain   The history is provided by the patient and medical records. No language interpreter was used.   HPI Comments:  Erin Jackson is an obese 51 y.o. female with a PMHx of DM, GERD, and HTN, who presents to the Emergency Department complaining of constant, gradually worsening, sinus pressure onset three days ago. She reports it feels like "a ton of bricks is on my face". Pt is unsure if this is allergy related at this time. Pt has associated symptoms of eye itching, sneezing, ear itching and fullness, nasal congestion with yellow mucus with bloody streaks, sinus pain, rhinorrhea, jaw discomfort, and a subjective fever. She tried Tylenol Sinus, Tylenol Cold, Vicks Vapor, and steam at home with no relief of her symptoms. Direct Pressure and palpation exacerbate her sinus pressure with no alleviating factors noted. Pt denies dental problem and any other complaints at this time. Pt is a current every day smoker.   PCP: Dr. Parke SimmersBland   Past Medical History:  Diagnosis Date  . Diabetes mellitus without complication (HCC)   . GERD (gastroesophageal reflux disease)   . Hypertension    There are no active problems to display for this patient.  History reviewed. No pertinent surgical history.  OB History    No data available      Home Medications    Prior to Admission medications   Medication Sig Start Date End Date Taking? Authorizing Provider  alprazolam Prudy Feeler(XANAX) 2 MG tablet Take 2 mg by mouth 2 (two) times daily as needed for sleep.    Historical Provider, MD  amLODipine  (NORVASC) 5 MG tablet Take 5 mg by mouth 2 (two) times daily.    Historical Provider, MD  amoxicillin-clavulanate (AUGMENTIN) 875-125 MG tablet Take 1 tablet by mouth every 12 (twelve) hours. 03/13/17   Burgess AmorJulie Milka Windholz, PA-C  cloNIDine (CATAPRES) 0.2 MG tablet Take 0.4 mg by mouth at bedtime.    Historical Provider, MD  diphenhydrAMINE (BENADRYL) 25 mg capsule Take 25 mg by mouth every 6 (six) hours as needed for itching.    Historical Provider, MD  famotidine (PEPCID) 20 MG tablet Take 1 tablet (20 mg total) by mouth 2 (two) times daily. 11/01/16   Pricilla LovelessScott Goldston, MD  fluticasone (FLONASE) 50 MCG/ACT nasal spray Place 2 sprays into both nostrils daily. 03/13/17   Burgess AmorJulie Leiah Giannotti, PA-C  LOSARTAN POTASSIUM PO Take 1 tablet by mouth daily.    Historical Provider, MD  metFORMIN (GLUCOPHAGE) 500 MG tablet Take 500 mg by mouth 2 (two) times daily with a meal.    Historical Provider, MD  omeprazole (PRILOSEC) 20 MG capsule Take 1 capsule (20 mg total) by mouth daily. 11/01/16   Pricilla LovelessScott Goldston, MD  telmisartan-hydrochlorothiazide (MICARDIS HCT) 80-12.5 MG tablet Take 1 tablet by mouth daily.    Historical Provider, MD  traZODone (DESYREL) 150 MG tablet Take 150 mg by mouth at bedtime.    Historical Provider, MD    Family History No family history on file.  Social History Social History  Substance Use Topics  . Smoking status: Current Every Day Smoker  . Smokeless tobacco:  Never Used  . Alcohol use Not on file    Allergies   Aspirin; Ibuprofen; and Niacin and related  Review of Systems Review of Systems  Constitutional: Positive for fever (subjective, resolved ).  HENT: Positive for congestion, rhinorrhea, sinus pain, sinus pressure and sneezing. Negative for dental problem.        Positive Ear itching and sensation of fullness.   Eyes: Positive for itching.  Musculoskeletal:       Positive jaw discomfort     Physical Exam Updated Vital Signs BP (!) 149/95 (BP Location: Left Arm)   Pulse 94    Temp 98.9 F (37.2 C) (Oral)   Resp 16   Ht 5\' 2"  (1.575 m)   Wt 95.3 kg   SpO2 100%   BMI 38.41 kg/m   Physical Exam  Constitutional: She is oriented to person, place, and time. She appears well-developed and well-nourished.  HENT:  Head: Normocephalic.  Bilateral nasal congestion with yellow discharge in left naris. TTP over her bilateral maxillary sinuses. Ears are clear including TMs normal. Pharynx clear. Dentition appears healthy. No head or neck adenopathy.   Eyes: Conjunctivae are normal.  Cardiovascular: Normal rate.   Pulmonary/Chest: Effort normal.  Abdominal: She exhibits no distension.  Musculoskeletal: Normal range of motion.  Neurological: She is alert and oriented to person, place, and time.  Skin: Skin is warm and dry.  Psychiatric: She has a normal mood and affect.  Nursing note and vitals reviewed.   ED Treatments / Results  DIAGNOSTIC STUDIES:  Oxygen Saturation is 100% on RA, normal by my interpretation.    COORDINATION OF CARE:  1:14 PM Discussed treatment plan with pt at bedside including Flonase and pt agreed to plan.  Labs (all labs ordered are listed, but only abnormal results are displayed) Labs Reviewed - No data to display  EKG  EKG Interpretation None       Radiology No results found.  Procedures Procedures (including critical care time)  Medications Ordered in ED Medications - No data to display   Initial Impression / Assessment and Plan / ED Course  I have reviewed the triage vital signs and the nursing notes.  Pertinent labs & imaging results that were available during my care of the patient were reviewed by me and considered in my medical decision making (see chart for details).     Pt advised this is probably season allergy induced sx, she has been taking Tylenol sinus and tylenol cold formula.  Advised these meds may increase her bp, advised coricidin if needed for congestion. Added flonase nasal spray. Doubt sinusitis  at this time, but encouraged to get the antibiotic filled and start taking if she develops worsened sx including fevers, pressure not relieve with flonase, purulent or bloody nasal dc, but to hold for the next several days.    Final Clinical Impressions(s) / ED Diagnoses   Final diagnoses:  Acute non-recurrent sinusitis of other sinus    New Prescriptions Discharge Medication List as of 03/13/2017  1:19 PM    START taking these medications   Details  amoxicillin-clavulanate (AUGMENTIN) 875-125 MG tablet Take 1 tablet by mouth every 12 (twelve) hours., Starting Sat 03/13/2017, Print    fluticasone (FLONASE) 50 MCG/ACT nasal spray Place 2 sprays into both nostrils daily., Starting Sat 03/13/2017, Print       I personally performed the services described in this documentation, which was scribed in my presence. The recorded information has been reviewed and is accurate.  Burgess Amor, PA-C 03/13/17 1636    Canary Brim Tegeler, MD 03/13/17 2012

## 2017-04-29 DIAGNOSIS — E6609 Other obesity due to excess calories: Secondary | ICD-10-CM | POA: Diagnosis not present

## 2017-04-29 DIAGNOSIS — F319 Bipolar disorder, unspecified: Secondary | ICD-10-CM | POA: Diagnosis not present

## 2017-04-29 DIAGNOSIS — Z6837 Body mass index (BMI) 37.0-37.9, adult: Secondary | ICD-10-CM | POA: Diagnosis not present

## 2017-04-29 DIAGNOSIS — M545 Low back pain: Secondary | ICD-10-CM | POA: Diagnosis not present

## 2017-04-29 DIAGNOSIS — E1165 Type 2 diabetes mellitus with hyperglycemia: Secondary | ICD-10-CM | POA: Diagnosis not present

## 2017-05-18 ENCOUNTER — Ambulatory Visit: Payer: Self-pay | Admitting: Medical

## 2017-05-20 DIAGNOSIS — I10 Essential (primary) hypertension: Secondary | ICD-10-CM | POA: Diagnosis not present

## 2017-05-20 DIAGNOSIS — M542 Cervicalgia: Secondary | ICD-10-CM | POA: Diagnosis not present

## 2017-05-23 ENCOUNTER — Encounter (HOSPITAL_COMMUNITY): Payer: Self-pay

## 2017-05-23 ENCOUNTER — Emergency Department (HOSPITAL_COMMUNITY)
Admission: EM | Admit: 2017-05-23 | Discharge: 2017-05-23 | Disposition: A | Discharge status: Home / Self Care | Payer: Medicare HMO | Attending: Emergency Medicine | Admitting: Emergency Medicine

## 2017-05-23 DIAGNOSIS — Z5321 Procedure and treatment not carried out due to patient leaving prior to being seen by health care provider: Secondary | ICD-10-CM | POA: Diagnosis not present

## 2017-05-23 DIAGNOSIS — M25511 Pain in right shoulder: Secondary | ICD-10-CM | POA: Insufficient documentation

## 2017-05-23 DIAGNOSIS — M25512 Pain in left shoulder: Secondary | ICD-10-CM | POA: Diagnosis not present

## 2017-05-23 NOTE — ED Triage Notes (Signed)
Pt complaining of bilateral shoulder pain that radiates to bilateral hands x months. Pt states numbness and tingling to hands. Pt states scheduled appointment with orthopedic MD. Pt states increased pain today. Denies any new injury/trauma.

## 2017-06-28 DIAGNOSIS — M79601 Pain in right arm: Secondary | ICD-10-CM | POA: Diagnosis not present

## 2017-07-12 DIAGNOSIS — G5603 Carpal tunnel syndrome, bilateral upper limbs: Secondary | ICD-10-CM | POA: Diagnosis not present

## 2017-07-12 DIAGNOSIS — M542 Cervicalgia: Secondary | ICD-10-CM | POA: Diagnosis not present

## 2017-07-12 DIAGNOSIS — M5412 Radiculopathy, cervical region: Secondary | ICD-10-CM | POA: Diagnosis not present

## 2017-07-20 DIAGNOSIS — M542 Cervicalgia: Secondary | ICD-10-CM | POA: Diagnosis not present

## 2017-07-20 DIAGNOSIS — M79601 Pain in right arm: Secondary | ICD-10-CM | POA: Diagnosis not present

## 2017-07-20 DIAGNOSIS — M6281 Muscle weakness (generalized): Secondary | ICD-10-CM | POA: Diagnosis not present

## 2017-07-20 DIAGNOSIS — M79602 Pain in left arm: Secondary | ICD-10-CM | POA: Diagnosis not present

## 2017-07-27 DIAGNOSIS — M6281 Muscle weakness (generalized): Secondary | ICD-10-CM | POA: Diagnosis not present

## 2017-07-27 DIAGNOSIS — M542 Cervicalgia: Secondary | ICD-10-CM | POA: Diagnosis not present

## 2017-07-27 DIAGNOSIS — M79602 Pain in left arm: Secondary | ICD-10-CM | POA: Diagnosis not present

## 2017-07-27 DIAGNOSIS — M79601 Pain in right arm: Secondary | ICD-10-CM | POA: Diagnosis not present

## 2017-07-29 DIAGNOSIS — M79602 Pain in left arm: Secondary | ICD-10-CM | POA: Diagnosis not present

## 2017-07-29 DIAGNOSIS — M6281 Muscle weakness (generalized): Secondary | ICD-10-CM | POA: Diagnosis not present

## 2017-07-29 DIAGNOSIS — M542 Cervicalgia: Secondary | ICD-10-CM | POA: Diagnosis not present

## 2017-07-29 DIAGNOSIS — M79601 Pain in right arm: Secondary | ICD-10-CM | POA: Diagnosis not present

## 2017-07-30 ENCOUNTER — Ambulatory Visit
Admission: RE | Admit: 2017-07-30 | Discharge: 2017-07-30 | Disposition: A | Discharge status: Home / Self Care | Payer: Medicare HMO | Source: Ambulatory Visit | Attending: Family Medicine | Admitting: Family Medicine

## 2017-07-30 ENCOUNTER — Other Ambulatory Visit: Payer: Self-pay | Admitting: Family Medicine

## 2017-07-30 DIAGNOSIS — R221 Localized swelling, mass and lump, neck: Secondary | ICD-10-CM

## 2017-07-30 DIAGNOSIS — M7989 Other specified soft tissue disorders: Secondary | ICD-10-CM | POA: Diagnosis not present

## 2017-07-30 DIAGNOSIS — F319 Bipolar disorder, unspecified: Secondary | ICD-10-CM | POA: Diagnosis not present

## 2017-07-30 DIAGNOSIS — Z716 Tobacco abuse counseling: Secondary | ICD-10-CM | POA: Diagnosis not present

## 2017-07-30 DIAGNOSIS — M542 Cervicalgia: Secondary | ICD-10-CM | POA: Diagnosis not present

## 2017-07-30 DIAGNOSIS — M545 Low back pain: Secondary | ICD-10-CM | POA: Diagnosis not present

## 2017-07-30 DIAGNOSIS — E1165 Type 2 diabetes mellitus with hyperglycemia: Secondary | ICD-10-CM | POA: Diagnosis not present

## 2017-07-30 DIAGNOSIS — I10 Essential (primary) hypertension: Secondary | ICD-10-CM | POA: Diagnosis not present

## 2017-07-30 DIAGNOSIS — E6609 Other obesity due to excess calories: Secondary | ICD-10-CM | POA: Diagnosis not present

## 2017-08-03 DIAGNOSIS — M542 Cervicalgia: Secondary | ICD-10-CM | POA: Diagnosis not present

## 2017-08-03 DIAGNOSIS — M79601 Pain in right arm: Secondary | ICD-10-CM | POA: Diagnosis not present

## 2017-08-03 DIAGNOSIS — M79602 Pain in left arm: Secondary | ICD-10-CM | POA: Diagnosis not present

## 2017-08-03 DIAGNOSIS — M6281 Muscle weakness (generalized): Secondary | ICD-10-CM | POA: Diagnosis not present

## 2017-08-06 DIAGNOSIS — E1165 Type 2 diabetes mellitus with hyperglycemia: Secondary | ICD-10-CM | POA: Diagnosis not present

## 2017-08-06 DIAGNOSIS — I10 Essential (primary) hypertension: Secondary | ICD-10-CM | POA: Diagnosis not present

## 2017-08-06 DIAGNOSIS — M542 Cervicalgia: Secondary | ICD-10-CM | POA: Diagnosis not present

## 2017-08-16 DIAGNOSIS — M542 Cervicalgia: Secondary | ICD-10-CM | POA: Diagnosis not present

## 2017-08-16 DIAGNOSIS — Z79899 Other long term (current) drug therapy: Secondary | ICD-10-CM | POA: Diagnosis not present

## 2017-08-16 DIAGNOSIS — M545 Low back pain: Secondary | ICD-10-CM | POA: Diagnosis not present

## 2017-08-16 DIAGNOSIS — G8929 Other chronic pain: Secondary | ICD-10-CM | POA: Diagnosis not present

## 2017-08-20 ENCOUNTER — Ambulatory Visit (INDEPENDENT_AMBULATORY_CARE_PROVIDER_SITE_OTHER): Payer: Medicare HMO | Admitting: Family Medicine

## 2017-08-20 ENCOUNTER — Telehealth: Payer: Self-pay | Admitting: Family Medicine

## 2017-08-20 ENCOUNTER — Ambulatory Visit: Payer: Medicare HMO | Admitting: Family Medicine

## 2017-08-20 ENCOUNTER — Encounter: Payer: Self-pay | Admitting: Family Medicine

## 2017-08-20 VITALS — BP 136/80 | HR 91 | Temp 98.3°F | Ht 61.25 in | Wt 217.0 lb

## 2017-08-20 DIAGNOSIS — I152 Hypertension secondary to endocrine disorders: Secondary | ICD-10-CM

## 2017-08-20 DIAGNOSIS — R222 Localized swelling, mass and lump, trunk: Secondary | ICD-10-CM | POA: Diagnosis not present

## 2017-08-20 DIAGNOSIS — E1159 Type 2 diabetes mellitus with other circulatory complications: Secondary | ICD-10-CM | POA: Diagnosis not present

## 2017-08-20 DIAGNOSIS — I10 Essential (primary) hypertension: Secondary | ICD-10-CM | POA: Diagnosis not present

## 2017-08-20 DIAGNOSIS — E1169 Type 2 diabetes mellitus with other specified complication: Secondary | ICD-10-CM | POA: Insufficient documentation

## 2017-08-20 DIAGNOSIS — K219 Gastro-esophageal reflux disease without esophagitis: Secondary | ICD-10-CM | POA: Insufficient documentation

## 2017-08-20 DIAGNOSIS — F17210 Nicotine dependence, cigarettes, uncomplicated: Secondary | ICD-10-CM

## 2017-08-20 DIAGNOSIS — G8929 Other chronic pain: Secondary | ICD-10-CM

## 2017-08-20 DIAGNOSIS — M542 Cervicalgia: Secondary | ICD-10-CM

## 2017-08-20 DIAGNOSIS — E119 Type 2 diabetes mellitus without complications: Secondary | ICD-10-CM | POA: Insufficient documentation

## 2017-08-20 NOTE — Assessment & Plan Note (Signed)
Slightly above goal. Unclear why patient is only on clonidine 0.4mg  daily. Would benefit from ACE/ARB. Will obtain prior medical records. No changes today.

## 2017-08-20 NOTE — Patient Instructions (Signed)
I put in a referral to the pain clinic.  We will get an ultrasound of your neck.  Come back in 1 month.  Take care,  Dr Jimmey RalphParker

## 2017-08-20 NOTE — Assessment & Plan Note (Signed)
Difficult to appreciate given body habitus. Will obtain ultrasound.

## 2017-08-20 NOTE — Assessment & Plan Note (Addendum)
No red flag signs or symptoms. Patient has follow up with orthopedics in 2 weeks. Informed patient that I did not prescribe narcotics for chronic pain. Patient requested pain clinic referral which was placed today.   Of note, per the database review, patient received hydrocodone-acetaminophen 10-325 #60 four days prior to this appointment. This was not disclosed by the patient during our office visit.

## 2017-08-20 NOTE — Progress Notes (Signed)
Subjective:  Erin Jackson is a 51 y.o. female who presents today with a chief complaint of chronic neck pain and to establish care.   HPI:  Chronic Neck Pain Patient with several year history of chronic neck pain. She has seen orthopedics in the past and underwent a procedure 3 years ago. She is not aware of what the name of the procedure was, but states that they placed a plate "to help hold up the bones." This only minimally helped with her pain. The pain has worsened over the past few years. She has tried several treatments including heating pads, ice, physical therapy, tylenol, and NSAIDs all of which only provide minimal to no relief. She was at one point on percocet twice daily which helped with the pain. Pain is keeping her awake at night. Pain radiates into her arms and fingers. No bowel or bladder incontinence.   Supraclavicular Mass Patient also with several month to years history of mass above her left collarbone. Mass is not painful. Thinks that it is enlarging in size. Reports that her previous PCP told her it was "cosmetic."  Hypertension, Chronic Problem, New problem to this provider BP Readings from Last 3 Encounters:  08/20/17 136/80  05/23/17 (!) 209/114  03/13/17 (!) 149/95   Current Medications: Clonidine 0.4mg  qhs, compliant without side effects.  ROS: Denies any chest pain, shortness of breath, dyspnea on exertion, leg edema.    DIABETES Type II, Chronic, New problem to this provider Medications: metformin 500mg  bid, Reports taking and tolerating without side effects. Blood Sugars per patient: 160s-170s. Unsure of her last A1c.   ROS: Denies Polyuria,Polydipsia, Denies Hypoglycemia symptoms (palpitations, tremors, anxiousness)   GERD Stable on prilosec.   Tobacco Abuse Wants to stop smoking. Currently smokes about a third of a pack per day.  ROS: Per HPI, otherwise a 14 point review of systems was performed and was negative  PMH:  The following were  reviewed and entered/updated in epic: Past Medical History:  Diagnosis Date  . Diabetes mellitus without complication (HCC)   . GERD (gastroesophageal reflux disease)   . Hypertension    Patient Active Problem List   Diagnosis Date Noted  . Chronic neck pain 08/20/2017  . Supraclavicular mass 08/20/2017  . Hypertension associated with diabetes (HCC) 08/20/2017  . T2DM (type 2 diabetes mellitus) (HCC) 08/20/2017  . GERD (gastroesophageal reflux disease) 08/20/2017  . Cigarette nicotine dependence without complication 08/20/2017   Past Surgical History:  Procedure Laterality Date  . ABDOMINAL HYSTERECTOMY    . CERVICAL SPINE SURGERY  2015   has a metal plate  . FOOT SURGERY Left 2014   History reviewed. No pertinent family history.  Medications- reviewed and updated Current Outpatient Prescriptions  Medication Sig Dispense Refill  . alprazolam (XANAX) 2 MG tablet Take 2 mg by mouth 2 (two) times daily as needed for sleep.    . cloNIDine (CATAPRES) 0.2 MG tablet Take 0.4 mg by mouth at bedtime.    . metFORMIN (GLUCOPHAGE) 500 MG tablet Take 500 mg by mouth 2 (two) times daily with a meal.    . omeprazole (PRILOSEC) 20 MG capsule Take 1 capsule (20 mg total) by mouth daily. 30 capsule 0  . traZODone (DESYREL) 150 MG tablet Take 150 mg by mouth at bedtime.    . diphenhydrAMINE (BENADRYL) 25 mg capsule Take 25 mg by mouth every 6 (six) hours as needed for itching.    . famotidine (PEPCID) 20 MG tablet Take 1 tablet (20  mg total) by mouth 2 (two) times daily. (Patient not taking: Reported on 08/20/2017) 10 tablet 0  . fluticasone (FLONASE) 50 MCG/ACT nasal spray Place 2 sprays into both nostrils daily. 16 g 0   No current facility-administered medications for this visit.     Allergies-reviewed and updated Allergies  Allergen Reactions  . Norvasc [Amlodipine] Other (See Comments)    Muscle became very tight  . Aspirin Rash  . Ibuprofen Rash  . Niacin And Related Rash     Social History   Social History  . Marital status: Divorced    Spouse name: N/A  . Number of children: N/A  . Years of education: N/A   Social History Main Topics  . Smoking status: Current Every Day Smoker  . Smokeless tobacco: Never Used     Comment: one pack every 3 days  . Alcohol use Yes     Comment: socially  . Drug use: Yes    Types: Marijuana     Comment: 2 x's a week for pain  . Sexual activity: No   Other Topics Concern  . None   Social History Narrative  . None    Objective:  Physical Exam: BP 136/80 (BP Location: Left Arm, Cuff Size: Large)   Pulse 91   Temp 98.3 F (36.8 C) (Oral)   Ht 5' 1.25" (1.556 m)   Wt 217 lb (98.4 kg)   SpO2 96%   BMI 40.67 kg/m   Gen: NAD, resting comfortably CV: RRR with no murmurs appreciated Pulm: NWOB, CTAB with no crackles, wheezes, or rhonchi GI: Normal bowel sounds present. Soft, Nontender, Nondistended. MSK: Neck diffusely tender to palpation. FROM. Strength 5/5 in upper and lower extremities. Lumbar spine also diffusely tender to palpation. Left supraclavicular fullness appreciated without definitive mass - difficult to appreciate given body habitus.  Skin: warm, dry Neuro: grossly normal, sensation to light touch intact throughout.  Psych: Normal affect and thought content  Assessment/Plan:  Chronic neck pain No red flag signs or symptoms. Patient has follow up with orthopedics in 2 weeks. Informed patient that I did not prescribe narcotics for chronic pain. Patient requested pain clinic referral which was placed today.   Of note, per the database review, patient received hydrocodone-acetaminophen 10-325 #60 four days prior to this appointment. This was not disclosed by the patient during our office visit.   Supraclavicular mass Difficult to appreciate given body habitus. Will obtain ultrasound.   Hypertension associated with diabetes (HCC) Slightly above goal. Unclear why patient is only on clonidine 0.4mg   daily. Would benefit from ACE/ARB. Will obtain prior medical records. No changes today.   T2DM (type 2 diabetes mellitus) (HCC) Patient not aware of prior A1cs. Will obtain prior records. Continue metformin 500mg  bid today. Follow up in 1 month.   GERD (gastroesophageal reflux disease) Continue prilosec.   Cigarette nicotine dependence without complication Smoking cessation strongly encouraged today. Patient is currently contemplative. Consider trial of Chantix in the future. Total time spent counseling approximately 4 minutes.   Katina Degree. Jimmey Ralph, MD 08/20/2017 10:16 AM

## 2017-08-20 NOTE — Assessment & Plan Note (Signed)
Smoking cessation strongly encouraged today. Patient is currently contemplative. Consider trial of Chantix in the future. Total time spent counseling approximately 4 minutes.

## 2017-08-20 NOTE — Assessment & Plan Note (Signed)
Patient not aware of prior A1cs. Will obtain prior records. Continue metformin 500mg  bid today. Follow up in 1 month.

## 2017-08-20 NOTE — Telephone Encounter (Signed)
Called patient to inform [facility] Could not leave message, no vm set up.

## 2017-08-20 NOTE — Assessment & Plan Note (Signed)
Continue prilosec

## 2017-08-23 DIAGNOSIS — E559 Vitamin D deficiency, unspecified: Secondary | ICD-10-CM | POA: Diagnosis not present

## 2017-08-23 DIAGNOSIS — Z79899 Other long term (current) drug therapy: Secondary | ICD-10-CM | POA: Diagnosis not present

## 2017-08-23 DIAGNOSIS — E119 Type 2 diabetes mellitus without complications: Secondary | ICD-10-CM | POA: Diagnosis not present

## 2017-08-23 DIAGNOSIS — Z0000 Encounter for general adult medical examination without abnormal findings: Secondary | ICD-10-CM | POA: Diagnosis not present

## 2017-08-23 DIAGNOSIS — N62 Hypertrophy of breast: Secondary | ICD-10-CM | POA: Diagnosis not present

## 2017-08-25 ENCOUNTER — Ambulatory Visit (HOSPITAL_COMMUNITY): Payer: Medicare HMO | Attending: Family Medicine

## 2017-08-30 DIAGNOSIS — M542 Cervicalgia: Secondary | ICD-10-CM | POA: Diagnosis not present

## 2017-08-30 DIAGNOSIS — M4722 Other spondylosis with radiculopathy, cervical region: Secondary | ICD-10-CM | POA: Diagnosis not present

## 2017-09-03 ENCOUNTER — Telehealth: Payer: Self-pay | Admitting: Family Medicine

## 2017-09-03 NOTE — Telephone Encounter (Signed)
ROI faxed to Dr.Bland & Dr.Dodd

## 2017-09-06 DIAGNOSIS — E7800 Pure hypercholesterolemia, unspecified: Secondary | ICD-10-CM | POA: Diagnosis not present

## 2017-09-06 DIAGNOSIS — R05 Cough: Secondary | ICD-10-CM | POA: Diagnosis not present

## 2017-09-06 DIAGNOSIS — R0602 Shortness of breath: Secondary | ICD-10-CM | POA: Diagnosis not present

## 2017-09-06 DIAGNOSIS — R635 Abnormal weight gain: Secondary | ICD-10-CM | POA: Diagnosis not present

## 2017-09-08 DIAGNOSIS — M4722 Other spondylosis with radiculopathy, cervical region: Secondary | ICD-10-CM | POA: Diagnosis not present

## 2017-09-08 DIAGNOSIS — Z981 Arthrodesis status: Secondary | ICD-10-CM | POA: Diagnosis not present

## 2017-09-09 NOTE — Telephone Encounter (Signed)
Rec'd from Dr.Dodd forwarded 10 pages to Ardith Dark MD

## 2017-09-10 DIAGNOSIS — M4722 Other spondylosis with radiculopathy, cervical region: Secondary | ICD-10-CM | POA: Diagnosis not present

## 2017-09-10 DIAGNOSIS — M545 Low back pain: Secondary | ICD-10-CM | POA: Diagnosis not present

## 2017-09-10 DIAGNOSIS — G8929 Other chronic pain: Secondary | ICD-10-CM | POA: Diagnosis not present

## 2017-09-10 DIAGNOSIS — Z79899 Other long term (current) drug therapy: Secondary | ICD-10-CM | POA: Diagnosis not present

## 2017-09-10 DIAGNOSIS — M5412 Radiculopathy, cervical region: Secondary | ICD-10-CM | POA: Diagnosis not present

## 2017-09-10 DIAGNOSIS — M542 Cervicalgia: Secondary | ICD-10-CM | POA: Diagnosis not present

## 2017-09-16 DIAGNOSIS — M5412 Radiculopathy, cervical region: Secondary | ICD-10-CM | POA: Diagnosis not present

## 2017-09-16 DIAGNOSIS — M4722 Other spondylosis with radiculopathy, cervical region: Secondary | ICD-10-CM | POA: Diagnosis not present

## 2017-09-20 ENCOUNTER — Ambulatory Visit: Payer: Medicare HMO | Admitting: Family Medicine

## 2017-10-08 ENCOUNTER — Encounter (INDEPENDENT_AMBULATORY_CARE_PROVIDER_SITE_OTHER): Payer: Self-pay | Admitting: Neurology

## 2017-10-08 ENCOUNTER — Ambulatory Visit (INDEPENDENT_AMBULATORY_CARE_PROVIDER_SITE_OTHER): Payer: Medicare HMO | Admitting: Neurology

## 2017-10-08 DIAGNOSIS — R52 Pain, unspecified: Secondary | ICD-10-CM

## 2017-10-08 DIAGNOSIS — M542 Cervicalgia: Secondary | ICD-10-CM

## 2017-10-08 DIAGNOSIS — Z0289 Encounter for other administrative examinations: Secondary | ICD-10-CM

## 2017-10-08 NOTE — Procedures (Signed)
Full Name: Erin Jackson Gender: Female MRN #: [id number] Date of Birth: February 28, 2066    Visit Date: 10/08/17 10:10 Age: 51 Years 7 Months Old Examining Physician: Levert Feinstein, MD  Referring Physician: Sharolyn Douglas, MD History: 51 years old female with history of cervical decompression surgery, presented with recurrent neck pain, radiating pain to bilateral shoulder, bilateral upper extremity,  Summary of the test:  Nerve conduction study: Left median sensory response was normal, left median mixed response was 0.5 ms prolonged in comparison to ipsilateral ulnar mixed response. Bilateral ulnar sensory and right ulnar motor responses were normal. Left median motor responses were normal.  Right median sensory responses showed mildly decreased snap amplitude. Right median mixed response was 0.5 milliseconds prolonged comparison to ipsilateral ulnar response. Right ulnar motor responses showed borderline distal latency.  Electromyography: She was not able to tolerate needle examination, I was only able to attend needle examination on right pronator teres, she could not activate the muscle due to pain,   Conclusion: This is an mild abnormal yet incomplete study, there is electrodiagnostic evidence of bilateral median neuropathy across the wrist, consistent with bilateral carpal tunnel syndromes, right side is moderate, left side is mild. She was not able to tolerate needle examinations.    ------------------------------- Physician Name, M.D.  [facility] [address] [place], [place] [address] Tel: [phone] Fax: [phone]        [facility]    Nerve / Sites Muscle Latency Ref. Amplitude Ref. Rel Amp Segments Distance Velocity Ref. Area    ms ms mV mV %  cm m/s m/s mVms  R Median - APB     Wrist APB 4.4 ?4.4 6.5 ?4.0 100 Wrist - APB 7   19.3     Upper arm APB 7.8  6.2  95.4 Upper arm - Wrist 18 54 ?49 18.6  L Median - APB     Wrist APB 3.6 ?4.4 4.8 ?4.0 100  Wrist - APB 7   13.5     Upper arm APB 7.1  5.4  114 Upper arm - Wrist 18 51 ?49 16.1  R Ulnar - ADM     Wrist ADM 2.6 ?3.3 9.6 ?6.0 100 Wrist - ADM 7   21.2     B.Elbow ADM 6.0  8.3  86 B.Elbow - Wrist 18 52 ?49 19.0     A.Elbow ADM 8.4  9.2  111 A.Elbow - B.Elbow 12 50 ?49 20.9         A.Elbow - Wrist               SNC    Nerve / Sites Rec. Site Peak Lat Ref.  Amp Ref. Segments Distance Peak Diff Ref.    ms ms V V  cm ms ms  R Median, Ulnar - Transcarpal comparison     Median Palm Wrist 2.5 ?2.2 9 ?35 Median Palm - Wrist 8       Ulnar Palm Wrist 2.0 ?2.2 11 ?12 Ulnar Palm - Wrist 8          Median Palm - Ulnar Palm  0.5 ?0.4  L Median, Ulnar - Transcarpal comparison     Median Palm Wrist 2.2 ?2.2 22 ?35 Median Palm - Wrist 8       Ulnar Palm Wrist 1.7 ?2.2 5 ?12 Ulnar Palm - Wrist 8          Median Palm - Ulnar Palm  0.5 ?0.4  R Median -  Orthodromic (Dig II, Mid palm)     Dig II Wrist 3.2 ?3.4 5 ?10 Dig II - Wrist 13    L Median - Orthodromic (Dig II, Mid palm)     Dig II Wrist 3.0 ?3.4 10 ?10 Dig II - Wrist 13    R Ulnar - Orthodromic, (Dig V, Mid palm)     Dig V Wrist 2.5 ?3.1 6 ?5 Dig V - Wrist 11    L Ulnar - Orthodromic, (Dig V, Mid palm)     Dig V Wrist 2.6 ?3.1 5 ?5 Dig V - Wrist 2611                   F  Wave    Nerve F Lat Ref.   ms ms  R Ulnar - ADM 29.3 ?32.0       EMG full       EMG Summary Table    Spontaneous MUAP Recruitment  Muscle IA Fib PSW Fasc Other Amp Dur. Poly Pattern  R. Pronator teres Normal None None None _______ Normal Normal Normal Normal

## 2017-10-15 DIAGNOSIS — M542 Cervicalgia: Secondary | ICD-10-CM | POA: Diagnosis not present

## 2017-10-15 DIAGNOSIS — G5603 Carpal tunnel syndrome, bilateral upper limbs: Secondary | ICD-10-CM | POA: Diagnosis not present

## 2017-10-16 DIAGNOSIS — Z0100 Encounter for examination of eyes and vision without abnormal findings: Secondary | ICD-10-CM | POA: Diagnosis not present

## 2017-10-21 ENCOUNTER — Emergency Department (HOSPITAL_COMMUNITY)
Admission: EM | Admit: 2017-10-21 | Discharge: 2017-10-21 | Disposition: A | Discharge status: Home / Self Care | Payer: Medicare HMO | Attending: Emergency Medicine | Admitting: Emergency Medicine

## 2017-10-21 ENCOUNTER — Encounter (HOSPITAL_COMMUNITY): Payer: Self-pay | Admitting: Emergency Medicine

## 2017-10-21 DIAGNOSIS — E119 Type 2 diabetes mellitus without complications: Secondary | ICD-10-CM | POA: Insufficient documentation

## 2017-10-21 DIAGNOSIS — Z79899 Other long term (current) drug therapy: Secondary | ICD-10-CM | POA: Diagnosis not present

## 2017-10-21 DIAGNOSIS — F17210 Nicotine dependence, cigarettes, uncomplicated: Secondary | ICD-10-CM | POA: Insufficient documentation

## 2017-10-21 DIAGNOSIS — M79671 Pain in right foot: Secondary | ICD-10-CM

## 2017-10-21 DIAGNOSIS — Z7984 Long term (current) use of oral hypoglycemic drugs: Secondary | ICD-10-CM | POA: Diagnosis not present

## 2017-10-21 DIAGNOSIS — M25571 Pain in right ankle and joints of right foot: Secondary | ICD-10-CM | POA: Diagnosis not present

## 2017-10-21 DIAGNOSIS — I10 Essential (primary) hypertension: Secondary | ICD-10-CM | POA: Diagnosis not present

## 2017-10-21 MED ORDER — HYDROCODONE-ACETAMINOPHEN 5-325 MG PO TABS
1.0000 | ORAL_TABLET | Freq: Once | ORAL | Status: AC
  Administered 2017-10-21: 1 via ORAL
  Filled 2017-10-21: qty 1

## 2017-10-21 MED ORDER — HYDROCODONE-ACETAMINOPHEN 5-325 MG PO TABS: 2.0000 | ORAL_TABLET | Freq: Four times a day (QID) | ORAL | 0 refills | Status: DC | PRN

## 2017-10-21 NOTE — ED Provider Notes (Signed)
[facility] Provider Note   CSN: [id number] Arrival date & time: 10/21/17  1646     History   Chief Complaint Chief Complaint  Patient presents with  . Foot Pain    HPI Erin Jackson is a 51 y.o. female with hx of HTN and DM presenting with acute onset right foot pain along anterior pad and all toes. Aggravated by putting pressure on that foot. She has taken tylenol and gabapentin without relief. This is new for her. She denies neuropathy in her feet in the past. She have nerve damage in her hands and is currently taking gabapentin for this. She reports that her blood glucose has been well-managed, erythema, fever or edema.  HPI  Past Medical History:  Diagnosis Date  . Diabetes mellitus without complication (HCC)   . GERD (gastroesophageal reflux disease)   . Hypertension     Patient Active Problem List   Diagnosis Date Noted  . Chronic neck pain 08/20/2017  . Supraclavicular mass 08/20/2017  . Hypertension associated with diabetes (HCC) 08/20/2017  . T2DM (type 2 diabetes mellitus) (HCC) 08/20/2017  . GERD (gastroesophageal reflux disease) 08/20/2017  . Cigarette nicotine dependence without complication 08/20/2017    Past Surgical History:  Procedure Laterality Date  . ABDOMINAL HYSTERECTOMY    . CERVICAL SPINE SURGERY  2015   has a metal plate  . FOOT SURGERY Left 2014    OB History    No data available       Home Medications    Prior to Admission medications   Medication Sig Start Date End Date Taking? Authorizing Provider  alprazolam Prudy Feeler(XANAX) 2 MG tablet Take 2 mg by mouth 2 (two) times daily as needed for sleep.    [provider]  cloNIDine (CATAPRES) 0.2 MG tablet Take 0.4 mg by mouth at bedtime.    [provider]  diphenhydrAMINE (BENADRYL) 25 mg capsule Take 25 mg by mouth every 6 (six) hours as needed for itching.    [provider]  famotidine (PEPCID) 20 MG tablet Take 1 tablet (20  mg total) by mouth 2 (two) times daily. Patient not taking: Reported on 08/20/2017 11/01/16   Pricilla LovelessGoldston, Scott, MD  fluticasone [facility](FLONASE) 50 MCG/ACT nasal spray Place 2 sprays into both nostrils daily. 03/13/17   Burgess AmorIdol, Julie, PA-C  HYDROcodone-acetaminophen (NORCO/VICODIN) 5-325 MG tablet Take 2 tablets by mouth every 6 (six) hours as needed for severe pain. 10/21/17   Georgiana ShoreMitchell, Azariya Freeman B, PA-C  metFORMIN (GLUCOPHAGE) 500 MG tablet Take 500 mg by mouth 2 (two) times daily with a meal.    [provider]  omeprazole (PRILOSEC) 20 MG capsule Take 1 capsule (20 mg total) by mouth daily. 11/01/16   Pricilla LovelessGoldston, Scott, MD  traZODone (DESYREL) 150 MG tablet Take 150 mg by mouth at bedtime.    [provider]    Family History No family history on file.  Social History Social History  Substance Use Topics  . Smoking status: Current Every Day Smoker  . Smokeless tobacco: Never Used     Comment: one pack every 3 days  . Alcohol use Yes     Comment: socially     Allergies   Norvasc [amlodipine]; Aspirin; Ibuprofen; and Niacin and related   Review of Systems Review of Systems  Constitutional: Negative for chills and fever.  Respiratory: Negative for shortness of breath.   Cardiovascular: Negative for chest pain.  Gastrointestinal: Negative for nausea and vomiting.  Musculoskeletal: Positive for myalgias.  Negative for arthralgias.  Skin: Negative for color change, pallor and rash.  Neurological: Negative for weakness and numbness.     Physical Exam Updated Vital Signs BP (!) 160/112 (BP Location: Left Arm)   Pulse 89   Temp 98.5 F (36.9 C) (Oral)   Resp 18   Ht 5' 2.5" (1.588 m)   Wt 95.3 kg (210 lb)   SpO2 100%   BMI 37.80 kg/m   Physical Exam  Constitutional: She appears well-developed and well-nourished. No distress.  Afebrile, nontoxic-appearing, sitting comfortably in chair in no acute distress.  Eyes: EOM are normal.  Neck: Normal range of motion. Neck  supple.  Cardiovascular: Normal rate, regular rhythm, normal heart sounds and intact distal pulses.   No murmur heard. Pulmonary/Chest: Effort normal and breath sounds normal. No respiratory distress. She has no wheezes. She has no rales.  Abdominal: She exhibits no distension.  Musculoskeletal: Normal range of motion. She exhibits tenderness. She exhibits no edema or deformity.  Full range of motion of the foot and toes. Achilles tendon intact. No tenderness palpation of the calcaneal bone, negative anterior drawer. Tenderness palpation of the anterior foot pad and all toes. No focal swelling, erythema, tenderness or warmth. Strong dorsalis pedis pulses.  Neurological: She is alert. No sensory deficit. She exhibits normal muscle tone.  5 out of 5 strength to flexion and dorsiflexion, and flexion and extension of the toes  Skin: Skin is warm and dry. Capillary refill takes less than 2 seconds. No rash noted. She is not diaphoretic. No erythema. No pallor.  Skin is intact between all toes. No evidence of point of entry for infectious process  Psychiatric: She has a normal mood and affect.  Nursing note and vitals reviewed.    ED Treatments / Results  Labs (all labs ordered are listed, but only abnormal results are displayed) Labs Reviewed - No data to display  EKG  EKG Interpretation None       Radiology No results found.  Procedures Procedures (including critical care time)  Medications Ordered in ED Medications  HYDROcodone-acetaminophen (NORCO/VICODIN) 5-325 MG per tablet 1 tablet (1 tablet Oral Given 10/21/17 1908)     Initial Impression / Assessment and Plan / ED Course  I have reviewed the triage vital signs and the nursing notes.  Pertinent labs & imaging results that were available during my care of the patient were reviewed by me and considered in my medical decision making (see chart for details).    Patient presenting with acute onset right foot pain involving  all toes and foot pad. No injury or trauma. Exam unconcerning for gout or infectious process.  Patient has full range of motion, no erythema, warmth, swelling. Suspect possible neuropathic pain.  Patient's pain was managed and advised to follow up with primary care. Dc home with symptomatic relief and f/u.  She was given strict return precautions if symptoms worsen or new concerning symptoms. She understood and agreed with discharge plan.  Final Clinical Impressions(s) / ED Diagnoses   Final diagnoses:  Foot pain, right    New Prescriptions Discharge Medication List as of 10/21/2017  7:01 PM    START taking these medications   Details  HYDROcodone-acetaminophen (NORCO/VICODIN) 5-325 MG tablet Take 2 tablets by mouth every 6 (six) hours as needed for severe pain., Starting Thu 10/21/2017, Print         Georgiana Shore, PA-C 10/21/17 1929    Bethann Berkshire, MD 10/22/17 [phone]

## 2017-10-21 NOTE — Discharge Instructions (Signed)
As discussed, wear good support shoes and socks. Follow up with your primary care provider if symptoms persist.  Return sooner if swelling, worsening pain, loss of sensation, redness, warmth to the touch, fever, or other new concerning symptoms in the meantime

## 2017-10-21 NOTE — ED Triage Notes (Signed)
Pt c/o pain in bottom of right foot x's 4 days.  Also c/o bil hand pain, st's she has carpal tunnel    No known injury

## 2017-10-28 DIAGNOSIS — Z716 Tobacco abuse counseling: Secondary | ICD-10-CM | POA: Diagnosis not present

## 2017-10-28 DIAGNOSIS — E6609 Other obesity due to excess calories: Secondary | ICD-10-CM | POA: Diagnosis not present

## 2017-10-28 DIAGNOSIS — E1165 Type 2 diabetes mellitus with hyperglycemia: Secondary | ICD-10-CM | POA: Diagnosis not present

## 2017-10-28 DIAGNOSIS — I10 Essential (primary) hypertension: Secondary | ICD-10-CM | POA: Diagnosis not present

## 2017-10-28 DIAGNOSIS — G5603 Carpal tunnel syndrome, bilateral upper limbs: Secondary | ICD-10-CM | POA: Diagnosis not present

## 2017-11-03 DIAGNOSIS — M47816 Spondylosis without myelopathy or radiculopathy, lumbar region: Secondary | ICD-10-CM | POA: Diagnosis not present

## 2017-11-03 DIAGNOSIS — M5431 Sciatica, right side: Secondary | ICD-10-CM | POA: Diagnosis not present

## 2017-11-04 DIAGNOSIS — G5603 Carpal tunnel syndrome, bilateral upper limbs: Secondary | ICD-10-CM | POA: Diagnosis not present

## 2017-11-04 DIAGNOSIS — M180 Bilateral primary osteoarthritis of first carpometacarpal joints: Secondary | ICD-10-CM | POA: Insufficient documentation

## 2017-11-08 ENCOUNTER — Other Ambulatory Visit: Payer: Self-pay | Admitting: Orthopedic Surgery

## 2017-11-08 DIAGNOSIS — G8929 Other chronic pain: Secondary | ICD-10-CM

## 2017-11-08 DIAGNOSIS — M545 Low back pain: Principal | ICD-10-CM

## 2017-11-08 DIAGNOSIS — M47816 Spondylosis without myelopathy or radiculopathy, lumbar region: Secondary | ICD-10-CM | POA: Diagnosis not present

## 2017-11-19 DIAGNOSIS — E6609 Other obesity due to excess calories: Secondary | ICD-10-CM | POA: Diagnosis not present

## 2017-11-19 DIAGNOSIS — E1165 Type 2 diabetes mellitus with hyperglycemia: Secondary | ICD-10-CM | POA: Diagnosis not present

## 2017-11-20 ENCOUNTER — Other Ambulatory Visit: Payer: Self-pay | Admitting: Orthopedic Surgery

## 2017-11-20 ENCOUNTER — Ambulatory Visit
Admission: RE | Admit: 2017-11-20 | Discharge: 2017-11-20 | Disposition: A | Discharge status: Home / Self Care | Payer: Medicare HMO | Source: Ambulatory Visit | Attending: Orthopedic Surgery | Admitting: Orthopedic Surgery

## 2017-11-20 ENCOUNTER — Inpatient Hospital Stay
Admission: RE | Admit: 2017-11-20 | Discharge: 2017-11-20 | Disposition: A | Discharge status: Home / Self Care | Payer: Medicare HMO | Source: Ambulatory Visit | Attending: Orthopedic Surgery | Admitting: Orthopedic Surgery

## 2017-11-20 DIAGNOSIS — G8929 Other chronic pain: Secondary | ICD-10-CM

## 2017-11-20 DIAGNOSIS — M545 Low back pain: Principal | ICD-10-CM

## 2017-11-25 ENCOUNTER — Ambulatory Visit
Admission: RE | Admit: 2017-11-25 | Discharge: 2017-11-25 | Disposition: A | Discharge status: Home / Self Care | Payer: Medicare HMO | Source: Ambulatory Visit | Attending: Orthopedic Surgery | Admitting: Orthopedic Surgery

## 2017-11-25 DIAGNOSIS — G8929 Other chronic pain: Secondary | ICD-10-CM

## 2017-11-25 DIAGNOSIS — M545 Low back pain: Principal | ICD-10-CM

## 2017-11-25 DIAGNOSIS — M48061 Spinal stenosis, lumbar region without neurogenic claudication: Secondary | ICD-10-CM | POA: Diagnosis not present

## 2017-12-03 DIAGNOSIS — M5431 Sciatica, right side: Secondary | ICD-10-CM | POA: Diagnosis not present

## 2017-12-03 DIAGNOSIS — M4722 Other spondylosis with radiculopathy, cervical region: Secondary | ICD-10-CM | POA: Diagnosis not present

## 2017-12-03 DIAGNOSIS — M4726 Other spondylosis with radiculopathy, lumbar region: Secondary | ICD-10-CM | POA: Diagnosis not present

## 2017-12-24 DIAGNOSIS — M1812 Unilateral primary osteoarthritis of first carpometacarpal joint, left hand: Secondary | ICD-10-CM | POA: Diagnosis not present

## 2017-12-24 DIAGNOSIS — M654 Radial styloid tenosynovitis [de Quervain]: Secondary | ICD-10-CM | POA: Diagnosis not present

## 2017-12-24 DIAGNOSIS — G5602 Carpal tunnel syndrome, left upper limb: Secondary | ICD-10-CM | POA: Diagnosis not present

## 2017-12-24 DIAGNOSIS — G8918 Other acute postprocedural pain: Secondary | ICD-10-CM | POA: Diagnosis not present

## 2017-12-25 ENCOUNTER — Emergency Department (HOSPITAL_COMMUNITY)
Admission: EM | Admit: 2017-12-25 | Discharge: 2017-12-25 | Disposition: A | Discharge status: Home / Self Care | Payer: Medicare HMO | Attending: Emergency Medicine | Admitting: Emergency Medicine

## 2017-12-25 ENCOUNTER — Encounter (HOSPITAL_COMMUNITY): Payer: Self-pay | Admitting: *Deleted

## 2017-12-25 ENCOUNTER — Other Ambulatory Visit: Payer: Self-pay

## 2017-12-25 DIAGNOSIS — E119 Type 2 diabetes mellitus without complications: Secondary | ICD-10-CM | POA: Diagnosis not present

## 2017-12-25 DIAGNOSIS — M79632 Pain in left forearm: Secondary | ICD-10-CM | POA: Insufficient documentation

## 2017-12-25 DIAGNOSIS — G8918 Other acute postprocedural pain: Secondary | ICD-10-CM | POA: Diagnosis not present

## 2017-12-25 DIAGNOSIS — F17210 Nicotine dependence, cigarettes, uncomplicated: Secondary | ICD-10-CM | POA: Insufficient documentation

## 2017-12-25 DIAGNOSIS — Z79899 Other long term (current) drug therapy: Secondary | ICD-10-CM | POA: Diagnosis not present

## 2017-12-25 DIAGNOSIS — I10 Essential (primary) hypertension: Secondary | ICD-10-CM | POA: Diagnosis not present

## 2017-12-25 LAB — BASIC METABOLIC PANEL
Anion gap: 16 — ABNORMAL HIGH (ref 5–15)
Anion gap: 7 (ref 5–15)
BUN: 8 mg/dL (ref 6–20)
BUN: 5 mg/dL — ABNORMAL LOW (ref 6–20)
CO2: 29 mmol/L (ref 22–32)
CO2: 26 mmol/L (ref 22–32)
Calcium: 9.5 mg/dL (ref 8.9–10.3)
Calcium: 8.5 mg/dL — ABNORMAL LOW (ref 8.9–10.3)
Chloride: 105 mmol/L (ref 101–111)
Chloride: 102 mmol/L (ref 101–111)
Creatinine, Ser: 0.82 mg/dL (ref 0.44–1.00)
Creatinine, Ser: 0.74 mg/dL (ref 0.44–1.00)
GFR calc Af Amer: >60 mL/min (ref >60)
GFR calc Af Amer: >60 mL/min (ref >60)
GFR calc non Af Amer: >60 mL/min (ref >60)
GFR calc non Af Amer: >60 mL/min (ref >60)
Glucose, Bld: 208 mg/dL — ABNORMAL HIGH (ref 65–99)
Glucose, Bld: 181 mg/dL — ABNORMAL HIGH (ref 65–99)
Potassium: 5.3 mmol/L — ABNORMAL HIGH (ref 3.5–5.1)
Potassium: 3.5 mmol/L (ref 3.5–5.1)
Sodium: 150 mmol/L — ABNORMAL HIGH (ref 135–145)
Sodium: 135 mmol/L (ref 135–145)

## 2017-12-25 LAB — CBC WITH DIFFERENTIAL/PLATELET
Basophils Absolute: 0 10*3/uL (ref 0.0–0.1)
Basophils Relative: 0 %
Eosinophils Absolute: 0.1 10*3/uL (ref 0.0–0.7)
Eosinophils Relative: 1 %
HCT: 36.8 % (ref 36.0–46.0)
Hemoglobin: 11.8 g/dL — ABNORMAL LOW (ref 12.0–15.0)
Lymphocytes Relative: 42 %
Lymphs Abs: 4 10*3/uL (ref 0.7–4.0)
MCH: 29.3 pg (ref 26.0–34.0)
MCHC: 32.1 g/dL (ref 30.0–36.0)
MCV: 91.3 fL (ref 78.0–100.0)
Monocytes Absolute: 0.7 10*3/uL (ref 0.1–1.0)
Monocytes Relative: 7 %
Neutro Abs: 4.8 10*3/uL (ref 1.7–7.7)
Neutrophils Relative %: 50 %
Platelets: 226 10*3/uL (ref 150–400)
RBC: 4.03 MIL/uL (ref 3.87–5.11)
RDW: 13.8 % (ref 11.5–15.5)
WBC: 9.6 10*3/uL (ref 4.0–10.5)

## 2017-12-25 MED ORDER — SODIUM CHLORIDE 0.9 % IV BOLUS (SEPSIS)
1000.0000 mL | Freq: Once | INTRAVENOUS | Status: AC
  Administered 2017-12-25: 1000 mL via INTRAVENOUS

## 2017-12-25 MED ORDER — METHOCARBAMOL 1000 MG/10ML IJ SOLN
500.0000 mg | Freq: Once | INTRAVENOUS | Status: AC
  Administered 2017-12-25: 500 mg via INTRAVENOUS
  Filled 2017-12-25: qty 550

## 2017-12-25 MED ORDER — GABAPENTIN 100 MG PO CAPS: 100.0000 mg | ORAL_CAPSULE | Freq: Three times a day (TID) | ORAL | 0 refills | Status: DC

## 2017-12-25 MED ORDER — HYDROMORPHONE HCL 2 MG PO TABS
2.0000 mg | ORAL_TABLET | Freq: Once | ORAL | Status: AC
  Administered 2017-12-25: 2 mg via ORAL
  Filled 2017-12-25: qty 1

## 2017-12-25 MED ORDER — PREGABALIN 100 MG PO CAPS
100.0000 mg | ORAL_CAPSULE | Freq: Once | ORAL | Status: AC
  Administered 2017-12-25: 100 mg via ORAL
  Filled 2017-12-25: qty 1

## 2017-12-25 MED ORDER — HYDROMORPHONE HCL 1 MG/ML IJ SOLN
1.0000 mg | Freq: Once | INTRAMUSCULAR | Status: AC
  Administered 2017-12-25: 1 mg via INTRAVENOUS
  Filled 2017-12-25: qty 1

## 2017-12-25 MED ORDER — METHOCARBAMOL 1000 MG/10ML IJ SOLN
500.0000 mg | Freq: Once | INTRAMUSCULAR | Status: DC
  Filled 2017-12-25: qty 5

## 2017-12-25 MED ORDER — HYDROMORPHONE HCL 2 MG PO TABS: 1.0000 mg | ORAL_TABLET | ORAL | 0 refills | Status: DC | PRN

## 2017-12-25 NOTE — ED Triage Notes (Signed)
Pt had surgery yesterday to left arm. Pt reports severe pain all night. Is very tearful at triage and does not want me to unwrap her arm. States she called the on call dr and they told her to remove bandage which she did and it did not help relieve the pressure. Has been taking percocet with no relief.

## 2017-12-25 NOTE — Discharge Instructions (Signed)
The medication I have prescribed you is stronger than the Percocet.  Try taking 1/2 tablet for moderate pain, and one full tablet for severe pain, every 4-6 hours as needed.  Be careful to take this with food, and do not drive or operate any heavy machinery while taking narcotic medications.  The hydromorphone is similar to the Percocet/oxycodone.  Do not mix these 2 medications.  Take 1 or the other.

## 2017-12-25 NOTE — ED Notes (Signed)
MD aware of pts BP, pt reports she takes BP at night.

## 2017-12-25 NOTE — Progress Notes (Signed)
Orthopedic Tech Progress Note Patient Details:  Erin IhaSharon Jackson 1966-07-17 [id number]  Ortho Devices Type of Ortho Device: Ace wrap, Volar splint, Sling arm elevator Ortho Device/Splint Location: lue Ortho Device/Splint Interventions: Application   Post Interventions Patient Tolerated: Well Instructions Provided: Care of device   Erin DomCrawford, Erin Jackson 12/25/2017, 12:57 PM

## 2017-12-25 NOTE — ED Notes (Signed)
Pt ambulatory to restroom

## 2017-12-26 NOTE — ED Provider Notes (Addendum)
[facility] EMERGENCY DEPARTMENT Provider Note   CSN: [id number] Arrival date & time: 12/25/17  1013     History   Chief Complaint Chief Complaint  Patient presents with  . Post-op Problem    HPI Erin Jackson is a 52 y.o. female.  HPI   52 yo F s/p recent carpal tunnel surgery yesterday here with ongoing arm/hand pain. Pt reports she felt fine after surgery yesterday but once her nerve block wore off, shes had throbbing, aching, severe pain. She describes it as the worst pain of her life. Denies any distal numbness or weakness. She has been taking oxycodone w/o relief. She called Dr. Ronie Spies office yesterday and was told to loosen her splint, which she did without significant improvement. No alleviating factors.  Past Medical History:  Diagnosis Date  . Diabetes mellitus without complication (HCC)   . GERD (gastroesophageal reflux disease)   . Hypertension     Patient Active Problem List   Diagnosis Date Noted  . Chronic neck pain 08/20/2017  . Supraclavicular mass 08/20/2017  . Hypertension associated with diabetes (HCC) 08/20/2017  . T2DM (type 2 diabetes mellitus) (HCC) 08/20/2017  . GERD (gastroesophageal reflux disease) 08/20/2017  . Cigarette nicotine dependence without complication 08/20/2017    Past Surgical History:  Procedure Laterality Date  . ABDOMINAL HYSTERECTOMY    . CERVICAL SPINE SURGERY  2015   has a metal plate  . FOOT SURGERY Left 2014    OB History    No data available       Home Medications    Prior to Admission medications   Medication Sig Start Date End Date Taking? Authorizing Provider  alprazolam Prudy Feeler) 2 MG tablet Take 2 mg by mouth 2 (two) times daily as needed for sleep.   Yes [provider]  cloNIDine (CATAPRES) 0.2 MG tablet Take 0.4 mg by mouth at bedtime.   Yes [provider]  diphenhydrAMINE (BENADRYL) 25 mg capsule Take 25 mg by mouth every 6 (six) hours as needed for itching.   Yes  [provider]  EPINEPHrine 0.3 mg/0.3 mL IJ SOAJ injection Inject 0.3 mg into the muscle once as needed (allergic reaction).   Yes [provider]  famotidine (PEPCID) 20 MG tablet Take 1 tablet (20 mg total) by mouth 2 (two) times daily. Patient taking differently: Take 20 mg by mouth at bedtime.  11/01/16  Yes Pricilla Loveless, MD  fluticasone (FLONASE) 50 MCG/ACT nasal spray Place 2 sprays into both nostrils daily. Patient taking differently: Place 2 sprays into both nostrils daily as needed for allergies.  03/13/17  Yes Idol, Raynelle Fanning, PA-C  JARDIANCE 10 MG TABS tablet Take 10 mg by mouth at bedtime. 12/15/17  Yes [provider]  metFORMIN (GLUCOPHAGE) 500 MG tablet Take 500 mg by mouth 2 (two) times daily with a meal.   Yes [provider]  omeprazole (PRILOSEC) 20 MG capsule Take 1 capsule (20 mg total) by mouth daily. 11/01/16  Yes Pricilla Loveless, MD  oxyCODONE-acetaminophen (PERCOCET/ROXICET) 5-325 MG tablet Take 1 tablet by mouth every 6 (six) hours as needed for moderate pain or severe pain.   Yes [provider]  traZODone (DESYREL) 150 MG tablet Take 150 mg by mouth at bedtime.   Yes [provider]  gabapentin (NEURONTIN) 100 MG capsule Take 1 capsule (100 mg total) by mouth 3 (three) times daily for 3 days. 12/25/17 12/28/17  Shaune Pollack, MD  HYDROcodone-acetaminophen (NORCO/VICODIN) 5-325 MG tablet Take 2 tablets by  mouth every 6 (six) hours as needed for severe pain. Patient not taking: Reported on 12/25/2017 10/21/17   Mathews Robinsons B, PA-C  HYDROmorphone (DILAUDID) 2 MG tablet Take 0.5-1 tablets (1-2 mg total) by mouth every 4 (four) hours as needed for moderate pain or severe pain (Take one half tablet for moderate pain, one full tablet for severe pain). 12/25/17   Shaune Pollack, MD    Family History History reviewed. No pertinent family history.  Social History Social History   Tobacco Use  . Smoking status: Current  Every Day Smoker  . Smokeless tobacco: Never Used  . Tobacco comment: one pack every 3 days  Substance Use Topics  . Alcohol use: Yes    Comment: socially  . Drug use: Yes    Types: Marijuana    Comment: 2 x's a week for pain     Allergies   Citrus; Norvasc [amlodipine]; Other; Aspirin; Ibuprofen; and Niacin and related   Review of Systems Review of Systems  Constitutional: Negative for chills and fever.  HENT: Negative for congestion, rhinorrhea and sore throat.   Eyes: Negative for visual disturbance.  Respiratory: Negative for cough, shortness of breath and wheezing.   Cardiovascular: Negative for chest pain and leg swelling.  Gastrointestinal: Negative for abdominal pain, diarrhea, nausea and vomiting.  Genitourinary: Negative for dysuria, flank pain, vaginal bleeding and vaginal discharge.  Musculoskeletal: Positive for arthralgias and joint swelling. Negative for neck pain.  Skin: Negative for rash.  Allergic/Immunologic: Negative for immunocompromised state.  Neurological: Negative for syncope and headaches.  Hematological: Does not bruise/bleed easily.  All other systems reviewed and are negative.    Physical Exam Updated Vital Signs BP (!) 179/122   Pulse (!) 103   Temp 98.9 F (37.2 C) (Oral)   Resp 20   SpO2 100%   Physical Exam  Constitutional: She is oriented to person, place, and time. She appears well-developed and well-nourished. No distress.  HENT:  Head: Normocephalic and atraumatic.  Eyes: Conjunctivae are normal.  Neck: Neck supple.  Cardiovascular: Normal rate, regular rhythm and normal heart sounds.  Pulmonary/Chest: Effort normal. No respiratory distress. She has no wheezes.  Abdominal: She exhibits no distension.  Musculoskeletal: She exhibits no edema.  Neurological: She is alert and oriented to person, place, and time. She exhibits normal muscle tone.  Skin: Skin is warm. Capillary refill takes less than 2 seconds. No rash noted.    Nursing note and vitals reviewed.   UPPER EXTREMITY EXAM: LEFT  INSPECTION & PALPATION: Hand with moderate dorsal swelling but compartments soft. Incision sites well-dressed and appear c/d/i without dehiscence, bleeding, or purulence. There is marked diffuse TTP to palpation. She is able to flex/extend all digits on command though limited 2/2 pain.  SENSORY: Sensation is intact to light touch in:  Superficial radial nerve distribution (dorsal first web space) Median nerve distribution (tip of index finger)   Ulnar nerve distribution (tip of small finger)     MOTOR:  + Motor posterior interosseous nerve (thumb IP extension) + Anterior interosseous nerve (thumb IP flexion, index finger DIP flexion) + Radial nerve (wrist extension) + Median nerve (palpable firing thenar mass) + Ulnar nerve (palpable firing of first dorsal interosseous muscle)  VASCULAR: 2+ radial pulse Brisk capillary refill < 2 sec, fingers warm and well-perfused  COMPARTMENTS: Soft, warm, well-perfused No pain with passive extension No paresthesias    ED Treatments / Results  Labs (all labs ordered are listed, but only abnormal results are displayed) Labs  Reviewed  BASIC METABOLIC PANEL - Abnormal; Notable for the following components:      Result Value   Sodium 150 (*)    Potassium 5.3 (*)    Glucose, Bld 208 (*)    Anion gap 16 (*)    All other components within normal limits  BASIC METABOLIC PANEL - Abnormal; Notable for the following components:   Glucose, Bld 181 (*)    BUN 5 (*)    Calcium 8.5 (*)    All other components within normal limits  CBC WITH DIFFERENTIAL/PLATELET - Abnormal; Notable for the following components:   Hemoglobin 11.8 (*)    All other components within normal limits  CBC WITH DIFFERENTIAL/PLATELET    EKG  EKG Interpretation None       Radiology No results found.  Procedures Procedures (including critical care time)  Medications Ordered in ED Medications   HYDROmorphone (DILAUDID) injection 1 mg (1 mg Intravenous Given 12/25/17 1138)  pregabalin (LYRICA) capsule 100 mg (100 mg Oral Given 12/25/17 1255)  HYDROmorphone (DILAUDID) injection 1 mg (1 mg Intravenous Given 12/25/17 1224)  methocarbamol (ROBAXIN) 500 mg in dextrose 5 % 50 mL IVPB (0 mg Intravenous Stopped 12/25/17 1343)  sodium chloride 0.9 % bolus 1,000 mL (0 mLs Intravenous Stopped 12/25/17 1511)  HYDROmorphone (DILAUDID) tablet 2 mg (2 mg Oral Given 12/25/17 1512)     Initial Impression / Assessment and Plan / ED Course  I have reviewed the triage vital signs and the nursing notes.  Pertinent labs & imaging results that were available during my care of the patient were reviewed by me and considered in my medical decision making (see chart for details).     52 yo F here with severe hand pain s/p surgery yesterday. On exam, pt has expected post-op swelling but compartments are very soft and she has no distal numbness, tingling, or signs of developing compartment syndrome. No erythema and time course is not c/w infection. Wounds are intact. Discussed with Dr. Merlyn LotKuzma. Pt placed in a new, looser splint and arm elevated. IV Dilaudid given with lyrica as well.   Following these tx, pain improved to 5/10. She remains distally nVI. I suspect she has expected, but severe, post-operative pain without apparent complication. Dr. Merlyn LotKuzma is in agreement. Per Dr. Merlyn LotKuzma, will give a brief, 2 day course of PO dilaudid, gabapentin (lyrica will not be covered by insurance), and she will f/u with Dr. Mina MarbleWeingold on Monday. I discussed that she should NOT take oxycodone along with the dilaudid and we discussed appropriate course of post-op pain. I'll give her an arm elevator and encourage elevation, ice.  Of note, pt HTN and tachycardic here - likely 2/2 pain and not takng her AM meds. No HA, CP, or signs of HTN urgency. Improved with analgesia.  Final Clinical Impressions(s) / ED Diagnoses   Final diagnoses:   Post-operative pain    ED Discharge Orders        Ordered    HYDROmorphone (DILAUDID) 2 MG tablet  Every 4 hours PRN     12/25/17 1505    gabapentin (NEURONTIN) 100 MG capsule  3 times daily     12/25/17 1505       Shaune PollackIsaacs, Jae Skeet, MD 12/26/17 [id number]    Shaune PollackIsaacs, Marrisa Kimber, MD 12/26/17 [phone]

## 2017-12-28 DIAGNOSIS — M180 Bilateral primary osteoarthritis of first carpometacarpal joints: Secondary | ICD-10-CM | POA: Diagnosis not present

## 2017-12-28 DIAGNOSIS — G5603 Carpal tunnel syndrome, bilateral upper limbs: Secondary | ICD-10-CM | POA: Diagnosis not present

## 2017-12-28 DIAGNOSIS — M79645 Pain in left finger(s): Secondary | ICD-10-CM | POA: Diagnosis not present

## 2017-12-28 DIAGNOSIS — M79642 Pain in left hand: Secondary | ICD-10-CM | POA: Diagnosis not present

## 2018-01-14 ENCOUNTER — Encounter: Payer: Self-pay | Admitting: Family Medicine

## 2018-01-18 ENCOUNTER — Ambulatory Visit (INDEPENDENT_AMBULATORY_CARE_PROVIDER_SITE_OTHER): Payer: Medicare HMO | Admitting: Orthopaedic Surgery

## 2018-01-26 ENCOUNTER — Ambulatory Visit (INDEPENDENT_AMBULATORY_CARE_PROVIDER_SITE_OTHER): Payer: Medicare HMO | Admitting: Orthopaedic Surgery

## 2018-01-26 ENCOUNTER — Encounter (INDEPENDENT_AMBULATORY_CARE_PROVIDER_SITE_OTHER): Payer: Self-pay | Admitting: Orthopaedic Surgery

## 2018-01-26 VITALS — Ht 62.5 in | Wt 209.0 lb

## 2018-01-26 DIAGNOSIS — M5136 Other intervertebral disc degeneration, lumbar region: Secondary | ICD-10-CM | POA: Diagnosis not present

## 2018-01-26 NOTE — Progress Notes (Signed)
Office Visit Note   Patient: Erin Jackson           Date of Birth: 1966/03/12           MRN: [id number] Visit Date: 01/26/2018              Requested by: Renaye RakersBland, Veita, MD [address] [place], [place] [id number] PCP: Ardith DarkParker, Caleb M, MD   Assessment & Plan: Visit Diagnoses:  1. Other intervertebral disc degeneration, lumbar region     Plan: We reviewed her MRI scan images I gave her a copy of the report from her scan in December 2018.  Disc bulge on the right.  Mild facet hypertrophy at L4-5 and L5-S1.  Mild disc bulge at L4-5 without significant compression.  She does not have any significant central or lateral recess stenosis.  We reviewed the images I discussed with her she needs to work on weight loss and core strengthening her back.  Carotic for the problem that he is having.  Patient states the Percocet 10 did help her pain we discussed the importance narcotics health maintenance activities.  Discussed working out some of the gym when she is interested in discussed core strengthening to help or support her back which shows some mild facet degeneration mild disc degeneration.  She likely had some increased disc bulge she has significant increased pain and lumbar spasms but this has resolved and her mobility has improved.  Tests are indicated and she is not having radicular symptoms.  Work hard on her weight loss in office follow-up PRN.  Patient has had recent [facility] surgery of the left hand and also carpal tunnel  Follow-Up Instructions: Return if symptoms worsen or fail to improve.   Orders:  No orders of the defined types were placed in this encounter.  No orders of the defined types were placed in this encounter.     Procedures: No procedures performed   Clinical Data: No additional findings.   Subjective: Chief Complaint  Patient presents with  . Lower Back - Pain    HPI 52 year old female seen with low back pain present for more than a year.  Occasions including  tramadol, hydrocodone, Percocet gabapentin prednisone.  She has had previous MRI.  States she is had chronic back pain she had an episode where she is unable to get out of bed for a day sharp pain and spasms and cried in the bed due to the pain.  She does have diabetes is on metformin also hypertension GERD smoker is also had some chronic neck pain.  Patient has not worked since  2006.  States she is allergic to anti-inflammatories.  No associated bowel or bladder symptoms  Review of Systems positive for hypertension GERD, nicotine dependence smoker, chronic neck pain.  Positive for obesity..   Objective: Vital Signs: Ht 5' 2.5" (1.588 m)   Wt 209 lb (94.8 kg)   BMI 37.62 kg/m   Physical Exam  Constitutional: She is oriented to person, place, and time. She appears well-developed.  HENT:  Head: Normocephalic.  Right Ear: External ear normal.  Left Ear: External ear normal.  Eyes: Pupils are equal, round, and reactive to light.  Neck: No tracheal deviation present. No thyromegaly present.  Cardiovascular: Normal rate.  Pulmonary/Chest: Effort normal.  Abdominal: Soft.  Neurological: She is alert and oriented to person, place, and time.  Skin: Skin is warm and dry.  Psychiatric: She has a normal mood and affect. Her behavior is normal.  Ortho Exam patient is able ambulate weakness.  Lower extremity reflexes are intact negative logroll the hips knees reach full extension.  Trochanter sciatic notch palpation over the lumbar spine shows tenderness..  No rash over exposed skin no lymphadenopathy no venous engorgement.  Specialty Comments:  No specialty comments available.  Imaging:CLINICAL DATA:  Low back pain extending down the right leg, 6 months duration.  EXAM: MRI LUMBAR SPINE WITHOUT CONTRAST  TECHNIQUE: Multiplanar, multisequence MR imaging of the lumbar spine was performed. No intravenous contrast was administered.  COMPARISON:  None.  FINDINGS: Segmentation:  5  lumbar type vertebral bodies.  Alignment:  Normal  Vertebrae:  Normal  Conus medullaris and cauda equina: Conus extends to the T12-L1 level. Conus and cauda equina appear normal.  Paraspinal and other soft tissues: Normal  Disc levels:  T11-12 and T12-L1 are normal.  L1-2:  Minimal disc bulge.  No stenosis.  L2-3:  Normal.  L3-4: Mild disc bulge. Small right foraminal to extraforaminal protrusion adjacent to the right L3 nerve that could focally irritate the structure.  L4-5:  Mild disc bulge.  Mild facet osteoarthritis.  No stenosis.  L5-S1: Normal appearance of the disc. Facet osteoarthritis right worse than left. No stenosis.  IMPRESSION: In this patient with right leg pain, the most suspicious finding is at L3-4. There is a disc bulge with slight focal prominence in the right foraminal to extraforaminal region that could focally irritate the right L3 nerve. Certainly, distinct nerve compression is not demonstrated.  L4-5 disc bulge and mild facet hypertrophy. No stenosis seen at that level.  L5-S1 facet osteoarthritis right worse than left without stenosis.   Electronically Signed   By: Paulina Fusi M.D.   On: 11/25/2017 08:37    PMFS History: Patient Active Problem List   Diagnosis Date Noted  . Chronic neck pain 08/20/2017  . Supraclavicular mass 08/20/2017  . Hypertension associated with diabetes (HCC) 08/20/2017  . T2DM (type 2 diabetes mellitus) (HCC) 08/20/2017  . GERD (gastroesophageal reflux disease) 08/20/2017  . Cigarette nicotine dependence without complication 08/20/2017   Past Medical History:  Diagnosis Date  . Diabetes mellitus without complication (HCC)   . GERD (gastroesophageal reflux disease)   . Hypertension     No family history on file.  Past Surgical History:  Procedure Laterality Date  . ABDOMINAL HYSTERECTOMY    . CERVICAL SPINE SURGERY  2015   has a metal plate  . FOOT SURGERY Left 2014   Social  History   Occupational History  . Not on file  Tobacco Use  . Smoking status: Current Every Day Smoker  . Smokeless tobacco: Never Used  . Tobacco comment: one pack every 3 days  Substance and Sexual Activity  . Alcohol use: Yes    Comment: socially  . Drug use: Yes    Types: Marijuana    Comment: 2 x's a week for pain  . Sexual activity: No

## 2018-02-25 DIAGNOSIS — F319 Bipolar disorder, unspecified: Secondary | ICD-10-CM | POA: Diagnosis not present

## 2018-02-25 DIAGNOSIS — I10 Essential (primary) hypertension: Secondary | ICD-10-CM | POA: Diagnosis not present

## 2018-02-25 DIAGNOSIS — E1165 Type 2 diabetes mellitus with hyperglycemia: Secondary | ICD-10-CM | POA: Diagnosis not present

## 2018-04-21 DIAGNOSIS — M545 Low back pain: Secondary | ICD-10-CM | POA: Diagnosis not present

## 2018-04-21 DIAGNOSIS — I10 Essential (primary) hypertension: Secondary | ICD-10-CM | POA: Diagnosis not present

## 2018-04-21 DIAGNOSIS — G5603 Carpal tunnel syndrome, bilateral upper limbs: Secondary | ICD-10-CM | POA: Diagnosis not present

## 2018-04-21 DIAGNOSIS — E6609 Other obesity due to excess calories: Secondary | ICD-10-CM | POA: Diagnosis not present

## 2018-04-21 DIAGNOSIS — F319 Bipolar disorder, unspecified: Secondary | ICD-10-CM | POA: Diagnosis not present

## 2018-04-21 DIAGNOSIS — M75101 Unspecified rotator cuff tear or rupture of right shoulder, not specified as traumatic: Secondary | ICD-10-CM | POA: Diagnosis not present

## 2018-04-21 DIAGNOSIS — M542 Cervicalgia: Secondary | ICD-10-CM | POA: Diagnosis not present

## 2018-04-21 DIAGNOSIS — E1165 Type 2 diabetes mellitus with hyperglycemia: Secondary | ICD-10-CM | POA: Diagnosis not present

## 2018-04-21 DIAGNOSIS — Z716 Tobacco abuse counseling: Secondary | ICD-10-CM | POA: Diagnosis not present

## 2018-04-21 DIAGNOSIS — R221 Localized swelling, mass and lump, neck: Secondary | ICD-10-CM | POA: Diagnosis not present

## 2018-05-09 DIAGNOSIS — H524 Presbyopia: Secondary | ICD-10-CM | POA: Diagnosis not present

## 2018-05-09 DIAGNOSIS — H52209 Unspecified astigmatism, unspecified eye: Secondary | ICD-10-CM | POA: Diagnosis not present

## 2018-05-09 DIAGNOSIS — H5213 Myopia, bilateral: Secondary | ICD-10-CM | POA: Diagnosis not present

## 2018-05-11 DIAGNOSIS — M546 Pain in thoracic spine: Secondary | ICD-10-CM | POA: Diagnosis not present

## 2018-05-11 DIAGNOSIS — M542 Cervicalgia: Secondary | ICD-10-CM | POA: Diagnosis not present

## 2018-05-11 DIAGNOSIS — N62 Hypertrophy of breast: Secondary | ICD-10-CM | POA: Diagnosis not present

## 2018-05-16 ENCOUNTER — Emergency Department (HOSPITAL_COMMUNITY): Payer: Medicare HMO

## 2018-05-16 ENCOUNTER — Emergency Department (HOSPITAL_COMMUNITY)
Admission: EM | Admit: 2018-05-16 | Discharge: 2018-05-16 | Disposition: A | Discharge status: Home / Self Care | Payer: Medicare HMO | Attending: Emergency Medicine | Admitting: Emergency Medicine

## 2018-05-16 ENCOUNTER — Other Ambulatory Visit: Payer: Self-pay

## 2018-05-16 ENCOUNTER — Encounter (HOSPITAL_COMMUNITY): Payer: Self-pay | Admitting: Emergency Medicine

## 2018-05-16 DIAGNOSIS — R05 Cough: Secondary | ICD-10-CM | POA: Diagnosis not present

## 2018-05-16 DIAGNOSIS — Z7984 Long term (current) use of oral hypoglycemic drugs: Secondary | ICD-10-CM | POA: Insufficient documentation

## 2018-05-16 DIAGNOSIS — M25511 Pain in right shoulder: Secondary | ICD-10-CM

## 2018-05-16 DIAGNOSIS — F17200 Nicotine dependence, unspecified, uncomplicated: Secondary | ICD-10-CM | POA: Insufficient documentation

## 2018-05-16 DIAGNOSIS — J069 Acute upper respiratory infection, unspecified: Secondary | ICD-10-CM | POA: Diagnosis not present

## 2018-05-16 DIAGNOSIS — E119 Type 2 diabetes mellitus without complications: Secondary | ICD-10-CM | POA: Insufficient documentation

## 2018-05-16 DIAGNOSIS — R0981 Nasal congestion: Secondary | ICD-10-CM | POA: Diagnosis present

## 2018-05-16 DIAGNOSIS — I10 Essential (primary) hypertension: Secondary | ICD-10-CM | POA: Insufficient documentation

## 2018-05-16 DIAGNOSIS — Z79899 Other long term (current) drug therapy: Secondary | ICD-10-CM | POA: Insufficient documentation

## 2018-05-16 LAB — GROUP A STREP BY PCR: Group A Strep by PCR: NOT DETECTED

## 2018-05-16 MED ORDER — GUAIFENESIN ER 600 MG PO TB12: 600.0000 mg | ORAL_TABLET | Freq: Two times a day (BID) | ORAL | 0 refills | Status: AC

## 2018-05-16 MED ORDER — KETOROLAC TROMETHAMINE 60 MG/2ML IM SOLN
30.0000 mg | Freq: Once | INTRAMUSCULAR | Status: AC
  Administered 2018-05-16: 30 mg via INTRAMUSCULAR
  Filled 2018-05-16: qty 2

## 2018-05-16 MED ORDER — MELOXICAM 7.5 MG PO TABS: 7.5000 mg | ORAL_TABLET | Freq: Every day | ORAL | 0 refills | Status: AC

## 2018-05-16 NOTE — ED Provider Notes (Signed)
[facility] EMERGENCY DEPARTMENT Provider Note  CSN: [id number] Arrival date & time: 05/16/18  0731  History   Chief Complaint Chief Complaint  Patient presents with  . Nasal Congestion  . Sore Throat    HPI Erin Jackson is a 52 y.o. female with a medical history of Type 2 DM, HTN, GERD and chronic who presented to the ED for sore throat and right shoulder pain.  Sore throat began 2 days ago. She endorses recent sick contact. Associated symptoms: congestion, rhinorrhea, postnasal drip, sinus pressure and cough without sputum production. Denies fever, chest pain, SOB, wheezing. Denies trismus, drooling, voice change or dysphagia. Patient has tried Tylenol prior to coming to the ED without relief.  Patient has had shoulder pain x 2-3 months, but has worsened over the last month. She states it is painful to move and irritates her when she is laying down at night. Denies any trauma or injury. Discussed this with her PCP and stated that it is possibly a rotator cuff issue, but has not been seen by ortho or had any imaging done.  Additional history provided by medical chart. Patient established with [facility] for lumbar DDD and was last seen in 01/2018. No complaints of shoulder issues documented at that time.  Past Medical History:  Diagnosis Date  . Diabetes mellitus without complication (HCC)   . GERD (gastroesophageal reflux disease)   . Hypertension     Patient Active Problem List   Diagnosis Date Noted  . Chronic neck pain 08/20/2017  . Supraclavicular mass 08/20/2017  . Hypertension associated with diabetes (HCC) 08/20/2017  . T2DM (type 2 diabetes mellitus) (HCC) 08/20/2017  . GERD (gastroesophageal reflux disease) 08/20/2017  . Cigarette nicotine dependence without complication 08/20/2017    Past Surgical History:  Procedure Laterality Date  . ABDOMINAL HYSTERECTOMY    . CERVICAL SPINE SURGERY  2015   has a metal plate  . FOOT  SURGERY Left 2014     OB History   None      Home Medications    Prior to Admission medications   Medication Sig Start Date End Date Taking? Authorizing Provider  alprazolam Prudy Feeler) 2 MG tablet Take 2 mg by mouth 2 (two) times daily as needed for sleep.    [provider]  cloNIDine (CATAPRES) 0.2 MG tablet Take 0.4 mg by mouth at bedtime.    [provider]  diphenhydrAMINE (BENADRYL) 25 mg capsule Take 25 mg by mouth every 6 (six) hours as needed for itching.    [provider]  EPINEPHrine 0.3 mg/0.3 mL IJ SOAJ injection Inject 0.3 mg into the muscle once as needed (allergic reaction).    [provider]  famotidine (PEPCID) 20 MG tablet Take 1 tablet (20 mg total) by mouth 2 (two) times daily. Patient not taking: Reported on 01/26/2018 11/01/16   Pricilla Loveless, MD  fluticasone [facility]) 50 MCG/ACT nasal spray Place 2 sprays into both nostrils daily. Patient not taking: Reported on 01/26/2018 03/13/17   Burgess Amor, PA-C  gabapentin (NEURONTIN) 100 MG capsule Take 1 capsule (100 mg total) by mouth 3 (three) times daily for 3 days. 12/25/17 12/28/17  Shaune Pollack, MD  HYDROcodone-acetaminophen (NORCO/VICODIN) 5-325 MG tablet Take 2 tablets by mouth every 6 (six) hours as needed for severe pain. Patient not taking: Reported on 12/25/2017 10/21/17   Mathews Robinsons B, PA-C  HYDROmorphone (DILAUDID) 2 MG tablet Take 0.5-1 tablets (1-2 mg total) by mouth every 4 (four) hours as  needed for moderate pain or severe pain (Take one half tablet for moderate pain, one full tablet for severe pain). Patient not taking: Reported on 01/26/2018 12/25/17   Shaune Pollack, MD  JARDIANCE 10 MG TABS tablet Take 10 mg by mouth at bedtime. 12/15/17   [provider]  meloxicam (MOBIC) 7.5 MG tablet Take 1 tablet (7.5 mg total) by mouth daily. 05/16/18 06/15/18  Willine Schwalbe, Jerrel Ivory I, PA-C  metFORMIN (GLUCOPHAGE) 500 MG tablet Take 500 mg by mouth 2 (two) times daily with a  meal.    [provider]  omeprazole (PRILOSEC) 20 MG capsule Take 1 capsule (20 mg total) by mouth daily. 11/01/16   Pricilla Loveless, MD  oxyCODONE-acetaminophen (PERCOCET/ROXICET) 5-325 MG tablet Take 1 tablet by mouth every 6 (six) hours as needed for moderate pain or severe pain.    [provider]  traZODone (DESYREL) 150 MG tablet Take 150 mg by mouth at bedtime.    [provider]    Family History History reviewed. No pertinent family history.  Social History Social History   Tobacco Use  . Smoking status: Current Every Day Smoker  . Smokeless tobacco: Never Used  . Tobacco comment: one pack every 3 days  Substance Use Topics  . Alcohol use: Yes    Comment: socially  . Drug use: Yes    Types: Marijuana    Comment: 2 x's a week for pain     Allergies   Citrus; Norvasc [amlodipine]; Other; Aspirin; Ibuprofen; and Niacin and related   Review of Systems Review of Systems  Constitutional: Negative for fever.  HENT: Positive for congestion, ear pain, postnasal drip, rhinorrhea, sinus pressure, sinus pain and sore throat. Negative for drooling, trouble swallowing and voice change.   Eyes: Negative.   Respiratory: Positive for cough. Negative for chest tightness, shortness of breath and wheezing.   Cardiovascular: Negative for chest pain.  Musculoskeletal: Positive for arthralgias. Negative for myalgias and neck pain.  Skin: Negative for rash.  Neurological: Negative for numbness and headaches.     Physical Exam Updated Vital Signs BP (!) 168/124 (BP Location: Right Arm)   Pulse (!) 104   Temp 98.7 F (37.1 C) (Oral)   Resp 20   SpO2 98%   Physical Exam  Constitutional:  Non-toxic appearance. She does not appear ill.  HENT:  Right Ear: Hearing, tympanic membrane and ear canal normal.  Left Ear: Hearing, tympanic membrane and ear canal normal.  Nose: Mucosal edema present. Right sinus exhibits no maxillary sinus tenderness and no  frontal sinus tenderness. Left sinus exhibits no maxillary sinus tenderness and no frontal sinus tenderness.  Mouth/Throat: Uvula is midline, oropharynx is clear and moist and mucous membranes are normal. No oropharyngeal exudate, posterior oropharyngeal edema, posterior oropharyngeal erythema or tonsillar abscesses. Tonsils are 0 on the right. Tonsils are 0 on the left. No tonsillar exudate.  Neck: Normal range of motion. Neck supple.  Cardiovascular: Normal rate and normal heart sounds.  Pulmonary/Chest: Effort normal and breath sounds normal.  Musculoskeletal:       Right shoulder: She exhibits decreased range of motion, tenderness and decreased strength. She exhibits no swelling and no laceration.       Left shoulder: Normal.       Cervical back: Normal.  Limited external rotation, 4/5 strength and + impingement signs of right shoulder. Tenderness over bicep tendon insertion. Right elbow and wrist normal.   Neurological: No sensory deficit. She exhibits normal muscle tone.  4/5 strength in  right shoulder, especially with flexion and external rotation.  Skin: Skin is warm, dry and intact. No bruising, no ecchymosis and no rash noted.  No warmth, erythema or edema over right shoulder joint.  Nursing note and vitals reviewed.    ED Treatments / Results  Labs (all labs ordered are listed, but only abnormal results are displayed) Labs Reviewed  GROUP A STREP BY PCR   EKG None  Radiology No results found.  Procedures Procedures (including critical care time)  Medications Ordered in ED Medications  ketorolac (TORADOL) injection 30 mg (has no administration in time range)    Initial Impression / Assessment and Plan / ED Course  Triage vital signs and the nursing notes have been reviewed.  Pertinent labs & imaging results that were available during care of the patient were reviewed and considered in medical decision making (see chart for details). Clinical Course as of May 16 913  Mon May 16, 2018  2956 Negative strep results. Upper respiratory symptoms most likely viral etiology.   [GM]  [id number] Shoulder x-ray showed no fractures or dislocations. No apparent evidence of arthritis, calcifications or erosive damage in the joint that could be contributing to pain. Likely muscular etiology. Additional imaging can be conducted outpatient.   [GM]    Clinical Course User Index [GM] Bartolo Montanye, Sharyon Medicus, PA-C   Patient presents in no acute distress and does not appear ill. No signs of tonsillar or peritonsillar abscess. Patient's upper respiratory symptoms most likely viral etiology. Negative strep. No indication for abx at this time and can continue supportive care.  Shoulder x-ray showed no fractures or dislocations that require further intervention today. Denies systemic s/s or history that would indicate an infectious or rheumatologic process in the joint. Shoulder pain likely 2/2 rotator cuff or biceps tendon etiology given physical exam findings. No additional imaging or intervention needed at this time and can be done on outpatient basis with her established ortho provider.  Final Clinical Impressions(s) / ED Diagnoses  1. Viral URI. Education provided. Supportive care. 2. Right Shoulder Pain. Toradol  IM x1 given in the ED. Rotator cuff vs biceps tendon involvement. Mobic  QD Rx given. Advised to follow-up with her orthopedic provider regarding shoulder and additional imaging needs.  Dispo: Home. After thorough clinical evaluation, this patient is determined to be medically stable and can be safely discharged with the previously mentioned treatment and/or outpatient follow-up/referral(s). At this time, there are no other apparent medical conditions that require further screening, evaluation or treatment.  Final diagnoses:  Viral upper respiratory tract infection  Acute pain of right shoulder    ED Discharge Orders        Ordered    meloxicam (MOBIC) 7.5  MG tablet  Daily     05/16/18 0905       Windy Carina, PA-C 05/16/18 0915    Eber Hong, MD 05/17/18 1105

## 2018-05-16 NOTE — ED Triage Notes (Signed)
Pt states she developed a sore throat, congestion on Saturday. Pt also complains of headache/sinus pressure

## 2018-07-05 NOTE — Telephone Encounter (Signed)
The pt is calling to see if she can get a letter to state what she is allergic too.

## 2018-07-08 NOTE — Telephone Encounter (Signed)
Letters with documented allergies and reactions printed.     Please see if she will pick up letter or prefer to be mailed.

## 2018-07-18 DIAGNOSIS — I10 Essential (primary) hypertension: Secondary | ICD-10-CM | POA: Diagnosis not present

## 2018-07-18 DIAGNOSIS — Z6839 Body mass index (BMI) 39.0-39.9, adult: Secondary | ICD-10-CM | POA: Diagnosis not present

## 2018-07-18 DIAGNOSIS — J399 Disease of upper respiratory tract, unspecified: Secondary | ICD-10-CM | POA: Diagnosis not present

## 2018-07-18 DIAGNOSIS — F319 Bipolar disorder, unspecified: Secondary | ICD-10-CM | POA: Diagnosis not present

## 2018-07-18 DIAGNOSIS — E669 Obesity, unspecified: Secondary | ICD-10-CM | POA: Diagnosis not present

## 2018-07-18 DIAGNOSIS — E1165 Type 2 diabetes mellitus with hyperglycemia: Secondary | ICD-10-CM | POA: Diagnosis not present

## 2018-07-21 HISTORY — PX: REDUCTION MAMMAPLASTY: SUR839

## 2018-08-03 DIAGNOSIS — M546 Pain in thoracic spine: Secondary | ICD-10-CM | POA: Diagnosis not present

## 2018-08-03 DIAGNOSIS — M542 Cervicalgia: Secondary | ICD-10-CM | POA: Diagnosis not present

## 2018-08-03 DIAGNOSIS — N62 Hypertrophy of breast: Secondary | ICD-10-CM | POA: Diagnosis not present

## 2018-08-09 ENCOUNTER — Other Ambulatory Visit: Payer: Self-pay

## 2018-08-09 ENCOUNTER — Encounter (HOSPITAL_BASED_OUTPATIENT_CLINIC_OR_DEPARTMENT_OTHER): Payer: Self-pay | Admitting: *Deleted

## 2018-08-12 ENCOUNTER — Encounter (HOSPITAL_BASED_OUTPATIENT_CLINIC_OR_DEPARTMENT_OTHER): Payer: Self-pay | Admitting: Anesthesiology

## 2018-08-12 ENCOUNTER — Encounter (HOSPITAL_BASED_OUTPATIENT_CLINIC_OR_DEPARTMENT_OTHER)
Admission: RE | Admit: 2018-08-12 | Discharge: 2018-08-12 | Disposition: A | Discharge status: Home / Self Care | Payer: Medicare HMO | Source: Ambulatory Visit | Attending: Specialist | Admitting: Specialist

## 2018-08-12 DIAGNOSIS — L304 Erythema intertrigo: Secondary | ICD-10-CM | POA: Diagnosis not present

## 2018-08-12 DIAGNOSIS — Z6839 Body mass index (BMI) 39.0-39.9, adult: Secondary | ICD-10-CM | POA: Diagnosis not present

## 2018-08-12 DIAGNOSIS — F17200 Nicotine dependence, unspecified, uncomplicated: Secondary | ICD-10-CM | POA: Diagnosis not present

## 2018-08-12 DIAGNOSIS — E669 Obesity, unspecified: Secondary | ICD-10-CM | POA: Diagnosis not present

## 2018-08-12 DIAGNOSIS — Z79899 Other long term (current) drug therapy: Secondary | ICD-10-CM | POA: Diagnosis not present

## 2018-08-12 DIAGNOSIS — N62 Hypertrophy of breast: Secondary | ICD-10-CM | POA: Diagnosis not present

## 2018-08-12 DIAGNOSIS — Z7984 Long term (current) use of oral hypoglycemic drugs: Secondary | ICD-10-CM | POA: Diagnosis not present

## 2018-08-12 DIAGNOSIS — F419 Anxiety disorder, unspecified: Secondary | ICD-10-CM | POA: Diagnosis not present

## 2018-08-12 DIAGNOSIS — M549 Dorsalgia, unspecified: Secondary | ICD-10-CM | POA: Diagnosis not present

## 2018-08-12 DIAGNOSIS — I10 Essential (primary) hypertension: Secondary | ICD-10-CM | POA: Diagnosis not present

## 2018-08-12 DIAGNOSIS — E119 Type 2 diabetes mellitus without complications: Secondary | ICD-10-CM | POA: Diagnosis not present

## 2018-08-12 DIAGNOSIS — Z886 Allergy status to analgesic agent status: Secondary | ICD-10-CM | POA: Diagnosis not present

## 2018-08-12 DIAGNOSIS — M25519 Pain in unspecified shoulder: Secondary | ICD-10-CM | POA: Diagnosis not present

## 2018-08-12 DIAGNOSIS — K219 Gastro-esophageal reflux disease without esophagitis: Secondary | ICD-10-CM | POA: Diagnosis not present

## 2018-08-12 DIAGNOSIS — Z888 Allergy status to other drugs, medicaments and biological substances status: Secondary | ICD-10-CM | POA: Diagnosis not present

## 2018-08-12 LAB — BASIC METABOLIC PANEL
Anion gap: 7 (ref 5–15)
BUN: 14 mg/dL (ref 6–20)
CO2: 24 mmol/L (ref 22–32)
Calcium: 9.1 mg/dL (ref 8.9–10.3)
Chloride: 107 mmol/L (ref 98–111)
Creatinine, Ser: 0.85 mg/dL (ref 0.44–1.00)
GFR calc Af Amer: >60 mL/min (ref >60)
GFR calc non Af Amer: >60 mL/min (ref >60)
Glucose, Bld: 146 mg/dL — ABNORMAL HIGH (ref 70–99)
Potassium: 4.6 mmol/L (ref 3.5–5.1)
Sodium: 138 mmol/L (ref 135–145)

## 2018-08-12 NOTE — Anesthesia Preprocedure Evaluation (Addendum)
Anesthesia Evaluation  Patient identified by MRN, date of birth, ID band Patient awake    Reviewed: Allergy & Precautions, NPO status , Patient's Chart, lab work & pertinent test results  Airway Mallampati: I       Dental no notable dental hx. (+) Teeth Intact   Pulmonary Current Smoker,    Pulmonary exam normal breath sounds clear to auscultation       Cardiovascular hypertension, Normal cardiovascular exam Rhythm:Regular Rate:Normal     Neuro/Psych PSYCHIATRIC DISORDERS Anxiety negative neurological ROS     GI/Hepatic Neg liver ROS, GERD  Medicated,  Endo/Other  diabetes, Type 2, Oral Hypoglycemic Agents  Renal/GU negative Renal ROS     Musculoskeletal negative musculoskeletal ROS (+)   Abdominal (+) + obese,   Peds  Hematology negative hematology ROS (+)   Anesthesia Other Findings   Reproductive/Obstetrics                           Anesthesia Physical Anesthesia Plan  ASA: II  Anesthesia Plan: General   Post-op Pain Management:    Induction: Intravenous  PONV Risk Score and Plan: 3 and Ondansetron, Dexamethasone and Midazolam  Airway Management Planned: Oral ETT  Additional Equipment: None  Intra-op Plan:   Post-operative Plan: Extubation in OR  Informed Consent: I have reviewed the patients History and Physical, chart, labs and discussed the procedure including the risks, benefits and alternatives for the proposed anesthesia with the patient or authorized representative who has indicated his/her understanding and acceptance.     Plan Discussed with: CRNA and Surgeon  Anesthesia Plan Comments:        Anesthesia Quick Evaluation

## 2018-08-15 ENCOUNTER — Ambulatory Visit (HOSPITAL_BASED_OUTPATIENT_CLINIC_OR_DEPARTMENT_OTHER): Payer: Medicare HMO | Admitting: Anesthesiology

## 2018-08-15 ENCOUNTER — Encounter (HOSPITAL_BASED_OUTPATIENT_CLINIC_OR_DEPARTMENT_OTHER)
Admission: RE | Disposition: A | Discharge status: Home / Self Care | Payer: Self-pay | Source: Ambulatory Visit | Attending: Specialist

## 2018-08-15 ENCOUNTER — Other Ambulatory Visit: Payer: Self-pay

## 2018-08-15 ENCOUNTER — Ambulatory Visit (HOSPITAL_BASED_OUTPATIENT_CLINIC_OR_DEPARTMENT_OTHER)
Admission: RE | Admit: 2018-08-15 | Discharge: 2018-08-15 | Disposition: A | Discharge status: Home / Self Care | Payer: Medicare HMO | Source: Ambulatory Visit | Attending: Specialist | Admitting: Specialist

## 2018-08-15 ENCOUNTER — Encounter (HOSPITAL_BASED_OUTPATIENT_CLINIC_OR_DEPARTMENT_OTHER): Payer: Self-pay

## 2018-08-15 DIAGNOSIS — F419 Anxiety disorder, unspecified: Secondary | ICD-10-CM | POA: Diagnosis not present

## 2018-08-15 DIAGNOSIS — Z886 Allergy status to analgesic agent status: Secondary | ICD-10-CM | POA: Insufficient documentation

## 2018-08-15 DIAGNOSIS — E119 Type 2 diabetes mellitus without complications: Secondary | ICD-10-CM | POA: Diagnosis not present

## 2018-08-15 DIAGNOSIS — Z6839 Body mass index (BMI) 39.0-39.9, adult: Secondary | ICD-10-CM | POA: Insufficient documentation

## 2018-08-15 DIAGNOSIS — E669 Obesity, unspecified: Secondary | ICD-10-CM | POA: Insufficient documentation

## 2018-08-15 DIAGNOSIS — L304 Erythema intertrigo: Secondary | ICD-10-CM | POA: Diagnosis not present

## 2018-08-15 DIAGNOSIS — Z7984 Long term (current) use of oral hypoglycemic drugs: Secondary | ICD-10-CM | POA: Insufficient documentation

## 2018-08-15 DIAGNOSIS — M25519 Pain in unspecified shoulder: Secondary | ICD-10-CM | POA: Insufficient documentation

## 2018-08-15 DIAGNOSIS — N62 Hypertrophy of breast: Secondary | ICD-10-CM | POA: Diagnosis not present

## 2018-08-15 DIAGNOSIS — F17200 Nicotine dependence, unspecified, uncomplicated: Secondary | ICD-10-CM | POA: Insufficient documentation

## 2018-08-15 DIAGNOSIS — Z888 Allergy status to other drugs, medicaments and biological substances status: Secondary | ICD-10-CM | POA: Insufficient documentation

## 2018-08-15 DIAGNOSIS — M546 Pain in thoracic spine: Secondary | ICD-10-CM | POA: Diagnosis not present

## 2018-08-15 DIAGNOSIS — I10 Essential (primary) hypertension: Secondary | ICD-10-CM | POA: Diagnosis not present

## 2018-08-15 DIAGNOSIS — M542 Cervicalgia: Secondary | ICD-10-CM | POA: Diagnosis not present

## 2018-08-15 DIAGNOSIS — K219 Gastro-esophageal reflux disease without esophagitis: Secondary | ICD-10-CM | POA: Insufficient documentation

## 2018-08-15 DIAGNOSIS — Z79899 Other long term (current) drug therapy: Secondary | ICD-10-CM | POA: Insufficient documentation

## 2018-08-15 DIAGNOSIS — M549 Dorsalgia, unspecified: Secondary | ICD-10-CM | POA: Insufficient documentation

## 2018-08-15 DIAGNOSIS — R69 Illness, unspecified: Secondary | ICD-10-CM | POA: Diagnosis not present

## 2018-08-15 HISTORY — PX: BREAST REDUCTION SURGERY: SHX8

## 2018-08-15 LAB — GLUCOSE, CAPILLARY
Glucose-Capillary: 231 mg/dL — ABNORMAL HIGH (ref 70–99)
Glucose-Capillary: 278 mg/dL — ABNORMAL HIGH (ref 70–99)

## 2018-08-15 SURGERY — MAMMOPLASTY, REDUCTION
Anesthesia: General | Site: Breast | Laterality: Bilateral

## 2018-08-15 MED ORDER — METOPROLOL TARTRATE 5 MG/5ML IV SOLN
INTRAVENOUS | Status: AC
  Filled 2018-08-15: qty 5

## 2018-08-15 MED ORDER — DEXAMETHASONE SODIUM PHOSPHATE 10 MG/ML IJ SOLN
INTRAMUSCULAR | Status: AC
  Filled 2018-08-15: qty 1

## 2018-08-15 MED ORDER — LACTATED RINGERS IV SOLN: INTRAVENOUS | Status: DC

## 2018-08-15 MED ORDER — SODIUM BICARBONATE 4 % IV SOLN
INTRAVENOUS | Status: AC
  Filled 2018-08-15: qty 5

## 2018-08-15 MED ORDER — ROCURONIUM BROMIDE 50 MG/5ML IV SOSY
PREFILLED_SYRINGE | INTRAVENOUS | Status: AC
  Filled 2018-08-15: qty 5

## 2018-08-15 MED ORDER — OXYCODONE HCL 5 MG/5ML PO SOLN: 5.0000 mg | Freq: Once | ORAL | Status: DC | PRN

## 2018-08-15 MED ORDER — LACTATED RINGERS IV SOLN
INTRAVENOUS | Status: AC | PRN
  Administered 2018-08-15: 1000 mL

## 2018-08-15 MED ORDER — PHENYLEPHRINE 40 MCG/ML (10ML) SYRINGE FOR IV PUSH (FOR BLOOD PRESSURE SUPPORT)
PREFILLED_SYRINGE | INTRAVENOUS | Status: AC
  Filled 2018-08-15: qty 10

## 2018-08-15 MED ORDER — CHLORHEXIDINE GLUCONATE CLOTH 2 % EX PADS: 6.0000 | MEDICATED_PAD | Freq: Once | CUTANEOUS | Status: DC

## 2018-08-15 MED ORDER — CEFAZOLIN SODIUM-DEXTROSE 2-4 GM/100ML-% IV SOLN
INTRAVENOUS | Status: AC
  Filled 2018-08-15: qty 100

## 2018-08-15 MED ORDER — LIDOCAINE 2% (20 MG/ML) 5 ML SYRINGE
INTRAMUSCULAR | Status: AC
  Filled 2018-08-15: qty 5

## 2018-08-15 MED ORDER — SCOPOLAMINE 1 MG/3DAYS TD PT72: 1.0000 | MEDICATED_PATCH | Freq: Once | TRANSDERMAL | Status: DC | PRN

## 2018-08-15 MED ORDER — MIDAZOLAM HCL 5 MG/5ML IJ SOLN
INTRAMUSCULAR | Status: DC | PRN
  Administered 2018-08-15: 2 mg via INTRAVENOUS

## 2018-08-15 MED ORDER — LIDOCAINE HCL (CARDIAC) PF 100 MG/5ML IV SOSY
PREFILLED_SYRINGE | INTRAVENOUS | Status: DC | PRN
  Administered 2018-08-15: 100 mg via INTRAVENOUS

## 2018-08-15 MED ORDER — MEPERIDINE HCL 25 MG/ML IJ SOLN: 6.2500 mg | INTRAMUSCULAR | Status: DC | PRN

## 2018-08-15 MED ORDER — ONDANSETRON HCL 4 MG/2ML IJ SOLN
INTRAMUSCULAR | Status: DC | PRN
  Administered 2018-08-15: 4 mg via INTRAVENOUS

## 2018-08-15 MED ORDER — SUGAMMADEX SODIUM 200 MG/2ML IV SOLN
INTRAVENOUS | Status: DC | PRN
  Administered 2018-08-15: 200 mg via INTRAVENOUS

## 2018-08-15 MED ORDER — FENTANYL CITRATE (PF) 100 MCG/2ML IJ SOLN: 50.0000 ug | INTRAMUSCULAR | Status: DC | PRN

## 2018-08-15 MED ORDER — EPINEPHRINE 30 MG/30ML IJ SOLN
INTRAMUSCULAR | Status: AC
  Filled 2018-08-15: qty 1

## 2018-08-15 MED ORDER — FENTANYL CITRATE (PF) 100 MCG/2ML IJ SOLN
INTRAMUSCULAR | Status: DC | PRN
  Administered 2018-08-15 (×2): 50 ug via INTRAVENOUS
  Administered 2018-08-15: 100 ug via INTRAVENOUS

## 2018-08-15 MED ORDER — CEFAZOLIN SODIUM-DEXTROSE 2-4 GM/100ML-% IV SOLN
2.0000 g | INTRAVENOUS | Status: AC
  Administered 2018-08-15: 2 g via INTRAVENOUS

## 2018-08-15 MED ORDER — HYDROMORPHONE HCL 1 MG/ML IJ SOLN
INTRAMUSCULAR | Status: AC
  Filled 2018-08-15: qty 0.5

## 2018-08-15 MED ORDER — LIDOCAINE HCL 2 % IJ SOLN
INTRAMUSCULAR | Status: AC
  Filled 2018-08-15: qty 100

## 2018-08-15 MED ORDER — PHENYLEPHRINE HCL 10 MG/ML IJ SOLN
INTRAMUSCULAR | Status: DC | PRN
  Administered 2018-08-15 (×6): 80 ug via INTRAVENOUS

## 2018-08-15 MED ORDER — PROMETHAZINE HCL 25 MG/ML IJ SOLN: 6.2500 mg | INTRAMUSCULAR | Status: DC | PRN

## 2018-08-15 MED ORDER — FENTANYL CITRATE (PF) 100 MCG/2ML IJ SOLN
INTRAMUSCULAR | Status: AC
  Filled 2018-08-15: qty 2

## 2018-08-15 MED ORDER — MIDAZOLAM HCL 2 MG/2ML IJ SOLN
INTRAMUSCULAR | Status: AC
  Filled 2018-08-15: qty 2

## 2018-08-15 MED ORDER — HYDROMORPHONE HCL 1 MG/ML IJ SOLN
0.2500 mg | INTRAMUSCULAR | Status: DC | PRN
  Administered 2018-08-15 (×3): 0.5 mg via INTRAVENOUS

## 2018-08-15 MED ORDER — EPHEDRINE 5 MG/ML INJ
INTRAVENOUS | Status: AC
  Filled 2018-08-15: qty 10

## 2018-08-15 MED ORDER — ROCURONIUM BROMIDE 100 MG/10ML IV SOLN
INTRAVENOUS | Status: DC | PRN
  Administered 2018-08-15 (×2): 25 mg via INTRAVENOUS

## 2018-08-15 MED ORDER — METOPROLOL TARTRATE 5 MG/5ML IV SOLN
INTRAVENOUS | Status: DC | PRN
  Administered 2018-08-15 (×2): 2.5 mg via INTRAVENOUS

## 2018-08-15 MED ORDER — SUCCINYLCHOLINE CHLORIDE 20 MG/ML IJ SOLN
INTRAMUSCULAR | Status: DC | PRN
  Administered 2018-08-15: 80 mg via INTRAVENOUS

## 2018-08-15 MED ORDER — EPINEPHRINE PF 1 MG/ML IJ SOLN
INTRAMUSCULAR | Status: DC | PRN
  Administered 2018-08-15: 1 mg

## 2018-08-15 MED ORDER — ONDANSETRON HCL 4 MG/2ML IJ SOLN
INTRAMUSCULAR | Status: AC
  Filled 2018-08-15: qty 2

## 2018-08-15 MED ORDER — LIDOCAINE HCL 2 % IJ SOLN
INTRAMUSCULAR | Status: DC | PRN
  Administered 2018-08-15: 100 mL

## 2018-08-15 MED ORDER — SODIUM BICARBONATE 4 % IV SOLN
INTRAVENOUS | Status: DC | PRN
  Administered 2018-08-15: 5 mL

## 2018-08-15 MED ORDER — OXYCODONE HCL 5 MG PO TABS
ORAL_TABLET | ORAL | Status: AC
  Filled 2018-08-15: qty 2

## 2018-08-15 MED ORDER — SUCCINYLCHOLINE CHLORIDE 200 MG/10ML IV SOSY
PREFILLED_SYRINGE | INTRAVENOUS | Status: AC
  Filled 2018-08-15: qty 10

## 2018-08-15 MED ORDER — OXYCODONE HCL 5 MG PO TABS: 5.0000 mg | ORAL_TABLET | Freq: Once | ORAL | Status: DC | PRN

## 2018-08-15 MED ORDER — LIDOCAINE-EPINEPHRINE 0.5 %-1:200000 IJ SOLN
INTRAMUSCULAR | Status: AC
  Filled 2018-08-15: qty 2

## 2018-08-15 MED ORDER — DEXAMETHASONE SODIUM PHOSPHATE 4 MG/ML IJ SOLN
INTRAMUSCULAR | Status: DC | PRN
  Administered 2018-08-15: 10 mg via INTRAVENOUS

## 2018-08-15 MED ORDER — PROPOFOL 10 MG/ML IV BOLUS
INTRAVENOUS | Status: AC
  Filled 2018-08-15: qty 20

## 2018-08-15 MED ORDER — PROPOFOL 10 MG/ML IV BOLUS
INTRAVENOUS | Status: DC | PRN
  Administered 2018-08-15: 30 mg via INTRAVENOUS
  Administered 2018-08-15: 150 mg via INTRAVENOUS
  Administered 2018-08-15: 50 mg via INTRAVENOUS

## 2018-08-15 MED ORDER — MIDAZOLAM HCL 2 MG/2ML IJ SOLN: 1.0000 mg | INTRAMUSCULAR | Status: DC | PRN

## 2018-08-15 MED ORDER — OXYCODONE HCL 5 MG PO TABS
10.0000 mg | ORAL_TABLET | Freq: Once | ORAL | Status: AC
  Administered 2018-08-15: 10 mg via ORAL

## 2018-08-15 MED ORDER — LACTATED RINGERS IV SOLN
INTRAVENOUS | Status: DC
  Administered 2018-08-15 (×2): via INTRAVENOUS

## 2018-08-15 SURGICAL SUPPLY — 67 items
BAG DECANTER FOR FLEXI CONT (MISCELLANEOUS) IMPLANT
BENZOIN TINCTURE PRP APPL 2/3 (GAUZE/BANDAGES/DRESSINGS) ×6 IMPLANT
BINDER BREAST XLRG (GAUZE/BANDAGES/DRESSINGS) IMPLANT
BINDER BREAST XXLRG (GAUZE/BANDAGES/DRESSINGS) IMPLANT
BLADE KNIFE  20 PERSONNA (BLADE) ×4
BLADE KNIFE 20 PERSONNA (BLADE) ×2 IMPLANT
BLADE KNIFE PERSONA 10 (BLADE) IMPLANT
BLADE KNIFE PERSONA 15 (BLADE) ×3 IMPLANT
CANISTER SUCT 1200ML W/VALVE (MISCELLANEOUS) ×3 IMPLANT
COVER BACK TABLE 60X90IN (DRAPES) ×3 IMPLANT
COVER MAYO STAND STRL (DRAPES) ×3 IMPLANT
DECANTER SPIKE VIAL GLASS SM (MISCELLANEOUS) ×6 IMPLANT
DRAIN CHANNEL 10F 3/8 F FF (DRAIN) ×6 IMPLANT
DRAPE LAPAROSCOPIC ABDOMINAL (DRAPES) ×3 IMPLANT
DRSG PAD ABDOMINAL 8X10 ST (GAUZE/BANDAGES/DRESSINGS) ×12 IMPLANT
ELECT REM PT RETURN 9FT ADLT (ELECTROSURGICAL) ×3
ELECTRODE REM PT RTRN 9FT ADLT (ELECTROSURGICAL) ×1 IMPLANT
EVACUATOR SILICONE 100CC (DRAIN) ×6 IMPLANT
GAUZE SPONGE 4X4 12PLY STRL (GAUZE/BANDAGES/DRESSINGS) ×6 IMPLANT
GAUZE XEROFORM 5X9 LF (GAUZE/BANDAGES/DRESSINGS) ×6 IMPLANT
GLOVE BIO SURGEON STRL SZ 6.5 (GLOVE) ×2 IMPLANT
GLOVE BIO SURGEONS STRL SZ 6.5 (GLOVE) ×1
GLOVE BIOGEL M STRL SZ7.5 (GLOVE) ×3 IMPLANT
GLOVE BIOGEL PI IND STRL 8 (GLOVE) ×1 IMPLANT
GLOVE BIOGEL PI INDICATOR 8 (GLOVE) ×2
GLOVE ECLIPSE 7.0 STRL STRAW (GLOVE) ×3 IMPLANT
GOWN STRL REUS W/ TWL XL LVL3 (GOWN DISPOSABLE) ×2 IMPLANT
GOWN STRL REUS W/TWL XL LVL3 (GOWN DISPOSABLE) ×4
IV NS 500ML (IV SOLUTION)
IV NS 500ML BAXH (IV SOLUTION) IMPLANT
MARKER SKIN DUAL TIP RULER LAB (MISCELLANEOUS) ×3 IMPLANT
NEEDLE SPNL 18GX3.5 QUINCKE PK (NEEDLE) IMPLANT
NS IRRIG 1000ML POUR BTL (IV SOLUTION) ×9 IMPLANT
PACK BASIN DAY SURGERY FS (CUSTOM PROCEDURE TRAY) ×3 IMPLANT
PAD ALCOHOL SWAB (MISCELLANEOUS) ×6 IMPLANT
PEN SKIN MARKING BROAD TIP (MISCELLANEOUS) ×3 IMPLANT
PILLOW FOAM RUBBER ADULT (PILLOWS) ×3 IMPLANT
PIN SAFETY STERILE (MISCELLANEOUS) ×3 IMPLANT
SHEETING SILICONE GEL EPI DERM (MISCELLANEOUS) IMPLANT
SLEEVE SCD COMPRESS KNEE MED (MISCELLANEOUS) ×3 IMPLANT
SPECIMEN JAR MEDIUM (MISCELLANEOUS) IMPLANT
SPECIMEN JAR X LARGE (MISCELLANEOUS) ×6 IMPLANT
SPONGE LAP 18X18 RF (DISPOSABLE) ×12 IMPLANT
STRIP SUTURE WOUND CLOSURE 1/2 (SUTURE) ×15 IMPLANT
SUT MNCRL AB 3-0 PS2 18 (SUTURE) ×18 IMPLANT
SUT MON AB 2-0 CT1 36 (SUTURE) IMPLANT
SUT MON AB 5-0 PS2 18 (SUTURE) ×6 IMPLANT
SUT PROLENE 3 0 PS 2 (SUTURE) ×18 IMPLANT
SUT VLOC 180 0 24IN GS25 (SUTURE) ×6 IMPLANT
SUT VLOC 90 P-14 23 (SUTURE) ×6 IMPLANT
SYR 50ML LL SCALE MARK (SYRINGE) ×3 IMPLANT
SYR CONTROL 10ML LL (SYRINGE) IMPLANT
SYR TB 1ML LL NO SAFETY (SYRINGE) ×3 IMPLANT
TAPE HYPAFIX 6 X30' (GAUZE/BANDAGES/DRESSINGS) ×1
TAPE HYPAFIX 6X30 (GAUZE/BANDAGES/DRESSINGS) ×2 IMPLANT
TAPE MEASURE 72IN RETRACT (INSTRUMENTS)
TAPE MEASURE LINEN 72IN RETRCT (INSTRUMENTS) IMPLANT
TAPE PAPER 1X10 WHT MICROPORE (GAUZE/BANDAGES/DRESSINGS) ×3 IMPLANT
TOWEL GREEN STERILE FF (TOWEL DISPOSABLE) ×6 IMPLANT
TOWEL OR NON WOVEN STRL DISP B (DISPOSABLE) ×3 IMPLANT
TUBE CONNECTING 20'X1/4 (TUBING) ×1
TUBE CONNECTING 20X1/4 (TUBING) ×2 IMPLANT
TUBING INFILTRATION IT-10001 (TUBING) ×3 IMPLANT
TUBING SET GRADUATE ASPIR 12FT (MISCELLANEOUS) IMPLANT
UNDERPAD 30X30 (UNDERPADS AND DIAPERS) ×9 IMPLANT
VAC PENCILS W/TUBING CLEAR (MISCELLANEOUS) ×3 IMPLANT
YANKAUER SUCT BULB TIP NO VENT (SUCTIONS) ×3 IMPLANT

## 2018-08-15 NOTE — H&P (Signed)
Erin Jackson is an 52 y.o. female.   Chief Complaint: Severe macromastia HPI: Increased pitting and shoulder and back pain with intertrigo  Past Medical History:  Diagnosis Date  . Diabetes mellitus without complication (HCC)   . GERD (gastroesophageal reflux disease)   . Hypertension     Past Surgical History:  Procedure Laterality Date  . ABDOMINAL HYSTERECTOMY    . CERVICAL SPINE SURGERY  2015   has a metal plate  . FOOT SURGERY Left 2014  . HAND SURGERY      History reviewed. No pertinent family history. Social History:  reports that she has been smoking. She has never used smokeless tobacco. She reports that she drinks alcohol. She reports that she has current or past drug history. Drug: Marijuana.  Allergies:  Allergies  Allergen Reactions  . Citrus Shortness Of Breath  . Cucumber Extract Anaphylaxis  . Norvasc [Amlodipine] Other (See Comments)    Muscle became very tight  . Other Other (See Comments)    Tree nuts, walnuts  . Aspirin Rash  . Ibuprofen Rash  . Niacin And Related Rash    Medications Prior to Admission  Medication Sig Dispense Refill  . alprazolam (XANAX) 2 MG tablet Take 2 mg by mouth 2 (two) times daily as needed for sleep.    . cloNIDine (CATAPRES) 0.2 MG tablet Take 0.4 mg by mouth at bedtime.    . diphenhydrAMINE (BENADRYL) 25 mg capsule Take 25 mg by mouth every 6 (six) hours as needed for itching.    Marland Kitchen. EPINEPHrine 0.3 mg/0.3 mL IJ SOAJ injection Inject 0.3 mg into the muscle once as needed (allergic reaction).    . metFORMIN (GLUCOPHAGE) 500 MG tablet Take 500 mg by mouth 2 (two) times daily with a meal.    . omeprazole (PRILOSEC) 20 MG capsule Take 1 capsule (20 mg total) by mouth daily. 30 capsule 0  . traZODone (DESYREL) 150 MG tablet Take 150 mg by mouth at bedtime.    . fluticasone (FLONASE) 50 MCG/ACT nasal spray Place 2 sprays into both nostrils daily. (Patient not taking: Reported on 01/26/2018) 16 g 0    No results found for this or  any previous visit (from the past 48 hour(s)). No results found.  Review of Systems  Constitutional: Negative.   HENT: Negative.   Eyes: Negative.   Respiratory: Negative.   Cardiovascular: Negative.   Gastrointestinal: Negative.   Genitourinary: Negative.   Musculoskeletal: Positive for back pain and neck pain.  Skin: Positive for rash.  Neurological: Negative.   Endo/Heme/Allergies: Negative.   Psychiatric/Behavioral: Negative.     Blood pressure (!) 150/93, pulse 93, temperature 97.7 F (36.5 C), temperature source Oral, resp. rate 18, height 5' 2.5" (1.588 m), weight 98.9 kg, SpO2 100 %. Physical Exam  Constitutional: She appears well-developed and well-nourished.  HENT:  Head: Normocephalic.  Eyes: Pupils are equal, round, and reactive to light. Conjunctivae are normal.  Neck: Normal range of motion. Neck supple.  Cardiovascular: Normal rate and regular rhythm.  Respiratory: Effort normal and breath sounds normal.  GI: Soft.  Musculoskeletal: Normal range of motion.  Skin: Rash noted.  Psychiatric: She has a normal mood and affect. Her behavior is normal.  Breasts Increased macromastia without masses  Pitting at both shoulders and increased accessory breast tissue   Assessment/Plan Severe macromastia with accessory breast tissue ++ for  Bilateral breast reductions and excission of accessory breast tissue  Amani Marseille L, MD 08/15/2018, 7:28 AM

## 2018-08-15 NOTE — Op Note (Signed)
[name], [name] MEDICAL RECORD [id number] ACCOUNT [id number] DATE OF BIRTH:[birth year] FACILITY: [facility] LOCATION: [facility] PHYSICIAN:Francena Zender Preston FleetingL. Ahava Kissoon, MD  OPERATIVE REPORT  DATE OF PROCEDURE:  08/15/2018  INDICATIONS:  This is a 52 year old lady with severe-severe macromastia back and shoulder pain secondary to large pendulous breasts which demonstrates increased pitting, intertriginous changes, etc.  The patient also has increased accessory of breast  tissue.  PROCEDURES DONE:  Bilateral breast reductions using the inferior pedicle technique, excision of excessive breast tissue, severe.  ANESTHESIA:  General anesthesia.    DESCRIPTION OF PROCEDURE:  Preoperatively, the patient was set up and drawn for the reduction mammoplasty, inferior pedicle technique.  We marked the nipple areolar complex from over 42 cm to 26 on both sides.  After this, the patient underwent general  anesthesia and was intubated orally.  Prep was done to the chest, neck, breast, abdominal areas using Hibiclens soap and solution walled off with sterile towels and drapes so as to make a sterile field.  Then 500 mL of tumescent was injected on each  side, a total of 1000 mL.  This is allowed to sit up.  The wounds were scored with a #15 blade and then the skin of the inferior pedicle was deepithelialized with a #10 blade.  Medial and lateral fat to the pedicles were excised down the underlying  fascia.  After this out laterally, excessive amounts of severe sections of breast tissue was removed with some liposuction assistance.  After proper hemostasis, the flaps were transposed to stay with 3-0 Prolene suture.  Subcutaneous closure was done  with 3-0 Monocryl x2 levels and a running subcuticular stitch of 3-0 Monocryl.  The same procedure was carried on both sides removing over 1500 grams per side.  The wounds were drained with a #10 fully fluted Blake drain which was placed into the depths  of the wound and  brought out the lateral portion of the incision and secured with 3-0 Prolene.  Nipple areolar complexes were examined showing good supplements and blood supply.  Wounds were cleansed.  Steri-Strips and sterile dressings were applied.   Xeroform, 4 x 4's, ABDs and Hypafix tape.  She tolerated the procedure very well.  ESTIMATED BLOOD LOSS:  200 mL.  COMPLICATIONS:  None.  TN/NUANCE  D:08/15/2018 T:08/15/2018 JOB:[id number]

## 2018-08-15 NOTE — Anesthesia Procedure Notes (Signed)
Procedure Name: Intubation Performed by: [place] DesanctisLinka, Teola Felipe L, CRNA Pre-anesthesia Checklist: Patient identified, Emergency Drugs available, Suction available, Patient being monitored and Timeout performed Patient Re-evaluated:Patient Re-evaluated prior to induction Oxygen Delivery Method: Circle system utilized Preoxygenation: Pre-oxygenation with 100% oxygen Induction Type: IV induction Ventilation: Mask ventilation without difficulty Laryngoscope Size: Miller and 3 Tube type: Oral Tube size: 7.0 mm Number of attempts: 1 Airway Equipment and Method: Stylet and Oral airway Placement Confirmation: ETT inserted through vocal cords under direct vision,  positive ETCO2 and breath sounds checked- equal and bilateral Secured at: 22 cm Tube secured with: Tape Dental Injury: Teeth and Oropharynx as per pre-operative assessment

## 2018-08-15 NOTE — Brief Op Note (Signed)
08/15/2018  10:34 AM  PATIENT:  Ilsa IhaSharon Oglesby  52 y.o. female  PRE-OPERATIVE DIAGNOSIS:  macromastia  POST-OPERATIVE DIAGNOSIS:  macromastia  PROCEDURE:  Procedure(s): BILATERAL MAMMARY REDUCTION  (BREAST) (Bilateral)  SURGEON:  Surgeon(s) and Role:    * Louisa Secondruesdale, Layali Freund, MD - Primary  PHYSICIAN ASSISTANT:   ASSISTANTS: none   ANESTHESIA:   general  EBL:  40 mL   BLOOD ADMINISTERED:none  DRAINS: Penrose drain in the ruight and left breast ares   LOCAL MEDICATIONS USED:  XYLOCAINE   SPECIMEN:  Excision  DISPOSITION OF SPECIMEN:  PATHOLOGY  COUNTS:  YES  TOURNIQUET:  * No tourniquets in log *  DICTATION: .Other Dictation: Dictation Number [id number]  PLAN OF CARE: Discharge to home after PACU  PATIENT DISPOSITION:  PACU - hemodynamically stable.   Delay start of Pharmacological VTE agent (>24hrs) due to surgical blood loss or risk of bleeding: yes

## 2018-08-15 NOTE — Discharge Instructions (Signed)
** You had 10 MG of Oxycodone at 12:40pm  Breast Reduction Care After Refer to this sheet in the next few weeks. These instructions provide you with information on caring for yourself after your procedure. Your caregiver may also give you more specific instructions. Your treatment has been planned according to current medical practices, but problems sometimes occur. Call your caregiver if you have any problems or questions after your procedure. HOME CARE INSTRUCTIONS  Do not lift more than 5 pounds with one arm, or 10 pounds with both arms, for 1 month.   Do not sleep on your stomach for 4 to 6 weeks.   Do not do vigorous exercise such as bouncing, aerobics, or jumping for 6 weeks. Walking is not restricted.   Do not drive while you are taking prescription pain medicine.   Avoid prolonged sun exposure.   Keep dressings dry and clean  Measure jp drainage every 12 hrs and measure   You may slowly go back to your normal diet. Start with a light meal and increase as comfortable.   You may shower 24 hours after your drains are removed unless instructed differently by your caregiver.   Take your pain medicine as prescribed. Discomfort is normal after breast reduction surgery.   Keep the head of your bed elevated 40 degrees   : Call the office if you notice:  You have a fever.   You notice drainage from the incision that smells bad.   You have persistent pain.   You have persistent bleeding from the incision or nipple discharge.   You develop increased swelling or swelling that is greater in one breast than in the other.  MAKE SURE YOU:   Understand these instructions.   Will watch your condition.   Will get help right away if you are not doing well or get worse.  Document Released: 07/21/2004 Document Revised: 08/19/2011 Document Reviewed: 03/01/2008 [facility] Patient Information 2012 [place], [place].   Post Anesthesia Home Care Instructions  Activity: Get plenty of  rest for the remainder of the day. A responsible individual must stay with you for 24 hours following the procedure.  For the next 24 hours, DO NOT: -Drive a car -Advertising copywriter -Drink alcoholic beverages -Take any medication unless instructed by your physician -Make any legal decisions or sign important papers.  Meals: Start with liquid foods such as gelatin or soup. Progress to regular foods as tolerated. Avoid greasy, spicy, heavy foods. If nausea and/or vomiting occur, drink only clear liquids until the nausea and/or vomiting subsides. Call your physician if vomiting continues.  Special Instructions/Symptoms: Your throat may feel dry or sore from the anesthesia or the breathing tube placed in your throat during surgery. If this causes discomfort, gargle with warm salt water. The discomfort should disappear within 24 hours.  If you had a scopolamine patch placed behind your ear for the management of post- operative nausea and/or vomiting:  1. The medication in the patch is effective for 72 hours, after which it should be removed.  Wrap patch in a tissue and discard in the trash. Wash hands thoroughly with soap and water. 2. You may remove the patch earlier than 72 hours if you experience unpleasant side effects which may include dry mouth, dizziness or visual disturbances. 3. Avoid touching the patch. Wash your hands with soap and water after contact with the patch.        JP Drain [employer] this sheet to all of your post-operative appointments while  you have your drains.  Please measure your drains by CC's or ML's.  Make sure you drain and measure your JP Drains 2 or 3 times per day.  At the end of each day, add up totals for the left side and add up totals for the right side.    ( 9 am )     ( 3 pm )        ( 9 pm )                Date L  R  L  R  L  R  Total L/R

## 2018-08-15 NOTE — Transfer of Care (Signed)
Immediate Anesthesia Transfer of Care Note  Patient: Erin Jackson  Procedure(s) Performed: BILATERAL MAMMARY REDUCTION  (BREAST) (Bilateral Breast)  Patient Location: PACU  Anesthesia Type:General  Level of Consciousness: awake and patient cooperative  Airway & Oxygen Therapy: Patient Spontanous Breathing and Patient connected to face mask oxygen  Post-op Assessment: Report given to RN and Post -op Vital signs reviewed and stable  Post vital signs: Reviewed and stable  Last Vitals:  Vitals Value Taken Time  BP    Temp    Pulse    Resp    SpO2      Last Pain:  Vitals:   08/15/18 0650  TempSrc: Oral  PainSc: 10-Worst pain ever      Patients Stated Pain Goal: 7 (08/15/18 0650)  Complications: No apparent anesthesia complications

## 2018-08-16 ENCOUNTER — Encounter (HOSPITAL_BASED_OUTPATIENT_CLINIC_OR_DEPARTMENT_OTHER): Payer: Self-pay | Admitting: Specialist

## 2018-08-18 NOTE — Anesthesia Postprocedure Evaluation (Signed)
Anesthesia Post Note  Patient: Erin Jackson  Procedure(s) Performed: BILATERAL MAMMARY REDUCTION  (BREAST) (Bilateral Breast)     Patient location during evaluation: PACU Anesthesia Type: General Level of consciousness: awake Pain management: pain level controlled Vital Signs Assessment: post-procedure vital signs reviewed and stable Respiratory status: spontaneous breathing Cardiovascular status: stable Postop Assessment: no apparent nausea or vomiting Anesthetic complications: no    Last Vitals:  Vitals:   08/15/18 1241 08/15/18 1415  BP: (!) 149/96 (!) 169/95  Pulse: 100 (!) 102  Resp: (!) 22 (!) 22  Temp:  36.6 C  SpO2: 97% 100%    Last Pain:  Vitals:   08/16/18 0907  TempSrc:   PainSc: 5    Pain Goal: Patients Stated Pain Goal: 7 (08/15/18 0650)               Juana Montini JR,JOHN Susann GivensFRANKLIN

## 2018-09-05 DIAGNOSIS — M79642 Pain in left hand: Secondary | ICD-10-CM | POA: Diagnosis not present

## 2018-09-05 DIAGNOSIS — M546 Pain in thoracic spine: Secondary | ICD-10-CM | POA: Diagnosis not present

## 2018-09-05 DIAGNOSIS — M79641 Pain in right hand: Secondary | ICD-10-CM | POA: Diagnosis not present

## 2018-10-04 ENCOUNTER — Encounter (HOSPITAL_COMMUNITY): Payer: Self-pay | Admitting: Emergency Medicine

## 2018-10-04 ENCOUNTER — Emergency Department (HOSPITAL_COMMUNITY): Payer: Medicare Other

## 2018-10-04 ENCOUNTER — Other Ambulatory Visit: Payer: Self-pay

## 2018-10-04 ENCOUNTER — Emergency Department (HOSPITAL_COMMUNITY)
Admission: EM | Admit: 2018-10-04 | Discharge: 2018-10-04 | Disposition: A | Discharge status: Home / Self Care | Payer: Medicare Other | Attending: Emergency Medicine | Admitting: Emergency Medicine

## 2018-10-04 DIAGNOSIS — Z7984 Long term (current) use of oral hypoglycemic drugs: Secondary | ICD-10-CM | POA: Diagnosis not present

## 2018-10-04 DIAGNOSIS — I10 Essential (primary) hypertension: Secondary | ICD-10-CM | POA: Diagnosis not present

## 2018-10-04 DIAGNOSIS — Z79899 Other long term (current) drug therapy: Secondary | ICD-10-CM | POA: Diagnosis not present

## 2018-10-04 DIAGNOSIS — F17200 Nicotine dependence, unspecified, uncomplicated: Secondary | ICD-10-CM | POA: Insufficient documentation

## 2018-10-04 DIAGNOSIS — E119 Type 2 diabetes mellitus without complications: Secondary | ICD-10-CM | POA: Diagnosis not present

## 2018-10-04 DIAGNOSIS — R220 Localized swelling, mass and lump, head: Secondary | ICD-10-CM | POA: Diagnosis present

## 2018-10-04 DIAGNOSIS — T7840XA Allergy, unspecified, initial encounter: Secondary | ICD-10-CM

## 2018-10-04 LAB — CBC WITH DIFFERENTIAL/PLATELET
Abs Immature Granulocytes: 0.02 10*3/uL (ref 0.00–0.07)
Basophils Absolute: 0 10*3/uL (ref 0.0–0.1)
Basophils Relative: 0 %
Eosinophils Absolute: 0 10*3/uL (ref 0.0–0.5)
Eosinophils Relative: 0 %
HCT: 38.3 % (ref 36.0–46.0)
Hemoglobin: 12 g/dL (ref 12.0–15.0)
Immature Granulocytes: 0 %
Lymphocytes Relative: 63 %
Lymphs Abs: 6.1 10*3/uL — ABNORMAL HIGH (ref 0.7–4.0)
MCH: 28 pg (ref 26.0–34.0)
MCHC: 31.3 g/dL (ref 30.0–36.0)
MCV: 89.3 fL (ref 80.0–100.0)
Monocytes Absolute: 0.4 10*3/uL (ref 0.1–1.0)
Monocytes Relative: 4 %
Neutro Abs: 3.2 10*3/uL (ref 1.7–7.7)
Neutrophils Relative %: 33 %
Platelets: 242 10*3/uL (ref 150–400)
RBC: 4.29 MIL/uL (ref 3.87–5.11)
RDW: 13.6 % (ref 11.5–15.5)
WBC: 9.8 10*3/uL (ref 4.0–10.5)
nRBC: 0 % (ref 0.0–0.2)

## 2018-10-04 LAB — BASIC METABOLIC PANEL
Anion gap: 9 (ref 5–15)
BUN: 22 mg/dL — ABNORMAL HIGH (ref 6–20)
CO2: 21 mmol/L — ABNORMAL LOW (ref 22–32)
Calcium: 9.3 mg/dL (ref 8.9–10.3)
Chloride: 104 mmol/L (ref 98–111)
Creatinine, Ser: 1.06 mg/dL — ABNORMAL HIGH (ref 0.44–1.00)
GFR calc Af Amer: >60 mL/min (ref >60)
GFR calc non Af Amer: 58 mL/min — ABNORMAL LOW (ref >60)
Glucose, Bld: 114 mg/dL — ABNORMAL HIGH (ref 70–99)
Potassium: 4.6 mmol/L (ref 3.5–5.1)
Sodium: 134 mmol/L — ABNORMAL LOW (ref 135–145)

## 2018-10-04 LAB — I-STAT BETA HCG BLOOD, ED (MC, WL, AP ONLY): I-stat hCG, quantitative: <5 m[IU]/mL (ref <5)

## 2018-10-04 MED ORDER — FAMOTIDINE IN NACL 20-0.9 MG/50ML-% IV SOLN
20.0000 mg | Freq: Once | INTRAVENOUS | Status: AC
  Administered 2018-10-04: 20 mg via INTRAVENOUS
  Filled 2018-10-04: qty 50

## 2018-10-04 MED ORDER — SODIUM CHLORIDE 0.9 % IV BOLUS
1000.0000 mL | Freq: Once | INTRAVENOUS | Status: AC
  Administered 2018-10-04: 1000 mL via INTRAVENOUS

## 2018-10-04 MED ORDER — DIPHENHYDRAMINE HCL 50 MG/ML IJ SOLN
INTRAMUSCULAR | Status: AC
  Filled 2018-10-04: qty 1

## 2018-10-04 MED ORDER — EPINEPHRINE 0.3 MG/0.3ML IJ SOAJ: 0.3000 mg | Freq: Once | INTRAMUSCULAR | 1 refills | Status: AC | PRN

## 2018-10-04 MED ORDER — EPINEPHRINE 0.3 MG/0.3ML IJ SOAJ
INTRAMUSCULAR | Status: AC
  Administered 2018-10-04: 1 mg
  Filled 2018-10-04: qty 0.3

## 2018-10-04 MED ORDER — FAMOTIDINE 20 MG PO TABS: 20.0000 mg | ORAL_TABLET | Freq: Once | ORAL | Status: DC

## 2018-10-04 MED ORDER — EPINEPHRINE 0.3 MG/0.3ML IJ SOAJ: 0.3000 mg | Freq: Once | INTRAMUSCULAR | Status: DC

## 2018-10-04 MED ORDER — ONDANSETRON HCL 4 MG/2ML IJ SOLN
4.0000 mg | Freq: Once | INTRAMUSCULAR | Status: AC
Start: 2018-10-04 — End: 2018-10-04
  Administered 2018-10-04: 4 mg via INTRAVENOUS
  Filled 2018-10-04: qty 2

## 2018-10-04 MED ORDER — METHYLPREDNISOLONE SODIUM SUCC 125 MG IJ SOLR
INTRAMUSCULAR | Status: AC
  Administered 2018-10-04: 125 mg
  Filled 2018-10-04: qty 2

## 2018-10-04 NOTE — ED Notes (Signed)
Patient ambulatory to bathroom with steady gait at this time 

## 2018-10-04 NOTE — Discharge Instructions (Addendum)
Follow up with primary care doctor in 2 days.  Do not eat apples.  Return to the ED for any worsening or other concerns.  Take benadryl as needed and epi pen for any concerning allergic reaction.

## 2018-10-04 NOTE — ED Triage Notes (Signed)
Pt presents with facial swelling, itching throat since 3p when she ate an apple; no known allergies to apple

## 2018-10-04 NOTE — ED Notes (Signed)
Pt up to restroom with steady gait noted, toileting independently

## 2018-10-04 NOTE — ED Provider Notes (Signed)
[facility] EMERGENCY DEPARTMENT Provider Note   CSN: [id number] Arrival date & time: 10/04/18  1529     History   Chief Complaint Chief Complaint  Patient presents with  . Allergic Reaction    HPI Erin Jackson is a 52 y.o. female.  HPI   Patient is a 52yo female with PMHx of DM, GERD, and HTN who presents with allergic reaction to an apple she ate at 1500 today.  Patient had no improvement with benadryl 50mg .  She reports facial swelling to eyes and mouth, itchy throat, difficulty swallowing, SOB, and nausea.  No rash, vomiting, or abdominal pain.  She believes her symptoms are worsening.  She has history of multiple allergies including tree nuts, cucumber, ASA, and ibuprofen that has required an EpiPen in the past.  She was unable to use her EpiPen as it expired.  No prior ICU stays or intubations.  Otherwise in normal state of health earlier today.  Past Medical History:  Diagnosis Date  . Diabetes mellitus without complication (HCC)   . GERD (gastroesophageal reflux disease)   . Hypertension     Patient Active Problem List   Diagnosis Date Noted  . Chronic neck pain 08/20/2017  . Supraclavicular mass 08/20/2017  . Hypertension associated with diabetes (HCC) 08/20/2017  . T2DM (type 2 diabetes mellitus) (HCC) 08/20/2017  . GERD (gastroesophageal reflux disease) 08/20/2017  . Cigarette nicotine dependence without complication 08/20/2017    Past Surgical History:  Procedure Laterality Date  . ABDOMINAL HYSTERECTOMY    . BREAST REDUCTION SURGERY Bilateral 08/15/2018   Procedure: BILATERAL MAMMARY REDUCTION  (BREAST);  Surgeon: Louisa Second, MD;  Location: [facility];  Service: Plastics;  Laterality: Bilateral;  . CERVICAL SPINE SURGERY  2015   has a metal plate  . FOOT SURGERY Left 2014  . HAND SURGERY       OB History   None      Home Medications    Prior to Admission medications   Medication Sig Start Date End Date  Taking? Authorizing Provider  alprazolam Prudy Feeler) 2 MG tablet Take 2 mg by mouth 2 (two) times daily as needed for sleep.    [provider]  cloNIDine (CATAPRES) 0.2 MG tablet Take 0.4 mg by mouth at bedtime.    [provider]  diphenhydrAMINE (BENADRYL) 25 mg capsule Take 25 mg by mouth every 6 (six) hours as needed for itching.    [provider]  EPINEPHRINE 0.3 mg/0.3 mL IJ SOAJ injection Inject 0.3 mLs (0.3 mg total) into the muscle once as needed (allergic reaction). 10/04/18   Abelardo Diesel, MD  fluticasone (FLONASE) 50 MCG/ACT nasal spray Place 2 sprays into both nostrils daily. Patient not taking: Reported on 01/26/2018 03/13/17   Burgess Amor, PA-C  metFORMIN (GLUCOPHAGE) 500 MG tablet Take 500 mg by mouth 2 (two) times daily with a meal.    [provider]  omeprazole (PRILOSEC) 20 MG capsule Take 1 capsule (20 mg total) by mouth daily. 11/01/16   Pricilla Loveless, MD  traZODone (DESYREL) 150 MG tablet Take 150 mg by mouth at bedtime.    [provider]    Family History No family history on file.  Social History Social History   Tobacco Use  . Smoking status: Current Every Day Smoker  . Smokeless tobacco: Never Used  . Tobacco comment: one pack every 3 days  Substance Use Topics  . Alcohol use: Yes    Comment: socially  . Drug  use: Yes    Types: Marijuana    Comment: 2 x's a week for pain  last used 08/12/2018     Allergies   Citrus; Cucumber extract; Norvasc [amlodipine]; Other; Aspirin; Ibuprofen; and Niacin and related   Review of Systems Review of Systems  Constitutional: Negative for chills and fever.  HENT: Positive for facial swelling. Negative for sore throat.   Eyes: Negative for pain and visual disturbance.  Respiratory: Positive for chest tightness and shortness of breath. Negative for cough.   Cardiovascular: Negative for chest pain and palpitations.  Gastrointestinal: Positive for nausea. Negative for abdominal  pain, diarrhea and vomiting.  Genitourinary: Negative for dysuria and hematuria.  Musculoskeletal: Negative for arthralgias and back pain.  Skin: Negative for color change and rash.  Neurological: Negative for seizures and syncope.  All other systems reviewed and are negative.    Physical Exam Updated Vital Signs BP 113/64   Pulse 93   Resp (!) 21   SpO2 96%   Physical Exam  Constitutional: She is oriented to person, place, and time. She appears well-developed and well-nourished. No distress.  HENT:  Head: Normocephalic and atraumatic.  Mouth/Throat: Oropharynx is clear and moist.  Lip swelling.  Eyes: Pupils are equal, round, and reactive to light. Conjunctivae and EOM are normal.  Periorbital edema bilaterally.  Neck: Normal range of motion. Neck supple. No tracheal deviation present.  Cardiovascular: Normal rate, regular rhythm and intact distal pulses.  Pulmonary/Chest: Breath sounds normal. No stridor. No respiratory distress. She has no wheezes.  Mild tachypnea.  Abdominal: Soft. She exhibits no distension. There is no tenderness. There is no guarding.  Musculoskeletal: Normal range of motion.  Neurological: She is alert and oriented to person, place, and time.  Skin: Skin is warm and dry. Capillary refill takes less than 2 seconds. No rash noted.  Psychiatric: She has a normal mood and affect.  Nursing note and vitals reviewed.    ED Treatments / Results  Labs (all labs ordered are listed, but only abnormal results are displayed) Labs Reviewed  CBC WITH DIFFERENTIAL/PLATELET - Abnormal; Notable for the following components:      Result Value   Lymphs Abs 6.1 (*)    All other components within normal limits  BASIC METABOLIC PANEL - Abnormal; Notable for the following components:   Sodium 134 (*)    CO2 21 (*)    Glucose, Bld 114 (*)    BUN 22 (*)    Creatinine, Ser 1.06 (*)    GFR calc non Af Amer 58 (*)    All other components within normal limits  I-STAT  BETA HCG BLOOD, ED ([facility], [facility], [facility] ONLY)    EKG EKG Interpretation  Date/Time:  Tuesday October 04 2018 15:54:56 EDT Ventricular Rate:  89 PR Interval:    QRS Duration: 86 QT Interval:  372 QTC Calculation: 453 R Axis:   33 Text Interpretation:  Normal sinus rhythm Low voltage, precordial leads Baseline wander in lead(s) V3 V5 No significant change since last tracing Confirmed by Frederick Peers [phone]) on 10/04/2018 4:00:21 PM   Radiology Dg Chest Port 1 View  Result Date: 10/04/2018 CLINICAL DATA:  Shortness of Breath EXAM: PORTABLE CHEST 1 VIEW COMPARISON:  07/30/2017 FINDINGS: The heart size and mediastinal contours are within normal limits. Both lungs are clear. The visualized skeletal structures shows postsurgical changes in the cervical spine. IMPRESSION: No active disease. Electronically Signed   By: Alcide Clever M.D.   On: 10/04/2018 16:13  Procedures Procedures (including critical care time)  Medications Ordered in ED Medications  methylPREDNISolone sodium succinate (SOLU-MEDROL) 125 mg/2 mL injection (125 mg  Given 10/04/18 1550)  EPINEPHrine (EPI-PEN) 0.3 mg/0.3 mL injection (1 mg  Given 10/04/18 1550)  ondansetron (ZOFRAN) injection 4 mg (4 mg Intravenous Given 10/04/18 1558)  sodium chloride 0.9 % bolus 1,000 mL (0 mLs Intravenous Stopped 10/04/18 1838)  famotidine (PEPCID) IVPB 20 mg premix (0 mg Intravenous Stopped 10/04/18 1647)     Initial Impression / Assessment and Plan / ED Course  I have reviewed the triage vital signs and the nursing notes.  Pertinent labs & imaging results that were available during my care of the patient were reviewed by me and considered in my medical decision making (see chart for details).     Erin Jackson is a 52 y.o. female with PMHx of DM, GERD, and HTN who presents with allergic reaction to an apple she ate at 1500 today.  On arrival she is in mild distress.  VS WNL.  Not hypotensive.  Exam as above with facial swelling and mild  tachypnea.  No rash, tongue swelling, voice change, or wheezing appreciated.  Abdomen benign.    Clinical picture is consistent with anaphylaxis reaction given the above (2/4 systems) therefore will give IM epi 0.3mg .  As patient took benadryl 50mg  PTA will give zantac, steroid, and zofran.  Basic labs obtained and unremarkable.  CXR without acute cardiopulmonary process.  Will observe for 4 hours and reassess.  After 4 hour observation, patient with significant improvement. No recurrence.  Patient without wheezing, respiratory complaints, facial swelling, rash, or GI upset.   She remained HDS throughout.  Stable for d/c home.  Will Rx EpiPen and instruct patient to avoid apples.  Old records reviewed.  Imaging and labs reviewed and interpreted by myself and attending and used in the MDM.  Addressed patient question and concerns.  Reviewed discharged instructions with strict precautions given.  Advised patient to schedule follow-up with primary care provider.  Patient verbalized understanding and agrees with plan.  Patient stable at discharge.  The plan for this patient was discussed with Dr. Clarene Duke who voiced agreement and who oversaw evaluation and treatment of this patient.   Final Clinical Impressions(s) / ED Diagnoses   Final diagnoses:  Allergic reaction, initial encounter    ED Discharge Orders         Ordered    EPINEPHRINE 0.3 mg/0.3 mL IJ SOAJ injection  Once PRN     10/04/18 1938           Abelardo Diesel, MD 10/05/18 1202    Little, Ambrose Finland, MD 10/05/18 1430

## 2018-10-04 NOTE — ED Notes (Signed)
ED resident at bedside.

## 2018-11-24 ENCOUNTER — Ambulatory Visit: Payer: Medicare HMO | Admitting: Allergy

## 2019-07-11 ENCOUNTER — Other Ambulatory Visit: Payer: Self-pay | Admitting: Family

## 2019-07-11 DIAGNOSIS — N6310 Unspecified lump in the right breast, unspecified quadrant: Secondary | ICD-10-CM

## 2019-07-25 ENCOUNTER — Ambulatory Visit
Admission: RE | Admit: 2019-07-25 | Discharge: 2019-07-25 | Disposition: A | Discharge status: Home / Self Care | Payer: Medicare HMO | Source: Ambulatory Visit | Attending: Family | Admitting: Family

## 2019-07-25 ENCOUNTER — Other Ambulatory Visit: Payer: Self-pay | Admitting: Family

## 2019-07-25 ENCOUNTER — Other Ambulatory Visit: Payer: Medicare Other

## 2019-07-25 ENCOUNTER — Other Ambulatory Visit: Payer: Self-pay

## 2019-07-25 DIAGNOSIS — N6320 Unspecified lump in the left breast, unspecified quadrant: Secondary | ICD-10-CM

## 2019-07-25 DIAGNOSIS — N6310 Unspecified lump in the right breast, unspecified quadrant: Secondary | ICD-10-CM

## 2019-09-13 ENCOUNTER — Other Ambulatory Visit: Payer: Self-pay

## 2019-09-13 ENCOUNTER — Encounter (HOSPITAL_BASED_OUTPATIENT_CLINIC_OR_DEPARTMENT_OTHER): Payer: Self-pay | Admitting: *Deleted

## 2019-09-14 ENCOUNTER — Other Ambulatory Visit (HOSPITAL_COMMUNITY): Payer: Medicare HMO

## 2019-09-21 ENCOUNTER — Other Ambulatory Visit (HOSPITAL_COMMUNITY)
Admission: RE | Admit: 2019-09-21 | Discharge: 2019-09-21 | Disposition: A | Discharge status: Home / Self Care | Payer: Medicare HMO | Source: Ambulatory Visit | Attending: Specialist | Admitting: Specialist

## 2019-09-21 ENCOUNTER — Encounter (HOSPITAL_BASED_OUTPATIENT_CLINIC_OR_DEPARTMENT_OTHER)
Admission: RE | Admit: 2019-09-21 | Discharge: 2019-09-21 | Disposition: A | Discharge status: Home / Self Care | Payer: Medicare HMO | Source: Ambulatory Visit | Attending: Specialist | Admitting: Specialist

## 2019-09-21 ENCOUNTER — Other Ambulatory Visit: Payer: Self-pay

## 2019-09-21 ENCOUNTER — Other Ambulatory Visit (HOSPITAL_COMMUNITY): Payer: Medicare HMO

## 2019-09-21 DIAGNOSIS — F17210 Nicotine dependence, cigarettes, uncomplicated: Secondary | ICD-10-CM | POA: Diagnosis not present

## 2019-09-21 DIAGNOSIS — G4730 Sleep apnea, unspecified: Secondary | ICD-10-CM | POA: Diagnosis not present

## 2019-09-21 DIAGNOSIS — N6032 Fibrosclerosis of left breast: Secondary | ICD-10-CM | POA: Diagnosis not present

## 2019-09-21 DIAGNOSIS — Z886 Allergy status to analgesic agent status: Secondary | ICD-10-CM | POA: Diagnosis not present

## 2019-09-21 DIAGNOSIS — K219 Gastro-esophageal reflux disease without esophagitis: Secondary | ICD-10-CM | POA: Diagnosis not present

## 2019-09-21 DIAGNOSIS — Z7984 Long term (current) use of oral hypoglycemic drugs: Secondary | ICD-10-CM | POA: Diagnosis not present

## 2019-09-21 DIAGNOSIS — Z79899 Other long term (current) drug therapy: Secondary | ICD-10-CM | POA: Diagnosis not present

## 2019-09-21 DIAGNOSIS — N641 Fat necrosis of breast: Secondary | ICD-10-CM | POA: Diagnosis not present

## 2019-09-21 DIAGNOSIS — I10 Essential (primary) hypertension: Secondary | ICD-10-CM | POA: Diagnosis not present

## 2019-09-21 DIAGNOSIS — Z6838 Body mass index (BMI) 38.0-38.9, adult: Secondary | ICD-10-CM | POA: Diagnosis not present

## 2019-09-21 DIAGNOSIS — Z888 Allergy status to other drugs, medicaments and biological substances status: Secondary | ICD-10-CM | POA: Diagnosis not present

## 2019-09-21 DIAGNOSIS — Z20828 Contact with and (suspected) exposure to other viral communicable diseases: Secondary | ICD-10-CM | POA: Diagnosis present

## 2019-09-21 DIAGNOSIS — F419 Anxiety disorder, unspecified: Secondary | ICD-10-CM | POA: Diagnosis not present

## 2019-09-21 DIAGNOSIS — E119 Type 2 diabetes mellitus without complications: Secondary | ICD-10-CM | POA: Diagnosis not present

## 2019-09-21 DIAGNOSIS — N61 Mastitis without abscess: Secondary | ICD-10-CM | POA: Diagnosis not present

## 2019-09-21 LAB — BASIC METABOLIC PANEL
Anion gap: 10 (ref 5–15)
BUN: 13 mg/dL (ref 6–20)
CO2: 23 mmol/L (ref 22–32)
Calcium: 8.7 mg/dL — ABNORMAL LOW (ref 8.9–10.3)
Chloride: 102 mmol/L (ref 98–111)
Creatinine, Ser: 0.93 mg/dL (ref 0.44–1.00)
GFR calc Af Amer: >60 mL/min (ref >60)
GFR calc non Af Amer: >60 mL/min (ref >60)
Glucose, Bld: 192 mg/dL — ABNORMAL HIGH (ref 70–99)
Potassium: 4.3 mmol/L (ref 3.5–5.1)
Sodium: 135 mmol/L (ref 135–145)

## 2019-09-22 LAB — NOVEL CORONAVIRUS, NAA (HOSP ORDER, SEND-OUT TO REF LAB; TAT 18-24 HRS): SARS-CoV-2, NAA: NOT DETECTED

## 2019-09-24 NOTE — Anesthesia Preprocedure Evaluation (Addendum)
Anesthesia Evaluation  Patient identified by MRN, date of birth, ID band Patient awake    Reviewed: Allergy & Precautions, NPO status , Patient's Chart, lab work & pertinent test results  Airway Mallampati: I       Dental no notable dental hx. (+) Teeth Intact   Pulmonary sleep apnea , Current Smoker and Patient abstained from smoking.,    Pulmonary exam normal breath sounds clear to auscultation       Cardiovascular hypertension, Normal cardiovascular exam Rhythm:Regular Rate:Normal     Neuro/Psych PSYCHIATRIC DISORDERS Anxiety negative neurological ROS     GI/Hepatic Neg liver ROS, GERD  Medicated,  Endo/Other  diabetes, Type 2, Oral Hypoglycemic AgentsMorbid obesity  Renal/GU negative Renal ROS     Musculoskeletal negative musculoskeletal ROS (+)   Abdominal (+) + obese,   Peds  Hematology negative hematology ROS (+)   Anesthesia Other Findings   Reproductive/Obstetrics                            Anesthesia Physical  Anesthesia Plan  ASA: III  Anesthesia Plan: General   Post-op Pain Management:    Induction: Intravenous  PONV Risk Score and Plan: 3 and Ondansetron, Dexamethasone and Midazolam  Airway Management Planned: LMA and Oral ETT  Additional Equipment: None  Intra-op Plan:   Post-operative Plan: Extubation in OR  Informed Consent: I have reviewed the patients History and Physical, chart, labs and discussed the procedure including the risks, benefits and alternatives for the proposed anesthesia with the patient or authorized representative who has indicated his/her understanding and acceptance.       Plan Discussed with: CRNA, Surgeon and Anesthesiologist  Anesthesia Plan Comments: (  )       Anesthesia Quick Evaluation

## 2019-09-25 ENCOUNTER — Ambulatory Visit (HOSPITAL_BASED_OUTPATIENT_CLINIC_OR_DEPARTMENT_OTHER)
Admission: RE | Admit: 2019-09-25 | Discharge: 2019-09-25 | Disposition: A | Discharge status: Home / Self Care | Payer: Medicare HMO | Attending: Specialist | Admitting: Specialist

## 2019-09-25 ENCOUNTER — Encounter (HOSPITAL_BASED_OUTPATIENT_CLINIC_OR_DEPARTMENT_OTHER): Payer: Self-pay | Admitting: *Deleted

## 2019-09-25 ENCOUNTER — Encounter (HOSPITAL_BASED_OUTPATIENT_CLINIC_OR_DEPARTMENT_OTHER)
Admission: RE | Disposition: A | Discharge status: Home / Self Care | Payer: Self-pay | Source: Home / Self Care | Attending: Specialist

## 2019-09-25 ENCOUNTER — Other Ambulatory Visit: Payer: Self-pay

## 2019-09-25 ENCOUNTER — Ambulatory Visit (HOSPITAL_BASED_OUTPATIENT_CLINIC_OR_DEPARTMENT_OTHER): Payer: Medicare HMO | Admitting: Anesthesiology

## 2019-09-25 DIAGNOSIS — Z7984 Long term (current) use of oral hypoglycemic drugs: Secondary | ICD-10-CM | POA: Insufficient documentation

## 2019-09-25 DIAGNOSIS — N641 Fat necrosis of breast: Secondary | ICD-10-CM | POA: Insufficient documentation

## 2019-09-25 DIAGNOSIS — F419 Anxiety disorder, unspecified: Secondary | ICD-10-CM | POA: Diagnosis not present

## 2019-09-25 DIAGNOSIS — K219 Gastro-esophageal reflux disease without esophagitis: Secondary | ICD-10-CM | POA: Insufficient documentation

## 2019-09-25 DIAGNOSIS — F17210 Nicotine dependence, cigarettes, uncomplicated: Secondary | ICD-10-CM | POA: Insufficient documentation

## 2019-09-25 DIAGNOSIS — E119 Type 2 diabetes mellitus without complications: Secondary | ICD-10-CM | POA: Insufficient documentation

## 2019-09-25 DIAGNOSIS — N6032 Fibrosclerosis of left breast: Secondary | ICD-10-CM | POA: Insufficient documentation

## 2019-09-25 DIAGNOSIS — I10 Essential (primary) hypertension: Secondary | ICD-10-CM | POA: Insufficient documentation

## 2019-09-25 DIAGNOSIS — Z79899 Other long term (current) drug therapy: Secondary | ICD-10-CM | POA: Insufficient documentation

## 2019-09-25 DIAGNOSIS — N61 Mastitis without abscess: Secondary | ICD-10-CM | POA: Diagnosis not present

## 2019-09-25 DIAGNOSIS — G4730 Sleep apnea, unspecified: Secondary | ICD-10-CM | POA: Insufficient documentation

## 2019-09-25 DIAGNOSIS — Z886 Allergy status to analgesic agent status: Secondary | ICD-10-CM | POA: Insufficient documentation

## 2019-09-25 DIAGNOSIS — Z888 Allergy status to other drugs, medicaments and biological substances status: Secondary | ICD-10-CM | POA: Insufficient documentation

## 2019-09-25 DIAGNOSIS — Z6838 Body mass index (BMI) 38.0-38.9, adult: Secondary | ICD-10-CM | POA: Insufficient documentation

## 2019-09-25 HISTORY — DX: Other chronic pain: M54.9

## 2019-09-25 HISTORY — DX: Sleep apnea, unspecified: G47.30

## 2019-09-25 HISTORY — PX: BREAST REDUCTION SURGERY: SHX8

## 2019-09-25 HISTORY — DX: Other chronic pain: G89.29

## 2019-09-25 HISTORY — DX: Anxiety disorder, unspecified: F41.9

## 2019-09-25 LAB — GLUCOSE, CAPILLARY
Glucose-Capillary: 173 mg/dL — ABNORMAL HIGH (ref 70–99)
Glucose-Capillary: 181 mg/dL — ABNORMAL HIGH (ref 70–99)

## 2019-09-25 SURGERY — MAMMOPLASTY, REDUCTION
Anesthesia: General | Site: Breast | Laterality: Left

## 2019-09-25 MED ORDER — DEXAMETHASONE SODIUM PHOSPHATE 10 MG/ML IJ SOLN
INTRAMUSCULAR | Status: AC
  Filled 2019-09-25: qty 1

## 2019-09-25 MED ORDER — PHENYLEPHRINE HCL (PRESSORS) 10 MG/ML IV SOLN
INTRAVENOUS | Status: AC
  Filled 2019-09-25: qty 1

## 2019-09-25 MED ORDER — LIDOCAINE HCL (CARDIAC) PF 100 MG/5ML IV SOSY
PREFILLED_SYRINGE | INTRAVENOUS | Status: DC | PRN
  Administered 2019-09-25: 75 mg via INTRAVENOUS

## 2019-09-25 MED ORDER — ACETAMINOPHEN 10 MG/ML IV SOLN
1000.0000 mg | Freq: Once | INTRAVENOUS | Status: AC
  Administered 2019-09-25: 1000 mg via INTRAVENOUS

## 2019-09-25 MED ORDER — PROPOFOL 10 MG/ML IV BOLUS
INTRAVENOUS | Status: AC
  Filled 2019-09-25: qty 20

## 2019-09-25 MED ORDER — SCOPOLAMINE 1 MG/3DAYS TD PT72: 1.0000 | MEDICATED_PATCH | Freq: Once | TRANSDERMAL | Status: DC

## 2019-09-25 MED ORDER — ONDANSETRON HCL 4 MG/2ML IJ SOLN: 4.0000 mg | Freq: Once | INTRAMUSCULAR | Status: DC | PRN

## 2019-09-25 MED ORDER — LACTATED RINGERS IV SOLN
INTRAVENOUS | Status: AC | PRN
  Administered 2019-09-25: 50 mL via INTRAVENOUS

## 2019-09-25 MED ORDER — DEXAMETHASONE SODIUM PHOSPHATE 4 MG/ML IJ SOLN
INTRAMUSCULAR | Status: DC | PRN
  Administered 2019-09-25: 10 mg via INTRAVENOUS

## 2019-09-25 MED ORDER — METHYLPREDNISOLONE ACETATE 40 MG/ML IJ SUSP
INTRAMUSCULAR | Status: AC
  Filled 2019-09-25: qty 1

## 2019-09-25 MED ORDER — EPINEPHRINE PF 1 MG/ML IJ SOLN
INTRAMUSCULAR | Status: AC
  Filled 2019-09-25: qty 4

## 2019-09-25 MED ORDER — FENTANYL CITRATE (PF) 100 MCG/2ML IJ SOLN
25.0000 ug | INTRAMUSCULAR | Status: DC | PRN
  Administered 2019-09-25: 50 ug via INTRAVENOUS
  Administered 2019-09-25: 25 ug via INTRAVENOUS

## 2019-09-25 MED ORDER — ONDANSETRON HCL 4 MG/2ML IJ SOLN
INTRAMUSCULAR | Status: AC
  Filled 2019-09-25: qty 2

## 2019-09-25 MED ORDER — SODIUM BICARBONATE 4.2 % IV SOLN
INTRAVENOUS | Status: AC
  Filled 2019-09-25: qty 20

## 2019-09-25 MED ORDER — PROPOFOL 10 MG/ML IV BOLUS
INTRAVENOUS | Status: DC | PRN
  Administered 2019-09-25: 150 mg via INTRAVENOUS

## 2019-09-25 MED ORDER — MIDAZOLAM HCL 2 MG/2ML IJ SOLN
INTRAMUSCULAR | Status: AC
  Filled 2019-09-25: qty 2

## 2019-09-25 MED ORDER — LIDOCAINE-EPINEPHRINE 0.5 %-1:200000 IJ SOLN
INTRAMUSCULAR | Status: DC | PRN
  Administered 2019-09-25: 50 mL

## 2019-09-25 MED ORDER — BUPIVACAINE-EPINEPHRINE (PF) 0.5% -1:200000 IJ SOLN
INTRAMUSCULAR | Status: AC
  Filled 2019-09-25: qty 60

## 2019-09-25 MED ORDER — MIDAZOLAM HCL 5 MG/5ML IJ SOLN
INTRAMUSCULAR | Status: DC | PRN
  Administered 2019-09-25: 2 mg via INTRAVENOUS

## 2019-09-25 MED ORDER — PHENYLEPHRINE HCL (PRESSORS) 10 MG/ML IV SOLN
INTRAVENOUS | Status: DC | PRN
  Administered 2019-09-25 (×5): 80 ug via INTRAVENOUS

## 2019-09-25 MED ORDER — METHYLPREDNISOLONE ACETATE 80 MG/ML IJ SUSP
INTRAMUSCULAR | Status: AC
  Filled 2019-09-25: qty 1

## 2019-09-25 MED ORDER — OXYCODONE HCL 5 MG/5ML PO SOLN: 5.0000 mg | Freq: Once | ORAL | Status: AC | PRN

## 2019-09-25 MED ORDER — FENTANYL CITRATE (PF) 100 MCG/2ML IJ SOLN: 50.0000 ug | INTRAMUSCULAR | Status: DC | PRN

## 2019-09-25 MED ORDER — CEFAZOLIN SODIUM-DEXTROSE 2-4 GM/100ML-% IV SOLN
INTRAVENOUS | Status: AC
  Filled 2019-09-25: qty 100

## 2019-09-25 MED ORDER — ACETAMINOPHEN 160 MG/5ML PO SOLN: 325.0000 mg | ORAL | Status: DC | PRN

## 2019-09-25 MED ORDER — OXYCODONE HCL 5 MG PO TABS
ORAL_TABLET | ORAL | Status: AC
  Filled 2019-09-25: qty 1

## 2019-09-25 MED ORDER — PHENYLEPHRINE 40 MCG/ML (10ML) SYRINGE FOR IV PUSH (FOR BLOOD PRESSURE SUPPORT)
PREFILLED_SYRINGE | INTRAVENOUS | Status: AC
  Filled 2019-09-25: qty 20

## 2019-09-25 MED ORDER — SODIUM CHLORIDE 0.9 % IV SOLN: INTRAVENOUS | Status: DC | PRN

## 2019-09-25 MED ORDER — CHLORHEXIDINE GLUCONATE CLOTH 2 % EX PADS: 6.0000 | MEDICATED_PAD | Freq: Once | CUTANEOUS | Status: DC

## 2019-09-25 MED ORDER — LIDOCAINE HCL 2 % IJ SOLN
INTRAMUSCULAR | Status: AC
  Filled 2019-09-25: qty 100

## 2019-09-25 MED ORDER — PHENYLEPHRINE 40 MCG/ML (10ML) SYRINGE FOR IV PUSH (FOR BLOOD PRESSURE SUPPORT)
PREFILLED_SYRINGE | INTRAVENOUS | Status: AC
  Filled 2019-09-25: qty 10

## 2019-09-25 MED ORDER — LACTATED RINGERS IV SOLN
INTRAVENOUS | Status: DC
  Administered 2019-09-25 (×2): via INTRAVENOUS

## 2019-09-25 MED ORDER — FENTANYL CITRATE (PF) 100 MCG/2ML IJ SOLN
INTRAMUSCULAR | Status: AC
  Filled 2019-09-25: qty 2

## 2019-09-25 MED ORDER — LIDOCAINE-EPINEPHRINE 0.5 %-1:200000 IJ SOLN
INTRAMUSCULAR | Status: AC
  Filled 2019-09-25: qty 2

## 2019-09-25 MED ORDER — ACETAMINOPHEN 325 MG PO TABS: 325.0000 mg | ORAL_TABLET | ORAL | Status: DC | PRN

## 2019-09-25 MED ORDER — MIDAZOLAM HCL 2 MG/2ML IJ SOLN: 1.0000 mg | INTRAMUSCULAR | Status: DC | PRN

## 2019-09-25 MED ORDER — MEPERIDINE HCL 25 MG/ML IJ SOLN: 6.2500 mg | INTRAMUSCULAR | Status: DC | PRN

## 2019-09-25 MED ORDER — FENTANYL CITRATE (PF) 100 MCG/2ML IJ SOLN
INTRAMUSCULAR | Status: DC | PRN
  Administered 2019-09-25 (×2): 50 ug via INTRAVENOUS

## 2019-09-25 MED ORDER — OXYCODONE HCL 5 MG PO TABS
5.0000 mg | ORAL_TABLET | Freq: Once | ORAL | Status: AC | PRN
  Administered 2019-09-25: 5 mg via ORAL

## 2019-09-25 MED ORDER — ACETAMINOPHEN 10 MG/ML IV SOLN
INTRAVENOUS | Status: AC
  Filled 2019-09-25: qty 100

## 2019-09-25 MED ORDER — CEFAZOLIN SODIUM-DEXTROSE 2-4 GM/100ML-% IV SOLN
2.0000 g | INTRAVENOUS | Status: AC
  Administered 2019-09-25: 2 g via INTRAVENOUS

## 2019-09-25 MED ORDER — LIDOCAINE 2% (20 MG/ML) 5 ML SYRINGE
INTRAMUSCULAR | Status: AC
  Filled 2019-09-25: qty 5

## 2019-09-25 SURGICAL SUPPLY — 67 items
BAG DECANTER FOR FLEXI CONT (MISCELLANEOUS) ×3 IMPLANT
BENZOIN TINCTURE PRP APPL 2/3 (GAUZE/BANDAGES/DRESSINGS) ×4 IMPLANT
BINDER BREAST XLRG (GAUZE/BANDAGES/DRESSINGS) IMPLANT
BINDER BREAST XXLRG (GAUZE/BANDAGES/DRESSINGS) IMPLANT
BLADE KNIFE PERSONA 10 (BLADE) ×4 IMPLANT
BLADE KNIFE PERSONA 15 (BLADE) ×3 IMPLANT
CANISTER SUCT 1200ML W/VALVE (MISCELLANEOUS) ×3 IMPLANT
COVER BACK TABLE REUSABLE LG (DRAPES) ×3 IMPLANT
COVER MAYO STAND REUSABLE (DRAPES) ×3 IMPLANT
COVER WAND RF STERILE (DRAPES) IMPLANT
DECANTER SPIKE VIAL GLASS SM (MISCELLANEOUS) ×2 IMPLANT
DRAIN CHANNEL 10F 3/8 F FF (DRAIN) ×2 IMPLANT
DRAIN CHANNEL 7F FF FLAT (WOUND CARE) ×2 IMPLANT
DRAPE LAPAROSCOPIC ABDOMINAL (DRAPES) ×3 IMPLANT
DRSG PAD ABDOMINAL 8X10 ST (GAUZE/BANDAGES/DRESSINGS) ×8 IMPLANT
ELECT REM PT RETURN 9FT ADLT (ELECTROSURGICAL) ×3
ELECTRODE REM PT RTRN 9FT ADLT (ELECTROSURGICAL) ×1 IMPLANT
EVACUATOR SILICONE 100CC (DRAIN) ×4 IMPLANT
GAUZE SPONGE 4X4 12PLY STRL (GAUZE/BANDAGES/DRESSINGS) ×4 IMPLANT
GAUZE XEROFORM 5X9 LF (GAUZE/BANDAGES/DRESSINGS) ×4 IMPLANT
GLOVE BIO SURGEON STRL SZ 6.5 (GLOVE) ×3 IMPLANT
GLOVE BIO SURGEONS STRL SZ 6.5 (GLOVE) ×2
GLOVE BIOGEL M STRL SZ7.5 (GLOVE) ×1 IMPLANT
GLOVE BIOGEL PI IND STRL 8 (GLOVE) ×1 IMPLANT
GLOVE BIOGEL PI INDICATOR 8 (GLOVE) ×2
GLOVE ECLIPSE 7.0 STRL STRAW (GLOVE) ×3 IMPLANT
GOWN STRL REUS W/ TWL XL LVL3 (GOWN DISPOSABLE) ×2 IMPLANT
GOWN STRL REUS W/TWL XL LVL3 (GOWN DISPOSABLE) ×2
IV NS 500ML (IV SOLUTION)
IV NS 500ML BAXH (IV SOLUTION) ×1 IMPLANT
MARKER SKIN DUAL TIP RULER LAB (MISCELLANEOUS) ×3 IMPLANT
NDL SPNL 18GX3.5 QUINCKE PK (NEEDLE) ×1 IMPLANT
NEEDLE SPNL 18GX3.5 QUINCKE PK (NEEDLE) ×3 IMPLANT
NS IRRIG 1000ML POUR BTL (IV SOLUTION) IMPLANT
PACK BASIN DAY SURGERY FS (CUSTOM PROCEDURE TRAY) ×3 IMPLANT
PAD ALCOHOL SWAB (MISCELLANEOUS) IMPLANT
PEN SKIN MARKING BROAD TIP (MISCELLANEOUS) ×3 IMPLANT
PENCIL SMOKE EVACUATOR (MISCELLANEOUS) ×3 IMPLANT
PILLOW FOAM RUBBER ADULT (PILLOWS) ×3 IMPLANT
PIN SAFETY STERILE (MISCELLANEOUS) ×1 IMPLANT
SHEETING SILICONE GEL EPI DERM (MISCELLANEOUS) IMPLANT
SLEEVE SCD COMPRESS KNEE MED (MISCELLANEOUS) ×3 IMPLANT
SPECIMEN JAR MEDIUM (MISCELLANEOUS) IMPLANT
SPECIMEN JAR X LARGE (MISCELLANEOUS) ×2 IMPLANT
SPONGE LAP 18X18 RF (DISPOSABLE) ×6 IMPLANT
STRIP SUTURE WOUND CLOSURE 1/2 (SUTURE) ×9 IMPLANT
SUT MNCRL AB 3-0 PS2 18 (SUTURE) ×8 IMPLANT
SUT MON AB 2-0 CT1 36 (SUTURE) ×2 IMPLANT
SUT MON AB 5-0 PS2 18 (SUTURE) ×6 IMPLANT
SUT PROLENE 3 0 PS 2 (SUTURE) ×6 IMPLANT
SUT VLOC 180 0 24IN GS25 (SUTURE) IMPLANT
SUT VLOC 90 P-14 23 (SUTURE) ×2 IMPLANT
SYR 50ML LL SCALE MARK (SYRINGE) ×6 IMPLANT
SYR CONTROL 10ML LL (SYRINGE) IMPLANT
SYR TB 1ML LL NO SAFETY (SYRINGE) IMPLANT
TAPE HYPAFIX 6 X30' (GAUZE/BANDAGES/DRESSINGS)
TAPE HYPAFIX 6X30 (GAUZE/BANDAGES/DRESSINGS) ×1 IMPLANT
TAPE MEASURE 72IN RETRACT (INSTRUMENTS)
TAPE MEASURE LINEN 72IN RETRCT (INSTRUMENTS) IMPLANT
TAPE PAPER 1X10 WHT MICROPORE (GAUZE/BANDAGES/DRESSINGS) ×3 IMPLANT
TOWEL GREEN STERILE FF (TOWEL DISPOSABLE) ×2 IMPLANT
TUBE CONNECTING 20'X1/4 (TUBING) ×1
TUBE CONNECTING 20X1/4 (TUBING) ×2 IMPLANT
TUBING INFILTRATION IT-10001 (TUBING) IMPLANT
TUBING SET GRADUATE ASPIR 12FT (MISCELLANEOUS) IMPLANT
UNDERPAD 30X36 HEAVY ABSORB (UNDERPADS AND DIAPERS) ×9 IMPLANT
YANKAUER SUCT BULB TIP NO VENT (SUCTIONS) ×3 IMPLANT

## 2019-09-25 NOTE — Anesthesia Postprocedure Evaluation (Signed)
Anesthesia Post Note  Patient: Erin Jackson  Procedure(s) Performed: EXCISION OF FAT NECROSIS OF LEFT BREAST (Left Breast)     Patient location during evaluation: PACU Anesthesia Type: General Level of consciousness: awake and alert Pain management: pain level controlled Vital Signs Assessment: post-procedure vital signs reviewed and stable Respiratory status: spontaneous breathing, nonlabored ventilation, respiratory function stable and patient connected to nasal cannula oxygen Cardiovascular status: blood pressure returned to baseline and stable Postop Assessment: no apparent nausea or vomiting Anesthetic complications: no    Last Vitals:  Vitals:   09/25/19 0935 09/25/19 1020  BP:  138/90  Pulse: 94 99  Resp: 19 20  Temp:  36.5 C  SpO2: 98% 99%    Last Pain:  Vitals:   09/25/19 1020  TempSrc: Oral  PainSc: 5                  Fredericka Bottcher

## 2019-09-25 NOTE — Anesthesia Postprocedure Evaluation (Deleted)
Anesthesia Post Note  Patient: Erin Jackson  Procedure(s) Performed: EXCISION OF FAT NECROSIS OF LEFT BREAST (Left Breast)     Anesthesia Type: General    Last Vitals:  Vitals:   09/25/19 0935 09/25/19 1020  BP:  138/90  Pulse: 94 99  Resp: 19 20  Temp:  36.5 C  SpO2: 98% 99%    Last Pain:  Vitals:   09/25/19 1020  TempSrc: Oral  PainSc: 5                  Jhania Etherington

## 2019-09-25 NOTE — Op Note (Signed)
NAME: Erin Jackson, Erin Jackson MEDICAL RECORD [id number] ACCOUNT [id number] DATE OF BIRTH:October 11, 1966 FACILITY: [facility] LOCATION: [facility] PHYSICIAN:Tramon Crescenzo Octavia Heir, MD  OPERATIVE REPORT  DATE OF PROCEDURE:  09/25/2019  INDICATIONS:  This is a 53 year old lady status post bilateral breast reductions in the past with severe macromastia.  She now has masses involving the left breast area going from approximately 12 o'clock to 5, increased firmness, discomfort, pain,  consistent with potential for breast cancer, Rule out fat necrosis.  All the procedures in detail were explained to the patient.  The patient understands and consents to surgery today.  DESCRIPTION OF PROCEDURE:  Preoperatively, the patient was set up and drawn for the mass palpable.  She was then taken to the operating room and placed on the operating room table in the supine position, was given adequate general anesthesia, and  intubated orally.  Prep was done to the right and left breast areas using Hibiclens soap and solution, walled off with sterile towels and drapes so as to make a sterile field.  Xylocaine 0.5% with epinephrine was injected locally to the site, a total of  50 mL.  We used the previous areolar incisions from the breast reduction to open up from 12 o'clock to 6 o'clock at that border with a 15 blade.  Then carried it down through skin and subcutaneous tissue and underlying breast tissue.  With palpation, I  was able to identify the areas of fat necrosis or mass effects have firmness, very, very hard.  Using Metzenbaum scissors, I was able to dissect all of that tissue away, and hemostasis was maintained with the Bovie on anticoagulation.  We had a defect in  the area and large dead space.  We were able to obliterate it with deep sutures of 2-0 Monocryl, which were done circumferentially in a pursestring manner x2.  Subcutaneous closure was done with 3-0 Monocryl, and then the skin edges reapproximated with  a running  subcuticular stitch of 3-0 Monocryl.  The wounds were drained with a #7 Blake drain, which was placed all in the depths of the incision and brought out through the lower-most portion of the incision and secured with a 3-0 nylon suture.  The  wounds were cleansed.  Sterile dressing applied to all the areas.  She withstood the procedure very well and was taken to recovery in excellent condition.  LN/NUANCE  D:09/25/2019 T:09/25/2019 JOB:[id number]

## 2019-09-25 NOTE — Brief Op Note (Signed)
09/25/2019  8:43 AM  PATIENT:  Erin Jackson  53 y.o. female  PRE-OPERATIVE DIAGNOSIS:  FAT NECROSIS OF LEFT BREAST  POST-OPERATIVE DIAGNOSIS:  FAT NECROSIS OF LEFT BREAST  PROCEDURE:  Procedure(s): EXCISION OF FAT NECROSIS OF LEFT BREAST (Left)  SURGEON:  Surgeon(s) and Role:    Cristine Polio, MD - Primary  PHYSICIAN ASSISTANT:   ASSISTANTS: none   ANESTHESIA:   general  EBL:  30 mL   BLOOD ADMINISTERED:none  DRAINS: (7 Jp) Jackson-Pratt drain(s) with closed bulb suction in the no 7 Jp   LOCAL MEDICATIONS USED:  XYLOCAINE   SPECIMEN:  Excision  DISPOSITION OF SPECIMEN:  PATHOLOGY  COUNTS:  YES  TOURNIQUET:  * No tourniquets in log *  DICTATION: .Other Dictation: Dictation Number [id number]  PLAN OF CARE: Discharge to home after PACU  PATIENT DISPOSITION:  PACU - hemodynamically stable.   Delay start of Pharmacological VTE agent (>24hrs) due to surgical blood loss or risk of bleeding: yes

## 2019-09-25 NOTE — Anesthesia Procedure Notes (Signed)
Procedure Name: LMA Insertion Date/Time: 09/25/2019 7:47 AM Performed by: Marrianne Mood, CRNA Pre-anesthesia Checklist: Patient identified, Emergency Drugs available, Suction available, Patient being monitored and Timeout performed Patient Re-evaluated:Patient Re-evaluated prior to induction Oxygen Delivery Method: Circle system utilized Preoxygenation: Pre-oxygenation with 100% oxygen Induction Type: IV induction Ventilation: Mask ventilation without difficulty LMA: LMA inserted LMA Size: 4.0 Number of attempts: 1 Airway Equipment and Method: Bite block Placement Confirmation: positive ETCO2 Tube secured with: Tape Dental Injury: Teeth and Oropharynx as per pre-operative assessment

## 2019-09-25 NOTE — H&P (Signed)
Erin Jackson is an 53 y.o. female.   Chief Complaint: mass left breast HPI: Increased fat necrosis left breast with increased pain and discomfort  Past Medical History:  Diagnosis Date  . Anxiety   . Chronic back pain   . Diabetes mellitus without complication (HCC)   . GERD (gastroesophageal reflux disease)   . Hypertension   . Sleep apnea    was on CPAP but none in over 95yrs    Past Surgical History:  Procedure Laterality Date  . ABDOMINAL HYSTERECTOMY    . BREAST REDUCTION SURGERY Bilateral 08/15/2018   Procedure: BILATERAL MAMMARY REDUCTION  (BREAST);  Surgeon: Louisa Second, MD;  Location: [facility];  Service: Plastics;  Laterality: Bilateral;  . CERVICAL SPINE SURGERY  2015   has a metal plate  . FOOT SURGERY Left 2014  . HAND SURGERY    . REDUCTION MAMMAPLASTY Bilateral 07/2018    Family History  Problem Relation Age of Onset  . Breast cancer Maternal Aunt   . Breast cancer Maternal Grandmother    Social History:  reports that she has been smoking. She has been smoking about 0.25 packs per day. She has never used smokeless tobacco. She reports current alcohol use. She reports previous drug use.  Allergies:  Allergies  Allergen Reactions  . Citrus Shortness Of Breath  . Cucumber Extract Anaphylaxis  . Norvasc [Amlodipine] Other (See Comments)    Muscle became very tight  . Apple   . Other Other (See Comments)    Tree nuts, walnuts  . Aspirin Rash  . Ibuprofen Rash  . Niacin And Related Rash    Medications Prior to Admission  Medication Sig Dispense Refill  . alprazolam (XANAX) 2 MG tablet Take 2 mg by mouth 2 (two) times daily as needed for sleep.    . cloNIDine (CATAPRES) 0.2 MG tablet Take 0.4 mg by mouth 2 (two) times daily.     Marland Kitchen HYDROcodone-acetaminophen (NORCO) 10-325 MG tablet Take 1 tablet by mouth 2 (two) times daily.    . metFORMIN (GLUCOPHAGE) 500 MG tablet Take 500 mg by mouth 2 (two) times daily with a meal.    .  omeprazole (PRILOSEC) 20 MG capsule Take 1 capsule (20 mg total) by mouth daily. 30 capsule 0  . traZODone (DESYREL) 150 MG tablet Take 150 mg by mouth at bedtime.    Marland Kitchen EPINEPHRINE 0.3 mg/0.3 mL IJ SOAJ injection Inject 0.3 mLs (0.3 mg total) into the muscle once as needed (allergic reaction). 1 Device 1  . fluticasone (FLONASE) 50 MCG/ACT nasal spray Place 2 sprays into both nostrils daily. 16 g 0    Results for orders placed or performed during the hospital encounter of 09/25/19 (from the past 48 hour(s))  Glucose, capillary     Status: Abnormal   Collection Time: 09/25/19  7:02 AM  Result Value Ref Range   Glucose-Capillary 173 (H) 70 - 99 mg/dL   No results found.  Review of Systems  Constitutional: Negative.   HENT: Negative.   Eyes: Negative.   Respiratory: Negative.   Cardiovascular: Negative.   Gastrointestinal: Negative.   Genitourinary: Negative.   Musculoskeletal: Negative.   Skin: Negative.   Neurological: Negative.   Endo/Heme/Allergies: Negative.   Psychiatric/Behavioral: Negative.     Blood pressure 125/81, pulse 89, temperature (!) 97.5 F (36.4 C), temperature source Oral, resp. rate 16, height 5' 2.5" (1.588 m), weight 98.1 kg, SpO2 97 %. Physical Exam  Constitutional: She is oriented to person, place,  and time. She appears well-developed and well-nourished.  HENT:  Head: Normocephalic.  Eyes: Pupils are equal, round, and reactive to light.  Neck: Normal range of motion. Neck supple. No tracheal deviation present.  Cardiovascular: Normal rate, regular rhythm and normal heart sounds.  Respiratory: Effort normal and breath sounds normal.  GI: Soft. She exhibits no distension. There is no abdominal tenderness.  Musculoskeletal: Normal range of motion.  Neurological: She is alert and oriented to person, place, and time.  Skin: Skin is warm and dry.     Assessment/Plan Left breast  Fat necrosis mass foe excisional biopsy  Pantelis Elgersma L, MD 09/25/2019,  7:40 AM

## 2019-09-25 NOTE — Discharge Instructions (Signed)
1,000mg  Tylenol given at 9:15am, no Tylenol until 3:15pm if needed  Activity (include date of return to work if known) As tolerated: NO showers NO driving No heavy activities  Diet:regular No restrictions:  Wound Care: Keep dressing clean & dry  Do not change dressings  Special Instructions: Do not raise arms up Continue to empty, recharge, & record drainage from J-P drains   Call Doctor if any unusual problems occur such as pain, excessive Bleeding, unrelieved Nausea/vomiting, Fever &/or chills When lying down, keep head elevated on 2-3 pillows or back-rest  Follow-up appointment: Please call the office.  The patient received discharge instruction from:___________________________________________   Patient signature ________________________________________ / Date___________    Signature of individual providing instructions/ Date________________              About my Jackson-Pratt Bulb Drain  What is a Jackson-Pratt bulb? A Jackson-Pratt is a soft, round device used to collect drainage. It is connected to a long, thin drainage catheter, which is held in place by one or two small stiches near your surgical incision site. When the bulb is squeezed, it forms a vacuum, forcing the drainage to empty into the bulb.  Emptying the Jackson-Pratt bulb- To empty the bulb: 1. Release the plug on the top of the bulb. 2. Pour the bulb's contents into a measuring container which your nurse will provide. 3. Record the time emptied and amount of drainage. Empty the drain(s) as often as your     doctor or nurse recommends.  Date                  Time                    Amount (Drain 1)                 Amount (Drain  2)  _____________________________________________________________________  _____________________________________________________________________  _____________________________________________________________________  _____________________________________________________________________  _____________________________________________________________________  _____________________________________________________________________  _____________________________________________________________________  _____________________________________________________________________  Squeezing the Jackson-Pratt Bulb- To squeeze the bulb: 1. Make sure the plug at the top of the bulb is open. 2. Squeeze the bulb tightly in your fist. You will hear air squeezing from the bulb. 3. Replace the plug while the bulb is squeezed. 4. Use a safety pin to attach the bulb to your clothing. This will keep the catheter from     pulling at the bulb insertion site.  When to call your doctor- Call your doctor if:  Drain site becomes red, swollen or hot.  You have a fever greater than 101 degrees F.  There is oozing at the drain site.  Drain falls out (apply a guaze bandage over the drain hole and secure it with tape).  Drainage increases daily not related to activity patterns. (You will usually have more drainage when you are active than when you are resting.)  Drainage has a bad odor.   Post Anesthesia Home Care Instructions  Activity: Get plenty of rest for the remainder of the day. A responsible individual must stay with you for 24 hours following the procedure.  For the next 24 hours, DO NOT: -Drive a car -Advertising copywriter -Drink alcoholic beverages -Take any medication unless instructed by your physician -Make any legal decisions or sign important papers.  Meals: Start with liquid foods such as gelatin or soup. Progress to regular foods as tolerated. Avoid greasy, spicy, heavy foods. If nausea  and/or vomiting occur, drink only clear liquids until the nausea and/or vomiting subsides. Call your physician  if vomiting continues.  Special Instructions/Symptoms: Your throat may feel dry or sore from the anesthesia or the breathing tube placed in your throat during surgery. If this causes discomfort, gargle with warm salt water. The discomfort should disappear within 24 hours.  If you had a scopolamine patch placed behind your ear for the management of post- operative nausea and/or vomiting:  1. The medication in the patch is effective for 72 hours, after which it should be removed.  Wrap patch in a tissue and discard in the trash. Wash hands thoroughly with soap and water. 2. You may remove the patch earlier than 72 hours if you experience unpleasant side effects which may include dry mouth, dizziness or visual disturbances. 3. Avoid touching the patch. Wash your hands with soap and water after contact with the patch.

## 2019-09-25 NOTE — Transfer of Care (Signed)
Immediate Anesthesia Transfer of Care Note  Patient: Erin Jackson  Procedure(s) Performed: EXCISION OF FAT NECROSIS OF LEFT BREAST (Left Breast)  Patient Location: PACU  Anesthesia Type:General  Level of Consciousness: awake and patient cooperative  Airway & Oxygen Therapy: Patient Spontanous Breathing and Patient connected to face mask oxygen  Post-op Assessment: Report given to RN and Post -op Vital signs reviewed and stable  Post vital signs: Reviewed and stable  Last Vitals:  Vitals Value Taken Time  BP 154/88 09/25/19 0842  Temp    Pulse 110 09/25/19 0843  Resp    SpO2 100 % 09/25/19 0843  Vitals shown include unvalidated device data.  Last Pain:  Vitals:   09/25/19 0653  TempSrc: Oral  PainSc: 0-No pain      Patients Stated Pain Goal: 3 (50/09/38 1829)  Complications: No apparent anesthesia complications

## 2019-09-26 LAB — SURGICAL PATHOLOGY

## 2019-10-02 ENCOUNTER — Encounter (HOSPITAL_BASED_OUTPATIENT_CLINIC_OR_DEPARTMENT_OTHER): Payer: Self-pay | Admitting: Specialist

## 2019-11-09 ENCOUNTER — Ambulatory Visit (INDEPENDENT_AMBULATORY_CARE_PROVIDER_SITE_OTHER): Payer: Medicare HMO

## 2019-11-09 ENCOUNTER — Ambulatory Visit (INDEPENDENT_AMBULATORY_CARE_PROVIDER_SITE_OTHER): Payer: Medicare HMO | Admitting: Orthopaedic Surgery

## 2019-11-09 ENCOUNTER — Other Ambulatory Visit: Payer: Self-pay

## 2019-11-09 ENCOUNTER — Encounter: Payer: Self-pay | Admitting: Orthopaedic Surgery

## 2019-11-09 DIAGNOSIS — M1811 Unilateral primary osteoarthritis of first carpometacarpal joint, right hand: Secondary | ICD-10-CM

## 2019-11-09 DIAGNOSIS — M79641 Pain in right hand: Secondary | ICD-10-CM | POA: Diagnosis not present

## 2019-11-09 DIAGNOSIS — M654 Radial styloid tenosynovitis [de Quervain]: Secondary | ICD-10-CM

## 2019-11-09 DIAGNOSIS — G5601 Carpal tunnel syndrome, right upper limb: Secondary | ICD-10-CM | POA: Diagnosis not present

## 2019-11-09 NOTE — Progress Notes (Signed)
Office Visit Note   Patient: Erin Jackson           Date of Birth: 1966-10-27           MRN: [id number] Visit Date: 11/09/2019              Requested by: Raymon Mutton., FNP [address] [place],  [place] [address] PCP: Raymon Mutton., FNP   Assessment & Plan: Visit Diagnoses:  1. Primary osteoarthritis of first carpometacarpal joint of right hand   2. Right carpal tunnel syndrome   3. De Quervain's tenosynovitis, right     Plan: Impression is #1 right carpal tunnel syndrome #2 right de Quervain's tenosynovitis #3 right first CMC osteoarthritis.  Based on discussion today patient has elected to proceed with carpal tunnel release, de Quervain's release, CMC arthroplasty.  Risk benefits alternatives to surgery were again discussed and reviewed with the patient.  She had no success with conservative treatment with her left hand in the past so she is not interested in this for the right hand.  We will obtain an A1c level today prior to scheduling surgery.  Questions encouraged and answered.  Follow-Up Instructions: Return if symptoms worsen or fail to improve.   Orders:  Orders Placed This Encounter  Procedures  . XR Hand Complete Right   No orders of the defined types were placed in this encounter.     Procedures: No procedures performed   Clinical Data: No additional findings.   Subjective: Chief Complaint  Patient presents with  . Right Wrist - Pain    HPI patient is a pleasant 53 year old right-hand-dominant female who presents our clinic today with right hand and thumb pain.  This has been ongoing for the past several years and has progressively worsened.  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  The pain she is having is to the right [facility] joint into the first dorsal compartment.  Any use of her thumb to include playing games on her phone and cooking makes this worse.  She has tried a wrist splint as  well as over-the-counter pain medications without relief of symptoms.  She is even taking Norco for degenerative disc disease without relief.  She notes numbness to her thumb, index and small fingers.  No previous injections to the right thumb hand or wrist.  She did have a nerve conduction study of bilateral upper extremities in 2018 which showed moderate median nerve compression on the right.  She is almost 2 years out from left carpal tunnel release CMC arthroplasty and de Quervain's release by Dr. Mina Marble date of surgery 12/24/2017.  Doing well with the left upper extremity.  Review of Systems as detailed in HPI.  All others reviewed and negative.   Objective: Vital Signs: There were no vitals taken for this visit.  Physical Exam well-developed well-nourished female in no acute distress.  Alert and oriented x3.  Ortho Exam examination of the right upper extremity reveals marked tenderness to the first [facility] joint.  Positive grind test.  Moderate tenderness over the first dorsal compartment.  She has pain with attempts of Finkelstein test.  Positive Tinel at the wrist.  Pain attempting Phalen's test.  Specialty Comments:  No specialty comments available.  Imaging: Xr Hand Complete Right  Result Date: 11/09/2019 Marked degenerative changes first [facility] joint consistent with end stage disease.    PMFS History: Patient  Active Problem List   Diagnosis Date Noted  . Primary osteoarthritis of first carpometacarpal joint of right hand 11/09/2019  . Right carpal tunnel syndrome 11/09/2019  . De Quervain's tenosynovitis, right 11/09/2019  . Chronic neck pain 08/20/2017  . Supraclavicular mass 08/20/2017  . Hypertension associated with diabetes ([place]) 08/20/2017  . T2DM (type 2 diabetes mellitus) ([place]) 08/20/2017  . GERD (gastroesophageal reflux disease) 08/20/2017  . Cigarette nicotine dependence without complication 88/41/6606   Past Medical History:  Diagnosis Date  . Anxiety   . Chronic  back pain   . Diabetes mellitus without complication ([place])   . GERD (gastroesophageal reflux disease)   . Hypertension   . Sleep apnea    was on CPAP but none in over 91yrs    Family History  Problem Relation Age of Onset  . Breast cancer Maternal Aunt   . Breast cancer Maternal Grandmother     Past Surgical History:  Procedure Laterality Date  . ABDOMINAL HYSTERECTOMY    . BREAST REDUCTION SURGERY Bilateral 08/15/2018   Procedure: BILATERAL MAMMARY REDUCTION  (BREAST);  Surgeon: Cristine Polio, MD;  Location: [facility];  Service: Plastics;  Laterality: Bilateral;  . BREAST REDUCTION SURGERY Left 09/25/2019   Procedure: EXCISION OF FAT NECROSIS OF LEFT BREAST;  Surgeon: Cristine Polio, MD;  Location: [facility];  Service: Plastics;  Laterality: Left;  . CERVICAL SPINE SURGERY  2015   has a metal plate  . FOOT SURGERY Left 2014  . HAND SURGERY    . REDUCTION MAMMAPLASTY Bilateral 07/2018   Social History   Occupational History  . Not on file  Tobacco Use  . Smoking status: Current Every Day Smoker    Packs/day: 0.25  . Smokeless tobacco: Never Used  . Tobacco comment: one pack every 3 days  Substance and Sexual Activity  . Alcohol use: Yes    Comment: socially  . Drug use: Not Currently  . Sexual activity: Never

## 2019-11-09 NOTE — Addendum Note (Signed)
Addended by: Precious Bard on: 11/09/2019 10:36 AM   Modules accepted: Orders

## 2019-11-10 ENCOUNTER — Telehealth: Payer: Self-pay

## 2019-11-10 NOTE — Telephone Encounter (Signed)
A1C done?  Results?

## 2019-11-10 NOTE — Telephone Encounter (Signed)
Patient called asking about when her surgery will be scheduled

## 2019-11-13 ENCOUNTER — Telehealth: Payer: Self-pay | Admitting: Orthopaedic Surgery

## 2019-11-13 ENCOUNTER — Other Ambulatory Visit: Payer: Self-pay | Admitting: Orthopaedic Surgery

## 2019-11-13 MED ORDER — FEBUXOSTAT 40 MG PO TABS: 40.0000 mg | ORAL_TABLET | Freq: Every day | ORAL | 6 refills | Status: DC

## 2019-11-13 NOTE — Telephone Encounter (Signed)
What happened to A1C?

## 2019-11-13 NOTE — Telephone Encounter (Signed)
Called [facility], they will add A1C. Results Pending. 1-2 days.

## 2019-11-13 NOTE — Telephone Encounter (Signed)
Called. No answer. Left voicemail.

## 2019-11-13 NOTE — Telephone Encounter (Signed)
I do not see that added. We would have to call the ([facility] lab to add it in. I will work on this as soon as I can this AM I am in the Temp station. Thanks.   Patient states you called her and left VM but did not understand. Would like for you to call her back to discuss labs in detail.

## 2019-11-13 NOTE — Telephone Encounter (Signed)
I havent' seen results yet.  Kathlee Nations, do you know?

## 2019-11-13 NOTE — Telephone Encounter (Signed)
Pt called in said dr.xu gave her a call in regards to her blood work but pt says she would like for someone to give her a call and explain what dr.xu was saying she said she didn't really understand everything.    [phone]

## 2019-11-13 NOTE — Telephone Encounter (Signed)
See other message

## 2019-11-13 NOTE — Telephone Encounter (Signed)
Looks like Arthritis panel was put in.

## 2019-11-14 LAB — TEST AUTHORIZATION

## 2019-11-14 LAB — ANA: Anti Nuclear Antibody (ANA): POSITIVE — AB

## 2019-11-14 LAB — HEMOGLOBIN A1C W/OUT EAG: Hgb A1c MFr Bld: 9.8 % of total Hgb — ABNORMAL HIGH (ref <5.7)

## 2019-11-14 LAB — RHEUMATOID FACTOR: Rheumatoid fact SerPl-aCnc: <14 IU/mL (ref <14)

## 2019-11-14 LAB — ANTI-NUCLEAR AB-TITER (ANA TITER): ANA Titer 1: 1:320 {titer} — ABNORMAL HIGH

## 2019-11-14 LAB — URIC ACID: Uric Acid, Serum: 7.8 mg/dL — ABNORMAL HIGH (ref 2.5–7.0)

## 2019-11-14 LAB — SEDIMENTATION RATE: Sed Rate: 6 mm/h (ref 0–30)

## 2019-11-14 NOTE — Progress Notes (Signed)
A1C is 9.8.  postpone surgery until below 8.0.  also I sent in medications for gout.

## 2019-11-20 NOTE — Progress Notes (Signed)
In that case, she should see her PCP so that she can be started on the appropriate medication.  Thanks.

## 2020-01-29 ENCOUNTER — Other Ambulatory Visit: Payer: Medicare HMO

## 2020-02-20 ENCOUNTER — Other Ambulatory Visit: Payer: Medicare HMO

## 2020-03-26 ENCOUNTER — Other Ambulatory Visit: Payer: Medicare HMO

## 2020-04-26 ENCOUNTER — Other Ambulatory Visit: Payer: Self-pay | Admitting: Podiatry

## 2020-04-26 ENCOUNTER — Ambulatory Visit (INDEPENDENT_AMBULATORY_CARE_PROVIDER_SITE_OTHER): Payer: Medicare HMO | Admitting: Podiatry

## 2020-04-26 ENCOUNTER — Ambulatory Visit (INDEPENDENT_AMBULATORY_CARE_PROVIDER_SITE_OTHER): Payer: Medicare HMO

## 2020-04-26 ENCOUNTER — Other Ambulatory Visit: Payer: Self-pay

## 2020-04-26 DIAGNOSIS — M19071 Primary osteoarthritis, right ankle and foot: Secondary | ICD-10-CM | POA: Diagnosis not present

## 2020-04-26 DIAGNOSIS — M7751 Other enthesopathy of right foot: Secondary | ICD-10-CM | POA: Diagnosis not present

## 2020-04-26 DIAGNOSIS — M205X1 Other deformities of toe(s) (acquired), right foot: Secondary | ICD-10-CM | POA: Diagnosis not present

## 2020-04-26 DIAGNOSIS — M79671 Pain in right foot: Secondary | ICD-10-CM

## 2020-04-26 DIAGNOSIS — M7752 Other enthesopathy of left foot: Secondary | ICD-10-CM | POA: Diagnosis not present

## 2020-04-26 DIAGNOSIS — M205X2 Other deformities of toe(s) (acquired), left foot: Secondary | ICD-10-CM | POA: Diagnosis not present

## 2020-04-26 DIAGNOSIS — M19072 Primary osteoarthritis, left ankle and foot: Secondary | ICD-10-CM

## 2020-04-26 DIAGNOSIS — M79672 Pain in left foot: Secondary | ICD-10-CM

## 2020-04-26 NOTE — Progress Notes (Signed)
  Subjective:  Patient ID: Erin Jackson, female    DOB: Mar 27, 1966,  MRN: [id number]  Chief Complaint  Patient presents with  . Nail Problem    Bilateral 1st toenails medial borders painful 1 year duration, pt denies drainage.  . Foot Pain    Bilateral medial foot pain 3 month duration no known injuries.    54 y.o. female presents with the above complaint. History confirmed with patient.   Objective:  Physical Exam: warm, good capillary refill, no trophic changes or ulcerative lesions, normal DP and PT pulses and normal sensory exam. Left Foot: POP 1st MPJ bilat  Right Foot: POP 1st MPJ bilat   No images are attached to the encounter.  Radiographs: X-ray of both feet: no fracture, dislocation, swelling or degenerative changes noted Assessment:   1. Capsulitis of metatarsophalangeal (MTP) joint of right foot   2. Capsulitis of metatarsophalangeal (MTP) joint of left foot   3. Acquired hallux limitus of both feet      Plan:  Patient was evaluated and treated and all questions answered.  Arthritis and Capsulitis -Educated on etiology -XR reviewed with patient -Injection delivered to the painful joint  Procedure: Joint Injection Location: Bilateral 1st MP joint Skin Prep: Alcohol. Injectate: 0.5 cc 1% lidocaine plain, 0.5 cc dexamethasone phosphate. Disposition: Patient tolerated procedure well. Injection site dressed with a band-aid.   Return in about 3 weeks (around 05/17/2020) for Capsulitis, Arthritis.

## 2020-05-24 ENCOUNTER — Ambulatory Visit: Payer: Medicare HMO | Admitting: Podiatry

## 2020-06-17 HISTORY — PX: CARPAL TUNNEL RELEASE: SHX101

## 2020-06-18 ENCOUNTER — Observation Stay (HOSPITAL_COMMUNITY)
Admission: AD | Admit: 2020-06-18 | Discharge: 2020-06-19 | Disposition: A | Discharge status: Home / Self Care | Payer: Medicare HMO | Source: Ambulatory Visit | Attending: Orthopedic Surgery | Admitting: Orthopedic Surgery

## 2020-06-18 ENCOUNTER — Encounter (HOSPITAL_COMMUNITY): Payer: Self-pay | Admitting: Orthopedic Surgery

## 2020-06-18 DIAGNOSIS — F17210 Nicotine dependence, cigarettes, uncomplicated: Secondary | ICD-10-CM

## 2020-06-18 DIAGNOSIS — Z79899 Other long term (current) drug therapy: Secondary | ICD-10-CM | POA: Insufficient documentation

## 2020-06-18 DIAGNOSIS — E1159 Type 2 diabetes mellitus with other circulatory complications: Secondary | ICD-10-CM | POA: Diagnosis not present

## 2020-06-18 DIAGNOSIS — M1811 Unilateral primary osteoarthritis of first carpometacarpal joint, right hand: Secondary | ICD-10-CM | POA: Diagnosis present

## 2020-06-18 DIAGNOSIS — G8929 Other chronic pain: Secondary | ICD-10-CM | POA: Insufficient documentation

## 2020-06-18 DIAGNOSIS — M542 Cervicalgia: Secondary | ICD-10-CM

## 2020-06-18 DIAGNOSIS — F419 Anxiety disorder, unspecified: Secondary | ICD-10-CM | POA: Diagnosis not present

## 2020-06-18 DIAGNOSIS — Z7984 Long term (current) use of oral hypoglycemic drugs: Secondary | ICD-10-CM | POA: Insufficient documentation

## 2020-06-18 DIAGNOSIS — Z20822 Contact with and (suspected) exposure to covid-19: Secondary | ICD-10-CM | POA: Diagnosis not present

## 2020-06-18 DIAGNOSIS — E119 Type 2 diabetes mellitus without complications: Secondary | ICD-10-CM

## 2020-06-18 DIAGNOSIS — G4733 Obstructive sleep apnea (adult) (pediatric): Secondary | ICD-10-CM | POA: Diagnosis not present

## 2020-06-18 DIAGNOSIS — Z79891 Long term (current) use of opiate analgesic: Secondary | ICD-10-CM | POA: Insufficient documentation

## 2020-06-18 DIAGNOSIS — I10 Essential (primary) hypertension: Secondary | ICD-10-CM | POA: Diagnosis not present

## 2020-06-18 DIAGNOSIS — E1169 Type 2 diabetes mellitus with other specified complication: Secondary | ICD-10-CM | POA: Insufficient documentation

## 2020-06-18 DIAGNOSIS — K219 Gastro-esophageal reflux disease without esophagitis: Secondary | ICD-10-CM | POA: Diagnosis not present

## 2020-06-18 LAB — CBC
HCT: 36.3 % (ref 36.0–46.0)
Hemoglobin: 11.1 g/dL — ABNORMAL LOW (ref 12.0–15.0)
MCH: 28.5 pg (ref 26.0–34.0)
MCHC: 30.6 g/dL (ref 30.0–36.0)
MCV: 93.1 fL (ref 80.0–100.0)
Platelets: 181 10*3/uL (ref 150–400)
RBC: 3.9 MIL/uL (ref 3.87–5.11)
RDW: 13.7 % (ref 11.5–15.5)
WBC: 11.9 10*3/uL — ABNORMAL HIGH (ref 4.0–10.5)
nRBC: 0 % (ref 0.0–0.2)

## 2020-06-18 LAB — COMPREHENSIVE METABOLIC PANEL
ALT: 25 U/L (ref 0–44)
AST: 24 U/L (ref 15–41)
Albumin: 3.3 g/dL — ABNORMAL LOW (ref 3.5–5.0)
Alkaline Phosphatase: 68 U/L (ref 38–126)
Anion gap: 8 (ref 5–15)
BUN: 10 mg/dL (ref 6–20)
CO2: 25 mmol/L (ref 22–32)
Calcium: 9 mg/dL (ref 8.9–10.3)
Chloride: 101 mmol/L (ref 98–111)
Creatinine, Ser: 0.96 mg/dL (ref 0.44–1.00)
GFR calc Af Amer: >60 mL/min (ref >60)
GFR calc non Af Amer: >60 mL/min (ref >60)
Glucose, Bld: 192 mg/dL — ABNORMAL HIGH (ref 70–99)
Potassium: 4.5 mmol/L (ref 3.5–5.1)
Sodium: 134 mmol/L — ABNORMAL LOW (ref 135–145)
Total Bilirubin: 0.5 mg/dL (ref 0.3–1.2)
Total Protein: 7.4 g/dL (ref 6.5–8.1)

## 2020-06-18 LAB — SARS CORONAVIRUS 2 BY RT PCR (HOSPITAL ORDER, PERFORMED IN ~~LOC~~ HOSPITAL LAB): SARS Coronavirus 2: NEGATIVE

## 2020-06-18 LAB — HIV ANTIBODY (ROUTINE TESTING W REFLEX): HIV Screen 4th Generation wRfx: NONREACTIVE

## 2020-06-18 LAB — GLUCOSE, CAPILLARY
Glucose-Capillary: 189 mg/dL — ABNORMAL HIGH (ref 70–99)
Glucose-Capillary: 224 mg/dL — ABNORMAL HIGH (ref 70–99)

## 2020-06-18 MED ORDER — ONDANSETRON HCL 4 MG PO TABS: 4.0000 mg | ORAL_TABLET | Freq: Four times a day (QID) | ORAL | Status: DC | PRN

## 2020-06-18 MED ORDER — DIPHENHYDRAMINE HCL 12.5 MG/5ML PO ELIX: 12.5000 mg | ORAL_SOLUTION | ORAL | Status: DC | PRN

## 2020-06-18 MED ORDER — ALBUTEROL SULFATE (2.5 MG/3ML) 0.083% IN NEBU: 3.0000 mL | INHALATION_SOLUTION | Freq: Four times a day (QID) | RESPIRATORY_TRACT | Status: DC | PRN

## 2020-06-18 MED ORDER — MAGNESIUM CITRATE PO SOLN: 1.0000 | Freq: Once | ORAL | Status: DC | PRN

## 2020-06-18 MED ORDER — ALPRAZOLAM 0.5 MG PO TABS: 2.0000 mg | ORAL_TABLET | Freq: Every evening | ORAL | Status: DC | PRN

## 2020-06-18 MED ORDER — HYDROMORPHONE HCL 1 MG/ML IJ SOLN
0.5000 mg | INTRAMUSCULAR | Status: DC | PRN
  Administered 2020-06-18 – 2020-06-19 (×4): 1 mg via INTRAVENOUS
  Filled 2020-06-18 (×4): qty 1

## 2020-06-18 MED ORDER — OXYCODONE HCL 5 MG PO TABS
5.0000 mg | ORAL_TABLET | ORAL | Status: DC | PRN
  Filled 2020-06-18 (×2): qty 2

## 2020-06-18 MED ORDER — ATORVASTATIN CALCIUM 40 MG PO TABS
40.0000 mg | ORAL_TABLET | Freq: Every day | ORAL | Status: DC
  Administered 2020-06-18: 40 mg via ORAL
  Filled 2020-06-18: qty 1

## 2020-06-18 MED ORDER — HYDROCODONE-ACETAMINOPHEN 10-325 MG PO TABS
1.0000 | ORAL_TABLET | Freq: Two times a day (BID) | ORAL | Status: DC
  Administered 2020-06-18: 1 via ORAL
  Filled 2020-06-18 (×2): qty 1

## 2020-06-18 MED ORDER — FLUTICASONE PROPIONATE 50 MCG/ACT NA SUSP
2.0000 | Freq: Every day | NASAL | Status: DC
  Filled 2020-06-18: qty 16

## 2020-06-18 MED ORDER — DOCUSATE SODIUM 100 MG PO CAPS
100.0000 mg | ORAL_CAPSULE | Freq: Two times a day (BID) | ORAL | Status: DC
  Administered 2020-06-18 – 2020-06-19 (×3): 100 mg via ORAL
  Filled 2020-06-18 (×3): qty 1

## 2020-06-18 MED ORDER — METHOCARBAMOL 1000 MG/10ML IJ SOLN
500.0000 mg | Freq: Four times a day (QID) | INTRAVENOUS | Status: DC | PRN
  Filled 2020-06-18: qty 5

## 2020-06-18 MED ORDER — OXYCODONE HCL 5 MG PO TABS
10.0000 mg | ORAL_TABLET | ORAL | Status: DC | PRN
  Administered 2020-06-18: 10 mg via ORAL
  Administered 2020-06-19: 15 mg via ORAL
  Filled 2020-06-18: qty 3

## 2020-06-18 MED ORDER — BISACODYL 5 MG PO TBEC: 5.0000 mg | DELAYED_RELEASE_TABLET | Freq: Every day | ORAL | Status: DC | PRN

## 2020-06-18 MED ORDER — ACETAMINOPHEN 325 MG PO TABS: 325.0000 mg | ORAL_TABLET | Freq: Four times a day (QID) | ORAL | Status: DC | PRN

## 2020-06-18 MED ORDER — INSULIN ASPART 100 UNIT/ML ~~LOC~~ SOLN
0.0000 [IU] | Freq: Every day | SUBCUTANEOUS | Status: DC
  Administered 2020-06-18: 2 [IU] via SUBCUTANEOUS

## 2020-06-18 MED ORDER — INSULIN ASPART 100 UNIT/ML ~~LOC~~ SOLN
0.0000 [IU] | Freq: Three times a day (TID) | SUBCUTANEOUS | Status: DC
  Administered 2020-06-18: 3 [IU] via SUBCUTANEOUS
  Administered 2020-06-19: 11 [IU] via SUBCUTANEOUS
  Administered 2020-06-19: 5 [IU] via SUBCUTANEOUS

## 2020-06-18 MED ORDER — ONDANSETRON HCL 4 MG/2ML IJ SOLN: 4.0000 mg | Freq: Four times a day (QID) | INTRAMUSCULAR | Status: DC | PRN

## 2020-06-18 MED ORDER — KCL IN DEXTROSE-NACL 20-5-0.45 MEQ/L-%-% IV SOLN
INTRAVENOUS | Status: DC
  Filled 2020-06-18: qty 1000

## 2020-06-18 MED ORDER — ALLOPURINOL 100 MG PO TABS
100.0000 mg | ORAL_TABLET | Freq: Every day | ORAL | Status: DC
  Administered 2020-06-19: 100 mg via ORAL
  Filled 2020-06-18: qty 1

## 2020-06-18 MED ORDER — METHOCARBAMOL 500 MG PO TABS
500.0000 mg | ORAL_TABLET | Freq: Four times a day (QID) | ORAL | Status: DC | PRN
  Administered 2020-06-18 – 2020-06-19 (×2): 500 mg via ORAL
  Filled 2020-06-18 (×3): qty 1

## 2020-06-18 MED ORDER — FEBUXOSTAT 40 MG PO TABS
40.0000 mg | ORAL_TABLET | Freq: Every day | ORAL | Status: DC
  Administered 2020-06-18 – 2020-06-19 (×2): 40 mg via ORAL
  Filled 2020-06-18 (×2): qty 1

## 2020-06-18 MED ORDER — PANTOPRAZOLE SODIUM 40 MG PO TBEC
40.0000 mg | DELAYED_RELEASE_TABLET | Freq: Every day | ORAL | Status: DC
  Administered 2020-06-18 – 2020-06-19 (×2): 40 mg via ORAL
  Filled 2020-06-18 (×2): qty 1

## 2020-06-18 MED ORDER — CLONIDINE HCL 0.2 MG PO TABS
0.4000 mg | ORAL_TABLET | Freq: Two times a day (BID) | ORAL | Status: DC
  Administered 2020-06-18 – 2020-06-19 (×2): 0.4 mg via ORAL
  Filled 2020-06-18 (×2): qty 2

## 2020-06-18 MED ORDER — ACETAMINOPHEN 500 MG PO TABS
1000.0000 mg | ORAL_TABLET | Freq: Four times a day (QID) | ORAL | Status: AC
  Administered 2020-06-18 – 2020-06-19 (×4): 1000 mg via ORAL
  Filled 2020-06-18 (×4): qty 2

## 2020-06-18 MED ORDER — POLYETHYLENE GLYCOL 3350 17 G PO PACK: 17.0000 g | PACK | Freq: Every day | ORAL | Status: DC | PRN

## 2020-06-18 MED ORDER — ZOLPIDEM TARTRATE 5 MG PO TABS: 5.0000 mg | ORAL_TABLET | Freq: Every evening | ORAL | Status: DC | PRN

## 2020-06-18 MED ORDER — NICOTINE 14 MG/24HR TD PT24
14.0000 mg | MEDICATED_PATCH | Freq: Every day | TRANSDERMAL | Status: DC
  Administered 2020-06-18 – 2020-06-19 (×2): 14 mg via TRANSDERMAL
  Filled 2020-06-18 (×2): qty 1

## 2020-06-18 MED ORDER — CLONIDINE HCL 0.2 MG/24HR TD PTWK: 0.2000 mg | MEDICATED_PATCH | TRANSDERMAL | Status: DC

## 2020-06-18 MED ORDER — TRAZODONE HCL 50 MG PO TABS
150.0000 mg | ORAL_TABLET | Freq: Every day | ORAL | Status: DC
  Administered 2020-06-18: 150 mg via ORAL
  Filled 2020-06-18: qty 3

## 2020-06-18 NOTE — Progress Notes (Addendum)
   06/18/20 1501  Vitals  Temp 98.8 F (37.1 C)  Temp Source Oral  BP (!) 209/111 (Pt shaking&agitated while taking BP,will recheck,RN notified)  MAP (mmHg) 140  BP Location Left Arm  BP Method Automatic  Patient Position (if appropriate) Lying  Pulse Rate (!) 116  Resp 18  Oxygen Therapy  SpO2 93 %  MEWS Score  MEWS Temp 0  MEWS Systolic 2  MEWS Pulse 2  MEWS RR 0  MEWS LOC 0  MEWS Score 4  MEWS Score Color Red   Pt re-assessed at 18:00, Pt sitting in bed, continues to complain 10/10 pain, scheduled tylenol given, will continue to monitor.

## 2020-06-18 NOTE — Progress Notes (Signed)
06/18/20 1058  Assess: MEWS Score  Temp 99.2 F (37.3 C)  BP (!) 216/119  Pulse Rate (!) 115  Resp 20  Level of Consciousness Alert  SpO2 98 %  O2 Device Room Air  Assess: MEWS Score  MEWS Temp 0  MEWS Systolic 2  MEWS Pulse 2  MEWS RR 0  MEWS LOC 0  MEWS Score 4  MEWS Score Color Red  Assess: if the MEWS score is Yellow or Red  Were vital signs taken at a resting state? Yes  Focused Assessment Documented focused assessment  Early Detection of Sepsis Score *See Row Information* Low  MEWS guidelines implemented *See Row Information* Yes  Treat  MEWS Interventions Administered prn meds/treatments;Administered scheduled meds/treatments  Take Vital Signs  Increase Vital Sign Frequency  Yellow: Q 2hr X 2 then Q 4hr X 2, if remains yellow, continue Q 4hrs  Escalate  MEWS: Escalate Yellow: discuss with charge nurse/RN and consider discussing with provider and RRT  Notify: Charge Nurse/RN  Name of Charge Nurse/RN Notified Aram Beecham, RN  Date Charge Nurse/RN Notified 06/18/20  Time Charge Nurse/RN Notified 1059  Notify: Provider  Provider Name/Title Lambert Mody, PA  Date Provider Notified 06/18/20  Time Provider Notified 1108  Notification Type Call  Notification Reason Other (Comment) (Elevated BP&HR at time of admission)  Response See new orders;Other (Comment) (hospitalist consult, PRN pain meds)  Date of Provider Response 06/18/20  Time of Provider Response 1109  Notify: Rapid Response  Name of Rapid Response RN Notified N/A  Document  Patient Outcome Stabilized after interventions  Progress note created (see row info) Yes     06/18/20 1058  Assess: MEWS Score  Temp 99.2 F (37.3 C)  BP (!) 216/119  Pulse Rate (!) 115  Resp 20  Level of Consciousness Alert  SpO2 98 %  O2 Device Room Air  Assess: MEWS Score  MEWS Temp 0  MEWS Systolic 2  MEWS Pulse 2  MEWS RR 0  MEWS LOC 0  MEWS Score 4  MEWS Score Color Red  Assess: if the MEWS score is Yellow or  Red  Were vital signs taken at a resting state? Yes  Focused Assessment Documented focused assessment  Early Detection of Sepsis Score *See Row Information* Low  MEWS guidelines implemented *See Row Information* Yes  Treat  MEWS Interventions Administered prn meds/treatments;Administered scheduled meds/treatments  Take Vital Signs  Increase Vital Sign Frequency  Yellow: Q 2hr X 2 then Q 4hr X 2, if remains yellow, continue Q 4hrs  Escalate  MEWS: Escalate Yellow: discuss with charge nurse/RN and consider discussing with provider and RRT  Notify: Charge Nurse/RN  Name of Charge Nurse/RN Notified Aram Beecham, RN  Date Charge Nurse/RN Notified 06/18/20  Time Charge Nurse/RN Notified 1059  Notify: Provider  Provider Name/Title Lambert Mody, PA  Date Provider Notified 06/18/20  Time Provider Notified 1108  Notification Type Call  Notification Reason Other (Comment) (Elevated BP&HR at time of admission)  Response See new orders;Other (Comment) (hospitalist consult, PRN pain meds)  Date of Provider Response 06/18/20  Time of Provider Response 1109  Notify: Rapid Response  Name of Rapid Response RN Notified N/A  Document  Patient Outcome Stabilized after interventions  Progress note created (see row info) Yes   Charge RN and [place], PA notified, new orders received, vital signs rechecked and currently monitored, PRN pain med given, Pt now complaining of 4/10 pain to R arm, Pt assisted to bathroom at 13:00, voided, Pt was given  a bath then assisted back to bed. Pt now resting comfortably in bed, states she feels a little better. Will continue to monitor.

## 2020-06-18 NOTE — H&P (Signed)
Erin Jackson is an 54 y.o. female.   Chief Complaint: Right hand hurts  HPI: Erin Jackson is a 54 y/o right hand dominant female who underwent a CMC arthroplasty with tendon transfer and a carpal tunnel release of the right extremity on 06/17/20.  She remained at the outpatient recovery care center overnight for pain control.  Throughout the day and overnight, her pain increased as did her blood pressure and heart rate.  The next morning her pain continued to increase in her blood pressure was still uncontrolled despite management with medications.  Transfer to the hospital was coordinated for pain control and blood pressure management. She notes generalized pain throughout the hand and jumps with light palpation.  She denies numbness or tingling.  She denies chest pain, shortness of breath, fever, chills, nausea, vomiting, or diarrhea.  Past Medical History:  Diagnosis Date  . Anxiety   . Chronic back pain   . Diabetes mellitus without complication ([place])   . GERD (gastroesophageal reflux disease)   . Hypertension   . Sleep apnea    was on CPAP but none in over 74yr; reports using it now    Past Surgical History:  Procedure Laterality Date  . ABDOMINAL HYSTERECTOMY    . BREAST REDUCTION SURGERY Bilateral 08/15/2018   Procedure: BILATERAL MAMMARY REDUCTION  (BREAST);  Surgeon: TCristine Polio MD;  [facility] [facility]  Service: Plastics;  Laterality: Bilateral;  . BREAST REDUCTION SURGERY Left 09/25/2019   Procedure: EXCISION OF FAT NECROSIS OF LEFT BREAST;  Surgeon: TCristine Polio MD;  Location[facility]  Service: Plastics;  Laterality: Left;  . CERVICAL SPINE SURGERY  2015   has a metal plate  . FOOT SURGERY Left 2014  . HAND SURGERY    . REDUCTION MAMMAPLASTY Bilateral 07/2018    Family History  Problem Relation Age of Onset  . Breast cancer Maternal Aunt   . Breast cancer Maternal Grandmother    Social History:  reports that she has been  smoking. She has been smoking about 0.50 packs per day. She has never used smokeless tobacco. She reports current alcohol use. She reports current drug use. Drug: Marijuana.  Allergies:  Allergies  Allergen Reactions  . Citrus Shortness Of Breath  . Cucumber Extract Anaphylaxis  . Norvasc [Amlodipine] Other (See Comments)    Muscle became very tight  . Apple   . Other Other (See Comments)    Tree nuts, walnuts  . Aspirin Rash  . Ibuprofen Rash  . Niacin And Related Rash    Medications Prior to Admission  Medication Sig Dispense Refill  . albuterol (VENTOLIN HFA) 108 (90 Base) MCG/ACT inhaler Inhale 1 puff into the lungs every 6 (six) hours as needed for shortness of breath.     . allopurinol (ZYLOPRIM) 100 MG tablet Take 100 mg by mouth daily.     .Marland Kitchenalprazolam (XANAX) 2 MG tablet Take 2 mg by mouth 2 (two) times daily as needed for sleep.    .Marland Kitchenatorvastatin (LIPITOR) 40 MG tablet     . cloNIDine (CATAPRES - DOSED IN MG/24 HR) 0.2 mg/24hr patch Place onto the skin.    . cloNIDine (CATAPRES) 0.2 MG tablet Take 0.4 mg by mouth 2 (two) times daily.     .Marland Kitchendicyclomine (BENTYL) 20 MG tablet     . EPINEPHRINE 0.3 mg/0.3 mL IJ SOAJ injection Inject 0.3 mLs (0.3 mg total) into the muscle once as needed (allergic reaction). 1 Device 1  . febuxostat (  ULORIC) 40 MG tablet Take 1 tablet (40 mg total) by mouth daily. 30 tablet 6  . fluticasone (FLONASE) 50 MCG/ACT nasal spray Place 2 sprays into both nostrils daily. 16 g 0  . glipiZIDE (GLUCOTROL) 10 MG tablet     . HYDROcodone-acetaminophen (NORCO) 10-325 MG tablet Take 1 tablet by mouth 2 (two) times daily.    Marland Kitchen JANUVIA 100 MG tablet     . metFORMIN (GLUCOPHAGE) 1000 MG tablet     . metFORMIN (GLUCOPHAGE) 500 MG tablet Take 500 mg by mouth 2 (two) times daily with a meal.    . NARCAN 4 MG/0.1ML LIQD nasal spray kit     . omeprazole (PRILOSEC) 20 MG capsule Take 1 capsule (20 mg total) by mouth daily. 30 capsule 0  . omeprazole (PRILOSEC) 40 MG  capsule     . oxyCODONE-acetaminophen (PERCOCET/ROXICET) 5-325 MG tablet Take by mouth.    Marland Kitchen tiZANidine (ZANAFLEX) 4 MG tablet     . traZODone (DESYREL) 150 MG tablet Take 150 mg by mouth at bedtime.    Marland Kitchen XTAMPZA ER 18 MG C12A     . zolpidem (AMBIEN) 5 MG tablet       Results for orders placed or performed during the hospital encounter of 06/18/20 (from the past 48 hour(s))  CBC     Status: Abnormal   Collection Time: 06/18/20 11:39 AM  Result Value Ref Range   WBC 11.9 (H) 4.0 - 10.5 K/uL   RBC 3.90 3.87 - 5.11 MIL/uL   Hemoglobin 11.1 (L) 12.0 - 15.0 g/dL   HCT 36.3 36 - 46 %   MCV 93.1 80.0 - 100.0 fL   MCH 28.5 26.0 - 34.0 pg   MCHC 30.6 30.0 - 36.0 g/dL   RDW 13.7 11.5 - 15.5 %   Platelets 181 150 - 400 K/uL   nRBC 0.0 0.0 - 0.2 %    Comment: Performed at [facility], [place] [address], [place], [place] [address]  Comprehensive metabolic panel     Status: Abnormal   Collection Time: 06/18/20 11:39 AM  Result Value Ref Range   Sodium 134 (L) 135 - 145 mmol/L   Potassium 4.5 3.5 - 5.1 mmol/L   Chloride 101 98 - 111 mmol/L   CO2 25 22 - 32 mmol/L   Glucose, Bld 192 (H) 70 - 99 mg/dL    Comment: Glucose reference range applies only to samples taken after fasting for at least 8 hours.   BUN 10 6 - 20 mg/dL   Creatinine, Ser 0.96 0.44 - 1.00 mg/dL   Calcium 9.0 8.9 - 10.3 mg/dL   Total Protein 7.4 6.5 - 8.1 g/dL   Albumin 3.3 (L) 3.5 - 5.0 g/dL   AST 24 15 - 41 U/L   ALT 25 0 - 44 U/L   Alkaline Phosphatase 68 38 - 126 U/L   Total Bilirubin 0.5 0.3 - 1.2 mg/dL   GFR calc non Af Amer >60 >60 mL/min   GFR calc Af Amer >60 >60 mL/min   Anion gap 8 5 - 15    Comment: Performed at [facility] [address] [place], [place] [address]  HIV Antibody (routine testing w rflx)     Status: None   Collection Time: 06/18/20 11:54 AM  Result Value Ref Range   HIV Screen 4th Generation wRfx Non Reactive Non Reactive    Comment: Performed at [facility], [place] [address], [place],  [address]  SARS Coronavirus 2 by RT PCR (hospital order, performed in [facility] hospital lab) Nasopharyngeal Nasopharyngeal Swab     Status: None   Collection Time: 06/18/20  3:18 PM   Specimen: Nasopharyngeal Swab  Result Value Ref Range   SARS Coronavirus 2 NEGATIVE NEGATIVE    Comment: (NOTE) SARS-CoV-2 target nucleic acids are NOT DETECTED.  The SARS-CoV-2 RNA is generally detectable in upper and lower respiratory specimens during the acute phase of infection. The lowest concentration of SARS-CoV-2 viral copies this assay can detect is 250 copies / mL. A negative result does not preclude SARS-CoV-2 infection and should not be used as the sole basis for treatment or other patient management decisions.  A negative result may occur with improper specimen collection / handling, submission of specimen other than nasopharyngeal swab, presence of viral mutation(s) within the areas targeted by this assay, and inadequate number of viral copies (<250 copies / mL). A negative result must be combined with clinical observations, patient history, and epidemiological information.  Fact Sheet for Patients:   StrictlyIdeas.no  Fact Sheet for Healthcare Providers: BankingDealers.co.za  This test is not yet approved or  cleared by the [place] FDA and has been authorized for detection and/or diagnosis of SARS-CoV-2 by FDA under an Emergency Use Authorization (EUA).  This EUA will remain in effect (meaning this test can be used) for the duration of the COVID-19 declaration under Section 564(b)(1) of the Act, 21 U.S.C. section 360bbb-3(b)(1), unless the authorization is terminated or revoked sooner.  Performed at [facility], [place] [address], [place], [place] [address]   Glucose, capillary     Status: Abnormal   Collection Time: 06/18/20  4:33 PM  Result Value Ref Range   Glucose-Capillary 189 (H) 70 - 99 mg/dL     Comment: Glucose reference range applies only to samples taken after fasting for at least 8 hours.   No results found.  ROS NO RECENT ILLNESSES OR HOSPITALIZATIONS  Blood pressure (!) 192/99, pulse (!) 105, temperature 98.9 F (37.2 C), temperature source Oral, resp. rate 17, SpO2 98 %. Physical Exam  General Appearance:  Alert, cooperative, appears stated age  Head:  Normocephalic, without obvious abnormality, atraumatic  Eyes:  Pupils equal, conjunctiva/corneas clear,         Throat: Lips, mucosa, and tongue normal; teeth and gums normal  Neck: No visible masses     Lungs:   respirations unlabored  Chest Wall:  No tenderness or deformity  Heart:  Tachycardic  Abdomen:   Soft, non-tender,         Extremities: RUE - THUMB SPICA SPLINT IS CLEAN, DRY, AND INTACT. NO VISIBLE DRAINAGE ON SPLINT. MINIMAL SWELLING OF THE DIGITS. CAPILLARY REFILL LESS THAN 2 SECONDS. SENSATION INTACT TO LIGHT TOUCH. DIFFICULTY WIGGLING FREE DIGITS DUE TO ELEVATED PAIN. GENERALIZED TENDERNESS TO PALPATION THROUGHOUT HAND.  Pulses: 2+ and symmetric  Skin: Skin color, texture, turgor normal, no rashes or lesions     Neurologic: Normal    Assessment/Plan   Right CMC arthroplasty with tendon transfer and right carpal tunnel release - Keep the thumb spica splint intact and clean/dry - Elevate extremity as tolerated - Apply ice to extremity as needed - May work on gentle range of motion of the free digits - Continue with pain management as directed  Hypertension - Hospitalist team consulted. Will defer management to them   Brynda Peon 06/18/2020, 4:58 PM

## 2020-06-18 NOTE — Plan of Care (Signed)

## 2020-06-18 NOTE — Progress Notes (Signed)
Pt was shaking and agitated when BP and HR were being taken, complaining of pain 10/10, PRN pain med given, vital signs rechecked at 15:11. Pt now calm and more relaxed after pain med was given, continues to complain of pain 8/10, will continue to monitor.

## 2020-06-18 NOTE — Consult Note (Signed)
Medical Consultation   Erin Jackson  DZH:[id number]  DOB: 1966-12-10  DOA: 06/18/2020  PCP: Raymon Mutton., FNP   Outpatient Specialists: Samuella Cota - podiatry; Xu/Ortmann - orthopedics    Requesting physician: Orlan Leavens, - orthopedics  Reason for consultation: Surgery yesterday, kept overnight.  BP is "through the roof", pain is not well controlled.  Sending to the hospital for consult for primarily BP control.  She been given hydralazine and Dilaudid without ability to control pain or BP.   History of Present Illness: Erin Jackson is an 54 y.o. female with PMH of OSA; HTN; DM; and chronic back pain presenting from the surgery center after carpal tunnel release and CMC arthroplasty.  Her pain has been uncontrolled in her R arm and also with uncontrolled BP.  She does have h/o chronic pain in her back and takes opiates.  She was exceedingly somnolent at the time of my evaluation.   Review of Systems:  ROS As per HPI otherwise 10 point review of systems negative.  Limited by somnolence.   Past Medical History: Past Medical History:  Diagnosis Date  . Anxiety   . Chronic back pain   . Diabetes mellitus without complication (HCC)   . GERD (gastroesophageal reflux disease)   . Hypertension   . Sleep apnea    was on CPAP but none in over 55yrs; reports using it now    Past Surgical History: Past Surgical History:  Procedure Laterality Date  . ABDOMINAL HYSTERECTOMY    . BREAST REDUCTION SURGERY Bilateral 08/15/2018   Procedure: BILATERAL MAMMARY REDUCTION  (BREAST);  Surgeon: Louisa Second, MD;  Location: [facility];  Service: Plastics;  Laterality: Bilateral;  . BREAST REDUCTION SURGERY Left 09/25/2019   Procedure: EXCISION OF FAT NECROSIS OF LEFT BREAST;  Surgeon: Louisa Second, MD;  Location: [facility];  Service: Plastics;  Laterality: Left;  . CERVICAL SPINE SURGERY  2015   has a metal plate  . FOOT SURGERY Left 2014    . HAND SURGERY    . REDUCTION MAMMAPLASTY Bilateral 07/2018     Allergies:   Allergies  Allergen Reactions  . Citrus Shortness Of Breath  . Cucumber Extract Anaphylaxis  . Norvasc [Amlodipine] Other (See Comments)    Muscle became very tight  . Apple   . Other Other (See Comments)    Tree nuts, walnuts  . Aspirin Rash  . Ibuprofen Rash  . Niacin And Related Rash     Social History:  reports that she has been smoking. She has been smoking about 0.50 packs per day. She has never used smokeless tobacco. She reports current alcohol use. She reports current drug use. Drug: Marijuana.   Family History: Family History  Problem Relation Age of Onset  . Breast cancer Maternal Aunt   . Breast cancer Maternal Grandmother       Physical Exam: Vitals:   06/18/20 1058 06/18/20 1114 06/18/20 1228 06/18/20 1259  BP: (!) 216/119 (!) 191/93 (!) 193/110 (!) 190/97  Pulse: (!) 115 (!) 113 (!) 101 (!) 108  Resp: 20  17 18   Temp: 99.2 F (37.3 C)  99.3 F (37.4 C) 99.2 F (37.3 C)  TempSrc: Oral  Oral Oral  SpO2: 98%   100%    Constitutional: Alert and awake, oriented x3, not in any acute distress.  Sedated. Eyes:  EOMI, irises appear normal, anicteric sclera,  ENMT:  external ears and nose appear normal, normal hearing, Lips appear normal, oropharynx mucosa, tongue, posterior pharynx appear normal  Neck: neck appears normal, no masses, normal ROM, no thyromegaly, no JVD  CVS: S1-S2 clear, no murmur rubs or gallops, no LE edema, normal pedal pulses  Respiratory:  clear to auscultation bilaterally, no wheezing, rales or rhonchi. Respiratory effort normal. No accessory muscle use.  Abdomen: soft nontender, nondistended, normal bowel sounds, no hepatosplenomegaly, no hernias  Musculoskeletal: : no cyanosis, clubbing or edema noted bilaterally; R wrist is splinted with good apparent distal finger circulation Neuro: Cranial nerves II-XII grossly intact Psych:somnolent mood and affect,  mental status Skin: no rashes or lesions or ulcers, no induration or nodules    Data reviewed:  I have personally reviewed the recent labs and imaging studies  Pertinent Labs:   Glucose 192 Albumin 3.3 WBC 11.9 A1c 9.8 on 11/09/19   Inpatient Medications:   Scheduled Meds: . acetaminophen  1,000 mg Oral Q6H  . docusate sodium  100 mg Oral BID   Continuous Infusions: . dextrose 5 % and 0.45 % NaCl with KCl 20 mEq/L 10 mL/hr at 06/18/20 1327  . methocarbamol (ROBAXIN) IV       Radiological Exams on Admission: No results found.  Impression/Recommendations Principal Problem:   Osteoarthritis of carpometacarpal (CMC) joint of right thumb Active Problems:   Chronic neck pain   Hypertension associated with diabetes (HCC)   T2DM (type 2 diabetes mellitus) (HCC)   Cigarette nicotine dependence without complication  CMC joint arthritis/carpal tunnel -Patient is s/p carpal tunnel release and CMC arthroplasty -This was scheduled as a day surgery but she was monitored overnight at the surgery center due to uncontrolled pain and HTN -These issues were ongoing and so the patient was transferred to [facility] -Orthopedics will continue to manage this issue  Uncontrolled HTN -Patient is on significant medication for her BP -Suspect current tachycardia and hypertension are related to pain as well as Clonidine withdrawal -Will resume Catapres transdermal as well as PO  Chronic pain -I have reviewed this patient in the [place] Controlled Substances Reporting System.  She is receiving medications from two providers but appears to be taking them as prescribed. -She is at particularly high risk of opioid misuse, diversion, or overdose. -Her chronic pain may increase her pain medication requirement, but will need to be judicious in use of parenteral opioids. -Continue Trazodone -Continue Xanax but since it is used for sleep will change to QHS (instead of BID)  OSA -Continue CPAP  DM -Prior A1c  was 9.8, indicating very poor control -Will check A1c -hold Glucophage, Glucotrol, Januvia -Cover with moderate-scale SSI  Tobacco dependence -Encourage cessation.    -Patch ordered at patient request. -Continue prn Albuterol     Thank you for this consultation.  Our [facility] hospitalist team will follow the patient with you.   Time Spent: 50 minutes  Jonah Blue M.D. [facility] 06/18/2020, 2:09 PM

## 2020-06-19 ENCOUNTER — Other Ambulatory Visit: Payer: Self-pay

## 2020-06-19 ENCOUNTER — Encounter (HOSPITAL_COMMUNITY): Payer: Self-pay | Admitting: Orthopedic Surgery

## 2020-06-19 DIAGNOSIS — M1811 Unilateral primary osteoarthritis of first carpometacarpal joint, right hand: Secondary | ICD-10-CM | POA: Diagnosis not present

## 2020-06-19 LAB — HEMOGLOBIN A1C
Hgb A1c MFr Bld: 8 % — ABNORMAL HIGH (ref 4.8–5.6)
Mean Plasma Glucose: 183 mg/dL

## 2020-06-19 LAB — GLUCOSE, CAPILLARY
Glucose-Capillary: 311 mg/dL — ABNORMAL HIGH (ref 70–99)
Glucose-Capillary: 225 mg/dL — ABNORMAL HIGH (ref 70–99)

## 2020-06-19 NOTE — Discharge Instructions (Signed)
KEEP BANDAGE CLEAN AND DRY CALL OFFICE FOR F/U APPT [phone] KEEP HAND ELEVATED ABOVE HEART OK TO APPLY ICE TO OPERATIVE AREA CONTACT OFFICE IF ANY WORSENING PAIN OR CONCERNS.  Pain medications have been sent to the [facility] on [address].   Please call our office to follow up in 2 weeks.   Please follow up with your primary care physician to discuss elevated blood pressure.

## 2020-06-19 NOTE — Progress Notes (Signed)
Subjective:   The patient is 2 days s/p right thumb CMC arthroplasty with tendon transfer and right carpal tunnel release.   She is sitting comfortably in bed eating breakfast. She feels that her pain is still very high but is more tolerable than yesterday. She also notes that she was having a headache yesterday from the elevated blood pressure but that has resolved.  She describes her pain as a deep throbbing and occasional sharp pain.  She denies chest pain, shortness of breath, fever, chills, nausea, vomiting, or diarrhea.  Objective: Vital signs in last 24 hours: Temp:  [98.2 F (36.8 C)-99.3 F (37.4 C)] 99.1 F (37.3 C) (06/30 0820) Pulse Rate:  [88-116] 107 (06/30 0820) Resp:  [15-20] 20 (06/30 0820) BP: (102-216)/(62-119) 158/107 (06/30 0820) SpO2:  [93 %-100 %] 100 % (06/30 0820)  Intake/Output from previous day: 06/29 0701 - 06/30 0700 In: 519.9 [P.O.:480; I.V.:39.9] Out: -  Intake/Output this shift: No intake/output data recorded.  Recent Labs    06/18/20 1139  HGB 11.1*   Recent Labs    06/18/20 1139  WBC 11.9*  RBC 3.90  HCT 36.3  PLT 181   Recent Labs    06/18/20 1139  NA 134*  K 4.5  CL 101  CO2 25  BUN 10  CREATININE 0.96  GLUCOSE 192*  CALCIUM 9.0   No results for input(s): LABPT, INR in the last 72 hours.  AxO x 3 Splint is clean, dry and intact.  Capillary refill is less than 2 seconds. Sensation intact distally Intact pulses distally  Able to wiggle digits but is very apprehensive due to the pain.  Mild swelling of the digits.    Assessment/Plan:    Right CMC arthroplasty and carpal tunnel release -Continue with the splint keeping it clean and dry -Ambulate as tolerated -Encouraged patient to elevate the extremity and discussed the importance of gentle motion of the free digits -Continue with pain control as needed -From a surgical standpoint, she is able to go home once her blood pressure has stabilized. She will follow up in our  office in 2 weeks. Her post-operative prescriptions have already been sent to the pharmacy.    Hypertension -Continue with treatment per hospitalist    Karma Greaser 06/19/2020, 8:26 AM

## 2020-06-19 NOTE — Plan of Care (Signed)

## 2020-06-19 NOTE — Discharge Summary (Signed)
Physician Discharge Summary  Patient ID: NEOSHA SWITALSKI MRN: [id number] DOB/AGE: [birth year] 54 y.o.  Admit date: 06/18/2020 Discharge date: 06/19/20  **Patient will need to follow up with PCP this week to discuss blood pressure**  Admission Diagnoses: * No surgery found * Past Medical History:  Diagnosis Date  . Anxiety   . Chronic back pain   . Diabetes mellitus without complication ([place])   . GERD (gastroesophageal reflux disease)   . Hypertension   . Sleep apnea    was on CPAP but none in over 36yr; reports using it now    Discharge Diagnoses:  Principal Problem:   Osteoarthritis of carpometacarpal (CMC) joint of right thumb Active Problems:   Chronic neck pain   Hypertension associated with diabetes ([facility]   T2DM (type 2 diabetes mellitus) ([facility]   Cigarette nicotine dependence without complication   Surgeries:  Right thumb CMC arthroplasty with tendon transfer, Right carpal tunnel release   Consultants:   Discharged Condition: Improved  Hospital Course: SMARLAYSIA LENIGis an 54y.o. female who was admitted 06/18/2020 with a chief complaint of No chief complaint on file. , and found to have a diagnosis of Right thumb CMC osteoarthritis and Right carpal tunnel syndrome.  They [facility], Right carpal tunnel release and underwent .    They were given perioperative antibiotics:  Anti-infectives (From admission, onward)   None    .  They were given sequential compression devices, early ambulation, and Other (comment) ambulation for DVT prophylaxis.  Recent vital signs:  Patient Vitals for the past 24 hrs:  BP Temp Temp src Pulse Resp SpO2 Height Weight  06/19/20 1055 -- -- -- -- -- -- 5' 2.5" (1.588 m) 94.8 kg  06/19/20 0820 (!) 158/107 99.1 F (37.3 C) Oral (!) 107 20 100 % -- --  06/19/20 0300 140/70 98.4 F (36.9 C) Oral 91 17 97 % -- --  06/18/20 2300 (!) 141/78 98.8 F (37.1 C) Oral  93 18 98 % -- --  06/18/20 2038 (!) 151/75 99.3 F (37.4 C) Oral 93 18 100 % -- --  06/18/20 1851 102/62 98.2 F (36.8 C) Oral 88 15 95 % -- --  06/18/20 1511 (!) 192/99 98.9 F (37.2 C) Oral (!) 105 17 98 % -- --  06/18/20 1501 (!) 209/111 98.8 F (37.1 C) Oral (!) 116 18 93 % -- --  06/18/20 1259 (!) 190/97 99.2 F (37.3 C) Oral (!) 108 18 100 % -- --  06/18/20 1228 (!) 193/110 99.3 F (37.4 C) Oral (!) 101 17 -- -- --  .  Recent laboratory studies: No results found.  Discharge Medications:   Allergies as of 06/19/2020      Reactions   Citrus Shortness Of Breath   Cucumber Extract Anaphylaxis   Norvasc [amlodipine] Other (See Comments)   Muscle became very tight   Apple    Other Other (See Comments)   Tree nuts, walnuts   Aspirin Rash   Ibuprofen Rash   Niacin And Related Rash      Medication List    TAKE these medications   albuterol 108 (90 Base) MCG/ACT inhaler Commonly known as: VENTOLIN HFA Inhale 1 puff into the lungs every 6 (six) hours as needed for shortness of breath.   allopurinol 100 MG tablet Commonly known as: ZYLOPRIM Take 100 mg by mouth daily.   alprazolam 2 MG tablet Commonly known as: XANAX Take  2 mg by mouth 2 (two) times daily as needed for sleep.   atorvastatin 40 MG tablet Commonly known as: LIPITOR Take 40 mg by mouth every evening.   cloNIDine 0.2 MG tablet Commonly known as: CATAPRES Take 0.4 mg by mouth 2 (two) times daily.   dicyclomine 20 MG tablet Commonly known as: BENTYL Take 20 mg by mouth daily.   EPINEPHrine 0.3 mg/0.3 mL Soaj injection Commonly known as: EPI-PEN Inject 0.3 mLs (0.3 mg total) into the muscle once as needed (allergic reaction).   febuxostat 40 MG tablet Commonly known as: ULORIC Take 1 tablet (40 mg total) by mouth daily.   fluticasone 50 MCG/ACT nasal spray Commonly known as: FLONASE Place 2 sprays into both nostrils daily.   glipiZIDE 10 MG tablet Commonly known as: GLUCOTROL Take 10 mg by  mouth daily.   HYDROcodone-acetaminophen 10-325 MG tablet Commonly known as: NORCO Take 1 tablet by mouth 2 (two) times daily.   Januvia 100 MG tablet Generic drug: sitaGLIPtin Take 100 mg by mouth daily.   losartan 50 MG tablet Commonly known as: COZAAR Take 50 mg by mouth daily.   Narcan 4 MG/0.1ML Liqd nasal spray kit Generic drug: naloxone   omeprazole 20 MG capsule Commonly known as: PRILOSEC Take 1 capsule (20 mg total) by mouth daily.   oxyCODONE-acetaminophen 5-325 MG tablet Commonly known as: PERCOCET/ROXICET Take 1 tablet by mouth every 6 (six) hours as needed for moderate pain.   tiZANidine 4 MG tablet Commonly known as: ZANAFLEX Take 4 mg by mouth at bedtime.   traZODone 150 MG tablet Commonly known as: DESYREL Take 150 mg by mouth at bedtime.   zolpidem 5 MG tablet Commonly known as: AMBIEN Take 5 mg by mouth at bedtime as needed for sleep.       Diagnostic Studies: No results found.  They benefited maximally from their hospital stay and there were no complications.     Disposition: Discharge disposition: 01-Home or Self Care          Signed: Brynda Peon 06/19/2020, 11:50 AM

## 2020-06-19 NOTE — Progress Notes (Signed)
RT placed patient on CPAP HS. NO O2 bleed in needed. Patient tolerating well.  

## 2020-06-19 NOTE — Progress Notes (Addendum)
Seen and examined Major complaint is pain at the operative site BP stable-back on Clonidine  Ok to discharge from my perspective-would resume her usual antihypertensive and DM regimen on discharge.   Defer further to the primary service.

## 2020-06-19 NOTE — Plan of Care (Signed)

## 2020-06-19 NOTE — Progress Notes (Signed)
Erin Jackson to be Discharged home per MD order.  Discussed prescriptions and follow up appointments with the patient. Prescriptions were previously sent by MD to patient's pharmacy so no new prescriptions, medication list explained in detail. Pt verbalized understanding.  Allergies as of 06/19/2020      Reactions   Citrus Shortness Of Breath   Cucumber Extract Anaphylaxis   Norvasc [amlodipine] Other (See Comments)   Muscle became very tight   Apple    Other Other (See Comments)   Tree nuts, walnuts   Aspirin Rash   Ibuprofen Rash   Niacin And Related Rash      Medication List    TAKE these medications   albuterol 108 (90 Base) MCG/ACT inhaler Commonly known as: VENTOLIN HFA Inhale 1 puff into the lungs every 6 (six) hours as needed for shortness of breath.   allopurinol 100 MG tablet Commonly known as: ZYLOPRIM Take 100 mg by mouth daily.   alprazolam 2 MG tablet Commonly known as: XANAX Take 2 mg by mouth 2 (two) times daily as needed for sleep.   atorvastatin 40 MG tablet Commonly known as: LIPITOR Take 40 mg by mouth every evening.   cloNIDine 0.2 MG tablet Commonly known as: CATAPRES Take 0.4 mg by mouth 2 (two) times daily.   dicyclomine 20 MG tablet Commonly known as: BENTYL Take 20 mg by mouth daily.   EPINEPHrine 0.3 mg/0.3 mL Soaj injection Commonly known as: EPI-PEN Inject 0.3 mLs (0.3 mg total) into the muscle once as needed (allergic reaction).   febuxostat 40 MG tablet Commonly known as: ULORIC Take 1 tablet (40 mg total) by mouth daily.   fluticasone 50 MCG/ACT nasal spray Commonly known as: FLONASE Place 2 sprays into both nostrils daily.   glipiZIDE 10 MG tablet Commonly known as: GLUCOTROL Take 10 mg by mouth daily.   HYDROcodone-acetaminophen 10-325 MG tablet Commonly known as: NORCO Take 1 tablet by mouth 2 (two) times daily.   Januvia 100 MG tablet Generic drug: sitaGLIPtin Take 100 mg by mouth daily.   losartan 50 MG  tablet Commonly known as: COZAAR Take 50 mg by mouth daily.   Narcan 4 MG/0.1ML Liqd nasal spray kit Generic drug: naloxone   omeprazole 20 MG capsule Commonly known as: PRILOSEC Take 1 capsule (20 mg total) by mouth daily.   oxyCODONE-acetaminophen 5-325 MG tablet Commonly known as: PERCOCET/ROXICET Take 1 tablet by mouth every 6 (six) hours as needed for moderate pain.   tiZANidine 4 MG tablet Commonly known as: ZANAFLEX Take 4 mg by mouth at bedtime.   traZODone 150 MG tablet Commonly known as: DESYREL Take 150 mg by mouth at bedtime.   zolpidem 5 MG tablet Commonly known as: AMBIEN Take 5 mg by mouth at bedtime as needed for sleep.       Vitals:   06/19/20 0300 06/19/20 0820  BP: 140/70 (!) 158/107  Pulse: 91 (!) 107  Resp: 17 20  Temp: 98.4 F (36.9 C) 99.1 F (37.3 C)  SpO2: 97% 100%     V catheter discontinued, Site without signs and symptoms of complications. Dressing and pressure applied. Pt denies pain at this time. No complaints noted.  An After Visit Summary was printed and given to the patient. Patient escorted via Wheelchair, and Discharged home via patient called her ride service to pick her up, she got tired of waiting for them so asked to sit outside and wait, patient pushed outside to picnic table where she requested.  Erin  Jackson 06/19/2020 7:33 PM

## 2020-06-28 ENCOUNTER — Ambulatory Visit: Payer: Medicare HMO | Admitting: Podiatry

## 2020-07-11 ENCOUNTER — Ambulatory Visit (INDEPENDENT_AMBULATORY_CARE_PROVIDER_SITE_OTHER): Payer: Medicare HMO | Admitting: Podiatry

## 2020-07-11 DIAGNOSIS — Z5329 Procedure and treatment not carried out because of patient's decision for other reasons: Secondary | ICD-10-CM

## 2020-07-11 NOTE — Progress Notes (Signed)
No show for appt. 

## 2020-08-06 ENCOUNTER — Ambulatory Visit (INDEPENDENT_AMBULATORY_CARE_PROVIDER_SITE_OTHER): Payer: Medicare HMO | Admitting: Podiatry

## 2020-08-06 DIAGNOSIS — Z5329 Procedure and treatment not carried out because of patient's decision for other reasons: Secondary | ICD-10-CM

## 2020-08-06 NOTE — Progress Notes (Signed)
No show for appt. Second no show. 

## 2020-09-25 ENCOUNTER — Other Ambulatory Visit: Payer: Self-pay | Admitting: Family

## 2020-09-25 DIAGNOSIS — Z1231 Encounter for screening mammogram for malignant neoplasm of breast: Secondary | ICD-10-CM

## 2020-11-12 ENCOUNTER — Other Ambulatory Visit: Payer: Self-pay

## 2020-11-12 ENCOUNTER — Emergency Department (HOSPITAL_COMMUNITY)
Admission: EM | Admit: 2020-11-12 | Discharge: 2020-11-13 | Disposition: A | Discharge status: Home / Self Care | Payer: Medicare HMO | Attending: Emergency Medicine | Admitting: Emergency Medicine

## 2020-11-12 ENCOUNTER — Emergency Department (HOSPITAL_COMMUNITY): Payer: Medicare HMO

## 2020-11-12 ENCOUNTER — Encounter (HOSPITAL_COMMUNITY): Payer: Self-pay | Admitting: Emergency Medicine

## 2020-11-12 DIAGNOSIS — E1169 Type 2 diabetes mellitus with other specified complication: Secondary | ICD-10-CM | POA: Insufficient documentation

## 2020-11-12 DIAGNOSIS — I10 Essential (primary) hypertension: Secondary | ICD-10-CM | POA: Diagnosis not present

## 2020-11-12 DIAGNOSIS — R519 Headache, unspecified: Secondary | ICD-10-CM | POA: Insufficient documentation

## 2020-11-12 DIAGNOSIS — F17210 Nicotine dependence, cigarettes, uncomplicated: Secondary | ICD-10-CM | POA: Insufficient documentation

## 2020-11-12 DIAGNOSIS — Z79899 Other long term (current) drug therapy: Secondary | ICD-10-CM | POA: Diagnosis not present

## 2020-11-12 DIAGNOSIS — R42 Dizziness and giddiness: Secondary | ICD-10-CM

## 2020-11-12 DIAGNOSIS — Z7984 Long term (current) use of oral hypoglycemic drugs: Secondary | ICD-10-CM | POA: Insufficient documentation

## 2020-11-12 DIAGNOSIS — Z20822 Contact with and (suspected) exposure to covid-19: Secondary | ICD-10-CM | POA: Insufficient documentation

## 2020-11-12 LAB — CBC WITH DIFFERENTIAL/PLATELET
Abs Immature Granulocytes: 0.02 10*3/uL (ref 0.00–0.07)
Basophils Absolute: 0 10*3/uL (ref 0.0–0.1)
Basophils Relative: 0 %
Eosinophils Absolute: 0.2 10*3/uL (ref 0.0–0.5)
Eosinophils Relative: 2 %
HCT: 36.2 % (ref 36.0–46.0)
Hemoglobin: 11.5 g/dL — ABNORMAL LOW (ref 12.0–15.0)
Immature Granulocytes: 0 %
Lymphocytes Relative: 63 %
Lymphs Abs: 6 10*3/uL — ABNORMAL HIGH (ref 0.7–4.0)
MCH: 29.6 pg (ref 26.0–34.0)
MCHC: 31.8 g/dL (ref 30.0–36.0)
MCV: 93.3 fL (ref 80.0–100.0)
Monocytes Absolute: 0.4 10*3/uL (ref 0.1–1.0)
Monocytes Relative: 5 %
Neutro Abs: 2.8 10*3/uL (ref 1.7–7.7)
Neutrophils Relative %: 30 %
Platelets: 212 10*3/uL (ref 150–400)
RBC: 3.88 MIL/uL (ref 3.87–5.11)
RDW: 13.6 % (ref 11.5–15.5)
WBC: 9.4 10*3/uL (ref 4.0–10.5)
nRBC: 0 % (ref 0.0–0.2)

## 2020-11-12 LAB — COMPREHENSIVE METABOLIC PANEL
ALT: 13 U/L (ref 0–44)
AST: 18 U/L (ref 15–41)
Albumin: 3.9 g/dL (ref 3.5–5.0)
Alkaline Phosphatase: 54 U/L (ref 38–126)
Anion gap: 10 (ref 5–15)
BUN: 32 mg/dL — ABNORMAL HIGH (ref 6–20)
CO2: 18 mmol/L — ABNORMAL LOW (ref 22–32)
Calcium: 8.8 mg/dL — ABNORMAL LOW (ref 8.9–10.3)
Chloride: 108 mmol/L (ref 98–111)
Creatinine, Ser: 1.19 mg/dL — ABNORMAL HIGH (ref 0.44–1.00)
GFR, Estimated: 54 mL/min — ABNORMAL LOW (ref >60)
Glucose, Bld: 98 mg/dL (ref 70–99)
Potassium: 4.9 mmol/L (ref 3.5–5.1)
Sodium: 136 mmol/L (ref 135–145)
Total Bilirubin: 0.6 mg/dL (ref 0.3–1.2)
Total Protein: 7.3 g/dL (ref 6.5–8.1)

## 2020-11-12 LAB — RESPIRATORY PANEL BY RT PCR (FLU A&B, COVID)
Influenza A by PCR: NEGATIVE
Influenza B by PCR: NEGATIVE
SARS Coronavirus 2 by RT PCR: NEGATIVE

## 2020-11-12 MED ORDER — LACTATED RINGERS IV BOLUS
1000.0000 mL | Freq: Once | INTRAVENOUS | Status: AC
  Administered 2020-11-12: 1000 mL via INTRAVENOUS

## 2020-11-12 MED ORDER — ACETAMINOPHEN 500 MG PO TABS
1000.0000 mg | ORAL_TABLET | Freq: Once | ORAL | Status: AC
  Administered 2020-11-13: 1000 mg via ORAL
  Filled 2020-11-12: qty 2

## 2020-11-12 MED ORDER — LORAZEPAM 2 MG/ML IJ SOLN
0.5000 mg | Freq: Once | INTRAMUSCULAR | Status: AC
  Administered 2020-11-12: 0.5 mg via INTRAVENOUS
  Filled 2020-11-12: qty 1

## 2020-11-12 MED ORDER — METOCLOPRAMIDE HCL 5 MG/ML IJ SOLN
10.0000 mg | Freq: Once | INTRAMUSCULAR | Status: AC
  Administered 2020-11-13: 10 mg via INTRAVENOUS
  Filled 2020-11-12: qty 2

## 2020-11-12 NOTE — ED Provider Notes (Addendum)
[facility] EMERGENCY DEPARTMENT Provider Note   CSN: [id number] Arrival date & time: 11/12/20  2009     History Chief Complaint  Patient presents with  . Dizzy    Erin Jackson is a 54 y.o. female.  Patient is a 54 year old female with a history of hypertension, diabetes, GERD who is presenting today with complaints of dizziness.  Patient reports she woke up in the middle of the night last night and noticed she was nauseated but was finally able to get back to sleep.  However since getting up this morning she has noted that she is dizzy.  She describes the dizziness as feeling like things are moving in her vision which is made worse when she attempts to stand and walk.  She has to hold onto the wall and feels off balance.  This makes her nauseated but she has had no vomiting.  He has had very little to eat or drink today because of the sensation.  She also complains of a frontal headache.  She also reports that she feels lightheaded like she might pass out as well with the sensations.  No prior history of similar.  She denies any recent medication changes.  She has had no fever, cough, congestion or Covid-like symptoms.  No known sick contacts.  She denies any visual changes.  No abdominal pain.  No tinnitus or hearing changes.  The history is provided by the patient.       Past Medical History:  Diagnosis Date  . Anxiety   . Chronic back pain   . Diabetes mellitus without complication ([place])   . GERD (gastroesophageal reflux disease)   . Hypertension   . Sleep apnea    was on CPAP but none in over 4yr; reports using it now    Patient Active Problem List   Diagnosis Date Noted  . Osteoarthritis of carpometacarpal ([facility] joint of right thumb 06/18/2020  . Primary osteoarthritis of first carpometacarpal joint of right hand 11/09/2019  . Right carpal tunnel syndrome 11/09/2019  . De Quervain's tenosynovitis, right 11/09/2019  . Arthritis of carpometacarpal  ([facility]) joints of both thumbs 11/04/2017  . Chronic neck pain 08/20/2017  . Supraclavicular mass 08/20/2017  . Hypertension associated with diabetes ([facility] 08/20/2017  . T2DM (type 2 diabetes mellitus) ([facility] 08/20/2017  . GERD (gastroesophageal reflux disease) 08/20/2017  . Cigarette nicotine dependence without complication [id number]   Past Surgical History:  Procedure Laterality Date  . ABDOMINAL HYSTERECTOMY    . BREAST REDUCTION SURGERY Bilateral 08/15/2018   Procedure: BILATERAL MAMMARY REDUCTION  (BREAST);  Surgeon: TCristine Polio MD;  [facility]  Service: Plastics;  Laterality: Bilateral;  . BREAST REDUCTION SURGERY Left 09/25/2019   Procedure: EXCISION OF FAT NECROSIS OF LEFT BREAST;  Surgeon: TCristine Polio MD;  [facility]  Service: Plastics;  Laterality: Left;  . CARPAL TUNNEL RELEASE Right 06/17/2020   right thumb [facility] arthroplasty with tendon transfer and right carpal tunnel release.   . CERVICAL SPINE SURGERY  2015   has a metal plate  . FOOT SURGERY Left 2014  . HAND SURGERY    . REDUCTION MAMMAPLASTY Bilateral 07/2018     OB History   No obstetric history on file.     Family History  Problem Relation Age of Onset  . Breast cancer Maternal Aunt   . Breast cancer Maternal Grandmother     Social History   Tobacco Use  . Smoking status:  Current Every Day Smoker    Packs/day: 0.50  . Smokeless tobacco: Never Used  Vaping Use  . Vaping Use: Never used  Substance Use Topics  . Alcohol use: Yes    Comment: socially  . Drug use: Yes    Types: Marijuana    Comment: occasional    Home Medications Prior to Admission medications   Medication Sig Start Date End Date Taking? Authorizing Provider  albuterol (VENTOLIN HFA) 108 (90 Base) MCG/ACT inhaler Inhale 1 puff into the lungs every 6 (six) hours as needed for shortness of breath.  02/29/20   [provider]  allopurinol (ZYLOPRIM) 100 MG tablet  Take 100 mg by mouth daily.  02/29/20   [provider]  alprazolam Duanne Moron) 2 MG tablet Take 2 mg by mouth 2 (two) times daily as needed for sleep.    [provider]  atorvastatin (LIPITOR) 40 MG tablet Take 40 mg by mouth every evening.  03/10/20   [provider]  cloNIDine (CATAPRES) 0.2 MG tablet Take 0.4 mg by mouth 2 (two) times daily.     [provider]  dicyclomine (BENTYL) 20 MG tablet Take 20 mg by mouth daily.  02/12/20   [provider]  EPINEPHRINE 0.3 mg/0.3 mL IJ SOAJ injection Inject 0.3 mLs (0.3 mg total) into the muscle once as needed (allergic reaction). 10/04/18   Fabian November, MD  febuxostat (ULORIC) 40 MG tablet Take 1 tablet (40 mg total) by mouth daily. 11/13/19   Leandrew Koyanagi, MD  fluticasone (FLONASE) 50 MCG/ACT nasal spray Place 2 sprays into both nostrils daily. 03/13/17   Evalee Jefferson, PA-C  glipiZIDE (GLUCOTROL) 10 MG tablet Take 10 mg by mouth daily.  02/07/20   [provider]  HYDROcodone-acetaminophen (NORCO) 10-325 MG tablet Take 1 tablet by mouth 2 (two) times daily.    [provider]  JANUVIA 100 MG tablet Take 100 mg by mouth daily.  02/07/20   [provider]  losartan (COZAAR) 50 MG tablet Take 50 mg by mouth daily.    [provider]  NARCAN 4 MG/0.1ML LIQD nasal spray kit  01/08/20   [provider]  omeprazole (PRILOSEC) 20 MG capsule Take 1 capsule (20 mg total) by mouth daily. 11/01/16   Sherwood Gambler, MD  oxyCODONE-acetaminophen (PERCOCET/ROXICET) 5-325 MG tablet Take 1 tablet by mouth every 6 (six) hours as needed for moderate pain.  12/28/17   [provider]  tiZANidine (ZANAFLEX) 4 MG tablet Take 4 mg by mouth at bedtime.  02/12/20   [provider]  traZODone (DESYREL) 150 MG tablet Take 150 mg by mouth at bedtime.    [provider]  zolpidem (AMBIEN) 5 MG tablet Take 5 mg by mouth at bedtime as needed for sleep.  02/12/20   [provider]    Allergies    Citrus, Cucumber extract, Norvasc [amlodipine], Apple, Other, Aspirin, Ibuprofen, and Niacin and related  Review of Systems   Review of Systems  All other systems reviewed and are negative.   Physical Exam Updated Vital Signs BP (!) 153/96 (BP Location: Right Arm)   Pulse 87   Temp 98.3 F (36.8 C) (Oral)   Resp 18   Ht _0  (1.575 m)   Wt 92 kg   SpO2 100%   BMI 37.10 kg/m   Physical Exam Vitals and nursing note reviewed.  Constitutional:      General: She is not in acute distress.    Appearance: Normal  appearance. She is well-developed. She is obese.  HENT:     Head: Normocephalic and atraumatic.     Right Ear: Tympanic membrane normal.     Left Ear: Tympanic membrane normal.     Nose: Nose normal.     Mouth/Throat:     Mouth: Mucous membranes are moist.  Eyes:     General: No visual field deficit.    Extraocular Movements: Extraocular movements intact.     Pupils: Pupils are equal, round, and reactive to light.     Comments: No nystagmus  Cardiovascular:     Rate and Rhythm: Normal rate and regular rhythm.     Heart sounds: Normal heart sounds. No murmur heard.  No friction rub.  Pulmonary:     Effort: Pulmonary effort is normal.     Breath sounds: Normal breath sounds. No wheezing or rales.  Abdominal:     General: Bowel sounds are normal. There is no distension.     Palpations: Abdomen is soft.     Tenderness: There is no abdominal tenderness. There is no guarding or rebound.  Musculoskeletal:        General: No tenderness. Normal range of motion.     Right lower leg: No edema.     Left lower leg: No edema.     Comments: No edema  Skin:    General: Skin is warm and dry.     Findings: No rash.  Neurological:     Mental Status: She is alert and oriented to person, place, and time.     Cranial Nerves: No cranial nerve deficit, dysarthria or facial asymmetry.     Sensory: No sensory deficit.     Motor: Motor function is  intact. No weakness or pronator drift.     Coordination: Coordination normal. Finger-Nose-Finger Test and Heel to Shin Test normal.     Comments: Heel-to-shin is slow bilaterally and takes more effort that is accurate  Psychiatric:        Mood and Affect: Mood normal.        Behavior: Behavior normal.        Thought Content: Thought content normal.     ED Results / Procedures / Treatments   Labs (all labs ordered are listed, but only abnormal results are displayed) Labs Reviewed  CBC WITH DIFFERENTIAL/PLATELET - Abnormal; Notable for the following components:      Result Value   Hemoglobin 11.5 (*)    Lymphs Abs 6.0 (*)    All other components within normal limits  COMPREHENSIVE METABOLIC PANEL - Abnormal; Notable for the following components:   CO2 18 (*)    BUN 32 (*)    Creatinine, Ser 1.19 (*)    Calcium 8.8 (*)    GFR, Estimated 54 (*)    All other components within normal limits  RESPIRATORY PANEL BY RT PCR (FLU A&B, COVID)    EKG EKG Interpretation  Date/Time:  Wednesday November 13 2020 00:20:02 EST Ventricular Rate:  82 PR Interval:    QRS Duration: 89 QT Interval:  363 QTC Calculation: 424 R Axis:   5 Text Interpretation: Sinus rhythm Normal ECG Confirmed by Blanchie Dessert [phone]) on 11/13/2020 12:23:41 AM   Radiology MR BRAIN WO CONTRAST  Result Date: 11/12/2020 CLINICAL DATA:  Nonspecific dizziness EXAM: MRI HEAD WITHOUT CONTRAST TECHNIQUE: Multiplanar, multiecho pulse sequences of the brain and surrounding structures were obtained without intravenous contrast. COMPARISON:  None. FINDINGS: Brain: No acute infarct, acute hemorrhage or extra-axial collection. Normal white  matter signal. Normal volume of CSF spaces. No chronic microhemorrhage. Normal midline structures. Vascular: Major flow voids are preserved. Skull and upper cervical spine: Normal calvarium and skull base. Visualized upper cervical spine and soft tissues are normal. Sinuses/Orbits:No  paranasal sinus fluid levels or advanced mucosal thickening. No mastoid or middle ear effusion. Normal orbits. IMPRESSION: Normal brain MRI. Electronically Signed   By: Ulyses Jarred M.D.   On: 11/12/2020 23:07    Procedures Procedures (including critical care time)  Medications Ordered in ED Medications  lactated ringers bolus 1,000 mL (0 mLs Intravenous Paused 11/12/20 2223)  LORazepam (ATIVAN) injection 0.5 mg (0.5 mg Intravenous Given 11/12/20 2222)    ED Course  I have reviewed the triage vital signs and the nursing notes.  Pertinent labs & imaging results that were available during my care of the patient were reviewed by me and considered in my medical decision making (see chart for details).    MDM Rules/Calculators/A&P                          54 year old female with multiple risk factors presenting today with dizziness which seems to be more vertigo in nature.  She does not have any focal neuro findings on exam but does report difficulty walking.  Heel-to-shin is slow but does appear accurate.  Symptoms have been greater than 12 hours.  She denies any shortness of breath or infectious type symptoms and Covid is negative.  She denies any recent medication changes and vital signs are reassuring except for mild hypertension which is baseline for this patient.  She reports for the last 1 week she has not been feeling great but cannot give more specific details.  Her hemoglobin today is stable, white cell count is normal, CMP with normal blood sugar and mild AKI with creatinine of 1.19 today but normal anion gap.  Electrolytes within normal limits.  MRI of the brain today was negative for acute infarct due to.  Concern for possible stroke.  Suspect this is most likely peripheral vertigo.  Patient given IV fluids due to mild dehydration due to poor oral intake.  She was also given Ativan to see if that will help her symptoms.  Confirm that patient is able to ambulate.  12:04 AM Patient is  still having headache on my exam.  Will give headache cocktail with Reglan and Tylenol.  She is allergic to NSAIDs.  EKG is wnl.  Pt checked out to Dr. Leonette Monarch for re-evaluation after treatment and confirmation that she is able to ambulate without ataxia.  MDM Number of Diagnoses or Management Options   Amount and/or Complexity of Data Reviewed Clinical lab tests: ordered and reviewed Tests in the radiology section of CPT: ordered and reviewed Tests in the medicine section of CPT: ordered and reviewed Independent visualization of images, tracings, or specimens: yes  Risk of Complications, Morbidity, and/or Mortality Presenting problems: moderate Management options: moderate  Patient Progress Patient progress: stable    Final Clinical Impression(s) / ED Diagnoses Final diagnoses:  None    Rx / DC Orders ED Discharge Orders    None       Blanchie Dessert, MD 11/13/20 Lynnell Catalan    Blanchie Dessert, MD 11/13/20 [phone]

## 2020-11-12 NOTE — ED Notes (Signed)
Patient transported to MRI 

## 2020-11-12 NOTE — ED Triage Notes (Signed)
Patient reports feeling dizzy/unsteady this evening , denies emesis or fever , alert and oriented/respirations unlabored.

## 2020-11-13 ENCOUNTER — Other Ambulatory Visit: Payer: Self-pay

## 2020-11-13 DIAGNOSIS — R42 Dizziness and giddiness: Secondary | ICD-10-CM | POA: Diagnosis not present

## 2020-11-13 NOTE — ED Notes (Signed)
Pt resting comfortably with eyes closed at this time. Easily aroused with verbal and tactile stimuli.

## 2020-11-13 NOTE — ED Notes (Signed)
Pt ambulated self with steady gait to pt restroom in back, pt denies dizziness.

## 2020-11-13 NOTE — ED Provider Notes (Signed)
I assumed care of this patient.  Please see previous provider note for further details of Hx, PE.  Briefly patient is a 54 y.o. female who presented with dizziness. Work up including MRI was reassuring. Being treated symptomatically. Plan to reassess.  Patient now able to ambulate steadily. Feels much better.  The patient appears reasonably screened and/or stabilized for discharge and I doubt any other medical condition or other [facility] requiring further screening, evaluation, or treatment in the ED at this time prior to discharge. Safe for discharge with strict return precautions.  Disposition: Discharge  Condition: Good  I have discussed the results, Dx and Tx plan with the patient/family who expressed understanding and agree(s) with the plan. Discharge instructions discussed at length. The patient/family was given strict return precautions who verbalized understanding of the instructions. No further questions at time of discharge.    ED Discharge Orders    None       Follow Up: Raymon Mutton., FNP [address] [place] [address] [phone]  Call  To schedule an appointment for close follow up          Linday Rhodes, Amadeo Garnet, MD 11/13/20 [phone]

## 2021-01-10 ENCOUNTER — Other Ambulatory Visit: Payer: Self-pay | Admitting: Family

## 2021-01-10 DIAGNOSIS — M25511 Pain in right shoulder: Secondary | ICD-10-CM

## 2021-01-15 ENCOUNTER — Other Ambulatory Visit: Payer: Medicare HMO

## 2021-02-08 ENCOUNTER — Other Ambulatory Visit: Payer: Medicare Other

## 2021-02-22 ENCOUNTER — Other Ambulatory Visit: Payer: Medicare Other

## 2021-02-27 ENCOUNTER — Other Ambulatory Visit: Payer: Medicare Other

## 2021-03-01 ENCOUNTER — Other Ambulatory Visit: Payer: Medicare Other

## 2021-03-07 ENCOUNTER — Ambulatory Visit
Admission: RE | Admit: 2021-03-07 | Discharge: 2021-03-07 | Disposition: A | Discharge status: Home / Self Care | Payer: Medicare Other | Source: Ambulatory Visit | Attending: Family | Admitting: Family

## 2021-03-07 ENCOUNTER — Other Ambulatory Visit: Payer: Self-pay

## 2021-03-07 DIAGNOSIS — M25511 Pain in right shoulder: Secondary | ICD-10-CM

## 2021-08-03 ENCOUNTER — Other Ambulatory Visit: Payer: Self-pay

## 2021-08-03 ENCOUNTER — Emergency Department (HOSPITAL_COMMUNITY)
Admission: EM | Admit: 2021-08-03 | Discharge: 2021-08-03 | Disposition: A | Discharge status: Home / Self Care | Payer: Medicare Other | Attending: Emergency Medicine | Admitting: Emergency Medicine

## 2021-08-03 ENCOUNTER — Encounter (HOSPITAL_COMMUNITY): Payer: Self-pay

## 2021-08-03 DIAGNOSIS — Z79899 Other long term (current) drug therapy: Secondary | ICD-10-CM | POA: Insufficient documentation

## 2021-08-03 DIAGNOSIS — E119 Type 2 diabetes mellitus without complications: Secondary | ICD-10-CM | POA: Diagnosis not present

## 2021-08-03 DIAGNOSIS — F17210 Nicotine dependence, cigarettes, uncomplicated: Secondary | ICD-10-CM | POA: Diagnosis not present

## 2021-08-03 DIAGNOSIS — R1310 Dysphagia, unspecified: Secondary | ICD-10-CM | POA: Diagnosis present

## 2021-08-03 DIAGNOSIS — T7803XA Anaphylactic reaction due to other fish, initial encounter: Secondary | ICD-10-CM | POA: Insufficient documentation

## 2021-08-03 DIAGNOSIS — T7840XA Allergy, unspecified, initial encounter: Secondary | ICD-10-CM

## 2021-08-03 DIAGNOSIS — L299 Pruritus, unspecified: Secondary | ICD-10-CM | POA: Diagnosis not present

## 2021-08-03 DIAGNOSIS — I10 Essential (primary) hypertension: Secondary | ICD-10-CM | POA: Diagnosis not present

## 2021-08-03 DIAGNOSIS — R221 Localized swelling, mass and lump, neck: Secondary | ICD-10-CM | POA: Diagnosis not present

## 2021-08-03 DIAGNOSIS — Z7984 Long term (current) use of oral hypoglycemic drugs: Secondary | ICD-10-CM | POA: Insufficient documentation

## 2021-08-03 DIAGNOSIS — X58XXXA Exposure to other specified factors, initial encounter: Secondary | ICD-10-CM | POA: Diagnosis not present

## 2021-08-03 MED ORDER — FAMOTIDINE IN NACL 20-0.9 MG/50ML-% IV SOLN
20.0000 mg | Freq: Once | INTRAVENOUS | Status: AC
  Administered 2021-08-03: 20 mg via INTRAVENOUS
  Filled 2021-08-03: qty 50

## 2021-08-03 MED ORDER — SODIUM CHLORIDE 0.9 % IV BOLUS
1000.0000 mL | Freq: Once | INTRAVENOUS | Status: AC
  Administered 2021-08-03: 1000 mL via INTRAVENOUS

## 2021-08-03 MED ORDER — CETIRIZINE HCL 10 MG PO TABS: 10.0000 mg | ORAL_TABLET | Freq: Every day | ORAL | 0 refills | Status: DC

## 2021-08-03 MED ORDER — PREDNISONE 20 MG PO TABS: 40.0000 mg | ORAL_TABLET | Freq: Every day | ORAL | 0 refills | Status: AC

## 2021-08-03 MED ORDER — EPINEPHRINE 0.3 MG/0.3ML IJ SOAJ: 0.3000 mg | INTRAMUSCULAR | 1 refills | Status: DC | PRN

## 2021-08-03 NOTE — ED Triage Notes (Signed)
Pt states she just finished eating and begin to have an allergic reaction. Pt states first her eye started to swell, then she developed hives and she felt like her throat was closing.   EMS reports pt had wheezing.   EMS gave patient Solu Medrol 125mg  Epi 0.3mg  Benadryl 50mg  PO Albuterol 5mg  neb

## 2021-08-03 NOTE — Discharge Instructions (Addendum)
Please avoid known allergens, follow up with allergist.

## 2021-08-03 NOTE — ED Provider Notes (Signed)
Care assumed from Dr. Wallace Cullens at shift change pending continued observation.  See his note for full H&P.   Briefly this is a 55 yo female presenting for ?anaphylaxis vs allergic reaction. Has history of food allergies. She was cooking tonight and felt facial swelling, difficulty breathing,  EMS gave solu medrol 125 mg, epi 0.3 mg, benadryl 50 mg, and albuterol neb at 1900. In the ED patient given pepcid and 1 L NS. Plan is for 4 hours obs until 11pm.   Physical Exam  BP 138/72   Pulse 90   Temp 99 F (37.2 C) (Oral)   Resp 19   Ht 5\' 2"  (1.575 m)   Wt 89.8 kg   SpO2 98%   BMI 36.21 kg/m   Physical Exam PE: Constitutional: well-developed, well-nourished, no apparent distress HENT: normocephalic, atraumatic. no cervical adenopathy Cardiovascular: normal rate and rhythm, distal pulses intact Pulmonary/Chest: effort normal; breath sounds clear and equal bilaterally; no wheezes or rales Abdominal: soft and nontender Musculoskeletal: full ROM, no edema Neurological: alert with goal directed thinking Skin: warm and dry, no rash, no diaphoresis Psychiatric: normal mood and affect, normal behavior   ED Course/Procedures     No results found for this or any previous visit (from the past 24 hour(s)).   MDM  Patient received in sign out. Please see previous provider note to include MDM up to this point.   She was observed for 4 hours after receiving epi for possible anaphylaxis.  Reassessed patient at the 4-hour mark and she is feeling back to baseline. Prescriptions sent to pharmacy by previous provider for epi pen and prednisone.  Patient hypertensive prior to discharge and she tells me she did not take her blood pressure medicine today.  She plans to take it tomorrow.  Advised her to follow-up with PCP for blood pressure recheck as well. Patient stable to be discharged home and follow up with pcp as needed. Strict return precautions discussed.   Portions of this note were generated with  . Dictation errors may occur despite best attempts at proofreading.       Scientist, clinical (histocompatibility and immunogenetics), PA-C 08/03/21 2327    [id number], MD 08/05/21 [phone]

## 2021-08-03 NOTE — ED Provider Notes (Signed)
[facility] EMERGENCY DEPARTMENT Provider Note   CSN: [id number] Arrival date & time: 08/03/21  1900     History Chief Complaint  Patient presents with   Allergic Reaction    Erin Jackson is a 55 y.o. female.  27 female with history as below presenting for possible allergic reaction.  Patient reports she was cooking some fish and began to experience diffuse itching, she felt as though she had "lump in her throat."  She felt as though her eyelids were swollen.  No tongue or lip swelling.  No vomiting, chest pain or dyspnea.  He is able to tolerate her own secretions.  He took Benadryl at home which mom improved his symptoms but then she called EMS because she was worried.  EMS gave her epinephrine, Benadryl, steroids.  Albuterol.  Reports that she is feeling better at this time.  Able to tolerate room secretions and she is able to converse appropriately.  She reports that she did not use her EpiPen because it had been expired.  She is yet to follow-up with allergist.  Reports history of prior allergic reaction to apple peels in the past.  No ACEi use   The history is provided by the patient. No language interpreter was used.  Allergic Reaction Presenting symptoms: difficulty swallowing   Presenting symptoms: no rash       Past Medical History:  Diagnosis Date   Anxiety    Chronic back pain    Diabetes mellitus without complication (HCC)    GERD (gastroesophageal reflux disease)    Hypertension    Sleep apnea    was on CPAP but none in over 68yr; reports using it now    Patient Active Problem List   Diagnosis Date Noted   Osteoarthritis of carpometacarpal ([facility] joint of right thumb 06/18/2020   Primary osteoarthritis of first carpometacarpal joint of right hand 11/09/2019   Right carpal tunnel syndrome 11/09/2019   De Quervain's tenosynovitis, right 11/09/2019   Arthritis of carpometacarpal (CMC) joints of both thumbs 11/04/2017   Chronic neck pain  08/20/2017   Supraclavicular mass 08/20/2017   Hy[place] diabetes ([place] 08/20/2017   T2DM (type 2 diabetes mellitus) ([facility] 08/20/2017   GERD (gastroesophageal reflux disease) 08/20/2017   Cigarette nicotine dependence without complication [id number]   Past Surgical History:  Procedure Laterality Date   ABDOMINAL HYSTERECTOMY     BREAST REDUCTION SURGERY Bilateral 08/15/2018   Procedure: BILATERAL MAMMARY REDUCTION  (BREAST);  Surgeon: TCristine Polio MD;  [place]  Service: Plastics;  Laterality: Bilateral;   BREAST REDUCTION SURGERY Left 09/25/2019   Procedure: EXCISION OF FAT NECROSIS OF LEFT BREAST;  Surgeon: TCristine Polio MD;  [place]  Service: Plastics;  Laterality: Left;   CARPAL TUNNEL RELEASE Right 06/17/2020   right thumb CMC arthroplasty with tendon transfer and right carpal tunnel release.    CERVICAL SPINE SURGERY  2015   has a metal plate   FOOT SURGERY Left 2014   HAND SURGERY     REDUCTION MAMMAPLASTY Bilateral 07/2018     OB History   No obstetric history on file.     Family History  Problem Relation Age of Onset   Breast cancer Maternal Aunt    Breast cancer Maternal Grandmother     Social History   Tobacco Use   Smoking status: Every Day    Packs/day: 0.50    Types: Cigarettes   Smokeless tobacco: Never  Vaping Use   Vaping Use: Never used  Substance Use Topics   Alcohol use: Yes    Comment: socially   Drug use: Yes    Types: Marijuana    Comment: occasional    Home Medications Prior to Admission medications   Medication Sig Start Date End Date Taking? Authorizing Provider  cetirizine (ZYRTEC ALLERGY) 10 MG tablet Take 1 tablet (10 mg total) by mouth daily for 14 days. 08/03/21 08/17/21 Yes Wynona Dove A, DO  EPINEPHrine 0.3 mg/0.3 mL IJ SOAJ injection Inject 0.3 mg into the muscle as needed for anaphylaxis. 08/03/21  Yes Wynona Dove A, DO  predniSONE  (DELTASONE) 20 MG tablet Take 2 tablets (40 mg total) by mouth daily for 3 days. 08/03/21 08/06/21 Yes Wynona Dove A, DO  albuterol (VENTOLIN HFA) 108 (90 Base) MCG/ACT inhaler Inhale 1 puff into the lungs every 6 (six) hours as needed for shortness of breath.  02/29/20   [provider]  allopurinol (ZYLOPRIM) 100 MG tablet Take 100 mg by mouth daily.  02/29/20   [provider]  alprazolam Duanne Moron) 2 MG tablet Take 2 mg by mouth 2 (two) times daily as needed for sleep.    [provider]  atorvastatin (LIPITOR) 40 MG tablet Take 40 mg by mouth every evening.  03/10/20   [provider]  cloNIDine (CATAPRES) 0.2 MG tablet Take 0.4 mg by mouth 2 (two) times daily.     [provider]  dicyclomine (BENTYL) 20 MG tablet Take 20 mg by mouth daily.  02/12/20   [provider]  EPINEPHRINE 0.3 mg/0.3 mL IJ SOAJ injection Inject 0.3 mLs (0.3 mg total) into the muscle once as needed (allergic reaction). 10/04/18   Fabian November, MD  febuxostat (ULORIC) 40 MG tablet Take 1 tablet (40 mg total) by mouth daily. 11/13/19   Leandrew Koyanagi, MD  fluticasone (FLONASE) 50 MCG/ACT nasal spray Place 2 sprays into both nostrils daily. 03/13/17   Evalee Jefferson, PA-C  glipiZIDE (GLUCOTROL) 10 MG tablet Take 10 mg by mouth daily.  02/07/20   [provider]  HYDROcodone-acetaminophen (NORCO) 10-325 MG tablet Take 1 tablet by mouth 2 (two) times daily.    [provider]  JANUVIA 100 MG tablet Take 100 mg by mouth daily.  02/07/20   [provider]  losartan (COZAAR) 50 MG tablet Take 50 mg by mouth daily.    [provider]  NARCAN 4 MG/0.1ML LIQD nasal spray kit  01/08/20   [provider]  omeprazole (PRILOSEC) 20 MG capsule Take 1 capsule (20 mg total) by mouth daily. 11/01/16   Sherwood Gambler, MD  oxyCODONE-acetaminophen (PERCOCET/ROXICET) 5-325 MG tablet Take 1 tablet by mouth every 6 (six) hours as needed for moderate pain.  12/28/17    [provider]  tiZANidine (ZANAFLEX) 4 MG tablet Take 4 mg by mouth at bedtime.  02/12/20   [provider]  traZODone (DESYREL) 150 MG tablet Take 150 mg by mouth at bedtime.    [provider]  zolpidem (AMBIEN) 5 MG tablet Take 5 mg by mouth at bedtime as needed for sleep.  02/12/20   [provider]    Allergies    Citrus, Cucumber extract, Norvasc [amlodipine], Apple, Other, Aspirin, Ibuprofen, and Niacin and related  Review of Systems   Review of Systems  Constitutional:  Negative for chills and fever.  HENT:  Positive for trouble swallowing. Negative for facial swelling.   Eyes:  Negative for photophobia and  visual disturbance.  Respiratory:  Negative for cough and shortness of breath.   Cardiovascular:  Negative for chest pain and palpitations.  Gastrointestinal:  Negative for abdominal pain, nausea and vomiting.  Endocrine: Negative for polydipsia and polyuria.  Genitourinary:  Negative for difficulty urinating and hematuria.  Musculoskeletal:  Negative for gait problem and joint swelling.  Skin:  Negative for pallor and rash.       Itchy  Neurological:  Negative for syncope and headaches.  Psychiatric/Behavioral:  Negative for agitation and confusion.    Physical Exam Updated Vital Signs BP (!) 173/105   Pulse 87   Temp 99 F (37.2 C) (Oral)   Resp 15   Ht 5' 2" (1.575 m)   Wt 89.8 kg   SpO2 99%   BMI 36.21 kg/m   Physical Exam Vitals and nursing note reviewed.  Constitutional:      General: She is not in acute distress.    Appearance: Normal appearance.  HENT:     Head: Normocephalic and atraumatic.     Comments: No angioedema. No tongue swelling.  Stridor, no drooling.  Mild swelling to eyelids    Right Ear: External ear normal.     Left Ear: External ear normal.     Nose: Nose normal.     Mouth/Throat:     Mouth: Mucous membranes are moist.  Eyes:     General: No scleral icterus.       Right eye: No discharge.         Left eye: No discharge.     Extraocular Movements: Extraocular movements intact.     Conjunctiva/sclera: Conjunctivae normal.  Cardiovascular:     Rate and Rhythm: Normal rate and regular rhythm.     Pulses: Normal pulses.     Heart sounds: Normal heart sounds.  Pulmonary:     Effort: Pulmonary effort is normal. No respiratory distress.     Breath sounds: Normal breath sounds.  Abdominal:     General: Abdomen is flat.     Tenderness: There is no abdominal tenderness. There is no right CVA tenderness or left CVA tenderness.  Musculoskeletal:        General: Normal range of motion.     Cervical back: Normal range of motion.     Right lower leg: No edema.     Left lower leg: No edema.  Skin:    General: Skin is warm and dry.     Capillary Refill: Capillary refill takes less than 2 seconds.     Comments: No hives  Neurological:     Mental Status: She is alert.  Psychiatric:        Mood and Affect: Mood normal.        Behavior: Behavior normal.    ED Results / Procedures / Treatments   Labs (all labs ordered are listed, but only abnormal results are displayed) Labs Reviewed - No data to display  EKG None  Radiology No results found.  Procedures Procedures   Medications Ordered in ED Medications  famotidine (PEPCID) IVPB 20 mg premix (0 mg Intravenous Stopped 08/03/21 2108)  sodium chloride 0.9 % bolus 1,000 mL (1,000 mLs Intravenous New Bag/Given 08/03/21 2008)    ED Course  I have reviewed the triage vital signs and the nursing notes.  Pertinent labs & imaging results that were available during my care of the patient were reviewed by me and considered in my medical decision making (see chart for details).    MDM Rules/Calculators/A&P  55 year old well-appearing female presented ER secondary to concern for allergic reaction.  Patient is protecting her airway, no stridor, she is tolerating secretions, no drooling.  Respiratory  status is stable.  She is not hypoxic.  She is breathing comfortably on room air. She reports that she feels itchy, no visible rash.  Patient has received epinephrine, Benadryl, steroids.  Will give Pepcid, IV fluids.  When observation for 4 hours following epinephrine injection.  Patient stable at this time. Care signed out for 4 hour observation period. Will discharge  with epi pen, anti-histamine, short course of steroids and close f/u with allergist and PCP.   Final Clinical Impression(s) / ED Diagnoses Final diagnoses:  Allergic reaction, initial encounter    Rx / DC Orders ED Discharge Orders          Ordered    EPINEPHrine 0.3 mg/0.3 mL IJ SOAJ injection  As needed        08/03/21 2101    cetirizine (ZYRTEC ALLERGY) 10 MG tablet  Daily        08/03/21 2101    predniSONE (DELTASONE) 20 MG tablet  Daily        08/03/21 2101             Jeanell Sparrow, DO 08/03/21 2151

## 2022-01-06 ENCOUNTER — Other Ambulatory Visit: Payer: Self-pay

## 2022-01-06 ENCOUNTER — Other Ambulatory Visit: Payer: Self-pay | Admitting: Family

## 2022-01-06 ENCOUNTER — Ambulatory Visit
Admission: RE | Admit: 2022-01-06 | Discharge: 2022-01-06 | Disposition: A | Discharge status: Home / Self Care | Payer: Medicare Other | Source: Ambulatory Visit | Attending: Family | Admitting: Family

## 2022-01-06 DIAGNOSIS — M545 Low back pain, unspecified: Secondary | ICD-10-CM

## 2022-01-06 DIAGNOSIS — M25562 Pain in left knee: Secondary | ICD-10-CM

## 2022-03-12 ENCOUNTER — Other Ambulatory Visit: Payer: Self-pay

## 2022-03-12 ENCOUNTER — Encounter (HOSPITAL_BASED_OUTPATIENT_CLINIC_OR_DEPARTMENT_OTHER): Payer: Self-pay | Admitting: General Surgery

## 2022-03-24 ENCOUNTER — Encounter (HOSPITAL_BASED_OUTPATIENT_CLINIC_OR_DEPARTMENT_OTHER)
Admission: RE | Admit: 2022-03-24 | Discharge: 2022-03-24 | Disposition: A | Discharge status: Home / Self Care | Payer: Medicare Other | Source: Ambulatory Visit | Attending: General Surgery | Admitting: General Surgery

## 2022-03-24 DIAGNOSIS — N6321 Unspecified lump in the left breast, upper outer quadrant: Secondary | ICD-10-CM | POA: Diagnosis present

## 2022-03-24 DIAGNOSIS — I10 Essential (primary) hypertension: Secondary | ICD-10-CM | POA: Diagnosis not present

## 2022-03-24 DIAGNOSIS — Z01810 Encounter for preprocedural cardiovascular examination: Secondary | ICD-10-CM | POA: Insufficient documentation

## 2022-03-24 DIAGNOSIS — E119 Type 2 diabetes mellitus without complications: Secondary | ICD-10-CM | POA: Diagnosis not present

## 2022-03-24 DIAGNOSIS — K219 Gastro-esophageal reflux disease without esophagitis: Secondary | ICD-10-CM | POA: Diagnosis not present

## 2022-03-24 DIAGNOSIS — E785 Hyperlipidemia, unspecified: Secondary | ICD-10-CM | POA: Diagnosis not present

## 2022-03-24 DIAGNOSIS — Z6837 Body mass index (BMI) 37.0-37.9, adult: Secondary | ICD-10-CM | POA: Diagnosis not present

## 2022-03-24 DIAGNOSIS — F17210 Nicotine dependence, cigarettes, uncomplicated: Secondary | ICD-10-CM | POA: Diagnosis not present

## 2022-03-24 DIAGNOSIS — F419 Anxiety disorder, unspecified: Secondary | ICD-10-CM | POA: Diagnosis not present

## 2022-03-24 DIAGNOSIS — E669 Obesity, unspecified: Secondary | ICD-10-CM | POA: Diagnosis not present

## 2022-03-24 DIAGNOSIS — N641 Fat necrosis of breast: Secondary | ICD-10-CM | POA: Diagnosis not present

## 2022-03-24 DIAGNOSIS — G709 Myoneural disorder, unspecified: Secondary | ICD-10-CM | POA: Diagnosis not present

## 2022-03-24 LAB — BASIC METABOLIC PANEL
Anion gap: 8 (ref 5–15)
BUN: 16 mg/dL (ref 6–20)
CO2: 25 mmol/L (ref 22–32)
Calcium: 8.7 mg/dL — ABNORMAL LOW (ref 8.9–10.3)
Chloride: 106 mmol/L (ref 98–111)
Creatinine, Ser: 1.03 mg/dL — ABNORMAL HIGH (ref 0.44–1.00)
GFR, Estimated: >60 mL/min (ref >60)
Glucose, Bld: 163 mg/dL — ABNORMAL HIGH (ref 70–99)
Potassium: 4.1 mmol/L (ref 3.5–5.1)
Sodium: 139 mmol/L (ref 135–145)

## 2022-03-25 ENCOUNTER — Ambulatory Visit (HOSPITAL_BASED_OUTPATIENT_CLINIC_OR_DEPARTMENT_OTHER): Payer: Medicare Other | Admitting: Anesthesiology

## 2022-03-25 ENCOUNTER — Ambulatory Visit: Payer: Self-pay | Admitting: General Surgery

## 2022-03-25 ENCOUNTER — Encounter (HOSPITAL_BASED_OUTPATIENT_CLINIC_OR_DEPARTMENT_OTHER)
Admission: RE | Disposition: A | Discharge status: Home / Self Care | Payer: Self-pay | Source: Home / Self Care | Attending: General Surgery

## 2022-03-25 ENCOUNTER — Other Ambulatory Visit: Payer: Self-pay

## 2022-03-25 ENCOUNTER — Ambulatory Visit (HOSPITAL_BASED_OUTPATIENT_CLINIC_OR_DEPARTMENT_OTHER)
Admission: RE | Admit: 2022-03-25 | Discharge: 2022-03-25 | Disposition: A | Discharge status: Home / Self Care | Payer: Medicare Other | Attending: General Surgery | Admitting: General Surgery

## 2022-03-25 ENCOUNTER — Encounter (HOSPITAL_BASED_OUTPATIENT_CLINIC_OR_DEPARTMENT_OTHER): Payer: Self-pay | Admitting: General Surgery

## 2022-03-25 DIAGNOSIS — N6321 Unspecified lump in the left breast, upper outer quadrant: Secondary | ICD-10-CM | POA: Insufficient documentation

## 2022-03-25 DIAGNOSIS — E119 Type 2 diabetes mellitus without complications: Secondary | ICD-10-CM

## 2022-03-25 DIAGNOSIS — K219 Gastro-esophageal reflux disease without esophagitis: Secondary | ICD-10-CM | POA: Insufficient documentation

## 2022-03-25 DIAGNOSIS — E669 Obesity, unspecified: Secondary | ICD-10-CM | POA: Insufficient documentation

## 2022-03-25 DIAGNOSIS — F419 Anxiety disorder, unspecified: Secondary | ICD-10-CM | POA: Insufficient documentation

## 2022-03-25 DIAGNOSIS — E785 Hyperlipidemia, unspecified: Secondary | ICD-10-CM | POA: Insufficient documentation

## 2022-03-25 DIAGNOSIS — G709 Myoneural disorder, unspecified: Secondary | ICD-10-CM | POA: Insufficient documentation

## 2022-03-25 DIAGNOSIS — I10 Essential (primary) hypertension: Secondary | ICD-10-CM | POA: Insufficient documentation

## 2022-03-25 DIAGNOSIS — N641 Fat necrosis of breast: Secondary | ICD-10-CM | POA: Insufficient documentation

## 2022-03-25 DIAGNOSIS — Z6837 Body mass index (BMI) 37.0-37.9, adult: Secondary | ICD-10-CM | POA: Insufficient documentation

## 2022-03-25 DIAGNOSIS — F17210 Nicotine dependence, cigarettes, uncomplicated: Secondary | ICD-10-CM | POA: Insufficient documentation

## 2022-03-25 HISTORY — PX: BREAST LUMPECTOMY: SHX2

## 2022-03-25 LAB — GLUCOSE, CAPILLARY
Glucose-Capillary: 147 mg/dL — ABNORMAL HIGH (ref 70–99)
Glucose-Capillary: 169 mg/dL — ABNORMAL HIGH (ref 70–99)

## 2022-03-25 SURGERY — BREAST LUMPECTOMY
Anesthesia: General | Site: Breast | Laterality: Left

## 2022-03-25 MED ORDER — CHLORHEXIDINE GLUCONATE CLOTH 2 % EX PADS
6.0000 | MEDICATED_PAD | Freq: Once | CUTANEOUS | Status: DC
Start: 2022-03-25 — End: 2022-03-25

## 2022-03-25 MED ORDER — HYDROMORPHONE HCL 1 MG/ML IJ SOLN
INTRAMUSCULAR | Status: AC
  Filled 2022-03-25: qty 0.5

## 2022-03-25 MED ORDER — MIDAZOLAM HCL 2 MG/2ML IJ SOLN
INTRAMUSCULAR | Status: AC
  Filled 2022-03-25: qty 2

## 2022-03-25 MED ORDER — OXYCODONE HCL 5 MG PO TABS: 5.0000 mg | ORAL_TABLET | Freq: Four times a day (QID) | ORAL | 0 refills | Status: DC | PRN

## 2022-03-25 MED ORDER — CEFAZOLIN SODIUM-DEXTROSE 2-4 GM/100ML-% IV SOLN
INTRAVENOUS | Status: AC
  Filled 2022-03-25: qty 100

## 2022-03-25 MED ORDER — ACETAMINOPHEN 500 MG PO TABS
ORAL_TABLET | ORAL | Status: AC
  Filled 2022-03-25: qty 2

## 2022-03-25 MED ORDER — CHLORHEXIDINE GLUCONATE CLOTH 2 % EX PADS: 6.0000 | MEDICATED_PAD | Freq: Once | CUTANEOUS | Status: DC

## 2022-03-25 MED ORDER — 0.9 % SODIUM CHLORIDE (POUR BTL) OPTIME
TOPICAL | Status: DC | PRN
  Administered 2022-03-25: 120 mL

## 2022-03-25 MED ORDER — GABAPENTIN 300 MG PO CAPS
ORAL_CAPSULE | ORAL | Status: AC
  Filled 2022-03-25: qty 1

## 2022-03-25 MED ORDER — FENTANYL CITRATE (PF) 100 MCG/2ML IJ SOLN
INTRAMUSCULAR | Status: AC
  Filled 2022-03-25: qty 2

## 2022-03-25 MED ORDER — HYDROMORPHONE HCL 1 MG/ML IJ SOLN
0.5000 mg | INTRAMUSCULAR | Status: AC | PRN
  Administered 2022-03-25 (×2): 0.5 mg via INTRAVENOUS

## 2022-03-25 MED ORDER — ONDANSETRON HCL 4 MG/2ML IJ SOLN: 4.0000 mg | Freq: Once | INTRAMUSCULAR | Status: DC | PRN

## 2022-03-25 MED ORDER — DEXAMETHASONE SODIUM PHOSPHATE 10 MG/ML IJ SOLN
INTRAMUSCULAR | Status: AC
  Filled 2022-03-25: qty 1

## 2022-03-25 MED ORDER — CEFAZOLIN SODIUM-DEXTROSE 2-4 GM/100ML-% IV SOLN
2.0000 g | INTRAVENOUS | Status: AC
  Administered 2022-03-25: 2 g via INTRAVENOUS

## 2022-03-25 MED ORDER — CELECOXIB 200 MG PO CAPS
ORAL_CAPSULE | ORAL | Status: AC
  Filled 2022-03-25: qty 1

## 2022-03-25 MED ORDER — ONDANSETRON HCL 4 MG/2ML IJ SOLN
INTRAMUSCULAR | Status: AC
  Filled 2022-03-25: qty 2

## 2022-03-25 MED ORDER — FENTANYL CITRATE (PF) 100 MCG/2ML IJ SOLN
INTRAMUSCULAR | Status: DC | PRN
  Administered 2022-03-25: 50 ug via INTRAVENOUS

## 2022-03-25 MED ORDER — PROPOFOL 10 MG/ML IV BOLUS
INTRAVENOUS | Status: DC | PRN
  Administered 2022-03-25: 150 mg via INTRAVENOUS

## 2022-03-25 MED ORDER — LIDOCAINE 2% (20 MG/ML) 5 ML SYRINGE
INTRAMUSCULAR | Status: AC
  Filled 2022-03-25: qty 5

## 2022-03-25 MED ORDER — MIDAZOLAM HCL 5 MG/5ML IJ SOLN
INTRAMUSCULAR | Status: DC | PRN
Start: 2022-03-25 — End: 2022-03-25
  Administered 2022-03-25: 1 mg via INTRAVENOUS

## 2022-03-25 MED ORDER — OXYCODONE HCL 5 MG PO TABS: 5.0000 mg | ORAL_TABLET | Freq: Once | ORAL | Status: DC | PRN

## 2022-03-25 MED ORDER — LIDOCAINE HCL (CARDIAC) PF 100 MG/5ML IV SOSY
PREFILLED_SYRINGE | INTRAVENOUS | Status: DC | PRN
  Administered 2022-03-25: 60 mg via INTRAVENOUS

## 2022-03-25 MED ORDER — ONDANSETRON HCL 4 MG/2ML IJ SOLN
INTRAMUSCULAR | Status: DC | PRN
  Administered 2022-03-25: 4 mg via INTRAVENOUS

## 2022-03-25 MED ORDER — OXYCODONE HCL 5 MG/5ML PO SOLN: 5.0000 mg | Freq: Once | ORAL | Status: DC | PRN

## 2022-03-25 MED ORDER — FENTANYL CITRATE (PF) 100 MCG/2ML IJ SOLN
25.0000 ug | INTRAMUSCULAR | Status: DC | PRN
  Administered 2022-03-25 (×2): 50 ug via INTRAVENOUS

## 2022-03-25 MED ORDER — BUPIVACAINE-EPINEPHRINE 0.25% -1:200000 IJ SOLN
INTRAMUSCULAR | Status: DC | PRN
  Administered 2022-03-25: 8 mL

## 2022-03-25 MED ORDER — GABAPENTIN 300 MG PO CAPS
300.0000 mg | ORAL_CAPSULE | ORAL | Status: AC
  Administered 2022-03-25: 300 mg via ORAL

## 2022-03-25 MED ORDER — ACETAMINOPHEN 500 MG PO TABS
1000.0000 mg | ORAL_TABLET | ORAL | Status: AC
  Administered 2022-03-25: 1000 mg via ORAL

## 2022-03-25 MED ORDER — LACTATED RINGERS IV SOLN: INTRAVENOUS | Status: DC

## 2022-03-25 SURGICAL SUPPLY — 31 items
BLADE SURG 15 STRL LF DISP TIS (BLADE) ×1 IMPLANT
BLADE SURG 15 STRL SS (BLADE) ×2
CANISTER SUCT 1200ML W/VALVE (MISCELLANEOUS) ×2 IMPLANT
CHLORAPREP W/TINT 26 (MISCELLANEOUS) ×2 IMPLANT
COVER BACK TABLE 60X90IN (DRAPES) ×2 IMPLANT
COVER MAYO STAND STRL (DRAPES) ×2 IMPLANT
DERMABOND ADVANCED (GAUZE/BANDAGES/DRESSINGS) ×1
DERMABOND ADVANCED .7 DNX12 (GAUZE/BANDAGES/DRESSINGS) ×1 IMPLANT
DRAPE LAPAROSCOPIC ABDOMINAL (DRAPES) ×2 IMPLANT
DRAPE UTILITY XL STRL (DRAPES) ×2 IMPLANT
ELECT COATED BLADE 2.86 ST (ELECTRODE) ×2 IMPLANT
ELECT REM PT RETURN 9FT ADLT (ELECTROSURGICAL) ×2
ELECTRODE REM PT RTRN 9FT ADLT (ELECTROSURGICAL) ×1 IMPLANT
GLOVE SURG ENC MOIS LTX SZ7.5 (GLOVE) ×2 IMPLANT
GOWN STRL REUS W/ TWL LRG LVL3 (GOWN DISPOSABLE) ×2 IMPLANT
GOWN STRL REUS W/TWL LRG LVL3 (GOWN DISPOSABLE) ×4
KIT MARKER MARGIN INK (KITS) ×1 IMPLANT
NDL HYPO 25X1 1.5 SAFETY (NEEDLE) ×1 IMPLANT
NEEDLE HYPO 25X1 1.5 SAFETY (NEEDLE) ×2 IMPLANT
NS IRRIG 1000ML POUR BTL (IV SOLUTION) ×2 IMPLANT
PACK BASIN DAY SURGERY FS (CUSTOM PROCEDURE TRAY) ×2 IMPLANT
PENCIL SMOKE EVACUATOR (MISCELLANEOUS) ×2 IMPLANT
SLEEVE SCD COMPRESS KNEE MED (STOCKING) ×2 IMPLANT
SPIKE FLUID TRANSFER (MISCELLANEOUS) ×2 IMPLANT
SPONGE T-LAP 18X18 ~~LOC~~+RFID (SPONGE) ×2 IMPLANT
SUT MON AB 4-0 PC3 18 (SUTURE) ×2 IMPLANT
SUT VICRYL 3-0 CR8 SH (SUTURE) ×2 IMPLANT
SYR CONTROL 10ML LL (SYRINGE) ×2 IMPLANT
TOWEL GREEN STERILE FF (TOWEL DISPOSABLE) ×2 IMPLANT
TUBE CONNECTING 20X1/4 (TUBING) ×2 IMPLANT
YANKAUER SUCT BULB TIP NO VENT (SUCTIONS) ×2 IMPLANT

## 2022-03-25 NOTE — Anesthesia Preprocedure Evaluation (Addendum)
Anesthesia Evaluation  ?Patient identified by MRN, date of birth, ID band ?Patient awake ? ? ? ?Reviewed: ?Allergy & Precautions, NPO status , Patient's Chart, lab work & pertinent test results, reviewed documented beta blocker date and time  ? ?Airway ?Mallampati: I ? ?TM Distance: >3 FB ?Neck ROM: Full ? ? ? Dental ?no notable dental hx. ?(+) Teeth Intact, Dental Advisory Given ?  ?Pulmonary ?Current Smoker and Patient abstained from smoking.,  ?  ?Pulmonary exam normal ?breath sounds clear to auscultation ? ? ? ? ? ? Cardiovascular ?hypertension, Pt. on medications ?Normal cardiovascular exam ?Rhythm:Regular Rate:Normal ? ?EKG 03/24/22 ?Sinus Tachycardia, otherwise normal ?  ?Neuro/Psych ?Anxiety  Neuromuscular disease   ? GI/Hepatic ?Neg liver ROS, GERD  Medicated and Controlled,  ?Endo/Other  ?diabetes, Well Controlled, Type 2, Oral Hypoglycemic AgentsLeft breast mass ?Obesity ?Hyperlipidemia ? Renal/GU ?negative Renal ROS  ?negative genitourinary ?  ?Musculoskeletal ? ?(+) Arthritis , Osteoarthritis,  Chronic back pain  ? Abdominal ?(+) + obese,   ?Peds ? Hematology ?negative hematology ROS ?(+)   ?Anesthesia Other Findings ? ? Reproductive/Obstetrics ? ?  ? ? ? ? ? ? ? ? ? ? ? ? ? ?  ?  ? ? ? ? ? ? ? ?Anesthesia Physical ?Anesthesia Plan ? ?ASA: 2 ? ?Anesthesia Plan: General  ? ?Post-op Pain Management: Minimal or no pain anticipated, Tylenol PO (pre-op)* and Precedex  ? ?Induction: Intravenous ? ?PONV Risk Score and Plan: 3 and Treatment may vary due to age or medical condition, Midazolam and Ondansetron ? ?Airway Management Planned: LMA ? ?Additional Equipment:  ? ?Intra-op Plan:  ? ?Post-operative Plan: Extubation in OR ? ?Informed Consent: I have reviewed the patients History and Physical, chart, labs and discussed the procedure including the risks, benefits and alternatives for the proposed anesthesia with the patient or authorized representative who has indicated his/her  understanding and acceptance.  ? ? ? ?Dental advisory given ? ?Plan Discussed with: CRNA and Anesthesiologist ? ?Anesthesia Plan Comments:   ? ? ? ? ? ?Anesthesia Quick Evaluation                                  Anesthesia Evaluation  ?Patient identified by MRN, date of birth, ID band ?Patient awake ? ? ? ?Reviewed: ?Allergy & Precautions, NPO status , Patient's Chart, lab work & pertinent test results ? ?Airway ?Mallampati: I ? ? ? ? ? ? Dental ?no notable dental hx. ?(+) Teeth Intact ?  ?Pulmonary ?sleep apnea , Current Smoker and Patient abstained from smoking.,  ?  ?Pulmonary exam normal ?breath sounds clear to auscultation ? ? ? ? ? ? Cardiovascular ?hypertension, Normal cardiovascular exam ?Rhythm:Regular Rate:Normal ? ? ?  ?Neuro/Psych ?PSYCHIATRIC DISORDERS Anxiety negative neurological ROS ?   ? GI/Hepatic ?Neg liver ROS, GERD  Medicated,  ?Endo/Other  ?diabetes, Type 2, Oral Hypoglycemic AgentsMorbid obesity ? Renal/GU ?negative Renal ROS  ? ?  ?Musculoskeletal ?negative musculoskeletal ROS ?(+)  ? Abdominal ?(+) + obese,   ?Peds ? Hematology ?negative hematology ROS ?(+)   ?Anesthesia Other Findings ? ? Reproductive/Obstetrics ? ?  ? ? ? ? ? ? ? ? ? ? ? ? ? ?  ?  ? ? ? ? ? ? ? ?Anesthesia Physical ? ?Anesthesia Plan ? ?ASA: III ? ?Anesthesia Plan: General  ? ?Post-op Pain Management:   ? ?Induction: Intravenous ? ?PONV Risk Score and Plan: 3 and Ondansetron, Dexamethasone  and Midazolam ? ?Airway Management Planned: LMA and Oral ETT ? ?Additional Equipment: None ? ?Intra-op Plan:  ? ?Post-operative Plan: Extubation in OR ? ?Informed Consent: I have reviewed the patients History and Physical, chart, labs and discussed the procedure including the risks, benefits and alternatives for the proposed anesthesia with the patient or authorized representative who has indicated his/her understanding and acceptance.  ? ? ? ? ? ?Plan Discussed with: CRNA, Surgeon and Anesthesiologist ? ?Anesthesia Plan Comments:  ( ? ?)  ? ? ? ? ? ?Anesthesia Quick Evaluation ? ? ?

## 2022-03-25 NOTE — H&P (Signed)
?REFERRING PHYSICIAN: Dustin Folks, NP ? ?PROVIDER: Landry Corporal, MD ? ?MRN: [id number] ?DOB: October 17, 1966 ?Subjective  ? ?Chief Complaint: Breast Mass ? ? ?History of Present Illness: ?Erin Jackson is a 56 y.o. female who is seen today as an office consultation at the request of Dr. Tamala Julian for evaluation of Breast Mass ?.  ? ?We are asked to see the patient in consultation by Dr. Dustin Folks to evaluate her for a left breast mass. The patient is a 56 year old black female who had a breast reduction about 4 to 5 years ago by Dr. Towanda Malkin. Since that time she has felt a mass in the upper outer quadrant of the left breast. She states that a second surgery was performed to remove the mass but the mass is still present. She feels as though it has been getting larger and causing her some discomfort. She underwent a recent mammogram and ultrasound which felt like the mass was consistent with an oil cyst and appeared benign. She does not have any family history of breast cancer. She is smoking about a half a pack of cigarettes a day ? ?Review of Systems: ?A complete review of systems was obtained from the patient. I have reviewed this information and discussed as appropriate with the patient. See HPI as well for other ROS. ? ?ROS  ? ?Medical History: ?Past Medical History:  ?Diagnosis Date  ? Diabetes mellitus without complication (CMS-HCC)  ? Hypertension  ? ?Patient Active Problem List  ?Diagnosis  ? Mass of upper outer quadrant of left breast  ? ?Past Surgical History:  ?Procedure Laterality Date  ? Bilateral Mammary Reduction 08/15/2018  ?Dr. Lucretia Field  ? Excision of Fat Necrosis Of Left Breast 09/25/2019  ?Dr. Lucretia Field  ? ? ?Allergies  ?Allergen Reactions  ? Amlodipine Other (See Comments)  ?Muscle became very tight ?Muscle became very tight ? ? Citrus And Derivatives Shortness Of Breath  ? Cucumber Anaphylaxis  ? Apple Juice Unknown  ? Aspirin Rash and Unknown  ? Ibuprofen Rash and Unknown  ? Niacin Rash and  Unknown  ? ?Current Outpatient Medications on File Prior to Visit  ?Medication Sig Dispense Refill  ? albuterol 90 mcg/actuation inhaler 1 puff as needed  ? EPINEPHrine (EPIPEN) 0.3 mg/0.3 mL auto-injector as directed  ? fluticasone propionate (FLONASE) 50 mcg/actuation nasal spray 1 spray in each nostril  ? allopurinoL (ZYLOPRIM) 100 MG tablet  ? ALPRAZolam (XANAX) 2 MG tablet Take 2 mg by mouth 2 (two) times daily as needed  ? cloNIDine HCL (CATAPRES) 0.2 MG tablet  ? HYDROcodone-acetaminophen (NORCO) 10-325 mg tablet Take 1 tablet by mouth 3 (three) times daily as needed  ? metFORMIN (GLUCOPHAGE) 500 MG tablet  ? omeprazole (PRILOSEC) 40 MG DR capsule  ? traZODone (DESYREL) 150 MG tablet  ? XTAMPZA ER 13.5 mg ER capsule Take 13.5 mg by mouth 2 (two) times daily  ? ?No current facility-administered medications on file prior to visit.  ? ?Family History  ?Problem Relation Age of Onset  ? High blood pressure (Hypertension) Mother  ? Diabetes Mother  ? ? ?Social History  ? ?Tobacco Use  ?Smoking Status Every Day  ? Packs/day: 0.50  ? Types: Cigarettes  ?Smokeless Tobacco Never  ? ? ?Social History  ? ?Socioeconomic History  ? Marital status: Divorced  ?Tobacco Use  ? Smoking status: Every Day  ?Packs/day: 0.50  ?Types: Cigarettes  ? Smokeless tobacco: Never  ?Substance and Sexual Activity  ? Alcohol use: Yes  ?Comment:  Sometimes  ? Drug use: Never  ? ?Objective:  ? ?Vitals:  ?BP: (!) 158/90  ?Pulse: (!) 115  ?Temp: 36.6 ?C (97.8 ?F)  ?SpO2: 99%  ?Weight: 94 kg (207 lb 3.2 oz)  ?Height: 157.5 cm (5\' 2" )  ? ?Body mass index is 37.9 kg/m?. ? ?Physical Exam ?Vitals reviewed.  ?Constitutional:  ?General: She is not in acute distress. ?Appearance: Normal appearance.  ?HENT:  ?Head: Normocephalic and atraumatic.  ?Right Ear: External ear normal.  ?Left Ear: External ear normal.  ?Nose: Nose normal.  ?Mouth/Throat:  ?Mouth: Mucous membranes are moist.  ?Pharynx: Oropharynx is clear.  ?Eyes:  ?General: No scleral  icterus. ?Extraocular Movements: Extraocular movements intact.  ?Conjunctiva/sclera: Conjunctivae normal.  ?Pupils: Pupils are equal, round, and reactive to light.  ?Cardiovascular:  ?Rate and Rhythm: Normal rate and regular rhythm.  ?Pulses: Normal pulses.  ?Heart sounds: Normal heart sounds.  ?Pulmonary:  ?Effort: Pulmonary effort is normal. No respiratory distress.  ?Breath sounds: Normal breath sounds.  ?Abdominal:  ?General: Bowel sounds are normal.  ?Palpations: Abdomen is soft.  ?Tenderness: There is no abdominal tenderness.  ?Musculoskeletal:  ?General: No swelling, tenderness or deformity. Normal range of motion.  ?Cervical back: Normal range of motion and neck supple.  ?Skin: ?General: Skin is warm and dry.  ?Coloration: Skin is not jaundiced.  ?Neurological:  ?General: No focal deficit present.  ?Mental Status: She is alert and oriented to person, place, and time.  ?Psychiatric:  ?Mood and Affect: Mood normal.  ?Behavior: Behavior normal.  ? ? ? ?Breast: There is a 4 cm palpable mobile mass in the upper outer quadrant of the left breast. Other than this there is no other palpable mass in either breast. There is no palpable axillary, supraclavicular, or cervical lymphadenopathy. ? ?Labs, Imaging and Diagnostic Testing: ? ?Assessment and Plan:  ? ?Diagnoses and all orders for this visit: ? ?Mass of upper outer quadrant of left breast ?- [facility] Case Posting Request; Future ? ? ? ?The patient appears to have a 4 cm mass that appears benign in the upper outer quadrant of the left breast. Since it is enlarging and causing her discomfort I feel it is reasonable to remove it. She would also like to have this done. I have discussed with her in detail the risks and benefits of the operation as well as some of the technical aspects and she understands and wishes to proceed.  ?

## 2022-03-25 NOTE — Transfer of Care (Signed)
Immediate Anesthesia Transfer of Care Note ? ?Patient: Erin Jackson ? ?Procedure(s) Performed: LEFT BREAST LUMPECTOMY (Left: Breast) ? ?Patient Location: PACU ? ?Anesthesia Type:General ? ?Level of Consciousness: awake ? ?Airway & Oxygen Therapy: Patient Spontanous Breathing and Patient connected to face mask oxygen ? ?Post-op Assessment: Report given to RN and Post -op Vital signs reviewed and stable ? ?Post vital signs: Reviewed and stable ? ?Last Vitals:  ?Vitals Value Taken Time  ?BP 171/108 03/25/22 1027  ?Temp    ?Pulse 120 03/25/22 1028  ?Resp    ?SpO2 100 % 03/25/22 1028  ?Vitals shown include unvalidated device data. ? ?Last Pain:  ?Vitals:  ? 03/25/22 0822  ?TempSrc: Oral  ?PainSc: 5   ?   ? ?Patients Stated Pain Goal: 5 (03/25/22 0973) ? ?Complications: No notable events documented. ?

## 2022-03-25 NOTE — Anesthesia Postprocedure Evaluation (Deleted)
Anesthesia Post Note ? ?Patient: Erin Jackson ? ?Procedure(s) Performed: LEFT BREAST LUMPECTOMY (Left: Breast) ? ?  ? ?Patient location during evaluation: PACU ?Anesthesia Type: General ?Level of consciousness: awake and alert and oriented ?Pain management: pain level controlled ?Vital Signs Assessment: post-procedure vital signs reviewed and stable ?Respiratory status: spontaneous breathing, nonlabored ventilation and respiratory function stable ?Cardiovascular status: blood pressure returned to baseline and stable ?Postop Assessment: no apparent nausea or vomiting ?Anesthetic complications: no ? ? ?No notable events documented. ? ?Last Vitals:  ?Vitals:  ? 03/25/22 0822  ?BP: (!) 157/103  ?Pulse: (!) 103  ?Resp: 18  ?Temp: 36.7 ?C  ?  ?Last Pain:  ?Vitals:  ? 03/25/22 0822  ?TempSrc: Oral  ?PainSc: 5   ? ? ?  ?  ?  ?  ?  ?  ? ?Leny Morozov A. ? ? ? ? ?

## 2022-03-25 NOTE — Discharge Instructions (Addendum)
?  Post Anesthesia Home Care Instructions ? ?Activity: ?Get plenty of rest for the remainder of the day. A responsible individual must stay with you for 24 hours following the procedure.  ?For the next 24 hours, DO NOT: ?-Drive a car ?-Paediatric nurse ?-Drink alcoholic beverages ?-Take any medication unless instructed by your physician ?-Make any legal decisions or sign important papers. ? ?Meals: ?Start with liquid foods such as gelatin or soup. Progress to regular foods as tolerated. Avoid greasy, spicy, heavy foods. If nausea and/or vomiting occur, drink only clear liquids until the nausea and/or vomiting subsides. Call your physician if vomiting continues. ? ?Special Instructions/Symptoms: ?Your throat may feel dry or sore from the anesthesia or the breathing tube placed in your throat during surgery. If this causes discomfort, gargle with warm salt water. The discomfort should disappear within 24 hours. ? ?If you had a scopolamine patch placed behind your ear for the management of post- operative nausea and/or vomiting: ? ?1. The medication in the patch is effective for 72 hours, after which it should be removed.  Wrap patch in a tissue and discard in the trash. Wash hands thoroughly with soap and water. ?2. You may remove the patch earlier than 72 hours if you experience unpleasant side effects which may include dry mouth, dizziness or visual disturbances. ?3. Avoid touching the patch. Wash your hands with soap and water after contact with the patch. ?    ? ?Next dose of Tylenol can be given at 2:30pm if needed. ?

## 2022-03-25 NOTE — Interval H&P Note (Signed)
History and Physical Interval Note: ? ?03/25/2022 ?8:03 AM ? ?Erin Jackson  has presented today for surgery, with the diagnosis of LEFT BREAST MASS.  The various methods of treatment have been discussed with the patient and family. After consideration of risks, benefits and other options for treatment, the patient has consented to  Procedure(s): ?LEFT BREAST LUMPECTOMY (Left) as a surgical intervention.  The patient's history has been reviewed, patient examined, no change in status, stable for surgery.  I have reviewed the patient's chart and labs.  Questions were answered to the patient's satisfaction.   ? ? ?Chevis Pretty III ? ? ?

## 2022-03-25 NOTE — Anesthesia Postprocedure Evaluation (Signed)
Anesthesia Post Note ? ?Patient: Erin Jackson ? ?Procedure(s) Performed: LEFT BREAST LUMPECTOMY (Left: Breast) ? ?  ? ?Patient location during evaluation: PACU ?Anesthesia Type: General ?Level of consciousness: awake and alert and oriented ?Pain management: pain level controlled ?Vital Signs Assessment: post-procedure vital signs reviewed and stable ?Respiratory status: spontaneous breathing, nonlabored ventilation and respiratory function stable ?Cardiovascular status: blood pressure returned to baseline and stable ?Postop Assessment: no apparent nausea or vomiting ?Anesthetic complications: no ? ? ?No notable events documented. ? ?Last Vitals:  ?Vitals:  ? 03/25/22 1030 03/25/22 1045  ?BP: (!) 157/102 (!) 150/102  ?Pulse: (!) 114 (!) 105  ?Resp: 20 14  ?Temp: 36.6 ?C   ?SpO2: 100% 96%  ?  ?Last Pain:  ?Vitals:  ? 03/25/22 1041  ?TempSrc:   ?PainSc: 7   ? ? ?  ?  ?  ?  ?  ?  ? ?Terree Gaultney A. ? ? ? ? ?

## 2022-03-25 NOTE — Op Note (Signed)
03/25/2022 ? ?10:19 AM ? ?PATIENT:  Erin Jackson  56 y.o. female ? ?PRE-OPERATIVE DIAGNOSIS:  LEFT BREAST MASS ? ?POST-OPERATIVE DIAGNOSIS:  LEFT BREAST MASS ? ?PROCEDURE:  Procedure(s): ?LEFT BREAST LUMPECTOMY (Left) ? ?SURGEON:  Surgeon(s) and Role: ?   Griselda Miner, MD - Primary ? ?PHYSICIAN ASSISTANT:  ? ?ASSISTANTS: none  ? ?ANESTHESIA:   local and general ? ?EBL:  10 mL  ? ?BLOOD ADMINISTERED:none ? ?DRAINS: none  ? ?LOCAL MEDICATIONS USED:  MARCAINE    ? ?SPECIMEN:  Source of Specimen:  left breast tissue ? ?DISPOSITION OF SPECIMEN:  PATHOLOGY ? ?COUNTS:  YES ? ?TOURNIQUET:  * No tourniquets in log * ? ?DICTATION: .Dragon Dictation ? ?After informed consent was obtained the patient was brought to the operating room and placed in the supine position on the operating table.  After adequate induction of general anesthesia the patient's left breast was prepped with ChloraPrep, allowed to dry, and draped in usual sterile manner.  An appropriate timeout was performed.  The patient had a palpable mass in the upper outer quadrant of the left breast that was felt to be benign but is enlarging.  She presents today to have it removed.  The area around this was infiltrated with quarter percent Marcaine.  A small incision was made near the anterior axilla very close to where the palpable mass was.  The incision was carried through the skin and subcutaneous tissue sharply with the electrocautery.  Dissection was then carried around the palpable mass until it was completely removed.  The mass measured about 5 cm in diameter.  The mass was then oriented with the appropriate paint colors and sent to pathology for further evaluation.  Hemostasis was achieved using the Bovie electrocautery.  The wound was irrigated with saline and infiltrated with more quarter percent Marcaine.  The deep layer of the wound was closed with interrupted 3-0 Vicryl stitches.  The skin was then closed with a running 4-0 Monocryl subcuticular  stitch.  Dermabond dressings were applied.  The patient tolerated the procedure well.  At the end of the case all needle sponge and instrument counts were correct.  The patient was then awakened and taken to recovery in stable condition. ? ?PLAN OF CARE: Discharge to home after PACU ? ?PATIENT DISPOSITION:  PACU - hemodynamically stable. ?  ?Delay start of Pharmacological VTE agent (>24hrs) due to surgical blood loss or risk of bleeding: not applicable ? ?

## 2022-03-25 NOTE — Anesthesia Procedure Notes (Signed)
Procedure Name: LMA Insertion ?Date/Time: 03/25/2022 9:44 AM ?Performed by: Lance Coon, CRNA ?Pre-anesthesia Checklist: Patient identified, Emergency Drugs available, Suction available and Patient being monitored ?Patient Re-evaluated:Patient Re-evaluated prior to induction ?Oxygen Delivery Method: Circle system utilized ?Preoxygenation: Pre-oxygenation with 100% oxygen ?Induction Type: IV induction ?Ventilation: Mask ventilation without difficulty ?LMA: LMA inserted ?LMA Size: 4.0 ?Number of attempts: 1 ?Airway Equipment and Method: Bite block ?Placement Confirmation: positive ETCO2 ?Tube secured with: Tape ?Dental Injury: Teeth and Oropharynx as per pre-operative assessment  ? ? ? ? ?

## 2022-03-26 ENCOUNTER — Encounter (HOSPITAL_BASED_OUTPATIENT_CLINIC_OR_DEPARTMENT_OTHER): Payer: Self-pay | Admitting: General Surgery

## 2022-03-27 LAB — SURGICAL PATHOLOGY

## 2022-04-07 ENCOUNTER — Encounter (HOSPITAL_COMMUNITY): Payer: Self-pay

## 2022-04-07 ENCOUNTER — Inpatient Hospital Stay (HOSPITAL_COMMUNITY)
Admission: EM | Admit: 2022-04-07 | Discharge: 2022-04-10 | DRG: 286 | Disposition: A | Discharge status: Home / Self Care | Payer: Medicare Other | Attending: Internal Medicine | Admitting: Internal Medicine

## 2022-04-07 ENCOUNTER — Other Ambulatory Visit: Payer: Self-pay

## 2022-04-07 DIAGNOSIS — E119 Type 2 diabetes mellitus without complications: Secondary | ICD-10-CM

## 2022-04-07 DIAGNOSIS — K219 Gastro-esophageal reflux disease without esophagitis: Secondary | ICD-10-CM | POA: Diagnosis present

## 2022-04-07 DIAGNOSIS — N182 Chronic kidney disease, stage 2 (mild): Secondary | ICD-10-CM | POA: Diagnosis present

## 2022-04-07 DIAGNOSIS — I25119 Atherosclerotic heart disease of native coronary artery with unspecified angina pectoris: Secondary | ICD-10-CM | POA: Diagnosis not present

## 2022-04-07 DIAGNOSIS — Z91018 Allergy to other foods: Secondary | ICD-10-CM

## 2022-04-07 DIAGNOSIS — Z888 Allergy status to other drugs, medicaments and biological substances status: Secondary | ICD-10-CM

## 2022-04-07 DIAGNOSIS — Z90710 Acquired absence of both cervix and uterus: Secondary | ICD-10-CM

## 2022-04-07 DIAGNOSIS — I255 Ischemic cardiomyopathy: Secondary | ICD-10-CM

## 2022-04-07 DIAGNOSIS — R21 Rash and other nonspecific skin eruption: Secondary | ICD-10-CM | POA: Diagnosis present

## 2022-04-07 DIAGNOSIS — G894 Chronic pain syndrome: Secondary | ICD-10-CM | POA: Diagnosis present

## 2022-04-07 DIAGNOSIS — F419 Anxiety disorder, unspecified: Secondary | ICD-10-CM | POA: Diagnosis present

## 2022-04-07 DIAGNOSIS — Z886 Allergy status to analgesic agent status: Secondary | ICD-10-CM

## 2022-04-07 DIAGNOSIS — T7840XA Allergy, unspecified, initial encounter: Secondary | ICD-10-CM | POA: Diagnosis present

## 2022-04-07 DIAGNOSIS — I152 Hypertension secondary to endocrine disorders: Secondary | ICD-10-CM | POA: Diagnosis present

## 2022-04-07 DIAGNOSIS — Z803 Family history of malignant neoplasm of breast: Secondary | ICD-10-CM

## 2022-04-07 DIAGNOSIS — E1151 Type 2 diabetes mellitus with diabetic peripheral angiopathy without gangrene: Secondary | ICD-10-CM | POA: Diagnosis present

## 2022-04-07 DIAGNOSIS — L509 Urticaria, unspecified: Secondary | ICD-10-CM

## 2022-04-07 DIAGNOSIS — I739 Peripheral vascular disease, unspecified: Secondary | ICD-10-CM

## 2022-04-07 DIAGNOSIS — R079 Chest pain, unspecified: Secondary | ICD-10-CM

## 2022-04-07 DIAGNOSIS — I110 Hypertensive heart disease with heart failure: Secondary | ICD-10-CM | POA: Diagnosis present

## 2022-04-07 DIAGNOSIS — I3139 Other pericardial effusion (noninflammatory): Secondary | ICD-10-CM | POA: Diagnosis present

## 2022-04-07 DIAGNOSIS — E1159 Type 2 diabetes mellitus with other circulatory complications: Secondary | ICD-10-CM | POA: Diagnosis present

## 2022-04-07 DIAGNOSIS — E785 Hyperlipidemia, unspecified: Secondary | ICD-10-CM | POA: Diagnosis present

## 2022-04-07 DIAGNOSIS — I2510 Atherosclerotic heart disease of native coronary artery without angina pectoris: Secondary | ICD-10-CM

## 2022-04-07 DIAGNOSIS — I10 Essential (primary) hypertension: Secondary | ICD-10-CM | POA: Diagnosis present

## 2022-04-07 DIAGNOSIS — I208 Other forms of angina pectoris: Secondary | ICD-10-CM

## 2022-04-07 DIAGNOSIS — Z79899 Other long term (current) drug therapy: Secondary | ICD-10-CM

## 2022-04-07 DIAGNOSIS — E1169 Type 2 diabetes mellitus with other specified complication: Secondary | ICD-10-CM | POA: Diagnosis present

## 2022-04-07 DIAGNOSIS — Z7984 Long term (current) use of oral hypoglycemic drugs: Secondary | ICD-10-CM

## 2022-04-07 DIAGNOSIS — E1122 Type 2 diabetes mellitus with diabetic chronic kidney disease: Secondary | ICD-10-CM | POA: Diagnosis present

## 2022-04-07 DIAGNOSIS — M549 Dorsalgia, unspecified: Secondary | ICD-10-CM | POA: Diagnosis present

## 2022-04-07 DIAGNOSIS — I5021 Acute systolic (congestive) heart failure: Secondary | ICD-10-CM

## 2022-04-07 DIAGNOSIS — X58XXXA Exposure to other specified factors, initial encounter: Secondary | ICD-10-CM | POA: Diagnosis present

## 2022-04-07 DIAGNOSIS — F17210 Nicotine dependence, cigarettes, uncomplicated: Secondary | ICD-10-CM | POA: Diagnosis present

## 2022-04-07 LAB — CBC
HCT: 41.6 % (ref 36.0–46.0)
Hemoglobin: 13.5 g/dL (ref 12.0–15.0)
MCH: 29.5 pg (ref 26.0–34.0)
MCHC: 32.5 g/dL (ref 30.0–36.0)
MCV: 91 fL (ref 80.0–100.0)
Platelets: 315 10*3/uL (ref 150–400)
RBC: 4.57 MIL/uL (ref 3.87–5.11)
RDW: 13.5 % (ref 11.5–15.5)
WBC: 10.3 10*3/uL (ref 4.0–10.5)
nRBC: 0 % (ref 0.0–0.2)

## 2022-04-07 LAB — BASIC METABOLIC PANEL
Anion gap: 11 (ref 5–15)
BUN: 11 mg/dL (ref 6–20)
CO2: 24 mmol/L (ref 22–32)
Calcium: 9.5 mg/dL (ref 8.9–10.3)
Chloride: 101 mmol/L (ref 98–111)
Creatinine, Ser: 1.25 mg/dL — ABNORMAL HIGH (ref 0.44–1.00)
GFR, Estimated: 50 mL/min — ABNORMAL LOW (ref >60)
Glucose, Bld: 152 mg/dL — ABNORMAL HIGH (ref 70–99)
Potassium: 4 mmol/L (ref 3.5–5.1)
Sodium: 136 mmol/L (ref 135–145)

## 2022-04-07 LAB — TROPONIN I (HIGH SENSITIVITY): Troponin I (High Sensitivity): 8 ng/L (ref <18)

## 2022-04-07 MED ORDER — FAMOTIDINE IN NACL 20-0.9 MG/50ML-% IV SOLN
20.0000 mg | Freq: Once | INTRAVENOUS | Status: AC
  Administered 2022-04-07: 20 mg via INTRAVENOUS
  Filled 2022-04-07: qty 50

## 2022-04-07 MED ORDER — METHYLPREDNISOLONE SODIUM SUCC 125 MG IJ SOLR
125.0000 mg | Freq: Once | INTRAMUSCULAR | Status: AC
  Administered 2022-04-07: 125 mg via INTRAVENOUS
  Filled 2022-04-07: qty 2

## 2022-04-07 NOTE — ED Provider Notes (Addendum)
?[place] ?Provider Note ? ? ?CSN: [id number] ?Arrival date & time: 04/07/22  2136 ? ?  ? ?History ? ?Chief Complaint  ?Patient presents with  ? Chest Pain  ? ? ?Erin Jackson is a 56 y.o. female. ? ? ?Chest Pain ? ?56 year old female with medical history significant for GERD, DM 2, HTN, anxiety who presents to the emergency department with severe chest pain.  The patient states that she had an onset of hives earlier today with associated nausea.  She was concern for anaphylaxis and administered home EpiPen x2.  She also admits to taking 6 Benadryl pills but cannot remember the strength.  She endorses diffuse itching and persistent hives.  She denies any airway involvement.  Her nausea has improved.  Since administration of her EpiPens, she endorses severe chest pressure that radiates to her back with associated left leg tingling.  She feels agitated after taking the Benadryl.  She continues to endorse itching. ? ?Home Medications ?Prior to Admission medications   ?Medication Sig Start Date End Date Taking? Authorizing Provider  ?alprazolam (XANAX) 2 MG tablet Take 2 mg by mouth 2 (two) times daily as needed for sleep.    [provider]  ?atorvastatin (LIPITOR) 40 MG tablet Take 40 mg by mouth every evening.  03/10/20   [provider]  ?cloNIDine (CATAPRES) 0.2 MG tablet Take 0.4 mg by mouth 2 (two) times daily.     [provider]  ?EPINEPHRINE 0.3 mg/0.3 mL IJ SOAJ injection Inject 0.3 mLs (0.3 mg total) into the muscle once as needed (allergic reaction). 10/04/18   Fabian November, MD  ?EPINEPHrine 0.3 mg/0.3 mL IJ SOAJ injection Inject 0.3 mg into the muscle as needed for anaphylaxis. 08/03/21   Jeanell Sparrow, DO  ?losartan (COZAAR) 50 MG tablet Take 50 mg by mouth daily.    [provider]  ?metFORMIN (GLUCOPHAGE) 500 MG tablet Take by mouth 2 (two) times daily with a meal.    [provider]  ?NARCAN 4 MG/0.1ML LIQD nasal spray  kit  01/08/20   [provider]  ?omeprazole (PRILOSEC) 20 MG capsule Take 1 capsule (20 mg total) by mouth daily. 11/01/16   Sherwood Gambler, MD  ?oxyCODONE (ROXICODONE) 5 MG immediate release tablet Take 1 tablet (5 mg total) by mouth every 6 (six) hours as needed for severe pain. 03/25/22   Autumn Messing III, MD  ?tiZANidine (ZANAFLEX) 4 MG tablet Take 4 mg by mouth at bedtime.  02/12/20   [provider]  ?traZODone (DESYREL) 150 MG tablet Take 150 mg by mouth at bedtime.    [provider]  ?   ? ?Allergies    ?Citrus, Cucumber extract, Norvasc [amlodipine], Apple juice, Other, Aspirin, Ibuprofen, and Niacin and related   ? ?Review of Systems   ?Review of Systems  ?Cardiovascular:  Positive for chest pain.  ?Skin:  Positive for rash.  ?All other systems reviewed and are negative. ? ?Physical Exam ?Updated Vital Signs ?BP (!) 151/103   Pulse (!) 129   Temp 98.8 ?F (37.1 ?C) (Oral)   Resp (!) 23   SpO2 100%  ?Physical Exam ?Vitals and nursing note reviewed.  ?Constitutional:   ?   General: She is not in acute distress. ?   Appearance: She is well-developed.  ?   Comments: Mildly agitated and uncomfortable  ?HENT:  ?   Head: Normocephalic and atraumatic.  ?Eyes:  ?   Conjunctiva/sclera: Conjunctivae normal.  ?  Comments: Pupils 2 mm and minimally reactive, not dilated  ?Cardiovascular:  ?   Rate and Rhythm: Normal rate and regular rhythm.  ?   Pulses:     ?     Radial pulses are 2+ on the right side and 2+ on the left side.  ?   Heart sounds: No murmur heard. ?Pulmonary:  ?   Effort: Pulmonary effort is normal. No respiratory distress.  ?   Breath sounds: Normal breath sounds.  ?Abdominal:  ?   Palpations: Abdomen is soft.  ?   Tenderness: There is no abdominal tenderness.  ?Musculoskeletal:     ?   General: No swelling.  ?   Cervical back: Neck supple.  ?Skin: ?   General: Skin is warm and dry.  ?   Capillary Refill: Capillary refill takes less than 2 seconds.  ?Neurological:  ?   Mental  Status: She is alert and oriented to person, place, and time.  ?   GCS: GCS eye subscore is 4. GCS verbal subscore is 5. GCS motor subscore is 6.  ?   Cranial Nerves: Cranial nerves 2-12 are intact.  ?   Sensory: Sensation is intact.  ?   Motor: Motor function is intact.  ?Psychiatric:     ?   Mood and Affect: Mood is anxious.     ?   Behavior: Behavior is hyperactive.  ? ? ?ED Results / Procedures / Treatments   ?Labs ?(all labs ordered are listed, but only abnormal results are displayed) ?Labs Reviewed  ?BASIC METABOLIC PANEL  ?CBC  ?I-STAT BETA HCG BLOOD, ED ([facility], [facility], [facility] ONLY)  ?TROPONIN I (HIGH SENSITIVITY)  ? ? ?EKG ?None ? ?Radiology ?No results found. ? ?Procedures ?Procedures  ? ? ?Medications Ordered in ED ?Medications - No data to display ? ?ED Course/ Medical Decision Making/ A&P ?  ?                        ?Medical Decision Making ?Risk ?OTC drugs. ?Prescription drug management. ? ? ?56 year old female with medical history significant for GERD, DM 2, HTN, anxiety who presents to the emergency department with severe chest pain.  The patient states that she had an onset of hives earlier today with associated nausea.  She was concern for anaphylaxis and administered home EpiPen x2.  She also admits to taking 6 Benadryl pills but cannot remember the strength.  She endorses diffuse itching and persistent hives.  She denies any airway involvement.  Her nausea has improved.  Since administration of her EpiPens, she endorses severe chest pressure that radiates to her back with associated left leg tingling.  She feels agitated after taking the Benadryl.  She continues to endorse itching. ? ?On arrival, the patient was afebrile, tachycardic P132, hypertensive BP 155/118, mildly tachypneic, saturating well on room air.  Her airway was clear with no signs of oropharyngeal edema.  Diffuse urticarial rash noted along the patient's bilateral extremities and abdomen and back.  She had previously endorsed nausea.   Endorsing persistent chest discomfort.  Mild abdominal discomfort.  Exam with intact radial pulses bilaterally.  Dissection study ordered in triage due to concern for crushing chest pressure radiating to the back, will continue that study to further evaluate the patient's discomfort given her hypertension, tachycardia and discomfort.  Lower concern for ACS.  EKG performed was nonischemic.  Some suspicion for side effect of administered EpiPen resulting in substernal chest discomfort.  Additional concern for  mild Benadryl overdose as the patient likely took 150 mg of Benadryl.  We will treat for allergic reaction with additional Pepcid and Solu-Medrol, hold on any further Benadryl administration.   ? ? Initial troponin negative, BMP with a slightly elevated creatinine to 1.25 from a baseline of around 1.  Plan at time of signout to reassess the patient's symptoms, follow-up for CTA imaging, follow-up repeat troponin testing, disposition appropriately following reassessment.  Signout given to Dr. Roxanne Mins at 0277. ? ? ?Final Clinical Impression(s) / ED Diagnoses ?Final diagnoses:  ?None  ? ? ?Rx / DC Orders ?ED Discharge Orders   ? ? None  ? ?  ? ? ?  ?Regan Lemming, MD ?04/08/22 0157 ? ?  ?Regan Lemming, MD ?04/08/22 0158 ? ?

## 2022-04-07 NOTE — ED Provider Notes (Signed)
Care assumed from Dr. Karene Fry, patient with allergic reaction, chest pain following two doses of epinephrine. Also possible accidental overdose of diphenhydramine - took 1150 mg. Initial troponin is normal, second troponin pending. Also watching for improvement in tachcardia that is likely secondary to both epinephrine doses and diphenhydramine. ? ?Heart rate has improved, but is still elevated.  Patient is now showing urticarial lesions in her back with significant pruritus.  CT angiogram shows no dissection, but marked atherosclerotic disease with occlusion of the left femoral artery.  I have talked with the patient, and she has been having symptoms of claudication and has been treated for probable sciatica.  I do not see any evidence of acute arterial occlusion and plan to send her to vascular surgery for follow-up.  In the meantime, she will be given lorazepam and doxepin for itching. ? ?Patient continues to be tachycardic, expresses subjective dyspnea and sense of difficulty swallowing.  Because of persistent tachycardia in spite of IV fluids and observation for 6 hours, it is felt that she would need to be admitted for ongoing observation.  Case is discussed with Dr. Toniann Fail of [facility] hospitalists, who agrees to evaluate the patient for possible admission. ?  ?Dione Booze, MD ?04/08/22 [phone] ? ?

## 2022-04-07 NOTE — ED Provider Triage Note (Signed)
?  Emergency Medicine Provider Triage Evaluation Note ? ?MRN:  [id number]  ?Arrival date & time: 04/07/22    ?Medically screening exam initiated at 10:20 PM.   ?CC:   ?Chest Pain ?  ?HPI:  ?Erin Jackson is a 56 y.o. year-old female presents to the ED with chief complaint of chest pain.  States that she is having severe chest pain that radiates to her back and complaining of nausea and left leg tingling.  Onset today after using epipen x2 for rash.   ?History provided by History provided by patient. ?ROS:  ?-As included in HPI ?PE:  ? ?Vitals:  ? 04/07/22 2207  ?BP: (!) 155/118  ?Pulse: (!) 132  ?Resp: (!) 22  ?Temp: 98.8 ?F (37.1 ?C)  ?SpO2: 98%  ?  ?Uncomfortable appearing ?No respiratory distress ? ?MDM:  ?Based on signs and symptoms, dissection is considered due to hypertension, severe chest pain that radiates to back and left leg tingling.  Contacted charge RN to expedite getting a room.  CT dissection ordered. ? ?No current signs of anaphylaxis. ? ?I've ordered labs and dissection study in triage to expedite lab/diagnostic workup. ? ?Patient was informed that the remainder of the evaluation will be completed by another provider, this initial triage assessment does not replace that evaluation, and the importance of remaining in the ED until their evaluation is complete. ? ?  ?Montine Circle, PA-C ?04/07/22 2222 ? ?

## 2022-04-07 NOTE — ED Triage Notes (Signed)
Pt arrives to ED POV c/o allergic reaction. Pt states that she broke out in painful hives and self administered two epi pens, one at 12pm and another at 6pm. Pt is now having "crushing chest pain" and is tachycardic. ?

## 2022-04-08 ENCOUNTER — Observation Stay (HOSPITAL_COMMUNITY): Payer: Medicare Other

## 2022-04-08 ENCOUNTER — Encounter (HOSPITAL_COMMUNITY): Payer: Self-pay | Admitting: Internal Medicine

## 2022-04-08 ENCOUNTER — Emergency Department (HOSPITAL_COMMUNITY): Payer: Medicare Other

## 2022-04-08 DIAGNOSIS — Z79899 Other long term (current) drug therapy: Secondary | ICD-10-CM | POA: Diagnosis not present

## 2022-04-08 DIAGNOSIS — I110 Hypertensive heart disease with heart failure: Secondary | ICD-10-CM | POA: Diagnosis present

## 2022-04-08 DIAGNOSIS — I25119 Atherosclerotic heart disease of native coronary artery with unspecified angina pectoris: Secondary | ICD-10-CM | POA: Diagnosis present

## 2022-04-08 DIAGNOSIS — I208 Other forms of angina pectoris: Secondary | ICD-10-CM

## 2022-04-08 DIAGNOSIS — I7092 Chronic total occlusion of artery of the extremities: Secondary | ICD-10-CM | POA: Diagnosis not present

## 2022-04-08 DIAGNOSIS — M549 Dorsalgia, unspecified: Secondary | ICD-10-CM | POA: Diagnosis present

## 2022-04-08 DIAGNOSIS — K219 Gastro-esophageal reflux disease without esophagitis: Secondary | ICD-10-CM | POA: Diagnosis present

## 2022-04-08 DIAGNOSIS — I5021 Acute systolic (congestive) heart failure: Secondary | ICD-10-CM | POA: Diagnosis present

## 2022-04-08 DIAGNOSIS — I3139 Other pericardial effusion (noninflammatory): Secondary | ICD-10-CM | POA: Diagnosis present

## 2022-04-08 DIAGNOSIS — Z803 Family history of malignant neoplasm of breast: Secondary | ICD-10-CM | POA: Diagnosis not present

## 2022-04-08 DIAGNOSIS — N182 Chronic kidney disease, stage 2 (mild): Secondary | ICD-10-CM | POA: Diagnosis present

## 2022-04-08 DIAGNOSIS — F17210 Nicotine dependence, cigarettes, uncomplicated: Secondary | ICD-10-CM | POA: Diagnosis present

## 2022-04-08 DIAGNOSIS — R21 Rash and other nonspecific skin eruption: Secondary | ICD-10-CM | POA: Diagnosis present

## 2022-04-08 DIAGNOSIS — T7840XA Allergy, unspecified, initial encounter: Secondary | ICD-10-CM | POA: Diagnosis present

## 2022-04-08 DIAGNOSIS — Z888 Allergy status to other drugs, medicaments and biological substances status: Secondary | ICD-10-CM | POA: Diagnosis not present

## 2022-04-08 DIAGNOSIS — E1122 Type 2 diabetes mellitus with diabetic chronic kidney disease: Secondary | ICD-10-CM | POA: Diagnosis present

## 2022-04-08 DIAGNOSIS — R7989 Other specified abnormal findings of blood chemistry: Secondary | ICD-10-CM | POA: Diagnosis not present

## 2022-04-08 DIAGNOSIS — R079 Chest pain, unspecified: Secondary | ICD-10-CM

## 2022-04-08 DIAGNOSIS — I200 Unstable angina: Secondary | ICD-10-CM | POA: Diagnosis not present

## 2022-04-08 DIAGNOSIS — X58XXXA Exposure to other specified factors, initial encounter: Secondary | ICD-10-CM | POA: Diagnosis present

## 2022-04-08 DIAGNOSIS — G894 Chronic pain syndrome: Secondary | ICD-10-CM | POA: Diagnosis present

## 2022-04-08 DIAGNOSIS — E119 Type 2 diabetes mellitus without complications: Secondary | ICD-10-CM | POA: Diagnosis not present

## 2022-04-08 DIAGNOSIS — Z886 Allergy status to analgesic agent status: Secondary | ICD-10-CM | POA: Diagnosis not present

## 2022-04-08 DIAGNOSIS — I2510 Atherosclerotic heart disease of native coronary artery without angina pectoris: Secondary | ICD-10-CM | POA: Diagnosis not present

## 2022-04-08 DIAGNOSIS — E785 Hyperlipidemia, unspecified: Secondary | ICD-10-CM | POA: Diagnosis present

## 2022-04-08 DIAGNOSIS — Z7984 Long term (current) use of oral hypoglycemic drugs: Secondary | ICD-10-CM | POA: Diagnosis not present

## 2022-04-08 DIAGNOSIS — E1151 Type 2 diabetes mellitus with diabetic peripheral angiopathy without gangrene: Secondary | ICD-10-CM | POA: Diagnosis present

## 2022-04-08 DIAGNOSIS — F419 Anxiety disorder, unspecified: Secondary | ICD-10-CM | POA: Diagnosis present

## 2022-04-08 DIAGNOSIS — E1169 Type 2 diabetes mellitus with other specified complication: Secondary | ICD-10-CM | POA: Diagnosis present

## 2022-04-08 DIAGNOSIS — Z90710 Acquired absence of both cervix and uterus: Secondary | ICD-10-CM | POA: Diagnosis not present

## 2022-04-08 DIAGNOSIS — Z91018 Allergy to other foods: Secondary | ICD-10-CM | POA: Diagnosis not present

## 2022-04-08 LAB — CBC WITH DIFFERENTIAL/PLATELET
Abs Immature Granulocytes: 0.03 10*3/uL (ref 0.00–0.07)
Basophils Absolute: 0 10*3/uL (ref 0.0–0.1)
Basophils Relative: 0 %
Eosinophils Absolute: 0 10*3/uL (ref 0.0–0.5)
Eosinophils Relative: 0 %
HCT: 41 % (ref 36.0–46.0)
Hemoglobin: 13.2 g/dL (ref 12.0–15.0)
Immature Granulocytes: 0 %
Lymphocytes Relative: 21 %
Lymphs Abs: 1.6 10*3/uL (ref 0.7–4.0)
MCH: 29.3 pg (ref 26.0–34.0)
MCHC: 32.2 g/dL (ref 30.0–36.0)
MCV: 90.9 fL (ref 80.0–100.0)
Monocytes Absolute: 0.1 10*3/uL (ref 0.1–1.0)
Monocytes Relative: 1 %
Neutro Abs: 6 10*3/uL (ref 1.7–7.7)
Neutrophils Relative %: 78 %
Platelets: 278 10*3/uL (ref 150–400)
RBC: 4.51 MIL/uL (ref 3.87–5.11)
RDW: 13.4 % (ref 11.5–15.5)
WBC: 7.7 10*3/uL (ref 4.0–10.5)
nRBC: 0 % (ref 0.0–0.2)

## 2022-04-08 LAB — COMPREHENSIVE METABOLIC PANEL
ALT: 10 U/L (ref 0–44)
AST: 16 U/L (ref 15–41)
Albumin: 3.1 g/dL — ABNORMAL LOW (ref 3.5–5.0)
Alkaline Phosphatase: 65 U/L (ref 38–126)
Anion gap: 12 (ref 5–15)
BUN: 14 mg/dL (ref 6–20)
CO2: 20 mmol/L — ABNORMAL LOW (ref 22–32)
Calcium: 9.2 mg/dL (ref 8.9–10.3)
Chloride: 99 mmol/L (ref 98–111)
Creatinine, Ser: 1.18 mg/dL — ABNORMAL HIGH (ref 0.44–1.00)
GFR, Estimated: 54 mL/min — ABNORMAL LOW (ref >60)
Glucose, Bld: 236 mg/dL — ABNORMAL HIGH (ref 70–99)
Potassium: 4.6 mmol/L (ref 3.5–5.1)
Sodium: 131 mmol/L — ABNORMAL LOW (ref 135–145)
Total Bilirubin: 0.6 mg/dL (ref 0.3–1.2)
Total Protein: 7.3 g/dL (ref 6.5–8.1)

## 2022-04-08 LAB — ECHOCARDIOGRAM COMPLETE
AV Vena cont: 0.2 cm
Area-P 1/2: 5.13 cm2
Calc EF: 43.2 %
P 1/2 time: 481 msec
S' Lateral: 3.9 cm
Single Plane A2C EF: 40.3 %
Single Plane A4C EF: 47.6 %

## 2022-04-08 LAB — CBG MONITORING, ED
Glucose-Capillary: 251 mg/dL — ABNORMAL HIGH (ref 70–99)
Glucose-Capillary: 199 mg/dL — ABNORMAL HIGH (ref 70–99)

## 2022-04-08 LAB — TROPONIN I (HIGH SENSITIVITY)
Troponin I (High Sensitivity): 11 ng/L (ref <18)
Troponin I (High Sensitivity): 9 ng/L (ref <18)
Troponin I (High Sensitivity): 8 ng/L (ref <18)

## 2022-04-08 LAB — GLUCOSE, CAPILLARY
Glucose-Capillary: 164 mg/dL — ABNORMAL HIGH (ref 70–99)
Glucose-Capillary: 267 mg/dL — ABNORMAL HIGH (ref 70–99)

## 2022-04-08 LAB — TSH: TSH: 1.328 u[IU]/mL (ref 0.350–4.500)

## 2022-04-08 LAB — HIV ANTIBODY (ROUTINE TESTING W REFLEX): HIV Screen 4th Generation wRfx: NONREACTIVE

## 2022-04-08 MED ORDER — CLONIDINE HCL 0.2 MG PO TABS
0.4000 mg | ORAL_TABLET | Freq: Two times a day (BID) | ORAL | Status: DC
Start: 2022-04-08 — End: 2022-04-10
  Administered 2022-04-08 – 2022-04-09 (×4): 0.4 mg via ORAL
  Filled 2022-04-08 (×4): qty 2

## 2022-04-08 MED ORDER — HEPARIN SODIUM (PORCINE) 5000 UNIT/ML IJ SOLN
5000.0000 [IU] | Freq: Three times a day (TID) | INTRAMUSCULAR | Status: DC
  Administered 2022-04-08 – 2022-04-09 (×3): 5000 [IU] via SUBCUTANEOUS
  Filled 2022-04-08 (×3): qty 1

## 2022-04-08 MED ORDER — PANTOPRAZOLE SODIUM 40 MG PO TBEC
40.0000 mg | DELAYED_RELEASE_TABLET | Freq: Every day | ORAL | Status: DC
  Administered 2022-04-08 – 2022-04-10 (×3): 40 mg via ORAL
  Filled 2022-04-08 (×3): qty 1

## 2022-04-08 MED ORDER — DIPHENHYDRAMINE HCL 25 MG PO CAPS
25.0000 mg | ORAL_CAPSULE | Freq: Four times a day (QID) | ORAL | Status: DC | PRN
  Administered 2022-04-08 – 2022-04-09 (×3): 25 mg via ORAL
  Filled 2022-04-08 (×3): qty 1

## 2022-04-08 MED ORDER — SODIUM CHLORIDE 0.9 % IV SOLN
INTRAVENOUS | Status: AC
Start: 2022-04-08 — End: 2022-04-08

## 2022-04-08 MED ORDER — METFORMIN HCL 500 MG PO TABS
500.0000 mg | ORAL_TABLET | Freq: Once | ORAL | Status: AC
  Administered 2022-04-08: 500 mg via ORAL
  Filled 2022-04-08: qty 1

## 2022-04-08 MED ORDER — OXYCODONE HCL 5 MG PO TABS
5.0000 mg | ORAL_TABLET | Freq: Once | ORAL | Status: AC
  Administered 2022-04-08: 5 mg via ORAL
  Filled 2022-04-08: qty 1

## 2022-04-08 MED ORDER — TRAZODONE HCL 50 MG PO TABS
150.0000 mg | ORAL_TABLET | Freq: Every day | ORAL | Status: DC
  Administered 2022-04-08: 150 mg via ORAL
  Filled 2022-04-08: qty 3

## 2022-04-08 MED ORDER — TIZANIDINE HCL 4 MG PO TABS
4.0000 mg | ORAL_TABLET | Freq: Once | ORAL | Status: AC
  Administered 2022-04-08: 4 mg via ORAL
  Filled 2022-04-08: qty 1

## 2022-04-08 MED ORDER — ACETAMINOPHEN 325 MG PO TABS
650.0000 mg | ORAL_TABLET | Freq: Four times a day (QID) | ORAL | Status: DC | PRN
  Administered 2022-04-08: 650 mg via ORAL
  Filled 2022-04-08: qty 2

## 2022-04-08 MED ORDER — LORAZEPAM 1 MG PO TABS
1.0000 mg | ORAL_TABLET | Freq: Once | ORAL | Status: AC
  Administered 2022-04-08: 1 mg via ORAL
  Filled 2022-04-08: qty 1

## 2022-04-08 MED ORDER — INSULIN ASPART 100 UNIT/ML IJ SOLN
0.0000 [IU] | Freq: Three times a day (TID) | INTRAMUSCULAR | Status: DC
  Administered 2022-04-08: 2 [IU] via SUBCUTANEOUS
  Administered 2022-04-08: 5 [IU] via SUBCUTANEOUS
  Administered 2022-04-08 – 2022-04-09 (×2): 2 [IU] via SUBCUTANEOUS
  Administered 2022-04-10: 5 [IU] via SUBCUTANEOUS
  Administered 2022-04-10: 2 [IU] via SUBCUTANEOUS

## 2022-04-08 MED ORDER — DOXEPIN HCL 10 MG PO CAPS
10.0000 mg | ORAL_CAPSULE | Freq: Once | ORAL | Status: AC
Start: 2022-04-08 — End: 2022-04-08
  Administered 2022-04-08: 10 mg via ORAL
  Filled 2022-04-08: qty 1

## 2022-04-08 MED ORDER — ALPRAZOLAM 0.5 MG PO TABS
2.0000 mg | ORAL_TABLET | Freq: Two times a day (BID) | ORAL | Status: DC | PRN
  Administered 2022-04-08 – 2022-04-09 (×2): 2 mg via ORAL
  Filled 2022-04-08: qty 8
  Filled 2022-04-08: qty 4

## 2022-04-08 MED ORDER — ACETAMINOPHEN 650 MG RE SUPP: 650.0000 mg | Freq: Four times a day (QID) | RECTAL | Status: DC | PRN

## 2022-04-08 MED ORDER — ACETAMINOPHEN 325 MG PO TABS
650.0000 mg | ORAL_TABLET | Freq: Once | ORAL | Status: AC
  Administered 2022-04-08: 650 mg via ORAL
  Filled 2022-04-08: qty 2

## 2022-04-08 MED ORDER — LORAZEPAM 1 MG PO TABS
1.0000 mg | ORAL_TABLET | Freq: Once | ORAL | Status: AC
Start: 2022-04-08 — End: 2022-04-08
  Administered 2022-04-08: 1 mg via ORAL
  Filled 2022-04-08: qty 1

## 2022-04-08 MED ORDER — IOHEXOL 350 MG/ML SOLN
100.0000 mL | Freq: Once | INTRAVENOUS | Status: AC | PRN
  Administered 2022-04-08: 100 mL via INTRAVENOUS

## 2022-04-08 MED ORDER — FAMOTIDINE IN NACL 20-0.9 MG/50ML-% IV SOLN
20.0000 mg | Freq: Two times a day (BID) | INTRAVENOUS | Status: DC
  Administered 2022-04-08 – 2022-04-10 (×5): 20 mg via INTRAVENOUS
  Filled 2022-04-08 (×7): qty 50

## 2022-04-08 MED ORDER — ATORVASTATIN CALCIUM 40 MG PO TABS
40.0000 mg | ORAL_TABLET | Freq: Every day | ORAL | Status: DC
  Administered 2022-04-08 – 2022-04-09 (×2): 40 mg via ORAL
  Filled 2022-04-08 (×2): qty 1

## 2022-04-08 MED ORDER — METHYLPREDNISOLONE SODIUM SUCC 125 MG IJ SOLR
80.0000 mg | INTRAMUSCULAR | Status: DC
  Administered 2022-04-08: 80 mg via INTRAVENOUS
  Filled 2022-04-08: qty 2

## 2022-04-08 MED ORDER — OXYCODONE HCL ER 15 MG PO T12A
15.0000 mg | EXTENDED_RELEASE_TABLET | Freq: Two times a day (BID) | ORAL | Status: DC | PRN
  Administered 2022-04-09: 15 mg via ORAL
  Filled 2022-04-08: qty 1

## 2022-04-08 MED ORDER — DIPHENHYDRAMINE HCL 50 MG/ML IJ SOLN
25.0000 mg | Freq: Four times a day (QID) | INTRAMUSCULAR | Status: DC | PRN
  Administered 2022-04-08 (×2): 25 mg via INTRAMUSCULAR
  Filled 2022-04-08 (×2): qty 1

## 2022-04-08 NOTE — Consult Note (Addendum)
Hospital Consult    Reason for Consult:  left femoral artery occlusion Requesting Physician:  Hal Hope MRN #:  [id number]  History of Present Illness: This is a 56 y.o. female who presented to the hospital with allergic reaction.  She had CTA and was found to have a left femoral artery occlusion and VVS is consulted.   Difficult to arouse pt due to sedating medications give.  She is not alert enough to answer questions.  Per H&P, she has been having pain in the left leg with walking.  She is unable to answer any questions except she currently smokes.  Additional information obtained from the chart.   The pt is on a statin for cholesterol management.  The pt is not on a daily aspirin.   Other AC:  none The pt is on clonididin for hypertension.   The pt is diabetic.   Tobacco hx:  current  Past Medical History:  Diagnosis Date   Anxiety    Chronic back pain    Diabetes mellitus without complication ([place])    GERD (gastroesophageal reflux disease)    Hypertension     Past Surgical History:  Procedure Laterality Date   ABDOMINAL HYSTERECTOMY     BREAST LUMPECTOMY Left 03/25/2022   Procedure: LEFT BREAST LUMPECTOMY;  Surgeon: Jovita Kussmaul, MD;  Location: [facility];  Service: General;  Laterality: Left;   BREAST REDUCTION SURGERY Bilateral 08/15/2018   Procedure: BILATERAL MAMMARY REDUCTION  (BREAST);  Surgeon: Cristine Polio, MD;  Location: [facility];  Service: Plastics;  Laterality: Bilateral;   BREAST REDUCTION SURGERY Left 09/25/2019   Procedure: EXCISION OF FAT NECROSIS OF LEFT BREAST;  Surgeon: Cristine Polio, MD;  Location: [facility];  Service: Plastics;  Laterality: Left;   CARPAL TUNNEL RELEASE Right 06/17/2020   right thumb CMC arthroplasty with tendon transfer and right carpal tunnel release.    CERVICAL SPINE SURGERY  2015   has a metal plate   FOOT SURGERY Left 2014   HAND SURGERY     REDUCTION MAMMAPLASTY  Bilateral 07/2018    Allergies  Allergen Reactions   Citrus Shortness Of Breath   Cucumber Extract Anaphylaxis   Norvasc [Amlodipine] Other (See Comments)    Muscle became very tight   Apple Juice    Other Other (See Comments)    Tree nuts, walnuts   Aspirin Rash   Ibuprofen Rash   Niacin And Related Rash    Prior to Admission medications   Medication Sig Start Date End Date Taking? Authorizing Provider  alprazolam Duanne Moron) 2 MG tablet Take 2 mg by mouth 2 (two) times daily as needed for anxiety.   Yes [provider]  cloNIDine (CATAPRES) 0.2 MG tablet Take 0.4 mg by mouth 2 (two) times daily.    Yes [provider]  EPINEPHRINE 0.3 mg/0.3 mL IJ SOAJ injection Inject 0.3 mLs (0.3 mg total) into the muscle once as needed (allergic reaction). Patient taking differently: Inject 0.3 mg into the muscle as needed for anaphylaxis (allergic reaction). 10/04/18  Yes Fabian November, MD  HYDROcodone-acetaminophen (NORCO) 10-325 MG tablet Take 1 tablet by mouth in the morning and at bedtime. 03/20/22  Yes [provider]  metFORMIN (GLUCOPHAGE) 500 MG tablet Take 500 mg by mouth 2 (two) times daily with a meal.   Yes [provider]  NARCAN 4 MG/0.1ML LIQD nasal spray kit Place 0.4 mg into the nose as needed (opoid overdose). 01/08/20  Yes [provider]  traZODone (DESYREL) 150 MG tablet Take 150 mg by mouth at bedtime.   Yes [provider]  XTAMPZA ER 13.5 MG C12A Take 1 capsule by mouth 2 (two) times daily as needed (pain). 03/20/22  Yes [provider]  oxyCODONE (ROXICODONE) 5 MG immediate release tablet Take 1 tablet (5 mg total) by mouth every 6 (six) hours as needed for severe pain. Patient not taking: Reported on 04/08/2022 03/25/22   Jovita Kussmaul, MD    Social History   Socioeconomic History   Marital status: Divorced    Spouse name: Not on file   Number of children: Not on file   Years of education: Not on file   Highest  education level: Not on file  Occupational History   Occupation: disabled  Tobacco Use   Smoking status: Every Day    Packs/day: 0.50    Types: Cigarettes   Smokeless tobacco: Never  Vaping Use   Vaping Use: Never used  Substance and Sexual Activity   Alcohol use: Yes    Comment: socially   Drug use: Never    Types: Marijuana    Comment: occasional   Sexual activity: Never  Other Topics Concern   Not on file  Social History Narrative   Not on file   Social Determinants of Health   Financial Resource Strain: Not on file  Food Insecurity: Not on file  Transportation Needs: Not on file  Physical Activity: Not on file  Stress: Not on file  Social Connections: Not on file  Intimate Partner Violence: Not on file     Family History  Problem Relation Age of Onset   Breast cancer Maternal Aunt    Breast cancer Maternal Grandmother     ROS: _0  Positive   _1  Negative   _2  All sytems reviewed and are negative Unable to obtain from pt Cardiac: _3  chest pain/pressure _4  palpitations _5  SOB lying flat _6  DOE  Vascular: _7  pain in legs while walking _8  pain in legs at rest _9  pain in legs at night _10  non-healing ulcers _11  hx of DVT _12  swelling in legs  Pulmonary: _13  asthma/wheezing _14  home O2  Neurologic: _15  hx of CVA _16  mini stroke   Hematologic: _17  hx of cancer  Endocrine:   _18  diabetes _19  thyroid disease  GI _20  GERD  GU: _21  CKD/renal failure _22  HD--_23  M/W/F or _24  T/T/S  Psychiatric: _25  anxiety _26  depression  Musculoskeletal: _27  arthritis _28  joint pain  Integumentary: _29  rashes _30  ulcers  Constitutional: _31  fever  _32  chills  Physical Examination  Vitals:   04/08/22 0805 04/08/22 0810  BP:    Pulse: (!) 125 (!) 121  Resp: (!) 24 20  Temp:    SpO2: 97% 96%   There is no height or weight on file to calculate BMI.  General:  WDWN in NAD; able to awake but quickly falls back asleep and unable to answer questions.  Gait: Not  observed HENT: WNL, normocephalic Pulmonary: normal non-labored breathing Cardiac: tachy but regular Abdomen:  soft, NT/ND; aortic pulse is not palpable Skin: without rashes Vascular Exam/Pulses: Bilateral radial pulses are palpable Bilateral femoral pulses are palpable Pedal pulses are not palpable Extremities: without ischemic changes, without Gangrene , without cellulitis; without open wounds Musculoskeletal: no muscle wasting or atrophy  Neurologic: A&O X 3; speech is fluent/normal Psychiatric:  unable to determine affect   CBC    Component Value Date/Time   WBC 7.7 04/08/2022 0623   RBC 4.51  04/08/2022 0623   HGB 13.2 04/08/2022 0623   HCT 41.0 04/08/2022 0623   PLT 278 04/08/2022 0623   MCV 90.9 04/08/2022 0623   MCH 29.3 04/08/2022 0623   MCHC 32.2 04/08/2022 0623   RDW 13.4 04/08/2022 0623   LYMPHSABS 1.6 04/08/2022 0623   MONOABS 0.1 04/08/2022 0623   EOSABS 0.0 04/08/2022 0623   BASOSABS 0.0 04/08/2022 0623    BMET    Component Value Date/Time   NA 131 (L) 04/08/2022 0623   K 4.6 04/08/2022 0623   CL 99 04/08/2022 0623   CO2 20 (L) 04/08/2022 0623   GLUCOSE 236 (H) 04/08/2022 0623   BUN 14 04/08/2022 0623   CREATININE 1.18 (H) 04/08/2022 0623   CALCIUM 9.2 04/08/2022 0623   GFRNONAA 54 (L) 04/08/2022 0623   GFRAA >60 06/18/2020 1139    COAGS: No results found for: INR, PROTIME   Non-Invasive Vascular Imaging:   CTA c/a/p 04/09/2022 IMPRESSION: 1. No evidence for aortic dissection or aneurysm. 2. Severe atherosclerotic disease of the abdominal aorta with noncalcified plaque and ulcerated plaque. 3. 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 4. Severe focal stenosis origin of the right renal artery. 5. IMA not visualized. 6. Patchy hypodensity superior pole the right kidney, indeterminate. Findings may represent pyelonephritis or infarct. Other focal lesion can not be excluded. Consider follow-up evaluation with dedicated renal CT  or MRI. 7. Small pericardial effusion. 8. Low-density collection in the left breast, indeterminate. Findings may be related to fluid collection or resolving hematoma. Recommend clinical correlation. Consider ultrasound.   ASSESSMENT/PLAN: This is a 56 y.o. female who presented to the hospital for allergic reaction and CTA revealed occlusion of left femoral artery.    -pt does have bilateral femoral pulses on exam.  Her pedal pulses are not palpable.  Unable to obtain any hx from the pt due to sedating medication.  Will order ABI.  Dr. Stanford Breed on call MD will be by to see pt this afternoon.  Hopefully she will be more alert at that time.     Leontine Locket, PA-C [facility] [phone]  VASCULAR STAFF ADDENDUM: I have independently interviewed and examined the patient. I agree with the above.   56 y.o. female with atherosclerosis of native arteries of left lower extremity causing intermittent claudication.  Recommend the following which can slow the progression of atherosclerosis and reduce the risk of major adverse cardiac / limb events:  Complete cessation from all tobacco products. Blood glucose control with goal A1c < 7%. Blood pressure control with goal blood pressure < 140/90 mmHg. Lipid reduction therapy with goal LDL-C <100 mg/dL (<70 if symptomatic from PAD).  Aspirin 76m PO QD.  Atorvastatin 40-80mg PO QD (or other "high intensity" statin therapy).  No evidence of limb threatening ischemia. No role for revascularization at the moment.   Will discuss further with her in the morning to better characterize her symptoms.  Likely outpatient follow up to monitor PAD.  TYevonne Aline HStanford Breed MD [facility]Phone Number: [phone]/19/2023 4:34 PM

## 2022-04-08 NOTE — ED Notes (Signed)
Remains in Vasc lab ?

## 2022-04-08 NOTE — Progress Notes (Signed)
ABI has been completed.  ? ?Preliminary results in CV Proc.  ? ?Erin Jackson ?04/08/2022 1:13 PM    ?

## 2022-04-08 NOTE — ED Notes (Signed)
C/o heartburn after eating lunch, admitting notified/ messaged.  ?

## 2022-04-08 NOTE — ED Notes (Signed)
Pt to vascular lab

## 2022-04-08 NOTE — ED Notes (Signed)
Pt sleeping after xanax and benadryl, sonorous resps, arousable to physical stimuli, interactive, appropriate. Vascular PA at [facility].  ?

## 2022-04-08 NOTE — Progress Notes (Signed)
?  Echocardiogram ?2D Echocardiogram has been performed. ? ?Erin Jackson ?04/08/2022, 1:43 PM ?

## 2022-04-08 NOTE — Progress Notes (Signed)
Inpatient Diabetes Program Recommendations ? ?AACE/ADA: New Consensus Statement on Inpatient Glycemic Control (2015) ? ?Target Ranges:  Prepandial:   less than 140 mg/dL ?     Peak postprandial:   less than 180 mg/dL (1-2 hours) ?     Critically ill patients:  140 - 180 mg/dL  ? ?Lab Results  ?Component Value Date  ? GLUCAP 251 (H) 04/08/2022  ? HGBA1C 8.0 (H) 06/18/2020  ? ? ?Review of Glycemic Control ? Latest Reference Range & Units 04/08/22 07:37  ?Glucose-Capillary 70 - 99 mg/dL 277 (H)  ? ?Diabetes history: DM 2 ?Outpatient Diabetes medications: Metformin 500 mg bid ?Current orders for Inpatient glycemic control:  ?Novolog 0-9 units tid ? ?125 mg Solumedrol x 1 dose no ordered Solumedrol 80 mg Q24 hours ? ?Inpatient Diabetes Program Recommendations:   ? ?Note first dose of Novolog Correction given this am. ? ?-  Add Novolog hs scale ?-  Add Novolog 3 units tid meal coverage while on steroids ? ?Thanks, ? ?Christena Deem RN, MSN, BC-ADM ?Inpatient Diabetes Coordinator ?Team Pager [phone] (8a-5p) ? ? ? ?

## 2022-04-08 NOTE — ED Notes (Signed)
Pt removed self from monitor. Returned to monitor. Alert, NAD, calm, interactive, resps e/u, speaking in clear, complete sentences. VSS. Denies pain, sob, nausea, itching. Currently "feel better".  ?

## 2022-04-08 NOTE — Progress Notes (Signed)
PROGRESS NOTE    Erin Jackson  ZYY:[id number] DOB: Sep 21, 1966 DOA: 04/07/2022 PCP: Raymon Mutton., FNP    Chief Complaint  Patient presents with   Chest Pain    Brief Narrative:    This is a no charge note as patient was seen and admitted earlier today by Dr. Toniann Fail, patient was seen and examined, chart, imaging and labs were reviewed.    HPI: Erin Jackson is a 56 y.o. female with history of hypertension, diabetes mellitus, anxiety, hyperlipidemia presents to the ER after patient has been having persistent itching over the last 2 days for which patient took 2 doses of epinephrine shots and also 6 tablets of Benadryl.  Patient took Benadryl yesterday morning around 8 AM.  Patient states it was over-the-counter Benadryl tablets.  In the afternoon she took 1 shot of epi pain.  And 6 hours later she took 1 more short.  Since then she also started having some chest pressure.  Over the last few months patient also has been having pain in the left lower extremity on trying to walk.     Patient states she has not taken any new medication and has not had any new clothes or any new detergents or any perfumes.     ED Course: In the ER patient was tachycardic with EKG showing sinus tachycardia and QTc of 468 ms.  Patient was given Solu-Medrol Pepcid for the itching along with doxepin.  CT angiogram of the chest was done which was negative for PE but did show some pericardial effusion also left femoral artery occlusion also have findings on the right kidney concerning for possible infarct versus pyelonephritis.  Patient admitted for further work-up.  Cardiac markers were negative   Assessment & Plan:   Principal Problem:   Allergic reaction Active Problems:   Hypertension associated with diabetes (HCC)   T2DM (type 2 diabetes mellitus) (HCC)   Chest pain   Allergic reaction cause not clear.  Patient states she at times gets frequent allergies last 1 was in December when patient was  given EpiPen.  She was started on IV Solu-Medrol and Pepcid, she remains with significant itching so I will continue with current dose of IV Solu-Medrol, and will continue with IV Pepcid as well.  We will continue to monitor we will keep patient on IV Solu-Medrol and Pepcid.  Since patient took a large amount of Benadryl we will avoid that. Sinus tachycardia likely from using EpiPen could also be from using Benadryl.  We will closely monitor patient's rhythm in telemetry and also check QTc's every 6 hours.  She remains tachycardic so I will continue with IV fluids. Chest pain likely will from tachycardia.  CT scan did show some pericardial effusion.  Will check 2D echo. Left femoral artery occlusion, vascular surgery consulted Possible infarct versus pyonephritis of the right kidney.  Negative urine analysis Hypertension on clonidine. Diabetes mellitus type 2 we will keep patient on sliding scale coverage. Tobacco abuse: She was counseled   DVT prophylaxis: Heparin Code Status: Full Family Communication: None at bedside Disposition:   Status is: Inpatient    Consultants:  Vascular surgery   Subjective:  Still complains of significant itching and tingling  Objective: Vitals:   04/08/22 1200 04/08/22 1230 04/08/22 1351 04/08/22 1400  BP:   (!) 156/102 (!) 139/100  Pulse: (!) 112 (!) 106 (!) 101 95  Resp: (!) 25 (!) 23 (!) 27 (!) 27  Temp:  TempSrc:      SpO2: 90% 92% 94% 92%    Intake/Output Summary (Last 24 hours) at 04/08/2022 1619 Last data filed at 04/08/2022 1048 Gross per 24 hour  Intake 100 ml  Output --  Net 100 ml   There were no vitals filed for this visit.  Examination:  Awake Alert, Oriented X 3, No new F.N deficits, Normal affect Symmetrical Chest wall movement, Good air movement bilaterally, CTAB Cardiac,No Gallops,Rubs or new Murmurs, No Parasternal Heave +ve B.Sounds, Abd Soft, No tenderness, No rebound - guarding or rigidity. No Cyanosis, Clubbing  or edema, No new Rash or bruise       Data Reviewed: I have personally reviewed following labs and imaging studies  CBC: Recent Labs  Lab 04/07/22 2229 04/08/22 0623  WBC 10.3 7.7  NEUTROABS  --  6.0  HGB 13.5 13.2  HCT 41.6 41.0  MCV 91.0 90.9  PLT 315 278    Basic Metabolic Panel: Recent Labs  Lab 04/07/22 2229 04/08/22 0623  NA 136 131*  K 4.0 4.6  CL 101 99  CO2 24 20*  GLUCOSE 152* 236*  BUN 11 14  CREATININE 1.25* 1.18*  CALCIUM 9.5 9.2    GFR: CrCl cannot be calculated (Unknown ideal weight.).  Liver Function Tests: Recent Labs  Lab 04/08/22 0623  AST 16  ALT 10  ALKPHOS 65  BILITOT 0.6  PROT 7.3  ALBUMIN 3.1*    CBG: Recent Labs  Lab 04/08/22 0737 04/08/22 1216  GLUCAP 251* 199*     No results found for this or any previous visit (from the past 240 hour(s)).       Radiology Studies: VAS [place] ABI WITH/WO TBI  Result Date: 04/08/2022  LOWER EXTREMITY DOPPLER STUDY Patient Name:  Erin Jackson  Date of Exam:   04/08/2022 Medical Rec #: [id number]       Accession #:    [id number] Date of Birth: 1966/05/17       Patient Gender: F Patient Age:   1 years Exam Location:  [facility] Procedure:      VAS [place] ABI WITH/WO TBI Referring Phys: Lelon Mast RHYNE --------------------------------------------------------------------------------  Indications: Femoral artery occlusion, left. High Risk Factors: Hypertension, Diabetes.  Comparison Study: no prior Performing Technologist: [place] Ponder RVS  Examination Guidelines: A complete evaluation includes at minimum, Doppler waveform signals and systolic blood pressure reading at the level of bilateral brachial, anterior tibial, and posterior tibial arteries, when vessel segments are accessible. Bilateral testing is considered an integral part of a complete examination. Photoelectric Plethysmograph (PPG) waveforms and toe systolic pressure readings are included as required and additional duplex testing as  needed. Limited examinations for reoccurring indications may be performed as noted.  ABI Findings: +---------+------------------+-----+---------+--------+ Right    Rt Pressure (mmHg)IndexWaveform Comment  +---------+------------------+-----+---------+--------+ Brachial 155                    triphasic         +---------+------------------+-----+---------+--------+ PTA      121               0.74 biphasic          +---------+------------------+-----+---------+--------+ DP       151               0.93 triphasic         +---------+------------------+-----+---------+--------+ Morton Amy               0.69 Abnormal          +---------+------------------+-----+---------+--------+ +---------+------------------+-----+-------------------+-----------------------+  Left     Lt Pressure (mmHg)IndexWaveform           Comment                 +---------+------------------+-----+-------------------+-----------------------+ Brachial 163                    triphasic                                  +---------+------------------+-----+-------------------+-----------------------+ PTA      66                0.40 dampened monophasic                        +---------+------------------+-----+-------------------+-----------------------+ DP       64                0.39 dampened monophasic                        +---------+------------------+-----+-------------------+-----------------------+ Great Toe                                          unable to obtain                                                           pressure due to                                                            decreased great toe                                                        amplitude               +---------+------------------+-----+-------------------+-----------------------+ +-------+-----------+-----------+------------+------------+ ABI/TBIToday's ABIToday's  TBIPrevious ABIPrevious TBI +-------+-----------+-----------+------------+------------+ Right  0.93       0.69                                +-------+-----------+-----------+------------+------------+ Left   0.40                                           +-------+-----------+-----------+------------+------------+   Summary: Right: Resting right ankle-brachial index indicates mild right lower extremity arterial disease. The right toe-brachial index is abnormal. Left: Resting left ankle-brachial index indicates severe left lower extremity arterial disease. *See table(s) above for measurements and observations.     Preliminary    ECHOCARDIOGRAM COMPLETE  Result Date: 04/08/2022    ECHOCARDIOGRAM REPORT   Patient Name:   Erin Jackson Date of Exam: 04/08/2022  Medical Rec #:  [id number]      Height:       62.5 in Accession #:    [id number]     Weight:       207.9 lb Date of Birth:  01/12/1966      BSA:          1.955 m Patient Age:    56 years       BP:           198/108 mmHg Patient Gender: F              HR:           96 bpm. Exam Location:  Inpatient Procedure: 2D Echo, Cardiac Doppler and Color Doppler Indications:    R07.9* Chest pain, unspecified  History:        Patient has no prior history of Echocardiogram examinations.                 Risk Factors:Hypertension and Diabetes.  Sonographer:    Eulah Pont RDCS Referring Phys: [phone] Meryle Ready [facility] IMPRESSIONS  1. Inferior Septal / apical hypokinesis . Left ventricular ejection fraction, by estimation, is 35 to 40%. The left ventricle has moderately decreased function. The left ventricle demonstrates regional wall motion abnormalities (see scoring diagram/findings for description). The left ventricular internal cavity size was mildly dilated. Left ventricular diastolic parameters were normal.  2. Right ventricular systolic function is normal. The right ventricular size is normal.  3. A small pericardial effusion is present. The pericardial  effusion is posterior to the left ventricle and anterior to the right ventricle.  4. The mitral valve is abnormal. Trivial mitral valve regurgitation. No evidence of mitral stenosis.  5. The aortic valve was not well visualized. There is mild calcification of the aortic valve. There is mild thickening of the aortic valve. Aortic valve regurgitation is trivial. Aortic valve sclerosis is present, with no evidence of aortic valve stenosis.  6. The inferior vena cava is normal in size with greater than 50% respiratory variability, suggesting right atrial pressure of 3 mmHg. FINDINGS  Left Ventricle: Inferior Septal / apical hypokinesis. Left ventricular ejection fraction, by estimation, is 35 to 40%. The left ventricle has moderately decreased function. The left ventricle demonstrates regional wall motion abnormalities. The left ventricular internal cavity size was mildly dilated. There is no left ventricular hypertrophy. Left ventricular diastolic parameters were normal. Right Ventricle: The right ventricular size is normal. No increase in right ventricular wall thickness. Right ventricular systolic function is normal. Left Atrium: Left atrial size was normal in size. Right Atrium: Right atrial size was normal in size. Pericardium: A small pericardial effusion is present. The pericardial effusion is posterior to the left ventricle and anterior to the right ventricle. Mitral Valve: The mitral valve is abnormal. There is mild thickening of the mitral valve leaflet(s). There is mild calcification of the mitral valve leaflet(s). Mild mitral annular calcification. Trivial mitral valve regurgitation. No evidence of mitral valve stenosis. Tricuspid Valve: The tricuspid valve is normal in structure. Tricuspid valve regurgitation is trivial. No evidence of tricuspid stenosis. Aortic Valve: The aortic valve was not well visualized. There is mild calcification of the aortic valve. There is mild thickening of the aortic valve.  Aortic valve regurgitation is trivial. Aortic regurgitation PHT measures 481 msec. Aortic valve sclerosis is present, with no evidence of aortic valve stenosis. Pulmonic Valve: The pulmonic valve was normal in structure. Pulmonic valve regurgitation is not visualized. No evidence of  pulmonic stenosis. Aorta: The aortic root is normal in size and structure. Venous: The inferior vena cava is normal in size with greater than 50% respiratory variability, suggesting right atrial pressure of 3 mmHg. IAS/Shunts: No atrial level shunt detected by color flow Doppler.  LEFT VENTRICLE PLAX 2D LVIDd:         4.70 cm      Diastology LVIDs:         3.90 cm      LV e' medial:    5.93 cm/s LV PW:         1.10 cm      LV E/e' medial:  12.5 LV IVS:        1.00 cm      LV e' lateral:   6.68 cm/s LVOT diam:     2.00 cm      LV E/e' lateral: 11.1 LV SV:         60 LV SV Index:   31 LVOT Area:     3.14 cm  LV Volumes (MOD) LV vol d, MOD A2C: 126.0 ml LV vol d, MOD A4C: 93.4 ml LV vol s, MOD A2C: 75.2 ml LV vol s, MOD A4C: 48.9 ml LV SV MOD A2C:     50.8 ml LV SV MOD A4C:     93.4 ml LV SV MOD BP:      46.4 ml RIGHT VENTRICLE RV S prime:     11.90 cm/s TAPSE (M-mode): 1.7 cm LEFT ATRIUM             Index        RIGHT ATRIUM          Index LA diam:        2.70 cm 1.38 cm/m   RA Area:     9.28 cm LA Vol (A2C):   38.6 ml 19.74 ml/m  RA Volume:   17.80 ml 9.10 ml/m LA Vol (A4C):   31.5 ml 16.11 ml/m LA Biplane Vol: 36.0 ml 18.41 ml/m  AORTIC VALVE LVOT Vmax:         120.00 cm/s LVOT Vmean:        79.900 cm/s LVOT VTI:          0.190 m AI PHT:            481 msec AR Vena Contracta: 0.20 cm  AORTA Ao Root diam: 3.20 cm Ao Asc diam:  3.60 cm MITRAL VALVE MV Area (PHT): 5.13 cm    SHUNTS MV Decel Time: 148 msec    Systemic VTI:  0.19 m MV E velocity: 74.10 cm/s  Systemic Diam: 2.00 cm MV A velocity: 61.30 cm/s MV E/A ratio:  1.21 Charlton Haws MD Electronically signed by Charlton Haws MD Signature Date/Time: 04/08/2022/1:51:06 PM    Final     CT Angio Chest/Abd/Pel for Dissection W and/or Wo Contrast  Result Date: 04/08/2022 CLINICAL DATA:  Chest pain and back pain. EXAM: CT ANGIOGRAPHY CHEST, ABDOMEN AND PELVIS TECHNIQUE: Non-contrast CT of the chest was initially obtained. Multidetector CT imaging through the chest, abdomen and pelvis was performed using the standard protocol during bolus administration of intravenous contrast. Multiplanar reconstructed images and MIPs were obtained and reviewed to evaluate the vascular anatomy. RADIATION DOSE REDUCTION: This exam was performed according to the departmental dose-optimization program which includes automated exposure control, adjustment of the mA and/or kV according to patient size and/or use of iterative reconstruction technique. CONTRAST:  OMNIPAQUE IOHEXOL 350 MG/ML SOLN COMPARISON:  None.  FINDINGS: CTA CHEST FINDINGS Cardiovascular: Preferential opacification of the thoracic aorta. No evidence of thoracic aortic aneurysm or dissection. Normal heart size. There is a small pericardial effusion. Mediastinum/Nodes: No enlarged mediastinal, hilar, or axillary lymph nodes. Thyroid gland, trachea, and esophagus demonstrate no significant findings. Lungs/Pleura: There is minimal atelectasis in the inferior left upper lobe. The lungs are otherwise clear. No pleural effusion or pneumothorax identified. Musculoskeletal: No acute fractures are identified. There is an ill-defined low attenuation collection in the left breast measuring approximately 4.0 x 7.4 x 3.0 cm. There is no surrounding enhancement. Review of the MIP images confirms the above findings. CTA ABDOMEN AND PELVIS FINDINGS VASCULAR Aorta: Normal caliber aorta without aneurysm, dissection, vasculitis or significant stenosis. There is severe atherosclerotic disease throughout the aorta. There ulcerated plaques in the proximal abdominal aorta. There is also calcified atherosclerotic disease. Celiac: Patent without evidence of aneurysm,  dissection, vasculitis or significant stenosis. SMA: Patent without evidence of aneurysm, dissection, vasculitis or significant stenosis. Renals: Severe focal stenosis origin right renal artery. Left renal artery within normal limits. IMA: Not seen. Inflow: Patent without evidence of aneurysm, dissection, vasculitis or significant stenosis. There is occlusion of the visualized proximal left femoral artery. Veins: No obvious venous abnormality within the limitations of this arterial phase study. Review of the MIP images confirms the above findings. NON-VASCULAR Hepatobiliary: No focal liver abnormality is seen. No gallstones, gallbladder wall thickening, or biliary dilatation. Pancreas: Unremarkable. No pancreatic ductal dilatation or surrounding inflammatory changes. Spleen: Normal in size without focal abnormality. Adrenals/Urinary Tract: There is some patchy areas of cortical hypodensity in the superior pole the right kidney. There is no hydronephrosis or perinephric fluid. Adrenal glands and bladder are within normal limits. Stomach/Bowel: Stomach is within normal limits. Appendix appears normal. No evidence of bowel wall thickening, distention, or inflammatory changes. Lymphatic: No enlarged lymph nodes are seen. Reproductive: Status post hysterectomy. No adnexal masses. Other: No abdominal wall hernia or abnormality. No abdominopelvic ascites. Musculoskeletal: No acute or significant osseous findings. Review of the MIP images confirms the above findings. IMPRESSION: 1. No evidence for aortic dissection or aneurysm. 2. Severe atherosclerotic disease of the abdominal aorta with noncalcified plaque and ulcerated plaque. 3. 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 4. Severe focal stenosis origin of the right renal artery. 5. IMA not visualized. 6. Patchy hypodensity superior pole the right kidney, indeterminate. Findings may represent pyelonephritis or infarct. Other focal lesion can not be  excluded. Consider follow-up evaluation with dedicated renal CT or MRI. 7. Small pericardial effusion. 8. Low-density collection in the left breast, indeterminate. Findings may be related to fluid collection or resolving hematoma. Recommend clinical correlation. Consider ultrasound. Electronically Signed   By: Darliss Cheney M.D.   On: 04/08/2022 00:53        Scheduled Meds:  atorvastatin  40 mg Oral Daily   cloNIDine  0.4 mg Oral BID   insulin aspart  0-9 Units Subcutaneous TID WC   methylPREDNISolone (SOLU-MEDROL) injection  80 mg Intravenous Q24H   pantoprazole  40 mg Oral Daily   traZODone  150 mg Oral QHS   Continuous Infusions:  sodium chloride 100 mL/hr at 04/08/22 0951   famotidine (PEPCID) IV Stopped (04/08/22 1048)     LOS: 0 days        Huey Bienenstock, MD [facility]   To contact the attending provider between 7A-7P or the covering provider during after hours 7P-7A, please log into the web site [email or web address] and access using universal [facility] password for that web site. If you do not have the password, please call the hospital operator.  04/08/2022, 4:19 PM

## 2022-04-08 NOTE — H&P (Signed)
History and Physical    Erin Jackson KKX:[id number] DOB: 1966-05-26 DOA: 04/07/2022  PCP: Sonia Side., FNP  Patient coming from: Home.  Chief Complaint: Itching and chest pain.  HPI: Erin Jackson is a 56 y.o. female with history of hypertension, diabetes mellitus, anxiety, hyperlipidemia presents to the ER after patient has been having persistent itching over the last 2 days for which patient took 2 doses of epinephrine shots and also 6 tablets of Benadryl.  Patient took Benadryl yesterday morning around 8 AM.  Patient states it was over-the-counter Benadryl tablets.  In the afternoon she took 1 shot of epi pain.  And 6 hours later she took 1 more short.  Since then she also started having some chest pressure.  Over the last few months patient also has been having pain in the left lower extremity on trying to walk.   Patient states she has not taken any new medication and has not had any new clothes or any new detergents or any perfumes.    ED Course: In the ER patient was tachycardic with EKG showing sinus tachycardia and QTc of 468 ms.  Patient was given Solu-Medrol Pepcid for the itching along with doxepin.  CT angiogram of the chest was done which was negative for PE but did show some pericardial effusion also left femoral artery occlusion also have findings on the right kidney concerning for possible infarct versus pyelonephritis.  Patient admitted for further work-up.  Cardiac markers were negative.  Findings on the kidney  Review of Systems: As per HPI, rest all negative.   Past Medical History:  Diagnosis Date   Anxiety    Chronic back pain    Diabetes mellitus without complication ([place])    GERD (gastroesophageal reflux disease)    Hypertension     Past Surgical History:  Procedure Laterality Date   ABDOMINAL HYSTERECTOMY     BREAST LUMPECTOMY Left 03/25/2022   Procedure: LEFT BREAST LUMPECTOMY;  Surgeon: Jovita Kussmaul, MD;  Location: [facility];   Service: General;  Laterality: Left;   BREAST REDUCTION SURGERY Bilateral 08/15/2018   Procedure: BILATERAL MAMMARY REDUCTION  (BREAST);  Surgeon: Cristine Polio, MD;  Location: [facility];  Service: Plastics;  Laterality: Bilateral;   BREAST REDUCTION SURGERY Left 09/25/2019   Procedure: EXCISION OF FAT NECROSIS OF LEFT BREAST;  Surgeon: Cristine Polio, MD;  Location: [facility];  Service: Plastics;  Laterality: Left;   CARPAL TUNNEL RELEASE Right 06/17/2020   right thumb CMC arthroplasty with tendon transfer and right carpal tunnel release.    CERVICAL SPINE SURGERY  2015   has a metal plate   FOOT SURGERY Left 2014   HAND SURGERY     REDUCTION MAMMAPLASTY Bilateral 07/2018     reports that she has been smoking cigarettes. She has been smoking an average of .5 packs per day. She has never used smokeless tobacco. She reports current alcohol use. She reports that she does not use drugs.  Allergies  Allergen Reactions   Citrus Shortness Of Breath   Cucumber Extract Anaphylaxis   Norvasc [Amlodipine] Other (See Comments)    Muscle became very tight   Apple Juice    Other Other (See Comments)    Tree nuts, walnuts   Aspirin Rash   Ibuprofen Rash   Niacin And Related Rash    Family History  Problem Relation Age of Onset   Breast cancer Maternal Aunt    Breast cancer Maternal  Grandmother     Prior to Admission medications   Medication Sig Start Date End Date Taking? Authorizing Provider  alprazolam Duanne Moron) 2 MG tablet Take 2 mg by mouth 2 (two) times daily as needed for anxiety.   Yes [provider]  cloNIDine (CATAPRES) 0.2 MG tablet Take 0.4 mg by mouth 2 (two) times daily.    Yes [provider]  EPINEPHRINE 0.3 mg/0.3 mL IJ SOAJ injection Inject 0.3 mLs (0.3 mg total) into the muscle once as needed (allergic reaction). Patient taking differently: Inject 0.3 mg into the muscle as needed for anaphylaxis (allergic reaction).  10/04/18  Yes Fabian November, MD  HYDROcodone-acetaminophen (NORCO) 10-325 MG tablet Take 1 tablet by mouth in the morning and at bedtime. 03/20/22  Yes [provider]  metFORMIN (GLUCOPHAGE) 500 MG tablet Take 500 mg by mouth 2 (two) times daily with a meal.   Yes [provider]  NARCAN 4 MG/0.1ML LIQD nasal spray kit Place 0.4 mg into the nose as needed (opoid overdose). 01/08/20  Yes [provider]  traZODone (DESYREL) 150 MG tablet Take 150 mg by mouth at bedtime.   Yes [provider]  XTAMPZA ER 13.5 MG C12A Take 1 capsule by mouth 2 (two) times daily as needed (pain). 03/20/22  Yes [provider]  oxyCODONE (ROXICODONE) 5 MG immediate release tablet Take 1 tablet (5 mg total) by mouth every 6 (six) hours as needed for severe pain. Patient not taking: Reported on 04/08/2022 03/25/22   Jovita Kussmaul, MD    Physical Exam: Constitutional: Moderately built and nourished. Vitals:   04/08/22 0221 04/08/22 0222 04/08/22 0224 04/08/22 0401  BP: (!) 161/121   (!) 163/111  Pulse: (!) 115 (!) 115 (!) 141 (!) 119  Resp: (!) 29 17 (!) 30 20  Temp:      TempSrc:      SpO2:  98% 93% 95%   Eyes: Anicteric no pallor. ENMT: No tongue swelling or lip swelling. Neck: No stridor. Respiratory: No rhonchi or crepitations. Cardiovascular: S1-S2 heard. Abdomen: Soft nontender bowel sound present. Musculoskeletal: No edema. Skin: No rash. Neurologic: Alert awake oriented to time place and person.  Moves all extremities. Psychiatric: Appears normal.  Normal affect.   Labs on Admission: I have personally reviewed following labs and imaging studies  CBC: Recent Labs  Lab 04/07/22 2229  WBC 10.3  HGB 13.5  HCT 41.6  MCV 91.0  PLT 443   Basic Metabolic Panel: Recent Labs  Lab 04/07/22 2229  NA 136  K 4.0  CL 101  CO2 24  GLUCOSE 152*  BUN 11  CREATININE 1.25*  CALCIUM 9.5   GFR: Estimated Creatinine Clearance: 53 mL/min (A) (by C-G formula  based on SCr of 1.25 mg/dL (H)). Liver Function Tests: No results for input(s): AST, ALT, ALKPHOS, BILITOT, PROT, ALBUMIN in the last 168 hours. No results for input(s): LIPASE, AMYLASE in the last 168 hours. No results for input(s): AMMONIA in the last 168 hours. Coagulation Profile: No results for input(s): INR, PROTIME in the last 168 hours. Cardiac Enzymes: No results for input(s): CKTOTAL, CKMB, CKMBINDEX, TROPONINI in the last 168 hours. BNP (last 3 results) No results for input(s): PROBNP in the last 8760 hours. HbA1C: No results for input(s): HGBA1C in the last 72 hours. CBG: No results for input(s): GLUCAP in the last 168 hours. Lipid Profile: No results for input(s): CHOL, HDL, LDLCALC, TRIG, CHOLHDL, LDLDIRECT in the last 72 hours. Thyroid Function Tests: No results  for input(s): TSH, T4TOTAL, FREET4, T3FREE, THYROIDAB in the last 72 hours. Anemia Panel: No results for input(s): VITAMINB12, FOLATE, FERRITIN, TIBC, IRON, RETICCTPCT in the last 72 hours. Urine analysis:    Component Value Date/Time   COLORURINE YELLOW 01/20/2017 0916   APPEARANCEUR CLEAR 01/20/2017 0916   LABSPEC 1.018 01/20/2017 0916   PHURINE 5.0 01/20/2017 0916   GLUCOSEU NEGATIVE 01/20/2017 0916   HGBUR NEGATIVE 01/20/2017 0916   BILIRUBINUR NEGATIVE 01/20/2017 0916   KETONESUR NEGATIVE 01/20/2017 0916   PROTEINUR 100 (A) 01/20/2017 0916   NITRITE NEGATIVE 01/20/2017 0916   LEUKOCYTESUR NEGATIVE 01/20/2017 0916   Sepsis Labs: '@LABRCNTIP' (procalcitonin:4,lacticidven:4) )No results found for this or any previous visit (from the past 240 hour(s)).   Radiological Exams on Admission: CT Angio Chest/Abd/Pel for Dissection W and/or Wo Contrast  Result Date: 04/08/2022 CLINICAL DATA:  Chest pain and back pain. EXAM: CT ANGIOGRAPHY CHEST, ABDOMEN AND PELVIS TECHNIQUE: Non-contrast CT of the chest was initially obtained. Multidetector CT imaging through the chest, abdomen and pelvis was performed using  the standard protocol during bolus administration of intravenous contrast. Multiplanar reconstructed images and MIPs were obtained and reviewed to evaluate the vascular anatomy. RADIATION DOSE REDUCTION: This exam was performed according to the departmental dose-optimization program which includes automated exposure control, adjustment of the mA and/or kV according to patient size and/or use of iterative reconstruction technique. CONTRAST:  161m OMNIPAQUE IOHEXOL 350 MG/ML SOLN COMPARISON:  None. FINDINGS: CTA CHEST FINDINGS Cardiovascular: Preferential opacification of the thoracic aorta. No evidence of thoracic aortic aneurysm or dissection. Normal heart size. There is a small pericardial effusion. Mediastinum/Nodes: No enlarged mediastinal, hilar, or axillary lymph nodes. Thyroid gland, trachea, and esophagus demonstrate no significant findings. Lungs/Pleura: There is minimal atelectasis in the inferior left upper lobe. The lungs are otherwise clear. No pleural effusion or pneumothorax identified. Musculoskeletal: No acute fractures are identified. There is an ill-defined low attenuation collection in the left breast measuring approximately 4.0 x 7.4 x 3.0 cm. There is no surrounding enhancement. Review of the MIP images confirms the above findings. CTA ABDOMEN AND PELVIS FINDINGS VASCULAR Aorta: Normal caliber aorta without aneurysm, dissection, vasculitis or significant stenosis. There is severe atherosclerotic disease throughout the aorta. There ulcerated plaques in the proximal abdominal aorta. There is also calcified atherosclerotic disease. Celiac: Patent without evidence of aneurysm, dissection, vasculitis or significant stenosis. SMA: Patent without evidence of aneurysm, dissection, vasculitis or significant stenosis. Renals: Severe focal stenosis origin right renal artery. Left renal artery within normal limits. IMA: Not seen. Inflow: Patent without evidence of aneurysm, dissection, vasculitis or  significant stenosis. There is occlusion of the visualized proximal left femoral artery. Veins: No obvious venous abnormality within the limitations of this arterial phase study. Review of the MIP images confirms the above findings. NON-VASCULAR Hepatobiliary: No focal liver abnormality is seen. No gallstones, gallbladder wall thickening, or biliary dilatation. Pancreas: Unremarkable. No pancreatic ductal dilatation or surrounding inflammatory changes. Spleen: Normal in size without focal abnormality. Adrenals/Urinary Tract: There is some patchy areas of cortical hypodensity in the superior pole the right kidney. There is no hydronephrosis or perinephric fluid. Adrenal glands and bladder are within normal limits. Stomach/Bowel: Stomach is within normal limits. Appendix appears normal. No evidence of bowel wall thickening, distention, or inflammatory changes. Lymphatic: No enlarged lymph nodes are seen. Reproductive: Status post hysterectomy. No adnexal masses. Other: No abdominal wall hernia or abnormality. No abdominopelvic ascites. Musculoskeletal: No acute or significant osseous findings. Review of the MIP images confirms the above findings.  IMPRESSION: 1. No evidence for aortic dissection or aneurysm. 2. Severe atherosclerotic disease of the abdominal aorta with noncalcified plaque and ulcerated plaque. 3. 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 4. Severe focal stenosis origin of the right renal artery. 5. IMA not visualized. 6. Patchy hypodensity superior pole the right kidney, indeterminate. Findings may represent pyelonephritis or infarct. Other focal lesion can not be excluded. Consider follow-up evaluation with dedicated renal CT or MRI. 7. Small pericardial effusion. 8. Low-density collection in the left breast, indeterminate. Findings may be related to fluid collection or resolving hematoma. Recommend clinical correlation. Consider ultrasound. Electronically Signed   By: Ronney Asters  M.D.   On: 04/08/2022 00:53    EKG: Independently reviewed.  Sinus tachycardia with a QTc of 460 ms.  Assessment/Plan Principal Problem:   Allergic reaction Active Problems:   Hypertension associated with diabetes ([place])   T2DM (type 2 diabetes mellitus) ([place])   Chest pain    Allergic reaction cause not clear.  Patient states she at times gets frequent allergies last 1 was in December when patient was given EpiPen.  We will continue to monitor we will keep patient on IV Solu-Medrol and Pepcid.  Since patient took a large amount of Benadryl we will avoid that. Sinus tachycardia likely from using EpiPen could also be from using Benadryl.  We will closely monitor patient's rhythm in telemetry and also check QTc's every 6 hours. Chest pain likely will from tachycardia.  CT scan did show some pericardial effusion.  Will check 2D echo. Left femoral artery occlusion will need vascular surgery consult.  Patient does complain of pain on walking. Possible infarct versus pyonephritis of the right kidney.  We will check UA.  Get blood cultures. Hypertension on clonidine. Diabetes mellitus type 2 we will keep patient on sliding scale coverage.   DVT prophylaxis: SCDs for now since patient is allergic reaction we will closely monitor her respiratory status. Code Status: Full code. Family Communication: Discussed with patient. Disposition Plan: Home. Consults called: None. Admission status: Observation.   Rise Patience MD [facility] Pager [phone].  If 7PM-7AM, please contact night-coverage [email or web address] Password [facility]  04/08/2022, 5:35 AM

## 2022-04-08 NOTE — Progress Notes (Signed)
?  Transition of Care (TOC) Screening Note ? ? ?Patient Details  ?Name: Erin Jackson ?Date of Birth: Jan 24, 1966 ? ? ?Transition of Care (TOC) CM/SW Contact:    ?Mearl Latin, LCSW ?Phone Number: ?04/08/2022, 5:50 PM ? ? ? ?Transition of Care Department [facility]) has reviewed patient and no TOC needs have been identified at this time. We will continue to monitor patient advancement through interdisciplinary progression rounds. If new patient transition needs arise, please place a TOC consult. ? ? ?

## 2022-04-08 NOTE — Discharge Instructions (Addendum)
Follow with Primary MD Raymon Mutton., FNP in 7 days   Get CBC, CMP,  checked  by Primary MD next visit.    Activity: As tolerated with Full fall precautions use walker/cane & assistance as needed   Disposition Home    Diet: Heart Healthy , low carb.   On your next visit with your primary care physician please Get Medicines reviewed and adjusted.   Please request your Prim.MD to go over all Hospital Tests and Procedure/Radiological results at the follow up, please get all Hospital records sent to your Prim MD by signing hospital release before you go home.   If you experience worsening of your admission symptoms, develop shortness of breath, life threatening emergency, suicidal or homicidal thoughts you must seek medical attention immediately by calling 911 or calling your MD immediately  if symptoms less severe.  You Must read complete instructions/literature along with all the possible adverse reactions/side effects for all the Medicines you take and that have been prescribed to you. Take any new Medicines after you have completely understood and accpet all the possible adverse reactions/side effects.   Do not drive, operating heavy machinery, perform activities at heights, swimming or participation in water activities or provide baby sitting services if your were admitted for syncope or siezures until you have seen by Primary MD or a Neurologist and advised to do so again.  Do not drive when taking Pain medications.    Do not take more than prescribed Pain, Sleep and Anxiety Medications  Special Instructions: If you have smoked or chewed Tobacco  in the last 2 yrs please stop smoking, stop any regular Alcohol  and or any Recreational drug use.  Wear Seat belts while driving.   Please note  You were cared for by a hospitalist during your hospital stay. If you have any questions about your discharge medications or the care you received while you were in the hospital after  you are discharged, you can call the unit and asked to speak with the hospitalist on call if the hospitalist that took care of you is not available. Once you are discharged, your primary care physician will handle any further medical issues. Please note that NO REFILLS for any discharge medications will be authorized once you are discharged, as it is imperative that you return to your primary care physician (or establish a relationship with a primary care physician if you do not have one) for your aftercare needs so that they can reassess your need for medications and monitor your lab values.

## 2022-04-08 NOTE — Plan of Care (Signed)
  Problem: Education: Goal: Knowledge of General Education information will improve Description: Including pain rating scale, medication(s)/side effects and non-pharmacologic comfort measures Outcome: Progressing   Problem: Clinical Measurements: Goal: Ability to maintain clinical measurements within normal limits will improve Outcome: Progressing Goal: Will remain free from infection Outcome: Progressing   

## 2022-04-09 ENCOUNTER — Inpatient Hospital Stay (HOSPITAL_COMMUNITY): Payer: Medicare Other

## 2022-04-09 ENCOUNTER — Inpatient Hospital Stay (HOSPITAL_COMMUNITY)
Admission: EM | Disposition: A | Discharge status: Home / Self Care | Payer: Self-pay | Source: Home / Self Care | Attending: Internal Medicine

## 2022-04-09 DIAGNOSIS — I200 Unstable angina: Secondary | ICD-10-CM

## 2022-04-09 DIAGNOSIS — T7840XA Allergy, unspecified, initial encounter: Secondary | ICD-10-CM

## 2022-04-09 DIAGNOSIS — R7989 Other specified abnormal findings of blood chemistry: Secondary | ICD-10-CM

## 2022-04-09 DIAGNOSIS — E119 Type 2 diabetes mellitus without complications: Secondary | ICD-10-CM

## 2022-04-09 HISTORY — PX: LEFT HEART CATH AND CORONARY ANGIOGRAPHY: CATH118249

## 2022-04-09 LAB — BASIC METABOLIC PANEL
Anion gap: 11 (ref 5–15)
BUN: 29 mg/dL — ABNORMAL HIGH (ref 6–20)
CO2: 22 mmol/L (ref 22–32)
Calcium: 8.7 mg/dL — ABNORMAL LOW (ref 8.9–10.3)
Chloride: 99 mmol/L (ref 98–111)
Creatinine, Ser: 1.84 mg/dL — ABNORMAL HIGH (ref 0.44–1.00)
GFR, Estimated: 31 mL/min — ABNORMAL LOW (ref >60)
Glucose, Bld: 151 mg/dL — ABNORMAL HIGH (ref 70–99)
Potassium: 4.1 mmol/L (ref 3.5–5.1)
Sodium: 132 mmol/L — ABNORMAL LOW (ref 135–145)

## 2022-04-09 LAB — LIPID PANEL
Cholesterol: 325 mg/dL — ABNORMAL HIGH (ref 0–200)
HDL: 32 mg/dL — ABNORMAL LOW (ref >40)
LDL Cholesterol: 242 mg/dL — ABNORMAL HIGH (ref 0–99)
Total CHOL/HDL Ratio: 10.2 RATIO
Triglycerides: 254 mg/dL — ABNORMAL HIGH (ref <150)
VLDL: 51 mg/dL — ABNORMAL HIGH (ref 0–40)

## 2022-04-09 LAB — D-DIMER, QUANTITATIVE: D-Dimer, Quant: 1.87 ug/mL-FEU — ABNORMAL HIGH (ref 0.00–0.50)

## 2022-04-09 LAB — CBC
HCT: 38.5 % (ref 36.0–46.0)
Hemoglobin: 12.6 g/dL (ref 12.0–15.0)
MCH: 29.2 pg (ref 26.0–34.0)
MCHC: 32.7 g/dL (ref 30.0–36.0)
MCV: 89.1 fL (ref 80.0–100.0)
Platelets: 304 10*3/uL (ref 150–400)
RBC: 4.32 MIL/uL (ref 3.87–5.11)
RDW: 13.5 % (ref 11.5–15.5)
WBC: 8.5 10*3/uL (ref 4.0–10.5)
nRBC: 0 % (ref 0.0–0.2)

## 2022-04-09 LAB — GLUCOSE, CAPILLARY
Glucose-Capillary: 142 mg/dL — ABNORMAL HIGH (ref 70–99)
Glucose-Capillary: 92 mg/dL (ref 70–99)
Glucose-Capillary: 144 mg/dL — ABNORMAL HIGH (ref 70–99)

## 2022-04-09 LAB — TROPONIN I (HIGH SENSITIVITY)
Troponin I (High Sensitivity): 9 ng/L (ref <18)
Troponin I (High Sensitivity): 8 ng/L (ref <18)

## 2022-04-09 LAB — HEMOGLOBIN A1C
Hgb A1c MFr Bld: 6.5 % — ABNORMAL HIGH (ref 4.8–5.6)
Mean Plasma Glucose: 139.85 mg/dL

## 2022-04-09 SURGERY — LEFT HEART CATH AND CORONARY ANGIOGRAPHY
Anesthesia: LOCAL

## 2022-04-09 MED ORDER — PREDNISONE 10 MG PO TABS
10.0000 mg | ORAL_TABLET | Freq: Every day | ORAL | Status: DC
  Administered 2022-04-10: 10 mg via ORAL
  Filled 2022-04-09: qty 1

## 2022-04-09 MED ORDER — HEPARIN (PORCINE) IN NACL 1000-0.9 UT/500ML-% IV SOLN
INTRAVENOUS | Status: DC | PRN
  Administered 2022-04-09 (×2): 500 mL

## 2022-04-09 MED ORDER — METHYLPREDNISOLONE SODIUM SUCC 40 MG IJ SOLR
40.0000 mg | Freq: Once | INTRAMUSCULAR | Status: AC
  Administered 2022-04-09: 40 mg via INTRAVENOUS
  Filled 2022-04-09: qty 1

## 2022-04-09 MED ORDER — NITROGLYCERIN IN D5W 200-5 MCG/ML-% IV SOLN
0.0000 ug/min | INTRAVENOUS | Status: DC
  Administered 2022-04-09: 5 ug/min via INTRAVENOUS
  Filled 2022-04-09: qty 250

## 2022-04-09 MED ORDER — MIDAZOLAM HCL 2 MG/2ML IJ SOLN
INTRAMUSCULAR | Status: DC | PRN
  Administered 2022-04-09: 1 mg via INTRAVENOUS

## 2022-04-09 MED ORDER — SODIUM CHLORIDE 0.9 % IV SOLN: 250.0000 mL | INTRAVENOUS | Status: DC | PRN

## 2022-04-09 MED ORDER — IOHEXOL 350 MG/ML SOLN
INTRAVENOUS | Status: DC | PRN
  Administered 2022-04-09: 35 mL

## 2022-04-09 MED ORDER — SODIUM CHLORIDE 0.9 % IV SOLN: INTRAVENOUS | Status: DC

## 2022-04-09 MED ORDER — ALPRAZOLAM 0.5 MG PO TABS
0.5000 mg | ORAL_TABLET | Freq: Three times a day (TID) | ORAL | Status: DC | PRN
  Administered 2022-04-09: 0.5 mg via ORAL

## 2022-04-09 MED ORDER — HEPARIN (PORCINE) IN NACL 1000-0.9 UT/500ML-% IV SOLN
INTRAVENOUS | Status: AC
  Filled 2022-04-09: qty 1000

## 2022-04-09 MED ORDER — LORAZEPAM 2 MG/ML IJ SOLN
1.0000 mg | Freq: Once | INTRAMUSCULAR | Status: AC
  Administered 2022-04-09: 1 mg via INTRAVENOUS
  Filled 2022-04-09: qty 1

## 2022-04-09 MED ORDER — ALPRAZOLAM 0.25 MG PO TABS
ORAL_TABLET | ORAL | Status: AC
  Filled 2022-04-09: qty 2

## 2022-04-09 MED ORDER — HEPARIN (PORCINE) 25000 UT/250ML-% IV SOLN
850.0000 [IU]/h | INTRAVENOUS | Status: DC
  Administered 2022-04-09: 850 [IU]/h via INTRAVENOUS
  Filled 2022-04-09: qty 250

## 2022-04-09 MED ORDER — FENTANYL CITRATE (PF) 100 MCG/2ML IJ SOLN
INTRAMUSCULAR | Status: AC
  Filled 2022-04-09: qty 2

## 2022-04-09 MED ORDER — HYDRALAZINE HCL 20 MG/ML IJ SOLN
10.0000 mg | INTRAMUSCULAR | Status: DC | PRN
  Administered 2022-04-09: 10 mg via INTRAVENOUS
  Filled 2022-04-09: qty 1

## 2022-04-09 MED ORDER — VERAPAMIL HCL 2.5 MG/ML IV SOLN
INTRAVENOUS | Status: AC
  Filled 2022-04-09: qty 2

## 2022-04-09 MED ORDER — LIDOCAINE HCL (PF) 1 % IJ SOLN
INTRAMUSCULAR | Status: DC | PRN
Start: 2022-04-09 — End: 2022-04-09
  Administered 2022-04-09: 2 mL
  Administered 2022-04-09: 10 mL

## 2022-04-09 MED ORDER — HEPARIN BOLUS VIA INFUSION
4000.0000 [IU] | Freq: Once | INTRAVENOUS | Status: AC
  Administered 2022-04-09: 4000 [IU] via INTRAVENOUS
  Filled 2022-04-09: qty 4000

## 2022-04-09 MED ORDER — SODIUM CHLORIDE 0.9 % IV SOLN: INTRAVENOUS | Status: AC

## 2022-04-09 MED ORDER — SODIUM CHLORIDE 0.9% FLUSH: 3.0000 mL | INTRAVENOUS | Status: DC | PRN

## 2022-04-09 MED ORDER — LABETALOL HCL 5 MG/ML IV SOLN: 10.0000 mg | INTRAVENOUS | Status: AC | PRN

## 2022-04-09 MED ORDER — SODIUM CHLORIDE 0.9% FLUSH
3.0000 mL | Freq: Two times a day (BID) | INTRAVENOUS | Status: DC
  Administered 2022-04-09 – 2022-04-10 (×2): 3 mL via INTRAVENOUS

## 2022-04-09 MED ORDER — LIDOCAINE HCL (PF) 1 % IJ SOLN
INTRAMUSCULAR | Status: AC
  Filled 2022-04-09: qty 30

## 2022-04-09 MED ORDER — NICOTINE 14 MG/24HR TD PT24
14.0000 mg | MEDICATED_PATCH | Freq: Every day | TRANSDERMAL | Status: DC
  Administered 2022-04-09 – 2022-04-10 (×2): 14 mg via TRANSDERMAL
  Filled 2022-04-09 (×2): qty 1

## 2022-04-09 MED ORDER — HEPARIN SODIUM (PORCINE) 1000 UNIT/ML IJ SOLN
INTRAMUSCULAR | Status: AC
  Filled 2022-04-09: qty 10

## 2022-04-09 MED ORDER — LACTATED RINGERS IV SOLN: INTRAVENOUS | Status: AC

## 2022-04-09 MED ORDER — NITROGLYCERIN 0.4 MG SL SUBL
0.4000 mg | SUBLINGUAL_TABLET | SUBLINGUAL | Status: DC | PRN
  Administered 2022-04-09 (×3): 0.4 mg via SUBLINGUAL
  Filled 2022-04-09: qty 1

## 2022-04-09 MED ORDER — SODIUM CHLORIDE 0.9% FLUSH: 3.0000 mL | Freq: Two times a day (BID) | INTRAVENOUS | Status: DC

## 2022-04-09 MED ORDER — HEPARIN SODIUM (PORCINE) 5000 UNIT/ML IJ SOLN
5000.0000 [IU] | Freq: Three times a day (TID) | INTRAMUSCULAR | Status: DC
  Administered 2022-04-09 – 2022-04-10 (×2): 5000 [IU] via SUBCUTANEOUS
  Filled 2022-04-09 (×2): qty 1

## 2022-04-09 MED ORDER — FENTANYL CITRATE (PF) 100 MCG/2ML IJ SOLN
INTRAMUSCULAR | Status: DC | PRN
Start: 2022-04-09 — End: 2022-04-09
  Administered 2022-04-09: 25 ug via INTRAVENOUS

## 2022-04-09 MED ORDER — OXYCODONE HCL 5 MG PO TABS: 5.0000 mg | ORAL_TABLET | Freq: Four times a day (QID) | ORAL | Status: DC | PRN

## 2022-04-09 MED ORDER — HYDROXYZINE HCL 25 MG PO TABS
50.0000 mg | ORAL_TABLET | Freq: Once | ORAL | Status: AC
  Administered 2022-04-09: 50 mg via ORAL
  Filled 2022-04-09: qty 2

## 2022-04-09 MED ORDER — ATORVASTATIN CALCIUM 80 MG PO TABS
80.0000 mg | ORAL_TABLET | Freq: Every day | ORAL | Status: DC
  Administered 2022-04-10: 80 mg via ORAL
  Filled 2022-04-09: qty 1

## 2022-04-09 MED ORDER — MIDAZOLAM HCL 2 MG/2ML IJ SOLN
INTRAMUSCULAR | Status: AC
  Filled 2022-04-09: qty 2

## 2022-04-09 MED ORDER — METOPROLOL TARTRATE 25 MG PO TABS
25.0000 mg | ORAL_TABLET | Freq: Two times a day (BID) | ORAL | Status: DC
  Administered 2022-04-09 (×2): 25 mg via ORAL
  Filled 2022-04-09 (×2): qty 1

## 2022-04-09 SURGICAL SUPPLY — 11 items
CATH INFINITI 5FR MULTPACK ANG (CATHETERS) ×1 IMPLANT
GLIDESHEATH SLEND SS 6F .021 (SHEATH) ×1 IMPLANT
GUIDEWIRE INQWIRE 1.5J.035X260 (WIRE) IMPLANT
INQWIRE 1.5J .035X260CM (WIRE) ×2
KIT HEART LEFT (KITS) ×2 IMPLANT
KIT MICROPUNCTURE NIT STIFF (SHEATH) ×1 IMPLANT
PACK CARDIAC CATHETERIZATION (CUSTOM PROCEDURE TRAY) ×2 IMPLANT
SHEATH PINNACLE 5F 10CM (SHEATH) ×1 IMPLANT
SYR MEDRAD MARK 7 150ML (SYRINGE) ×2 IMPLANT
TRANSDUCER W/STOPCOCK (MISCELLANEOUS) ×2 IMPLANT
WIRE EMERALD 3MM-J .035X150CM (WIRE) ×1 IMPLANT

## 2022-04-09 NOTE — Progress Notes (Addendum)
[facility] ? ?Subjective  - Alert and oriented this am.  Her CC is rash and heaviness in the chest.  She describes history of claudication at short distance that is lifestyle limiting.  She denies history of rest pain or non healing wounds.  ? ? ?Objective ?130/87 ?(!) 105 ?98.8 ?F (37.1 ?C) (Oral) ?(!) 21 ?92% ? ?Intake/Output Summary (Last 24 hours) at 04/09/2022 0732 ?Last data filed at 04/09/2022 0600 ?Gross per 24 hour  ?Intake 440 ml  ?Output --  ?Net 440 ml  ? ? ?Palpable femoral pulses, no pedal pulses ?B LE motor intact, no tissue loss or other ischemic changes ?Lungs non labored breathing ?Heart tachy 105-110 bpm ?General no acute distress ?ABI Findings:  ?+---------+------------------+-----+---------+--------+  ?Right    Rt Pressure (mmHg)IndexWaveform Comment   ?+---------+------------------+-----+---------+--------+  ?Brachial 155                    triphasic          ?+---------+------------------+-----+---------+--------+  ?PTA      121               0.74 biphasic           ?+---------+------------------+-----+---------+--------+  ?DP       151               0.93 triphasic          ?+---------+------------------+-----+---------+--------+  ?Great [id number]               0.69 Abnormal           ?+---------+------------------+-----+---------+--------+  ? ?+---------+------------------+-----+-------------------+-------------------  ?----+  ?Left     Lt Pressure (mmHg)IndexWaveform           Comment              ?      ?+---------+------------------+-----+-------------------+-------------------  ?----+  ?Brachial 163                    triphasic                              ?      ?+---------+------------------+-----+-------------------+-------------------  ?----+  ?PTA      66                0.40 dampened monophasic                    ?      ?+---------+------------------+-----+-------------------+-------------------  ?----+  ?DP        64                0.39 dampened monophasic                    ?      ?+---------+------------------+-----+-------------------+-------------------  ?----+  ?Great Toe                                          unable to obtain    ?      ?  pressure due to      ?      ?                                                   decreased great  ?toe      ?                                                   amplitude            ?      ?+---------+------------------+-----+-------------------+-------------------  ?----+  ? ?+-------+-----------+-----------+------------+------------+  ?ABI/TBIToday's ABIToday's TBIPrevious ABIPrevious TBI  ?+-------+-----------+-----------+------------+------------+  ?Right  0.93       0.69                                 ?+-------+-----------+-----------+------------+------------+  ?Left   0.40                                            ?+-------+-----------+-----------+------------+------------+  ? ? ?Summary:  ?Right: Resting right ankle-brachial index indicates mild right lower  ?extremity arterial disease. The right toe-brachial index is abnormal.  ? ?Left: Resting left ankle-brachial index indicates severe left lower  ?extremity arterial disease.  ? ?Assessment/Planning: ? PAD with left infrainguinal arterial disease.  She would benefit from an angiogram and possible intervention on the left LE for lifestyle limiting claudication.  She maintains B palpable femoral pulses without rest pain or tissue loss.   ? Plan per Dr. Lenell Antu in the near future.   ? ?Erin Jackson ?04/09/2022 ?7:32 AM ?-- ? ?Laboratory ?Lab Results: ?Recent Labs  ?  04/07/22 ?2229 04/08/22 ?1191  ?WBC 10.3 7.7  ?HGB 13.5 13.2  ?HCT 41.6 41.0  ?PLT 315 278  ? ?BMET ?Recent Labs  ?  04/07/22 ?2229 04/08/22 ?4782  ?NA 136 131*  ?K 4.0 4.6  ?CL 101 99  ?CO2 24 20*  ?GLUCOSE 152* 236*  ?BUN 11 14  ?CREATININE 1.25* 1.18*   ?CALCIUM 9.5 9.2  ? ? ?COAG ?No results found for: INR, PROTIME ?No results found for: PTT ? ?VASCULAR STAFF ADDENDUM: ?I agree with the above.  ?No role for urgent revascularization for claudication. ?Recommend best medical therapy for PAD (see my consult note for full details) ?Follow up with me in 4-6 weeks to discuss a treatment plan going forward.  ?Please call for any questions. ? ?Rande Brunt. Lenell Antu, MD ?[facility] ?Office Phone Number: [phone] ?04/09/2022 10:34 AM ? ? ? ?

## 2022-04-09 NOTE — Progress Notes (Signed)
ANTICOAGULATION CONSULT NOTE ? ?Pharmacy Consult for Heparin ?Indication: chest pain/ACS ? ?Allergies  ?Allergen Reactions  ? Citrus Shortness Of Breath  ? Cucumber Extract Anaphylaxis  ? Norvasc [Amlodipine] Other (See Comments)  ?  Muscle became very tight  ? Apple Juice   ? Other Other (See Comments)  ?  Tree nuts, walnuts  ? Aspirin Rash  ? Ibuprofen Rash  ? Niacin And Related Rash  ? ? ?Patient Measurements: ?Height: 5\' 2"  (157.5 cm) ?Weight: 94.6 kg (208 lb 8.9 oz) ?IBW/kg (Calculated) : 50.1 ? ?Heparin Dosing Weight: 72 kg ? ?Vital Signs: ?Temp: 98.8 ?F (37.1 ?C) (04/20 0306) ?Temp Source: Oral (04/20 0306) ?BP: 204/118 (04/20 05-21-2005) ? ?Labs: ?Recent Labs  ?  04/07/22 ?2229 04/08/22 ?0045 04/08/22 ?04/10/22 04/08/22 ?04/10/22 04/09/22 ?0740  ?HGB 13.5  --  13.2  --  12.6  ?HCT 41.6  --  41.0  --  38.5  ?PLT 315  --  278  --  304  ?CREATININE 1.25*  --  1.18*  --   --   ?TROPONINIHS 8 11 9 8   --   ? ? ?Estimated Creatinine Clearance: 55.7 mL/min (A) (by C-G formula based on SCr of 1.18 mg/dL (H)). ? ? ?Medical History: ?Past Medical History:  ?Diagnosis Date  ? Anxiety   ? Chronic back pain   ? Diabetes mellitus without complication ([facility])   ? GERD (gastroesophageal reflux disease)   ? Hypertension   ? ? ?Assessment: ?56 y.o. female with history of hypertension, diabetes mellitus, anxiety, hyperlipidemia presents to the ER after patient has been having painful hives and chest pain following 2 epi pen administrations PTA. No anticoagulation PTA. Pharmacy consulted to initiate heparin infusion for ACS/CP. ? ? CBC stable, platelets 304.  ? ?Goal of Therapy:  ?Heparin level 0.3-0.7 units/ml ?Monitor platelets by anticoagulation protocol: Yes ?  ?Plan ?Heparin IV 4000 unit bolus x1 ?Start heparin infusion at 850 units/hr ?Check heparin level in 6 hours and daily while on heparin ?Continue to monitor H&H and platelets ? ? ? ?Thank you for allowing pharmacy to be a part of this patient?s care. ? ? ,  PharmD ?Clinical Pharmacist ? ? ? ?

## 2022-04-09 NOTE — Progress Notes (Signed)
Lower extremity venous bilateral study completed.   Please see CV Proc for preliminary results.   Ronique Simerly, RDMS, RVT  

## 2022-04-09 NOTE — Consult Note (Signed)
Referring Physician: Dr. Phillips Climes, MD  Erin Jackson is an 56 y.o. female.                       Chief Complaint: Chest pain  HPI: 56 years old female with PMH of HTN, Type 2 DM, CHF, Tobacco use disorder, CKD II and PVD has chest pain partially responding to SL NTG x 3.  Her EKG is near normal. Troponin I is normal so far. Echocardiogram showed mildly dilated LV  with EF 35-40 %  Past Medical History:  Diagnosis Date   Anxiety    Chronic back pain    Diabetes mellitus without complication ([place])    GERD (gastroesophageal reflux disease)    Hypertension       Past Surgical History:  Procedure Laterality Date   ABDOMINAL HYSTERECTOMY     BREAST LUMPECTOMY Left 03/25/2022   Procedure: LEFT BREAST LUMPECTOMY;  Surgeon: Jovita Kussmaul, MD;  Location: [facility];  Service: General;  Laterality: Left;   BREAST REDUCTION SURGERY Bilateral 08/15/2018   Procedure: BILATERAL MAMMARY REDUCTION  (BREAST);  Surgeon: Cristine Polio, MD;  Location: [facility];  Service: Plastics;  Laterality: Bilateral;   BREAST REDUCTION SURGERY Left 09/25/2019   Procedure: EXCISION OF FAT NECROSIS OF LEFT BREAST;  Surgeon: Cristine Polio, MD;  Location: [facility];  Service: Plastics;  Laterality: Left;   CARPAL TUNNEL RELEASE Right 06/17/2020   right thumb CMC arthroplasty with tendon transfer and right carpal tunnel release.    CERVICAL SPINE SURGERY  2015   has a metal plate   FOOT SURGERY Left 2014   HAND SURGERY     REDUCTION MAMMAPLASTY Bilateral 07/2018    Family History  Problem Relation Age of Onset   Breast cancer Maternal Aunt    Breast cancer Maternal Grandmother    Social History:  reports that she has been smoking cigarettes. She has been smoking an average of .5 packs per day. She has never used smokeless tobacco. She reports current alcohol use. She reports that she does not use drugs.  Allergies:  Allergies  Allergen Reactions    Citrus Shortness Of Breath   Cucumber Extract Anaphylaxis   Norvasc [Amlodipine] Other (See Comments)    Muscle became very tight   Apple Juice    Other Other (See Comments)    Tree nuts, walnuts   Aspirin Rash   Ibuprofen Rash   Niacin And Related Rash    Medications Prior to Admission  Medication Sig Dispense Refill   alprazolam (XANAX) 2 MG tablet Take 2 mg by mouth 2 (two) times daily as needed for anxiety.     cloNIDine (CATAPRES) 0.2 MG tablet Take 0.4 mg by mouth 2 (two) times daily.      EPINEPHRINE 0.3 mg/0.3 mL IJ SOAJ injection Inject 0.3 mLs (0.3 mg total) into the muscle once as needed (allergic reaction). (Patient taking differently: Inject 0.3 mg into the muscle as needed for anaphylaxis (allergic reaction).) 1 Device 1   HYDROcodone-acetaminophen (NORCO) 10-325 MG tablet Take 1 tablet by mouth in the morning and at bedtime.     metFORMIN (GLUCOPHAGE) 500 MG tablet Take 500 mg by mouth 2 (two) times daily with a meal.     NARCAN 4 MG/0.1ML LIQD nasal spray kit Place 0.4 mg into the nose as needed (opoid overdose).     traZODone (DESYREL) 150 MG tablet Take 150 mg by mouth at bedtime.  XTAMPZA ER 13.5 MG C12A Take 1 capsule by mouth 2 (two) times daily as needed (pain).     oxyCODONE (ROXICODONE) 5 MG immediate release tablet Take 1 tablet (5 mg total) by mouth every 6 (six) hours as needed for severe pain. (Patient not taking: Reported on 04/08/2022) 10 tablet 0    Results for orders placed or performed during the hospital encounter of 04/07/22 (from the past 48 hour(s))  Basic metabolic panel     Status: Abnormal   Collection Time: 04/07/22 10:29 PM  Result Value Ref Range   Sodium 136 135 - 145 mmol/L   Potassium 4.0 3.5 - 5.1 mmol/L   Chloride 101 98 - 111 mmol/L   CO2 24 22 - 32 mmol/L   Glucose, Bld 152 (H) 70 - 99 mg/dL    Comment: Glucose reference range applies only to samples taken after fasting for at least 8 hours.   BUN 11 6 - 20 mg/dL   Creatinine,  Ser 1.25 (H) 0.44 - 1.00 mg/dL   Calcium 9.5 8.9 - 10.3 mg/dL   GFR, Estimated 50 (L) >60 mL/min    Comment: (NOTE) Calculated using the CKD-EPI Creatinine Equation (2021)    Anion gap 11 5 - 15    Comment: Performed at [facility] [address], [place], [place] [address]  CBC     Status: None   Collection Time: 04/07/22 10:29 PM  Result Value Ref Range   WBC 10.3 4.0 - 10.5 K/uL   RBC 4.57 3.87 - 5.11 MIL/uL   Hemoglobin 13.5 12.0 - 15.0 g/dL   HCT 41.6 36.0 - 46.0 %   MCV 91.0 80.0 - 100.0 fL   MCH 29.5 26.0 - 34.0 pg   MCHC 32.5 30.0 - 36.0 g/dL   RDW 13.5 11.5 - 15.5 %   Platelets 315 150 - 400 K/uL   nRBC 0.0 0.0 - 0.2 %    Comment: Performed at [facility], [place] [address] [place], [place] [address]  Troponin I (High Sensitivity)     Status: None   Collection Time: 04/07/22 10:29 PM  Result Value Ref Range   Troponin I (High Sensitivity) 8 <18 ng/L    Comment: (NOTE) Elevated high sensitivity troponin I (hsTnI) values and significant  changes across serial measurements may suggest ACS but many other  chronic and acute conditions are known to elevate hsTnI results.  Refer to the "Links" section for chest pain algorithms and additional  guidance. Performed at [facility], [place] [address] [place], [place] [address]   Troponin I (High Sensitivity)     Status: None   Collection Time: 04/08/22 12:45 AM  Result Value Ref Range   Troponin I (High Sensitivity) 11 <18 ng/L    Comment: (NOTE) Elevated high sensitivity troponin I (hsTnI) values and significant  changes across serial measurements may suggest ACS but many other  chronic and acute conditions are known to elevate hsTnI results.  Refer to the "Links" section for chest pain algorithms and additional  guidance. Performed at [facility], [place] [address] [place], [place] [address]   HIV Antibody (routine testing w rflx)     Status: None   Collection Time: 04/08/22  6:23 AM  Result  Value Ref Range   HIV Screen 4th Generation wRfx Non Reactive Non Reactive    Comment: Performed at [facility], [place] [address], [place], [place] [address]  Comprehensive metabolic panel  Status: Abnormal   Collection Time: 04/08/22  6:23 AM  Result Value Ref Range   Sodium 131 (L) 135 - 145 mmol/L   Potassium 4.6 3.5 - 5.1 mmol/L   Chloride 99 98 - 111 mmol/L   CO2 20 (L) 22 - 32 mmol/L   Glucose, Bld 236 (H) 70 - 99 mg/dL    Comment: Glucose reference range applies only to samples taken after fasting for at least 8 hours.   BUN 14 6 - 20 mg/dL   Creatinine, Ser 1.18 (H) 0.44 - 1.00 mg/dL   Calcium 9.2 8.9 - 10.3 mg/dL   Total Protein 7.3 6.5 - 8.1 g/dL   Albumin 3.1 (L) 3.5 - 5.0 g/dL   AST 16 15 - 41 U/L   ALT 10 0 - 44 U/L   Alkaline Phosphatase 65 38 - 126 U/L   Total Bilirubin 0.6 0.3 - 1.2 mg/dL   GFR, Estimated 54 (L) >60 mL/min    Comment: (NOTE) Calculated using the CKD-EPI Creatinine Equation (2021)    Anion gap 12 5 - 15    Comment: Performed at [facility] [address] [place], [place] [address]  CBC with Differential/Platelet     Status: None   Collection Time: 04/08/22  6:23 AM  Result Value Ref Range   WBC 7.7 4.0 - 10.5 K/uL   RBC 4.51 3.87 - 5.11 MIL/uL   Hemoglobin 13.2 12.0 - 15.0 g/dL   HCT 41.0 36.0 - 46.0 %   MCV 90.9 80.0 - 100.0 fL   MCH 29.3 26.0 - 34.0 pg   MCHC 32.2 30.0 - 36.0 g/dL   RDW 13.4 11.5 - 15.5 %   Platelets 278 150 - 400 K/uL   nRBC 0.0 0.0 - 0.2 %   Neutrophils Relative % 78 %   Neutro Abs 6.0 1.7 - 7.7 K/uL   Lymphocytes Relative 21 %   Lymphs Abs 1.6 0.7 - 4.0 K/uL   Monocytes Relative 1 %   Monocytes Absolute 0.1 0.1 - 1.0 K/uL   Eosinophils Relative 0 %   Eosinophils Absolute 0.0 0.0 - 0.5 K/uL   Basophils Relative 0 %   Basophils Absolute 0.0 0.0 - 0.1 K/uL   Immature Granulocytes 0 %   Abs Immature Granulocytes 0.03 0.00 - 0.07 K/uL    Comment: Performed at [facility], [address]  [place], [place] [address]  TSH     Status: None   Collection Time: 04/08/22  6:23 AM  Result Value Ref Range   TSH 1.328 0.350 - 4.500 uIU/mL    Comment: Performed by a 3rd Generation assay with a functional sensitivity of <=0.01 uIU/mL. Performed at [facility], [place] [address], [place], [place] [address]   Troponin I (High Sensitivity)     Status: None   Collection Time: 04/08/22  6:23 AM  Result Value Ref Range   Troponin I (High Sensitivity) 9 <18 ng/L    Comment: (NOTE) Elevated high sensitivity troponin I (hsTnI) values and significant  changes across serial measurements may suggest ACS but many other  chronic and acute conditions are known to elevate hsTnI results.  Refer to the "Links" section for chest pain algorithms and additional  guidance. Performed at [facility], [place] [address], [place], [place] [address]   CBG monitoring, ED     Status: Abnormal   Collection Time: 04/08/22  7:37 AM  Result Value Ref Range   Glucose-Capillary 251 (H)  70 - 99 mg/dL    Comment: Glucose reference range applies only to samples taken after fasting for at least 8 hours.  Culture, blood (Routine X 2) w Reflex to ID Panel     Status: None (Preliminary result)   Collection Time: 04/08/22  7:52 AM   Specimen: BLOOD  Result Value Ref Range   Specimen Description BLOOD RIGHT ANTECUBITAL    Special Requests      BOTTLES DRAWN AEROBIC AND ANAEROBIC Blood Culture adequate volume   Culture      NO GROWTH < 24 HOURS Performed at [facility], [place] [address] [place], [place] [address]    Report Status PENDING   Culture, blood (Routine X 2) w Reflex to ID Panel     Status: None (Preliminary result)   Collection Time: 04/08/22  7:52 AM   Specimen: BLOOD  Result Value Ref Range   Specimen Description BLOOD BLOOD RIGHT FOREARM    Special Requests      BOTTLES DRAWN AEROBIC AND ANAEROBIC Blood Culture adequate volume   Culture      NO GROWTH < 24 HOURS Performed at [facility], [place] [address] [place], [place] [address]    Report Status PENDING   Troponin I (High Sensitivity)     Status: None   Collection Time: 04/08/22  7:52 AM  Result Value Ref Range   Troponin I (High Sensitivity) 8 <18 ng/L    Comment: (NOTE) Elevated high sensitivity troponin I (hsTnI) values and significant  changes across serial measurements may suggest ACS but many other  chronic and acute conditions are known to elevate hsTnI results.  Refer to the "Links" section for chest pain algorithms and additional  guidance. Performed at [facility], [place] [address] [place], [place] [address]   CBG monitoring, ED     Status: Abnormal   Collection Time: 04/08/22 12:16 PM  Result Value Ref Range   Glucose-Capillary 199 (H) 70 - 99 mg/dL    Comment: Glucose reference range applies only to samples taken after fasting for at least 8 hours.  Glucose, capillary     Status: Abnormal   Collection Time: 04/08/22  5:50 PM  Result Value Ref Range   Glucose-Capillary 164 (H) 70 - 99 mg/dL    Comment: Glucose reference range applies only to samples taken after fasting for at least 8 hours.  Glucose, capillary     Status: Abnormal   Collection Time: 04/08/22  9:19 PM  Result Value Ref Range   Glucose-Capillary 267 (H) 70 - 99 mg/dL    Comment: Glucose reference range applies only to samples taken after fasting for at least 8 hours.  Lipid panel     Status: Abnormal   Collection Time: 04/09/22  7:40 AM  Result Value Ref Range   Cholesterol 325 (H) 0 - 200 mg/dL   Triglycerides 254 (H) <150 mg/dL   HDL 32 (L) >40 mg/dL   Total CHOL/HDL Ratio 10.2 RATIO   VLDL 51 (H) 0 - 40 mg/dL   LDL Cholesterol 242 (H) 0 - 99 mg/dL    Comment:        Total Cholesterol/HDL:CHD Risk Coronary Heart Disease Risk Table                     Men   Women  1/2 Average Risk   3.4   3.3  Average Risk       5.0   4.4  2 X Average Risk   9.6   7.1  3 X Average Risk  23.4   11.0        Use the calculated  Patient Ratio above and the CHD Risk Table to determine the patient's CHD Risk.        ATP III CLASSIFICATION (LDL):  <100     mg/dL   Optimal  100-129  mg/dL   Near or Above                    Optimal  130-159  mg/dL   Borderline  160-189  mg/dL   High  >190     mg/dL   Very High Performed at [facility] [address] [place], [place] [address]   Hemoglobin A1c     Status: Abnormal   Collection Time: 04/09/22  7:40 AM  Result Value Ref Range   Hgb A1c MFr Bld 6.5 (H) 4.8 - 5.6 %    Comment: (NOTE) Pre diabetes:          5.7%-6.4%  Diabetes:              >6.4%  Glycemic control for   <7.0% adults with diabetes    Mean Plasma Glucose 139.85 mg/dL    Comment: Performed at [facility] [address] [place], [place] [address]  CBC     Status: None   Collection Time: 04/09/22  7:40 AM  Result Value Ref Range   WBC 8.5 4.0 - 10.5 K/uL   RBC 4.32 3.87 - 5.11 MIL/uL   Hemoglobin 12.6 12.0 - 15.0 g/dL   HCT 38.5 36.0 - 46.0 %   MCV 89.1 80.0 - 100.0 fL   MCH 29.2 26.0 - 34.0 pg   MCHC 32.7 30.0 - 36.0 g/dL   RDW 13.5 11.5 - 15.5 %   Platelets 304 150 - 400 K/uL   nRBC 0.0 0.0 - 0.2 %    Comment: Performed at [facility], [place] [address] [place], [place] [address]  Basic metabolic panel     Status: Abnormal   Collection Time: 04/09/22  7:40 AM  Result Value Ref Range   Sodium 132 (L) 135 - 145 mmol/L   Potassium 4.1 3.5 - 5.1 mmol/L   Chloride 99 98 - 111 mmol/L   CO2 22 22 - 32 mmol/L   Glucose, Bld 151 (H) 70 - 99 mg/dL    Comment: Glucose reference range applies only to samples taken after fasting for at least 8 hours.   BUN 29 (H) 6 - 20 mg/dL   Creatinine, Ser 1.84 (H) 0.44 - 1.00 mg/dL   Calcium 8.7 (L) 8.9 - 10.3 mg/dL   GFR, Estimated 31 (L) >60 mL/min    Comment: (NOTE) Calculated using the CKD-EPI Creatinine Equation (2021)    Anion gap 11 5 - 15    Comment: Performed at [facility] [address] [place], [place] [address]   Troponin I (High Sensitivity)     Status: None   Collection Time: 04/09/22  7:40 AM  Result Value Ref Range   Troponin I (High Sensitivity) 9 <18 ng/L    Comment: (NOTE) Elevated high sensitivity troponin I (hsTnI) values and significant  changes across serial measurements may suggest ACS but many other  chronic and acute conditions are known to elevate hsTnI results.  Refer to the "Links" section for chest pain algorithms and additional  guidance. Performed at [facility], [place] [address], [place], [place] [address]   D-dimer, quantitative     Status: Abnormal   Collection Time: 04/09/22  9:28 AM  Result Value Ref Range   D-Dimer, Quant 1.87 (H) 0.00 - 0.50 ug/mL-FEU    Comment: (NOTE) At the manufacturer cut-off value of 0.5 g/mL FEU, this assay has a negative predictive value of 95-100%.This assay is intended for use in conjunction with a clinical pretest probability (PTP) assessment model to exclude pulmonary embolism (PE) and deep venous thrombosis (DVT) in outpatients suspected of PE or DVT. Results should be correlated with clinical presentation. Performed at [facility], [place] [address] [place], [place] [address]   Troponin I (High Sensitivity)     Status: None   Collection Time: 04/09/22  9:28 AM  Result Value Ref Range   Troponin I (High Sensitivity) 8 <18 ng/L    Comment: (NOTE) Elevated high sensitivity troponin I (hsTnI) values and significant  changes across serial measurements may suggest ACS but many other  chronic and acute conditions are known to elevate hsTnI results.  Refer to the "Links" section for chest pain algorithms and additional  guidance. Performed at [facility], [place] [address] [place], [place] [address]   Glucose, capillary     Status: Abnormal   Collection Time: 04/09/22 12:10 PM  Result Value Ref Range   Glucose-Capillary 142 (H) 70 - 99 mg/dL    Comment: Glucose reference range applies only to samples taken after  fasting for at least 8 hours.   VAS [place] ABI WITH/WO TBI  Result Date: 04/09/2022  LOWER EXTREMITY DOPPLER STUDY Patient Name:  Erin Jackson  Date of Exam:   04/08/2022 Medical Rec #: [id number]       Accession #:    [id number] Date of Birth: 25-Jun-1966       Patient Gender: F Patient Age:   33 years Exam Location:  [facility] Procedure:      VAS [place] ABI WITH/WO TBI Referring Phys: Aldona Bar RHYNE --------------------------------------------------------------------------------  Indications: Femoral artery occlusion, left. High Risk Factors: Hypertension, Diabetes.  Comparison Study: no prior Performing Technologist: Archie Patten RVS  Examination Guidelines: A complete evaluation includes at minimum, Doppler waveform signals and systolic blood pressure reading at the level of bilateral brachial, anterior tibial, and posterior tibial arteries, when vessel segments are accessible. Bilateral testing is considered an integral part of a complete examination. Photoelectric Plethysmograph (PPG) waveforms and toe systolic pressure readings are included as required and additional duplex testing as needed. Limited examinations for reoccurring indications may be performed as noted.  ABI Findings: +---------+------------------+-----+---------+--------+ Right    Rt Pressure (mmHg)IndexWaveform Comment  +---------+------------------+-----+---------+--------+ Brachial 155                    triphasic         +---------+------------------+-----+---------+--------+ PTA      121               0.74 biphasic          +---------+------------------+-----+---------+--------+ DP       151               0.93 triphasic         +---------+------------------+-----+---------+--------+ Theodoro Parma               0.69 Abnormal          +---------+------------------+-----+---------+--------+ +---------+------------------+-----+-------------------+-----------------------+ Left     Lt Pressure  (mmHg)IndexWaveform  Comment                 +---------+------------------+-----+-------------------+-----------------------+ Brachial 163                    triphasic                                  +---------+------------------+-----+-------------------+-----------------------+ PTA      66                0.40 dampened monophasic                        +---------+------------------+-----+-------------------+-----------------------+ DP       64                0.39 dampened monophasic                        +---------+------------------+-----+-------------------+-----------------------+ Great Toe                                          unable to obtain                                                           pressure due to                                                            decreased great toe                                                        amplitude               +---------+------------------+-----+-------------------+-----------------------+ +-------+-----------+-----------+------------+------------+ ABI/TBIToday's ABIToday's TBIPrevious ABIPrevious TBI +-------+-----------+-----------+------------+------------+ Right  0.93       0.69                                +-------+-----------+-----------+------------+------------+ Left   0.40                                           +-------+-----------+-----------+------------+------------+   Summary: Right: Resting right ankle-brachial index indicates mild right lower extremity arterial disease. The right toe-brachial index is abnormal. Left: Resting left ankle-brachial index indicates severe left lower extremity arterial disease. *See table(s) above for measurements and observations.  Electronically signed by Jamelle Haring on 04/09/2022 at 7:26:35 AM.    Final    ECHOCARDIOGRAM COMPLETE  Result Date: 04/08/2022    ECHOCARDIOGRAM REPORT   Patient Name:   Erin Jackson Date of  Exam: 04/08/2022 Medical Rec #:  [id number]  Height:       62.5 in Accession #:    [id number]     Weight:       207.9 lb Date of Birth:  18-Jul-1966      BSA:          1.955 m Patient Age:    73 years       BP:           198/108 mmHg Patient Gender: F              HR:           96 bpm. Exam Location:  Inpatient Procedure: 2D Echo, Cardiac Doppler and Color Doppler Indications:    R07.9* Chest pain, unspecified  History:        Patient has no prior history of Echocardiogram examinations.                 Risk Factors:Hypertension and Diabetes.  Sonographer:    Bernadene Person RDCS Referring Phys: [place]  1. Inferior Septal / apical hypokinesis . Left ventricular ejection fraction, by estimation, is 35 to 40%. The left ventricle has moderately decreased function. The left ventricle demonstrates regional wall motion abnormalities (see scoring diagram/findings for description). The left ventricular internal cavity size was mildly dilated. Left ventricular diastolic parameters were normal.  2. Right ventricular systolic function is normal. The right ventricular size is normal.  3. A small pericardial effusion is present. The pericardial effusion is posterior to the left ventricle and anterior to the right ventricle.  4. The mitral valve is abnormal. Trivial mitral valve regurgitation. No evidence of mitral stenosis.  5. The aortic valve was not well visualized. There is mild calcification of the aortic valve. There is mild thickening of the aortic valve. Aortic valve regurgitation is trivial. Aortic valve sclerosis is present, with no evidence of aortic valve stenosis.  6. The inferior vena cava is normal in size with greater than 50% respiratory variability, suggesting right atrial pressure of 3 mmHg. FINDINGS  Left Ventricle: Inferior Septal / apical hypokinesis. Left ventricular ejection fraction, by estimation, is 35 to 40%. The left ventricle has moderately decreased function. The left  ventricle demonstrates regional wall motion abnormalities. The left ventricular internal cavity size was mildly dilated. There is no left ventricular hypertrophy. Left ventricular diastolic parameters were normal. Right Ventricle: The right ventricular size is normal. No increase in right ventricular wall thickness. Right ventricular systolic function is normal. Left Atrium: Left atrial size was normal in size. Right Atrium: Right atrial size was normal in size. Pericardium: A small pericardial effusion is present. The pericardial effusion is posterior to the left ventricle and anterior to the right ventricle. Mitral Valve: The mitral valve is abnormal. There is mild thickening of the mitral valve leaflet(s). There is mild calcification of the mitral valve leaflet(s). Mild mitral annular calcification. Trivial mitral valve regurgitation. No evidence of mitral valve stenosis. Tricuspid Valve: The tricuspid valve is normal in structure. Tricuspid valve regurgitation is trivial. No evidence of tricuspid stenosis. Aortic Valve: The aortic valve was not well visualized. There is mild calcification of the aortic valve. There is mild thickening of the aortic valve. Aortic valve regurgitation is trivial. Aortic regurgitation PHT measures 481 msec. Aortic valve sclerosis is present, with no evidence of aortic valve stenosis. Pulmonic Valve: The pulmonic valve was normal in structure. Pulmonic valve regurgitation is not visualized. No evidence of pulmonic stenosis. Aorta: The aortic root is normal in size and  structure. Venous: The inferior vena cava is normal in size with greater than 50% respiratory variability, suggesting right atrial pressure of 3 mmHg. IAS/Shunts: No atrial level shunt detected by color flow Doppler.  LEFT VENTRICLE PLAX 2D LVIDd:         4.70 cm      Diastology LVIDs:         3.90 cm      LV e' medial:    5.93 cm/s LV PW:         1.10 cm      LV E/e' medial:  12.5 LV IVS:        1.00 cm      LV e'  lateral:   6.68 cm/s LVOT diam:     2.00 cm      LV E/e' lateral: 11.1 LV SV:         60 LV SV Index:   31 LVOT Area:     3.14 cm  LV Volumes (MOD) LV vol d, MOD A2C: 126.0 ml LV vol d, MOD A4C: 93.4 ml LV vol s, MOD A2C: 75.2 ml LV vol s, MOD A4C: 48.9 ml LV SV MOD A2C:     50.8 ml LV SV MOD A4C:     93.4 ml LV SV MOD BP:      46.4 ml RIGHT VENTRICLE RV S prime:     11.90 cm/s TAPSE (M-mode): 1.7 cm LEFT ATRIUM             Index        RIGHT ATRIUM          Index LA diam:        2.70 cm 1.38 cm/m   RA Area:     9.28 cm LA Vol (A2C):   38.6 ml 19.74 ml/m  RA Volume:   17.80 ml 9.10 ml/m LA Vol (A4C):   31.5 ml 16.11 ml/m LA Biplane Vol: 36.0 ml 18.41 ml/m  AORTIC VALVE LVOT Vmax:         120.00 cm/s LVOT Vmean:        79.900 cm/s LVOT VTI:          0.190 m AI PHT:            481 msec AR Vena Contracta: 0.20 cm  AORTA Ao Root diam: 3.20 cm Ao Asc diam:  3.60 cm MITRAL VALVE MV Area (PHT): 5.13 cm    SHUNTS MV Decel Time: 148 msec    Systemic VTI:  0.19 m MV E velocity: 74.10 cm/s  Systemic Diam: 2.00 cm MV A velocity: 61.30 cm/s MV E/A ratio:  1.21 Jenkins Rouge MD Electronically signed by Jenkins Rouge MD Signature Date/Time: 04/08/2022/1:51:06 PM    Final    CT Angio Chest/Abd/Pel for Dissection W and/or Wo Contrast  Result Date: 04/08/2022 CLINICAL DATA:  Chest pain and back pain. EXAM: CT ANGIOGRAPHY CHEST, ABDOMEN AND PELVIS TECHNIQUE: Non-contrast CT of the chest was initially obtained. Multidetector CT imaging through the chest, abdomen and pelvis was performed using the standard protocol during bolus administration of intravenous contrast. Multiplanar reconstructed images and MIPs were obtained and reviewed to evaluate the vascular anatomy. RADIATION DOSE REDUCTION: This exam was performed according to the departmental dose-optimization program which includes automated exposure control, adjustment of the mA and/or kV according to patient size and/or use of iterative reconstruction technique.  CONTRAST:  169m OMNIPAQUE IOHEXOL 350 MG/ML SOLN COMPARISON:  None. FINDINGS: CTA CHEST FINDINGS Cardiovascular: Preferential opacification of the thoracic  aorta. No evidence of thoracic aortic aneurysm or dissection. Normal heart size. There is a small pericardial effusion. Mediastinum/Nodes: No enlarged mediastinal, hilar, or axillary lymph nodes. Thyroid gland, trachea, and esophagus demonstrate no significant findings. Lungs/Pleura: There is minimal atelectasis in the inferior left upper lobe. The lungs are otherwise clear. No pleural effusion or pneumothorax identified. Musculoskeletal: No acute fractures are identified. There is an ill-defined low attenuation collection in the left breast measuring approximately 4.0 x 7.4 x 3.0 cm. There is no surrounding enhancement. Review of the MIP images confirms the above findings. CTA ABDOMEN AND PELVIS FINDINGS VASCULAR Aorta: Normal caliber aorta without aneurysm, dissection, vasculitis or significant stenosis. There is severe atherosclerotic disease throughout the aorta. There ulcerated plaques in the proximal abdominal aorta. There is also calcified atherosclerotic disease. Celiac: Patent without evidence of aneurysm, dissection, vasculitis or significant stenosis. SMA: Patent without evidence of aneurysm, dissection, vasculitis or significant stenosis. Renals: Severe focal stenosis origin right renal artery. Left renal artery within normal limits. IMA: Not seen. Inflow: Patent without evidence of aneurysm, dissection, vasculitis or significant stenosis. There is occlusion of the visualized proximal left femoral artery. Veins: No obvious venous abnormality within the limitations of this arterial phase study. Review of the MIP images confirms the above findings. NON-VASCULAR Hepatobiliary: No focal liver abnormality is seen. No gallstones, gallbladder wall thickening, or biliary dilatation. Pancreas: Unremarkable. No pancreatic ductal dilatation or surrounding  inflammatory changes. Spleen: Normal in size without focal abnormality. Adrenals/Urinary Tract: There is some patchy areas of cortical hypodensity in the superior pole the right kidney. There is no hydronephrosis or perinephric fluid. Adrenal glands and bladder are within normal limits. Stomach/Bowel: Stomach is within normal limits. Appendix appears normal. No evidence of bowel wall thickening, distention, or inflammatory changes. Lymphatic: No enlarged lymph nodes are seen. Reproductive: Status post hysterectomy. No adnexal masses. Other: No abdominal wall hernia or abnormality. No abdominopelvic ascites. Musculoskeletal: No acute or significant osseous findings. Review of the MIP images confirms the above findings. IMPRESSION: 1. No evidence for aortic dissection or aneurysm. 2. Severe atherosclerotic disease of the abdominal aorta with noncalcified plaque and ulcerated plaque. 3. 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 4. Severe focal stenosis origin of the right renal artery. 5. IMA not visualized. 6. Patchy hypodensity superior pole the right kidney, indeterminate. Findings may represent pyelonephritis or infarct. Other focal lesion can not be excluded. Consider follow-up evaluation with dedicated renal CT or MRI. 7. Small pericardial effusion. 8. Low-density collection in the left breast, indeterminate. Findings may be related to fluid collection or resolving hematoma. Recommend clinical correlation. Consider ultrasound. Electronically Signed   By: Ronney Asters M.D.   On: 04/08/2022 00:53    Review Of Systems Constitutional: No fever, chills, weight loss or gain. Eyes: No vision change, wears glasses. No discharge or pain. Ears: No hearing loss, No tinnitus. Respiratory: No asthma, Positive COPD, pneumonias,  shortness of breath. No hemoptysis. Cardiovascular: Positive chest pain, palpitation, leg edema. Gastrointestinal: No nausea, vomiting, diarrhea, constipation. No GI bleed. No  hepatitis. Genitourinary: No dysuria, hematuria, kidney stone. No incontinance. Neurological: No headache, stroke, seizures.  Psychiatry: No psych facility admission for anxiety, depression, suicide. No detox. Skin: No rash. Musculoskeletal: Positive joint pain, fibromyalgia, neck pain, back pain. Lymphadenopathy: No lymphadenopathy. Hematology: No anemia or easy bruising.   Blood pressure 132/85, pulse 77, temperature 97.6 F (36.4 C), temperature source Oral, resp. rate 14, height 5' 2" (1.575 m), weight 94.6 kg, SpO2 97 %. Body mass index  is 38.15 kg/m. General appearance: alert, cooperative, appears stated age and no distress Head: Normocephalic, atraumatic. Eyes: Brown eyes, pink conjunctiva, corneas clear. PERRL, EOM's intact. Neck: No adenopathy, no carotid bruit, no JVD, supple, symmetrical, trachea midline and thyroid not enlarged. Resp: Clear to auscultation bilaterally. Cardio: Regular rate and rhythm, S1, S2 normal, II/VI systolic murmur, no click, rub or gallop GI: Soft, non-tender; bowel sounds normal; no organomegaly. Extremities: No edema, cyanosis or clubbing. Skin: Warm and dry.  Neurologic: Alert and oriented X 2, normal strength. Normal coordination..  Assessment/Plan Chest pain Acute systolic left heart failure HTN Type 2 DM PVD Tobacco use disorder CKD, II  Plan: Agree with r/o MI. Patient is high risk for CAD. May benefit from cardiac catheterization. Discussed with son who has power of attorney.   Time spent: Review of old records, Lab, x-rays, EKG, other cardiac tests, examination, discussion with patient/Family/Doctor over 70 minutes.  Birdie Riddle, MD  04/09/2022, 1:11 PM

## 2022-04-09 NOTE — Progress Notes (Signed)
PROGRESS NOTE    Erin Jackson  QMV:[id number] DOB: 1966/04/16 DOA: 04/07/2022 PCP: Erin Jackson., FNP    Chief Complaint  Patient presents with   Chest Pain    Brief Narrative:    Erin Jackson is a 56 y.o. female with history of hypertension, diabetes mellitus, anxiety, hyperlipidemia presents to the ER after patient has been having persistent itching over the last 2 days for which patient took 2 doses of epinephrine shots and also 6 tablets of Benadryl.  He was admitted for further work-up, her work-up including CTA chest/abdomen/pelvis with no evidence of dissection, but significant for significant atherosclerotic disease and severe PVD . -In morning of 4/20 patient complaining of severe chest pain, resolved with nitro, cardiology were consulted.    Assessment & Plan:   Principal Problem:   Allergic reaction Active Problems:   Hypertension associated with diabetes (HCC)   T2DM (type 2 diabetes mellitus) (HCC)   Chest pain  Pain with concern of unstable angina -This morning she is complaining of severe chest pain, resolved with nitro, cardiology input greatly appreciated, concern for unstable angina, EKG and troponins are reassuring. -As discussed with Dr. Algie Coffer continue with heparin GTT for now, she has allergy to aspirin, so we will avoid, will start on metoprolol, will increase her statin and continue with heparin GTT. -Cardiac cath timing per cardiology. -2D echo with inferior septal/apical hypokinesis  Acute systolic CHF -2D echo significant for EF 35 to 40%, with inferior septal/apical hypokinesis. -Per cardiology, started on metoprolol.  Allergic reaction cause not clear.  -  Patient states she at times gets frequent allergies last 1 was in December when patient was given EpiPen. -Initially on IV Solu-Medrol and IV Pepcid, rash and itching has improved, transition to oral prednisone.  Sinus tachycardia  - likely from using EpiPen  -Improving she received  IV fluids and on metoprolol .  Left femoral artery occlusion/PVD -Vascular surgery input greatly appreciated, no plan for intervention currently, recommendation for outpatient follow-up. -Continue with optimization of risk factors, [place] acceptable at 6.5, LDL is elevated will increase her Lipitor . -Blood pressure medicine has been adjusted .  Hypertension  -Uncontrolled on clonidine, started on metoprolol .  Diabetes mellitus type 2 -Controlled, continue to hold metformin and continue with insulin sliding scale.  Tobacco abuse: -  She was counseled -Nicotine patch.  Chronic pain syndrome -Sleeping most of the day and somnolent, I have decreased her Xanax, as well I have discontinued her scheduled long-acting narcotics and started in low dose Equidone as needed.   DVT prophylaxis: Heparin Code Status: Full Family Communication: Cussed with son at bedside, as well another son Erin Jackson by phone Disposition:   Status is: Inpatient    Consultants:  Vascular surgery Cardiology   Subjective:  Complains of chest pain this morning resolved by nitro  Objective: Vitals:   04/09/22 0738 04/09/22 0944 04/09/22 1200 04/09/22 1208  BP: (!) 204/118 125/84 132/85 132/85  Pulse:  88  77  Resp: Temp:    97.6 F (36.4 C)  TempSrc:    Oral  SpO2:  97%  97%  Weight:      Height:        Intake/Output Summary (Last 24 hours) at 04/09/2022 1218 Last data filed at 04/09/2022 0600 Gross per 24 hour  Intake 340 ml  Output --  Net 340 ml    Filed Weights   04/08/22 2000  Weight: 94.6 kg  Examination:  Awake Alert, Oriented X 3, No new F.N deficits, Normal affect Symmetrical Chest wall movement, Good air movement bilaterally, CTAB RRR,No Gallops,Rubs or new Murmurs, No Parasternal Heave +ve B.Sounds, Abd Soft, No tenderness, No rebound - guarding or rigidity. No Cyanosis, Clubbing or edema, No new Rash or bruise        Data Reviewed: I have personally reviewed  following labs and imaging studies  CBC: Recent Labs  Lab 04/07/22 2229 04/08/22 0623 04/09/22 0740  WBC 10.3 7.7 8.5  NEUTROABS  --  6.0  --   HGB 13.5 13.2 12.6  HCT 41.6 41.0 38.5  MCV 91.0 90.9 89.1  PLT 315 278 304     Basic Metabolic Panel: Recent Labs  Lab 04/07/22 2229 04/08/22 0623 04/09/22 0740  NA 136 131* 132*  K 4.0 4.6 4.1  CL 101 99 99  CO2 24 20* 22  GLUCOSE 152* 236* 151*  BUN 11 14 29*  CREATININE 1.25* 1.18* 1.84*  CALCIUM 9.5 9.2 8.7*     GFR: Estimated Creatinine Clearance: 35.7 mL/min (A) (by C-G formula based on SCr of 1.84 mg/dL (H)).  Liver Function Tests: Recent Labs  Lab 04/08/22 0623  AST 16  ALT 10  ALKPHOS 65  BILITOT 0.6  PROT 7.3  ALBUMIN 3.1*     CBG: Recent Labs  Lab 04/08/22 0737 04/08/22 1216 04/08/22 1750 04/08/22 2119 04/09/22 1210  GLUCAP 251* 199* 164* 267* 142*      Recent Results (from the past 240 hour(s))  Culture, blood (Routine X 2) w Reflex to ID Panel     Status: None (Preliminary result)   Collection Time: 04/08/22  7:52 AM   Specimen: BLOOD  Result Value Ref Range Status   Specimen Description BLOOD RIGHT ANTECUBITAL  Final   Special Requests   Final    BOTTLES DRAWN AEROBIC AND ANAEROBIC Blood Culture adequate volume   Culture   Final    NO GROWTH < 24 HOURS Performed at [facility], [address], [place], [place] [address]    Report Status PENDING  Incomplete  Culture, blood (Routine X 2) w Reflex to ID Panel     Status: None (Preliminary result)   Collection Time: 04/08/22  7:52 AM   Specimen: BLOOD  Result Value Ref Range Status   Specimen Description BLOOD BLOOD RIGHT FOREARM  Final   Special Requests   Final    BOTTLES DRAWN AEROBIC AND ANAEROBIC Blood Culture adequate volume   Culture   Final    NO GROWTH < 24 HOURS Performed at [facility], [address] [place], [place] [address]    Report Status PENDING  Incomplete         Radiology Studies: VAS  [place] ABI WITH/WO TBI  Result Date: 04/09/2022  LOWER EXTREMITY DOPPLER STUDY Patient Name:  Erin Jackson  Date of Exam:   04/08/2022 Medical Rec #: [id number]       Accession #:    [id number] Date of Birth: 18-Jul-1966       Patient Gender: F Patient Age:   74 years Exam Location:  [facility] Procedure:      VAS [place] ABI WITH/WO TBI Referring Phys: Lelon Mast RHYNE --------------------------------------------------------------------------------  Indications: Femoral artery occlusion, left. High Risk Factors: Hypertension, Diabetes.  Comparison Study: no prior Performing Technologist: [place] Ponder RVS  Examination Guidelines: A complete evaluation includes at minimum, Doppler waveform signals and systolic blood pressure reading at the level of bilateral  brachial, anterior tibial, and posterior tibial arteries, when vessel segments are accessible. Bilateral testing is considered an integral part of a complete examination. Photoelectric Plethysmograph (PPG) waveforms and toe systolic pressure readings are included as required and additional duplex testing as needed. Limited examinations for reoccurring indications may be performed as noted.  ABI Findings: +---------+------------------+-----+---------+--------+ Right    Rt Pressure (mmHg)IndexWaveform Comment  +---------+------------------+-----+---------+--------+ Brachial 155                    triphasic         +---------+------------------+-----+---------+--------+ PTA      121               0.74 biphasic          +---------+------------------+-----+---------+--------+ DP       151               0.93 triphasic         +---------+------------------+-----+---------+--------+ Morton Amy               0.69 Abnormal          +---------+------------------+-----+---------+--------+ +---------+------------------+-----+-------------------+-----------------------+ Left     Lt Pressure (mmHg)IndexWaveform           Comment                  +---------+------------------+-----+-------------------+-----------------------+ Brachial 163                    triphasic                                  +---------+------------------+-----+-------------------+-----------------------+ PTA      66                0.40 dampened monophasic                        +---------+------------------+-----+-------------------+-----------------------+ DP       64                0.39 dampened monophasic                        +---------+------------------+-----+-------------------+-----------------------+ Great Toe                                          unable to obtain                                                           pressure due to                                                            decreased great toe  amplitude               +---------+------------------+-----+-------------------+-----------------------+ +-------+-----------+-----------+------------+------------+ ABI/TBIToday's ABIToday's TBIPrevious ABIPrevious TBI +-------+-----------+-----------+------------+------------+ Right  0.93       0.69                                +-------+-----------+-----------+------------+------------+ Left   0.40                                           +-------+-----------+-----------+------------+------------+   Summary: Right: Resting right ankle-brachial index indicates mild right lower extremity arterial disease. The right toe-brachial index is abnormal. Left: Resting left ankle-brachial index indicates severe left lower extremity arterial disease. *See table(s) above for measurements and observations.  Electronically signed by Heath Lark on 04/09/2022 at 7:26:35 AM.    Final    ECHOCARDIOGRAM COMPLETE  Result Date: 04/08/2022    ECHOCARDIOGRAM REPORT   Patient Name:   Erin Jackson Date of Exam: 04/08/2022 Medical Rec #:  [id number]       Height:       62.5 in Accession #:    [id number]     Weight:       207.9 lb Date of Birth:  03/01/1966      BSA:          1.955 m Patient Age:    56 years       BP:           198/108 mmHg Patient Gender: F              HR:           96 bpm. Exam Location:  Inpatient Procedure: 2D Echo, Cardiac Doppler and Color Doppler Indications:    R07.9* Chest pain, unspecified  History:        Patient has no prior history of Echocardiogram examinations.                 Risk Factors:Hypertension and Diabetes.  Sonographer:    Eulah Pont RDCS Referring Phys: [phone] [facility] IMPRESSIONS  1. Inferior Septal / apical hypokinesis . Left ventricular ejection fraction, by estimation, is 35 to 40%. The left ventricle has moderately decreased function. The left ventricle demonstrates regional wall motion abnormalities (see scoring diagram/findings for description). The left ventricular internal cavity size was mildly dilated. Left ventricular diastolic parameters were normal.  2. Right ventricular systolic function is normal. The right ventricular size is normal.  3. A small pericardial effusion is present. The pericardial effusion is posterior to the left ventricle and anterior to the right ventricle.  4. The mitral valve is abnormal. Trivial mitral valve regurgitation. No evidence of mitral stenosis.  5. The aortic valve was not well visualized. There is mild calcification of the aortic valve. There is mild thickening of the aortic valve. Aortic valve regurgitation is trivial. Aortic valve sclerosis is present, with no evidence of aortic valve stenosis.  6. The inferior vena cava is normal in size with greater than 50% respiratory variability, suggesting right atrial pressure of 3 mmHg. FINDINGS  Left Ventricle: Inferior Septal / apical hypokinesis. Left ventricular ejection fraction, by estimation, is 35 to 40%. The left ventricle has moderately decreased function. The left ventricle demonstrates regional wall motion  abnormalities. The left ventricular internal cavity size was mildly dilated. There is no  left ventricular hypertrophy. Left ventricular diastolic parameters were normal. Right Ventricle: The right ventricular size is normal. No increase in right ventricular wall thickness. Right ventricular systolic function is normal. Left Atrium: Left atrial size was normal in size. Right Atrium: Right atrial size was normal in size. Pericardium: A small pericardial effusion is present. The pericardial effusion is posterior to the left ventricle and anterior to the right ventricle. Mitral Valve: The mitral valve is abnormal. There is mild thickening of the mitral valve leaflet(s). There is mild calcification of the mitral valve leaflet(s). Mild mitral annular calcification. Trivial mitral valve regurgitation. No evidence of mitral valve stenosis. Tricuspid Valve: The tricuspid valve is normal in structure. Tricuspid valve regurgitation is trivial. No evidence of tricuspid stenosis. Aortic Valve: The aortic valve was not well visualized. There is mild calcification of the aortic valve. There is mild thickening of the aortic valve. Aortic valve regurgitation is trivial. Aortic regurgitation PHT measures 481 msec. Aortic valve sclerosis is present, with no evidence of aortic valve stenosis. Pulmonic Valve: The pulmonic valve was normal in structure. Pulmonic valve regurgitation is not visualized. No evidence of pulmonic stenosis. Aorta: The aortic root is normal in size and structure. Venous: The inferior vena cava is normal in size with greater than 50% respiratory variability, suggesting right atrial pressure of 3 mmHg. IAS/Shunts: No atrial level shunt detected by color flow Doppler.  LEFT VENTRICLE PLAX 2D LVIDd:         4.70 cm      Diastology LVIDs:         3.90 cm      LV e' medial:    5.93 cm/s LV PW:         1.10 cm      LV E/e' medial:  12.5 LV IVS:        1.00 cm      LV e' lateral:   6.68 cm/s LVOT diam:     2.00 cm       LV E/e' lateral: 11.1 LV SV:         60 LV SV Index:   31 LVOT Area:     3.14 cm  LV Volumes (MOD) LV vol d, MOD A2C: 126.0 ml LV vol d, MOD A4C: 93.4 ml LV vol s, MOD A2C: 75.2 ml LV vol s, MOD A4C: 48.9 ml LV SV MOD A2C:     50.8 ml LV SV MOD A4C:     93.4 ml LV SV MOD BP:      46.4 ml RIGHT VENTRICLE RV S prime:     11.90 cm/s TAPSE (M-mode): 1.7 cm LEFT ATRIUM             Index        RIGHT ATRIUM          Index LA diam:        2.70 cm 1.38 cm/m   RA Area:     9.28 cm LA Vol (A2C):   38.6 ml 19.74 ml/m  RA Volume:   17.80 ml 9.10 ml/m LA Vol (A4C):   31.5 ml 16.11 ml/m LA Biplane Vol: 36.0 ml 18.41 ml/m  AORTIC VALVE LVOT Vmax:         120.00 cm/s LVOT Vmean:        79.900 cm/s LVOT VTI:          0.190 m AI PHT:            481 msec AR Vena Contracta: 0.20 cm  AORTA Ao Root diam: 3.20 cm Ao Asc diam:  3.60 cm MITRAL VALVE MV Area (PHT): 5.13 cm    SHUNTS MV Decel Time: 148 msec    Systemic VTI:  0.19 m MV E velocity: 74.10 cm/s  Systemic Diam: 2.00 cm MV A velocity: 61.30 cm/s MV E/A ratio:  1.21 Charlton HawsPeter Nishan MD Electronically signed by Charlton HawsPeter Nishan MD Signature Date/Time: 04/08/2022/1:51:06 PM    Final    CT Angio Chest/Abd/Pel for Dissection W and/or Wo Contrast  Result Date: 04/08/2022 CLINICAL DATA:  Chest pain and back pain. EXAM: CT ANGIOGRAPHY CHEST, ABDOMEN AND PELVIS TECHNIQUE: Non-contrast CT of the chest was initially obtained. Multidetector CT imaging through the chest, abdomen and pelvis was performed using the standard protocol during bolus administration of intravenous contrast. Multiplanar reconstructed images and MIPs were obtained and reviewed to evaluate the vascular anatomy. RADIATION DOSE REDUCTION: This exam was performed according to the departmental dose-optimization program which includes automated exposure control, adjustment of the mA and/or kV according to patient size and/or use of iterative reconstruction technique. CONTRAST:  100mL OMNIPAQUE IOHEXOL 350 MG/ML SOLN  COMPARISON:  None. FINDINGS: CTA CHEST FINDINGS Cardiovascular: Preferential opacification of the thoracic aorta. No evidence of thoracic aortic aneurysm or dissection. Normal heart size. There is a small pericardial effusion. Mediastinum/Nodes: No enlarged mediastinal, hilar, or axillary lymph nodes. Thyroid gland, trachea, and esophagus demonstrate no significant findings. Lungs/Pleura: There is minimal atelectasis in the inferior left upper lobe. The lungs are otherwise clear. No pleural effusion or pneumothorax identified. Musculoskeletal: No acute fractures are identified. There is an ill-defined low attenuation collection in the left breast measuring approximately 4.0 x 7.4 x 3.0 cm. There is no surrounding enhancement. Review of the MIP images confirms the above findings. CTA ABDOMEN AND PELVIS FINDINGS VASCULAR Aorta: Normal caliber aorta without aneurysm, dissection, vasculitis or significant stenosis. There is severe atherosclerotic disease throughout the aorta. There ulcerated plaques in the proximal abdominal aorta. There is also calcified atherosclerotic disease. Celiac: Patent without evidence of aneurysm, dissection, vasculitis or significant stenosis. SMA: Patent without evidence of aneurysm, dissection, vasculitis or significant stenosis. Renals: Severe focal stenosis origin right renal artery. Left renal artery within normal limits. IMA: Not seen. Inflow: Patent without evidence of aneurysm, dissection, vasculitis or significant stenosis. There is occlusion of the visualized proximal left femoral artery. Veins: No obvious venous abnormality within the limitations of this arterial phase study. Review of the MIP images confirms the above findings. NON-VASCULAR Hepatobiliary: No focal liver abnormality is seen. No gallstones, gallbladder wall thickening, or biliary dilatation. Pancreas: Unremarkable. No pancreatic ductal dilatation or surrounding inflammatory changes. Spleen: Normal in size without  focal abnormality. Adrenals/Urinary Tract: There is some patchy areas of cortical hypodensity in the superior pole the right kidney. There is no hydronephrosis or perinephric fluid. Adrenal glands and bladder are within normal limits. Stomach/Bowel: Stomach is within normal limits. Appendix appears normal. No evidence of bowel wall thickening, distention, or inflammatory changes. Lymphatic: No enlarged lymph nodes are seen. Reproductive: Status post hysterectomy. No adnexal masses. Other: No abdominal wall hernia or abnormality. No abdominopelvic ascites. Musculoskeletal: No acute or significant osseous findings. Review of the MIP images confirms the above findings. IMPRESSION: 1. No evidence for aortic dissection or aneurysm. 2. Severe atherosclerotic disease of the abdominal aorta with noncalcified plaque and ulcerated plaque. 3. 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 4. Severe focal stenosis origin of the right renal artery. 5. IMA not visualized. 6. Patchy hypodensity superior pole the right  kidney, indeterminate. Findings may represent pyelonephritis or infarct. Other focal lesion can not be excluded. Consider follow-up evaluation with dedicated renal CT or MRI. 7. Small pericardial effusion. 8. Low-density collection in the left breast, indeterminate. Findings may be related to fluid collection or resolving hematoma. Recommend clinical correlation. Consider ultrasound. Electronically Signed   By: Darliss Cheney M.D.   On: 04/08/2022 00:53        Scheduled Meds:  [START ON 04/10/2022] atorvastatin  80 mg Oral Daily   cloNIDine  0.4 mg Oral BID   insulin aspart  0-9 Units Subcutaneous TID WC   metoprolol tartrate  25 mg Oral BID   nicotine  14 mg Transdermal Daily   pantoprazole  40 mg Oral Daily   [START ON 04/10/2022] predniSONE  10 mg Oral Q breakfast   traZODone  150 mg Oral QHS   Continuous Infusions:  famotidine (PEPCID) IV 20 mg (04/09/22 0817)   heparin 850 Units/hr  (04/09/22 0953)   lactated ringers     nitroGLYCERIN Stopped (04/09/22 0943)     LOS: 1 day        Huey Bienenstock, MD [facility]   To contact the attending provider between 7A-7P or the covering provider during after hours 7P-7A, please log into the web site [email or web address] and access using universal [place] password for that web site. If you do not have the password, please call the hospital operator.  04/09/2022, 12:18 PM

## 2022-04-10 ENCOUNTER — Encounter (HOSPITAL_COMMUNITY): Payer: Self-pay | Admitting: Cardiovascular Disease

## 2022-04-10 ENCOUNTER — Other Ambulatory Visit (HOSPITAL_COMMUNITY): Payer: Self-pay

## 2022-04-10 DIAGNOSIS — I255 Ischemic cardiomyopathy: Secondary | ICD-10-CM

## 2022-04-10 DIAGNOSIS — I2510 Atherosclerotic heart disease of native coronary artery without angina pectoris: Secondary | ICD-10-CM

## 2022-04-10 DIAGNOSIS — T7840XA Allergy, unspecified, initial encounter: Secondary | ICD-10-CM | POA: Diagnosis not present

## 2022-04-10 DIAGNOSIS — I5021 Acute systolic (congestive) heart failure: Secondary | ICD-10-CM

## 2022-04-10 DIAGNOSIS — I739 Peripheral vascular disease, unspecified: Secondary | ICD-10-CM

## 2022-04-10 DIAGNOSIS — I208 Other forms of angina pectoris: Secondary | ICD-10-CM

## 2022-04-10 DIAGNOSIS — I2583 Coronary atherosclerosis due to lipid rich plaque: Secondary | ICD-10-CM

## 2022-04-10 LAB — BASIC METABOLIC PANEL
Anion gap: 7 (ref 5–15)
BUN: 34 mg/dL — ABNORMAL HIGH (ref 6–20)
CO2: 21 mmol/L — ABNORMAL LOW (ref 22–32)
Calcium: 8.5 mg/dL — ABNORMAL LOW (ref 8.9–10.3)
Chloride: 106 mmol/L (ref 98–111)
Creatinine, Ser: 1.81 mg/dL — ABNORMAL HIGH (ref 0.44–1.00)
GFR, Estimated: 32 mL/min — ABNORMAL LOW (ref >60)
Glucose, Bld: 208 mg/dL — ABNORMAL HIGH (ref 70–99)
Potassium: 5 mmol/L (ref 3.5–5.1)
Sodium: 134 mmol/L — ABNORMAL LOW (ref 135–145)

## 2022-04-10 LAB — GLUCOSE, CAPILLARY
Glucose-Capillary: 283 mg/dL — ABNORMAL HIGH (ref 70–99)
Glucose-Capillary: 176 mg/dL — ABNORMAL HIGH (ref 70–99)

## 2022-04-10 LAB — CBC
HCT: 36 % (ref 36.0–46.0)
Hemoglobin: 11.7 g/dL — ABNORMAL LOW (ref 12.0–15.0)
MCH: 29.3 pg (ref 26.0–34.0)
MCHC: 32.5 g/dL (ref 30.0–36.0)
MCV: 90 fL (ref 80.0–100.0)
Platelets: 266 10*3/uL (ref 150–400)
RBC: 4 MIL/uL (ref 3.87–5.11)
RDW: 13.6 % (ref 11.5–15.5)
WBC: 7.1 10*3/uL (ref 4.0–10.5)
nRBC: 0 % (ref 0.0–0.2)

## 2022-04-10 MED ORDER — ALUM & MAG HYDROXIDE-SIMETH 200-200-20 MG/5ML PO SUSP
30.0000 mL | Freq: Once | ORAL | Status: AC
  Administered 2022-04-10: 30 mL via ORAL
  Filled 2022-04-10: qty 30

## 2022-04-10 MED ORDER — CLONIDINE HCL 0.2 MG PO TABS: 0.2000 mg | ORAL_TABLET | Freq: Two times a day (BID) | ORAL | Status: DC

## 2022-04-10 MED ORDER — NITROGLYCERIN 0.4 MG SL SUBL
0.4000 mg | SUBLINGUAL_TABLET | SUBLINGUAL | 0 refills | Status: AC | PRN
  Filled 2022-04-10: qty 25, 14d supply, fill #0

## 2022-04-10 MED ORDER — ATORVASTATIN CALCIUM 80 MG PO TABS
80.0000 mg | ORAL_TABLET | Freq: Every day | ORAL | 0 refills | Status: DC
  Filled 2022-04-10: qty 30, 30d supply, fill #0

## 2022-04-10 MED ORDER — CLOPIDOGREL BISULFATE 75 MG PO TABS
75.0000 mg | ORAL_TABLET | Freq: Every day | ORAL | 0 refills | Status: DC
  Filled 2022-04-10: qty 30, 30d supply, fill #0

## 2022-04-10 MED ORDER — ALPRAZOLAM 2 MG PO TABS: 1.0000 mg | ORAL_TABLET | Freq: Two times a day (BID) | ORAL | 0 refills | Status: DC | PRN

## 2022-04-10 MED ORDER — NICOTINE 14 MG/24HR TD PT24: 14.0000 mg | MEDICATED_PATCH | Freq: Every day | TRANSDERMAL | 0 refills | Status: DC

## 2022-04-10 MED ORDER — PREDNISONE 10 MG (21) PO TBPK
ORAL_TABLET | ORAL | 0 refills | Status: DC
  Filled 2022-04-10: qty 21, 6d supply, fill #0

## 2022-04-10 MED ORDER — PANTOPRAZOLE SODIUM 40 MG PO TBEC
40.0000 mg | DELAYED_RELEASE_TABLET | Freq: Every day | ORAL | 0 refills | Status: DC
  Filled 2022-04-10: qty 30, 30d supply, fill #0

## 2022-04-10 MED ORDER — METOPROLOL TARTRATE 50 MG PO TABS
50.0000 mg | ORAL_TABLET | Freq: Two times a day (BID) | ORAL | 0 refills | Status: DC
  Filled 2022-04-10: qty 60, 30d supply, fill #0

## 2022-04-10 MED ORDER — METOPROLOL TARTRATE 50 MG PO TABS
50.0000 mg | ORAL_TABLET | Freq: Two times a day (BID) | ORAL | Status: DC
  Administered 2022-04-10: 50 mg via ORAL
  Filled 2022-04-10: qty 1

## 2022-04-10 MED ORDER — METFORMIN HCL 500 MG PO TABS: 500.0000 mg | ORAL_TABLET | Freq: Two times a day (BID) | ORAL | Status: DC

## 2022-04-10 MED ORDER — CLONIDINE HCL 0.2 MG PO TABS
0.2000 mg | ORAL_TABLET | Freq: Two times a day (BID) | ORAL | Status: DC
  Administered 2022-04-10: 0.2 mg via ORAL
  Filled 2022-04-10: qty 1

## 2022-04-10 MED ORDER — CLOPIDOGREL BISULFATE 75 MG PO TABS
75.0000 mg | ORAL_TABLET | Freq: Every day | ORAL | Status: DC
  Administered 2022-04-10: 75 mg via ORAL
  Filled 2022-04-10: qty 1

## 2022-04-10 MED ORDER — LOSARTAN POTASSIUM 25 MG PO TABS
25.0000 mg | ORAL_TABLET | Freq: Every day | ORAL | 0 refills | Status: DC
  Filled 2022-04-10: qty 30, 30d supply, fill #0

## 2022-04-10 MED ORDER — PREDNISONE 10 MG (21) PO TBPK: ORAL_TABLET | ORAL | 0 refills | Status: DC

## 2022-04-10 MED ORDER — LOSARTAN POTASSIUM 25 MG PO TABS
25.0000 mg | ORAL_TABLET | Freq: Every day | ORAL | Status: DC
  Administered 2022-04-10: 25 mg via ORAL
  Filled 2022-04-10: qty 1

## 2022-04-10 MED FILL — Verapamil HCl IV Soln 2.5 MG/ML: INTRAVENOUS | Qty: 2 | Status: AC

## 2022-04-10 NOTE — Discharge Summary (Signed)
Physician Discharge Summary  Erin Jackson JEH:[id number] DOB: 03-18-1966 DOA: 04/07/2022  PCP: Sonia Side., FNP  Admit date: 04/07/2022 Discharge date: 04/10/2022  Admitted From: Home Disposition:  Home   Recommendations for Outpatient Follow-up:  Follow up with PCP in 1-2 weeks Please continue counseling about tobacco cessation He is continue counseling about medication compliance and lifestyle    Discharge Condition:Stable CODE STATUS:FULL Diet recommendation: Heart Healthy/ Carb modified   Brief/Interim Summary:  Erin Jackson is a 56 y.o. female with history of hypertension, diabetes mellitus, anxiety, hyperlipidemia presents to the ER after patient has been having persistent itching over the last 2 days for which patient took 2 doses of epinephrine shots and also 6 tablets of Benadryl.  He was admitted for further work-up, her work-up including CTA chest/abdomen/pelvis with no evidence of dissection, but significant for significant atherosclerotic disease and severe PVD . -In morning of 4/20 patient complaining of severe chest pain, resolved with nitro, cardiology were consulted.  Concern were for unstable angina, her EKG was nonacute, and opponents were negative, she was kept on heparin drip for 24 hours, went for cardiac cath 4/20 which was significant for multivessel             Chest pain with concern of unstable angina -Patient with chest pain 4/20, at rest, resolved by nitro , resolved with nitro, cardiology input greatly appreciated, concern for unstable angina, EKG and troponins are reassuring. -She was seen by Dr. Doylene Canard, kept on heparin drip empirically, she went for cardiac cath 4/20, which was significant for multivessel nonobstructive CAD    Ost LAD to Prox LAD lesion is 50% stenosed.   Mid Cx lesion is 40% stenosed.   Dist Cx lesion is 50% stenosed Commendation for lifestyle modification, and medical management, she is allergic to aspirin so she was  started on Plavix instead, she is on high-dose statin, beta-blockers and losartan and as needed nitro. -2D echo with inferior septal/apical hypokinesis   Acute systolic CHF -2D echo significant for EF 35 to 40%, with inferior septal/apical hypokinesis. -Management per cardiology, she is started on metoprolol, losartan, Lasix and statin, Wilder Glade and Delene Loll can be considered as an outpatient per cardiology   Allergic reaction cause not clear.  -  Patient states she at times gets frequent allergies last 1 was in December when patient was given EpiPen. -He was treated with IV Solu-Medrol and IV Pepcid, she will be transitioned to prednisone taper on discharge   Sinus tachycardia  - likely from using EpiPen  -Improving she received IV fluids and on metoprolol .   Left femoral artery occlusion/PVD -Vascular surgery input greatly appreciated, no plan for intervention currently, recommendation for outpatient follow-up. -Continue with optimization of risk factors, [place] acceptable at 6.5, LDL is elevated will increase her Lipitor . -Blood pressure medicine has been adjusted .   Hypertension  -Uncontrolled on clonidine, she was started on metoprolol and losartan, her clonidine dose has been lowered as metoprolol has been uptitrated   Diabetes mellitus type 2 -Controlled, due metformin on discharge   Tobacco abuse: -  She was counseled -Nicotine patch.   Chronic pain syndrome -Sleeping most of the day and somnolent, I have decreased her Xanax, as well I have discontinued her scheduled long-acting narcotics during hospital stay.    Discharge Diagnoses:  Principal Problem:   Allergic reaction Active Problems:   Hypertension associated with diabetes ([place])   T2DM (type 2 diabetes mellitus) ([place])   Angina at rest (  [place])   CAD (coronary artery disease)   PVD (peripheral vascular disease) ([facility])   Ischemic cardiomyopathy   Acute systolic CHF (congestive heart failure) [facility])    Discharge  Instructions  Discharge Instructions     Diet - low sodium heart healthy   Complete by: As directed    Discharge instructions   Complete by: As directed    Follow with Primary MD Sonia Side., FNP in 7 days   Get CBC, CMP,  checked  by Primary MD next visit.    Activity: As tolerated with Full fall precautions use walker/cane & assistance as needed   Disposition Home    Diet: Heart Healthy , low carb.   On your next visit with your primary care physician please Get Medicines reviewed and adjusted.   Please request your Prim.MD to go over all Hospital Tests and Procedure/Radiological results at the follow up, please get all Hospital records sent to your Prim MD by signing hospital release before you go home.   If you experience worsening of your admission symptoms, develop shortness of breath, life threatening emergency, suicidal or homicidal thoughts you must seek medical attention immediately by calling 911 or calling your MD immediately  if symptoms less severe.  You Must read complete instructions/literature along with all the possible adverse reactions/side effects for all the Medicines you take and that have been prescribed to you. Take any new Medicines after you have completely understood and accpet all the possible adverse reactions/side effects.   Do not drive, operating heavy machinery, perform activities at heights, swimming or participation in water activities or provide baby sitting services if your were admitted for syncope or siezures until you have seen by Primary MD or a Neurologist and advised to do so again.  Do not drive when taking Pain medications.    Do not take more than prescribed Pain, Sleep and Anxiety Medications  Special Instructions: If you have smoked or chewed Tobacco  in the last 2 yrs please stop smoking, stop any regular Alcohol  and or any Recreational drug use.  Wear Seat belts while driving.   Please note  You were cared for by  a hospitalist during your hospital stay. If you have any questions about your discharge medications or the care you received while you were in the hospital after you are discharged, you can call the unit and asked to speak with the hospitalist on call if the hospitalist that took care of you is not available. Once you are discharged, your primary care physician will handle any further medical issues. Please note that NO REFILLS for any discharge medications will be authorized once you are discharged, as it is imperative that you return to your primary care physician (or establish a relationship with a primary care physician if you do not have one) for your aftercare needs so that they can reassess your need for medications and monitor your lab values.   Increase activity slowly   Complete by: As directed    No wound care   Complete by: As directed       Allergies as of 04/10/2022       Reactions   Citrus Shortness Of Breath   Cucumber Extract Anaphylaxis   Norvasc [amlodipine] Other (See Comments)   Muscle became very tight   Apple Juice    Other Other (See Comments)   Tree nuts, walnuts   Aspirin Rash   Ibuprofen Rash   Niacin And Related Rash  Medication List     STOP taking these medications    oxyCODONE 5 MG immediate release tablet Commonly known as: Roxicodone       TAKE these medications    alprazolam 2 MG tablet Commonly known as: XANAX Take 0.5 tablets (1 mg total) by mouth 2 (two) times daily as needed for anxiety. What changed: how much to take   atorvastatin 80 MG tablet Commonly known as: LIPITOR Take 1 tablet (80 mg total) by mouth daily. Start taking on: April 11, 2022   cloNIDine 0.2 MG tablet Commonly known as: CATAPRES Take 1 tablet (0.2 mg total) by mouth 2 (two) times daily. What changed: how much to take   clopidogrel 75 MG tablet Commonly known as: PLAVIX Take 1 tablet (75 mg total) by mouth daily.   EPINEPHrine 0.3 mg/0.3 mL Soaj  injection Commonly known as: EPI-PEN Inject 0.3 mLs (0.3 mg total) into the muscle once as needed (allergic reaction). What changed:  when to take this reasons to take this   HYDROcodone-acetaminophen 10-325 MG tablet Commonly known as: NORCO Take 1 tablet by mouth in the morning and at bedtime.   losartan 25 MG tablet Commonly known as: COZAAR Take 1 tablet (25 mg total) by mouth daily. Start taking on: April 11, 2022   metFORMIN 500 MG tablet Commonly known as: GLUCOPHAGE Take 1 tablet (500 mg total) by mouth 2 (two) times daily with a meal. Start taking on: April 12, 2022 What changed: These instructions start on April 12, 2022. If you are unsure what to do until then, ask your doctor or other care provider.   metoprolol tartrate 50 MG tablet Commonly known as: LOPRESSOR Take 1 tablet (50 mg total) by mouth 2 (two) times daily.   Narcan 4 MG/0.1ML Liqd nasal spray kit Generic drug: naloxone Place 0.4 mg into the nose as needed (opoid overdose).   nicotine 14 mg/24hr patch Commonly known as: NICODERM CQ - dosed in mg/24 hours Place 1 patch (14 mg total) onto the skin daily. Start taking on: April 11, 2022   nitroGLYCERIN 0.4 MG SL tablet Commonly known as: NITROSTAT Place 1 tablet (0.4 mg total) under the tongue every 5 (five) minutes as needed for chest pain.   pantoprazole 40 MG tablet Commonly known as: PROTONIX Take 1 tablet (40 mg total) by mouth daily. Start taking on: April 11, 2022   predniSONE 10 MG (21) Tbpk tablet Commonly known as: STERAPRED UNI-PAK 21 TAB Use per package instructions   traZODone 150 MG tablet Commonly known as: DESYREL Take 150 mg by mouth at bedtime.   Xtampza ER 13.5 MG C12a Generic drug: oxyCODONE ER Take 1 capsule by mouth 2 (two) times daily as needed (pain).        Follow-up Information     Dixie Dials, MD Follow up in 2 week(s).   Specialty: Cardiology Contact information: [place]  [address] [phone]         Hawken, Thomas N, MD Follow up in 2 week(s).   Specialties: Vascular Surgery, Interventional Cardiology Contact information: [place] [address] [phone]                Allergies  Allergen Reactions   Citrus Shortness Of Breath   Cucumber Extract Anaphylaxis   Norvasc [Amlodipine] Other (See Comments)    Muscle became very tight   Apple Juice    Other Other (See Comments)    Tree nuts, walnuts  Aspirin Rash   Ibuprofen Rash   Niacin And Related Rash    Consultations: cardiology Dr. Doylene Canard   Procedures/Studies: CARDIAC CATHETERIZATION  Result Date: 04/09/2022   Ost LAD to Prox LAD lesion is 50% stenosed.   Mid Cx lesion is 40% stenosed.   Dist Cx lesion is 50% stenosed. Life style modification for mild to moderate multivessel disease.   VAS [place] ABI WITH/WO TBI  Result Date: 04/09/2022  LOWER EXTREMITY DOPPLER STUDY Patient Name:  JANAN BOGIE  Date of Exam:   04/08/2022 Medical Rec #: [id number]       Accession #:    [id number] Date of Birth: May 07, 1966       Patient Gender: F Patient Age:   29 years Exam Location:  [facility] Procedure:      VAS [place] ABI WITH/WO TBI Referring Phys: Aldona Bar RHYNE --------------------------------------------------------------------------------  Indications: Femoral artery occlusion, left. High Risk Factors: Hypertension, Diabetes.  Comparison Study: no prior Performing Technologist: Archie Patten RVS  Examination Guidelines: A complete evaluation includes at minimum, Doppler waveform signals and systolic blood pressure reading at the level of bilateral brachial, anterior tibial, and posterior tibial arteries, when vessel segments are accessible. Bilateral testing is considered an integral part of a complete examination. Photoelectric Plethysmograph (PPG) waveforms and toe systolic pressure readings are included as required and additional duplex testing as needed. Limited examinations  for reoccurring indications may be performed as noted.  ABI Findings: +---------+------------------+-----+---------+--------+ Right    Rt Pressure (mmHg)IndexWaveform Comment  +---------+------------------+-----+---------+--------+ Brachial 155                    triphasic         +---------+------------------+-----+---------+--------+ PTA      121               0.74 biphasic          +---------+------------------+-----+---------+--------+ DP       151               0.93 triphasic         +---------+------------------+-----+---------+--------+ Theodoro Parma               0.69 Abnormal          +---------+------------------+-----+---------+--------+ +---------+------------------+-----+-------------------+-----------------------+ Left     Lt Pressure (mmHg)IndexWaveform           Comment                 +---------+------------------+-----+-------------------+-----------------------+ Brachial 163                    triphasic                                  +---------+------------------+-----+-------------------+-----------------------+ PTA      66                0.40 dampened monophasic                        +---------+------------------+-----+-------------------+-----------------------+ DP       64                0.39 dampened monophasic                        +---------+------------------+-----+-------------------+-----------------------+ Great Toe  unable to obtain                                                           pressure due to                                                            decreased great toe                                                        amplitude               +---------+------------------+-----+-------------------+-----------------------+ +-------+-----------+-----------+------------+------------+ ABI/TBIToday's ABIToday's TBIPrevious ABIPrevious TBI  +-------+-----------+-----------+------------+------------+ Right  0.93       0.69                                +-------+-----------+-----------+------------+------------+ Left   0.40                                           +-------+-----------+-----------+------------+------------+   Summary: Right: Resting right ankle-brachial index indicates mild right lower extremity arterial disease. The right toe-brachial index is abnormal. Left: Resting left ankle-brachial index indicates severe left lower extremity arterial disease. *See table(s) above for measurements and observations.  Electronically signed by Jamelle Haring on 04/09/2022 at 7:26:35 AM.    Final    ECHOCARDIOGRAM COMPLETE  Result Date: 04/08/2022    ECHOCARDIOGRAM REPORT   Patient Name:   KLOHE LOVERING Date of Exam: 04/08/2022 Medical Rec #:  [id number]      Height:       62.5 in Accession #:    [id number]     Weight:       207.9 lb Date of Birth:  Apr 30, 1966      BSA:          1.955 m Patient Age:    56 years       BP:           198/108 mmHg Patient Gender: F              HR:           96 bpm. Exam Location:  Inpatient Procedure: 2D Echo, Cardiac Doppler and Color Doppler Indications:    R07.9* Chest pain, unspecified  History:        Patient has no prior history of Echocardiogram examinations.                 Risk Factors:Hypertension and Diabetes.  Sonographer:    Bernadene Person RDCS Referring Phys: [place]  1. Inferior Septal / apical hypokinesis . Left ventricular ejection fraction, by estimation, is 35 to 40%. The left ventricle has moderately decreased function. The left ventricle demonstrates regional wall motion abnormalities (see scoring diagram/findings for  description). The left ventricular internal cavity size was mildly dilated. Left ventricular diastolic parameters were normal.  2. Right ventricular systolic function is normal. The right ventricular size is normal.  3. A small pericardial  effusion is present. The pericardial effusion is posterior to the left ventricle and anterior to the right ventricle.  4. The mitral valve is abnormal. Trivial mitral valve regurgitation. No evidence of mitral stenosis.  5. The aortic valve was not well visualized. There is mild calcification of the aortic valve. There is mild thickening of the aortic valve. Aortic valve regurgitation is trivial. Aortic valve sclerosis is present, with no evidence of aortic valve stenosis.  6. The inferior vena cava is normal in size with greater than 50% respiratory variability, suggesting right atrial pressure of 3 mmHg. FINDINGS  Left Ventricle: Inferior Septal / apical hypokinesis. Left ventricular ejection fraction, by estimation, is 35 to 40%. The left ventricle has moderately decreased function. The left ventricle demonstrates regional wall motion abnormalities. The left ventricular internal cavity size was mildly dilated. There is no left ventricular hypertrophy. Left ventricular diastolic parameters were normal. Right Ventricle: The right ventricular size is normal. No increase in right ventricular wall thickness. Right ventricular systolic function is normal. Left Atrium: Left atrial size was normal in size. Right Atrium: Right atrial size was normal in size. Pericardium: A small pericardial effusion is present. The pericardial effusion is posterior to the left ventricle and anterior to the right ventricle. Mitral Valve: The mitral valve is abnormal. There is mild thickening of the mitral valve leaflet(s). There is mild calcification of the mitral valve leaflet(s). Mild mitral annular calcification. Trivial mitral valve regurgitation. No evidence of mitral valve stenosis. Tricuspid Valve: The tricuspid valve is normal in structure. Tricuspid valve regurgitation is trivial. No evidence of tricuspid stenosis. Aortic Valve: The aortic valve was not well visualized. There is mild calcification of the aortic valve. There is  mild thickening of the aortic valve. Aortic valve regurgitation is trivial. Aortic regurgitation PHT measures 481 msec. Aortic valve sclerosis is present, with no evidence of aortic valve stenosis. Pulmonic Valve: The pulmonic valve was normal in structure. Pulmonic valve regurgitation is not visualized. No evidence of pulmonic stenosis. Aorta: The aortic root is normal in size and structure. Venous: The inferior vena cava is normal in size with greater than 50% respiratory variability, suggesting right atrial pressure of 3 mmHg. IAS/Shunts: No atrial level shunt detected by color flow Doppler.  LEFT VENTRICLE PLAX 2D LVIDd:         4.70 cm      Diastology LVIDs:         3.90 cm      LV e' medial:    5.93 cm/s LV PW:         1.10 cm      LV E/e' medial:  12.5 LV IVS:        1.00 cm      LV e' lateral:   6.68 cm/s LVOT diam:     2.00 cm      LV E/e' lateral: 11.1 LV SV:         60 LV SV Index:   31 LVOT Area:     3.14 cm  LV Volumes (MOD) LV vol d, MOD A2C: 126.0 ml LV vol d, MOD A4C: 93.4 ml LV vol s, MOD A2C: 75.2 ml LV vol s, MOD A4C: 48.9 ml LV SV MOD A2C:     50.8 ml LV SV MOD A4C:  93.4 ml LV SV MOD BP:      46.4 ml RIGHT VENTRICLE RV S prime:     11.90 cm/s TAPSE (M-mode): 1.7 cm LEFT ATRIUM             Index        RIGHT ATRIUM          Index LA diam:        2.70 cm 1.38 cm/m   RA Area:     9.28 cm LA Vol (A2C):   38.6 ml 19.74 ml/m  RA Volume:   17.80 ml 9.10 ml/m LA Vol (A4C):   31.5 ml 16.11 ml/m LA Biplane Vol: 36.0 ml 18.41 ml/m  AORTIC VALVE LVOT Vmax:         120.00 cm/s LVOT Vmean:        79.900 cm/s LVOT VTI:          0.190 m AI PHT:            481 msec AR Vena Contracta: 0.20 cm  AORTA Ao Root diam: 3.20 cm Ao Asc diam:  3.60 cm MITRAL VALVE MV Area (PHT): 5.13 cm    SHUNTS MV Decel Time: 148 msec    Systemic VTI:  0.19 m MV E velocity: 74.10 cm/s  Systemic Diam: 2.00 cm MV A velocity: 61.30 cm/s MV E/A ratio:  1.21 Jenkins Rouge MD Electronically signed by Jenkins Rouge MD Signature  Date/Time: 04/08/2022/1:51:06 PM    Final    CT Angio Chest/Abd/Pel for Dissection W and/or Wo Contrast  Result Date: 04/08/2022 CLINICAL DATA:  Chest pain and back pain. EXAM: CT ANGIOGRAPHY CHEST, ABDOMEN AND PELVIS TECHNIQUE: Non-contrast CT of the chest was initially obtained. Multidetector CT imaging through the chest, abdomen and pelvis was performed using the standard protocol during bolus administration of intravenous contrast. Multiplanar reconstructed images and MIPs were obtained and reviewed to evaluate the vascular anatomy. RADIATION DOSE REDUCTION: This exam was performed according to the departmental dose-optimization program which includes automated exposure control, adjustment of the mA and/or kV according to patient size and/or use of iterative reconstruction technique. CONTRAST:  132m OMNIPAQUE IOHEXOL 350 MG/ML SOLN COMPARISON:  None. FINDINGS: CTA CHEST FINDINGS Cardiovascular: Preferential opacification of the thoracic aorta. No evidence of thoracic aortic aneurysm or dissection. Normal heart size. There is a small pericardial effusion. Mediastinum/Nodes: No enlarged mediastinal, hilar, or axillary lymph nodes. Thyroid gland, trachea, and esophagus demonstrate no significant findings. Lungs/Pleura: There is minimal atelectasis in the inferior left upper lobe. The lungs are otherwise clear. No pleural effusion or pneumothorax identified. Musculoskeletal: No acute fractures are identified. There is an ill-defined low attenuation collection in the left breast measuring approximately 4.0 x 7.4 x 3.0 cm. There is no surrounding enhancement. Review of the MIP images confirms the above findings. CTA ABDOMEN AND PELVIS FINDINGS VASCULAR Aorta: Normal caliber aorta without aneurysm, dissection, vasculitis or significant stenosis. There is severe atherosclerotic disease throughout the aorta. There ulcerated plaques in the proximal abdominal aorta. There is also calcified atherosclerotic disease.  Celiac: Patent without evidence of aneurysm, dissection, vasculitis or significant stenosis. SMA: Patent without evidence of aneurysm, dissection, vasculitis or significant stenosis. Renals: Severe focal stenosis origin right renal artery. Left renal artery within normal limits. IMA: Not seen. Inflow: Patent without evidence of aneurysm, dissection, vasculitis or significant stenosis. There is occlusion of the visualized proximal left femoral artery. Veins: No obvious venous abnormality within the limitations of this arterial phase study. Review of the MIP images confirms the  above findings. NON-VASCULAR Hepatobiliary: No focal liver abnormality is seen. No gallstones, gallbladder wall thickening, or biliary dilatation. Pancreas: Unremarkable. No pancreatic ductal dilatation or surrounding inflammatory changes. Spleen: Normal in size without focal abnormality. Adrenals/Urinary Tract: There is some patchy areas of cortical hypodensity in the superior pole the right kidney. There is no hydronephrosis or perinephric fluid. Adrenal glands and bladder are within normal limits. Stomach/Bowel: Stomach is within normal limits. Appendix appears normal. No evidence of bowel wall thickening, distention, or inflammatory changes. Lymphatic: No enlarged lymph nodes are seen. Reproductive: Status post hysterectomy. No adnexal masses. Other: No abdominal wall hernia or abnormality. No abdominopelvic ascites. Musculoskeletal: No acute or significant osseous findings. Review of the MIP images confirms the above findings. IMPRESSION: 1. No evidence for aortic dissection or aneurysm. 2. Severe atherosclerotic disease of the abdominal aorta with noncalcified plaque and ulcerated plaque. 3. 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 4. Severe focal stenosis origin of the right renal artery. 5. IMA not visualized. 6. Patchy hypodensity superior pole the right kidney, indeterminate. Findings may represent pyelonephritis or  infarct. Other focal lesion can not be excluded. Consider follow-up evaluation with dedicated renal CT or MRI. 7. Small pericardial effusion. 8. Low-density collection in the left breast, indeterminate. Findings may be related to fluid collection or resolving hematoma. Recommend clinical correlation. Consider ultrasound. Electronically Signed   By: Ronney Asters M.D.   On: 04/08/2022 00:53   VAS [place] LOWER EXTREMITY VENOUS (DVT)  Result Date: 04/09/2022  Lower Venous DVT Study Patient Name:  AYSE MCCARTIN  Date of Exam:   04/09/2022 Medical Rec #: [id number]       Accession #:    [id number] Date of Birth: February 24, 1966       Patient Gender: F Patient Age:   4 years Exam Location:  [facility] Procedure:      VAS [place] LOWER EXTREMITY VENOUS (DVT) Referring Phys: Slayden Mennenga --------------------------------------------------------------------------------  Indications: D-dimer 1.87.  Comparison Study: No prior venous studies. Left femoral artery occlusion on CTA Performing Technologist: Darlin Coco RDMS, RVT  Examination Guidelines: A complete evaluation includes B-mode imaging, spectral Doppler, color Doppler, and power Doppler as needed of all accessible portions of each vessel. Bilateral testing is considered an integral part of a complete examination. Limited examinations for reoccurring indications may be performed as noted. The reflux portion of the exam is performed with the patient in reverse Trendelenburg.  +---------+---------------+---------+-----------+----------+--------------+ RIGHT    CompressibilityPhasicitySpontaneityPropertiesThrombus Aging +---------+---------------+---------+-----------+----------+--------------+ CFV      Full           Yes      Yes                                 +---------+---------------+---------+-----------+----------+--------------+ SFJ      Full                                                         +---------+---------------+---------+-----------+----------+--------------+ FV Prox  Full                                                        +---------+---------------+---------+-----------+----------+--------------+  FV Mid   Full                                                        +---------+---------------+---------+-----------+----------+--------------+ FV DistalFull                                                        +---------+---------------+---------+-----------+----------+--------------+ PFV      Full                                                        +---------+---------------+---------+-----------+----------+--------------+ POP      Full           Yes      Yes                                 +---------+---------------+---------+-----------+----------+--------------+ PTV      Full                                                        +---------+---------------+---------+-----------+----------+--------------+ PERO     Full                                                        +---------+---------------+---------+-----------+----------+--------------+ Gastroc  Full                                                        +---------+---------------+---------+-----------+----------+--------------+   +---------+---------------+---------+-----------+----------+--------------+ LEFT     CompressibilityPhasicitySpontaneityPropertiesThrombus Aging +---------+---------------+---------+-----------+----------+--------------+ CFV      Full           Yes      Yes                                 +---------+---------------+---------+-----------+----------+--------------+ SFJ      Full                                                        +---------+---------------+---------+-----------+----------+--------------+ FV Prox  Full                                                         +---------+---------------+---------+-----------+----------+--------------+  FV Mid   Full                                                        +---------+---------------+---------+-----------+----------+--------------+ FV DistalFull                                                        +---------+---------------+---------+-----------+----------+--------------+ PFV      Full                                                        +---------+---------------+---------+-----------+----------+--------------+ POP      Full           Yes      Yes                                 +---------+---------------+---------+-----------+----------+--------------+ PTV      Full                                                        +---------+---------------+---------+-----------+----------+--------------+ PERO     Full                                                        +---------+---------------+---------+-----------+----------+--------------+ Gastroc  Full                                                        +---------+---------------+---------+-----------+----------+--------------+     Summary: RIGHT: - There is no evidence of deep vein thrombosis in the lower extremity.  - No cystic structure found in the popliteal fossa.  LEFT: - There is no evidence of deep vein thrombosis in the lower extremity.  - No cystic structure found in the popliteal fossa.  *See table(s) above for measurements and observations. Electronically signed by Monica Martinez MD on 04/09/2022 at 8:47:30 PM.    Final      Subjective:  Significant events overnight, she had an episode of pain during swallowing today which improved after Maalox. Discharge Exam: Vitals:   04/10/22 0441 04/10/22 0743  BP: (!) 153/90 (!) 145/93  Pulse: 80 90  Resp: 14 16  Temp: 97.9 F (36.6 C) 97.9 F (36.6 C)  SpO2: 97% 98%   Vitals:   04/09/22 2235 04/09/22 2355 04/10/22 0441 04/10/22 0743  BP: 129/72  121/80 (!) 153/90 (!) 145/93  Pulse: 90 76 80 90  Resp: _0 Temp:  98.2 F (36.8  C) 97.9 F (36.6 C) 97.9 F (36.6 C)  TempSrc:  Axillary Oral Oral  SpO2: 100% 93% 97% 98%  Weight:      Height:        General: Pt is alert, awake, not in acute distress Cardiovascular: RRR, S1/S2 +, no rubs, no gallops Respiratory: CTA bilaterally, no wheezing, no rhonchi Abdominal: Soft, NT, ND, bowel sounds + Extremities: no edema, no cyanosis    The results of significant diagnostics from this hospitalization (including imaging, microbiology, ancillary and laboratory) are listed below for reference.     Microbiology: Recent Results (from the past 240 hour(s))  Culture, blood (Routine X 2) w Reflex to ID Panel     Status: None (Preliminary result)   Collection Time: 04/08/22  7:52 AM   Specimen: BLOOD  Result Value Ref Range Status   Specimen Description BLOOD RIGHT ANTECUBITAL  Final   Special Requests   Final    BOTTLES DRAWN AEROBIC AND ANAEROBIC Blood Culture adequate volume   Culture   Final    NO GROWTH 2 DAYS Performed at [facility], [address] [place], [place] [address]    Report Status PENDING  Incomplete  Culture, blood (Routine X 2) w Reflex to ID Panel     Status: None (Preliminary result)   Collection Time: 04/08/22  7:52 AM   Specimen: BLOOD  Result Value Ref Range Status   Specimen Description BLOOD BLOOD RIGHT FOREARM  Final   Special Requests   Final    BOTTLES DRAWN AEROBIC AND ANAEROBIC Blood Culture adequate volume   Culture   Final    NO GROWTH 2 DAYS Performed at [facility], [address], [place], [place] [address]    Report Status PENDING  Incomplete     Labs: BNP (last 3 results) No results for input(s): BNP in the last 8760 hours. Basic Metabolic Panel: Recent Labs  Lab 04/07/22 2229 04/08/22 0623 04/09/22 0740 04/10/22 0324  NA 136 131* 132* 134*  K 4.0 4.6 4.1 5.0  CL 101 99 99 106  CO2 24 20* 22 21*  GLUCOSE  152* 236* 151* 208*  BUN 11 14 29* 34*  CREATININE 1.25* 1.18* 1.84* 1.81*  CALCIUM 9.5 9.2 8.7* 8.5*   Liver Function Tests: Recent Labs  Lab 04/08/22 0623  AST 16  ALT 10  ALKPHOS 65  BILITOT 0.6  PROT 7.3  ALBUMIN 3.1*   No results for input(s): LIPASE, AMYLASE in the last 168 hours. No results for input(s): AMMONIA in the last 168 hours. CBC: Recent Labs  Lab 04/07/22 2229 04/08/22 0623 04/09/22 0740 04/10/22 0324  WBC 10.3 7.7 8.5 7.1  NEUTROABS  --  6.0  --   --   HGB 13.5 13.2 12.6 11.7*  HCT 41.6 41.0 38.5 36.0  MCV 91.0 90.9 89.1 90.0  PLT 315 278 304 266   Cardiac Enzymes: No results for input(s): CKTOTAL, CKMB, CKMBINDEX, TROPONINI in the last 168 hours. BNP: Invalid input(s): POCBNP CBG: Recent Labs  Lab 04/09/22 1210 04/09/22 1811 04/09/22 2025 04/10/22 0745 04/10/22 1107  GLUCAP 142* 92 144* 283* 176*   D-Dimer Recent Labs    04/09/22 0928  DDIMER 1.87*   Hgb A1c Recent Labs    04/09/22 0740  HGBA1C 6.5*   Lipid Profile Recent Labs    04/09/22 0740  CHOL 325*  HDL 32*  LDLCALC 242*  TRIG 254*  CHOLHDL 10.2   Thyroid function studies Recent Labs  04/08/22 0623  TSH 1.328   Anemia work up No results for input(s): VITAMINB12, FOLATE, FERRITIN, TIBC, IRON, RETICCTPCT in the last 72 hours. Urinalysis    Component Value Date/Time   COLORURINE YELLOW 01/20/2017 0916   APPEARANCEUR CLEAR 01/20/2017 0916   LABSPEC 1.018 01/20/2017 0916   PHURINE 5.0 01/20/2017 0916   GLUCOSEU NEGATIVE 01/20/2017 0916   HGBUR NEGATIVE 01/20/2017 0916   BILIRUBINUR NEGATIVE 01/20/2017 0916   KETONESUR NEGATIVE 01/20/2017 0916   PROTEINUR 100 (A) 01/20/2017 0916   NITRITE NEGATIVE 01/20/2017 0916   LEUKOCYTESUR NEGATIVE 01/20/2017 0916   Sepsis Labs Invalid input(s): PROCALCITONIN,  WBC,  LACTICIDVEN Microbiology Recent Results (from the past 240 hour(s))  Culture, blood (Routine X 2) w Reflex to ID Panel     Status: None (Preliminary  result)   Collection Time: 04/08/22  7:52 AM   Specimen: BLOOD  Result Value Ref Range Status   Specimen Description BLOOD RIGHT ANTECUBITAL  Final   Special Requests   Final    BOTTLES DRAWN AEROBIC AND ANAEROBIC Blood Culture adequate volume   Culture   Final    NO GROWTH 2 DAYS Performed at [facility], [address], [place], [place] [address]    Report Status PENDING  Incomplete  Culture, blood (Routine X 2) w Reflex to ID Panel     Status: None (Preliminary result)   Collection Time: 04/08/22  7:52 AM   Specimen: BLOOD  Result Value Ref Range Status   Specimen Description BLOOD BLOOD RIGHT FOREARM  Final   Special Requests   Final    BOTTLES DRAWN AEROBIC AND ANAEROBIC Blood Culture adequate volume   Culture   Final    NO GROWTH 2 DAYS Performed at [facility], [place] [address], [place], [place] [address]    Report Status PENDING  Incomplete     Time coordinating discharge: Over 30 minutes  SIGNED:   Phillips Climes, MD  [facility] 04/10/2022, 12:02 PM Pager   If 7PM-7AM, please contact night-coverage [email or web address] Password [email or web address]

## 2022-04-10 NOTE — Progress Notes (Signed)
Discharge instructions reviewed with pt.  ?Copy of instructions given to pt. [facility] has delivered pt meds, with exception of 1 med, prednisone. Message sent to MD to have script sent to [facility]. Waiting for response. Will inform pt's primary nurse for today. ? ?Pt currently receiving her IV pepcid as ordered for medication regimen for her admit diagnosis.  ? ?Pt has [facility] transportation arranged to pick her up this afternoon.   ? ?

## 2022-04-10 NOTE — Progress Notes (Signed)
[facility] night cross cover note: ? ?I was notified by RN that patient complaining of generalized pruritus similar to that which she has experienced earlier during this hospitalization and with which she had originally presented.  Not associated with any acute respiratory distress, stridor, difficulty swallowing, shortness of breath, or angioedema.  Vital signs stable, including no hypotension. This Pruritus was refractory to Benadryl 25 mg p.o. x1 administered at 1832. ? ?In the setting of her presenting generalize pruritus at the time of admission, the patient was initially treated with IV Solu-Medrol, which has subsequently been converted to prednisone 10 mg p.o. daily.  She has also been receiving Pepcid IV twice daily, with next dose due at 2200 on 04/09/2022.  ? ?I subsequently ordered hydroxyzine 50 mg p.o. x1 dose and asked that her next dose of IV Pepcid be given now. ? ?Following these measures, the patient continued to complain of generalized pruritus, without any internal change in clinical status, and with vital signs continuing to remain stable.  I then ordered Ativan 1 mg IV x1 dose as well as Solu-Medrol 40 mg IV x1, which is in addition to her currently scheduled prednisone 10 mg p.o. daily, with next dose scheduled to occur on the morning of 04/10/2022. ? ? ? ? ?Newton Pigg, DO ?Hospitalist ? ?

## 2022-04-10 NOTE — Progress Notes (Signed)
Responded to spiritual Care Consult to provide Advance Directives. Form was given to patient.  Pt will have Chaplain paged if  assistance is needed further. ? ?Erin Jackson, [facility], Pager [phone]   ?

## 2022-04-10 NOTE — Plan of Care (Signed)
?  Problem: Education: ?Goal: Knowledge of General Education information will improve ?Description: Including pain rating scale, medication(s)/side effects and non-pharmacologic comfort measures ?Outcome: Adequate for Discharge ?  ?Problem: Clinical Measurements: ?Goal: Ability to maintain clinical measurements within normal limits will improve ?Outcome: Adequate for Discharge ?Goal: Will remain free from infection ?Outcome: Adequate for Discharge ?  ?Problem: Education: ?Goal: Understanding of CV disease, CV risk reduction, and recovery process will improve ?Outcome: Adequate for Discharge ?Goal: Individualized Educational Video(s) ?Outcome: Adequate for Discharge ?  ?Problem: Activity: ?Goal: Ability to return to baseline activity level will improve ?Outcome: Adequate for Discharge ?  ?Problem: Cardiovascular: ?Goal: Ability to achieve and maintain adequate cardiovascular perfusion will improve ?Outcome: Adequate for Discharge ?Goal: Vascular access site(s) Level 0-1 will be maintained ?Outcome: Adequate for Discharge ?  ?Problem: Health Behavior/Discharge Planning: ?Goal: Ability to safely manage health-related needs after discharge will improve ?Outcome: Adequate for Discharge ?  ?

## 2022-04-10 NOTE — Progress Notes (Signed)
Pt scheduled transportation to home through her medicaid.  Her pick up time is 1430. ? ?Hinton Dyer, RN ?

## 2022-04-13 LAB — CULTURE, BLOOD (ROUTINE X 2)
Culture: NO GROWTH
Culture: NO GROWTH
Special Requests: ADEQUATE
Special Requests: ADEQUATE

## 2022-04-14 ENCOUNTER — Encounter (HOSPITAL_COMMUNITY): Payer: Self-pay

## 2022-04-14 ENCOUNTER — Emergency Department (HOSPITAL_COMMUNITY): Payer: Medicare Other

## 2022-04-14 ENCOUNTER — Other Ambulatory Visit: Payer: Self-pay

## 2022-04-14 ENCOUNTER — Emergency Department (HOSPITAL_COMMUNITY)
Admission: EM | Admit: 2022-04-14 | Discharge: 2022-04-14 | Disposition: A | Discharge status: Home / Self Care | Payer: Medicare Other | Attending: Emergency Medicine | Admitting: Emergency Medicine

## 2022-04-14 DIAGNOSIS — R21 Rash and other nonspecific skin eruption: Secondary | ICD-10-CM | POA: Diagnosis not present

## 2022-04-14 DIAGNOSIS — I2510 Atherosclerotic heart disease of native coronary artery without angina pectoris: Secondary | ICD-10-CM | POA: Diagnosis not present

## 2022-04-14 DIAGNOSIS — R079 Chest pain, unspecified: Secondary | ICD-10-CM

## 2022-04-14 DIAGNOSIS — R072 Precordial pain: Secondary | ICD-10-CM | POA: Diagnosis not present

## 2022-04-14 LAB — CBC
HCT: 39.4 % (ref 36.0–46.0)
Hemoglobin: 12.6 g/dL (ref 12.0–15.0)
MCH: 29.5 pg (ref 26.0–34.0)
MCHC: 32 g/dL (ref 30.0–36.0)
MCV: 92.3 fL (ref 80.0–100.0)
Platelets: 281 10*3/uL (ref 150–400)
RBC: 4.27 MIL/uL (ref 3.87–5.11)
RDW: 13.5 % (ref 11.5–15.5)
WBC: 10.8 10*3/uL — ABNORMAL HIGH (ref 4.0–10.5)
nRBC: 0 % (ref 0.0–0.2)

## 2022-04-14 LAB — BASIC METABOLIC PANEL
Anion gap: 10 (ref 5–15)
BUN: 19 mg/dL (ref 6–20)
CO2: 21 mmol/L — ABNORMAL LOW (ref 22–32)
Calcium: 8.8 mg/dL — ABNORMAL LOW (ref 8.9–10.3)
Chloride: 105 mmol/L (ref 98–111)
Creatinine, Ser: 1.62 mg/dL — ABNORMAL HIGH (ref 0.44–1.00)
GFR, Estimated: 37 mL/min — ABNORMAL LOW (ref >60)
Glucose, Bld: 153 mg/dL — ABNORMAL HIGH (ref 70–99)
Potassium: 4.3 mmol/L (ref 3.5–5.1)
Sodium: 136 mmol/L (ref 135–145)

## 2022-04-14 LAB — TROPONIN I (HIGH SENSITIVITY)
Troponin I (High Sensitivity): 11 ng/L (ref <18)
Troponin I (High Sensitivity): 10 ng/L (ref <18)

## 2022-04-14 MED ORDER — CETIRIZINE HCL 10 MG PO TABS: 10.0000 mg | ORAL_TABLET | Freq: Every day | ORAL | 1 refills | Status: AC

## 2022-04-14 NOTE — ED Notes (Signed)
All discharge instructions reviewed with patient including follow up care and prescriptions. Patient verbalized understanding and had no other questions. Patient stable and ambulatory at time of discharge.  

## 2022-04-14 NOTE — ED Triage Notes (Signed)
Pt c/o CP since being admitted & L heart cath 4/20 for CP. Intermittent but constant today/yesterday. Advises it feels like heartburn, but "goes straight through to the back." ?

## 2022-04-14 NOTE — ED Provider Triage Note (Signed)
Emergency Medicine Provider Triage Evaluation Note ? ?Erin Jackson , a 56 y.o. female  was evaluated in triage.  Pt complains of chest pain.  Patient states that she was recently discharged from this facility on Friday, has had continued chest pain since then.  Patient states pain is located in the center of her chest, radiates to her back.  Patient states pain feels like "bricks on my chest".  Patient denies any lower extremity swelling.  Patient endorses shortness of breath. ? ?Review of Systems  ?Positive:  ?Negative:  ? ?Physical Exam  ?BP (!) (P) 188/143   Pulse (!) (P) 130   Temp (P) 98.9 ?F (37.2 ?C) (Oral)  ?Gen:   Awake, no distress   ?Resp:  Normal effort  ?MSK:   Moves extremities without difficulty  ?Other:  No lower extremity swelling.  Lung sounds clear bilaterally. ? ?Medical Decision Making  ?Medically screening exam initiated at 6:55 PM.  Appropriate orders placed.  LESSLIE MOSSA was informed that the remainder of the evaluation will be completed by another provider, this initial triage assessment does not replace that evaluation, and the importance of remaining in the ED until their evaluation is complete. ? ? ?  ?Al Decant, PA-C ?04/14/22 1856 ? ?

## 2022-04-14 NOTE — Discharge Instructions (Addendum)
Return for any problem.  ? ?Continue medications as prescribed at time of your recent discharge. ? ?You may use Zyrtec as prescribed for itch. ? ?Please call Dr. Merrilee Jansky office tomorrow and schedule close follow-up. ?

## 2022-04-14 NOTE — ED Provider Notes (Signed)
[facility] EMERGENCY DEPARTMENT Provider Note   CSN: [id number] Arrival date & time: 04/14/22  1830     History  Chief Complaint  Patient presents with   Chest Pain    Erin Jackson is a 56 y.o. female.  56 year old female with prior medical history as detailed below presents for evaluation.  Patient was recently admitted for chest pain.  She was admitted April 18 through April 21.  Work-up included CT and cardiac catheterization.  Patient was found to have multivessel nonobstructive CAD.  Patient was seen and evaluated by Dr. Doylene Canard.  Patient complains today of persistent chest discomfort since discharge.  She also complains of persistent itching and mild urticarial rash since discharge.  Patient with longstanding history of allergies -both environmental and dietary.  She describes persistent constant diffuse anterior chest wall discomfort.  This does not change with activity or exertion.  She denies associated nausea, vomiting, shortness of breath, or other complaint.  She denies fever.  The history is provided by the patient and medical records.  Chest Pain Pain location:  Substernal area Pain quality: dull   Pain radiates to:  Does not radiate Pain severity:  Mild Onset quality:  Gradual Duration:  10 days Timing:  Constant Progression:  Unchanged Chronicity:  Recurrent     Home Medications Prior to Admission medications   Medication Sig Start Date End Date Taking? Authorizing Provider  acetaminophen (TYLENOL) 500 MG tablet Take 500 mg by mouth every 6 (six) hours as needed for headache.   Yes [provider]  albuterol (VENTOLIN HFA) 108 (90 Base) MCG/ACT inhaler Inhale 2 puffs into the lungs every 6 (six) hours as needed for wheezing or shortness of breath.   Yes [provider]  alprazolam Duanne Moron) 2 MG tablet Take 0.5 tablets (1 mg total) by mouth 2 (two) times daily as needed for anxiety. 04/10/22  Yes Elgergawy, Silver Huguenin, MD   atorvastatin (LIPITOR) 80 MG tablet Take 1 tablet (80 mg total) by mouth daily. 04/11/22  Yes Elgergawy, Silver Huguenin, MD  cetirizine (ZYRTEC ALLERGY) 10 MG tablet Take 1 tablet (10 mg total) by mouth daily. 04/14/22  Yes Valarie Merino, MD  cloNIDine (CATAPRES) 0.2 MG tablet Take 1 tablet (0.2 mg total) by mouth 2 (two) times daily. 04/10/22  Yes Elgergawy, Silver Huguenin, MD  clopidogrel (PLAVIX) 75 MG tablet Take 1 tablet (75 mg total) by mouth daily. 04/10/22  Yes Elgergawy, Silver Huguenin, MD  EPINEPHRINE 0.3 mg/0.3 mL IJ SOAJ injection Inject 0.3 mLs (0.3 mg total) into the muscle once as needed (allergic reaction). Patient taking differently: Inject 0.3 mg into the muscle as needed for anaphylaxis (allergic reaction). 10/04/18  Yes Fabian November, MD  HYDROcodone-acetaminophen (NORCO) 10-325 MG tablet Take 1 tablet by mouth in the morning and at bedtime. 03/20/22  Yes [provider]  losartan (COZAAR) 25 MG tablet Take 1 tablet (25 mg total) by mouth daily. 04/11/22  Yes Elgergawy, Silver Huguenin, MD  metFORMIN (GLUCOPHAGE) 500 MG tablet Take 1 tablet (500 mg total) by mouth 2 (two) times daily with a meal. 04/12/22  Yes Elgergawy, Silver Huguenin, MD  metoprolol tartrate (LOPRESSOR) 50 MG tablet Take 1 tablet (50 mg total) by mouth 2 (two) times daily. 04/10/22  Yes Elgergawy, Silver Huguenin, MD  NARCAN 4 MG/0.1ML LIQD nasal spray kit Place 0.4 mg into the nose as needed (opoid overdose). 01/08/20  Yes [provider]  nitroGLYCERIN (NITROSTAT) 0.4 MG SL tablet Place 1 tablet (0.4  mg total) under the tongue every 5 (five) minutes as needed for chest pain. 04/10/22  Yes Elgergawy, Silver Huguenin, MD  omeprazole (PRILOSEC) 40 MG capsule Take 40 mg by mouth in the morning and at bedtime.   Yes [provider]  pantoprazole (PROTONIX) 40 MG tablet Take 1 tablet (40 mg total) by mouth daily. 04/11/22  Yes Elgergawy, Silver Huguenin, MD  predniSONE (STERAPRED UNI-PAK 21 TAB) 10 MG (21) TBPK tablet Use per package  instructions Patient taking differently: Take 10-60 mg by mouth as directed. Take 6 tablets on Day 1 Take 5 tablets on Day 2 Take 4 tablets on Day 3 Take 3 tablets on Day 4 Take 2 tablets on Day 5 Take 1 tablets on Day 6  Use per package instructions 04/10/22  Yes Elgergawy, Silver Huguenin, MD  traZODone (DESYREL) 150 MG tablet Take 150 mg by mouth at bedtime.   Yes [provider]  nicotine (NICODERM CQ - DOSED IN MG/24 HOURS) 14 mg/24hr patch Place 1 patch (14 mg total) onto the skin daily. 04/11/22   Elgergawy, Silver Huguenin, MD  XTAMPZA ER 13.5 MG C12A Take 1 capsule by mouth 2 (two) times daily as needed (pain). 03/20/22   [provider]      Allergies    Citrus, Cucumber extract, Molds & smuts, Norvasc [amlodipine], Apple juice, Other, Aspirin, Ibuprofen, and Niacin and related    Review of Systems   Review of Systems  Cardiovascular:  Positive for chest pain.  All other systems reviewed and are negative.  Physical Exam Updated Vital Signs BP (!) 186/98 (BP Location: Right Arm)   Pulse (!) 108   Temp (P) 98.9 F (37.2 C) (Oral)   Resp (!) 22   SpO2 97%  Physical Exam Vitals and nursing note reviewed.  Constitutional:      General: She is not in acute distress.    Appearance: Normal appearance. She is well-developed.  HENT:     Head: Normocephalic and atraumatic.  Eyes:     Conjunctiva/sclera: Conjunctivae normal.     Pupils: Pupils are equal, round, and reactive to light.  Cardiovascular:     Rate and Rhythm: Normal rate and regular rhythm.     Heart sounds: Normal heart sounds.  Pulmonary:     Effort: Pulmonary effort is normal. No respiratory distress.     Breath sounds: Normal breath sounds.  Abdominal:     General: There is no distension.     Palpations: Abdomen is soft.     Tenderness: There is no abdominal tenderness.  Musculoskeletal:        General: No deformity. Normal range of motion.     Cervical back: Normal range of motion and neck supple.   Skin:    General: Skin is warm and dry.  Neurological:     General: No focal deficit present.     Mental Status: She is alert and oriented to person, place, and time.    ED Results / Procedures / Treatments   Labs (all labs ordered are listed, but only abnormal results are displayed) Labs Reviewed  BASIC METABOLIC PANEL - Abnormal; Notable for the following components:      Result Value   CO2 21 (*)    Glucose, Bld 153 (*)    Creatinine, Ser 1.62 (*)    Calcium 8.8 (*)    GFR, Estimated 37 (*)    All other components within normal limits  CBC - Abnormal; Notable for the following components:  WBC 10.8 (*)    All other components within normal limits  TROPONIN I (HIGH SENSITIVITY)  TROPONIN I (HIGH SENSITIVITY)    EKG EKG Interpretation  Date/Time:  Tuesday April 14 2022 20:31:07 EDT Ventricular Rate:  112 PR Interval:  137 QRS Duration: 80 QT Interval:  310 QTC Calculation: 424 R Axis:   12 Text Interpretation: Sinus tachycardia Abnormal R-wave progression, early transition Nonspecific T abnormalities, lateral leads Confirmed by Dene Gentry [phone]) on 04/14/2022 8:36:51 PM  Radiology DG Chest 1 View  Result Date: 04/14/2022 CLINICAL DATA:  Chest pain and itching. EXAM: CHEST  1 VIEW COMPARISON:  10/04/2018. FINDINGS: The heart is enlarged the mediastinal contour is within normal limits. Subsegmental atelectasis or scarring is noted in the mid left lung. No consolidation, effusion, or pneumothorax. Cervical spinal fusion hardware is noted. Degenerative changes are present in the thoracic spine. IMPRESSION: 1. Mild subsegmental atelectasis or scarring in mid left lung. 2. Cardiomegaly. Electronically Signed   By: Brett Fairy M.D.   On: 04/14/2022 20:06    Procedures Procedures    Medications Ordered in ED Medications - No data to display  ED Course/ Medical Decision Making/ A&P                           Medical Decision Making Risk OTC drugs.    Medical  Screen Complete  This patient presented to the ED with complaint of chest pain.  This complaint involves an extensive number of treatment options. The initial differential diagnosis includes, but is not limited to, ACS, etc.  This presentation is: Acute, Chronic, Self-Limited, Previously Undiagnosed, Uncertain Prognosis, Complicated, Systemic Symptoms, and Threat to Life/Bodily Function  Patient with recent admission for chest discomfort.  Patient seen and evaluated during admission by Dr. Doylene Canard with cardiology.  Patient's work-up included CT of the chest which demonstrated no specific acute pathology.  Catheterization performed with Dr. Doylene Canard.  Multivessel nonobstructive CAD identified.  Patient was to follow-up closely with Dr. Micheal Likens in the outpatient setting.  Patient is describes as they are not consistent with acute coronary syndrome.  EKG is without evidence of acute ischemia.  Troponin x2 is 11 and then 10.  Case reviewed again with Dr. Doylene Canard.  He agrees the patient is appropriate for discharge home.  Patient understands need for close follow-up in the outpatient setting.  Strict return precautions given and understood.   Co morbidities that complicated the patient's evaluation  Known history of CAD   Additional history obtained:  External records from outside sources obtained and reviewed including prior ED visits and prior Inpatient records.    Lab Tests:  I ordered and personally interpreted labs.  The pertinent results include: CBC, BMP, troponin x2   Imaging Studies ordered:  I ordered imaging studies including chest x-ray I independently visualized and interpreted obtained imaging which showed NAD I agree with the radiologist interpretation.   Cardiac Monitoring:  The patient was maintained on a cardiac monitor.  I personally viewed and interpreted the cardiac monitor which showed an underlying rhythm of: NSR   Problem List / ED Course:  Chest  discomfort   Reevaluation:  After the interventions noted above, I reevaluated the patient and found that they have: improved  Disposition:  After consideration of the diagnostic results and the patients response to treatment, I feel that the patent would benefit from close outpatient follow-up.          Final Clinical Impression(s) /  ED Diagnoses Final diagnoses:  Nonspecific chest pain    Rx / DC Orders ED Discharge Orders          Ordered    cetirizine (ZYRTEC ALLERGY) 10 MG tablet  Daily        04/14/22 2246              Valarie Merino, MD 04/14/22 2255

## 2022-04-20 ENCOUNTER — Inpatient Hospital Stay (HOSPITAL_COMMUNITY)
Admission: EM | Admit: 2022-04-20 | Discharge: 2022-04-29 | DRG: 064 | Disposition: A | Discharge status: Rehabilitation Facility | Payer: Medicare Other | Attending: Internal Medicine | Admitting: Internal Medicine

## 2022-04-20 ENCOUNTER — Encounter (HOSPITAL_COMMUNITY): Payer: Self-pay

## 2022-04-20 ENCOUNTER — Emergency Department (HOSPITAL_COMMUNITY): Payer: Medicare Other

## 2022-04-20 DIAGNOSIS — F1220 Cannabis dependence, uncomplicated: Secondary | ICD-10-CM | POA: Diagnosis present

## 2022-04-20 DIAGNOSIS — R42 Dizziness and giddiness: Secondary | ICD-10-CM

## 2022-04-20 DIAGNOSIS — Z91018 Allergy to other foods: Secondary | ICD-10-CM

## 2022-04-20 DIAGNOSIS — Z7902 Long term (current) use of antithrombotics/antiplatelets: Secondary | ICD-10-CM

## 2022-04-20 DIAGNOSIS — E739 Lactose intolerance, unspecified: Secondary | ICD-10-CM | POA: Diagnosis present

## 2022-04-20 DIAGNOSIS — K219 Gastro-esophageal reflux disease without esophagitis: Secondary | ICD-10-CM | POA: Diagnosis present

## 2022-04-20 DIAGNOSIS — I5022 Chronic systolic (congestive) heart failure: Secondary | ICD-10-CM | POA: Diagnosis present

## 2022-04-20 DIAGNOSIS — I6340 Cerebral infarction due to embolism of unspecified cerebral artery: Secondary | ICD-10-CM | POA: Insufficient documentation

## 2022-04-20 DIAGNOSIS — E669 Obesity, unspecified: Secondary | ICD-10-CM | POA: Diagnosis present

## 2022-04-20 DIAGNOSIS — I6381 Other cerebral infarction due to occlusion or stenosis of small artery: Secondary | ICD-10-CM | POA: Diagnosis present

## 2022-04-20 DIAGNOSIS — Z7984 Long term (current) use of oral hypoglycemic drugs: Secondary | ICD-10-CM

## 2022-04-20 DIAGNOSIS — R4182 Altered mental status, unspecified: Secondary | ICD-10-CM

## 2022-04-20 DIAGNOSIS — Z6838 Body mass index (BMI) 38.0-38.9, adult: Secondary | ICD-10-CM

## 2022-04-20 DIAGNOSIS — I672 Cerebral atherosclerosis: Secondary | ICD-10-CM | POA: Diagnosis present

## 2022-04-20 DIAGNOSIS — E119 Type 2 diabetes mellitus without complications: Secondary | ICD-10-CM

## 2022-04-20 DIAGNOSIS — I6789 Other cerebrovascular disease: Secondary | ICD-10-CM | POA: Diagnosis present

## 2022-04-20 DIAGNOSIS — E872 Acidosis, unspecified: Secondary | ICD-10-CM | POA: Diagnosis not present

## 2022-04-20 DIAGNOSIS — N1831 Chronic kidney disease, stage 3a: Secondary | ICD-10-CM | POA: Diagnosis present

## 2022-04-20 DIAGNOSIS — F17210 Nicotine dependence, cigarettes, uncomplicated: Secondary | ICD-10-CM | POA: Diagnosis present

## 2022-04-20 DIAGNOSIS — Z886 Allergy status to analgesic agent status: Secondary | ICD-10-CM

## 2022-04-20 DIAGNOSIS — E1151 Type 2 diabetes mellitus with diabetic peripheral angiopathy without gangrene: Secondary | ICD-10-CM | POA: Diagnosis present

## 2022-04-20 DIAGNOSIS — I6329 Cerebral infarction due to unspecified occlusion or stenosis of other precerebral arteries: Principal | ICD-10-CM | POA: Diagnosis present

## 2022-04-20 DIAGNOSIS — I130 Hypertensive heart and chronic kidney disease with heart failure and stage 1 through stage 4 chronic kidney disease, or unspecified chronic kidney disease: Secondary | ICD-10-CM | POA: Diagnosis present

## 2022-04-20 DIAGNOSIS — F319 Bipolar disorder, unspecified: Secondary | ICD-10-CM | POA: Diagnosis present

## 2022-04-20 DIAGNOSIS — E876 Hypokalemia: Secondary | ICD-10-CM | POA: Diagnosis not present

## 2022-04-20 DIAGNOSIS — R29706 NIHSS score 6: Secondary | ICD-10-CM | POA: Diagnosis present

## 2022-04-20 DIAGNOSIS — R110 Nausea: Secondary | ICD-10-CM

## 2022-04-20 DIAGNOSIS — E861 Hypovolemia: Secondary | ICD-10-CM | POA: Diagnosis not present

## 2022-04-20 DIAGNOSIS — I701 Atherosclerosis of renal artery: Secondary | ICD-10-CM | POA: Diagnosis present

## 2022-04-20 DIAGNOSIS — I3139 Other pericardial effusion (noninflammatory): Secondary | ICD-10-CM | POA: Diagnosis present

## 2022-04-20 DIAGNOSIS — E1122 Type 2 diabetes mellitus with diabetic chronic kidney disease: Secondary | ICD-10-CM | POA: Diagnosis present

## 2022-04-20 DIAGNOSIS — I160 Hypertensive urgency: Secondary | ICD-10-CM | POA: Diagnosis present

## 2022-04-20 DIAGNOSIS — E785 Hyperlipidemia, unspecified: Secondary | ICD-10-CM | POA: Diagnosis present

## 2022-04-20 DIAGNOSIS — Z20822 Contact with and (suspected) exposure to covid-19: Secondary | ICD-10-CM | POA: Diagnosis present

## 2022-04-20 DIAGNOSIS — R29810 Facial weakness: Secondary | ICD-10-CM | POA: Diagnosis present

## 2022-04-20 DIAGNOSIS — N179 Acute kidney failure, unspecified: Secondary | ICD-10-CM | POA: Diagnosis present

## 2022-04-20 DIAGNOSIS — G8194 Hemiplegia, unspecified affecting left nondominant side: Secondary | ICD-10-CM | POA: Diagnosis present

## 2022-04-20 DIAGNOSIS — I2510 Atherosclerotic heart disease of native coronary artery without angina pectoris: Secondary | ICD-10-CM | POA: Diagnosis present

## 2022-04-20 DIAGNOSIS — Z79899 Other long term (current) drug therapy: Secondary | ICD-10-CM

## 2022-04-20 DIAGNOSIS — I674 Hypertensive encephalopathy: Secondary | ICD-10-CM | POA: Diagnosis present

## 2022-04-20 DIAGNOSIS — F419 Anxiety disorder, unspecified: Secondary | ICD-10-CM | POA: Diagnosis present

## 2022-04-20 DIAGNOSIS — G894 Chronic pain syndrome: Secondary | ICD-10-CM | POA: Diagnosis present

## 2022-04-20 DIAGNOSIS — I161 Hypertensive emergency: Secondary | ICD-10-CM | POA: Diagnosis present

## 2022-04-20 DIAGNOSIS — I708 Atherosclerosis of other arteries: Secondary | ICD-10-CM | POA: Diagnosis present

## 2022-04-20 DIAGNOSIS — D631 Anemia in chronic kidney disease: Secondary | ICD-10-CM | POA: Diagnosis present

## 2022-04-20 DIAGNOSIS — Z794 Long term (current) use of insulin: Secondary | ICD-10-CM

## 2022-04-20 LAB — CBC WITH DIFFERENTIAL/PLATELET
Abs Immature Granulocytes: 0.05 10*3/uL (ref 0.00–0.07)
Basophils Absolute: 0 10*3/uL (ref 0.0–0.1)
Basophils Relative: 0 %
Eosinophils Absolute: 0 10*3/uL (ref 0.0–0.5)
Eosinophils Relative: 0 %
HCT: 40.6 % (ref 36.0–46.0)
Hemoglobin: 13.1 g/dL (ref 12.0–15.0)
Immature Granulocytes: 1 %
Lymphocytes Relative: 28 %
Lymphs Abs: 2.8 10*3/uL (ref 0.7–4.0)
MCH: 29.4 pg (ref 26.0–34.0)
MCHC: 32.3 g/dL (ref 30.0–36.0)
MCV: 91.2 fL (ref 80.0–100.0)
Monocytes Absolute: 0.4 10*3/uL (ref 0.1–1.0)
Monocytes Relative: 4 %
Neutro Abs: 6.9 10*3/uL (ref 1.7–7.7)
Neutrophils Relative %: 67 %
Platelets: 276 10*3/uL (ref 150–400)
RBC: 4.45 MIL/uL (ref 3.87–5.11)
RDW: 13.3 % (ref 11.5–15.5)
WBC: 10.3 10*3/uL (ref 4.0–10.5)
nRBC: 0 % (ref 0.0–0.2)

## 2022-04-20 LAB — COMPREHENSIVE METABOLIC PANEL
ALT: 13 U/L (ref 0–44)
AST: 19 U/L (ref 15–41)
Albumin: 3.1 g/dL — ABNORMAL LOW (ref 3.5–5.0)
Alkaline Phosphatase: 65 U/L (ref 38–126)
Anion gap: 13 (ref 5–15)
BUN: 14 mg/dL (ref 6–20)
CO2: 24 mmol/L (ref 22–32)
Calcium: 7.5 mg/dL — ABNORMAL LOW (ref 8.9–10.3)
Chloride: 101 mmol/L (ref 98–111)
Creatinine, Ser: 1.28 mg/dL — ABNORMAL HIGH (ref 0.44–1.00)
GFR, Estimated: 49 mL/min — ABNORMAL LOW (ref >60)
Glucose, Bld: 200 mg/dL — ABNORMAL HIGH (ref 70–99)
Potassium: 3.5 mmol/L (ref 3.5–5.1)
Sodium: 138 mmol/L (ref 135–145)
Total Bilirubin: 0.4 mg/dL (ref 0.3–1.2)
Total Protein: 7.7 g/dL (ref 6.5–8.1)

## 2022-04-20 LAB — I-STAT CHEM 8, ED
BUN: 16 mg/dL (ref 6–20)
Calcium, Ion: 0.88 mmol/L — CL (ref 1.15–1.40)
Chloride: 100 mmol/L (ref 98–111)
Creatinine, Ser: 1.1 mg/dL — ABNORMAL HIGH (ref 0.44–1.00)
Glucose, Bld: 207 mg/dL — ABNORMAL HIGH (ref 70–99)
HCT: 43 % (ref 36.0–46.0)
Hemoglobin: 14.6 g/dL (ref 12.0–15.0)
Potassium: 3.7 mmol/L (ref 3.5–5.1)
Sodium: 139 mmol/L (ref 135–145)
TCO2: 25 mmol/L (ref 22–32)

## 2022-04-20 LAB — RAPID URINE DRUG SCREEN, HOSP PERFORMED
Amphetamines: NOT DETECTED
Barbiturates: NOT DETECTED
Benzodiazepines: NOT DETECTED
Cocaine: NOT DETECTED
Opiates: POSITIVE — AB
Tetrahydrocannabinol: POSITIVE — AB

## 2022-04-20 LAB — CBG MONITORING, ED
Glucose-Capillary: 179 mg/dL — ABNORMAL HIGH (ref 70–99)
Glucose-Capillary: 218 mg/dL — ABNORMAL HIGH (ref 70–99)
Glucose-Capillary: 256 mg/dL — ABNORMAL HIGH (ref 70–99)

## 2022-04-20 LAB — TROPONIN I (HIGH SENSITIVITY)
Troponin I (High Sensitivity): 10 ng/L (ref <18)
Troponin I (High Sensitivity): 11 ng/L (ref <18)

## 2022-04-20 LAB — LIPASE, BLOOD: Lipase: 38 U/L (ref 11–51)

## 2022-04-20 LAB — BRAIN NATRIURETIC PEPTIDE: B Natriuretic Peptide: 243.7 pg/mL — ABNORMAL HIGH (ref 0.0–100.0)

## 2022-04-20 LAB — LACTIC ACID, PLASMA: Lactic Acid, Venous: 2 mmol/L (ref 0.5–1.9)

## 2022-04-20 MED ORDER — MORPHINE SULFATE (PF) 4 MG/ML IV SOLN
4.0000 mg | Freq: Once | INTRAVENOUS | Status: AC
  Administered 2022-04-20: 4 mg via INTRAVENOUS
  Filled 2022-04-20: qty 1

## 2022-04-20 MED ORDER — IOHEXOL 350 MG/ML SOLN
100.0000 mL | Freq: Once | INTRAVENOUS | Status: AC | PRN
  Administered 2022-04-20: 100 mL via INTRAVENOUS

## 2022-04-20 MED ORDER — ONDANSETRON HCL 4 MG/2ML IJ SOLN
4.0000 mg | Freq: Once | INTRAMUSCULAR | Status: AC
  Administered 2022-04-20: 4 mg via INTRAVENOUS
  Filled 2022-04-20: qty 2

## 2022-04-20 MED ORDER — METOCLOPRAMIDE HCL 5 MG/ML IJ SOLN
5.0000 mg | Freq: Once | INTRAMUSCULAR | Status: AC
  Administered 2022-04-20: 5 mg via INTRAVENOUS
  Filled 2022-04-20: qty 2

## 2022-04-20 MED ORDER — NITROGLYCERIN 0.4 MG SL SUBL
0.4000 mg | SUBLINGUAL_TABLET | SUBLINGUAL | Status: AC | PRN
  Administered 2022-04-20 – 2022-04-22 (×3): 0.4 mg via SUBLINGUAL
  Filled 2022-04-20 (×2): qty 1

## 2022-04-20 MED ORDER — LABETALOL HCL 5 MG/ML IV SOLN
10.0000 mg | Freq: Once | INTRAVENOUS | Status: AC
  Administered 2022-04-20: 10 mg via INTRAVENOUS
  Filled 2022-04-20: qty 4

## 2022-04-20 MED ORDER — CEFTRIAXONE SODIUM 1 G IJ SOLR
1.0000 g | Freq: Once | INTRAMUSCULAR | Status: AC
  Administered 2022-04-20: 1 g via INTRAVENOUS
  Filled 2022-04-20: qty 10

## 2022-04-20 MED ORDER — LACTATED RINGERS IV BOLUS
500.0000 mL | Freq: Once | INTRAVENOUS | Status: AC
  Administered 2022-04-20: 500 mL via INTRAVENOUS

## 2022-04-20 MED ORDER — DIPHENHYDRAMINE HCL 50 MG/ML IJ SOLN
50.0000 mg | Freq: Once | INTRAMUSCULAR | Status: AC
  Administered 2022-04-20: 50 mg via INTRAVENOUS
  Filled 2022-04-20: qty 1

## 2022-04-20 MED ORDER — HYDRALAZINE HCL 20 MG/ML IJ SOLN: 10.0000 mg | Freq: Once | INTRAMUSCULAR | Status: DC

## 2022-04-20 MED ORDER — LABETALOL HCL 5 MG/ML IV SOLN
20.0000 mg | Freq: Once | INTRAVENOUS | Status: AC
  Administered 2022-04-20: 20 mg via INTRAVENOUS
  Filled 2022-04-20: qty 4

## 2022-04-20 NOTE — ED Provider Notes (Signed)
Care of the patient received from [facility].  Please see her note for full HPI  In short, 56 year old female with a history of hypertension, diabetes, anxiety presented with multiple complaints including numbness to the left side of her face, dizziness, lightheadedness, shortness of breath, chest pain, nonbilious, nonbloody emesis and burning with urination.  Patient is a difficult historian and on my evaluation is not able to provide much of the history other than stating that her head and her abdomen hurt.  Her work-up prior to my evaluation revealed CMP with a baseline creatinine, CBC without leukocytosis, chest x-ray with new perihilar infiltrates right more than left, new.  Care of the patient received pending CT of the head and reevaluation.  I personally reviewed her CT of the head, agree with radiology reading.  No acute findings noted on CT of the head.  On my reevaluation, patient is arousable but will not elaborate on status of her symptoms.  She is nauseous and has an emesis bag next to her stating that she feels like she might throw up.  She was given an additional dose of Reglan and a 500 cc bolus of LR.   Approximately 30 to 40 minutes later, was called to bedside as the patient became more unresponsive, she has her eyes open, will respond and moan to pain but will not follow commands.  She does appear were diaphoretic and is hypertensive and tachycardic with a rate in the 150s.  She was given 50 mg of IV Benadryl for possible akathisia versus allergic reaction.  Her CBG was 218 at bedside.  Patient was also seen and evaluated by Dr. Adela Lank.  Originally had plans for CT PE study, and a contrasted study of the abdomen and pelvis, changed to CTA of the chest abdomen pelvis to rule out possible dissection given acute decompensation.  I personally reviewed and interpreted her CTA, no evidence of dissection, some perinephric stranding and cortical hypodensity at the upper pole of the right  kidney, question pyelonephritis versus infarct.  Per review, patient has the same findings on her CTA done several weeks ago.    Patient unable to provide a urine thus far.  Her son at bedside arrived, states that she does smoke marijuana but does not think she has smoked recently.  Mental status minimally improved.  She is also significantly hypertensive with a systolic blood pressure in the 200s, and persistently tachycardic with a rate in the 130s.  EKG again reviewed and consistent with sinus tach.  She was given 10 of labetalol and sublingual nitroglycerin x3 per discussion with Dr. Adela Lank.  We will give 1 dose of Rocephin.  MRI of the brain ordered, lactic acid and blood cultures ordered.  BP improved with administration of the above medications.  Consulted hospitalist team for admission.  Spoke w/ Lyda Perone MD who will see and evaluate the patient   Results for orders placed or performed during the hospital encounter of 04/20/22  CBC with Differential  Result Value Ref Range   WBC 10.3 4.0 - 10.5 K/uL   RBC 4.45 3.87 - 5.11 MIL/uL   Hemoglobin 13.1 12.0 - 15.0 g/dL   HCT 16.1 09.6 - 04.5 %   MCV 91.2 80.0 - 100.0 fL   MCH 29.4 26.0 - 34.0 pg   MCHC 32.3 30.0 - 36.0 g/dL   RDW 40.9 81.1 - 91.4 %   Platelets 276 150 - 400 K/uL   nRBC 0.0 0.0 - 0.2 %   Neutrophils  Relative % 67 %   Neutro Abs 6.9 1.7 - 7.7 K/uL   Lymphocytes Relative 28 %   Lymphs Abs 2.8 0.7 - 4.0 K/uL   Monocytes Relative 4 %   Monocytes Absolute 0.4 0.1 - 1.0 K/uL   Eosinophils Relative 0 %   Eosinophils Absolute 0.0 0.0 - 0.5 K/uL   Basophils Relative 0 %   Basophils Absolute 0.0 0.0 - 0.1 K/uL   Immature Granulocytes 1 %   Abs Immature Granulocytes 0.05 0.00 - 0.07 K/uL  Brain natriuretic peptide  Result Value Ref Range   B Natriuretic Peptide 243.7 (H) 0.0 - 100.0 pg/mL  Lipase, blood  Result Value Ref Range   Lipase 38 11 - 51 U/L  Comprehensive metabolic panel  Result Value Ref Range   Sodium  138 135 - 145 mmol/L   Potassium 3.5 3.5 - 5.1 mmol/L   Chloride 101 98 - 111 mmol/L   CO2 24 22 - 32 mmol/L   Glucose, Bld 200 (H) 70 - 99 mg/dL   BUN 14 6 - 20 mg/dL   Creatinine, Ser 4.09 (H) 0.44 - 1.00 mg/dL   Calcium 7.5 (L) 8.9 - 10.3 mg/dL   Total Protein 7.7 6.5 - 8.1 g/dL   Albumin 3.1 (L) 3.5 - 5.0 g/dL   AST 19 15 - 41 U/L   ALT 13 0 - 44 U/L   Alkaline Phosphatase 65 38 - 126 U/L   Total Bilirubin 0.4 0.3 - 1.2 mg/dL   GFR, Estimated 49 (L) >60 mL/min   Anion gap 13 5 - 15  I-stat chem 8, ED  Result Value Ref Range   Sodium 139 135 - 145 mmol/L   Potassium 3.7 3.5 - 5.1 mmol/L   Chloride 100 98 - 111 mmol/L   BUN 16 6 - 20 mg/dL   Creatinine, Ser 8.11 (H) 0.44 - 1.00 mg/dL   Glucose, Bld 914 (H) 70 - 99 mg/dL   Calcium, Ion 7.82 (LL) 1.15 - 1.40 mmol/L   TCO2 25 22 - 32 mmol/L   Hemoglobin 14.6 12.0 - 15.0 g/dL   HCT 95.6 21.3 - 08.6 %   Comment NOTIFIED PHYSICIAN   CBG monitoring, ED  Result Value Ref Range   Glucose-Capillary 179 (H) 70 - 99 mg/dL  CBG monitoring, ED  Result Value Ref Range   Glucose-Capillary 218 (H) 70 - 99 mg/dL  Troponin I (High Sensitivity)  Result Value Ref Range   Troponin I (High Sensitivity) 10 <18 ng/L  Troponin I (High Sensitivity)  Result Value Ref Range   Troponin I (High Sensitivity) 11 <18 ng/L   DG Chest 1 View  Result Date: 04/14/2022 CLINICAL DATA:  Chest pain and itching. EXAM: CHEST  1 VIEW COMPARISON:  10/04/2018. FINDINGS: The heart is enlarged the mediastinal contour is within normal limits. Subsegmental atelectasis or scarring is noted in the mid left lung. No consolidation, effusion, or pneumothorax. Cervical spinal fusion hardware is noted. Degenerative changes are present in the thoracic spine. IMPRESSION: 1. Mild subsegmental atelectasis or scarring in mid left lung. 2. Cardiomegaly. Electronically Signed   By: Thornell Sartorius M.D.   On: 04/14/2022 20:06   CT HEAD WO CONTRAST ( )  Result Date:  04/20/2022 CLINICAL DATA:  Nonspecific dizziness EXAM: CT HEAD WITHOUT CONTRAST TECHNIQUE: Contiguous axial images were obtained from the base of the skull through the vertex without intravenous contrast. RADIATION DOSE REDUCTION: This exam was performed according to the departmental dose-optimization program which includes automated exposure control,  adjustment of the mA and/or kV according to patient size and/or use of iterative reconstruction technique. COMPARISON:  01/20/2017 FINDINGS: Brain: No acute infarct or hemorrhage. Lateral ventricles and midline structures are unremarkable. No acute extra-axial fluid collections. No mass effect. Vascular: No hyperdense vessel or unexpected calcification. Skull: Normal. Negative for fracture or focal lesion. Sinuses/Orbits: No acute finding. Other: None. IMPRESSION: 1. No acute intracranial process.  Stable exam. Electronically Signed   By: Sharlet Salina M.D.   On: 04/20/2022 15:18   CARDIAC CATHETERIZATION  Result Date: 04/09/2022   Ost LAD to Prox LAD lesion is 50% stenosed.   Mid Cx lesion is 40% stenosed.   Dist Cx lesion is 50% stenosed. Life style modification for mild to moderate multivessel disease.   DG Chest Port 1 View  Result Date: 04/20/2022 CLINICAL DATA:  Pt poor historian. Per PA: Pt complains of shortness of breath. Participates minimally in history, vomiting (clear) in triage, upper abdominal discomfort. EXAM: PORTABLE CHEST - 1 VIEW COMPARISON:  04/14/2022 FINDINGS: Perihilar and bibasilar interstitial edema or infiltrates, worse on right than left, new since previous. Heart size and mediastinal contours are within normal limits. No effusion. Cervical fixation hardware noted. IMPRESSION: New asymmetric perihilar and bibasilar infiltrates or edema, right worse than left. Electronically Signed   By: Corlis Leak M.D.   On: 04/20/2022 14:09   VAS [place] ABI WITH/WO TBI  Result Date: 04/09/2022  LOWER EXTREMITY DOPPLER STUDY Patient Name:  KHAYA THEISSEN  Date of Exam:   04/08/2022 Medical Rec #: [id number]       Accession #:    [id number] Date of Birth: 02-21-1966       Patient Gender: F Patient Age:   64 years Exam Location:  [facility] Procedure:      VAS [place] ABI WITH/WO TBI Referring Phys: Lelon Mast RHYNE --------------------------------------------------------------------------------  Indications: Femoral artery occlusion, left. High Risk Factors: Hypertension, Diabetes.  Comparison Study: no prior Performing Technologist: [place] Ponder RVS  Examination Guidelines: A complete evaluation includes at minimum, Doppler waveform signals and systolic blood pressure reading at the level of bilateral brachial, anterior tibial, and posterior tibial arteries, when vessel segments are accessible. Bilateral testing is considered an integral part of a complete examination. Photoelectric Plethysmograph (PPG) waveforms and toe systolic pressure readings are included as required and additional duplex testing as needed. Limited examinations for reoccurring indications may be performed as noted.  ABI Findings: +---------+------------------+-----+---------+--------+ Right    Rt Pressure (mmHg)IndexWaveform Comment  +---------+------------------+-----+---------+--------+ Brachial 155                    triphasic         +---------+------------------+-----+---------+--------+ PTA      121               0.74 biphasic          +---------+------------------+-----+---------+--------+ DP       151               0.93 triphasic         +---------+------------------+-----+---------+--------+ Morton Amy               0.69 Abnormal          +---------+------------------+-----+---------+--------+ +---------+------------------+-----+-------------------+-----------------------+ Left     Lt Pressure (mmHg)IndexWaveform           Comment                 +---------+------------------+-----+-------------------+-----------------------+ Brachial 163  triphasic                                  +---------+------------------+-----+-------------------+-----------------------+ PTA      66                0.40 dampened monophasic                        +---------+------------------+-----+-------------------+-----------------------+ DP       64                0.39 dampened monophasic                        +---------+------------------+-----+-------------------+-----------------------+ Great Toe                                          unable to obtain                                                           pressure due to                                                            decreased great toe                                                        amplitude               +---------+------------------+-----+-------------------+-----------------------+ +-------+-----------+-----------+------------+------------+ ABI/TBIToday's ABIToday's TBIPrevious ABIPrevious TBI +-------+-----------+-----------+------------+------------+ Right  0.93       0.69                                +-------+-----------+-----------+------------+------------+ Left   0.40                                           +-------+-----------+-----------+------------+------------+   Summary: Right: Resting right ankle-brachial index indicates mild right lower extremity arterial disease. The right toe-brachial index is abnormal. Left: Resting left ankle-brachial index indicates severe left lower extremity arterial disease. *See table(s) above for measurements and observations.  Electronically signed by Heath Lark on 04/09/2022 at 7:26:35 AM.    Final    ECHOCARDIOGRAM COMPLETE  Result Date: 04/08/2022    ECHOCARDIOGRAM REPORT   Patient Name:   KIMALA HORNE Date of Exam: 04/08/2022 Medical Rec #:  [id number]      Height:       62.5 in Accession #:    [id number]     Weight:       207.9 lb Date of Birth:  May 28, 1966  BSA:          1.955 m Patient Age:    56 years       BP:           198/108 mmHg Patient Gender: F              HR:           96 bpm. Exam Location:  Inpatient Procedure: 2D Echo, Cardiac Doppler and Color Doppler Indications:    R07.9* Chest pain, unspecified  History:        Patient has no prior history of Echocardiogram examinations.                 Risk Factors:Hypertension and Diabetes.  Sonographer:    Eulah Pont RDCS Referring Phys: [phone] Meryle Ready [facility] IMPRESSIONS  1. Inferior Septal / apical hypokinesis . Left ventricular ejection fraction, by estimation, is 35 to 40%. The left ventricle has moderately decreased function. The left ventricle demonstrates regional wall motion abnormalities (see scoring diagram/findings for description). The left ventricular internal cavity size was mildly dilated. Left ventricular diastolic parameters were normal.  2. Right ventricular systolic function is normal. The right ventricular size is normal.  3. A small pericardial effusion is present. The pericardial effusion is posterior to the left ventricle and anterior to the right ventricle.  4. The mitral valve is abnormal. Trivial mitral valve regurgitation. No evidence of mitral stenosis.  5. The aortic valve was not well visualized. There is mild calcification of the aortic valve. There is mild thickening of the aortic valve. Aortic valve regurgitation is trivial. Aortic valve sclerosis is present, with no evidence of aortic valve stenosis.  6. The inferior vena cava is normal in size with greater than 50% respiratory variability, suggesting right atrial pressure of 3 mmHg. FINDINGS  Left Ventricle: Inferior Septal / apical hypokinesis. Left ventricular ejection fraction, by estimation, is 35 to 40%. The left ventricle has moderately decreased function. The left ventricle demonstrates regional wall motion abnormalities. The left ventricular internal cavity size was mildly dilated. There is no left ventricular  hypertrophy. Left ventricular diastolic parameters were normal. Right Ventricle: The right ventricular size is normal. No increase in right ventricular wall thickness. Right ventricular systolic function is normal. Left Atrium: Left atrial size was normal in size. Right Atrium: Right atrial size was normal in size. Pericardium: A small pericardial effusion is present. The pericardial effusion is posterior to the left ventricle and anterior to the right ventricle. Mitral Valve: The mitral valve is abnormal. There is mild thickening of the mitral valve leaflet(s). There is mild calcification of the mitral valve leaflet(s). Mild mitral annular calcification. Trivial mitral valve regurgitation. No evidence of mitral valve stenosis. Tricuspid Valve: The tricuspid valve is normal in structure. Tricuspid valve regurgitation is trivial. No evidence of tricuspid stenosis. Aortic Valve: The aortic valve was not well visualized. There is mild calcification of the aortic valve. There is mild thickening of the aortic valve. Aortic valve regurgitation is trivial. Aortic regurgitation PHT measures 481 msec. Aortic valve sclerosis is present, with no evidence of aortic valve stenosis. Pulmonic Valve: The pulmonic valve was normal in structure. Pulmonic valve regurgitation is not visualized. No evidence of pulmonic stenosis. Aorta: The aortic root is normal in size and structure. Venous: The inferior vena cava is normal in size with greater than 50% respiratory variability, suggesting right atrial pressure of 3 mmHg. IAS/Shunts: No atrial level shunt detected by color flow Doppler.  LEFT VENTRICLE PLAX 2D  LVIDd:         4.70 cm      Diastology LVIDs:         3.90 cm      LV e' medial:    5.93 cm/s LV PW:         1.10 cm      LV E/e' medial:  12.5 LV IVS:        1.00 cm      LV e' lateral:   6.68 cm/s LVOT diam:     2.00 cm      LV E/e' lateral: 11.1 LV SV:         60 LV SV Index:   31 LVOT Area:     3.14 cm  LV Volumes (MOD) LV vol  d, MOD A2C: 126.0 ml LV vol d, MOD A4C: 93.4 ml LV vol s, MOD A2C: 75.2 ml LV vol s, MOD A4C: 48.9 ml LV SV MOD A2C:     50.8 ml LV SV MOD A4C:     93.4 ml LV SV MOD BP:      46.4 ml RIGHT VENTRICLE RV S prime:     11.90 cm/s TAPSE (M-mode): 1.7 cm LEFT ATRIUM             Index        RIGHT ATRIUM          Index LA diam:        2.70 cm 1.38 cm/m   RA Area:     9.28 cm LA Vol (A2C):   38.6 ml 19.74 ml/m  RA Volume:   17.80 ml 9.10 ml/m LA Vol (A4C):   31.5 ml 16.11 ml/m LA Biplane Vol: 36.0 ml 18.41 ml/m  AORTIC VALVE LVOT Vmax:         120.00 cm/s LVOT Vmean:        79.900 cm/s LVOT VTI:          0.190 m AI PHT:            481 msec AR Vena Contracta: 0.20 cm  AORTA Ao Root diam: 3.20 cm Ao Asc diam:  3.60 cm MITRAL VALVE MV Area (PHT): 5.13 cm    SHUNTS MV Decel Time: 148 msec    Systemic VTI:  0.19 m MV E velocity: 74.10 cm/s  Systemic Diam: 2.00 cm MV A velocity: 61.30 cm/s MV E/A ratio:  1.21 Charlton HawsPeter Nishan MD Electronically signed by Charlton HawsPeter Nishan MD Signature Date/Time: 04/08/2022/1:51:06 PM    Final    CT Angio Chest/Abd/Pel for Dissection W and/or Wo Contrast  Result Date: 04/20/2022 CLINICAL DATA:  Epigastric discomfort with nausea, vomiting, and shortness of breath. Hypertensive. EXAM: CT ANGIOGRAPHY CHEST, ABDOMEN AND PELVIS TECHNIQUE: Non-contrast CT of the chest was initially obtained. Multidetector CT imaging through the chest, abdomen and pelvis was performed using the standard protocol during bolus administration of intravenous contrast. Multiplanar reconstructed images and MIPs were obtained and reviewed to evaluate the vascular anatomy. RADIATION DOSE REDUCTION: This exam was performed according to the departmental dose-optimization program which includes automated exposure control, adjustment of the mA and/or kV according to patient size and/or use of iterative reconstruction technique. CONTRAST:  100mL OMNIPAQUE IOHEXOL 350 MG/ML SOLN COMPARISON:  CT chest, abdomen, and pelvis dated April 08, 2022. FINDINGS: CTA CHEST FINDINGS Cardiovascular: Preferential opacification of the thoracic aorta. No evidence of thoracic aortic aneurysm or dissection. Normal heart size. Unchanged small pericardial effusion. No central pulmonary embolism. Mediastinum/Nodes: No enlarged mediastinal, hilar,  or axillary lymph nodes. Thyroid gland, trachea, and esophagus demonstrate no significant findings. Lungs/Pleura: Minimal subsegmental atelectasis at both lung bases. No focal consolidation, pleural effusion, or pneumothorax. Musculoskeletal: Chronic left breast seroma is unchanged, presumably postsurgical given history of prior lumpectomy. No acute or significant osseous findings. Review of the MIP images confirms the above findings. CTA ABDOMEN AND PELVIS FINDINGS VASCULAR Aorta: Normal caliber aorta without aneurysm, dissection, vasculitis or significant stenosis. Unchanged calcified and noncalcified atherosclerotic plaque. Celiac: Patent with similar mild stenosis of the proximal artery. No evidence of aneurysm, dissection, or vasculitis. SMA: Patent without evidence of aneurysm, dissection, vasculitis or significant stenosis. Renals: Unchanged severe stenosis of the right renal artery origin. The left renal artery is unremarkable. IMA: Occluded. 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 Veins: No obvious venous abnormality within the limitations of this arterial phase study. Review of the MIP images confirms the above findings. NON-VASCULAR Hepatobiliary: No focal liver abnormality is seen. No gallstones, gallbladder wall thickening, or biliary dilatation. Pancreas: Unremarkable. No pancreatic ductal dilatation or surrounding inflammatory changes. Spleen: Normal in size without focal abnormality. Adrenals/Urinary Tract: Adrenal glands are unremarkable. Similar patchy cortical hypodensity in the upper pole of the right kidney with increasing small amount of perinephric fluid.  The left kidney is unremarkable. No hydronephrosis. Bladder is unremarkable. Stomach/Bowel: Unchanged small hiatal hernia. The stomach is otherwise within normal limits. No bowel wall thickening, distention, or surrounding inflammatory changes. Normal appendix. Lymphatic: No enlarged abdominal or pelvic lymph nodes. Reproductive: Status post hysterectomy. No adnexal masses. Other: No abdominal wall hernia or abnormality. No abdominopelvic ascites. No pneumoperitoneum. Musculoskeletal: No acute or significant osseous findings. Chronic bilateral sacroiliitis. Review of the MIP images confirms the above findings. IMPRESSION: 1. No evidence of acute aortic syndrome or thoracoabdominal aortic aneurysm. 2. Similar patchy cortical hypodensity in the upper pole of the right kidney with increasing small amount of perinephric fluid, concerning for pyelonephritis versus infarct. 3. Unchanged severe stenosis of the right renal artery origin. 4. Chronically occluded IMA, left internal iliac artery, and proximal left SFA. 5. Unchanged small pericardial effusion. Electronically Signed   By: Obie Dredge M.D.   On: 04/20/2022 17:39   CT Angio Chest/Abd/Pel for Dissection W and/or Wo Contrast  Result Date: 04/08/2022 CLINICAL DATA:  Chest pain and back pain. EXAM: CT ANGIOGRAPHY CHEST, ABDOMEN AND PELVIS TECHNIQUE: Non-contrast CT of the chest was initially obtained. Multidetector CT imaging through the chest, abdomen and pelvis was performed using the standard protocol during bolus administration of intravenous contrast. Multiplanar reconstructed images and MIPs were obtained and reviewed to evaluate the vascular anatomy. RADIATION DOSE REDUCTION: This exam was performed according to the departmental dose-optimization program which includes automated exposure control, adjustment of the mA and/or kV according to patient size and/or use of iterative reconstruction technique. CONTRAST:  OMNIPAQUE IOHEXOL 350 MG/ML SOLN  COMPARISON:  None. FINDINGS: CTA CHEST FINDINGS Cardiovascular: Preferential opacification of the thoracic aorta. No evidence of thoracic aortic aneurysm or dissection. Normal heart size. There is a small pericardial effusion. Mediastinum/Nodes: No enlarged mediastinal, hilar, or axillary lymph nodes. Thyroid gland, trachea, and esophagus demonstrate no significant findings. Lungs/Pleura: There is minimal atelectasis in the inferior left upper lobe. The lungs are otherwise clear. No pleural effusion or pneumothorax identified. Musculoskeletal: No acute fractures are identified. There is an ill-defined low attenuation collection in the left breast measuring approximately 4.0 x 7.4 x 3.0 cm. There is no surrounding enhancement. Review of the MIP images confirms  the above findings. CTA ABDOMEN AND PELVIS FINDINGS VASCULAR Aorta: Normal caliber aorta without aneurysm, dissection, vasculitis or significant stenosis. There is severe atherosclerotic disease throughout the aorta. There ulcerated plaques in the proximal abdominal aorta. There is also calcified atherosclerotic disease. Celiac: Patent without evidence of aneurysm, dissection, vasculitis or significant stenosis. SMA: Patent without evidence of aneurysm, dissection, vasculitis or significant stenosis. Renals: Severe focal stenosis origin right renal artery. Left renal artery within normal limits. IMA: Not seen. Inflow: Patent without evidence of aneurysm, dissection, vasculitis or significant stenosis. There is occlusion of the visualized proximal left femoral artery. Veins: No obvious venous abnormality within the limitations of this arterial phase study. Review of the MIP images confirms the above findings. NON-VASCULAR Hepatobiliary: No focal liver abnormality is seen. No gallstones, gallbladder wall thickening, or biliary dilatation. Pancreas: Unremarkable. No pancreatic ductal dilatation or surrounding inflammatory changes. Spleen: Normal in size without  focal abnormality. Adrenals/Urinary Tract: There is some patchy areas of cortical hypodensity in the superior pole the right kidney. There is no hydronephrosis or perinephric fluid. Adrenal glands and bladder are within normal limits. Stomach/Bowel: Stomach is within normal limits. Appendix appears normal. No evidence of bowel wall thickening, distention, or inflammatory changes. Lymphatic: No enlarged lymph nodes are seen. Reproductive: Status post hysterectomy. No adnexal masses. Other: No abdominal wall hernia or abnormality. No abdominopelvic ascites. Musculoskeletal: No acute or significant osseous findings. Review of the MIP images confirms the above findings. IMPRESSION: 1. No evidence for aortic dissection or aneurysm. 2. Severe atherosclerotic disease of the abdominal aorta with noncalcified plaque and ulcerated plaque. 3. 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 4. Severe focal stenosis origin of the right renal artery. 5. IMA not visualized. 6. Patchy hypodensity superior pole the right kidney, indeterminate. Findings may represent pyelonephritis or infarct. Other focal lesion can not be excluded. Consider follow-up evaluation with dedicated renal CT or MRI. 7. Small pericardial effusion. 8. Low-density collection in the left breast, indeterminate. Findings may be related to fluid collection or resolving hematoma. Recommend clinical correlation. Consider ultrasound. Electronically Signed   By: Darliss Cheney M.D.   On: 04/08/2022 00:53   VAS [place] LOWER EXTREMITY VENOUS (DVT)  Result Date: 04/09/2022  Lower Venous DVT Study Patient Name:  WAHNETA DEROCHER  Date of Exam:   04/09/2022 Medical Rec #: [id number]       Accession #:    [id number] Date of Birth: [birth year]       Patient Gender: F Patient Age:   70 years Exam Location:  [facility] Procedure:      VAS [place] LOWER EXTREMITY VENOUS (DVT) Referring Phys: DAWOOD ELGERGAWY  --------------------------------------------------------------------------------  Indications: D-dimer 1.87.  Comparison Study: No prior venous studies. Left femoral artery occlusion on CTA Performing Technologist: Jean Rosenthal RDMS, RVT  Examination Guidelines: A complete evaluation includes B-mode imaging, spectral Doppler, color Doppler, and power Doppler as needed of all accessible portions of each vessel. Bilateral testing is considered an integral part of a complete examination. Limited examinations for reoccurring indications may be performed as noted. The reflux portion of the exam is performed with the patient in reverse Trendelenburg.  +---------+---------------+---------+-----------+----------+--------------+ RIGHT    CompressibilityPhasicitySpontaneityPropertiesThrombus Aging +---------+---------------+---------+-----------+----------+--------------+ CFV      Full           Yes      Yes                                 +---------+---------------+---------+-----------+----------+--------------+  SFJ      Full                                                        +---------+---------------+---------+-----------+----------+--------------+ FV Prox  Full                                                        +---------+---------------+---------+-----------+----------+--------------+ FV Mid   Full                                                        +---------+---------------+---------+-----------+----------+--------------+ FV DistalFull                                                        +---------+---------------+---------+-----------+----------+--------------+ PFV      Full                                                        +---------+---------------+---------+-----------+----------+--------------+ POP      Full           Yes      Yes                                 +---------+---------------+---------+-----------+----------+--------------+  PTV      Full                                                        +---------+---------------+---------+-----------+----------+--------------+ PERO     Full                                                        +---------+---------------+---------+-----------+----------+--------------+ Gastroc  Full                                                        +---------+---------------+---------+-----------+----------+--------------+   +---------+---------------+---------+-----------+----------+--------------+ LEFT     CompressibilityPhasicitySpontaneityPropertiesThrombus Aging +---------+---------------+---------+-----------+----------+--------------+ CFV      Full           Yes      Yes                                 +---------+---------------+---------+-----------+----------+--------------+  SFJ      Full                                                        +---------+---------------+---------+-----------+----------+--------------+ FV Prox  Full                                                        +---------+---------------+---------+-----------+----------+--------------+ FV Mid   Full                                                        +---------+---------------+---------+-----------+----------+--------------+ FV DistalFull                                                        +---------+---------------+---------+-----------+----------+--------------+ PFV      Full                                                        +---------+---------------+---------+-----------+----------+--------------+ POP      Full           Yes      Yes                                 +---------+---------------+---------+-----------+----------+--------------+ PTV      Full                                                        +---------+---------------+---------+-----------+----------+--------------+ PERO     Full                                                         +---------+---------------+---------+-----------+----------+--------------+ Gastroc  Full                                                        +---------+---------------+---------+-----------+----------+--------------+     Summary: RIGHT: - There is no evidence of deep vein thrombosis in the lower extremity.  - No cystic structure found in the popliteal fossa.  LEFT: - There is no evidence of deep vein thrombosis in the lower extremity.  - No cystic structure found in the popliteal  fossa.  *See table(s) above for measurements and observations. Electronically signed by Sherald Hess MD on 04/09/2022 at 8:47:30 PM.    Final        Mare Ferrari, PA-C 04/20/22 2332    Melene Plan, DO 04/21/22 1500

## 2022-04-20 NOTE — ED Provider Triage Note (Signed)
Emergency Medicine Provider Triage Evaluation Note ? ?Erin Jackson , a 56 y.o. female  was evaluated in triage.  Pt complains of shortness of breath. Participates minimally in history, vomiting (clear) in triage, upper abdominal discomfort.  ? ?Review of Systems  ?Positive: Vomiting, SHOB ?Negative:  ? ?Physical Exam  ?BP (!) 150/114 (BP Location: Right Arm)   Pulse (!) 113   Temp 98.4 ?F (36.9 ?C)   Resp 15   SpO2 98%  ?Gen:   Awake, appears to feel unwell, actively vomiting in triage recently given zofran ?Resp:  Tachypenic  ?MSK:   Moves extremities without difficulty  ?Other:   ? ?Medical Decision Making  ?Medically screening exam initiated at 12:00 PM.  Appropriate orders placed.  JESSA STINSON was informed that the remainder of the evaluation will be completed by another provider, this initial triage assessment does not replace that evaluation, and the importance of remaining in the ED until their evaluation is complete. ? ? ?  ?Jeannie Fend, PA-C ?04/20/22 1202 ? ?

## 2022-04-20 NOTE — ED Notes (Signed)
Patient transported to CT 

## 2022-04-20 NOTE — ED Provider Notes (Signed)
[facility] EMERGENCY DEPARTMENT Provider Note   CSN: [id number] Arrival date & time: 04/20/22  1138     History  Chief Complaint  Patient presents with   Shortness of Breath   Nausea   Weakness   Emesis    Erin Jackson is a 56 y.o. female.  56 y.o female with a PMH of HTN, DM, Anxiety, GERD presents to the ED via EMS with multiple chief complaints. She endorses numbness to the left side of her face that has been ongoing for 2 days. She also endorses feeling dizzy and lightheaded whenever she is walking. She has had multiple episodes of non bilious non bloody emesis, and reports feeling short of breath when this occurs. In addition patient is endorsing burning with urination that has been ongoing for a couple of days. She is a very poor historian providing very little information on reason for her visit.   The history is provided by the patient.  Shortness of Breath Severity:  Mild Associated symptoms: vomiting   Associated symptoms: no abdominal pain, no chest pain, no fever and no sore throat   Weakness Severity:  Moderate Onset quality:  Gradual Duration:  2 days Timing:  Constant Progression:  Unchanged Chronicity:  New Context: not drug use, not recent infection, not stress and not urinary tract infection   Associated symptoms: dysuria, nausea, shortness of breath and vomiting   Associated symptoms: no abdominal pain, no chest pain, no diarrhea and no fever   Emesis Associated symptoms: no abdominal pain, no chills, no diarrhea, no fever and no sore throat       Home Medications Prior to Admission medications   Medication Sig Start Date End Date Taking? Authorizing Provider  acetaminophen (TYLENOL) 500 MG tablet Take 500 mg by mouth every 6 (six) hours as needed for headache.    [provider]  albuterol (VENTOLIN HFA) 108 (90 Base) MCG/ACT inhaler Inhale 2 puffs into the lungs every 6 (six) hours as needed for wheezing or shortness of  breath.    [provider]  alprazolam Duanne Moron) 2 MG tablet Take 0.5 tablets (1 mg total) by mouth 2 (two) times daily as needed for anxiety. 04/10/22   Elgergawy, Silver Huguenin, MD  atorvastatin (LIPITOR) 80 MG tablet Take 1 tablet (80 mg total) by mouth daily. 04/11/22   Elgergawy, Silver Huguenin, MD  cetirizine (ZYRTEC ALLERGY) 10 MG tablet Take 1 tablet (10 mg total) by mouth daily. 04/14/22   Valarie Merino, MD  cloNIDine (CATAPRES) 0.2 MG tablet Take 1 tablet (0.2 mg total) by mouth 2 (two) times daily. 04/10/22   Elgergawy, Silver Huguenin, MD  clopidogrel (PLAVIX) 75 MG tablet Take 1 tablet (75 mg total) by mouth daily. 04/10/22   Elgergawy, Silver Huguenin, MD  EPINEPHRINE 0.3 mg/0.3 mL IJ SOAJ injection Inject 0.3 mLs (0.3 mg total) into the muscle once as needed (allergic reaction). Patient taking differently: Inject 0.3 mg into the muscle as needed for anaphylaxis (allergic reaction). 10/04/18   Fabian November, MD  HYDROcodone-acetaminophen (NORCO) 10-325 MG tablet Take 1 tablet by mouth in the morning and at bedtime. 03/20/22   [provider]  losartan (COZAAR) 25 MG tablet Take 1 tablet (25 mg total) by mouth daily. 04/11/22   Elgergawy, Silver Huguenin, MD  metFORMIN (GLUCOPHAGE) 500 MG tablet Take 1 tablet (500 mg total) by mouth 2 (two) times daily with a meal. 04/12/22   Elgergawy, Silver Huguenin, MD  metoprolol tartrate (LOPRESSOR) 50 MG tablet  Take 1 tablet (50 mg total) by mouth 2 (two) times daily. 04/10/22   Elgergawy, Silver Huguenin, MD  NARCAN 4 MG/0.1ML LIQD nasal spray kit Place 0.4 mg into the nose as needed (opoid overdose). 01/08/20   [provider]  nicotine (NICODERM CQ - DOSED IN MG/24 HOURS) 14 mg/24hr patch Place 1 patch (14 mg total) onto the skin daily. 04/11/22   Elgergawy, Silver Huguenin, MD  nitroGLYCERIN (NITROSTAT) 0.4 MG SL tablet Place 1 tablet (0.4 mg total) under the tongue every 5 (five) minutes as needed for chest pain. 04/10/22   Elgergawy, Silver Huguenin, MD  omeprazole (PRILOSEC) 40 MG  capsule Take 40 mg by mouth in the morning and at bedtime.    [provider]  pantoprazole (PROTONIX) 40 MG tablet Take 1 tablet (40 mg total) by mouth daily. 04/11/22   Elgergawy, Silver Huguenin, MD  predniSONE (STERAPRED UNI-PAK 21 TAB) 10 MG (21) TBPK tablet Use per package instructions Patient taking differently: Take 10-60 mg by mouth as directed. Take 6 tablets on Day 1 Take 5 tablets on Day 2 Take 4 tablets on Day 3 Take 3 tablets on Day 4 Take 2 tablets on Day 5 Take 1 tablets on Day 6  Use per package instructions 04/10/22   Elgergawy, Silver Huguenin, MD  traZODone (DESYREL) 150 MG tablet Take 150 mg by mouth at bedtime.    [provider]  XTAMPZA ER 13.5 MG C12A Take 1 capsule by mouth 2 (two) times daily as needed (pain). 03/20/22   [provider]      Allergies    Citrus, Cucumber extract, Molds & smuts, Norvasc [amlodipine], Apple juice, Other, Aspirin, Ibuprofen, and Niacin and related    Review of Systems   Review of Systems  Constitutional:  Negative for chills and fever.  HENT:  Negative for sore throat.   Respiratory:  Positive for shortness of breath.   Cardiovascular:  Negative for chest pain.  Gastrointestinal:  Positive for nausea and vomiting. Negative for abdominal pain and diarrhea.  Genitourinary:  Positive for dysuria. Negative for flank pain.  Skin:  Negative for pallor and wound.  Neurological:  Positive for weakness, light-headedness and numbness.  All other systems reviewed and are negative.  Physical Exam Updated Vital Signs BP (!) 145/88   Pulse (!) 107   Temp 99.2 F (37.3 C) (Rectal)   Resp (!) 24   SpO2 98%  Physical Exam Vitals and nursing note reviewed.  Constitutional:      Appearance: She is not ill-appearing.  HENT:     Head: Normocephalic and atraumatic.  Cardiovascular:     Rate and Rhythm: Tachycardia present.  Pulmonary:     Effort: Pulmonary effort is normal.     Breath sounds: No decreased breath sounds or  wheezing.  Chest:     Chest wall: No tenderness.  Abdominal:     Palpations: Abdomen is soft.     Tenderness: There is abdominal tenderness.  Musculoskeletal:     Cervical back: Normal range of motion and neck supple.     Right lower leg: No edema.     Left lower leg: No edema.  Skin:    General: Skin is warm and dry.  Neurological:     Mental Status: She is alert and oriented to person, place, and time.    ED Results / Procedures / Treatments   Labs (all labs ordered are listed, but only abnormal results are displayed) Labs Reviewed  BRAIN NATRIURETIC PEPTIDE -  Abnormal; Notable for the following components:      Result Value   B Natriuretic Peptide 243.7 (*)    All other components within normal limits  COMPREHENSIVE METABOLIC PANEL - Abnormal; Notable for the following components:   Glucose, Bld 200 (*)    Creatinine, Ser 1.28 (*)    Calcium 7.5 (*)    Albumin 3.1 (*)    GFR, Estimated 49 (*)    All other components within normal limits  RAPID URINE DRUG SCREEN, HOSP PERFORMED - Abnormal; Notable for the following components:   Opiates POSITIVE (*)    Tetrahydrocannabinol POSITIVE (*)    All other components within normal limits  LACTIC ACID, PLASMA - Abnormal; Notable for the following components:   Lactic Acid, Venous 2.0 (*)    All other components within normal limits  URINALYSIS, ROUTINE W REFLEX MICROSCOPIC - Abnormal; Notable for the following components:   APPearance HAZY (*)    Specific Gravity, Urine 1.044 (*)    Glucose, UA 150 (*)    Hgb urine dipstick MODERATE (*)    Ketones, ur 20 (*)    Protein, ur >=300 (*)    All other components within normal limits  BASIC METABOLIC PANEL - Abnormal; Notable for the following components:   CO2 17 (*)    Glucose, Bld 322 (*)    Creatinine, Ser 1.32 (*)    Calcium 7.2 (*)    GFR, Estimated 47 (*)    Anion gap 20 (*)    All other components within normal limits  MAGNESIUM - Abnormal; Notable for the following  components:   Magnesium <0.5 (*)    All other components within normal limits  PHOSPHORUS - Abnormal; Notable for the following components:   Phosphorus 5.1 (*)    All other components within normal limits  LACTIC ACID, PLASMA - Abnormal; Notable for the following components:   Lactic Acid, Venous 2.0 (*)    All other components within normal limits  CBC - Abnormal; Notable for the following components:   WBC 17.7 (*)    All other components within normal limits  GLUCOSE, CAPILLARY - Abnormal; Notable for the following components:   Glucose-Capillary 302 (*)    All other components within normal limits  I-STAT CHEM 8, ED - Abnormal; Notable for the following components:   Creatinine, Ser 1.10 (*)    Glucose, Bld 207 (*)    Calcium, Ion 0.88 (*)    All other components within normal limits  CBG MONITORING, ED - Abnormal; Notable for the following components:   Glucose-Capillary 179 (*)    All other components within normal limits  CBG MONITORING, ED - Abnormal; Notable for the following components:   Glucose-Capillary 218 (*)    All other components within normal limits  CBG MONITORING, ED - Abnormal; Notable for the following components:   Glucose-Capillary 256 (*)    All other components within normal limits  CBG MONITORING, ED - Abnormal; Notable for the following components:   Glucose-Capillary 292 (*)    All other components within normal limits  TROPONIN I (HIGH SENSITIVITY) - Abnormal; Notable for the following components:   Troponin I (High Sensitivity) 21 (*)    All other components within normal limits  RESP PANEL BY RT-PCR (FLU A&B, COVID) ARPGX2  MRSA NEXT GEN BY PCR, NASAL  CULTURE, BLOOD (ROUTINE X 2)  CULTURE, BLOOD (ROUTINE X 2)  CBC WITH DIFFERENTIAL/PLATELET  LIPASE, BLOOD  LACTIC ACID, PLASMA  TSH  URINALYSIS, ROUTINE  W REFLEX MICROSCOPIC  MAGNESIUM  TROPONIN I (HIGH SENSITIVITY)  TROPONIN I (HIGH SENSITIVITY)    EKG EKG  Interpretation  Date/Time:  Monday Apr 20 2022 11:59:18 EDT Ventricular Rate:  111 PR Interval:  124 QRS Duration: 80 QT Interval:  330 QTC Calculation: 448 R Axis:   -8 Text Interpretation: Sinus tachycardia Minimal voltage criteria for LVH, may be normal variant ( R in aVL ) Abnormal QRS-T angle, consider primary T wave abnormality Abnormal ECG When compared with ECG of 14-Apr-2022 20:31, PREVIOUS ECG IS PRESENT Confirmed by Regan Lemming ([id number]) on 04/20/2022 2:28:05 PM  Radiology CT HEAD WO CONTRAST (5MM)  Result Date: 04/20/2022 CLINICAL DATA:  Nonspecific dizziness EXAM: CT HEAD WITHOUT CONTRAST TECHNIQUE: Contiguous axial images were obtained from the base of the skull through the vertex without intravenous contrast. RADIATION DOSE REDUCTION: This exam was performed according to the departmental dose-optimization program which includes automated exposure control, adjustment of the mA and/or kV according to patient size and/or use of iterative reconstruction technique. COMPARISON:  01/20/2017 FINDINGS: Brain: No acute infarct or hemorrhage. Lateral ventricles and midline structures are unremarkable. No acute extra-axial fluid collections. No mass effect. Vascular: No hyperdense vessel or unexpected calcification. Skull: Normal. Negative for fracture or focal lesion. Sinuses/Orbits: No acute finding. Other: None. IMPRESSION: 1. No acute intracranial process.  Stable exam. Electronically Signed   By: Randa Ngo M.D.   On: 04/20/2022 15:18   DG Chest Port 1 View  Result Date: 04/20/2022 CLINICAL DATA:  Pt poor historian. Per PA: Pt complains of shortness of breath. Participates minimally in history, vomiting (clear) in triage, upper abdominal discomfort. EXAM: PORTABLE CHEST - 1 VIEW COMPARISON:  04/14/2022 FINDINGS: Perihilar and bibasilar interstitial edema or infiltrates, worse on right than left, new since previous. Heart size and mediastinal contours are within normal limits. No effusion.  Cervical fixation hardware noted. IMPRESSION: New asymmetric perihilar and bibasilar infiltrates or edema, right worse than left. Electronically Signed   By: Lucrezia Europe M.D.   On: 04/20/2022 14:09   CT Angio Chest/Abd/Pel for Dissection W and/or Wo Contrast  Result Date: 04/20/2022 CLINICAL DATA:  Epigastric discomfort with nausea, vomiting, and shortness of breath. Hypertensive. EXAM: CT ANGIOGRAPHY CHEST, ABDOMEN AND PELVIS TECHNIQUE: Non-contrast CT of the chest was initially obtained. Multidetector CT imaging through the chest, abdomen and pelvis was performed using the standard protocol during bolus administration of intravenous contrast. Multiplanar reconstructed images and MIPs were obtained and reviewed to evaluate the vascular anatomy. RADIATION DOSE REDUCTION: This exam was performed according to the departmental dose-optimization program which includes automated exposure control, adjustment of the mA and/or kV according to patient size and/or use of iterative reconstruction technique. CONTRAST:  155m OMNIPAQUE IOHEXOL 350 MG/ML SOLN COMPARISON:  CT chest, abdomen, and pelvis dated April 08, 2022. FINDINGS: CTA CHEST FINDINGS Cardiovascular: Preferential opacification of the thoracic aorta. No evidence of thoracic aortic aneurysm or dissection. Normal heart size. Unchanged small pericardial effusion. No central pulmonary embolism. Mediastinum/Nodes: No enlarged mediastinal, hilar, or axillary lymph nodes. Thyroid gland, trachea, and esophagus demonstrate no significant findings. Lungs/Pleura: Minimal subsegmental atelectasis at both lung bases. No focal consolidation, pleural effusion, or pneumothorax. Musculoskeletal: Chronic left breast seroma is unchanged, presumably postsurgical given history of prior lumpectomy. No acute or significant osseous findings. Review of the MIP images confirms the above findings. CTA ABDOMEN AND PELVIS FINDINGS VASCULAR Aorta: Normal caliber aorta without aneurysm,  dissection, vasculitis or significant stenosis. Unchanged calcified and noncalcified atherosclerotic plaque. Celiac: Patent with similar  mild stenosis of the proximal artery. No evidence of aneurysm, dissection, or vasculitis. SMA: Patent without evidence of aneurysm, dissection, vasculitis or significant stenosis. Renals: Unchanged severe stenosis of the right renal artery origin. The left renal artery is unremarkable. IMA: Occluded. 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 Veins: No obvious venous abnormality within the limitations of this arterial phase study. Review of the MIP images confirms the above findings. NON-VASCULAR Hepatobiliary: No focal liver abnormality is seen. No gallstones, gallbladder wall thickening, or biliary dilatation. Pancreas: Unremarkable. No pancreatic ductal dilatation or surrounding inflammatory changes. Spleen: Normal in size without focal abnormality. Adrenals/Urinary Tract: Adrenal glands are unremarkable. Similar patchy cortical hypodensity in the upper pole of the right kidney with increasing small amount of perinephric fluid. The left kidney is unremarkable. No hydronephrosis. Bladder is unremarkable. Stomach/Bowel: Unchanged small hiatal hernia. The stomach is otherwise within normal limits. No bowel wall thickening, distention, or surrounding inflammatory changes. Normal appendix. Lymphatic: No enlarged abdominal or pelvic lymph nodes. Reproductive: Status post hysterectomy. No adnexal masses. Other: No abdominal wall hernia or abnormality. No abdominopelvic ascites. No pneumoperitoneum. Musculoskeletal: No acute or significant osseous findings. Chronic bilateral sacroiliitis. Review of the MIP images confirms the above findings. IMPRESSION: 1. No evidence of acute aortic syndrome or thoracoabdominal aortic aneurysm. 2. Similar patchy cortical hypodensity in the upper pole of the right kidney with increasing small amount of  perinephric fluid, concerning for pyelonephritis versus infarct. 3. Unchanged severe stenosis of the right renal artery origin. 4. Chronically occluded IMA, left internal iliac artery, and proximal left SFA. 5. Unchanged small pericardial effusion. Electronically Signed   By: Titus Dubin M.D.   On: 04/20/2022 17:39    Procedures Procedures    Medications Ordered in ED Medications  nitroGLYCERIN (NITROSTAT) SL tablet 0.4 mg (0.4 mg Sublingual Given 04/20/22 1910)  nicardipine (CARDENE) 16m in 0.86% saline 2057mIV infusion (0.1 mg/ml) (5 mg/hr Intravenous Infusion Verify 04/21/22 0600)  metoprolol tartrate (LOPRESSOR) injection 2.5-5 mg (5 mg Intravenous Given 04/21/22 0316)  heparin injection 5,000 Units (5,000 Units Subcutaneous Given 04/21/22 0527)  docusate sodium (COLACE) capsule 100 mg (has no administration in time range)  polyethylene glycol (MIRALAX / GLYCOLAX) packet 17 g (has no administration in time range)  insulin aspart (novoLOG) injection 0-15 Units (0 Units Subcutaneous Duplicate 5/2/9/7948921 ondansetron (ZOFRAN) injection 4 mg (4 mg Intravenous Given 04/21/22 0305)  Chlorhexidine Gluconate Cloth 2 % PADS 6 each (has no administration in time range)  0.9 %  sodium chloride infusion ( Intravenous Infusion Verify 04/21/22 0600)  levETIRAcetam (KEPPRA) IVPB 1000 mg/100 mL premix (has no administration in time range)  magnesium sulfate IVPB 4 g 100 mL ( Intravenous Infusion Verify 04/21/22 0600)    Followed by  magnesium sulfate IVPB 2 g 50 mL (has no administration in time range)  LORazepam (ATIVAN) injection 1-2 mg (has no administration in time range)  promethazine (PHENERGAN) 12.5 mg in sodium chloride 0.9 % 50 mL IVPB (has no administration in time range)  sodium bicarbonate 150 mEq in sterile water 1,150 mL infusion ( Intravenous New Bag/Given 04/21/22 0615)  ondansetron (ZOFRAN) injection 4 mg (4 mg Intravenous Given 04/20/22 1447)  lactated ringers bolus 500 mL (0 mLs Intravenous  Stopped 04/20/22 1838)  metoCLOPramide (REGLAN) injection 5 mg (5 mg Intravenous Given 04/20/22 1615)  diphenhydrAMINE (BENADRYL) injection 50 mg (50 mg Intravenous Given 04/20/22 1646)  morphine (PF) 4 MG/ML injection 4 mg (4 mg Intravenous Given 04/20/22 1809)  iohexol (OMNIPAQUE) 350 MG/ML injection 100 mL (100 mLs Intravenous Contrast Given 04/20/22 1703)  cefTRIAXone (ROCEPHIN) 1 g in sodium chloride 0.9 % 100 mL IVPB (0 g Intravenous Stopped 04/20/22 2153)  labetalol (NORMODYNE) injection 10 mg (10 mg Intravenous Given 04/20/22 1823)  lactated ringers bolus 500 mL (0 mLs Intravenous Stopped 04/21/22 0019)  labetalol (NORMODYNE) injection 20 mg (20 mg Intravenous Given 04/20/22 2115)  calcium gluconate 1 g/ 50 mL sodium chloride IVPB (0 mg Intravenous Stopped 04/21/22 0534)  levETIRAcetam (KEPPRA) 3,000 mg in sodium chloride 0.9 % 250 mL IVPB (0 mg Intravenous Stopped 04/21/22 0514)    ED Course/ Medical Decision Making/ A&P Clinical Course as of 04/21/22 0640  Tue Apr 21, 2022  0030 Consult to CC Dr. Duwayne Heck, who is agreeable to seeing this patient and admitting her to her service. Will consult neurology to evaluate the patient as well. I appreciate her collaboration in the care of this patient.  [RS]  [phone] Consult to Dr. Cheral Marker, neurologist who states that even though stroke is in the differential, goal SBP <180. He is agreeable to seeing the patient. I appreciate his collaboration in the care of this patient.  [RS]    Clinical Course User Index [RS] Sponseller, Gypsy Balsam, PA-C                           Medical Decision Making Amount and/or Complexity of Data Reviewed Labs: ordered. Radiology: ordered.  Risk Prescription drug management. Decision regarding hospitalization.  This patient presents to the ED for concern of multiple complaints, this involves a number of treatment options, and is a complaint that carries with it a high risk of complications and morbidity.  Co morbidities: Discussed  in HPI   Brief History:  With underlying history of hypertension presents to the ED via EMS with lightheadedness, headache, dizziness that is been ongoing for several days.  Also feeling some left-sided numbness to her face.  EMR reviewed including pt PMHx, past surgical history and past visits to ER.   See HPI for more details   Lab Tests:  I ordered and independently interpreted labs.  The pertinent results include:    Labs notable for CBC with no leukocytosis, hemoglobin is within normal limits.  CMP no electrolyte abnormalities, creatinine slightly elevated 1.2, LFTs are within normal limits.  First troponin is negative.  BNP is 243.7.   Imaging Studies:  Xray of the chest showed: New asymmetric perihilar and bibasilar infiltrates or edema, right  worse than left.       Cardiac Monitoring:  The patient was maintained on a cardiac monitor.  I personally viewed and interpreted the cardiac monitored which showed an underlying rhythm of: NSR EKG non-ischemic   Medicines ordered:  I ordered medication including bolus  for hydration. ECHO EF 35-40% Reevaluation of the patient after these medicines showed that the patient stayed the same I have reviewed the patients home medicines and have made adjustments as needed   Patient care signed out to incoming provider pending the rest of her workup.   Portions of this note were generated with L[employer]. Dictation errors may occur despite best attempts at proofreading.   Final Clinical Impression(s) / ED Diagnoses Final diagnoses:  Dizziness  Nausea  Altered mental status, unspecified altered mental status type    Rx / DC Orders ED Discharge Orders     None         Janeece Fitting,  PA-C 04/21/22 7680    Regan Lemming, MD 04/21/22 1308

## 2022-04-20 NOTE — ED Notes (Signed)
Patient returned from MRI. Patient unable to complete exam due to vomiting. Provider made aware. ?

## 2022-04-20 NOTE — ED Triage Notes (Signed)
EMS stated, she has SOB with N/V for 2 weeks and has been weak. Started new medicine on the 24 April. Not sure if this is the cause of the problem ?IV 20g rt. Ac ? Given Zofam 4mg  30 min ago. ?

## 2022-04-20 NOTE — ED Notes (Signed)
Patient reports that she did not take her blood pressure medicine today. ?

## 2022-04-20 NOTE — ED Notes (Signed)
Patient transported to MRI 

## 2022-04-21 ENCOUNTER — Inpatient Hospital Stay (HOSPITAL_COMMUNITY): Payer: Medicare Other

## 2022-04-21 DIAGNOSIS — R4182 Altered mental status, unspecified: Secondary | ICD-10-CM | POA: Diagnosis not present

## 2022-04-21 DIAGNOSIS — Z6836 Body mass index (BMI) 36.0-36.9, adult: Secondary | ICD-10-CM | POA: Diagnosis not present

## 2022-04-21 DIAGNOSIS — R569 Unspecified convulsions: Secondary | ICD-10-CM

## 2022-04-21 DIAGNOSIS — F5101 Primary insomnia: Secondary | ICD-10-CM | POA: Diagnosis not present

## 2022-04-21 DIAGNOSIS — D649 Anemia, unspecified: Secondary | ICD-10-CM | POA: Diagnosis present

## 2022-04-21 DIAGNOSIS — M79604 Pain in right leg: Secondary | ICD-10-CM | POA: Diagnosis present

## 2022-04-21 DIAGNOSIS — R110 Nausea: Secondary | ICD-10-CM

## 2022-04-21 DIAGNOSIS — Z20822 Contact with and (suspected) exposure to covid-19: Secondary | ICD-10-CM | POA: Diagnosis present

## 2022-04-21 DIAGNOSIS — R29810 Facial weakness: Secondary | ICD-10-CM | POA: Diagnosis present

## 2022-04-21 DIAGNOSIS — G8194 Hemiplegia, unspecified affecting left nondominant side: Secondary | ICD-10-CM | POA: Diagnosis present

## 2022-04-21 DIAGNOSIS — I6310 Cerebral infarction due to embolism of unspecified precerebral artery: Secondary | ICD-10-CM | POA: Diagnosis not present

## 2022-04-21 DIAGNOSIS — E785 Hyperlipidemia, unspecified: Secondary | ICD-10-CM | POA: Diagnosis present

## 2022-04-21 DIAGNOSIS — D631 Anemia in chronic kidney disease: Secondary | ICD-10-CM | POA: Diagnosis present

## 2022-04-21 DIAGNOSIS — R42 Dizziness and giddiness: Secondary | ICD-10-CM

## 2022-04-21 DIAGNOSIS — N1831 Chronic kidney disease, stage 3a: Secondary | ICD-10-CM | POA: Diagnosis present

## 2022-04-21 DIAGNOSIS — N1832 Chronic kidney disease, stage 3b: Secondary | ICD-10-CM | POA: Diagnosis present

## 2022-04-21 DIAGNOSIS — G894 Chronic pain syndrome: Secondary | ICD-10-CM | POA: Diagnosis present

## 2022-04-21 DIAGNOSIS — Z79899 Other long term (current) drug therapy: Secondary | ICD-10-CM | POA: Diagnosis not present

## 2022-04-21 DIAGNOSIS — F17200 Nicotine dependence, unspecified, uncomplicated: Secondary | ICD-10-CM | POA: Diagnosis not present

## 2022-04-21 DIAGNOSIS — E1122 Type 2 diabetes mellitus with diabetic chronic kidney disease: Secondary | ICD-10-CM | POA: Diagnosis present

## 2022-04-21 DIAGNOSIS — I6312 Cerebral infarction due to embolism of basilar artery: Secondary | ICD-10-CM | POA: Diagnosis not present

## 2022-04-21 DIAGNOSIS — R5381 Other malaise: Secondary | ICD-10-CM | POA: Diagnosis present

## 2022-04-21 DIAGNOSIS — E1151 Type 2 diabetes mellitus with diabetic peripheral angiopathy without gangrene: Secondary | ICD-10-CM | POA: Diagnosis present

## 2022-04-21 DIAGNOSIS — I5022 Chronic systolic (congestive) heart failure: Secondary | ICD-10-CM | POA: Diagnosis present

## 2022-04-21 DIAGNOSIS — Z91018 Allergy to other foods: Secondary | ICD-10-CM | POA: Diagnosis not present

## 2022-04-21 DIAGNOSIS — Z713 Dietary counseling and surveillance: Secondary | ICD-10-CM | POA: Diagnosis not present

## 2022-04-21 DIAGNOSIS — I160 Hypertensive urgency: Secondary | ICD-10-CM | POA: Diagnosis not present

## 2022-04-21 DIAGNOSIS — I639 Cerebral infarction, unspecified: Secondary | ICD-10-CM | POA: Diagnosis not present

## 2022-04-21 DIAGNOSIS — F319 Bipolar disorder, unspecified: Secondary | ICD-10-CM | POA: Diagnosis present

## 2022-04-21 DIAGNOSIS — I69398 Other sequelae of cerebral infarction: Secondary | ICD-10-CM | POA: Diagnosis present

## 2022-04-21 DIAGNOSIS — I6789 Other cerebrovascular disease: Secondary | ICD-10-CM | POA: Diagnosis present

## 2022-04-21 DIAGNOSIS — I6329 Cerebral infarction due to unspecified occlusion or stenosis of other precerebral arteries: Secondary | ICD-10-CM | POA: Diagnosis present

## 2022-04-21 DIAGNOSIS — Z91010 Allergy to peanuts: Secondary | ICD-10-CM | POA: Diagnosis not present

## 2022-04-21 DIAGNOSIS — I6340 Cerebral infarction due to embolism of unspecified cerebral artery: Secondary | ICD-10-CM | POA: Diagnosis not present

## 2022-04-21 DIAGNOSIS — I6381 Other cerebral infarction due to occlusion or stenosis of small artery: Secondary | ICD-10-CM | POA: Diagnosis present

## 2022-04-21 DIAGNOSIS — N179 Acute kidney failure, unspecified: Secondary | ICD-10-CM | POA: Diagnosis present

## 2022-04-21 DIAGNOSIS — I701 Atherosclerosis of renal artery: Secondary | ICD-10-CM | POA: Diagnosis present

## 2022-04-21 DIAGNOSIS — I10 Essential (primary) hypertension: Secondary | ICD-10-CM | POA: Diagnosis not present

## 2022-04-21 DIAGNOSIS — E1140 Type 2 diabetes mellitus with diabetic neuropathy, unspecified: Secondary | ICD-10-CM | POA: Diagnosis present

## 2022-04-21 DIAGNOSIS — F1220 Cannabis dependence, uncomplicated: Secondary | ICD-10-CM | POA: Diagnosis present

## 2022-04-21 DIAGNOSIS — I2510 Atherosclerotic heart disease of native coronary artery without angina pectoris: Secondary | ICD-10-CM | POA: Diagnosis present

## 2022-04-21 DIAGNOSIS — F419 Anxiety disorder, unspecified: Secondary | ICD-10-CM | POA: Diagnosis present

## 2022-04-21 DIAGNOSIS — E1169 Type 2 diabetes mellitus with other specified complication: Secondary | ICD-10-CM | POA: Diagnosis not present

## 2022-04-21 DIAGNOSIS — E739 Lactose intolerance, unspecified: Secondary | ICD-10-CM | POA: Diagnosis present

## 2022-04-21 DIAGNOSIS — I674 Hypertensive encephalopathy: Secondary | ICD-10-CM | POA: Diagnosis present

## 2022-04-21 DIAGNOSIS — I6389 Other cerebral infarction: Secondary | ICD-10-CM | POA: Diagnosis not present

## 2022-04-21 DIAGNOSIS — R29706 NIHSS score 6: Secondary | ICD-10-CM | POA: Diagnosis present

## 2022-04-21 DIAGNOSIS — Z8659 Personal history of other mental and behavioral disorders: Secondary | ICD-10-CM | POA: Diagnosis not present

## 2022-04-21 DIAGNOSIS — F1210 Cannabis abuse, uncomplicated: Secondary | ICD-10-CM | POA: Diagnosis not present

## 2022-04-21 DIAGNOSIS — E669 Obesity, unspecified: Secondary | ICD-10-CM | POA: Diagnosis present

## 2022-04-21 DIAGNOSIS — Z7984 Long term (current) use of oral hypoglycemic drugs: Secondary | ICD-10-CM | POA: Diagnosis not present

## 2022-04-21 DIAGNOSIS — E119 Type 2 diabetes mellitus without complications: Secondary | ICD-10-CM | POA: Diagnosis not present

## 2022-04-21 DIAGNOSIS — Z886 Allergy status to analgesic agent status: Secondary | ICD-10-CM | POA: Diagnosis not present

## 2022-04-21 DIAGNOSIS — I63119 Cerebral infarction due to embolism of unspecified vertebral artery: Secondary | ICD-10-CM | POA: Diagnosis not present

## 2022-04-21 DIAGNOSIS — I3139 Other pericardial effusion (noninflammatory): Secondary | ICD-10-CM | POA: Diagnosis present

## 2022-04-21 DIAGNOSIS — I130 Hypertensive heart and chronic kidney disease with heart failure and stage 1 through stage 4 chronic kidney disease, or unspecified chronic kidney disease: Secondary | ICD-10-CM | POA: Diagnosis present

## 2022-04-21 DIAGNOSIS — K219 Gastro-esophageal reflux disease without esophagitis: Secondary | ICD-10-CM | POA: Diagnosis present

## 2022-04-21 DIAGNOSIS — E872 Acidosis, unspecified: Secondary | ICD-10-CM | POA: Diagnosis not present

## 2022-04-21 DIAGNOSIS — I161 Hypertensive emergency: Secondary | ICD-10-CM | POA: Diagnosis present

## 2022-04-21 LAB — BLOOD GAS, VENOUS
Acid-Base Excess: 5.8 mmol/L — ABNORMAL HIGH (ref 0.0–2.0)
Bicarbonate: 30.6 mmol/L — ABNORMAL HIGH (ref 20.0–28.0)
Drawn by: 64366
O2 Saturation: 55 %
Patient temperature: 36.4
pCO2, Ven: 43 mmHg — ABNORMAL LOW (ref 44–60)
pH, Ven: 7.46 — ABNORMAL HIGH (ref 7.25–7.43)
pO2, Ven: <31 mmHg — CL (ref 32–45)

## 2022-04-21 LAB — TROPONIN I (HIGH SENSITIVITY)
Troponin I (High Sensitivity): 21 ng/L — ABNORMAL HIGH (ref <18)
Troponin I (High Sensitivity): 19 ng/L — ABNORMAL HIGH (ref <18)
Troponin I (High Sensitivity): 42 ng/L — ABNORMAL HIGH (ref <18)
Troponin I (High Sensitivity): 47 ng/L — ABNORMAL HIGH (ref <18)

## 2022-04-21 LAB — BASIC METABOLIC PANEL
Anion gap: 20 — ABNORMAL HIGH (ref 5–15)
Anion gap: 20 — ABNORMAL HIGH (ref 5–15)
BUN: 20 mg/dL (ref 6–20)
BUN: 21 mg/dL — ABNORMAL HIGH (ref 6–20)
CO2: 17 mmol/L — ABNORMAL LOW (ref 22–32)
CO2: 15 mmol/L — ABNORMAL LOW (ref 22–32)
Calcium: 7.2 mg/dL — ABNORMAL LOW (ref 8.9–10.3)
Calcium: 7.5 mg/dL — ABNORMAL LOW (ref 8.9–10.3)
Chloride: 101 mmol/L (ref 98–111)
Chloride: 104 mmol/L (ref 98–111)
Creatinine, Ser: 1.32 mg/dL — ABNORMAL HIGH (ref 0.44–1.00)
Creatinine, Ser: 1.46 mg/dL — ABNORMAL HIGH (ref 0.44–1.00)
GFR, Estimated: 47 mL/min — ABNORMAL LOW (ref >60)
GFR, Estimated: 41 mL/min — ABNORMAL LOW (ref >60)
Glucose, Bld: 322 mg/dL — ABNORMAL HIGH (ref 70–99)
Glucose, Bld: 343 mg/dL — ABNORMAL HIGH (ref 70–99)
Potassium: 3.7 mmol/L (ref 3.5–5.1)
Potassium: 3.8 mmol/L (ref 3.5–5.1)
Sodium: 138 mmol/L (ref 135–145)
Sodium: 139 mmol/L (ref 135–145)

## 2022-04-21 LAB — CBG MONITORING, ED: Glucose-Capillary: 292 mg/dL — ABNORMAL HIGH (ref 70–99)

## 2022-04-21 LAB — URINALYSIS, ROUTINE W REFLEX MICROSCOPIC
Bacteria, UA: NONE SEEN
Bilirubin Urine: NEGATIVE
Glucose, UA: 150 mg/dL — AB
Ketones, ur: 20 mg/dL — AB
Leukocytes,Ua: NEGATIVE
Nitrite: NEGATIVE
Protein, ur: >=300 mg/dL — AB
Specific Gravity, Urine: 1.044 — ABNORMAL HIGH (ref 1.005–1.030)
pH: 5 (ref 5.0–8.0)

## 2022-04-21 LAB — CBC
HCT: 42.9 % (ref 36.0–46.0)
Hemoglobin: 14.1 g/dL (ref 12.0–15.0)
MCH: 28.5 pg (ref 26.0–34.0)
MCHC: 32.9 g/dL (ref 30.0–36.0)
MCV: 86.8 fL (ref 80.0–100.0)
Platelets: 354 10*3/uL (ref 150–400)
RBC: 4.94 MIL/uL (ref 3.87–5.11)
RDW: 13.3 % (ref 11.5–15.5)
WBC: 17.7 10*3/uL — ABNORMAL HIGH (ref 4.0–10.5)
nRBC: 0 % (ref 0.0–0.2)

## 2022-04-21 LAB — RESP PANEL BY RT-PCR (FLU A&B, COVID) ARPGX2
Influenza A by PCR: NEGATIVE
Influenza B by PCR: NEGATIVE
SARS Coronavirus 2 by RT PCR: NEGATIVE

## 2022-04-21 LAB — GLUCOSE, CAPILLARY
Glucose-Capillary: 302 mg/dL — ABNORMAL HIGH (ref 70–99)
Glucose-Capillary: 242 mg/dL — ABNORMAL HIGH (ref 70–99)
Glucose-Capillary: 207 mg/dL — ABNORMAL HIGH (ref 70–99)
Glucose-Capillary: 247 mg/dL — ABNORMAL HIGH (ref 70–99)
Glucose-Capillary: 136 mg/dL — ABNORMAL HIGH (ref 70–99)

## 2022-04-21 LAB — CK: Total CK: 258 U/L — ABNORMAL HIGH (ref 38–234)

## 2022-04-21 LAB — MAGNESIUM
Magnesium: <0.5 mg/dL — CL (ref 1.7–2.4)
Magnesium: <0.5 mg/dL — CL (ref 1.7–2.4)

## 2022-04-21 LAB — OSMOLALITY: Osmolality: 310 mOsm/kg — ABNORMAL HIGH (ref 275–295)

## 2022-04-21 LAB — TSH: TSH: 1.286 u[IU]/mL (ref 0.350–4.500)

## 2022-04-21 LAB — LACTIC ACID, PLASMA
Lactic Acid, Venous: 1.7 mmol/L (ref 0.5–1.9)
Lactic Acid, Venous: 2 mmol/L (ref 0.5–1.9)

## 2022-04-21 LAB — MRSA NEXT GEN BY PCR, NASAL: MRSA by PCR Next Gen: NOT DETECTED

## 2022-04-21 LAB — BETA-HYDROXYBUTYRIC ACID: Beta-Hydroxybutyric Acid: 1.24 mmol/L — ABNORMAL HIGH (ref 0.05–0.27)

## 2022-04-21 LAB — PHOSPHORUS: Phosphorus: 5.1 mg/dL — ABNORMAL HIGH (ref 2.5–4.6)

## 2022-04-21 MED ORDER — NICARDIPINE HCL IN NACL 20-0.86 MG/200ML-% IV SOLN
3.0000 mg/h | INTRAVENOUS | Status: DC
  Administered 2022-04-21: 3 mg/h via INTRAVENOUS
  Administered 2022-04-21: 5 mg/h via INTRAVENOUS
  Administered 2022-04-21: 3.5 mg/h via INTRAVENOUS
  Filled 2022-04-21 (×4): qty 200

## 2022-04-21 MED ORDER — INSULIN GLARGINE-YFGN 100 UNIT/ML ~~LOC~~ SOLN
8.0000 [IU] | Freq: Once | SUBCUTANEOUS | Status: AC
  Administered 2022-04-21: 8 [IU] via SUBCUTANEOUS
  Filled 2022-04-21: qty 0.08

## 2022-04-21 MED ORDER — INSULIN ASPART 100 UNIT/ML IJ SOLN
0.0000 [IU] | INTRAMUSCULAR | Status: DC
  Administered 2022-04-21 (×2): 5 [IU] via SUBCUTANEOUS
  Administered 2022-04-21: 2 [IU] via SUBCUTANEOUS
  Administered 2022-04-21 (×2): 5 [IU] via SUBCUTANEOUS
  Administered 2022-04-21: 8 [IU] via SUBCUTANEOUS
  Administered 2022-04-22 – 2022-04-23 (×4): 2 [IU] via SUBCUTANEOUS
  Administered 2022-04-23: 3 [IU] via SUBCUTANEOUS
  Administered 2022-04-23: 5 [IU] via SUBCUTANEOUS
  Administered 2022-04-23 – 2022-04-24 (×3): 3 [IU] via SUBCUTANEOUS
  Administered 2022-04-24: 2 [IU] via SUBCUTANEOUS
  Administered 2022-04-24: 11 [IU] via SUBCUTANEOUS
  Administered 2022-04-24: 2 [IU] via SUBCUTANEOUS
  Administered 2022-04-24: 3 [IU] via SUBCUTANEOUS
  Administered 2022-04-25 (×2): 5 [IU] via SUBCUTANEOUS
  Administered 2022-04-25: 3 [IU] via SUBCUTANEOUS
  Administered 2022-04-25: 8 [IU] via SUBCUTANEOUS
  Administered 2022-04-25: 3 [IU] via SUBCUTANEOUS
  Administered 2022-04-26: 2 [IU] via SUBCUTANEOUS
  Administered 2022-04-26: 3 [IU] via SUBCUTANEOUS
  Administered 2022-04-26: 15 [IU] via SUBCUTANEOUS
  Administered 2022-04-26: 5 [IU] via SUBCUTANEOUS
  Administered 2022-04-26: 3 [IU] via SUBCUTANEOUS
  Administered 2022-04-27: 11 [IU] via SUBCUTANEOUS
  Administered 2022-04-27: 2 [IU] via SUBCUTANEOUS
  Administered 2022-04-27: 5 [IU] via SUBCUTANEOUS
  Administered 2022-04-27 (×2): 3 [IU] via SUBCUTANEOUS
  Administered 2022-04-27 – 2022-04-28 (×2): 8 [IU] via SUBCUTANEOUS
  Administered 2022-04-28 (×2): 5 [IU] via SUBCUTANEOUS
  Administered 2022-04-28: 2 [IU] via SUBCUTANEOUS
  Administered 2022-04-28: 8 [IU] via SUBCUTANEOUS
  Administered 2022-04-28 – 2022-04-29 (×4): 5 [IU] via SUBCUTANEOUS

## 2022-04-21 MED ORDER — INSULIN GLARGINE-YFGN 100 UNIT/ML ~~LOC~~ SOLN
15.0000 [IU] | Freq: Every day | SUBCUTANEOUS | Status: DC
  Filled 2022-04-21: qty 0.15

## 2022-04-21 MED ORDER — DEXTROSE IN LACTATED RINGERS 5 % IV SOLN: INTRAVENOUS | Status: DC

## 2022-04-21 MED ORDER — LEVETIRACETAM IN NACL 1000 MG/100ML IV SOLN
1000.0000 mg | Freq: Two times a day (BID) | INTRAVENOUS | Status: DC
  Administered 2022-04-21 – 2022-04-22 (×2): 1000 mg via INTRAVENOUS
  Filled 2022-04-21 (×3): qty 100

## 2022-04-21 MED ORDER — POLYETHYLENE GLYCOL 3350 17 G PO PACK
17.0000 g | PACK | Freq: Every day | ORAL | Status: DC | PRN
  Filled 2022-04-21: qty 1

## 2022-04-21 MED ORDER — CLOPIDOGREL BISULFATE 75 MG PO TABS
75.0000 mg | ORAL_TABLET | Freq: Every day | ORAL | Status: DC
Start: 2022-04-21 — End: 2022-04-29
  Administered 2022-04-21 – 2022-04-29 (×9): 75 mg via ORAL
  Filled 2022-04-21 (×9): qty 1

## 2022-04-21 MED ORDER — SODIUM BICARBONATE 8.4 % IV SOLN
INTRAVENOUS | Status: DC
  Filled 2022-04-21: qty 1000

## 2022-04-21 MED ORDER — LEVETIRACETAM 500 MG/5ML IV SOLN
3000.0000 mg | Freq: Once | INTRAVENOUS | Status: AC
  Administered 2022-04-21: 3000 mg via INTRAVENOUS
  Filled 2022-04-21: qty 30

## 2022-04-21 MED ORDER — STERILE WATER FOR INJECTION IV SOLN
INTRAVENOUS | Status: DC
  Filled 2022-04-21: qty 1000

## 2022-04-21 MED ORDER — HEPARIN SODIUM (PORCINE) 5000 UNIT/ML IJ SOLN
5000.0000 [IU] | Freq: Three times a day (TID) | INTRAMUSCULAR | Status: DC
  Administered 2022-04-21 – 2022-04-29 (×26): 5000 [IU] via SUBCUTANEOUS
  Filled 2022-04-21 (×25): qty 1

## 2022-04-21 MED ORDER — MAGNESIUM SULFATE 4 GM/100ML IV SOLN
4.0000 g | Freq: Once | INTRAVENOUS | Status: AC
  Administered 2022-04-21: 4 g via INTRAVENOUS
  Filled 2022-04-21: qty 100

## 2022-04-21 MED ORDER — INSULIN GLARGINE-YFGN 100 UNIT/ML ~~LOC~~ SOLN
8.0000 [IU] | Freq: Every day | SUBCUTANEOUS | Status: DC
  Filled 2022-04-21: qty 0.08

## 2022-04-21 MED ORDER — CHLORHEXIDINE GLUCONATE CLOTH 2 % EX PADS
6.0000 | MEDICATED_PAD | Freq: Every day | CUTANEOUS | Status: DC
  Administered 2022-04-21 – 2022-04-22 (×3): 6 via TOPICAL

## 2022-04-21 MED ORDER — CALCIUM GLUCONATE-NACL 1-0.675 GM/50ML-% IV SOLN
1.0000 g | Freq: Once | INTRAVENOUS | Status: AC
  Administered 2022-04-21: 1000 mg via INTRAVENOUS
  Filled 2022-04-21: qty 50

## 2022-04-21 MED ORDER — LORAZEPAM 2 MG/ML IJ SOLN
1.0000 mg | INTRAMUSCULAR | Status: DC | PRN
  Administered 2022-04-21 – 2022-04-25 (×2): 1 mg via INTRAVENOUS
  Filled 2022-04-21 (×3): qty 1

## 2022-04-21 MED ORDER — DOCUSATE SODIUM 100 MG PO CAPS
100.0000 mg | ORAL_CAPSULE | Freq: Two times a day (BID) | ORAL | Status: DC | PRN
Start: 2022-04-21 — End: 2022-04-29
  Administered 2022-04-27: 100 mg via ORAL
  Filled 2022-04-21: qty 1

## 2022-04-21 MED ORDER — METOPROLOL TARTRATE 5 MG/5ML IV SOLN
2.5000 mg | INTRAVENOUS | Status: DC | PRN
  Administered 2022-04-21 – 2022-04-25 (×4): 5 mg via INTRAVENOUS
  Filled 2022-04-21 (×4): qty 5

## 2022-04-21 MED ORDER — MELATONIN 3 MG PO TABS: 3.0000 mg | ORAL_TABLET | Freq: Once | ORAL | Status: AC | PRN

## 2022-04-21 MED ORDER — SODIUM CHLORIDE 0.9 % IV SOLN: INTRAVENOUS | Status: DC | PRN

## 2022-04-21 MED ORDER — SODIUM CHLORIDE 0.9 % IV SOLN
12.5000 mg | Freq: Four times a day (QID) | INTRAVENOUS | Status: DC | PRN
  Administered 2022-04-21 – 2022-04-22 (×3): 12.5 mg via INTRAVENOUS
  Filled 2022-04-21 (×3): qty 0.5

## 2022-04-21 MED ORDER — MAGNESIUM SULFATE 2 GM/50ML IV SOLN
2.0000 g | Freq: Once | INTRAVENOUS | Status: AC
  Administered 2022-04-21: 2 g via INTRAVENOUS
  Filled 2022-04-21: qty 50

## 2022-04-21 MED ORDER — ONDANSETRON HCL 4 MG/2ML IJ SOLN
4.0000 mg | Freq: Four times a day (QID) | INTRAMUSCULAR | Status: DC | PRN
  Administered 2022-04-21 – 2022-04-25 (×6): 4 mg via INTRAVENOUS
  Filled 2022-04-21 (×7): qty 2

## 2022-04-21 MED ORDER — STROKE: EARLY STAGES OF RECOVERY BOOK
Freq: Once | Status: AC
  Filled 2022-04-21 (×2): qty 1

## 2022-04-21 NOTE — Procedures (Signed)
Patient Name: Erin Jackson  ?MRN: [id number]  ?Epilepsy Attending: Charlsie Quest  ?Referring Physician/Provider: Caryl Pina, MD ?Date: 04/21/2022 ?Duration: 22.10 mins ? ?Patient history: 56 year old female presenting with encephalopathy and seizure-like activity in the context of severe HTN.  EEG to evaluate for seizure. ? ?Level of alertness: Awake,asleep ? ?AEDs during EEG study: LEV ? ?Technical aspects: This EEG study was done with scalp electrodes positioned according to the 10-20 International system of electrode placement. Electrical activity was acquired at a sampling rate of 500Hz  and reviewed with a high frequency filter of 70Hz  and a low frequency filter of 1Hz . EEG data were recorded continuously and digitally stored.  ? ?Description: The posterior dominant rhythm consists of 8-9 Hz activity of moderate voltage (25-35 uV) seen predominantly in posterior head regions, symmetric and reactive to eye opening and eye closing. Sleep was characterized by vertex waves, sleep spindles (12 to 14 Hz), maximal frontocentral region.  Hyperventilation and photic stimulation were not performed.    ? ?IMPRESSION: ?This study is within normal limits. No seizures or epileptiform discharges were seen throughout the recording. ? ?  ? ?

## 2022-04-21 NOTE — Progress Notes (Addendum)
Subjective: ?KEARY HANAK is an 56 y.o. female with a PMHx of HTN, T2DM, chronic back pain, anxiety, recent heart catheterization on 4/20 and GERD who presented to the ED late Monday night with N/V and SOB. She also complained of a headache. In the ED she reported that she did not take her blood pressure medicine today. She had multiple complaints including numbness to the left side of her face, dizziness, lightheadedness, shortness of breath, chest pain, nonbilious, nonbloody emesis and burning with urination. She presented hypertensive (highest of 243/134) and tachycardic to 175 with sinus tachy on EKG. She had a AGMA with minimal lactic acid elevation.She had a significantly low calcium and magnesium with a urinalysis  positive for ketones and proteinuria. CT head revealed no acute abnormality.  ?  ?On evaluation today, she remains lethargic but responds appropriate to commands. She continues to endorse dizziness that is worsened with movement. She has associated nausea and emesis. She continue to endorse left lower quadrant and suprapubic abdominal pain with dysuria. She endorses right facial numbness and left 5th digit of hand and foot numbness.  ? ?Exam: ?Vitals:  ? 04/21/22 0645 04/21/22 0700  ?BP: (!) 152/73 122/73  ?Pulse: (!) 115 (!) 112  ?Resp: (!) 25 (!) 30  ?Temp:    ?SpO2: 98% 98%  ? ?Gen: In bed, NAD ?Resp: non-labored breathing, no acute distress ?Abd: soft, nt ? ?Neuro: ?MS: alert and orient to persona and place, but lethargic  ?CN: see below ?Motor: generalized weakness without focal deficits, diffuse bradykinesia presents with evidence of akithisia/rigors, no true ataxia presents on exam  ?Sensory: intact to soft touch, sharp and coolness ?DTR: +2 upper and lower extremity DTRs ? ?Cranial Nerves: ?I: sense of smell not tested,  ?II: visual field normal,  ?II: pupils equal, round, reactive to light and accommodation,  ?III,VII: ptosis absent,  ?III,IV,VI: extraocular muscles normal  ?V:  mastication normal,  ?V: facial light touch sensation decreased on right ?V,VII: corneal reflex present bilaterally,  ?VII: facial muscle function - upper normal bilaterally,  ?VII: facial muscle function - lower normal bilaterally,  ?VIII: hearing normal,  ?IX: soft palate elevation normal ?IX,X: gag reflex present,  ?XI: trapezius strength normal ?XI: sternocleidomastoid strength normal  ?XI: neck flexion without pain but limited by nausea  ?XII: tongue strength normal bilaterally ? ?Pertinent Labs: ? ? ?  Latest Ref Rng & Units 04/21/2022  ?  3:43 AM 04/20/2022  ? 12:49 PM 04/20/2022  ? 12:07 PM  ?CBC  ?WBC 4.0 - 10.5 K/uL 17.7    10.3    ?Hemoglobin 12.0 - 15.0 g/dL 03.4   74.2   59.5    ?Hematocrit 36.0 - 46.0 % 42.9   43.0   40.6    ?Platelets 150 - 400 K/uL 354    276    ? ? ?  Latest Ref Rng & Units 04/21/2022  ?  2:26 AM 04/20/2022  ? 12:49 PM 04/20/2022  ? 12:07 PM  ?BMP  ?Glucose 70 - 99 mg/dL 638   756   433    ?BUN 6 - 20 mg/dL 20   16   14     ?Creatinine 0.44 - 1.00 mg/dL   2.95   1.88    ?Sodium 135 - 145 mmol/L 138   139   138    ?Potassium 3.5 - 5.1 mmol/L 3.7   3.7   3.5    ?Chloride 98 - 111 mmol/L 101   100  101    ?CO2 22 - 32 mmol/L 17    24    ?Calcium 8.9 - 10.3 mg/dL 7.2    7.5    ? ?Lab Results  ?Component Value Date  ? MG <0.5 (LL) 04/21/2022  ? ?Lactic Acid, Venous ?   ?Component Value Date/Time  ? LATICACIDVEN 2.0 (HH) 04/21/2022 0343  ? ?Urinalysis ?   ?Component Value Date/Time  ? COLORURINE YELLOW 04/21/2022 0233  ? APPEARANCEUR HAZY (A) 04/21/2022 0233  ? LABSPEC 1.044 (H) 04/21/2022 0233  ? PHURINE 5.0 04/21/2022 0233  ? GLUCOSEU 150 (A) 04/21/2022 0233  ? HGBUR MODERATE (A) 04/21/2022 0233  ? BILIRUBINUR NEGATIVE 04/21/2022 0233  ? KETONESUR 20 (A) 04/21/2022 0233  ? PROTEINUR >=300 (A) 04/21/2022 0233  ? NITRITE NEGATIVE 04/21/2022 0233  ? LEUKOCYTESUR NEGATIVE 04/21/2022 0233  ? ? ?Impression:  ? ?CT Head WO Contrast: (04/21/22) ?FINDINGS: ?Brain: No acute infarct or hemorrhage.  Lateral ventricles and ?midline structures are unremarkable. No acute extra-axial fluid ?collections. No mass effect. ?Vascular: No hyperdense vessel or unexpected calcification. ?Skull: Normal. Negative for fracture or focal lesion. ?Sinuses/Orbits: No acute finding. ?Other: None. ?  ?IMPRESSION: ?1. No acute intracranial process.  Stable exam. ? ?Recommendations: ?1) Acute Encephalopathy - unknown etiology but concern for HTN encephalopathy versus PRES. Alternatively, she does have a history Patient has clinically improved since admission. She continues to have significant of dizziness with right sided numbness and emesis concerning for vestibular or posterior etiology, however no true nystagmus or ataxia on exam. Patient had difficult time performing finger to know, and therefore difficult to determine presence of dysmetria. Regardless, she will need an brain MRI to further evaluate the underlying etiology. ?- Continue seizure precautions and AED ?- Seizure precautions ?- Cont treating nausea ?- Continue antihypertensive management with 25% reduction in BP. Will aim to slowly normalize.  ?- Cont to correct metabolic disturbance/electrolytes.  ?- MRI brian pending   ? ?Chari Manning, D.O.  ?Internal Medicine Resident, PGY-3 ?[facility]  ?7:39 AM, 04/21/2022  ? ? ?I have seen the patient reviewed the above note of Dr. Marchia Bond.  She appears tremulous, but her tremor is distractible.  She has right facial numbness in all three distributions as well as left hemibody numbness.  There is a hypodensity on CT in her right pons that was not present on a previous CT in 2018, but unclear when it started.  I think there is a significant possibility that this represents subacute stroke, but with hypertensive encephalopathy and a differential and symptoms present for several weeks, I think that moderate blood pressure control is appropriate. ? ?Given that stroke is in the differential, and she at the  very least has previous strokes, I would favor starting antiplatelet therapy and I will start Plavix at this time. ? ?Ritta Slot, MD ?[facility] ?[phone] ? ?If 7pm- 7am, please page neurology on call as listed in AMION. ? ?

## 2022-04-21 NOTE — Consult Note (Addendum)
NEURO HOSPITALIST CONSULT NOTE   Requestig physician: Dr. Duwayne Heck  Reason for Consult: Encephalopathy and seizure-like activity in the context of severe HTN  History obtained from:  ICU Team and Chart     HPI:                                                                                                                                          Erin Jackson is an 56 y.o. female with a PMHx of HTN, DM, chronic back pain, anxiety, recent heart catheterization on 4/20 and GERD who presented to the ED late Monday night with N/V and SOB. She also complained of a headache. In the ED she reported that she did not take her blood pressure medicine today. She had multiple complaints including numbness to the left side of her face, dizziness, lightheadedness, shortness of breath, chest pain, nonbilious, nonbloody emesis and burning with urination. CT head revealed no acute abnormality.   She was treated with Reglan in the ED. Approximately 30 to 40 minutes later, EDP was called to bedside as the patient became more unresponsive; she had her eyes open, would respond and moaned to pain but would no longer follow commands.  HR was in the 150s with SBP in the 200's and she was noted to be diaphoretic. Her CBG was 218. Imaging revealed no evidence for dissection.  She was given 50 mg of IV Benadryl for possible akathisia versus allergic reaction.    DDx in the ED included PRES.   On arrival to the ICU, brief eye-twitching movements were noted, without any shaking of her limbs.   Past Medical History:  Diagnosis Date   Anxiety    Chronic back pain    Diabetes mellitus without complication ([place])    GERD (gastroesophageal reflux disease)    Hypertension     Past Surgical History:  Procedure Laterality Date   ABDOMINAL HYSTERECTOMY     BREAST LUMPECTOMY Left 03/25/2022   Procedure: LEFT BREAST LUMPECTOMY;  Surgeon: Jovita Kussmaul, MD;  Location: [place];  Service:  General;  Laterality: Left;   BREAST REDUCTION SURGERY Bilateral 08/15/2018   Procedure: BILATERAL MAMMARY REDUCTION  (BREAST);  Surgeon: Cristine Polio, MD;  Location: [place];  Service: Plastics;  Laterality: Bilateral;   BREAST REDUCTION SURGERY Left 09/25/2019   Procedure: EXCISION OF FAT NECROSIS OF LEFT BREAST;  Surgeon: Cristine Polio, MD;  Location: [place];  Service: Plastics;  Laterality: Left;   CARPAL TUNNEL RELEASE Right 06/17/2020   right thumb CMC arthroplasty with tendon transfer and right carpal tunnel release.    CERVICAL SPINE SURGERY  2015   has a metal plate   FOOT SURGERY Left 2014   HAND SURGERY     LEFT  HEART CATH AND CORONARY ANGIOGRAPHY N/A 04/09/2022   Procedure: LEFT HEART CATH AND CORONARY ANGIOGRAPHY;  Surgeon: Dixie Dials, MD;  Location: [place] [facility];  Service: Cardiovascular;  Laterality: N/A;   REDUCTION MAMMAPLASTY Bilateral 07/2018    Family History  Problem Relation Age of Onset   Breast cancer Maternal Aunt    Breast cancer Maternal Grandmother          Social History:  reports that she has been smoking cigarettes. She has been smoking an average of .5 packs per day. She has never used smokeless tobacco. She reports current alcohol use. She reports that she does not use drugs.  Allergies  Allergen Reactions   Citrus Shortness Of Breath   Cucumber Extract Anaphylaxis   Molds & Smuts Shortness Of Breath   Norvasc [Amlodipine] Other (See Comments)    Muscle became very tight   Apple Juice    Other Other (See Comments)    Tree nuts, walnuts   Aspirin Rash   Ibuprofen Rash   Niacin And Related Rash    MEDICATIONS:                                                                                                                     Prior to Admission:  Medications Prior to Admission  Medication Sig Dispense Refill Last Dose   acetaminophen (TYLENOL) 500 MG tablet Take 500 mg by mouth every 6 (six)  hours as needed for headache.      albuterol (VENTOLIN HFA) 108 (90 Base) MCG/ACT inhaler Inhale 2 puffs into the lungs every 6 (six) hours as needed for wheezing or shortness of breath.      alprazolam (XANAX) 2 MG tablet Take 0.5 tablets (1 mg total) by mouth 2 (two) times daily as needed for anxiety. 30 tablet 0    atorvastatin (LIPITOR) 80 MG tablet Take 1 tablet (80 mg total) by mouth daily. 30 tablet 0    cetirizine (ZYRTEC ALLERGY) 10 MG tablet Take 1 tablet (10 mg total) by mouth daily. 30 tablet 1    cloNIDine (CATAPRES) 0.2 MG tablet Take 1 tablet (0.2 mg total) by mouth 2 (two) times daily.      clopidogrel (PLAVIX) 75 MG tablet Take 1 tablet (75 mg total) by mouth daily. 30 tablet 0    EPINEPHRINE 0.3 mg/0.3 mL IJ SOAJ injection Inject 0.3 mLs (0.3 mg total) into the muscle once as needed (allergic reaction). (Patient taking differently: Inject 0.3 mg into the muscle as needed for anaphylaxis (allergic reaction).) 1 Device 1    HYDROcodone-acetaminophen (NORCO) 10-325 MG tablet Take 1 tablet by mouth in the morning and at bedtime.      losartan (COZAAR) 25 MG tablet Take 1 tablet (25 mg total) by mouth daily. 30 tablet 0    metFORMIN (GLUCOPHAGE) 500 MG tablet Take 1 tablet (500 mg total) by mouth 2 (two) times daily with a meal.      metoprolol tartrate (LOPRESSOR) 50 MG tablet Take 1 tablet (  50 mg total) by mouth 2 (two) times daily. 60 tablet 0    NARCAN 4 MG/0.1ML LIQD nasal spray kit Place 0.4 mg into the nose as needed (opoid overdose).      nicotine (NICODERM CQ - DOSED IN MG/24 HOURS) 14 mg/24hr patch Place 1 patch (14 mg total) onto the skin daily. 28 patch 0    nitroGLYCERIN (NITROSTAT) 0.4 MG SL tablet Place 1 tablet (0.4 mg total) under the tongue every 5 (five) minutes as needed for chest pain. 25 tablet 0    omeprazole (PRILOSEC) 40 MG capsule Take 40 mg by mouth in the morning and at bedtime.      pantoprazole (PROTONIX) 40 MG tablet Take 1 tablet (40 mg total) by mouth  daily. 30 tablet 0    predniSONE (STERAPRED UNI-PAK 21 TAB) 10 MG (21) TBPK tablet Use per package instructions (Patient taking differently: Take 10-60 mg by mouth as directed. Take 6 tablets on Day 1 Take 5 tablets on Day 2 Take 4 tablets on Day 3 Take 3 tablets on Day 4 Take 2 tablets on Day 5 Take 1 tablets on Day 6  Use per package instructions) 21 tablet 0    traZODone (DESYREL) 150 MG tablet Take 150 mg by mouth at bedtime.      XTAMPZA ER 13.5 MG C12A Take 1 capsule by mouth 2 (two) times daily as needed (pain).      Scheduled:  Chlorhexidine Gluconate Cloth  6 each Topical Daily   heparin  5,000 Units Subcutaneous Q8H   insulin aspart  0-15 Units Subcutaneous Q4H   Continuous:  sodium chloride 10 mL/hr at 04/21/22 0331   calcium gluconate     levETIRAcetam     levETIRAcetam     niCARDipine 3 mg/hr (04/21/22 0320)    ROS:                                                                                                                                       Unable to obtain due to AMS.    Blood pressure (!) 143/90, pulse (!) 140, temperature 99.2 F (37.3 C), temperature source Rectal, resp. rate 11, SpO2 97 %.   General Examination:                                                                                                       Physical Exam  HEENT-  [place]/AT. No nuchal rigidity   Lungs- Respirations unlabored Extremities- No edema  Neurological  Examination Mental Status: Asleep initially, the patient awakens briefly to loud calling of her voice and light sternal rub; she will briefly look at examiner for < 1 second in the context of erratic conjugate eye movements, then will rapidly fall asleep again. She does not follow commands except when asked to squeeze examiner's hands with her right and left hands. Response to noxious consists of semipurposeful arm movements and withdrawal of extremities, but no purposeful movements.  Cranial Nerves: II: Pupils equal, round  and reactive. Briefly glances at examiner after noxious stimulus.   III,IV, VI: Eyes are conjugate with erratic EOM when briefly aroused. Doll's eye reflex is suppressed. No nystagmus.  V,VII: Face symmetric. Corneals intact.  VIII: Hearing intact to "squeeze my hands" IX,X: Unable to assess due to recent vomiting XI: Head is midline XII: Not following command Motor/Sensory: Weakly grips examiner's hands bilaterally to command. Otherwise not following commands. Moves BUE semipurposefully to pinch and sternal rub. Withdraws BLE weakly but briskly to noxious plantar stimulation.  Deep Tendon Reflexes: Normoactive. Negative Hoffman's bilaterally. Toes equivocal bilaterally  Cerebellar/Gait: Unable to assess.     Lab Results: Basic Metabolic Panel: Recent Labs  Lab 04/14/22 1856 04/20/22 1207 04/20/22 1249  NA 136 138 139  K 4.3 3.5 3.7  CL 105 101 100  CO2 21* 24  --   GLUCOSE 153* 200* 207*  BUN '19 14 16  ' CREATININE 1.62* 1.28* 1.10*  CALCIUM 8.8* 7.5*  --     CBC: Recent Labs  Lab 04/14/22 1856 04/20/22 1207 04/20/22 1249  WBC 10.8* 10.3  --   NEUTROABS  --  6.9  --   HGB 12.6 13.1 14.6  HCT 39.4 40.6 43.0  MCV 92.3 91.2  --   PLT 281 276  --     Cardiac Enzymes: No results for input(s): CKTOTAL, CKMB, CKMBINDEX, TROPONINI in the last 168 hours.  Lipid Panel: No results for input(s): CHOL, TRIG, HDL, CHOLHDL, VLDL, LDLCALC in the last 168 hours.  Imaging: CT HEAD WO CONTRAST (5MM)  Result Date: 04/20/2022 CLINICAL DATA:  Nonspecific dizziness EXAM: CT HEAD WITHOUT CONTRAST TECHNIQUE: Contiguous axial images were obtained from the base of the skull through the vertex without intravenous contrast. RADIATION DOSE REDUCTION: This exam was performed according to the departmental dose-optimization program which includes automated exposure control, adjustment of the mA and/or kV according to patient size and/or use of iterative reconstruction technique. COMPARISON:   01/20/2017 FINDINGS: Brain: No acute infarct or hemorrhage. Lateral ventricles and midline structures are unremarkable. No acute extra-axial fluid collections. No mass effect. Vascular: No hyperdense vessel or unexpected calcification. Skull: Normal. Negative for fracture or focal lesion. Sinuses/Orbits: No acute finding. Other: None. IMPRESSION: 1. No acute intracranial process.  Stable exam. Electronically Signed   By: Randa Ngo M.D.   On: 04/20/2022 15:18   DG Chest Port 1 View  Result Date: 04/20/2022 CLINICAL DATA:  Pt poor historian. Per PA: Pt complains of shortness of breath. Participates minimally in history, vomiting (clear) in triage, upper abdominal discomfort. EXAM: PORTABLE CHEST - 1 VIEW COMPARISON:  04/14/2022 FINDINGS: Perihilar and bibasilar interstitial edema or infiltrates, worse on right than left, new since previous. Heart size and mediastinal contours are within normal limits. No effusion. Cervical fixation hardware noted. IMPRESSION: New asymmetric perihilar and bibasilar infiltrates or edema, right worse than left. Electronically Signed   By: Lucrezia Europe M.D.   On: 04/20/2022 14:09   CT Angio Chest/Abd/Pel for Dissection W and/or Wo Contrast  Result Date: 04/20/2022 CLINICAL DATA:  Epigastric discomfort with nausea, vomiting, and shortness of breath. Hypertensive. EXAM: CT ANGIOGRAPHY CHEST, ABDOMEN AND PELVIS TECHNIQUE: Non-contrast CT of the chest was initially obtained. Multidetector CT imaging through the chest, abdomen and pelvis was performed using the standard protocol during bolus administration of intravenous contrast. Multiplanar reconstructed images and MIPs were obtained and reviewed to evaluate the vascular anatomy. RADIATION DOSE REDUCTION: This exam was performed according to the departmental dose-optimization program which includes automated exposure control, adjustment of the mA and/or kV according to patient size and/or use of iterative reconstruction technique.  CONTRAST:  152m OMNIPAQUE IOHEXOL 350 MG/ML SOLN COMPARISON:  CT chest, abdomen, and pelvis dated April 08, 2022. FINDINGS: CTA CHEST FINDINGS Cardiovascular: Preferential opacification of the thoracic aorta. No evidence of thoracic aortic aneurysm or dissection. Normal heart size. Unchanged small pericardial effusion. No central pulmonary embolism. Mediastinum/Nodes: No enlarged mediastinal, hilar, or axillary lymph nodes. Thyroid gland, trachea, and esophagus demonstrate no significant findings. Lungs/Pleura: Minimal subsegmental atelectasis at both lung bases. No focal consolidation, pleural effusion, or pneumothorax. Musculoskeletal: Chronic left breast seroma is unchanged, presumably postsurgical given history of prior lumpectomy. No acute or significant osseous findings. Review of the MIP images confirms the above findings. CTA ABDOMEN AND PELVIS FINDINGS VASCULAR Aorta: Normal caliber aorta without aneurysm, dissection, vasculitis or significant stenosis. Unchanged calcified and noncalcified atherosclerotic plaque. Celiac: Patent with similar mild stenosis of the proximal artery. No evidence of aneurysm, dissection, or vasculitis. SMA: Patent without evidence of aneurysm, dissection, vasculitis or significant stenosis. Renals: Unchanged severe stenosis of the right renal artery origin. The left renal artery is unremarkable. IMA: Occluded. 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 Veins: No obvious venous abnormality within the limitations of this arterial phase study. Review of the MIP images confirms the above findings. NON-VASCULAR Hepatobiliary: No focal liver abnormality is seen. No gallstones, gallbladder wall thickening, or biliary dilatation. Pancreas: Unremarkable. No pancreatic ductal dilatation or surrounding inflammatory changes. Spleen: Normal in size without focal abnormality. Adrenals/Urinary Tract: Adrenal glands are unremarkable. Similar patchy  cortical hypodensity in the upper pole of the right kidney with increasing small amount of perinephric fluid. The left kidney is unremarkable. No hydronephrosis. Bladder is unremarkable. Stomach/Bowel: Unchanged small hiatal hernia. The stomach is otherwise within normal limits. No bowel wall thickening, distention, or surrounding inflammatory changes. Normal appendix. Lymphatic: No enlarged abdominal or pelvic lymph nodes. Reproductive: Status post hysterectomy. No adnexal masses. Other: No abdominal wall hernia or abnormality. No abdominopelvic ascites. No pneumoperitoneum. Musculoskeletal: No acute or significant osseous findings. Chronic bilateral sacroiliitis. Review of the MIP images confirms the above findings. IMPRESSION: 1. No evidence of acute aortic syndrome or thoracoabdominal aortic aneurysm. 2. Similar patchy cortical hypodensity in the upper pole of the right kidney with increasing small amount of perinephric fluid, concerning for pyelonephritis versus infarct. 3. Unchanged severe stenosis of the right renal artery origin. 4. Chronically occluded IMA, left internal iliac artery, and proximal left SFA. 5. Unchanged small pericardial effusion. Electronically Signed   By: WTitus DubinM.D.   On: 04/20/2022 17:39     Assessment: 56year old female presenting with encephalopathy and seizure-like activity in the context of severe HTN 1. Exam reveals a severely encephalopathic patient without lateralized weakness, eye deviation, pupillary asymmetry, facial droop or clinical seizure activity.  2. CT head: No acute abnormality.  3. Most likely etiology for her presentation is malignant HTN with hypertensive encephalopathy versus PRES.   Recommendations: 1.  3000 mg IV Keppra load x 1, then continue scheduled dosing at 1000 mg IV BID 2. EEG 3. STAT MRI brain 4. Seizure precautions.  5. BP management with initial goal SBP < 160 6. Frequent neuro checks 7. Given that she takes Xanax at home for  anxiety, withdrawal is also on the DDx. Would start CIWA protocol and manage with Ativan.   8. Discussed with CCM  40 minutes spent in the neurological evaluation and management of this critically ill patient.    Electronically signed: Dr. Kerney Elbe 04/21/2022, 3:24 AM

## 2022-04-21 NOTE — ED Provider Notes (Signed)
?  Physical Exam  ?BP (!) 243/134   Pulse (!) 116   Temp 99.2 ?F (37.3 ?C) (Rectal)   Resp (!) 37   SpO2 98%  ? ?Physical Exam ? ?Procedures  ?Procedures ? ?ED Course / MDM  ? ?Clinical Course as of 04/21/22 0040  ?Tue Apr 21, 2022  ?0030 Consult to CC Dr. Marcos Eke, who is agreeable to seeing this patient and admitting her to her service. Will consult neurology to evaluate the patient as well. I appreciate her collaboration in the care of this patient.  [RS]  ?[id number] Consult to Dr. Otelia Limes, neurologist who states that even though stroke is in the differential, goal SBP <180. He is agreeable to seeing the patient. I appreciate his collaboration in the care of this patient.  [RS]  ?  ?Clinical Course User Index ?[RS] Shatisha Falter, Eugene Gavia, PA-C  ? ?Medical Decision Making ?Amount and/or Complexity of Data Reviewed ?Labs: ordered. ?Radiology: ordered. ? ?Risk ?Prescription drug management. ?Decision regarding hospitalization. ? ? ? ?Care of this patient initially completed by day and evening teams after presenting with concern for multiple complaints.  She had dizziness, lightheadedness, chest pain, and intractable nausea and vomiting.  Had acute decompensation of her mental status while in the emergency department, with normal CT of the head. Concern for stroke, but did not tolerate MRI due to persistent vomiting.  ? ?Progressively began to become more hypertensive and tachycardic, and tachypneic.  Case was discussed with hospital medicine team prior to my arrival to the emergency department.  Subsequently patient was evaluated by hospital medicine provider who did not feel she was appropriate for his level of care due to severity of alteration in her mental status and exquisite hypertension. ? ?Critical care was consulted as well as neurology for concern for press syndrome.  Per neurology goal SBP less than 180.  Patient evaluated bedside by me and ED attending.  She is oriented to person and place, arousable to  voice, following commands to look at this provider and blank.  Will nod yes or no to questions but would not verbally respond to more questions for this provider. ? ?Started on nicardipine drip to be admitted to critical care. ? ?This chart was dictated using voice recognition software, Dragon. Despite the best efforts of this provider to proofread and correct errors, errors may still occur which can change documentation meaning. ? ? ? ? ?  ?Paris Lore, PA-C ?04/21/22 4562 ? ?  ?Marily Memos, MD ?04/21/22 0215 ? ?

## 2022-04-21 NOTE — H&P (Signed)
 NAME:  Erin Jackson, MRN:  [id number], DOB:  12/01/1966, LOS: 0 ADMISSION DATE:  04/20/2022, CONSULTATION DATE:  04/21/22 1230a REFERRING MD:  ED , CHIEF COMPLAINT:  Nausea, vomiting, weakness; AMS   History of Present Illness:  56 yo woman with hx of HTN, DM, Anxiety, GERD, here with:   L face numbness Dizzy when walking Nausea and vomiting x 2 weeks.  SOB when vomiting.  Dysuria Headache Abdominal pain  Initial BP 150/114 now 239/116 (>200 systolic intermittently since 1600). Reported to ED that she did not take her BP meds today.   Mental status has reportedly worsened over the course of the day and evening. CT head negative. Labs: cr actually improved from prior (1.1 was 1.62 prior visit).  Tachypnea, tachycardia --> started earlier in afternoon.  UDS pos for opiates and thc (was given morphine prior to drug screen).  CTA chest/abd/pelv obtained to r/o dissection. This was negative but showed hypodensity in upper pole of right kidney concerning for pyelo vs infarct (also seen on CTA 04/08/22), unchanged stenosis of right renal artery, chronically occluded IMA, left internal iliac artery, prox left SFA, unchanged small pericardial effusion. In ED received Benadryl, labetalol x 2, reglan, morphine at 1800, zofran at 1447, Nitroglycerine at 1900.  ceftriaxone, LR 1 L.   Started on cardene gtt at 0019.    Due to HTN, tachycardia, encephalopathy and currently on cardene, PCCM asked to admit to ICU.  Pertinent  Medical History  HTL  HTN Takes plavix GERD DM  Significant Hospital Events: Including procedures, antibiotic start and stop dates in addition to other pertinent events   5/2 admit.  Interim History / Subjective:  Awake but altered. Does not follow commands or answer questions. Withdraws to pain. On Cardene at 5.  Objective   Blood pressure (!) 239/116, pulse (!) 116, temperature 99.2 F (37.3 C), temperature source Rectal, resp. rate (!) 34, SpO2 93 %.       No  intake or output data in the 24 hours ending 04/21/22 0035 There were no vitals filed for this visit.  Examination: General: Adult female, resting in bed, in NAD. Neuro: Awake but altered. Does not follow commands or answer questions. Withdraws to pain. HEENT: [place]/AT. Sclerae anicteric. Cardiovascular: Tachy, regular, no M/R/G.  Lungs: Respirations even and unlabored.  CTA bilaterally, No W/R/R. Abdomen: BS x 4, soft, NT/ND.  Musculoskeletal: No gross deformities, no edema.  Skin: Intact, warm, no rashes.   Assessment & Plan:   Hypertensive urgency - unsure of compliance with home regime, did report missed meds 04/20/22. - Continue Cardene, goal SBP < 180. - PRN Lopressor. - Hold home Clonidine, Losartan, Lopressor and can resume when able to take PO safely.  Acute encephalopathy - ? 2/2 above. CT head negative, UA pending. UDS positive for THC and opiates (received Morphine prior). - Hold sedating meds. - Neuro consulted by EDP. - MRI brain when able.  Chest pain - has hx of similar with recent admission and LHC 4/20 with findings of multivessel CAD. - Repeat trop for now. - Supportive care.  AoCKD. - Supportive care. - Follow BMP.  Hx nausea and vomiting - has hx of same before. ? Cyclic vomiting syndrome from THC use. - Zofran PRN.  Hx nonobstructive CAD, small pericardial effusion, HTN, HLD, sCHF (EF 35-40%). - Follow up as outpatient with Dr. Kadakia. - Hold home Clonidine, Metoprolol, Losartan, Lasix, Farixa, Atorvastatin. - Resume home Plavix when able to take PO.  Hx DM. -   Hold home Metformin. - SSI.  Chronic patchy hypodensity of right kidney on CTA. - Consider renal CT vs MRI once over acute issues.  Best Practice (right click and "Reselect all SmartList Selections" daily)   Diet/type: NPO DVT prophylaxis: prophylactic heparin  GI prophylaxis: N/A Lines: N/A Foley:  N/A Code Status:  full code Last date of multidisciplinary goals of care discussion:  None yet.  Labs   CBC: Recent Labs  Lab 04/14/22 1856 04/20/22 1207 04/20/22 1249  WBC 10.8* 10.3  --   NEUTROABS  --  6.9  --   HGB 12.6 13.1 14.6  HCT 39.4 40.6 43.0  MCV 92.3 91.2  --   PLT 281 276  --     Basic Metabolic Panel: Recent Labs  Lab 04/14/22 1856 04/20/22 1207 04/20/22 1249  NA 136 138 139  K 4.3 3.5 3.7  CL 105 101 100  CO2 21* 24  --   GLUCOSE 153* 200* 207*  BUN _0 CREATININE 1.62* 1.28* 1.10*  CALCIUM 8.8* 7.5*  --    GFR: Estimated Creatinine Clearance: 59.8 mL/min (A) (by C-G formula based on SCr of 1.1 mg/dL (H)). Recent Labs  Lab 04/14/22 1856 04/20/22 1207 04/20/22 1837  WBC 10.8* 10.3  --   LATICACIDVEN  --   --  2.0*    Liver Function Tests: Recent Labs  Lab 04/20/22 1207  AST 19  ALT 13  ALKPHOS 65  BILITOT 0.4  PROT 7.7  ALBUMIN 3.1*   Recent Labs  Lab 04/20/22 1207  LIPASE 38   No results for input(s): AMMONIA in the last 168 hours.  ABG    Component Value Date/Time   TCO2 25 04/20/2022 1249     Coagulation Profile: No results for input(s): INR, PROTIME in the last 168 hours.  Cardiac Enzymes: No results for input(s): CKTOTAL, CKMB, CKMBINDEX, TROPONINI in the last 168 hours.  HbA1C: Hgb A1c MFr Bld  Date/Time Value Ref Range Status  04/09/2022 07:40 AM 6.5 (H) 4.8 - 5.6 % Final    Comment:    (NOTE) Pre diabetes:          5.7%-6.4%  Diabetes:              >6.4%  Glycemic control for   <7.0% adults with diabetes   06/18/2020 11:39 AM 8.0 (H) 4.8 - 5.6 % Final    Comment:    (NOTE)         Prediabetes: 5.7 - 6.4         Diabetes: >6.4         Glycemic control for adults with diabetes: <7.0     CBG: Recent Labs  Lab 04/20/22 1204 04/20/22 1644 04/20/22 2350  GLUCAP 179* 218* 256*    Review of Systems:   Unable to obtain as pt is encephalopathic.  Past Medical History:  She,  has a past medical history of Anxiety, Chronic back pain, Diabetes mellitus without complication  ([place]), GERD (gastroesophageal reflux disease), and Hypertension.   Surgical History:   Past Surgical History:  Procedure Laterality Date   ABDOMINAL HYSTERECTOMY     BREAST LUMPECTOMY Left 03/25/2022   Procedure: LEFT BREAST LUMPECTOMY;  Surgeon: Jovita Kussmaul, MD;  Location: [place];  Service: General;  Laterality: Left;   BREAST REDUCTION SURGERY Bilateral 08/15/2018   Procedure: BILATERAL MAMMARY REDUCTION  (BREAST);  Surgeon: Cristine Polio, MD;  Location: [place];  Service: Plastics;  Laterality: Bilateral;  BREAST REDUCTION SURGERY Left 09/25/2019   Procedure: EXCISION OF FAT NECROSIS OF LEFT BREAST;  Surgeon: Cristine Polio, MD;  Location: [facility];  Service: Plastics;  Laterality: Left;   CARPAL TUNNEL RELEASE Right 06/17/2020   right thumb CMC arthroplasty with tendon transfer and right carpal tunnel release.    CERVICAL SPINE SURGERY  2015   has a metal plate   FOOT SURGERY Left 2014   HAND SURGERY     LEFT HEART CATH AND CORONARY ANGIOGRAPHY N/A 04/09/2022   Procedure: LEFT HEART CATH AND CORONARY ANGIOGRAPHY;  Surgeon: Dixie Dials, MD;  Location: [facility];  Service: Cardiovascular;  Laterality: N/A;   REDUCTION MAMMAPLASTY Bilateral 07/2018     Social History:   reports that she has been smoking cigarettes. She has been smoking an average of .5 packs per day. She has never used smokeless tobacco. She reports current alcohol use. She reports that she does not use drugs.   Family History:  Her family history includes Breast cancer in her maternal aunt and maternal grandmother.   Allergies Allergies  Allergen Reactions   Citrus Shortness Of Breath   Cucumber Extract Anaphylaxis   Molds & Smuts Shortness Of Breath   Norvasc [Amlodipine] Other (See Comments)    Muscle became very tight   Apple Juice    Other Other (See Comments)    Tree nuts, walnuts   Aspirin Rash   Ibuprofen Rash   Niacin And  Related Rash     Home Medications  Prior to Admission medications   Medication Sig Start Date End Date Taking? Authorizing Provider  acetaminophen (TYLENOL) 500 MG tablet Take 500 mg by mouth every 6 (six) hours as needed for headache.    [provider]  albuterol (VENTOLIN HFA) 108 (90 Base) MCG/ACT inhaler Inhale 2 puffs into the lungs every 6 (six) hours as needed for wheezing or shortness of breath.    [provider]  alprazolam Duanne Moron) 2 MG tablet Take 0.5 tablets (1 mg total) by mouth 2 (two) times daily as needed for anxiety. 04/10/22   Elgergawy, Silver Huguenin, MD  atorvastatin (LIPITOR) 80 MG tablet Take 1 tablet (80 mg total) by mouth daily. 04/11/22   Elgergawy, Silver Huguenin, MD  cetirizine (ZYRTEC ALLERGY) 10 MG tablet Take 1 tablet (10 mg total) by mouth daily. 04/14/22   Valarie Merino, MD  cloNIDine (CATAPRES) 0.2 MG tablet Take 1 tablet (0.2 mg total) by mouth 2 (two) times daily. 04/10/22   Elgergawy, Silver Huguenin, MD  clopidogrel (PLAVIX) 75 MG tablet Take 1 tablet (75 mg total) by mouth daily. 04/10/22   Elgergawy, Silver Huguenin, MD  EPINEPHRINE 0.3 mg/0.3 mL IJ SOAJ injection Inject 0.3 mLs (0.3 mg total) into the muscle once as needed (allergic reaction). Patient taking differently: Inject 0.3 mg into the muscle as needed for anaphylaxis (allergic reaction). 10/04/18   Fabian November, MD  HYDROcodone-acetaminophen (NORCO) 10-325 MG tablet Take 1 tablet by mouth in the morning and at bedtime. 03/20/22   [provider]  losartan (COZAAR) 25 MG tablet Take 1 tablet (25 mg total) by mouth daily. 04/11/22   Elgergawy, Silver Huguenin, MD  metFORMIN (GLUCOPHAGE) 500 MG tablet Take 1 tablet (500 mg total) by mouth 2 (two) times daily with a meal. 04/12/22   Elgergawy, Silver Huguenin, MD  metoprolol tartrate (LOPRESSOR) 50 MG tablet Take 1 tablet (50 mg total) by mouth 2 (two) times daily. 04/10/22   Elgergawy, Silver Huguenin, MD  NARCAN 4 MG/0.1ML LIQD nasal spray kit Place 0.4 mg into the nose as  needed (opoid overdose). 01/08/20   [provider]  nicotine (NICODERM CQ - DOSED IN MG/24 HOURS) 14 mg/24hr patch Place 1 patch (14 mg total) onto the skin daily. 04/11/22   Elgergawy, Silver Huguenin, MD  nitroGLYCERIN (NITROSTAT) 0.4 MG SL tablet Place 1 tablet (0.4 mg total) under the tongue every 5 (five) minutes as needed for chest pain. 04/10/22   Elgergawy, Silver Huguenin, MD  omeprazole (PRILOSEC) 40 MG capsule Take 40 mg by mouth in the morning and at bedtime.    [provider]  pantoprazole (PROTONIX) 40 MG tablet Take 1 tablet (40 mg total) by mouth daily. 04/11/22   Elgergawy, Silver Huguenin, MD  predniSONE (STERAPRED UNI-PAK 21 TAB) 10 MG (21) TBPK tablet Use per package instructions Patient taking differently: Take 10-60 mg by mouth as directed. Take 6 tablets on Day 1 Take 5 tablets on Day 2 Take 4 tablets on Day 3 Take 3 tablets on Day 4 Take 2 tablets on Day 5 Take 1 tablets on Day 6  Use per package instructions 04/10/22   Elgergawy, Silver Huguenin, MD  traZODone (DESYREL) 150 MG tablet Take 150 mg by mouth at bedtime.    [provider]  XTAMPZA ER 13.5 MG C12A Take 1 capsule by mouth 2 (two) times daily as needed (pain). 03/20/22   [provider]     Critical care time: 35 min.    Montey Hora, [facility] For pager details, please see AMION or use Epic chat  After 1900, please call [facility] for cross coverage needs 04/21/2022, 1:34 AM

## 2022-04-21 NOTE — Progress Notes (Signed)
Patient arrived from [facility] with the following belongings: phone, wallet, keys, glasses and purse. All belongings placed in the room at the bedside with patient.  ? ? ?

## 2022-04-21 NOTE — Progress Notes (Signed)
eLink Physician-Brief Progress Note ?Patient Name: Erin Jackson ?DOB: March 27, 1966 ?MRN: [id number] ? ? ?Date of Service ? 04/21/2022  ?HPI/Events of Note ? Mg++ < 0.5.  ?eICU Interventions ? Magnesium replacement ordered per E-Link electrolyte replacement protocol.  ? ? ? ?  ? ?Thomasene Lot Pharaoh Pio ?04/21/2022, 4:01 AM ?

## 2022-04-21 NOTE — Progress Notes (Signed)
eLink Physician-Brief Progress Note ?Patient Name: Erin Jackson ?DOB: 1966/06/23 ?MRN: [id number] ? ? ?Date of Service ? 04/21/2022  ?HPI/Events of Note ? Patient asking for a sleep aid.  ?eICU Interventions ? PRN Melatonin ordered.  ? ? ? ?  ? ?Thomasene Lot Meika Earll ?04/21/2022, 9:38 PM ?

## 2022-04-21 NOTE — Progress Notes (Signed)
NAME:  Erin Jackson, MRN:  [id number], DOB:  1966/11/25, LOS: 0 ADMISSION DATE:  04/20/2022, CONSULTATION DATE:  04/21/22 1230a REFERRING MD:  ED , CHIEF COMPLAINT:  Nausea, vomiting, weakness; AMS   History of Present Illness:  56 yo woman with hx of HTN, DM, Anxiety, GERD, here with:   L face numbness Dizzy when walking Nausea and vomiting x 2 weeks.  SOB when vomiting.  Dysuria Headache Abdominal pain  Initial BP 150/114 now 384/536 (>468 systolic intermittently since 1600). Reported to ED that she did not take her BP meds today.   Mental status has reportedly worsened over the course of the day and evening. CT head negative. Labs: cr actually improved from prior (1.1 was 1.62 prior visit).  Tachypnea, tachycardia --> started earlier in afternoon.  UDS pos for opiates and thc (was given morphine prior to drug screen).  CTA chest/abd/pelv obtained to r/o dissection. This was negative but showed hypodensity in upper pole of right kidney concerning for pyelo vs infarct (also seen on CTA 04/08/22), unchanged stenosis of right renal artery, chronically occluded IMA, left internal iliac artery, prox left SFA, unchanged small pericardial effusion. In ED received Benadryl, labetalol x 2, reglan, morphine at 1800, zofran at 1447, Nitroglycerine at 1900.  ceftriaxone, LR 1 L.   Started on cardene gtt at [facility].    Due to HTN, tachycardia, encephalopathy and currently on cardene, PCCM asked to admit to ICU.  Pertinent  Medical History  HTL  HTN Takes plavix GERD DM  Significant Hospital Events: Including procedures, antibiotic start and stop dates in addition to other pertinent events   5/2 admit.  Interim History / Subjective:  Overnight, patient had seizure-like activity with concern for aspiration. Neurology consulted.  Objective   Blood pressure 122/73, pulse (!) 112, temperature 99.2 F (37.3 C), temperature source Rectal, resp. rate (!) 30, weight 85.5 kg, SpO2 98 %.         Intake/Output Summary (Last 24 hours) at 04/21/2022 0321 Last data filed at 04/21/2022 2248 Gross per 24 hour  Intake 629.2 ml  Output 600 ml  Net 29.2 ml   Filed Weights   04/21/22 0500  Weight: 85.5 kg    Examination: General: Adult female, resting in bed, in NAD. Neuro: Awake but altered, follows some commands. Moves all four extremities  HEENT: [place]/AT. Sclerae anicteric. Cardiovascular: Tachycardic, regular rhythm. 2/6 systolic murmur appreciated. Distal pulses 0+ on L, 1+ on R. Lungs: Respirations even and unlabored.  Clear to ausculation bilaterally. Abdomen: BS x 4, soft, NT/ND.  Musculoskeletal: No gross deformities, no edema.  Skin: Intact, warm, no rashes.  Assessment & Plan:   #Hypertensive emergency After initiation of cardizem gtt, BP has decreased overnight, last SBP 150's. Patient is awake, although given significant vomiting and seizure-like activity would continue with IV anti-hypertensive today. Hopefully can re-start home medications within next 1-2 days. - Continue nicardipine gtt, goal SBP <180 - Hold home antihypertensives - Daily BMP  #Acute encephalopathy  #Seizure-like activity This morning, patient was able to respond to questions, although still appears altered. Was able to follow some commands, no focal neurologic deficits appreciated. No metabolic causes appreciated. Differential includes hypertensive encephalopathy vs PRES. Overnight, neurology was consulted and patient was given Keppra load. Will follow-up with EEG and imaging. - IV Keppra 1017m BID - Follow-up EEG, MRI brain - Seizure precautions  #Chronic kidney disease #Anion gap metabolic acidosis Renal function similar to baseline, sCr 1.32. This AM, found to have new anion  gap metabolic acidosis and sodium bicarb gtt was initiated. Most likely due to elevated lactic likely from seizure-like activity. Can also consider infection w/ aspiration event, DKA, although these seem less likely right  now. Will repeat BMP this PM.  - Sodium bicarb gtt - Repeat BMP this afternoon  #Type II diabetes mellitus Sugars significantly elevated, 200-300's overnight. Currently only receiving sliding scale. Will add long-acting insulin as well. - Start semglee 8 units daily - SSI  #Aspiration event Overnight, patient was noted to have seizure-like activity with aspiration event. WBC afterward increased to 17. This morning, she is intermittently on room air/2L supplemental oxygen, sating well. No abnormal lung sounds on exam. Will continue to monitor, if has increased oxygen requirement or starts to have fevers, will start Unasyn.  #Nausea, vomiting  Has history of THC use, cyclic vomiting syndrome. Current nausea and vomiting more likely to be due to hypertensive emergency/PRES. Less likely DKA given AGMA was new a few hours after arrival. Will continue with phenergan as needed. - Phenergan 12.31m every 6 hours as needed  #Hypomagnesemia Mg <0.5 this AM, repleting. Will r/p again later today.   #Chronic heart failure with reduced ejection fraction (EF 35-40%) #Non-obstructive coronary artery disease Appears euvolemic, holding home GDMT, can resume once clinically improved. Will need to monitor volume status daily.   #Peripheral vascular disease  During most recent hospitalization found to have severe arterial disease in L, mild disease in R. Per chart review was having claudication symptoms. Vascular consulted at that time, no role for emergent revascularization. Will hold PO meds at this time. - Hold home statin, plavix  #Chest pain  Has hx of similar with recent admission and LHC 4/20 with findings of multivessel CAD. Trop 11>22>19. Likely due to hypertensive emergency.   #Chronic patchy hypodensity of right kidney on CTA. - Consider renal CT vs MRI once over acute issues.  Best Practice (right click and "Reselect all SmartList Selections" daily)   Diet/type: NPO DVT prophylaxis:  prophylactic heparin  GI prophylaxis: N/A Lines: N/A Foley:  N/A Code Status:  full code Last date of multidisciplinary goals of care discussion: None yet.  Labs   CBC: Recent Labs  Lab 04/14/22 1856 04/20/22 1207 04/20/22 1249 04/21/22 0343  WBC 10.8* 10.3  --  17.7*  NEUTROABS  --  6.9  --   --   HGB 12.6 13.1 14.6 14.1  HCT 39.4 40.6 43.0 42.9  MCV 92.3 91.2  --  86.8  PLT 281 276  --  354     Basic Metabolic Panel: Recent Labs  Lab 04/14/22 1856 04/20/22 1207 04/20/22 1249 04/21/22 0226  NA 136 138 139 138  K 4.3 3.5 3.7 3.7  CL 105 101 100 101  CO2 21* 24  --  17*  GLUCOSE 153* 200* 207* 322*  BUN _0 CREATININE 1.62* 1.28* 1.10* 1.32*  CALCIUM 8.8* 7.5*  --  7.2*  MG  --   --   --  <0.5*  PHOS  --   --   --  5.1*    GFR: Estimated Creatinine Clearance: 47.2 mL/min (A) (by C-G formula based on SCr of 1.32 mg/dL (H)). Recent Labs  Lab 04/14/22 1856 04/20/22 1207 04/20/22 1837 04/21/22 0048 04/21/22 0343  WBC 10.8* 10.3  --   --  17.7*  LATICACIDVEN  --   --  2.0* 1.7 2.0*     Liver Function Tests: Recent Labs  Lab 04/20/22 1207  AST 19  ALT 13  ALKPHOS 65  BILITOT 0.4  PROT 7.7  ALBUMIN 3.1*    Recent Labs  Lab 04/20/22 1207  LIPASE 38    No results for input(s): AMMONIA in the last 168 hours.  ABG    Component Value Date/Time   TCO2 25 04/20/2022 1249      Coagulation Profile: No results for input(s): INR, PROTIME in the last 168 hours.  Cardiac Enzymes: No results for input(s): CKTOTAL, CKMB, CKMBINDEX, TROPONINI in the last 168 hours.  HbA1C: Hgb A1c MFr Bld  Date/Time Value Ref Range Status  04/09/2022 07:40 AM 6.5 (H) 4.8 - 5.6 % Final    Comment:    (NOTE) Pre diabetes:          5.7%-6.4%  Diabetes:              >6.4%  Glycemic control for   <7.0% adults with diabetes   06/18/2020 11:39 AM 8.0 (H) 4.8 - 5.6 % Final    Comment:    (NOTE)         Prediabetes: 5.7 - 6.4         Diabetes: >6.4          Glycemic control for adults with diabetes: <7.0     CBG: Recent Labs  Lab 04/20/22 1204 04/20/22 1644 04/20/22 2350 04/21/22 0204 04/21/22 0308  GLUCAP 179* 218* 256* 292* 302*     Review of Systems:   Unable to obtain as pt is encephalopathic.  Past Medical History:  She,  has a past medical history of Anxiety, Chronic back pain, Diabetes mellitus without complication ([place]), GERD (gastroesophageal reflux disease), and Hypertension.   Surgical History:   Past Surgical History:  Procedure Laterality Date   ABDOMINAL HYSTERECTOMY     BREAST LUMPECTOMY Left 03/25/2022   Procedure: LEFT BREAST LUMPECTOMY;  Surgeon: Jovita Kussmaul, MD;  Location: [facility];  Service: General;  Laterality: Left;   BREAST REDUCTION SURGERY Bilateral 08/15/2018   Procedure: BILATERAL MAMMARY REDUCTION  (BREAST);  Surgeon: Cristine Polio, MD;  Location: [facility];  Service: Plastics;  Laterality: Bilateral;   BREAST REDUCTION SURGERY Left 09/25/2019   Procedure: EXCISION OF FAT NECROSIS OF LEFT BREAST;  Surgeon: Cristine Polio, MD;  Location: [facility];  Service: Plastics;  Laterality: Left;   CARPAL TUNNEL RELEASE Right 06/17/2020   right thumb CMC arthroplasty with tendon transfer and right carpal tunnel release.    CERVICAL SPINE SURGERY  2015   has a metal plate   FOOT SURGERY Left 2014   HAND SURGERY     LEFT HEART CATH AND CORONARY ANGIOGRAPHY N/A 04/09/2022   Procedure: LEFT HEART CATH AND CORONARY ANGIOGRAPHY;  Surgeon: Dixie Dials, MD;  Location: [facility];  Service: Cardiovascular;  Laterality: N/A;   REDUCTION MAMMAPLASTY Bilateral 07/2018     Social History:   reports that she has been smoking cigarettes. She has been smoking an average of .5 packs per day. She has never used smokeless tobacco. She reports current alcohol use. She reports that she does not use drugs.   Family History:  Her family history includes  Breast cancer in her maternal aunt and maternal grandmother.   Allergies Allergies  Allergen Reactions   Citrus Shortness Of Breath   Cucumber Extract Anaphylaxis   Molds & Smuts Shortness Of Breath   Norvasc [Amlodipine] Other (See Comments)    Muscle became very tight   Apple Juice    Other  Other (See Comments)    Tree nuts, walnuts   Aspirin Rash   Ibuprofen Rash   Niacin And Related Rash     Home Medications  Prior to Admission medications   Medication Sig Start Date End Date Taking? Authorizing Provider  acetaminophen (TYLENOL) 500 MG tablet Take 500 mg by mouth every 6 (six) hours as needed for headache.    [provider]  albuterol (VENTOLIN HFA) 108 (90 Base) MCG/ACT inhaler Inhale 2 puffs into the lungs every 6 (six) hours as needed for wheezing or shortness of breath.    [provider]  alprazolam Duanne Moron) 2 MG tablet Take 0.5 tablets (1 mg total) by mouth 2 (two) times daily as needed for anxiety. 04/10/22   Elgergawy, Silver Huguenin, MD  atorvastatin (LIPITOR) 80 MG tablet Take 1 tablet (80 mg total) by mouth daily. 04/11/22   Elgergawy, Silver Huguenin, MD  cetirizine (ZYRTEC ALLERGY) 10 MG tablet Take 1 tablet (10 mg total) by mouth daily. 04/14/22   Valarie Merino, MD  cloNIDine (CATAPRES) 0.2 MG tablet Take 1 tablet (0.2 mg total) by mouth 2 (two) times daily. 04/10/22   Elgergawy, Silver Huguenin, MD  clopidogrel (PLAVIX) 75 MG tablet Take 1 tablet (75 mg total) by mouth daily. 04/10/22   Elgergawy, Silver Huguenin, MD  EPINEPHRINE 0.3 mg/0.3 mL IJ SOAJ injection Inject 0.3 mLs (0.3 mg total) into the muscle once as needed (allergic reaction). Patient taking differently: Inject 0.3 mg into the muscle as needed for anaphylaxis (allergic reaction). 10/04/18   Fabian November, MD  HYDROcodone-acetaminophen (NORCO) 10-325 MG tablet Take 1 tablet by mouth in the morning and at bedtime. 03/20/22   [provider]  losartan (COZAAR) 25 MG tablet Take 1 tablet (25 mg total) by mouth  daily. 04/11/22   Elgergawy, Silver Huguenin, MD  metFORMIN (GLUCOPHAGE) 500 MG tablet Take 1 tablet (500 mg total) by mouth 2 (two) times daily with a meal. 04/12/22   Elgergawy, Silver Huguenin, MD  metoprolol tartrate (LOPRESSOR) 50 MG tablet Take 1 tablet (50 mg total) by mouth 2 (two) times daily. 04/10/22   Elgergawy, Silver Huguenin, MD  NARCAN 4 MG/0.1ML LIQD nasal spray kit Place 0.4 mg into the nose as needed (opoid overdose). 01/08/20   [provider]  nicotine (NICODERM CQ - DOSED IN MG/24 HOURS) 14 mg/24hr patch Place 1 patch (14 mg total) onto the skin daily. 04/11/22   Elgergawy, Silver Huguenin, MD  nitroGLYCERIN (NITROSTAT) 0.4 MG SL tablet Place 1 tablet (0.4 mg total) under the tongue every 5 (five) minutes as needed for chest pain. 04/10/22   Elgergawy, Silver Huguenin, MD  omeprazole (PRILOSEC) 40 MG capsule Take 40 mg by mouth in the morning and at bedtime.    [provider]  pantoprazole (PROTONIX) 40 MG tablet Take 1 tablet (40 mg total) by mouth daily. 04/11/22   Elgergawy, Silver Huguenin, MD  predniSONE (STERAPRED UNI-PAK 21 TAB) 10 MG (21) TBPK tablet Use per package instructions Patient taking differently: Take 10-60 mg by mouth as directed. Take 6 tablets on Day 1 Take 5 tablets on Day 2 Take 4 tablets on Day 3 Take 3 tablets on Day 4 Take 2 tablets on Day 5 Take 1 tablets on Day 6  Use per package instructions 04/10/22   Elgergawy, Silver Huguenin, MD  traZODone (DESYREL) 150 MG tablet Take 150 mg by mouth at bedtime.    [provider]  XTAMPZA ER 13.5 MG C12A Take 1 capsule by mouth  2 (two) times daily as needed (pain). 03/20/22   [provider]     Critical care time: 45 min.   Sanjuan Dame, MD Internal Medicine PGY-2 Pager: [phone]

## 2022-04-21 NOTE — Progress Notes (Signed)
eLink Physician-Brief Progress Note ?Patient Name: UBAH LAIDLEY ?DOB: 04/26/1966 ?MRN: [id number] ? ? ?Date of Service ? 04/21/2022  ?HPI/Events of Note ? Patient admitted with uncontrolled hypertension, altered mental status, focal neurological findings, CT and CTA not consistent with CVA, concern for PRES syndrome, patient has seizure-like activity followed by vomiting.  ?eICU Interventions ? New Patient Evaluation, Neurology consultation ( I spoke with Dr. Cheral Marker), cEEG, neuro-workup, Cardene gtt for BP control. Follow up CXR to r/o aspiration.  ? ? ? ?  ? ?Kerry Kass Odelle Kosier ?04/21/2022, 3:20 AM ?

## 2022-04-21 NOTE — Progress Notes (Signed)
EEG complete - results pending 

## 2022-04-22 ENCOUNTER — Inpatient Hospital Stay (HOSPITAL_COMMUNITY): Payer: Medicare Other

## 2022-04-22 DIAGNOSIS — I160 Hypertensive urgency: Secondary | ICD-10-CM | POA: Diagnosis not present

## 2022-04-22 DIAGNOSIS — I6389 Other cerebral infarction: Secondary | ICD-10-CM | POA: Diagnosis not present

## 2022-04-22 DIAGNOSIS — F17200 Nicotine dependence, unspecified, uncomplicated: Secondary | ICD-10-CM | POA: Diagnosis not present

## 2022-04-22 DIAGNOSIS — F1210 Cannabis abuse, uncomplicated: Secondary | ICD-10-CM

## 2022-04-22 DIAGNOSIS — I6340 Cerebral infarction due to embolism of unspecified cerebral artery: Secondary | ICD-10-CM | POA: Insufficient documentation

## 2022-04-22 LAB — BASIC METABOLIC PANEL
Anion gap: 11 (ref 5–15)
BUN: 25 mg/dL — ABNORMAL HIGH (ref 6–20)
CO2: 28 mmol/L (ref 22–32)
Calcium: 7 mg/dL — ABNORMAL LOW (ref 8.9–10.3)
Chloride: 100 mmol/L (ref 98–111)
Creatinine, Ser: 2.17 mg/dL — ABNORMAL HIGH (ref 0.44–1.00)
GFR, Estimated: 26 mL/min — ABNORMAL LOW (ref >60)
Glucose, Bld: 124 mg/dL — ABNORMAL HIGH (ref 70–99)
Potassium: 3 mmol/L — ABNORMAL LOW (ref 3.5–5.1)
Sodium: 139 mmol/L (ref 135–145)

## 2022-04-22 LAB — CBC
HCT: 32.7 % — ABNORMAL LOW (ref 36.0–46.0)
Hemoglobin: 10.7 g/dL — ABNORMAL LOW (ref 12.0–15.0)
MCH: 29.1 pg (ref 26.0–34.0)
MCHC: 32.7 g/dL (ref 30.0–36.0)
MCV: 88.9 fL (ref 80.0–100.0)
Platelets: 246 10*3/uL (ref 150–400)
RBC: 3.68 MIL/uL — ABNORMAL LOW (ref 3.87–5.11)
RDW: 13.6 % (ref 11.5–15.5)
WBC: 11.6 10*3/uL — ABNORMAL HIGH (ref 4.0–10.5)
nRBC: 0 % (ref 0.0–0.2)

## 2022-04-22 LAB — LIPID PANEL
Cholesterol: 170 mg/dL (ref 0–200)
HDL: 23 mg/dL — ABNORMAL LOW (ref >40)
LDL Cholesterol: 102 mg/dL — ABNORMAL HIGH (ref 0–99)
Total CHOL/HDL Ratio: 7.4 RATIO
Triglycerides: 225 mg/dL — ABNORMAL HIGH (ref <150)
VLDL: 45 mg/dL — ABNORMAL HIGH (ref 0–40)

## 2022-04-22 LAB — ECHOCARDIOGRAM LIMITED BUBBLE STUDY
AR max vel: 1.51 cm2
AV Peak grad: 14.4 mmHg
Ao pk vel: 1.9 m/s
Area-P 1/2: 4.83 cm2
Calc EF: 36.8 %
P 1/2 time: 470 msec
S' Lateral: 3.5 cm
Single Plane A2C EF: 37.2 %
Single Plane A4C EF: 37.3 %

## 2022-04-22 LAB — PHOSPHORUS: Phosphorus: 5.4 mg/dL — ABNORMAL HIGH (ref 2.5–4.6)

## 2022-04-22 LAB — GLUCOSE, CAPILLARY
Glucose-Capillary: 117 mg/dL — ABNORMAL HIGH (ref 70–99)
Glucose-Capillary: 130 mg/dL — ABNORMAL HIGH (ref 70–99)
Glucose-Capillary: 106 mg/dL — ABNORMAL HIGH (ref 70–99)
Glucose-Capillary: 140 mg/dL — ABNORMAL HIGH (ref 70–99)
Glucose-Capillary: 131 mg/dL — ABNORMAL HIGH (ref 70–99)
Glucose-Capillary: 129 mg/dL — ABNORMAL HIGH (ref 70–99)

## 2022-04-22 LAB — HEMOGLOBIN A1C
Hgb A1c MFr Bld: 6.8 % — ABNORMAL HIGH (ref 4.8–5.6)
Mean Plasma Glucose: 148.46 mg/dL

## 2022-04-22 LAB — MAGNESIUM: Magnesium: 3.3 mg/dL — ABNORMAL HIGH (ref 1.7–2.4)

## 2022-04-22 MED ORDER — ALPRAZOLAM 0.5 MG PO TABS
0.2500 mg | ORAL_TABLET | Freq: Two times a day (BID) | ORAL | Status: DC | PRN
  Administered 2022-04-22 – 2022-04-29 (×11): 0.25 mg via ORAL
  Filled 2022-04-22 (×12): qty 1

## 2022-04-22 MED ORDER — POTASSIUM CHLORIDE 10 MEQ/100ML IV SOLN
10.0000 meq | INTRAVENOUS | Status: AC
  Administered 2022-04-22: 10 meq via INTRAVENOUS
  Filled 2022-04-22: qty 100

## 2022-04-22 MED ORDER — TRAZODONE HCL 50 MG PO TABS
150.0000 mg | ORAL_TABLET | Freq: Every day | ORAL | Status: DC
  Administered 2022-04-22 – 2022-04-28 (×7): 150 mg via ORAL
  Filled 2022-04-22 (×8): qty 3

## 2022-04-22 MED ORDER — POTASSIUM CHLORIDE CRYS ER 20 MEQ PO TBCR
40.0000 meq | EXTENDED_RELEASE_TABLET | Freq: Once | ORAL | Status: AC
  Administered 2022-04-22: 20 meq via ORAL
  Filled 2022-04-22: qty 2

## 2022-04-22 MED ORDER — ATORVASTATIN CALCIUM 80 MG PO TABS
80.0000 mg | ORAL_TABLET | Freq: Every day | ORAL | Status: DC
  Administered 2022-04-22 – 2022-04-29 (×8): 80 mg via ORAL
  Filled 2022-04-22 (×3): qty 1
  Filled 2022-04-22: qty 2
  Filled 2022-04-22 (×4): qty 1

## 2022-04-22 MED ORDER — POTASSIUM CHLORIDE 20 MEQ PO PACK
20.0000 meq | PACK | Freq: Once | ORAL | Status: AC
  Administered 2022-04-22: 20 meq via ORAL
  Filled 2022-04-22: qty 1

## 2022-04-22 MED ORDER — ACETAMINOPHEN 325 MG PO TABS
650.0000 mg | ORAL_TABLET | ORAL | Status: DC | PRN
  Administered 2022-04-22 – 2022-04-29 (×16): 650 mg via ORAL
  Filled 2022-04-22 (×17): qty 2

## 2022-04-22 MED ORDER — POTASSIUM CHLORIDE CRYS ER 20 MEQ PO TBCR
20.0000 meq | EXTENDED_RELEASE_TABLET | ORAL | Status: DC
  Administered 2022-04-22: 20 meq via ORAL
  Filled 2022-04-22: qty 1

## 2022-04-22 MED ORDER — LACTATED RINGERS IV BOLUS
1000.0000 mL | Freq: Once | INTRAVENOUS | Status: AC
  Administered 2022-04-22: 1000 mL via INTRAVENOUS

## 2022-04-22 MED ORDER — EZETIMIBE 10 MG PO TABS
10.0000 mg | ORAL_TABLET | Freq: Every day | ORAL | Status: DC
  Administered 2022-04-22 – 2022-04-29 (×8): 10 mg via ORAL
  Filled 2022-04-22 (×8): qty 1

## 2022-04-22 NOTE — Progress Notes (Signed)
NAME:  Erin Jackson, MRN:  [id number], DOB:  [birth year], LOS: 1 ADMISSION DATE:  04/20/2022, CONSULTATION DATE:  04/21/22 1230a REFERRING MD:  ED , CHIEF COMPLAINT:  Nausea, vomiting, weakness; AMS   History of Present Illness:  56 yo woman with hx of HTN, DM, Anxiety, GERD, here with:   L face numbness Dizzy when walking Nausea and vomiting x 2 weeks.  SOB when vomiting.  Dysuria Headache Abdominal pain  Initial BP 150/114 now 373/428 (>768 systolic intermittently since 1600). Reported to ED that she did not take her BP meds today.   Mental status has reportedly worsened over the course of the day and evening. CT head negative. Labs: cr actually improved from prior (1.1 was 1.62 prior visit).  Tachypnea, tachycardia --> started earlier in afternoon.  UDS pos for opiates and thc (was given morphine prior to drug screen).  CTA chest/abd/pelv obtained to r/o dissection. This was negative but showed hypodensity in upper pole of right kidney concerning for pyelo vs infarct (also seen on CTA 04/08/22), unchanged stenosis of right renal artery, chronically occluded IMA, left internal iliac artery, prox left SFA, unchanged small pericardial effusion. In ED received Benadryl, labetalol x 2, reglan, morphine at 1800, zofran at 1447, Nitroglycerine at 1900.  ceftriaxone, LR 1 L.   Started on cardene gtt at [facility].    Due to HTN, tachycardia, encephalopathy and currently on cardene, PCCM asked to admit to ICU.  Pertinent  Medical History  HTL  HTN Takes plavix GERD DM  Significant Hospital Events: Including procedures, antibiotic start and stop dates in addition to other pertinent events   5/2 admit.  Interim History / Subjective:  No acute events overnight. This morning, patient is sitting up, talking appropriately. Is eager to have something to eat.  Objective   Blood pressure 125/88, pulse 92, temperature 98.5 F (36.9 C), temperature source Oral, resp. rate 19, weight 87.2 kg, SpO2 98  %.        Intake/Output Summary (Last 24 hours) at 04/22/2022 0724 Last data filed at 04/22/2022 0700 Gross per 24 hour  Intake 1529.57 ml  Output 1235 ml  Net 294.57 ml    Filed Weights   04/21/22 0500 04/22/22 0500  Weight: 85.5 kg 87.2 kg    Examination: General: Adult female, resting in bed, in NAD. Neuro: Awake, alert, conversing appropriately. Left hemiparesis.  HEENT: [place]/AT. Sclerae anicteric. Cardiovascular: Regular rate, rhythm. 2/6 systolic murmur appreciated.  Lungs: Respirations even and unlabored.  Clear to ausculation bilaterally. Abdomen: BS x 4, soft, NT/ND.  Musculoskeletal: No gross deformities, no edema.  Skin: Intact, warm, no rashes.  Assessment & Plan:   #Hypertensive encephalopathy #Seizure-like activity BP has markedly improved, systolic in 115-726'O this morning. She is off nicardipine gtt, will hold off initiating oral anti-hypertensives given acute CVA, will follow-up with neuro. Encephalopathy has also resolved, she is awake and fully oriented this morning. No signs of PRES on MRI. No further signs of seizures, EEG negative.  - Holding home antihypertensives, follow-up with stroke team - IV Keppra 1034m twice daily - Daily BMP  #Acute/Subacute L frontal, hippocampus, and R pons infarcts Found on MRI yesterday. Findings concerning for embolic source, pending Echo. Re-started patient's Plavix, stroke team has been consulted and will see. LDL 102, A1c 6.8%.  - Follow-up Echo, MRA head, carotid ultrasound - Plavix 785mdaily - Atorvastatin 8061maily  #Acute on chronic kidney disease #Non-anion gap metabolic acidosis Renal function has worsened over last 24h, sCr 2.17  from 1.46 yesterday afternoon. She has appropriate urine output. Bicarb and anion gap both have improved after patient was able to eat some last night, most likely representing starvation ketosis. Today, she appears hypovolemic from days of poor po intake and n/v. Will have SLP eval today  for diet order, expect improvement as her oral intake improves.  - Follow-up SLP eval, place diet order - Follow-up BMP in AM  #Type II diabetes mellitus Sugars significantly improved this morning and have normalized. Expect will need to adjust insulin regimen as oral intake improves.  - Continue semglee 8 units daily - SSI  #Nausea, vomiting  Has history of THC use, cyclic vomiting syndrome. N/V has improved over last 24h. - Phenergan 12.52m every 6 hours as needed  #Hypomagnesemia Mg has normalized.   #Chronic heart failure with reduced ejection fraction (EF 35-40%) #Non-obstructive coronary artery disease Does not appear hypervolemic, holding home GDMT, can resume once clinically improved. Will need to monitor volume status daily.   #Peripheral vascular disease  During most recent hospitalization found to have severe arterial disease in L, mild disease in R. Per chart review was having claudication symptoms. Vascular consulted at that time, no role for emergent revascularization. Will re-start home meds. - Plavix 750mdaily - Atorvastatin 8035maily   Best Practice (right click and "Reselect all SmartList Selections" daily)   Diet/type: NPO DVT prophylaxis: prophylactic heparin  GI prophylaxis: N/A Lines: N/A Foley:  N/A Code Status:  full code Last date of multidisciplinary goals of care discussion: None yet.  Labs   CBC: Recent Labs  Lab 04/20/22 1207 04/20/22 1249 04/21/22 0343 04/22/22 0144  WBC 10.3  --  17.7* 11.6*  NEUTROABS 6.9  --   --   --   HGB 13.1 14.6 14.1 10.7*  HCT 40.6 43.0 42.9 32.7*  MCV 91.2  --  86.8 88.9  PLT 276  --  354 246     Basic Metabolic Panel: Recent Labs  Lab 04/20/22 1207 04/20/22 1249 04/21/22 0226 04/21/22 1238 04/22/22 0144  NA 138 139 138 139 139  K 3.5 3.7 3.7 3.8 3.0*  CL 101 100 101 104 100  CO2 24  --  17* 15* 28  GLUCOSE 200* 207* 322* 343* 124*  BUN '14 16 20 ' 21* 25*  CREATININE 1.28* 1.10* 1.32* 1.46*  2.17*  CALCIUM 7.5*  --  7.2* 7.5* 7.0*  MG  --   --  <0.5* <0.5* 3.3*  PHOS  --   --  5.1*  --  5.4*    GFR: Estimated Creatinine Clearance: 29 mL/min (A) (by C-G formula based on SCr of 2.17 mg/dL (H)). Recent Labs  Lab 04/20/22 1207 04/20/22 1837 04/21/22 0048 04/21/22 0343 04/22/22 0144  WBC 10.3  --   --  17.7* 11.6*  LATICACIDVEN  --  2.0* 1.7 2.0*  --      Liver Function Tests: Recent Labs  Lab 04/20/22 1207  AST 19  ALT 13  ALKPHOS 65  BILITOT 0.4  PROT 7.7  ALBUMIN 3.1*    Recent Labs  Lab 04/20/22 1207  LIPASE 38    No results for input(s): AMMONIA in the last 168 hours.  ABG    Component Value Date/Time   HCO3 30.6 (H) 04/21/2022 1238   TCO2 25 04/20/2022 1249   O2SAT 55 04/21/2022 1238      Coagulation Profile: No results for input(s): INR, PROTIME in the last 168 hours.  Cardiac Enzymes: Recent Labs  Lab 04/21/22  0343  CKTOTAL 258*    HbA1C: Hgb A1c MFr Bld  Date/Time Value Ref Range Status  04/09/2022 07:40 AM 6.5 (H) 4.8 - 5.6 % Final    Comment:    (NOTE) Pre diabetes:          5.7%-6.4%  Diabetes:              >6.4%  Glycemic control for   <7.0% adults with diabetes   06/18/2020 11:39 AM 8.0 (H) 4.8 - 5.6 % Final    Comment:    (NOTE)         Prediabetes: 5.7 - 6.4         Diabetes: >6.4         Glycemic control for adults with diabetes: <7.0     CBG: Recent Labs  Lab 04/21/22 0746 04/21/22 1127 04/21/22 1927 04/21/22 2319 04/22/22 0335  GLUCAP 242* 207* 247* 136* 117*     Review of Systems:   Unable to obtain as pt is encephalopathic.  Past Medical History:  She,  has a past medical history of Anxiety, Chronic back pain, Diabetes mellitus without complication ([place]), GERD (gastroesophageal reflux disease), and Hypertension.   Surgical History:   Past Surgical History:  Procedure Laterality Date   ABDOMINAL HYSTERECTOMY     BREAST LUMPECTOMY Left 03/25/2022   Procedure: LEFT BREAST LUMPECTOMY;   Surgeon: Jovita Kussmaul, MD;  Location: [facility];  Service: General;  Laterality: Left;   BREAST REDUCTION SURGERY Bilateral 08/15/2018   Procedure: BILATERAL MAMMARY REDUCTION  (BREAST);  Surgeon: Cristine Polio, MD;  Location: [facility];  Service: Plastics;  Laterality: Bilateral;   BREAST REDUCTION SURGERY Left 09/25/2019   Procedure: EXCISION OF FAT NECROSIS OF LEFT BREAST;  Surgeon: Cristine Polio, MD;  Location: [facility];  Service: Plastics;  Laterality: Left;   CARPAL TUNNEL RELEASE Right 06/17/2020   right thumb CMC arthroplasty with tendon transfer and right carpal tunnel release.    CERVICAL SPINE SURGERY  2015   has a metal plate   FOOT SURGERY Left 2014   HAND SURGERY     LEFT HEART CATH AND CORONARY ANGIOGRAPHY N/A 04/09/2022   Procedure: LEFT HEART CATH AND CORONARY ANGIOGRAPHY;  Surgeon: Dixie Dials, MD;  Location: [facility];  Service: Cardiovascular;  Laterality: N/A;   REDUCTION MAMMAPLASTY Bilateral 07/2018     Social History:   reports that she has been smoking cigarettes. She has been smoking an average of .5 packs per day. She has never used smokeless tobacco. She reports current alcohol use. She reports that she does not use drugs.   Family History:  Her family history includes Breast cancer in her maternal aunt and maternal grandmother.   Allergies Allergies  Allergen Reactions   Citrus Shortness Of Breath   Cucumber Extract Anaphylaxis   Molds & Smuts Shortness Of Breath   Norvasc [Amlodipine] Other (See Comments)    Muscle became very tight   Apple Juice    Other Other (See Comments)    Tree nuts, walnuts   Aspirin Rash   Ibuprofen Rash   Niacin And Related Rash     Home Medications  Prior to Admission medications   Medication Sig Start Date End Date Taking? Authorizing Provider  acetaminophen (TYLENOL) 500 MG tablet Take 500 mg by mouth every 6 (six) hours as needed for headache.     [provider]  albuterol (VENTOLIN HFA) 108 (90 Base) MCG/ACT inhaler Inhale 2  puffs into the lungs every 6 (six) hours as needed for wheezing or shortness of breath.    [provider]  alprazolam Duanne Moron) 2 MG tablet Take 0.5 tablets (1 mg total) by mouth 2 (two) times daily as needed for anxiety. 04/10/22   Elgergawy, Silver Huguenin, MD  atorvastatin (LIPITOR) 80 MG tablet Take 1 tablet (80 mg total) by mouth daily. 04/11/22   Elgergawy, Silver Huguenin, MD  cetirizine (ZYRTEC ALLERGY) 10 MG tablet Take 1 tablet (10 mg total) by mouth daily. 04/14/22   Valarie Merino, MD  cloNIDine (CATAPRES) 0.2 MG tablet Take 1 tablet (0.2 mg total) by mouth 2 (two) times daily. 04/10/22   Elgergawy, Silver Huguenin, MD  clopidogrel (PLAVIX) 75 MG tablet Take 1 tablet (75 mg total) by mouth daily. 04/10/22   Elgergawy, Silver Huguenin, MD  EPINEPHRINE 0.3 mg/0.3 mL IJ SOAJ injection Inject 0.3 mLs (0.3 mg total) into the muscle once as needed (allergic reaction). Patient taking differently: Inject 0.3 mg into the muscle as needed for anaphylaxis (allergic reaction). 10/04/18   Fabian November, MD  HYDROcodone-acetaminophen (NORCO) 10-325 MG tablet Take 1 tablet by mouth in the morning and at bedtime. 03/20/22   [provider]  losartan (COZAAR) 25 MG tablet Take 1 tablet (25 mg total) by mouth daily. 04/11/22   Elgergawy, Silver Huguenin, MD  metFORMIN (GLUCOPHAGE) 500 MG tablet Take 1 tablet (500 mg total) by mouth 2 (two) times daily with a meal. 04/12/22   Elgergawy, Silver Huguenin, MD  metoprolol tartrate (LOPRESSOR) 50 MG tablet Take 1 tablet (50 mg total) by mouth 2 (two) times daily. 04/10/22   Elgergawy, Silver Huguenin, MD  NARCAN 4 MG/0.1ML LIQD nasal spray kit Place 0.4 mg into the nose as needed (opoid overdose). 01/08/20   [provider]  nicotine (NICODERM CQ - DOSED IN MG/24 HOURS) 14 mg/24hr patch Place 1 patch (14 mg total) onto the skin daily. 04/11/22   Elgergawy, Silver Huguenin, MD  nitroGLYCERIN (NITROSTAT) 0.4 MG SL  tablet Place 1 tablet (0.4 mg total) under the tongue every 5 (five) minutes as needed for chest pain. 04/10/22   Elgergawy, Silver Huguenin, MD  omeprazole (PRILOSEC) 40 MG capsule Take 40 mg by mouth in the morning and at bedtime.    [provider]  pantoprazole (PROTONIX) 40 MG tablet Take 1 tablet (40 mg total) by mouth daily. 04/11/22   Elgergawy, Silver Huguenin, MD  predniSONE (STERAPRED UNI-PAK 21 TAB) 10 MG (21) TBPK tablet Use per package instructions Patient taking differently: Take 10-60 mg by mouth as directed. Take 6 tablets on Day 1 Take 5 tablets on Day 2 Take 4 tablets on Day 3 Take 3 tablets on Day 4 Take 2 tablets on Day 5 Take 1 tablets on Day 6  Use per package instructions 04/10/22   Elgergawy, Silver Huguenin, MD  traZODone (DESYREL) 150 MG tablet Take 150 mg by mouth at bedtime.    [provider]  XTAMPZA ER 13.5 MG C12A Take 1 capsule by mouth 2 (two) times daily as needed (pain). 03/20/22   [provider]     Critical care time: 35 min.   Sanjuan Dame, MD Internal Medicine PGY-2 Pager: [phone]

## 2022-04-22 NOTE — Progress Notes (Signed)
eLink Physician-Brief Progress Note ?Patient Name: Erin Jackson ?DOB: 02-21-1966 ?MRN: [id number] ? ? ?Date of Service ? 04/22/2022  ?HPI/Events of Note ? K+ 3.2, Cr 2.17.  ?eICU Interventions ? K+ repleted per E-Link electrolyte replacement protocol.  ? ? ? ?  ? ?Thomasene Lot Genevia Bouldin ?04/22/2022, 5:49 AM ?

## 2022-04-22 NOTE — Progress Notes (Deleted)
Erin Jackson was seen and treated in [facility] on 04/20/2022 and is still a patient there. Her son, Harlin Heys, may be excused from work for 5/3-5/4. ?  ?Dr. Judithann Graves is the attending on duty.  ? ?Thank you! ? ? ? ? ? ?

## 2022-04-22 NOTE — Evaluation (Signed)
Physical Therapy Evaluation Patient Details Name: Erin RichardSharon S Mcquillen MRN: [id number] DOB: 1966/07/17 Today's Date: 04/22/2022  History of Present Illness  Pt is a 56 y/o F [facility] with n/v and SOB. Found to have encephalopathy and seizure-like activity in the context of severe HTN. MRI shows small acute infarcts of left frontal white matter, hippocampus, and R pons. Pt in the ICU to monitor her tachycardia, HTN, and seizure activity.  CT head negative. PMH of HTN, DM, Anxiety, GERD, and recent heart cath on 4/20.  Clinical Impression  Pt presents with decreased functional mobility, strength, balance, dizziness, and double vision secondary to diagnosis above. These impairments are limiting her ability to safely and independently transfer, get into her home, perform all adls/iadls, and ambulate in the community. Pt poor historian but PTA she was relatively independent and would get help to get in/out of the shower, but was ambulating w/o an AD. Pt to benefit from acute PT to address deficits. Bed mobility, STS transfer, and step pivot transfer to chair performed. Pt required mod A +2 overall for all mobility and transfers. Pt. Responded well but was limited secondary to dizziness and functional strength. She shows good potential for strength and balance improvement for increased independence and safety at home with AIR. SPT recommends AIR follow up for further mobility, balance, gait, and functional endurance training once medically stable for d/c. PT to progress mobility as tolerated, and will continue to follow acutely.         Recommendations for follow up therapy are one component of a multi-disciplinary discharge planning process, led by the attending physician.  Recommendations may be updated based on patient status, additional functional criteria and insurance authorization.  Follow Up Recommendations Acute inpatient rehab (3hours/day)    Assistance Recommended at Discharge Frequent  or constant Supervision/Assistance  Patient can return home with the following  A lot of help with walking and/or transfers;A lot of help with bathing/dressing/bathroom;Assistance with cooking/housework;Direct supervision/assist for medications management;Direct supervision/assist for financial management;Assist for transportation;Help with stairs or ramp for entrance    Equipment Recommendations Rolling walker (2 wheels);BSC/3in1     Functional Status Assessment Patient has had a recent decline in their functional status and demonstrates the ability to make significant improvements in function in a reasonable and predictable amount of time.     Precautions / Restrictions Precautions Precautions: Fall Precaution Comments: Watch BP, dizzy, diplopia Restrictions Weight Bearing Restrictions: No      Mobility  Bed Mobility Overal bed mobility: Needs Assistance Bed Mobility: Supine to Sit     Supine to sit: Mod assist, +2 for physical assistance     General bed mobility comments: Pt initiates LE and trunk movement but needs help to come to full sit, for LE management and trunk support. Patient Response: Anxious  Transfers Overall transfer level: Needs assistance Equipment used: Rolling walker (2 wheels) Transfers: Sit to/from Stand, Bed to chair/wheelchair/BSC Sit to Stand: Mod assist   Step pivot transfers: +2 physical assistance       General transfer comment: Mod A for power up and steady, Mod A for stepping cues, walker management and trunk/hip support during step pivot. Pt reports being dizzy and cued to close one eye.      Modified Rankin (Stroke Patients Only) Modified Rankin (Stroke Patients Only) Pre-Morbid Rankin Score: Slight disability Modified Rankin: Moderately severe disability     Balance Overall balance assessment: Needs assistance Sitting-balance support: No upper extremity supported, Feet unsupported Sitting balance-Leahy Scale:  Fair Sitting  balance - Comments: Pt able to tolerate unsupported sitting for a short time, fatigues and starts to lean posteriorly Postural control: Posterior lean Standing balance support: Bilateral upper extremity supported, During functional activity, Reliant on assistive device for balance Standing balance-Leahy Scale: Poor Standing balance comment: Requires RW, cuing, and additional trunk support to stand and take steps.                             Pertinent Vitals/Pain Pain Assessment Pain Assessment: Faces Faces Pain Scale: Hurts a little bit Pain Location: Head Pain Descriptors / Indicators: Headache Pain Intervention(s): Limited activity within patient's tolerance, Monitored during session    Home Living Family/patient expects to be discharged to:: Private residence Living Arrangements: Children (51 yo son) Available Help at Discharge: Family;Available PRN/intermittently Type of Home: Apartment Home Access: Stairs to enter Entrance Stairs-Rails: None Entrance Stairs-Number of Steps: 3   Home Layout: One level Home Equipment: None      Prior Function Prior Level of Function : Needs assist             Mobility Comments: independent however reports she could use an AD ADLs Comments: sister or niece assists with bathing, family helps with cooking and cleaning, no longer drives.     Hand Dominance        Extremity/Trunk Assessment   Upper Extremity Assessment Upper Extremity Assessment: Defer to OT evaluation    Lower Extremity Assessment Lower Extremity Assessment: RLE deficits/detail;LLE deficits/detail RLE Deficits / Details: limited antigravity control in open chain, no formal MMT, but pt able to stand LLE Deficits / Details: Pt reports toe and plantar foot decreased sensation, limited antigravity control in open chain, no formal MMT, but pt able to stand    Cervical / Trunk Assessment Cervical / Trunk Assessment: Normal  Communication   Communication:  Expressive difficulties (decreased phonation)  Cognition   Behavior During Therapy: Flat affect, Anxious                                   General Comments: Pt. poor hisotrian, unsure of actual assit level needed. Seems to get anxious with movement.        General Comments General comments (skin integrity, edema, etc.): 2l O2 via [place] SPo2>95% during session. BP 139/88 supine, 144/84 sitting, 139/94 after transfer. Pt presents with tremor-like activity during movement.        Assessment/Plan    PT Assessment Patient needs continued PT services  PT Problem List Decreased strength;Decreased mobility;Decreased safety awareness;Decreased range of motion;Decreased coordination;Decreased activity tolerance;Decreased cognition;Decreased balance;Decreased knowledge of use of DME;Impaired sensation       PT Treatment Interventions DME instruction;Therapeutic activities;Cognitive remediation;Gait training;Therapeutic exercise;Patient/family education;Stair training;Balance training;Functional mobility training;Neuromuscular re-education    PT Goals (Current goals can be found in the Care Plan section)  Acute Rehab PT Goals Patient Stated Goal: To get strength back to walk PT Goal Formulation: With patient Time For Goal Achievement: 05/06/22 Potential to Achieve Goals: Fair    Frequency Min 3X/week        AM-PAC PT "6 Clicks" Mobility  Outcome Measure Help needed turning from your back to your side while in a flat bed without using bedrails?: A Lot Help needed moving from lying on your back to sitting on the side of a flat bed without using bedrails?: A Lot Help needed moving to  and from a bed to a chair (including a wheelchair)?: A Lot Help needed standing up from a chair using your arms (e.g., wheelchair or bedside chair)?: A Lot Help needed to walk in hospital room?: Total Help needed climbing 3-5 steps with a railing? : Total 6 Click Score: 10    End of Session  Equipment Utilized During Treatment: Gait belt;Oxygen Activity Tolerance: Other (comment) (limited by dizziness) Patient left: in chair;with call bell/phone within reach;with chair alarm set Nurse Communication: Mobility status PT Visit Diagnosis: Unsteadiness on feet (R26.81);Dizziness and giddiness (R42);Muscle weakness (generalized) (M62.81)    Time: 8938-1017 PT Time Calculation (min) (ACUTE ONLY): 32 min   Charges:   PT Evaluation $PT Eval Moderate Complexity: 1 Mod PT Treatments $Therapeutic Activity: 8-22 mins        Lorie Apley, SPT Acute Rehab Services   Lorie Apley 04/22/2022, 9:33 AM

## 2022-04-22 NOTE — Progress Notes (Signed)
eLink Physician-Brief Progress Note ?Patient Name: Erin Jackson ?DOB: Jul 21, 1966 ?MRN: [id number] ? ? ?Date of Service ? 04/22/2022  ?HPI/Events of Note ? Patient complaining of chest pain that appeared reproducible by palpation over the chest wall and made worse by breathing in, EKG unremarkable.  ?eICU Interventions ? One sl NTG given, PRN Tylenol ordered.  ? ? ? ?  ? ?Thomasene Lot Angelia Hazell ?04/22/2022, 12:38 AM ?

## 2022-04-22 NOTE — Progress Notes (Signed)
Inpatient Rehab Admissions Coordinator:   Per therapy recommendations,  patient was screened for CIR candidacy by Welby Montminy, MS, CCC-SLP . At this time, Pt. Appears to be a a potential candidate for CIR. I will request   order for rehab consult per protocol for full assessment. Please contact me any with questions.  Talah Cookston, MS, CCC-SLP Rehab Admissions Coordinator  [phone] (celll) [phone] (office)  

## 2022-04-22 NOTE — Progress Notes (Signed)
?  [facility] ?[address] ?[place], [place]  [address] ?Phone:  [phone] ? ?Apr 22, 2022 ?  ?To Whom It May Concern: ? ?Erin Jackson was seen and treated in [facility] on 04/20/2022 and is still a patient there. Her son, [name], may be excused from work for 5/3-5/4. ?  ?Dr. Walker Shadow is the attending on duty.  ? ?Thank you! ?

## 2022-04-22 NOTE — Progress Notes (Signed)
STROKE TEAM PROGRESS NOTE   SUBJECTIVE (INTERVAL HISTORY) Her RN is at the bedside.  Overall her condition is gradually improving.  Patient was drowsy after dose of Phenergan for nausea and incoming MRA.  Patient admitted for several days of nausea vomiting, chest pain, shortness of breath, headache, dizziness.  BP was superhigh on admission, was on Cardene drip but this morning BP soft at 90s after Phenergan dose.  Patient symptoms does not consistent with seizure activity, EEG negative, most likely due to stroke symptoms.  Will DC Keppra.   OBJECTIVE Temp:  [98.5 F (36.9 C)-99.2 F (37.3 C)] 98.6 F (37 C) (05/03 1538) Pulse Rate:  [80-124] 88 (05/03 1400) Cardiac Rhythm: Normal sinus rhythm;Sinus tachycardia (05/03 0800) Resp:  [17-29] 25 (05/03 1400) BP: (94-144)/(67-114) 94/73 (05/03 1400) SpO2:  [92 %-100 %] 92 % (05/03 1400) Weight:  [87.2 kg] 87.2 kg (05/03 0500)  Recent Labs  Lab 04/21/22 2319 04/22/22 0335 04/22/22 0740 04/22/22 1126 04/22/22 1537  GLUCAP 136* 117* 130* 106* 140*   Recent Labs  Lab 04/20/22 1207 04/20/22 1249 04/21/22 0226 04/21/22 1238 04/22/22 0144  NA 138 139 138 139 139  K 3.5 3.7 3.7 3.8 3.0*  CL 101 100 101 104 100  CO2 24  --  17* 15* 28  GLUCOSE 200* 207* 322* 343* 124*  BUN 14 16 20  21* 25*  CREATININE 1.28* 1.10* 1.32* 1.46* 2.17*  CALCIUM 7.5*  --  7.2* 7.5* 7.0*  MG  --   --  <0.5* <0.5* 3.3*  PHOS  --   --  5.1*  --  5.4*   Recent Labs  Lab 04/20/22 1207  AST 19  ALT 13  ALKPHOS 65  BILITOT 0.4  PROT 7.7  ALBUMIN 3.1*   Recent Labs  Lab 04/20/22 1207 04/20/22 1249 04/21/22 0343 04/22/22 0144  WBC 10.3  --  17.7* 11.6*  NEUTROABS 6.9  --   --   --   HGB 13.1 14.6 14.1 10.7*  HCT 40.6 43.0 42.9 32.7*  MCV 91.2  --  86.8 88.9  PLT 276  --  354 246   Recent Labs  Lab 04/21/22 0343  CKTOTAL 258*   No results for input(s): LABPROT, INR in the last 72 hours. Recent Labs    04/21/22 0233  COLORURINE YELLOW   LABSPEC 1.044*  PHURINE 5.0  GLUCOSEU 150*  HGBUR MODERATE*  BILIRUBINUR NEGATIVE  KETONESUR 20*  PROTEINUR >=300*  NITRITE NEGATIVE  LEUKOCYTESUR NEGATIVE       Component Value Date/Time   CHOL 170 04/22/2022 0144   TRIG 225 (H) 04/22/2022 0144   HDL 23 (L) 04/22/2022 0144   CHOLHDL 7.4 04/22/2022 0144   VLDL 45 (H) 04/22/2022 0144   LDLCALC 102 (H) 04/22/2022 0144   Lab Results  Component Value Date   HGBA1C 6.8 (H) 04/22/2022      Component Value Date/Time   LABOPIA POSITIVE (A) 04/20/2022 2034   COCAINSCRNUR NONE DETECTED 04/20/2022 2034   LABBENZ NONE DETECTED 04/20/2022 2034   AMPHETMU NONE DETECTED 04/20/2022 2034   THCU POSITIVE (A) 04/20/2022 2034   LABBARB NONE DETECTED 04/20/2022 2034    No results for input(s): ETH in the last 168 hours.  I have personally reviewed the radiological images below and agree with the radiology interpretations.  DG Chest 1 View  Result Date: 04/14/2022 CLINICAL DATA:  Chest pain and itching. EXAM: CHEST  1 VIEW COMPARISON:  10/04/2018. FINDINGS: The heart is enlarged the mediastinal contour is within  normal limits. Subsegmental atelectasis or scarring is noted in the mid left lung. No consolidation, effusion, or pneumothorax. Cervical spinal fusion hardware is noted. Degenerative changes are present in the thoracic spine. IMPRESSION: 1. Mild subsegmental atelectasis or scarring in mid left lung. 2. Cardiomegaly. Electronically Signed   By: Brett Fairy M.D.   On: 04/14/2022 20:06   CT HEAD WO CONTRAST (5MM)  Result Date: 04/20/2022 CLINICAL DATA:  Nonspecific dizziness EXAM: CT HEAD WITHOUT CONTRAST TECHNIQUE: Contiguous axial images were obtained from the base of the skull through the vertex without intravenous contrast. RADIATION DOSE REDUCTION: This exam was performed according to the departmental dose-optimization program which includes automated exposure control, adjustment of the mA and/or kV according to patient size and/or  use of iterative reconstruction technique. COMPARISON:  01/20/2017 FINDINGS: Brain: No acute infarct or hemorrhage. Lateral ventricles and midline structures are unremarkable. No acute extra-axial fluid collections. No mass effect. Vascular: No hyperdense vessel or unexpected calcification. Skull: Normal. Negative for fracture or focal lesion. Sinuses/Orbits: No acute finding. Other: None. IMPRESSION: 1. No acute intracranial process.  Stable exam. Electronically Signed   By: Randa Ngo M.D.   On: 04/20/2022 15:18   MR ANGIO HEAD WO CONTRAST  Result Date: 04/22/2022 CLINICAL DATA:  Provided history: Stroke, follow-up. EXAM: MRA HEAD WITHOUT CONTRAST TECHNIQUE: Angiographic images of the Circle of Willis were acquired using MRA technique without intravenous contrast. COMPARISON:  Brain MRI 04/21/2022. FINDINGS: Anterior circulation: The intracranial internal carotid arteries are patent. Atherosclerotic irregularity of both vessels with no more than mild stenosis. The M1 middle cerebral arteries are patent. No M2 proximal branch occlusion is identified. Atherosclerotic irregularity of the M2 and more distal MCA vessels, bilaterally. Most notably, there is a moderate stenosis within a superior division mid-to-distal M2 left MCA vessel. The anterior cerebral arteries are patent. No intracranial aneurysm is identified. Posterior circulation: The intracranial vertebral arteries are patent. The right vertebral artery is dominant. The basilar artery is patent. The posterior cerebral arteries are patent. Mild atherosclerotic irregularity of both vessels. Sizable posterior communicating arteries with developmentally hypoplastic P1 segments, bilaterally. Anatomic variants: As described. IMPRESSION: No intracranial large vessel occlusion is identified. Intracranial atherosclerotic disease, as described. Most notably, there is a moderate focal stenosis within a superior division mid-to-distal M2 left MCA vessel.  Electronically Signed   By: Kellie Simmering D.O.   On: 04/22/2022 15:43   MR BRAIN WO CONTRAST  Result Date: 04/21/2022 CLINICAL DATA:  Mental status change, unknown cause EXAM: MRI HEAD WITHOUT CONTRAST TECHNIQUE: Multiplanar, multiecho pulse sequences of the brain and surrounding structures were obtained without intravenous contrast. COMPARISON:  CT head Apr 20, 2022.  MRI head November 12, 2020. FINDINGS: Brain: Small acute or subacute infarcts in the left frontal white matter and left hippocampus. Mild edema without mass effect. No evidence of acute hemorrhage, mass lesion, midline shift, hydrocephalus, or extra-axial fluid collection. Additional mild T2/FLAIR hyperintensities within the white matter, nonspecific but compatible with chronic microvascular ischemic disease. Partially empty sella. Vascular: Major arterial flow voids are maintained at the skull base. Skull and upper cervical spine: Normal marrow signal. Sinuses/Orbits: Clear sinuses.  No acute orbital findings. Other: No mastoid effusions. IMPRESSION: 1. Small acute or subacute infarcts in the left frontal white matter, left hippocampus, and right pons. Mild edema without mass effect. Given involvement of multiple vascular territories, consider embolic etiology. 2. Mild chronic microvascular ischemic disease. 3. Partially empty sella, which is often a normal anatomic variant but can be associated with  idiopathic intracranial hypertension. Electronically Signed   By: Margaretha Sheffield M.D.   On: 04/21/2022 16:34   CARDIAC CATHETERIZATION  Result Date: 04/09/2022   Ost LAD to Prox LAD lesion is 50% stenosed.   Mid Cx lesion is 40% stenosed.   Dist Cx lesion is 50% stenosed. Life style modification for mild to moderate multivessel disease.   DG Chest Port 1 View  Result Date: 04/22/2022 CLINICAL DATA:  Respiratory failure. EXAM: PORTABLE CHEST 1 VIEW COMPARISON:  04/20/2022 and CT chest 04/20/2022. FINDINGS: Trachea is midline. Heart size stable.  Increasing bibasilar streaky and patchy airspace opacification. There may be a small left pleural effusion. IMPRESSION: 1. Increasing bibasilar atelectasis. Difficult to exclude developing pneumonia in the right lung base. 2. Possible small left pleural effusion. Electronically Signed   By: Lorin Picket M.D.   On: 04/22/2022 08:34   DG Chest Port 1 View  Result Date: 04/20/2022 CLINICAL DATA:  Pt poor historian. Per PA: Pt complains of shortness of breath. Participates minimally in history, vomiting (clear) in triage, upper abdominal discomfort. EXAM: PORTABLE CHEST - 1 VIEW COMPARISON:  04/14/2022 FINDINGS: Perihilar and bibasilar interstitial edema or infiltrates, worse on right than left, new since previous. Heart size and mediastinal contours are within normal limits. No effusion. Cervical fixation hardware noted. IMPRESSION: New asymmetric perihilar and bibasilar infiltrates or edema, right worse than left. Electronically Signed   By: Lucrezia Europe M.D.   On: 04/20/2022 14:09   EEG adult  Result Date: 04/21/2022 Lora Havens, MD     04/21/2022 12:07 PM Patient Name: MYCHAEL BIERNAT MRN: [id number] Epilepsy Attending: Lora Havens Referring Physician/Provider: Kerney Elbe, MD Date: 04/21/2022 Duration: 22.10 mins Patient history: 56 year old female presenting with encephalopathy and seizure-like activity in the context of severe HTN.  EEG to evaluate for seizure. Level of alertness: Awake,asleep AEDs during EEG study: LEV Technical aspects: This EEG study was done with scalp electrodes positioned according to the 10-20 International system of electrode placement. Electrical activity was acquired at a sampling rate of 500Hz  and reviewed with a high frequency filter of 70Hz  and a low frequency filter of 1Hz . EEG data were recorded continuously and digitally stored. Description: The posterior dominant rhythm consists of 8-9 Hz activity of moderate voltage (25-35 uV) seen predominantly in posterior head  regions, symmetric and reactive to eye opening and eye closing. Sleep was characterized by vertex waves, sleep spindles (12 to 14 Hz), maximal frontocentral region.  Hyperventilation and photic stimulation were not performed.   IMPRESSION: This study is within normal limits. No seizures or epileptiform discharges were seen throughout the recording. Priyanka O Yadav   VAS [place] ABI WITH/WO TBI  Result Date: 04/09/2022  LOWER EXTREMITY DOPPLER STUDY Patient Name:  NATIRA LUNGREN  Date of Exam:   04/08/2022 Medical Rec #: [id number]       Accession #:    [id number] Date of Birth: 06/01/1966       Patient Gender: F Patient Age:   1 years Exam Location:  [facility] Procedure:      VAS [place] ABI WITH/WO TBI Referring Phys: Aldona Bar RHYNE --------------------------------------------------------------------------------  Indications: Femoral artery occlusion, left. High Risk Factors: Hypertension, Diabetes.  Comparison Study: no prior Performing Technologist: Archie Patten RVS  Examination Guidelines: A complete evaluation includes at minimum, Doppler waveform signals and systolic blood pressure reading at the level of bilateral brachial, anterior tibial, and posterior tibial arteries, when vessel segments are accessible. Bilateral testing is considered an  integral part of a complete examination. Photoelectric Plethysmograph (PPG) waveforms and toe systolic pressure readings are included as required and additional duplex testing as needed. Limited examinations for reoccurring indications may be performed as noted.  ABI Findings: +---------+------------------+-----+---------+--------+ Right    Rt Pressure (mmHg)IndexWaveform Comment  +---------+------------------+-----+---------+--------+ Brachial 155                    triphasic         +---------+------------------+-----+---------+--------+ PTA      121               0.74 biphasic          +---------+------------------+-----+---------+--------+ DP        151               0.93 triphasic         +---------+------------------+-----+---------+--------+ Theodoro Parma               0.69 Abnormal          +---------+------------------+-----+---------+--------+ +---------+------------------+-----+-------------------+-----------------------+ Left     Lt Pressure (mmHg)IndexWaveform           Comment                 +---------+------------------+-----+-------------------+-----------------------+ Brachial 163                    triphasic                                  +---------+------------------+-----+-------------------+-----------------------+ PTA      66                0.40 dampened monophasic                        +---------+------------------+-----+-------------------+-----------------------+ DP       64                0.39 dampened monophasic                        +---------+------------------+-----+-------------------+-----------------------+ Great Toe                                          unable to obtain                                                           pressure due to                                                            decreased great toe                                                        amplitude               +---------+------------------+-----+-------------------+-----------------------+ +-------+-----------+-----------+------------+------------+  ABI/TBIToday's ABIToday's TBIPrevious ABIPrevious TBI +-------+-----------+-----------+------------+------------+ Right  0.93       0.69                                +-------+-----------+-----------+------------+------------+ Left   0.40                                           +-------+-----------+-----------+------------+------------+   Summary: Right: Resting right ankle-brachial index indicates mild right lower extremity arterial disease. The right toe-brachial index is abnormal. Left: Resting left  ankle-brachial index indicates severe left lower extremity arterial disease. *See table(s) above for measurements and observations.  Electronically signed by Jamelle Haring on 04/09/2022 at 7:26:35 AM.    Final    ECHOCARDIOGRAM COMPLETE  Result Date: 04/08/2022    ECHOCARDIOGRAM REPORT   Patient Name:   FANCHON DUDECK Date of Exam: 04/08/2022 Medical Rec #:  [id number]      Height:       62.5 in Accession #:    [id number]     Weight:       207.9 lb Date of Birth:  October 08, 1966      BSA:          1.955 m Patient Age:    3 years       BP:           198/108 mmHg Patient Gender: F              HR:           96 bpm. Exam Location:  Inpatient Procedure: 2D Echo, Cardiac Doppler and Color Doppler Indications:    R07.9* Chest pain, unspecified  History:        Patient has no prior history of Echocardiogram examinations.                 Risk Factors:Hypertension and Diabetes.  Sonographer:    Bernadene Person RDCS Referring Phys: Mitchell  1. Inferior Septal / apical hypokinesis . Left ventricular ejection fraction, by estimation, is 35 to 40%. The left ventricle has moderately decreased function. The left ventricle demonstrates regional wall motion abnormalities (see scoring diagram/findings for description). The left ventricular internal cavity size was mildly dilated. Left ventricular diastolic parameters were normal.  2. Right ventricular systolic function is normal. The right ventricular size is normal.  3. A small pericardial effusion is present. The pericardial effusion is posterior to the left ventricle and anterior to the right ventricle.  4. The mitral valve is abnormal. Trivial mitral valve regurgitation. No evidence of mitral stenosis.  5. The aortic valve was not well visualized. There is mild calcification of the aortic valve. There is mild thickening of the aortic valve. Aortic valve regurgitation is trivial. Aortic valve sclerosis is present, with no evidence of aortic valve  stenosis.  6. The inferior vena cava is normal in size with greater than 50% respiratory variability, suggesting right atrial pressure of 3 mmHg. FINDINGS  Left Ventricle: Inferior Septal / apical hypokinesis. Left ventricular ejection fraction, by estimation, is 35 to 40%. The left ventricle has moderately decreased function. The left ventricle demonstrates regional wall motion abnormalities. The left ventricular internal cavity size was mildly dilated. There is no left ventricular hypertrophy. Left ventricular diastolic parameters were normal. Right Ventricle: The right ventricular size is normal.  No increase in right ventricular wall thickness. Right ventricular systolic function is normal. Left Atrium: Left atrial size was normal in size. Right Atrium: Right atrial size was normal in size. Pericardium: A small pericardial effusion is present. The pericardial effusion is posterior to the left ventricle and anterior to the right ventricle. Mitral Valve: The mitral valve is abnormal. There is mild thickening of the mitral valve leaflet(s). There is mild calcification of the mitral valve leaflet(s). Mild mitral annular calcification. Trivial mitral valve regurgitation. No evidence of mitral valve stenosis. Tricuspid Valve: The tricuspid valve is normal in structure. Tricuspid valve regurgitation is trivial. No evidence of tricuspid stenosis. Aortic Valve: The aortic valve was not well visualized. There is mild calcification of the aortic valve. There is mild thickening of the aortic valve. Aortic valve regurgitation is trivial. Aortic regurgitation PHT measures 481 msec. Aortic valve sclerosis is present, with no evidence of aortic valve stenosis. Pulmonic Valve: The pulmonic valve was normal in structure. Pulmonic valve regurgitation is not visualized. No evidence of pulmonic stenosis. Aorta: The aortic root is normal in size and structure. Venous: The inferior vena cava is normal in size with greater than 50%  respiratory variability, suggesting right atrial pressure of 3 mmHg. IAS/Shunts: No atrial level shunt detected by color flow Doppler.  LEFT VENTRICLE PLAX 2D LVIDd:         4.70 cm      Diastology LVIDs:         3.90 cm      LV e' medial:    5.93 cm/s LV PW:         1.10 cm      LV E/e' medial:  12.5 LV IVS:        1.00 cm      LV e' lateral:   6.68 cm/s LVOT diam:     2.00 cm      LV E/e' lateral: 11.1 LV SV:         60 LV SV Index:   31 LVOT Area:     3.14 cm  LV Volumes (MOD) LV vol d, MOD A2C: 126.0 ml LV vol d, MOD A4C: 93.4 ml LV vol s, MOD A2C: 75.2 ml LV vol s, MOD A4C: 48.9 ml LV SV MOD A2C:     50.8 ml LV SV MOD A4C:     93.4 ml LV SV MOD BP:      46.4 ml RIGHT VENTRICLE RV S prime:     11.90 cm/s TAPSE (M-mode): 1.7 cm LEFT ATRIUM             Index        RIGHT ATRIUM          Index LA diam:        2.70 cm 1.38 cm/m   RA Area:     9.28 cm LA Vol (A2C):   38.6 ml 19.74 ml/m  RA Volume:   17.80 ml 9.10 ml/m LA Vol (A4C):   31.5 ml 16.11 ml/m LA Biplane Vol: 36.0 ml 18.41 ml/m  AORTIC VALVE LVOT Vmax:         120.00 cm/s LVOT Vmean:        79.900 cm/s LVOT VTI:          0.190 m AI PHT:            481 msec AR Vena Contracta: 0.20 cm  AORTA Ao Root diam: 3.20 cm Ao Asc diam:  3.60 cm MITRAL VALVE MV Area (  PHT): 5.13 cm    SHUNTS MV Decel Time: 148 msec    Systemic VTI:  0.19 m MV E velocity: 74.10 cm/s  Systemic Diam: 2.00 cm MV A velocity: 61.30 cm/s MV E/A ratio:  1.21 Jenkins Rouge MD Electronically signed by Jenkins Rouge MD Signature Date/Time: 04/08/2022/1:51:06 PM    Final    ECHOCARDIOGRAM LIMITED BUBBLE STUDY  Result Date: 04/22/2022    ECHOCARDIOGRAM REPORT   Patient Name:   BRITTIANY MESSMAN Date of Exam: 04/22/2022 Medical Rec #:  [id number]      Height:       62.0 in Accession #:    [id number]     Weight:       192.2 lb Date of Birth:  11/04/1966      BSA:          1.880 m Patient Age:    29 years       BP:           127/77 mmHg Patient Gender: F              HR:           94 bpm. Exam  Location:  Inpatient Procedure: 2D Echo, Limited Echo, Cardiac Doppler, Color Doppler and Saline            Contrast Bubble Study Indications:    Stroke  History:        Patient has prior history of Echocardiogram examinations, most                 recent 04/08/2022. CHF, CAD; Risk Factors:Hypertension, Diabetes                 and Current Smoker.  Sonographer:    Jyl Heinz Referring Phys: [id number] Hortencia Conradi MEIER IMPRESSIONS  1. Left ventricular ejection fraction, by estimation, is 40 to 45%. The left ventricle has mildly decreased function. The left ventricle demonstrates global hypokinesis. There is mild left ventricular hypertrophy. Left ventricular diastolic parameters are consistent with Grade II diastolic dysfunction (pseudonormalization).  2. Right ventricular systolic function is normal. The right ventricular size is normal.  3. The pericardial effusion is circumferential.  4. The mitral valve is normal in structure. No evidence of mitral valve regurgitation. No evidence of mitral stenosis.  5. The aortic valve is normal in structure. Aortic valve regurgitation is trivial. No aortic stenosis is present.  6. The inferior vena cava is normal in size with greater than 50% respiratory variability, suggesting right atrial pressure of 3 mmHg.  7. Agitated saline contrast bubble study was negative, with no evidence of any interatrial shunt. Comparison(s): Prior images reviewed side by side. The left ventricular function has improved. Conclusion(s)/Recommendation(s): No intracardiac source of embolism detected on this transthoracic study. Consider a transesophageal echocardiogram to exclude cardiac source of embolism if clinically indicated. FINDINGS  Left Ventricle: Left ventricular ejection fraction, by estimation, is 40 to 45%. The left ventricle has mildly decreased function. The left ventricle demonstrates global hypokinesis. The left ventricular internal cavity size was normal in size. There is  mild left  ventricular hypertrophy. Left ventricular diastolic parameters are consistent with Grade II diastolic dysfunction (pseudonormalization). Right Ventricle: The right ventricular size is normal. No increase in right ventricular wall thickness. Right ventricular systolic function is normal. Left Atrium: Left atrial size was normal in size. Right Atrium: Right atrial size was normal in size. Pericardium: Trivial pericardial effusion is present. The pericardial effusion is circumferential. Mitral Valve: The mitral valve  is normal in structure. There is mild thickening of the mitral valve leaflet(s). There is mild calcification of the mitral valve leaflet(s). No evidence of mitral valve regurgitation. No evidence of mitral valve stenosis. Tricuspid Valve: The tricuspid valve is normal in structure. Tricuspid valve regurgitation is not demonstrated. No evidence of tricuspid stenosis. Aortic Valve: The aortic valve is normal in structure. Aortic valve regurgitation is trivial. Aortic regurgitation PHT measures 470 msec. No aortic stenosis is present. Aortic valve peak gradient measures 14.4 mmHg. Pulmonic Valve: The pulmonic valve was normal in structure. Pulmonic valve regurgitation is not visualized. No evidence of pulmonic stenosis. Aorta: The aortic root is normal in size and structure. Venous: The inferior vena cava is normal in size with greater than 50% respiratory variability, suggesting right atrial pressure of 3 mmHg. IAS/Shunts: No atrial level shunt detected by color flow Doppler. Agitated saline contrast was given intravenously to evaluate for intracardiac shunting. Agitated saline contrast bubble study was negative, with no evidence of any interatrial shunt. There  is no evidence of a patent foramen ovale.  LEFT VENTRICLE PLAX 2D LVIDd:         4.70 cm      Diastology LVIDs:         3.50 cm      LV e' medial:    6.31 cm/s LV PW:         1.40 cm      LV E/e' medial:  14.7 LV IVS:        1.40 cm      LV e'  lateral:   7.18 cm/s LVOT diam:     1.80 cm      LV E/e' lateral: 13.0 LV SV:         47 LV SV Index:   25 LVOT Area:     2.54 cm  LV Volumes (MOD) LV vol d, MOD A2C: 110.0 ml LV vol d, MOD A4C: 128.0 ml LV vol s, MOD A2C: 69.1 ml LV vol s, MOD A4C: 80.3 ml LV SV MOD A2C:     40.9 ml LV SV MOD A4C:     128.0 ml LV SV MOD BP:      43.6 ml RIGHT VENTRICLE             IVC RV S prime:     11.40 cm/s  IVC diam: 1.50 cm TAPSE (M-mode): 2.0 cm LEFT ATRIUM         Index LA diam:    3.20 cm 1.70 cm/m  AORTIC VALVE AV Area (Vmax): 1.51 cm AV Vmax:        190.00 cm/s AV Peak Grad:   14.4 mmHg LVOT Vmax:      113.00 cm/s LVOT Vmean:     81.200 cm/s LVOT VTI:       0.184 m AI PHT:         470 msec  AORTA Ao Root diam: 2.70 cm Ao Asc diam:  3.00 cm MITRAL VALVE MV Area (PHT): 4.83 cm    SHUNTS MV Decel Time: 157 msec    Systemic VTI:  0.18 m MV E velocity: 93.00 cm/s  Systemic Diam: 1.80 cm MV A velocity: 73.30 cm/s MV E/A ratio:  1.27 Candee Furbish MD Electronically signed by Candee Furbish MD Signature Date/Time: 04/22/2022/1:20:57 PM    Final    CT Angio Chest/Abd/Pel for Dissection W and/or Wo Contrast  Result Date: 04/20/2022 CLINICAL DATA:  Epigastric discomfort with nausea, vomiting, and shortness of breath.  Hypertensive. EXAM: CT ANGIOGRAPHY CHEST, ABDOMEN AND PELVIS TECHNIQUE: Non-contrast CT of the chest was initially obtained. Multidetector CT imaging through the chest, abdomen and pelvis was performed using the standard protocol during bolus administration of intravenous contrast. Multiplanar reconstructed images and MIPs were obtained and reviewed to evaluate the vascular anatomy. RADIATION DOSE REDUCTION: This exam was performed according to the departmental dose-optimization program which includes automated exposure control, adjustment of the mA and/or kV according to patient size and/or use of iterative reconstruction technique. CONTRAST:  166mL OMNIPAQUE IOHEXOL 350 MG/ML SOLN COMPARISON:  CT chest, abdomen, and  pelvis dated April 08, 2022. FINDINGS: CTA CHEST FINDINGS Cardiovascular: Preferential opacification of the thoracic aorta. No evidence of thoracic aortic aneurysm or dissection. Normal heart size. Unchanged small pericardial effusion. No central pulmonary embolism. Mediastinum/Nodes: No enlarged mediastinal, hilar, or axillary lymph nodes. Thyroid gland, trachea, and esophagus demonstrate no significant findings. Lungs/Pleura: Minimal subsegmental atelectasis at both lung bases. No focal consolidation, pleural effusion, or pneumothorax. Musculoskeletal: Chronic left breast seroma is unchanged, presumably postsurgical given history of prior lumpectomy. No acute or significant osseous findings. Review of the MIP images confirms the above findings. CTA ABDOMEN AND PELVIS FINDINGS VASCULAR Aorta: Normal caliber aorta without aneurysm, dissection, vasculitis or significant stenosis. Unchanged calcified and noncalcified atherosclerotic plaque. Celiac: Patent with similar mild stenosis of the proximal artery. No evidence of aneurysm, dissection, or vasculitis. SMA: Patent without evidence of aneurysm, dissection, vasculitis or significant stenosis. Renals: Unchanged severe stenosis of the right renal artery origin. The left renal artery is unremarkable. IMA: Occluded. 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 Veins: No obvious venous abnormality within the limitations of this arterial phase study. Review of the MIP images confirms the above findings. NON-VASCULAR Hepatobiliary: No focal liver abnormality is seen. No gallstones, gallbladder wall thickening, or biliary dilatation. Pancreas: Unremarkable. No pancreatic ductal dilatation or surrounding inflammatory changes. Spleen: Normal in size without focal abnormality. Adrenals/Urinary Tract: Adrenal glands are unremarkable. Similar patchy cortical hypodensity in the upper pole of the right kidney with increasing small amount of  perinephric fluid. The left kidney is unremarkable. No hydronephrosis. Bladder is unremarkable. Stomach/Bowel: Unchanged small hiatal hernia. The stomach is otherwise within normal limits. No bowel wall thickening, distention, or surrounding inflammatory changes. Normal appendix. Lymphatic: No enlarged abdominal or pelvic lymph nodes. Reproductive: Status post hysterectomy. No adnexal masses. Other: No abdominal wall hernia or abnormality. No abdominopelvic ascites. No pneumoperitoneum. Musculoskeletal: No acute or significant osseous findings. Chronic bilateral sacroiliitis. Review of the MIP images confirms the above findings. IMPRESSION: 1. No evidence of acute aortic syndrome or thoracoabdominal aortic aneurysm. 2. Similar patchy cortical hypodensity in the upper pole of the right kidney with increasing small amount of perinephric fluid, concerning for pyelonephritis versus infarct. 3. Unchanged severe stenosis of the right renal artery origin. 4. Chronically occluded IMA, left internal iliac artery, and proximal left SFA. 5. Unchanged small pericardial effusion. Electronically Signed   By: Titus Dubin M.D.   On: 04/20/2022 17:39   CT Angio Chest/Abd/Pel for Dissection W and/or Wo Contrast  Result Date: 04/08/2022 CLINICAL DATA:  Chest pain and back pain. EXAM: CT ANGIOGRAPHY CHEST, ABDOMEN AND PELVIS TECHNIQUE: Non-contrast CT of the chest was initially obtained. Multidetector CT imaging through the chest, abdomen and pelvis was performed using the standard protocol during bolus administration of intravenous contrast. Multiplanar reconstructed images and MIPs were obtained and reviewed to evaluate the vascular anatomy. RADIATION DOSE REDUCTION: This exam was performed according to  the departmental dose-optimization program which includes automated exposure control, adjustment of the mA and/or kV according to patient size and/or use of iterative reconstruction technique. CONTRAST:  15mL OMNIPAQUE  IOHEXOL 350 MG/ML SOLN COMPARISON:  None. FINDINGS: CTA CHEST FINDINGS Cardiovascular: Preferential opacification of the thoracic aorta. No evidence of thoracic aortic aneurysm or dissection. Normal heart size. There is a small pericardial effusion. Mediastinum/Nodes: No enlarged mediastinal, hilar, or axillary lymph nodes. Thyroid gland, trachea, and esophagus demonstrate no significant findings. Lungs/Pleura: There is minimal atelectasis in the inferior left upper lobe. The lungs are otherwise clear. No pleural effusion or pneumothorax identified. Musculoskeletal: No acute fractures are identified. There is an ill-defined low attenuation collection in the left breast measuring approximately 4.0 x 7.4 x 3.0 cm. There is no surrounding enhancement. Review of the MIP images confirms the above findings. CTA ABDOMEN AND PELVIS FINDINGS VASCULAR Aorta: Normal caliber aorta without aneurysm, dissection, vasculitis or significant stenosis. There is severe atherosclerotic disease throughout the aorta. There ulcerated plaques in the proximal abdominal aorta. There is also calcified atherosclerotic disease. Celiac: Patent without evidence of aneurysm, dissection, vasculitis or significant stenosis. SMA: Patent without evidence of aneurysm, dissection, vasculitis or significant stenosis. Renals: Severe focal stenosis origin right renal artery. Left renal artery within normal limits. IMA: Not seen. Inflow: Patent without evidence of aneurysm, dissection, vasculitis or significant stenosis. There is occlusion of the visualized proximal left femoral artery. Veins: No obvious venous abnormality within the limitations of this arterial phase study. Review of the MIP images confirms the above findings. NON-VASCULAR Hepatobiliary: No focal liver abnormality is seen. No gallstones, gallbladder wall thickening, or biliary dilatation. Pancreas: Unremarkable. No pancreatic ductal dilatation or surrounding inflammatory changes. Spleen:  Normal in size without focal abnormality. Adrenals/Urinary Tract: There is some patchy areas of cortical hypodensity in the superior pole the right kidney. There is no hydronephrosis or perinephric fluid. Adrenal glands and bladder are within normal limits. Stomach/Bowel: Stomach is within normal limits. Appendix appears normal. No evidence of bowel wall thickening, distention, or inflammatory changes. Lymphatic: No enlarged lymph nodes are seen. Reproductive: Status post hysterectomy. No adnexal masses. Other: No abdominal wall hernia or abnormality. No abdominopelvic ascites. Musculoskeletal: No acute or significant osseous findings. Review of the MIP images confirms the above findings. IMPRESSION: 1. No evidence for aortic dissection or aneurysm. 2. Severe atherosclerotic disease of the abdominal aorta with noncalcified plaque and ulcerated plaque. 3. 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 4. Severe focal stenosis origin of the right renal artery. 5. IMA not visualized. 6. Patchy hypodensity superior pole the right kidney, indeterminate. Findings may represent pyelonephritis or infarct. Other focal lesion can not be excluded. Consider follow-up evaluation with dedicated renal CT or MRI. 7. Small pericardial effusion. 8. Low-density collection in the left breast, indeterminate. Findings may be related to fluid collection or resolving hematoma. Recommend clinical correlation. Consider ultrasound. Electronically Signed   By: Ronney Asters M.D.   On: 04/08/2022 00:53   VAS [place] LOWER EXTREMITY VENOUS (DVT)  Result Date: 04/09/2022  Lower Venous DVT Study Patient Name:  FAYA VERONICA  Date of Exam:   04/09/2022 Medical Rec #: [id number]       Accession #:    [id number] Date of Birth: 13-Jan-1966       Patient Gender: F Patient Age:   17 years Exam Location:  [facility] Procedure:      VAS [place] LOWER EXTREMITY VENOUS (DVT) Referring Phys: Phillips Climes  --------------------------------------------------------------------------------  Indications: D-dimer  1.87.  Comparison Study: No prior venous studies. Left femoral artery occlusion on CTA Performing Technologist: Darlin Coco RDMS, RVT  Examination Guidelines: A complete evaluation includes B-mode imaging, spectral Doppler, color Doppler, and power Doppler as needed of all accessible portions of each vessel. Bilateral testing is considered an integral part of a complete examination. Limited examinations for reoccurring indications may be performed as noted. The reflux portion of the exam is performed with the patient in reverse Trendelenburg.  +---------+---------------+---------+-----------+----------+--------------+ RIGHT    CompressibilityPhasicitySpontaneityPropertiesThrombus Aging +---------+---------------+---------+-----------+----------+--------------+ CFV      Full           Yes      Yes                                 +---------+---------------+---------+-----------+----------+--------------+ SFJ      Full                                                        +---------+---------------+---------+-----------+----------+--------------+ FV Prox  Full                                                        +---------+---------------+---------+-----------+----------+--------------+ FV Mid   Full                                                        +---------+---------------+---------+-----------+----------+--------------+ FV DistalFull                                                        +---------+---------------+---------+-----------+----------+--------------+ PFV      Full                                                        +---------+---------------+---------+-----------+----------+--------------+ POP      Full           Yes      Yes                                 +---------+---------------+---------+-----------+----------+--------------+  PTV      Full                                                        +---------+---------------+---------+-----------+----------+--------------+ PERO     Full                                                        +---------+---------------+---------+-----------+----------+--------------+  Gastroc  Full                                                        +---------+---------------+---------+-----------+----------+--------------+   +---------+---------------+---------+-----------+----------+--------------+ LEFT     CompressibilityPhasicitySpontaneityPropertiesThrombus Aging +---------+---------------+---------+-----------+----------+--------------+ CFV      Full           Yes      Yes                                 +---------+---------------+---------+-----------+----------+--------------+ SFJ      Full                                                        +---------+---------------+---------+-----------+----------+--------------+ FV Prox  Full                                                        +---------+---------------+---------+-----------+----------+--------------+ FV Mid   Full                                                        +---------+---------------+---------+-----------+----------+--------------+ FV DistalFull                                                        +---------+---------------+---------+-----------+----------+--------------+ PFV      Full                                                        +---------+---------------+---------+-----------+----------+--------------+ POP      Full           Yes      Yes                                 +---------+---------------+---------+-----------+----------+--------------+ PTV      Full                                                        +---------+---------------+---------+-----------+----------+--------------+ PERO     Full                                                         +---------+---------------+---------+-----------+----------+--------------+  Gastroc  Full                                                        +---------+---------------+---------+-----------+----------+--------------+     Summary: RIGHT: - There is no evidence of deep vein thrombosis in the lower extremity.  - No cystic structure found in the popliteal fossa.  LEFT: - There is no evidence of deep vein thrombosis in the lower extremity.  - No cystic structure found in the popliteal fossa.  *See table(s) above for measurements and observations. Electronically signed by Monica Martinez MD on 04/09/2022 at 8:47:30 PM.    Final      PHYSICAL EXAM  Temp:  [98.5 F (36.9 C)-99.2 F (37.3 C)] 98.6 F (37 C) (05/03 1538) Pulse Rate:  [80-124] 88 (05/03 1400) Resp:  [17-29] 25 (05/03 1400) BP: (94-144)/(67-114) 94/73 (05/03 1400) SpO2:  [92 %-100 %] 92 % (05/03 1400) Weight:  [87.2 kg] 87.2 kg (05/03 0500)  General - Well nourished, well developed, drowsy sleepy.  Ophthalmologic - fundi not visualized due to noncooperation.  Cardiovascular - Regular rhythm and rate.  Neuro - drowsy sleepy, however, ED arousable with voice, orientated to age, place, but not to time. No aphasia, positive speech, following most simple commands. Able to name and repeat. No gaze palsy, tracking bilaterally, visual field full, PERRL. No facial droop. Tongue midline.  Right upper and lower extremity 4/5, left upper and lower extremity giveaway weakness and lack of effort.  Right facial and left upper and lower extremity decreased light touch sensation, b/l FTN intact grossly but very slow, gait not tested.     ASSESSMENT/PLAN Ms. LESLI SWARBRICK is a 56 y.o. female with history of hypertension, diabetes, chest pain status post cardiac cath, mild cardiomyopathy, smoker, LBP admitted for nausea vomiting, chest pain, shortness of breath, headache, left facial numbness, dizziness for  several days. No tPA given due to outside window.    Stroke:  right pontine infarct and punctate left hippocampus and frontal white matter infarcts, secondary to small vessel disease vs. cardioembolic source CT no acute abnormality MRI  right pontine infarct and punctate left hippocampus and frontal white matter infarcts MRA pending Carotid Doppler pending 2D Echo EF 40 to 45%, no PFO EEG normal, no seizure Recommend 30-day CardioNet monitoring to rule out A-fib LDL 102 HgbA1c 6.8 UDS positive for THC Heparin subcu for VTE prophylaxis clopidogrel 75 mg daily prior to admission, now on clopidogrel 75 mg daily.  Ongoing aggressive stroke risk factor management Therapy recommendations: Pending Disposition: Pending  Cardiomyopathy 4/18 presented with chest pain status post cardiac cath showed no significant coronary artery stenosis 4/19 EF 35 to 40% This admission EF 40 to 45%  PVD CTA aorta severe atherosclerosis abdominal aorta R.  Occlusion of left femoral artery, left SFA, IMA, left internal iliac artery, severe stenosis right renal artery  Diabetes HgbA1c 6.8 goal < 7.0 Controlled CBG monitoring SSI DM education and close PCP follow up  Hypertensive urgency-> hypotension BP fluctuate Off Cardene Avoid low BP Long term BP goal normotensive  Hyperlipidemia Home meds: Lipitor 80 LDL 102, goal < 70 Now on Lipitor 80 We will add Zetia Continue statin at discharge  Tobacco abuse Current smoker Smoking cessation counseling provided Pt is willing to quit  Other Stroke Risk Factors Obesity, Body mass index  is 35.16 kg/m.  THC addiction Coronary artery disease  Other Active Problems AKI, creatinine 1.32-> 2.17 Leukocytosis WBC 11.6  Hospital day # 1  This patient is critically ill due to multifocal stroke, hypertensive urgency, cardiomyopathy, PVD and at significant risk of neurological worsening, death form recurrent stroke, hypertensive encephalopathy, heart  failure. This patient's care requires constant monitoring of vital signs, hemodynamics, respiratory and cardiac monitoring, review of multiple databases, neurological assessment, discussion with family, other specialists and medical decision making of high complexity. I spent 35 minutes of neurocritical care time in the care of this patient.  I discussed with Dr. Verlee Monte.    Rosalin Hawking, MD PhD Stroke Neurology 04/22/2022 4:03 PM    To contact Stroke Continuity provider, please refer to [email or web address] After hours, contact General Neurology

## 2022-04-22 NOTE — TOC Progression Note (Signed)
Transition of Care (TOC) - Progression Note  ? ? ?Patient Details  ?Name: Erin Jackson ?MRN: [id number] ?Date of Birth: 1966-02-19 ? ?Transition of Care (TOC) CM/SW Contact  ?Angelita Ingles, RN ?Phone Number:[phone] ? ?04/22/2022, 12:59 PM ? ?Clinical Narrative:    ? ?Transition of Care (TOC) Screening Note ? ? ?Patient Details  ?Name: Erin Jackson ?Date of Birth: May 07, 1966 ? ? ?Transition of Care (TOC) CM/SW Contact:    ?Angelita Ingles, RN ?Phone Number: ?04/22/2022, 12:59 PM ? ? ? ?Transition of Care Department [facility]) has reviewed patient and no TOC needs have been identified at this time. We will continue to monitor patient advancement through interdisciplinary progression rounds. If new patient transition needs arise, please place a TOC consult. ? ? ? ? ?  ?  ? ?Expected Discharge Plan and Services ?  ?  ?  ?  ?  ?                ?  ?  ?  ?  ?  ?  ?  ?  ?  ?  ? ? ?Social Determinants of Health (SDOH) Interventions ?  ? ?Readmission Risk Interventions ?   ? View : No data to display.  ?  ?  ?  ? ? ?

## 2022-04-22 NOTE — TOC CAGE-AID Note (Signed)
Transition of Care (TOC) - CAGE-AID Screening ? ? ?Patient Details  ?Name: CHAITRA NESTER ?MRN: [id number] ?Date of Birth: [birth year] ? ?Transition of Care (TOC) CM/SW Contact:    ?Camara Rosander C Tarpley-Carter, LCSWA ?Phone Number: ?04/22/2022, 2:49 PM ? ? ?Clinical Narrative: ?Pt participated in [place].  Pt stated she does not use substance.  Pt was offered resources, due to usage of substance.   ? ?Passenger transport manager, MSW, LCSW-A ?Pronouns:  She/Her/Hers ?[facility] ?Clinical Social Worker ?Direct Number:  [phone] ?[email or web address] ? ? ?CAGE-AID Screening: ?  ? ?Have You Ever Felt You Ought to Cut Down on Your Drinking or Drug Use?: No ?Have People Annoyed You By Critizing Your Drinking Or Drug Use?: No ?Have You Felt Bad Or Guilty About Your Drinking Or Drug Use?: No ?Have You Ever Had a Drink or Used Drugs First Thing In The Morning to Steady Your Nerves or to Get Rid of a Hangover?: No ?CAGE-AID Score: 0 ? ?Substance Abuse Education Offered: Yes ? ?Substance abuse interventions: Educational Materials ? ? ? ? ? ? ?

## 2022-04-22 NOTE — Evaluation (Signed)
Occupational Therapy Evaluation Patient Details Name: Erin Jackson MRN: [id number] DOB: 01-Jul-1966 Today's Date: 04/22/2022   History of Present Illness Pt is a 56 y/o F [facility] on [facility] with n/v and SOB. Found to have encephalopathy and seizure-like activity in the context of severe HTN. MRI shows small acute infarcts of left frontal white matter, hippocampus, and R pons. Pt in the ICU to monitor her tachycardia, HTN, and seizure activity.  CT head negative. PMH of HTN, DM, Anxiety, GERD, and recent heart cath on 4/20.   Clinical Impression   Pt admitted for concerns listed above. PTA pt reported that she did not require any assist for mobility and family provided minimal assist for BADL's. At this time, pt presents with increased weakness, ataxia, balance deficits, vision deficits, and cognitive deficits. Pt is unable to lift BUE against gravity without assist, when attempting to lift either UE, pt demonstrates ataxic movements, with difficulty controlling where each arm goes.Visually pt is reporting dizziness, diplopia, and pain in the L eye, also found to have L peripheral vision field cut. Requiring mod A +2 for all bed mobility and sit<>stands with small shuffled side steps. Recommending AIR to maximize her independence and safety. OT will follow acutely.      Recommendations for follow up therapy are one component of a multi-disciplinary discharge planning process, led by the attending physician.  Recommendations may be updated based on patient status, additional functional criteria and insurance authorization.   Follow Up Recommendations  Acute inpatient rehab (3hours/day)    Assistance Recommended at Discharge Frequent or constant Supervision/Assistance  Patient can return home with the following Two people to help with walking and/or transfers;Two people to help with bathing/dressing/bathroom;Assistance with feeding;Assistance with cooking/housework;Direct supervision/assist for  medications management;Direct supervision/assist for financial management;Assist for transportation;Help with stairs or ramp for entrance    Functional Status Assessment  Patient has had a recent decline in their functional status and demonstrates the ability to make significant improvements in function in a reasonable and predictable amount of time.  Equipment Recommendations  BSC/3in1;Other (comment) (RW)    Recommendations for Other Services Rehab consult     Precautions / Restrictions Precautions Precautions: Fall Precaution Comments: Dizzy, diplopia, L side visual field cut Restrictions Weight Bearing Restrictions: No      Mobility Bed Mobility Overal bed mobility: Needs Assistance Bed Mobility: Supine to Sit, Sit to Supine     Supine to sit: Mod assist, +2 for physical assistance Sit to supine: Mod assist, +2 for physical assistance, +2 for safety/equipment   General bed mobility comments: Pt needs increased assist to pull up to sitting, then bring BLE into bed to return to supine    Transfers Overall transfer level: Needs assistance Equipment used: Rolling walker (2 wheels) Transfers: Sit to/from Stand Sit to Stand: Mod assist, +2 physical assistance, +2 safety/equipment           General transfer comment: Mod A for power up and steady, Mod A for stepping cues, walker management and trunk/hip support during side steps      Balance Overall balance assessment: Needs assistance Sitting-balance support: Feet supported, Single extremity supported Sitting balance-Leahy Scale: Fair Sitting balance - Comments: Requiring min guard to min A support EOB Postural control: Posterior lean Standing balance support: Bilateral upper extremity supported Standing balance-Leahy Scale: Poor Standing balance comment: REquiring mod +2 support in standing  ADL either performed or assessed with clinical judgement   ADL Overall ADL's : Needs  assistance/impaired Eating/Feeding: Maximal assistance;Sitting   Grooming: Maximal assistance;Sitting   Upper Body Bathing: Maximal assistance;Sitting   Lower Body Bathing: Maximal assistance;+2 for physical assistance;+2 for safety/equipment;Sitting/lateral leans;Sit to/from stand   Upper Body Dressing : Maximal assistance;Sitting   Lower Body Dressing: Maximal assistance;+2 for safety/equipment;+2 for physical assistance;Sitting/lateral leans;Sit to/from stand   Toilet Transfer: Moderate assistance;+2 for physical assistance;+2 for safety/equipment;Stand-pivot   Toileting- Clothing Manipulation and Hygiene: Maximal assistance;+2 for physical assistance;+2 for safety/equipment;Sit to/from stand;Sitting/lateral lean       Functional mobility during ADLs: Moderate assistance;+2 for physical assistance;+2 for safety/equipment General ADL Comments: Pt presents with global weakness, ataxia, dizziness, and visual deficits. Requiring increased assist for all tasks.     Vision Baseline Vision/History: 1 Wears glasses Ability to See in Adequate Light: 0 Adequate Patient Visual Report: Diplopia;Eye fatigue/eye pain/headache Vision Assessment?: Yes Eye Alignment: Within Functional Limits Ocular Range of Motion: Within Functional Limits Alignment/Gaze Preference: Within Defined Limits Tracking/Visual Pursuits: Impaired - to be further tested in functional context (Pt gets dizzy with tracking/looking to the left) Saccades: Other (comment) (unable to tolerate saccadic movement due to dizziness and eye pain) Visual Fields: Left visual field deficit Diplopia Assessment: Disappears with one eye closed;Objects split side to side Additional Comments: Pt has increased dizziness/eye pain looking to the L. Tried occluded glasses on the L, as her R eye has no double vision present, pt reports that it does not help. Had increased dizziness and L eye pain after removing glasses. Nystagmus noted when pt  looks far left     Perception     Praxis      Pertinent Vitals/Pain Pain Assessment Pain Assessment: 0-10 Pain Score: 8  Pain Location: Head, eyes, cramp in L calf Pain Descriptors / Indicators: Aching, Cramping, Discomfort Pain Intervention(s): Limited activity within patient's tolerance, Monitored during session, Repositioned     Hand Dominance Right   Extremity/Trunk Assessment Upper Extremity Assessment Upper Extremity Assessment: RUE deficits/detail;LUE deficits/detail RUE Deficits / Details: Requiring assist to flex biceps, does not activate triceps to control extending, unable to flex/abduct or go against gravity with shoulders due to weakness. RUE Coordination: decreased fine motor;decreased gross motor LUE Deficits / Details: Requiring assist to flex biceps, does not activate triceps to control extending, unable to flex/abduct or go against gravity with shoulders due to weakness. LUE Coordination: decreased fine motor;decreased gross motor   Lower Extremity Assessment Lower Extremity Assessment: Defer to PT evaluation   Cervical / Trunk Assessment Cervical / Trunk Assessment: Normal   Communication Communication Communication: Other (comment) (delayed, low speech)   Cognition Arousal/Alertness: Lethargic Behavior During Therapy: Flat affect, Anxious Overall Cognitive Status: Impaired/Different from baseline Area of Impairment: Attention, Following commands, Safety/judgement, Awareness, Problem solving, Orientation                 Orientation Level: Disoriented to, Time Current Attention Level: Selective   Following Commands: Follows one step commands with increased time   Awareness: Emergent Problem Solving: Slow processing General Comments: Pt with waxing/waning attention, requiring verbal cuing, Slow to respond both physically and verbally     General Comments  VSS on RA, vitals remained stable throughout entire session.    Exercises      Shoulder Instructions      Home Living Family/patient expects to be discharged to:: Private residence Living Arrangements: Children Available Help at Discharge: Family;Available PRN/intermittently Type of Home: Apartment Home Access: Stairs  to enter Entrance Stairs-Number of Steps: 3 Entrance Stairs-Rails: None Home Layout: One level     Bathroom Shower/Tub: Chief Strategy Officer: Standard     Home Equipment: None          Prior Functioning/Environment Prior Level of Function : Needs assist             Mobility Comments: independent however reports she could use an AD ADLs Comments: sister or niece assists with bathing, family helps with cooking and cleaning, no longer drives.        OT Problem List: Decreased strength;Impaired balance (sitting and/or standing);Decreased activity tolerance;Decreased range of motion;Impaired vision/perception;Decreased cognition;Decreased safety awareness;Decreased knowledge of use of DME or AE;Impaired UE functional use;Pain      OT Treatment/Interventions: Self-care/ADL training;Therapeutic exercise;Energy conservation;DME and/or AE instruction;Therapeutic activities;Patient/family education;Balance training    OT Goals(Current goals can be found in the care plan section) Acute Rehab OT Goals Patient Stated Goal: To feel better OT Goal Formulation: With patient Time For Goal Achievement: 05/06/22 Potential to Achieve Goals: Good ADL Goals Additional ADL Goal #1: Pt will tolerate EOB functional ADL task for 5 mins, to improve activity tolerance as a precursor for standing ADL's. Additional ADL Goal #2: Pt will find 4 items located on her L side independently. Additional ADL Goal #3: Pt will ambulate to bathroom to complete toileting and grooming with min guard assist.  OT Frequency: Min 2X/week    Co-evaluation              AM-PAC OT "6 Clicks" Daily Activity     Outcome Measure Help from another person  eating meals?: A Lot Help from another person taking care of personal grooming?: A Lot Help from another person toileting, which includes using toliet, bedpan, or urinal?: A Lot Help from another person bathing (including washing, rinsing, drying)?: A Lot Help from another person to put on and taking off regular upper body clothing?: A Lot Help from another person to put on and taking off regular lower body clothing?: A Lot 6 Click Score: 12   End of Session Equipment Utilized During Treatment: Gait belt Nurse Communication: Mobility status  Activity Tolerance: Patient limited by lethargy Patient left: in bed;with call bell/phone within reach  OT Visit Diagnosis: Unsteadiness on feet (R26.81);Other abnormalities of gait and mobility (R26.89);Muscle weakness (generalized) (M62.81)                Time: 7793-9030 OT Time Calculation (min): 30 min Charges:  OT General Charges $OT Visit: 1 Visit OT Evaluation $OT Eval Moderate Complexity: 1 Mod OT Treatments $Therapeutic Activity: 8-22 mins  Merci Walthers H., OTR/L Acute Rehabilitation  Ahliya Glatt Elane Bing Plume 04/22/2022, 3:31 PM

## 2022-04-22 NOTE — Evaluation (Signed)
Clinical/Bedside Swallow Evaluation ?Patient Details  ?Name: Erin Jackson ?MRN: [id number] ?Date of Birth: 1966/07/01 ? ?Today's Date: 04/22/2022 ?Time: SLP Start Time (ACUTE ONLY): 1030 SLP Stop Time (ACUTE ONLY): 1050 ?SLP Time Calculation (min) (ACUTE ONLY): 20 min ? ?Past Medical History:  ?Past Medical History:  ?Diagnosis Date  ? Anxiety   ? Chronic back pain   ? Diabetes mellitus without complication (HCC)   ? GERD (gastroesophageal reflux disease)   ? Hypertension   ? ?Past Surgical History:  ?Past Surgical History:  ?Procedure Laterality Date  ? ABDOMINAL HYSTERECTOMY    ? BREAST LUMPECTOMY Left 03/25/2022  ? Procedure: LEFT BREAST LUMPECTOMY;  Surgeon: Griselda Miner, MD;  Location: [facility];  Service: General;  Laterality: Left;  ? BREAST REDUCTION SURGERY Bilateral 08/15/2018  ? Procedure: BILATERAL MAMMARY REDUCTION  (BREAST);  Surgeon: Louisa Second, MD;  Location: [facility];  Service: Plastics;  Laterality: Bilateral;  ? BREAST REDUCTION SURGERY Left 09/25/2019  ? Procedure: EXCISION OF FAT NECROSIS OF LEFT BREAST;  Surgeon: Louisa Second, MD;  Location: [facility];  Service: Plastics;  Laterality: Left;  ? CARPAL TUNNEL RELEASE Right 06/17/2020  ? right thumb CMC arthroplasty with tendon transfer and right carpal tunnel release.   ? CERVICAL SPINE SURGERY  2015  ? has a metal plate  ? FOOT SURGERY Left 2014  ? HAND SURGERY    ? LEFT HEART CATH AND CORONARY ANGIOGRAPHY N/A 04/09/2022  ? Procedure: LEFT HEART CATH AND CORONARY ANGIOGRAPHY;  Surgeon: Orpah Cobb, MD;  Location: [facility];  Service: Cardiovascular;  Laterality: N/A;  ? REDUCTION MAMMAPLASTY Bilateral 07/2018  ? ?HPI:  ?Patient is a 56 y.o. female with PMH: HTN, DM, anxiety, GERD who presented to the hospital with left side facial numbness, dizziness when walking, n/v x 2 weeks, SOB when vomiting, HA, abdominal pain and dysuria. In ED, initial BP was 150/114 and patient reported  in ED that she did not take her BP meds morning of admission. CT head negative for acute intracranial abnormality, MRI brain showed small acute or subacute infarcts in left frontal white matter, left hippocampus and right pons; mild edema without mass effect. She passed [facility] with RN, however MD concerned with patient appearing hypovolemic from days of poor PO intake and n/v and requested SLP eval of swallow function.  ?  ?Assessment / Plan / Recommendation  ?Clinical Impression ? Patient presenting with clinical s/s of dysphagia as per this bedside/clinical swallow evaluation. During evaluation, patient appeared somewhat fatigued/lethargic, affect was very flat and she seemed overall apathetic. SLP was only able to elevate HOB 40% secondary to patient requesting "stop there" and that she was getting "swimmy headed". She did not exhibit any noticeable facial droop or assymetry but she was not able to participate in full oral motor exam. When taking sips of thin liquids (water) via straw sips, she was noted to have overall weakness of oral motor musculature, resulting in reduced ability to suck through straw. No overt s/s aspiration or penetration observed and swallow initiation was timely per palpation. SLP recommending initiate full liquids PO diet at this time and will follow patient for toleration and ability to upgrade with solids. ?SLP Visit Diagnosis: Dysphagia, unspecified (R13.10) ?   ?Aspiration Risk ? Mild aspiration risk  ?  ?Diet Recommendation Thin liquid;Other (Comment) (full liquids)  ? ?Liquid Administration via: Cup;Straw ?Medication Administration: Whole meds with liquid ?Supervision: Full supervision/cueing for compensatory strategies;Staff to assist with  self feeding ?Compensations: Slow rate;Small sips/bites;Minimize environmental distractions ?Postural Changes: Seated upright at 90 degrees  ?  ?Other  Recommendations Oral Care Recommendations: Oral care BID;Staff/trained caregiver to provide  oral care   ? ?Recommendations for follow up therapy are one component of a multi-disciplinary discharge planning process, led by the attending physician.  Recommendations may be updated based on patient status, additional functional criteria and insurance authorization. ? ?Follow up Recommendations Acute inpatient rehab (3hours/day)  ? ? ?  ?Assistance Recommended at Discharge Frequent or constant Supervision/Assistance  ?Functional Status Assessment Patient has had a recent decline in their functional status and demonstrates the ability to make significant improvements in function in a reasonable and predictable amount of time.  ?Frequency and Duration min 2x/week  ?1 week ?  ?   ? ?Prognosis Prognosis for Safe Diet Advancement: Good  ? ?  ? ?Swallow Study   ?General Date of Onset: 04/21/22 ?HPI: Patient is a 56 y.o. female with PMH: HTN, DM, anxiety, GERD who presented to the hospital with left side facial numbness, dizziness when walking, n/v x 2 weeks, SOB when vomiting, HA, abdominal pain and dysuria. In ED, initial BP was 150/114 and patient reported in ED that she did not take her BP meds morning of admission. CT head negative for acute intracranial abnormality, MRI brain showed small acute or subacute infarcts in left frontal white matter, left hippocampus and right pons; mild edema without mass effect. She passed [facility] with RN, however MD concerned with patient appearing hypovolemic from days of poor PO intake and n/v and requested SLP eval of swallow function. ?Type of Study: Bedside Swallow Evaluation ?Previous Swallow Assessment: none found ?Diet Prior to this Study: NPO ?Temperature Spikes Noted: No ?Respiratory Status: Nasal cannula ?History of Recent Intubation: No ?Behavior/Cognition: Alert;Lethargic/Drowsy;Cooperative ?Oral Cavity Assessment: Within Functional Limits ?Oral Care Completed by SLP: No ?Oral Cavity - Dentition: Adequate natural dentition ?Self-Feeding Abilities: Needs set up;Needs  assist ?Patient Positioning: Partially reclined ?Baseline Vocal Quality: Low vocal intensity ?Volitional Cough: Weak ?Volitional Swallow: Unable to elicit  ?  ?Oral/Motor/Sensory Function Overall Oral Motor/Sensory Function: Other (comment) (patient unable to fully participate in oral motor exam however no significant facial droop or weakness observed by SLP)   ?Ice Chips     ?Thin Liquid Thin Liquid: Impaired ?Presentation: Straw ?Oral Phase Functional Implications: Other (comment) (weak ability to suck through straw) ?Pharyngeal  Phase Impairments: Other (comments) (no overt s/s pharygneal phase dysphagia observed)  ?  ?Nectar Thick     ?Honey Thick     ?Puree Puree: Not tested   ?Solid ? ? ?  Solid: Not tested  ? ?  ?Angela Nevin, MA, CCC-SLP ?Speech Therapy ? ? ? ?

## 2022-04-23 ENCOUNTER — Inpatient Hospital Stay (HOSPITAL_COMMUNITY): Payer: Medicare Other

## 2022-04-23 DIAGNOSIS — I6340 Cerebral infarction due to embolism of unspecified cerebral artery: Secondary | ICD-10-CM | POA: Diagnosis not present

## 2022-04-23 DIAGNOSIS — I160 Hypertensive urgency: Secondary | ICD-10-CM | POA: Diagnosis not present

## 2022-04-23 DIAGNOSIS — F17200 Nicotine dependence, unspecified, uncomplicated: Secondary | ICD-10-CM | POA: Diagnosis not present

## 2022-04-23 DIAGNOSIS — I10 Essential (primary) hypertension: Secondary | ICD-10-CM

## 2022-04-23 DIAGNOSIS — F1210 Cannabis abuse, uncomplicated: Secondary | ICD-10-CM | POA: Diagnosis not present

## 2022-04-23 LAB — BASIC METABOLIC PANEL
Anion gap: 7 (ref 5–15)
BUN: 16 mg/dL (ref 6–20)
CO2: 28 mmol/L (ref 22–32)
Calcium: 7.6 mg/dL — ABNORMAL LOW (ref 8.9–10.3)
Chloride: 102 mmol/L (ref 98–111)
Creatinine, Ser: 1.46 mg/dL — ABNORMAL HIGH (ref 0.44–1.00)
GFR, Estimated: 41 mL/min — ABNORMAL LOW (ref >60)
Glucose, Bld: 140 mg/dL — ABNORMAL HIGH (ref 70–99)
Potassium: 3.4 mmol/L — ABNORMAL LOW (ref 3.5–5.1)
Sodium: 137 mmol/L (ref 135–145)

## 2022-04-23 LAB — GLUCOSE, CAPILLARY
Glucose-Capillary: 103 mg/dL — ABNORMAL HIGH (ref 70–99)
Glucose-Capillary: 200 mg/dL — ABNORMAL HIGH (ref 70–99)
Glucose-Capillary: 236 mg/dL — ABNORMAL HIGH (ref 70–99)
Glucose-Capillary: 120 mg/dL — ABNORMAL HIGH (ref 70–99)
Glucose-Capillary: 231 mg/dL — ABNORMAL HIGH (ref 70–99)
Glucose-Capillary: 179 mg/dL — ABNORMAL HIGH (ref 70–99)
Glucose-Capillary: 138 mg/dL — ABNORMAL HIGH (ref 70–99)

## 2022-04-23 LAB — MAGNESIUM: Magnesium: 2.6 mg/dL — ABNORMAL HIGH (ref 1.7–2.4)

## 2022-04-23 MED ORDER — POTASSIUM CHLORIDE CRYS ER 20 MEQ PO TBCR
40.0000 meq | EXTENDED_RELEASE_TABLET | ORAL | Status: AC
  Administered 2022-04-23 (×2): 40 meq via ORAL
  Filled 2022-04-23 (×2): qty 2

## 2022-04-23 NOTE — Progress Notes (Signed)
STROKE TEAM PROGRESS NOTE   SUBJECTIVE (INTERVAL HISTORY) Her son and sister are at the bedside.  Overall her condition is gradually improving. Pt still complaining of left arm pain which has been since her fall at home. Still has left foot tingling.  Educated on smoking cessation and lifestyle changes.   OBJECTIVE Temp:  [98.1 F (36.7 C)-99 F (37.2 C)] 98.1 F (36.7 C) (05/04 1601) Pulse Rate:  [86-119] 117 (05/04 1625) Cardiac Rhythm: Sinus tachycardia (05/04 0700) Resp:  [15-27] 18 (05/04 1625) BP: (89-157)/(74-99) 156/99 (05/04 1625) SpO2:  [86 %-99 %] 99 % (05/04 1625) Weight:  [89.4 kg] 89.4 kg (05/04 0500)  Recent Labs  Lab 04/22/22 2325 04/23/22 0353 04/23/22 0752 04/23/22 1206 04/23/22 1600  GLUCAP 129* 103* 200* 236* 120*   Recent Labs  Lab 04/20/22 1207 04/20/22 1249 04/21/22 0226 04/21/22 1238 04/22/22 0144 04/23/22 0154  NA 138 139 138 139 139 137  K 3.5 3.7 3.7 3.8 3.0* 3.4*  CL 101 100 101 104 100 102  CO2 24  --  17* 15* 28 28  GLUCOSE 200* 207* 322* 343* 124* 140*  BUN 14 16 20  21* 25* 16  CREATININE 1.28* 1.10* 1.32* 1.46* 2.17* 1.46*  CALCIUM 7.5*  --  7.2* 7.5* 7.0* 7.6*  MG  --   --  <0.5* <0.5* 3.3* 2.6*  PHOS  --   --  5.1*  --  5.4*  --    Recent Labs  Lab 04/20/22 1207  AST 19  ALT 13  ALKPHOS 65  BILITOT 0.4  PROT 7.7  ALBUMIN 3.1*   Recent Labs  Lab 04/20/22 1207 04/20/22 1249 04/21/22 0343 04/22/22 0144  WBC 10.3  --  17.7* 11.6*  NEUTROABS 6.9  --   --   --   HGB 13.1 14.6 14.1 10.7*  HCT 40.6 43.0 42.9 32.7*  MCV 91.2  --  86.8 88.9  PLT 276  --  354 246   Recent Labs  Lab 04/21/22 0343  CKTOTAL 258*   No results for input(s): LABPROT, INR in the last 72 hours. Recent Labs    04/21/22 0233  COLORURINE YELLOW  LABSPEC 1.044*  PHURINE 5.0  GLUCOSEU 150*  HGBUR MODERATE*  BILIRUBINUR NEGATIVE  KETONESUR 20*  PROTEINUR >=300*  NITRITE NEGATIVE  LEUKOCYTESUR NEGATIVE       Component Value Date/Time    CHOL 170 04/22/2022 0144   TRIG 225 (H) 04/22/2022 0144   HDL 23 (L) 04/22/2022 0144   CHOLHDL 7.4 04/22/2022 0144   VLDL 45 (H) 04/22/2022 0144   LDLCALC 102 (H) 04/22/2022 0144   Lab Results  Component Value Date   HGBA1C 6.8 (H) 04/22/2022      Component Value Date/Time   LABOPIA POSITIVE (A) 04/20/2022 2034   COCAINSCRNUR NONE DETECTED 04/20/2022 2034   LABBENZ NONE DETECTED 04/20/2022 2034   AMPHETMU NONE DETECTED 04/20/2022 2034   THCU POSITIVE (A) 04/20/2022 2034   LABBARB NONE DETECTED 04/20/2022 2034    No results for input(s): ETH in the last 168 hours.  I have personally reviewed the radiological images below and agree with the radiology interpretations.  DG Chest 1 View  Result Date: 04/14/2022 CLINICAL DATA:  Chest pain and itching. EXAM: CHEST  1 VIEW COMPARISON:  10/04/2018. FINDINGS: The heart is enlarged the mediastinal contour is within normal limits. Subsegmental atelectasis or scarring is noted in the mid left lung. No consolidation, effusion, or pneumothorax. Cervical spinal fusion hardware is noted. Degenerative changes are present  in the thoracic spine. IMPRESSION: 1. Mild subsegmental atelectasis or scarring in mid left lung. 2. Cardiomegaly. Electronically Signed   By: Brett Fairy M.D.   On: 04/14/2022 20:06   CT HEAD WO CONTRAST (5MM)  Result Date: 04/20/2022 CLINICAL DATA:  Nonspecific dizziness EXAM: CT HEAD WITHOUT CONTRAST TECHNIQUE: Contiguous axial images were obtained from the base of the skull through the vertex without intravenous contrast. RADIATION DOSE REDUCTION: This exam was performed according to the departmental dose-optimization program which includes automated exposure control, adjustment of the mA and/or kV according to patient size and/or use of iterative reconstruction technique. COMPARISON:  01/20/2017 FINDINGS: Brain: No acute infarct or hemorrhage. Lateral ventricles and midline structures are unremarkable. No acute extra-axial fluid  collections. No mass effect. Vascular: No hyperdense vessel or unexpected calcification. Skull: Normal. Negative for fracture or focal lesion. Sinuses/Orbits: No acute finding. Other: None. IMPRESSION: 1. No acute intracranial process.  Stable exam. Electronically Signed   By: Randa Ngo M.D.   On: 04/20/2022 15:18   MR ANGIO HEAD WO CONTRAST  Result Date: 04/22/2022 CLINICAL DATA:  Provided history: Stroke, follow-up. EXAM: MRA HEAD WITHOUT CONTRAST TECHNIQUE: Angiographic images of the Circle of Willis were acquired using MRA technique without intravenous contrast. COMPARISON:  Brain MRI 04/21/2022. FINDINGS: Anterior circulation: The intracranial internal carotid arteries are patent. Atherosclerotic irregularity of both vessels with no more than mild stenosis. The M1 middle cerebral arteries are patent. No M2 proximal branch occlusion is identified. Atherosclerotic irregularity of the M2 and more distal MCA vessels, bilaterally. Most notably, there is a moderate stenosis within a superior division mid-to-distal M2 left MCA vessel. The anterior cerebral arteries are patent. No intracranial aneurysm is identified. Posterior circulation: The intracranial vertebral arteries are patent. The right vertebral artery is dominant. The basilar artery is patent. The posterior cerebral arteries are patent. Mild atherosclerotic irregularity of both vessels. Sizable posterior communicating arteries with developmentally hypoplastic P1 segments, bilaterally. Anatomic variants: As described. IMPRESSION: No intracranial large vessel occlusion is identified. Intracranial atherosclerotic disease, as described. Most notably, there is a moderate focal stenosis within a superior division mid-to-distal M2 left MCA vessel. Electronically Signed   By: Kellie Simmering D.O.   On: 04/22/2022 15:43   MR BRAIN WO CONTRAST  Result Date: 04/21/2022 CLINICAL DATA:  Mental status change, unknown cause EXAM: MRI HEAD WITHOUT CONTRAST  TECHNIQUE: Multiplanar, multiecho pulse sequences of the brain and surrounding structures were obtained without intravenous contrast. COMPARISON:  CT head Apr 20, 2022.  MRI head November 12, 2020. FINDINGS: Brain: Small acute or subacute infarcts in the left frontal white matter and left hippocampus. Mild edema without mass effect. No evidence of acute hemorrhage, mass lesion, midline shift, hydrocephalus, or extra-axial fluid collection. Additional mild T2/FLAIR hyperintensities within the white matter, nonspecific but compatible with chronic microvascular ischemic disease. Partially empty sella. Vascular: Major arterial flow voids are maintained at the skull base. Skull and upper cervical spine: Normal marrow signal. Sinuses/Orbits: Clear sinuses.  No acute orbital findings. Other: No mastoid effusions. IMPRESSION: 1. Small acute or subacute infarcts in the left frontal white matter, left hippocampus, and right pons. Mild edema without mass effect. Given involvement of multiple vascular territories, consider embolic etiology. 2. Mild chronic microvascular ischemic disease. 3. Partially empty sella, which is often a normal anatomic variant but can be associated with idiopathic intracranial hypertension. Electronically Signed   By: Margaretha Sheffield M.D.   On: 04/21/2022 16:34   CARDIAC CATHETERIZATION  Result Date: 04/09/2022   Colon Flattery  LAD to Prox LAD lesion is 50% stenosed.   Mid Cx lesion is 40% stenosed.   Dist Cx lesion is 50% stenosed. Life style modification for mild to moderate multivessel disease.   DG Chest Port 1 View  Result Date: 04/22/2022 CLINICAL DATA:  Respiratory failure. EXAM: PORTABLE CHEST 1 VIEW COMPARISON:  04/20/2022 and CT chest 04/20/2022. FINDINGS: Trachea is midline. Heart size stable. Increasing bibasilar streaky and patchy airspace opacification. There may be a small left pleural effusion. IMPRESSION: 1. Increasing bibasilar atelectasis. Difficult to exclude developing pneumonia in  the right lung base. 2. Possible small left pleural effusion. Electronically Signed   By: Lorin Picket M.D.   On: 04/22/2022 08:34   DG Chest Port 1 View  Result Date: 04/20/2022 CLINICAL DATA:  Pt poor historian. Per PA: Pt complains of shortness of breath. Participates minimally in history, vomiting (clear) in triage, upper abdominal discomfort. EXAM: PORTABLE CHEST - 1 VIEW COMPARISON:  04/14/2022 FINDINGS: Perihilar and bibasilar interstitial edema or infiltrates, worse on right than left, new since previous. Heart size and mediastinal contours are within normal limits. No effusion. Cervical fixation hardware noted. IMPRESSION: New asymmetric perihilar and bibasilar infiltrates or edema, right worse than left. Electronically Signed   By: Lucrezia Europe M.D.   On: 04/20/2022 14:09   EEG adult  Result Date: 04/21/2022 Lora Havens, MD     04/21/2022 12:07 PM Patient Name: Erin Jackson MRN: [id number] Epilepsy Attending: Lora Havens Referring Physician/Provider: Kerney Elbe, MD Date: 04/21/2022 Duration: 22.10 mins Patient history: 56 year old female presenting with encephalopathy and seizure-like activity in the context of severe HTN.  EEG to evaluate for seizure. Level of alertness: Awake,asleep AEDs during EEG study: LEV Technical aspects: This EEG study was done with scalp electrodes positioned according to the 10-20 International system of electrode placement. Electrical activity was acquired at a sampling rate of 500Hz  and reviewed with a high frequency filter of 70Hz  and a low frequency filter of 1Hz . EEG data were recorded continuously and digitally stored. Description: The posterior dominant rhythm consists of 8-9 Hz activity of moderate voltage (25-35 uV) seen predominantly in posterior head regions, symmetric and reactive to eye opening and eye closing. Sleep was characterized by vertex waves, sleep spindles (12 to 14 Hz), maximal frontocentral region.  Hyperventilation and photic  stimulation were not performed.   IMPRESSION: This study is within normal limits. No seizures or epileptiform discharges were seen throughout the recording. Priyanka O Yadav   VAS [place] ABI WITH/WO TBI  Result Date: 04/09/2022  LOWER EXTREMITY DOPPLER STUDY Patient Name:  Erin Jackson  Date of Exam:   04/08/2022 Medical Rec #: [id number]       Accession #:    [id number] Date of Birth: February 21, 1966       Patient Gender: F Patient Age:   82 years Exam Location:  [facility] Procedure:      VAS [place] ABI WITH/WO TBI Referring Phys: Aldona Bar RHYNE --------------------------------------------------------------------------------  Indications: Femoral artery occlusion, left. High Risk Factors: Hypertension, Diabetes.  Comparison Study: no prior Performing Technologist: Archie Patten RVS  Examination Guidelines: A complete evaluation includes at minimum, Doppler waveform signals and systolic blood pressure reading at the level of bilateral brachial, anterior tibial, and posterior tibial arteries, when vessel segments are accessible. Bilateral testing is considered an integral part of a complete examination. Photoelectric Plethysmograph (PPG) waveforms and toe systolic pressure readings are included as required and additional duplex testing as needed. Limited examinations for  reoccurring indications may be performed as noted.  ABI Findings: +---------+------------------+-----+---------+--------+ Right    Rt Pressure (mmHg)IndexWaveform Comment  +---------+------------------+-----+---------+--------+ Brachial 155                    triphasic         +---------+------------------+-----+---------+--------+ PTA      121               0.74 biphasic          +---------+------------------+-----+---------+--------+ DP       151               0.93 triphasic         +---------+------------------+-----+---------+--------+ Theodoro Parma               0.69 Abnormal           +---------+------------------+-----+---------+--------+ +---------+------------------+-----+-------------------+-----------------------+ Left     Lt Pressure (mmHg)IndexWaveform           Comment                 +---------+------------------+-----+-------------------+-----------------------+ Brachial 163                    triphasic                                  +---------+------------------+-----+-------------------+-----------------------+ PTA      66                0.40 dampened monophasic                        +---------+------------------+-----+-------------------+-----------------------+ DP       64                0.39 dampened monophasic                        +---------+------------------+-----+-------------------+-----------------------+ Great Toe                                          unable to obtain                                                           pressure due to                                                            decreased great toe                                                        amplitude               +---------+------------------+-----+-------------------+-----------------------+ +-------+-----------+-----------+------------+------------+ ABI/TBIToday's ABIToday's TBIPrevious ABIPrevious TBI +-------+-----------+-----------+------------+------------+ Right  0.93       0.69                                +-------+-----------+-----------+------------+------------+  Left   0.40                                           +-------+-----------+-----------+------------+------------+   Summary: Right: Resting right ankle-brachial index indicates mild right lower extremity arterial disease. The right toe-brachial index is abnormal. Left: Resting left ankle-brachial index indicates severe left lower extremity arterial disease. *See table(s) above for measurements and observations.  Electronically signed by Jamelle Haring on 04/09/2022 at 7:26:35 AM.    Final    ECHOCARDIOGRAM COMPLETE  Result Date: 04/08/2022    ECHOCARDIOGRAM REPORT   Patient Name:   Erin Jackson Date of Exam: 04/08/2022 Medical Rec #:  [id number]      Height:       62.5 in Accession #:    [id number]     Weight:       207.9 lb Date of Birth:  Jan 28, 1966      BSA:          1.955 m Patient Age:    78 years       BP:           198/108 mmHg Patient Gender: F              HR:           96 bpm. Exam Location:  Inpatient Procedure: 2D Echo, Cardiac Doppler and Color Doppler Indications:    R07.9* Chest pain, unspecified  History:        Patient has no prior history of Echocardiogram examinations.                 Risk Factors:Hypertension and Diabetes.  Sonographer:    Bernadene Person RDCS Referring Phys: [place]  1. Inferior Septal / apical hypokinesis . Left ventricular ejection fraction, by estimation, is 35 to 40%. The left ventricle has moderately decreased function. The left ventricle demonstrates regional wall motion abnormalities (see scoring diagram/findings for description). The left ventricular internal cavity size was mildly dilated. Left ventricular diastolic parameters were normal.  2. Right ventricular systolic function is normal. The right ventricular size is normal.  3. A small pericardial effusion is present. The pericardial effusion is posterior to the left ventricle and anterior to the right ventricle.  4. The mitral valve is abnormal. Trivial mitral valve regurgitation. No evidence of mitral stenosis.  5. The aortic valve was not well visualized. There is mild calcification of the aortic valve. There is mild thickening of the aortic valve. Aortic valve regurgitation is trivial. Aortic valve sclerosis is present, with no evidence of aortic valve stenosis.  6. The inferior vena cava is normal in size with greater than 50% respiratory variability, suggesting right atrial pressure of 3 mmHg. FINDINGS  Left Ventricle:  Inferior Septal / apical hypokinesis. Left ventricular ejection fraction, by estimation, is 35 to 40%. The left ventricle has moderately decreased function. The left ventricle demonstrates regional wall motion abnormalities. The left ventricular internal cavity size was mildly dilated. There is no left ventricular hypertrophy. Left ventricular diastolic parameters were normal. Right Ventricle: The right ventricular size is normal. No increase in right ventricular wall thickness. Right ventricular systolic function is normal. Left Atrium: Left atrial size was normal in size. Right Atrium: Right atrial size was normal in size. Pericardium: A small pericardial effusion is present. The pericardial effusion is posterior to the left ventricle and  anterior to the right ventricle. Mitral Valve: The mitral valve is abnormal. There is mild thickening of the mitral valve leaflet(s). There is mild calcification of the mitral valve leaflet(s). Mild mitral annular calcification. Trivial mitral valve regurgitation. No evidence of mitral valve stenosis. Tricuspid Valve: The tricuspid valve is normal in structure. Tricuspid valve regurgitation is trivial. No evidence of tricuspid stenosis. Aortic Valve: The aortic valve was not well visualized. There is mild calcification of the aortic valve. There is mild thickening of the aortic valve. Aortic valve regurgitation is trivial. Aortic regurgitation PHT measures 481 msec. Aortic valve sclerosis is present, with no evidence of aortic valve stenosis. Pulmonic Valve: The pulmonic valve was normal in structure. Pulmonic valve regurgitation is not visualized. No evidence of pulmonic stenosis. Aorta: The aortic root is normal in size and structure. Venous: The inferior vena cava is normal in size with greater than 50% respiratory variability, suggesting right atrial pressure of 3 mmHg. IAS/Shunts: No atrial level shunt detected by color flow Doppler.  LEFT VENTRICLE PLAX 2D LVIDd:          4.70 cm      Diastology LVIDs:         3.90 cm      LV e' medial:    5.93 cm/s LV PW:         1.10 cm      LV E/e' medial:  12.5 LV IVS:        1.00 cm      LV e' lateral:   6.68 cm/s LVOT diam:     2.00 cm      LV E/e' lateral: 11.1 LV SV:         60 LV SV Index:   31 LVOT Area:     3.14 cm  LV Volumes (MOD) LV vol d, MOD A2C: 126.0 ml LV vol d, MOD A4C: 93.4 ml LV vol s, MOD A2C: 75.2 ml LV vol s, MOD A4C: 48.9 ml LV SV MOD A2C:     50.8 ml LV SV MOD A4C:     93.4 ml LV SV MOD BP:      46.4 ml RIGHT VENTRICLE RV S prime:     11.90 cm/s TAPSE (M-mode): 1.7 cm LEFT ATRIUM             Index        RIGHT ATRIUM          Index LA diam:        2.70 cm 1.38 cm/m   RA Area:     9.28 cm LA Vol (A2C):   38.6 ml 19.74 ml/m  RA Volume:   17.80 ml 9.10 ml/m LA Vol (A4C):   31.5 ml 16.11 ml/m LA Biplane Vol: 36.0 ml 18.41 ml/m  AORTIC VALVE LVOT Vmax:         120.00 cm/s LVOT Vmean:        79.900 cm/s LVOT VTI:          0.190 m AI PHT:            481 msec AR Vena Contracta: 0.20 cm  AORTA Ao Root diam: 3.20 cm Ao Asc diam:  3.60 cm MITRAL VALVE MV Area (PHT): 5.13 cm    SHUNTS MV Decel Time: 148 msec    Systemic VTI:  0.19 m MV E velocity: 74.10 cm/s  Systemic Diam: 2.00 cm MV A velocity: 61.30 cm/s MV E/A ratio:  1.21 Jenkins Rouge MD Electronically signed by Jenkins Rouge  MD Signature Date/Time: 04/08/2022/1:51:06 PM    Final    ECHOCARDIOGRAM LIMITED BUBBLE STUDY  Result Date: 04/22/2022    ECHOCARDIOGRAM REPORT   Patient Name:   Erin Jackson Date of Exam: 04/22/2022 Medical Rec #:  [id number]      Height:       62.0 in Accession #:    [id number]     Weight:       192.2 lb Date of Birth:  [birth year]      BSA:          1.880 m Patient Age:    68 years       BP:           127/77 mmHg Patient Gender: F              HR:           94 bpm. Exam Location:  Inpatient Procedure: 2D Echo, Limited Echo, Cardiac Doppler, Color Doppler and [place]            Contrast Bubble Study Indications:    Stroke  History:        Patient has  prior history of Echocardiogram examinations, most                 recent 04/08/2022. CHF, CAD; Risk Factors:Hypertension, Diabetes                 and Current Smoker.  Sonographer:    Jyl Heinz Referring Phys: [id number] Hortencia Conradi MEIER IMPRESSIONS  1. Left ventricular ejection fraction, by estimation, is 40 to 45%. The left ventricle has mildly decreased function. The left ventricle demonstrates global hypokinesis. There is mild left ventricular hypertrophy. Left ventricular diastolic parameters are consistent with Grade II diastolic dysfunction (pseudonormalization).  2. Right ventricular systolic function is normal. The right ventricular size is normal.  3. The pericardial effusion is circumferential.  4. The mitral valve is normal in structure. No evidence of mitral valve regurgitation. No evidence of mitral stenosis.  5. The aortic valve is normal in structure. Aortic valve regurgitation is trivial. No aortic stenosis is present.  6. The inferior vena cava is normal in size with greater than 50% respiratory variability, suggesting right atrial pressure of 3 mmHg.  7. Agitated saline contrast bubble study was negative, with no evidence of any interatrial shunt. Comparison(s): Prior images reviewed side by side. The left ventricular function has improved. Conclusion(s)/Recommendation(s): No intracardiac source of embolism detected on this transthoracic study. Consider a transesophageal echocardiogram to exclude cardiac source of embolism if clinically indicated. FINDINGS  Left Ventricle: Left ventricular ejection fraction, by estimation, is 40 to 45%. The left ventricle has mildly decreased function. The left ventricle demonstrates global hypokinesis. The left ventricular internal cavity size was normal in size. There is  mild left ventricular hypertrophy. Left ventricular diastolic parameters are consistent with Grade II diastolic dysfunction (pseudonormalization). Right Ventricle: The right ventricular size  is normal. No increase in right ventricular wall thickness. Right ventricular systolic function is normal. Left Atrium: Left atrial size was normal in size. Right Atrium: Right atrial size was normal in size. Pericardium: Trivial pericardial effusion is present. The pericardial effusion is circumferential. Mitral Valve: The mitral valve is normal in structure. There is mild thickening of the mitral valve leaflet(s). There is mild calcification of the mitral valve leaflet(s). No evidence of mitral valve regurgitation. No evidence of mitral valve stenosis. Tricuspid Valve: The tricuspid valve is normal in structure. Tricuspid valve regurgitation is not  demonstrated. No evidence of tricuspid stenosis. Aortic Valve: The aortic valve is normal in structure. Aortic valve regurgitation is trivial. Aortic regurgitation PHT measures 470 msec. No aortic stenosis is present. Aortic valve peak gradient measures 14.4 mmHg. Pulmonic Valve: The pulmonic valve was normal in structure. Pulmonic valve regurgitation is not visualized. No evidence of pulmonic stenosis. Aorta: The aortic root is normal in size and structure. Venous: The inferior vena cava is normal in size with greater than 50% respiratory variability, suggesting right atrial pressure of 3 mmHg. IAS/Shunts: No atrial level shunt detected by color flow Doppler. Agitated saline contrast was given intravenously to evaluate for intracardiac shunting. Agitated saline contrast bubble study was negative, with no evidence of any interatrial shunt. There  is no evidence of a patent foramen ovale.  LEFT VENTRICLE PLAX 2D LVIDd:         4.70 cm      Diastology LVIDs:         3.50 cm      LV e' medial:    6.31 cm/s LV PW:         1.40 cm      LV E/e' medial:  14.7 LV IVS:        1.40 cm      LV e' lateral:   7.18 cm/s LVOT diam:     1.80 cm      LV E/e' lateral: 13.0 LV SV:         47 LV SV Index:   25 LVOT Area:     2.54 cm  LV Volumes (MOD) LV vol d, MOD A2C: 110.0 ml LV vol d,  MOD A4C: 128.0 ml LV vol s, MOD A2C: 69.1 ml LV vol s, MOD A4C: 80.3 ml LV SV MOD A2C:     40.9 ml LV SV MOD A4C:     128.0 ml LV SV MOD BP:      43.6 ml RIGHT VENTRICLE             IVC RV S prime:     11.40 cm/s  IVC diam: 1.50 cm TAPSE (M-mode): 2.0 cm LEFT ATRIUM         Index LA diam:    3.20 cm 1.70 cm/m  AORTIC VALVE AV Area (Vmax): 1.51 cm AV Vmax:        190.00 cm/s AV Peak Grad:   14.4 mmHg LVOT Vmax:      113.00 cm/s LVOT Vmean:     81.200 cm/s LVOT VTI:       0.184 m AI PHT:         470 msec  AORTA Ao Root diam: 2.70 cm Ao Asc diam:  3.00 cm MITRAL VALVE MV Area (PHT): 4.83 cm    SHUNTS MV Decel Time: 157 msec    Systemic VTI:  0.18 m MV E velocity: 93.00 cm/s  Systemic Diam: 1.80 cm MV A velocity: 73.30 cm/s MV E/A ratio:  1.27 Candee Furbish MD Electronically signed by Candee Furbish MD Signature Date/Time: 04/22/2022/1:20:57 PM    Final    CT Angio Chest/Abd/Pel for Dissection W and/or Wo Contrast  Result Date: 04/20/2022 CLINICAL DATA:  Epigastric discomfort with nausea, vomiting, and shortness of breath. Hypertensive. EXAM: CT ANGIOGRAPHY CHEST, ABDOMEN AND PELVIS TECHNIQUE: Non-contrast CT of the chest was initially obtained. Multidetector CT imaging through the chest, abdomen and pelvis was performed using the standard protocol during bolus administration of intravenous contrast. Multiplanar reconstructed images and MIPs were obtained and reviewed to  evaluate the vascular anatomy. RADIATION DOSE REDUCTION: This exam was performed according to the departmental dose-optimization program which includes automated exposure control, adjustment of the mA and/or kV according to patient size and/or use of iterative reconstruction technique. CONTRAST:  152mL OMNIPAQUE IOHEXOL 350 MG/ML SOLN COMPARISON:  CT chest, abdomen, and pelvis dated April 08, 2022. FINDINGS: CTA CHEST FINDINGS Cardiovascular: Preferential opacification of the thoracic aorta. No evidence of thoracic aortic aneurysm or dissection. Normal  heart size. Unchanged small pericardial effusion. No central pulmonary embolism. Mediastinum/Nodes: No enlarged mediastinal, hilar, or axillary lymph nodes. Thyroid gland, trachea, and esophagus demonstrate no significant findings. Lungs/Pleura: Minimal subsegmental atelectasis at both lung bases. No focal consolidation, pleural effusion, or pneumothorax. Musculoskeletal: Chronic left breast seroma is unchanged, presumably postsurgical given history of prior lumpectomy. No acute or significant osseous findings. Review of the MIP images confirms the above findings. CTA ABDOMEN AND PELVIS FINDINGS VASCULAR Aorta: Normal caliber aorta without aneurysm, dissection, vasculitis or significant stenosis. Unchanged calcified and noncalcified atherosclerotic plaque. Celiac: Patent with similar mild stenosis of the proximal artery. No evidence of aneurysm, dissection, or vasculitis. SMA: Patent without evidence of aneurysm, dissection, vasculitis or significant stenosis. Renals: Unchanged severe stenosis of the right renal artery origin. The left renal artery is unremarkable. IMA: Occluded. 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 Veins: No obvious venous abnormality within the limitations of this arterial phase study. Review of the MIP images confirms the above findings. NON-VASCULAR Hepatobiliary: No focal liver abnormality is seen. No gallstones, gallbladder wall thickening, or biliary dilatation. Pancreas: Unremarkable. No pancreatic ductal dilatation or surrounding inflammatory changes. Spleen: Normal in size without focal abnormality. Adrenals/Urinary Tract: Adrenal glands are unremarkable. Similar patchy cortical hypodensity in the upper pole of the right kidney with increasing small amount of perinephric fluid. The left kidney is unremarkable. No hydronephrosis. Bladder is unremarkable. Stomach/Bowel: Unchanged small hiatal hernia. The stomach is otherwise within normal  limits. No bowel wall thickening, distention, or surrounding inflammatory changes. Normal appendix. Lymphatic: No enlarged abdominal or pelvic lymph nodes. Reproductive: Status post hysterectomy. No adnexal masses. Other: No abdominal wall hernia or abnormality. No abdominopelvic ascites. No pneumoperitoneum. Musculoskeletal: No acute or significant osseous findings. Chronic bilateral sacroiliitis. Review of the MIP images confirms the above findings. IMPRESSION: 1. No evidence of acute aortic syndrome or thoracoabdominal aortic aneurysm. 2. Similar patchy cortical hypodensity in the upper pole of the right kidney with increasing small amount of perinephric fluid, concerning for pyelonephritis versus infarct. 3. Unchanged severe stenosis of the right renal artery origin. 4. Chronically occluded IMA, left internal iliac artery, and proximal left SFA. 5. Unchanged small pericardial effusion. Electronically Signed   By: Titus Dubin M.D.   On: 04/20/2022 17:39   CT Angio Chest/Abd/Pel for Dissection W and/or Wo Contrast  Result Date: 04/08/2022 CLINICAL DATA:  Chest pain and back pain. EXAM: CT ANGIOGRAPHY CHEST, ABDOMEN AND PELVIS TECHNIQUE: Non-contrast CT of the chest was initially obtained. Multidetector CT imaging through the chest, abdomen and pelvis was performed using the standard protocol during bolus administration of intravenous contrast. Multiplanar reconstructed images and MIPs were obtained and reviewed to evaluate the vascular anatomy. RADIATION DOSE REDUCTION: This exam was performed according to the departmental dose-optimization program which includes automated exposure control, adjustment of the mA and/or kV according to patient size and/or use of iterative reconstruction technique. CONTRAST:  171mL OMNIPAQUE IOHEXOL 350 MG/ML SOLN COMPARISON:  None. FINDINGS: CTA CHEST FINDINGS Cardiovascular: Preferential opacification of the thoracic aorta. No  evidence of thoracic aortic aneurysm or  dissection. Normal heart size. There is a small pericardial effusion. Mediastinum/Nodes: No enlarged mediastinal, hilar, or axillary lymph nodes. Thyroid gland, trachea, and esophagus demonstrate no significant findings. Lungs/Pleura: There is minimal atelectasis in the inferior left upper lobe. The lungs are otherwise clear. No pleural effusion or pneumothorax identified. Musculoskeletal: No acute fractures are identified. There is an ill-defined low attenuation collection in the left breast measuring approximately 4.0 x 7.4 x 3.0 cm. There is no surrounding enhancement. Review of the MIP images confirms the above findings. CTA ABDOMEN AND PELVIS FINDINGS VASCULAR Aorta: Normal caliber aorta without aneurysm, dissection, vasculitis or significant stenosis. There is severe atherosclerotic disease throughout the aorta. There ulcerated plaques in the proximal abdominal aorta. There is also calcified atherosclerotic disease. Celiac: Patent without evidence of aneurysm, dissection, vasculitis or significant stenosis. SMA: Patent without evidence of aneurysm, dissection, vasculitis or significant stenosis. Renals: Severe focal stenosis origin right renal artery. Left renal artery within normal limits. IMA: Not seen. Inflow: Patent without evidence of aneurysm, dissection, vasculitis or significant stenosis. There is occlusion of the visualized proximal left femoral artery. Veins: No obvious venous abnormality within the limitations of this arterial phase study. Review of the MIP images confirms the above findings. NON-VASCULAR Hepatobiliary: No focal liver abnormality is seen. No gallstones, gallbladder wall thickening, or biliary dilatation. Pancreas: Unremarkable. No pancreatic ductal dilatation or surrounding inflammatory changes. Spleen: Normal in size without focal abnormality. Adrenals/Urinary Tract: There is some patchy areas of cortical hypodensity in the superior pole the right kidney. There is no hydronephrosis  or perinephric fluid. Adrenal glands and bladder are within normal limits. Stomach/Bowel: Stomach is within normal limits. Appendix appears normal. No evidence of bowel wall thickening, distention, or inflammatory changes. Lymphatic: No enlarged lymph nodes are seen. Reproductive: Status post hysterectomy. No adnexal masses. Other: No abdominal wall hernia or abnormality. No abdominopelvic ascites. Musculoskeletal: No acute or significant osseous findings. Review of the MIP images confirms the above findings. IMPRESSION: 1. No evidence for aortic dissection or aneurysm. 2. Severe atherosclerotic disease of the abdominal aorta with noncalcified plaque and ulcerated plaque. 3. 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 4. Severe focal stenosis origin of the right renal artery. 5. IMA not visualized. 6. Patchy hypodensity superior pole the right kidney, indeterminate. Findings may represent pyelonephritis or infarct. Other focal lesion can not be excluded. Consider follow-up evaluation with dedicated renal CT or MRI. 7. Small pericardial effusion. 8. Low-density collection in the left breast, indeterminate. Findings may be related to fluid collection or resolving hematoma. Recommend clinical correlation. Consider ultrasound. Electronically Signed   By: Ronney Asters M.D.   On: 04/08/2022 00:53   VAS US CAROTID  Result Date: 04/23/2022 Carotid Arterial Duplex Study Patient Name:  Erin Jackson  Date of Exam:   04/23/2022 Medical Rec #: [id number]       Accession #:    [id number] Date of Birth: 12-10-1966       Patient Gender: F Patient Age:   66 years Exam Location:  [facility] Procedure:      VAS US CAROTID Referring Phys: Cornelius Moras Celine Dishman --------------------------------------------------------------------------------  Indications:       CVA. Risk Factors:      Hypertension, Diabetes. Comparison Study:  no prior Performing Technologist: Archie Patten RVS  Examination Guidelines: A complete  evaluation includes B-mode imaging, spectral Doppler, color Doppler, and power Doppler as needed of all accessible portions of each vessel. Bilateral testing is considered an  integral part of a complete examination. Limited examinations for reoccurring indications may be performed as noted.  Right Carotid Findings: +----------+--------+--------+--------+-------------------------+--------+           PSV cm/sEDV cm/sStenosisPlaque Description       Comments +----------+--------+--------+--------+-------------------------+--------+ CCA Prox  84      17              heterogenous                      +----------+--------+--------+--------+-------------------------+--------+ CCA Distal93      19              heterogenous                      +----------+--------+--------+--------+-------------------------+--------+ ICA Prox  112     35      1-39%   heterogenous and calcific         +----------+--------+--------+--------+-------------------------+--------+ ICA Distal134     38                                                +----------+--------+--------+--------+-------------------------+--------+ ECA       116     15                                                +----------+--------+--------+--------+-------------------------+--------+ +----------+--------+-------+--------+-------------------+           PSV cm/sEDV cmsDescribeArm Pressure (mmHG) +----------+--------+-------+--------+-------------------+ [id number]                                        +----------+--------+-------+--------+-------------------+ +---------+--------+--+--------+--+---------+ VertebralPSV cm/s82EDV cm/s23Antegrade +---------+--------+--+--------+--+---------+  Left Carotid Findings: +----------+--------+--------+--------+-------------------------+--------+           PSV cm/sEDV cm/sStenosisPlaque Description       Comments  +----------+--------+--------+--------+-------------------------+--------+ CCA Prox  117     27              heterogenous                      +----------+--------+--------+--------+-------------------------+--------+ CCA Distal91      29              heterogenous                      +----------+--------+--------+--------+-------------------------+--------+ ICA Prox  119     33      1-39%   heterogenous and calcific         +----------+--------+--------+--------+-------------------------+--------+ ICA Distal78      22                                                +----------+--------+--------+--------+-------------------------+--------+ ECA       207     31                                                +----------+--------+--------+--------+-------------------------+--------+ +----------+--------+--------+--------+-------------------+  PSV cm/sEDV cm/sDescribeArm Pressure (mmHG) +----------+--------+--------+--------+-------------------+ Subclavian120                                         +----------+--------+--------+--------+-------------------+ +---------+--------+--+--------+-+---------+ VertebralPSV cm/s28EDV cm/s9Antegrade +---------+--------+--+--------+-+---------+   Summary: Right Carotid: Velocities in the right ICA are consistent with a 1-39% stenosis. Left Carotid: Velocities in the left ICA are consistent with a 1-39% stenosis. Vertebrals: Bilateral vertebral arteries demonstrate antegrade flow. *See table(s) above for measurements and observations.  Electronically signed by Antony Contras MD on 04/23/2022 at 4:30:27 PM.    Final    VAS [place] LOWER EXTREMITY VENOUS (DVT)  Result Date: 04/09/2022  Lower Venous DVT Study Patient Name:  Erin Jackson  Date of Exam:   04/09/2022 Medical Rec #: [id number]       Accession #:    [id number] Date of Birth: February 10, 1966       Patient Gender: F Patient Age:   56 years Exam Location:  [facility]  Procedure:      VAS [place] LOWER EXTREMITY VENOUS (DVT) Referring Phys: DAWOOD ELGERGAWY --------------------------------------------------------------------------------  Indications: D-dimer 1.87.  Comparison Study: No prior venous studies. Left femoral artery occlusion on CTA Performing Technologist: Darlin Coco RDMS, RVT  Examination Guidelines: A complete evaluation includes B-mode imaging, spectral Doppler, color Doppler, and power Doppler as needed of all accessible portions of each vessel. Bilateral testing is considered an integral part of a complete examination. Limited examinations for reoccurring indications may be performed as noted. The reflux portion of the exam is performed with the patient in reverse Trendelenburg.  +---------+---------------+---------+-----------+----------+--------------+ RIGHT    CompressibilityPhasicitySpontaneityPropertiesThrombus Aging +---------+---------------+---------+-----------+----------+--------------+ CFV      Full           Yes      Yes                                 +---------+---------------+---------+-----------+----------+--------------+ SFJ      Full                                                        +---------+---------------+---------+-----------+----------+--------------+ FV Prox  Full                                                        +---------+---------------+---------+-----------+----------+--------------+ FV Mid   Full                                                        +---------+---------------+---------+-----------+----------+--------------+ FV DistalFull                                                        +---------+---------------+---------+-----------+----------+--------------+ PFV      Full                                                        +---------+---------------+---------+-----------+----------+--------------+  POP      Full           Yes      Yes                                  +---------+---------------+---------+-----------+----------+--------------+ PTV      Full                                                        +---------+---------------+---------+-----------+----------+--------------+ PERO     Full                                                        +---------+---------------+---------+-----------+----------+--------------+ Gastroc  Full                                                        +---------+---------------+---------+-----------+----------+--------------+   +---------+---------------+---------+-----------+----------+--------------+ LEFT     CompressibilityPhasicitySpontaneityPropertiesThrombus Aging +---------+---------------+---------+-----------+----------+--------------+ CFV      Full           Yes      Yes                                 +---------+---------------+---------+-----------+----------+--------------+ SFJ      Full                                                        +---------+---------------+---------+-----------+----------+--------------+ FV Prox  Full                                                        +---------+---------------+---------+-----------+----------+--------------+ FV Mid   Full                                                        +---------+---------------+---------+-----------+----------+--------------+ FV DistalFull                                                        +---------+---------------+---------+-----------+----------+--------------+ PFV      Full                                                        +---------+---------------+---------+-----------+----------+--------------+  POP      Full           Yes      Yes                                 +---------+---------------+---------+-----------+----------+--------------+ PTV      Full                                                         +---------+---------------+---------+-----------+----------+--------------+ PERO     Full                                                        +---------+---------------+---------+-----------+----------+--------------+ Gastroc  Full                                                        +---------+---------------+---------+-----------+----------+--------------+     Summary: RIGHT: - There is no evidence of deep vein thrombosis in the lower extremity.  - No cystic structure found in the popliteal fossa.  LEFT: - There is no evidence of deep vein thrombosis in the lower extremity.  - No cystic structure found in the popliteal fossa.  *See table(s) above for measurements and observations. Electronically signed by Monica Martinez MD on 04/09/2022 at 8:47:30 PM.    Final      PHYSICAL EXAM  Temp:  [98.1 F (36.7 C)-99 F (37.2 C)] 98.1 F (36.7 C) (05/04 1601) Pulse Rate:  [86-119] 117 (05/04 1625) Resp:  [15-27] 18 (05/04 1625) BP: (89-157)/(74-99) 156/99 (05/04 1625) SpO2:  [86 %-99 %] 99 % (05/04 1625) Weight:  [89.4 kg] 89.4 kg (05/04 0500)  General - Well nourished, well developed, drowsy sleepy.  Ophthalmologic - fundi not visualized due to noncooperation.  Cardiovascular - Regular rhythm and rate.  Neuro - awake alert, orientated to age, place, people and time. No aphasia, fluent language, following all simple commands. Able to name and repeat. No gaze palsy, tracking bilaterally, visual field full, PERRL. No facial droop. Tongue midline.  Right upper and lower extremity 5/5, left upper extremity painful movement, and lower extremity giveaway weakness and lack of effort.  Right facial and left upper and lower extremity decreased light touch sensation, right FTN intact, gait not tested.     ASSESSMENT/PLAN Ms. ALEXZANDRIA OBERLE is a 55 y.o. female with history of hypertension, diabetes, chest pain status post cardiac cath, mild cardiomyopathy, smoker, LBP admitted for nausea  vomiting, chest pain, shortness of breath, headache, left facial numbness, dizziness for several days. No tPA given due to outside window.    Stroke:  right pontine infarct and punctate left hippocampus and frontal white matter infarcts, secondary to small vessel disease vs. cardioembolic source CT no acute abnormality MRI  right pontine infarct and punctate left hippocampus and frontal white matter infarcts MRA intracranial stenosis moderate left M2 stenosis Carotid Doppler unremarkable 2D Echo EF 40 to 45%, no PFO EEG normal, no seizure  Recommend 30-day CardioNet monitoring to rule out A-fib LDL 102 HgbA1c 6.8 UDS positive for THC Heparin subcu for VTE prophylaxis clopidogrel 75 mg daily prior to admission, now on clopidogrel 75 mg daily.  Patient has allergy to aspirin. Ongoing aggressive stroke risk factor management Therapy recommendations: CIR Disposition: Pending  Cardiomyopathy 4/18 presented with chest pain status post cardiac cath showed no significant coronary artery stenosis 4/19 EF 35 to 40% This admission EF 40 to 45%  PVD CTA aorta severe atherosclerosis abdominal aorta R.  Occlusion of left femoral artery, left SFA, IMA, left internal iliac artery, severe stenosis right renal artery  Diabetes HgbA1c 6.8 goal < 7.0 Controlled CBG monitoring SSI DM education and close PCP follow up  Hypertensive urgency  BP fluctuate Off Cardene Avoid low BP Long term BP goal normotensive  Hyperlipidemia Home meds: Lipitor 80 LDL 102, goal < 70 Now on Lipitor 80 and Zetia 10 Continue statin at discharge  Tobacco abuse Current smoker Smoking cessation counseling provided Pt is willing to quit  Other Stroke Risk Factors Obesity, Body mass index is 36.05 kg/m.  THC addiction Coronary artery disease  Other Active Problems AKI, creatinine 1.32-> 2.17-> 1.46 Leukocytosis WBC 11.6  Hospital day # 2  Neurology will sign off. Please call with questions. Pt will  follow up with stroke clinic NP at [facility] in about 4 weeks. Thanks for the consult.  Rosalin Hawking, MD PhD Stroke Neurology 04/23/2022 5:52 PM    To contact Stroke Continuity provider, please refer to [email or web address] After hours, contact General Neurology

## 2022-04-23 NOTE — Progress Notes (Signed)
Carotid duplex has been completed.  ? ?Preliminary results in CV Proc.  ? ?Erin Jackson Erin Jackson ?04/23/2022 11:43 AM    ?

## 2022-04-23 NOTE — Progress Notes (Signed)
Inpatient Rehab Admissions Coordinator:  ? ?I met with patient and her family at the bedside to discuss CIR recommendations goals/expectations.  We reviewed 3 hrs/day, average length of stay 2 weeks (depending on progress), physician follow up and goals of supervision level.  Pt and family report that they would have to hire caregivers to cover some supervision but that they are willing to do that.  I do not think she will need 24/7 long term.  I reviewed insurance authorization process and I will start that request today.  ? ?Shann Medal, PT, DPT ?Admissions Coordinator ?[phone] ?04/23/22  ?1:26 PM ? ?

## 2022-04-23 NOTE — Progress Notes (Signed)
Speech Language Pathology Treatment: Dysphagia  ?Patient Details ?Name: Erin Jackson ?MRN: [id number] ?DOB: 01/15/1966 ?Today's Date: 04/23/2022 ?Time: 0937-0951 ?SLP Time Calculation (min) (ACUTE ONLY): 14 min ? ?Assessment / Plan / Recommendation ?Clinical Impression ? Pt's overall affect and swallow ability has improved from yesterday. She was pleasant and responsive consuming straw sips thin without s/sx aspiration. Her mastication of upgraded regular texture was functional and timely. Swallow/respiratory pattern normal and recommend she upgrade to regular and continue thin liquids, pills with thin, straws and no further ST needed.  ?  ?HPI HPI: Patient is a 56 y.o. female with PMH: HTN, DM, anxiety, GERD who presented to the hospital with left side facial numbness, dizziness when walking, n/v x 2 weeks, SOB when vomiting, HA, abdominal pain and dysuria. In ED, initial BP was 150/114 and patient reported in ED that she did not take her BP meds morning of admission. CT head negative for acute intracranial abnormality, MRI brain showed small acute or subacute infarcts in left frontal white matter, left hippocampus and right pons; mild edema without mass effect. She passed [facility] with RN, however MD concerned with patient appearing hypovolemic from days of poor PO intake and n/v and requested SLP eval of swallow function. ?  ?   ?SLP Plan ? All goals met;Discharge SLP treatment due to (comment) ? ?  ?  ?Recommendations for follow up therapy are one component of a multi-disciplinary discharge planning process, led by the attending physician.  Recommendations may be updated based on patient status, additional functional criteria and insurance authorization. ?  ? ?Recommendations  ?Diet recommendations: Regular;Thin liquid ?Liquids provided via: Cup;Straw ?Medication Administration: Whole meds with liquid ?Supervision: Patient able to self feed ?Compensations: Slow rate;Small sips/bites;Minimize environmental  distractions ?Postural Changes and/or Swallow Maneuvers: Seated upright 90 degrees  ?   ?    ?   ? ? ? ? Oral Care Recommendations: Oral care BID ?Follow Up Recommendations: No SLP follow up ?Assistance recommended at discharge: None ?SLP Visit Diagnosis: Dysphagia, unspecified (R13.10) ?Plan: All goals met;Discharge SLP treatment due to (comment) ? ? ? ? ?  ?  ? ? ?Litaker, Lisa Willis ? ?04/23/2022, 10:04 AM ?

## 2022-04-23 NOTE — Progress Notes (Signed)
PROGRESS NOTE    Erin Jackson  FMM:[id number] DOB: Aug 10, 1966 DOA: 04/20/2022 PCP: Raymon Mutton., FNP   Chief Complaint  Patient presents with   Shortness of Breath   Nausea   Weakness   Emesis    Brief Narrative:   56 y.o. female with history of hypertension, diabetes mellitus, anxiety, hyperlipidemia, CAD, hyperlipidemia, chronic systolic CHF with EF 35 to 40%, left femoral artery occlusion/PVD, atherosclerosis, tobacco abuse, hypertension, diabetes mellitus type 2, chronic pain syndrome, patient presents to ED secondary to left face numbness, dizziness, work-up significant for multifocal acute CVA, and hypertensive emergency on admission with blood pressure 239/16, she was admitted to ICU, for nicardipine drip, blood pressures currently controlled, transferred off ICU, she was transferred to [facility] 04/23/2022.   Assessment & Plan:   Principal Problem:   Hypertensive urgency Active Problems:   Cerebral embolism with cerebral infarction  Acute/Subacute L frontal, hippocampus, and R pons infarcts - right pontine infarct and punctate left hippocampus and frontal white matter infarcts, secondary to small vessel disease vs. cardioembolic source  - MRI  right pontine infarct and punctate left hippocampus and frontal white matter infarcts - 2D Echo EF 40 to 45%, no PFO -EEG normal, no seizures -Neurology recommends 30 days CardioNet monitoring to rule out A-fib -LDL of 102-hemoglobin A1c 6.8 -UDS positive for THC-currently on Plavix 75 mg oral daily( has aspirin allergy) -Seen By PT/OT, recommendation for CIR  Hypertensive encephalopathy -Did require  nicardipine drip initially -Encephalopathy has resolved, mentation back to baseline -Allow for permissive hypertension    Acute on chronic kidney disease -Creatinine peaked at 2.17, currently back to baseline at 1.4  Type II diabetes mellitus -A1c 6.8 -Continue with insulin sliding scale  Hypomagnesemia Mg has  normalized.    Chronic heart failure with reduced ejection fraction (EF 35-40%) Non-obstructive coronary artery disease Does not appear hypervolemic, holding home GDMT, can resume once clinically improved. Will need to monitor volume status daily.   Hyperlipidemia -LDL is 102, she is on Lipitor 80 mg at home, started on Zetia as well    Peripheral vascular disease  During most recent hospitalization found to have severe arterial disease in L, mild disease in R. Per chart review was having claudication symptoms. Vascular consulted at that time, no role for emergent revascularization. Will re-start home meds. - Plavix 75mg  daily - Atorvastatin 80mg  daily     Hypokalemia - repleted  DVT prophylaxis: Heparin Code Status: Full Family Communication: Son at bedside Disposition: CIR  Status is: Inpatient    Consultants:  PCCM Neurology  Subjective:  She denies any chest pain, shortness of breath, she reports left facial tingling/numbness  Objective: Vitals:   04/22/22 2321 04/23/22 0308 04/23/22 0500 04/23/22 1208  BP: (!) 157/87 126/74  (!) 134/98  Pulse: 91 97  (!) 107  Resp: 17 15  20   Temp: 98.1 F (36.7 C) 98.3 F (36.8 C)  99 F (37.2 C)  TempSrc: Oral Oral  Oral  SpO2: 96% 92%  97%  Weight:   89.4 kg     Intake/Output Summary (Last 24 hours) at 04/23/2022 1512 Last data filed at 04/23/2022 0800 Gross per 24 hour  Intake 1257.31 ml  Output 250 ml  Net 1007.31 ml   Filed Weights   04/21/22 0500 04/22/22 0500 04/23/22 0500  Weight: 85.5 kg 87.2 kg 89.4 kg    Examination:  Awake Alert, Oriented X 3, No new F.N deficits, Normal affect Symmetrical Chest wall movement, Good air  movement bilaterally, CTAB RRR,No Gallops,Rubs or new Murmurs, No Parasternal Heave +ve B.Sounds, Abd Soft, No tenderness, No rebound - guarding or rigidity. No Cyanosis, Clubbing or edema, No new Rash or bruise       Data Reviewed: I have personally reviewed following labs and  imaging studies  CBC: Recent Labs  Lab 04/20/22 1207 04/20/22 1249 04/21/22 0343 04/22/22 0144  WBC 10.3  --  17.7* 11.6*  NEUTROABS 6.9  --   --   --   HGB 13.1 14.6 14.1 10.7*  HCT 40.6 43.0 42.9 32.7*  MCV 91.2  --  86.8 88.9  PLT 276  --  354 0000000    Basic Metabolic Panel: Recent Labs  Lab 04/20/22 1207 04/20/22 1249 04/21/22 0226 04/21/22 1238 04/22/22 0144 04/23/22 0154  NA 138 139 138 139 139 137  K 3.5 3.7 3.7 3.8 3.0* 3.4*  CL 101 100 101 104 100 102  CO2 24  --  17* 15* 28 28  GLUCOSE 200* 207* 322* 343* 124* 140*  BUN 14 16 20  21* 25* 16  CREATININE 1.28* 1.10* 1.32* 1.46* 2.17* 1.46*  CALCIUM 7.5*  --  7.2* 7.5* 7.0* 7.6*  MG  --   --  <0.5* <0.5* 3.3* 2.6*  PHOS  --   --  5.1*  --  5.4*  --     GFR: Estimated Creatinine Clearance: 43.6 mL/min (A) (by C-G formula based on SCr of 1.46 mg/dL (H)).  Liver Function Tests: Recent Labs  Lab 04/20/22 1207  AST 19  ALT 13  ALKPHOS 65  BILITOT 0.4  PROT 7.7  ALBUMIN 3.1*    CBG: Recent Labs  Lab 04/22/22 1923 04/22/22 2325 04/23/22 0353 04/23/22 0752 04/23/22 1206  GLUCAP 131* 129* 103* 200* 236*     Recent Results (from the past 240 hour(s))  Blood culture (routine x 2)     Status: None (Preliminary result)   Collection Time: 04/20/22  8:50 PM   Specimen: BLOOD LEFT FOREARM  Result Value Ref Range Status   Specimen Description BLOOD LEFT FOREARM  Final   Special Requests   Final    BOTTLES DRAWN AEROBIC ONLY Blood Culture results may not be optimal due to an inadequate volume of blood received in culture bottles   Culture   Final    NO GROWTH 3 DAYS Performed at [facility], [place] [address], [place], [place] [address]    Report Status PENDING  Incomplete  Blood culture (routine x 2)     Status: None (Preliminary result)   Collection Time: 04/21/22 12:47 AM   Specimen: BLOOD  Result Value Ref Range Status   Specimen Description BLOOD SITE NOT SPECIFIED  Final   Special  Requests   Final    BOTTLES DRAWN AEROBIC AND ANAEROBIC Blood Culture adequate volume   Culture   Final    NO GROWTH 2 DAYS Performed at [facility], [address] [place], [place] [address]    Report Status PENDING  Incomplete  Resp Panel by RT-PCR (Flu A&B, Covid) Nasopharyngeal Swab     Status: None   Collection Time: 04/21/22 12:50 AM   Specimen: Nasopharyngeal Swab; Nasopharyngeal(NP) swabs in vial transport medium  Result Value Ref Range Status   SARS Coronavirus 2 by RT PCR NEGATIVE NEGATIVE Final    Comment: (NOTE) SARS-CoV-2 target nucleic acids are NOT DETECTED.  The SARS-CoV-2 RNA is generally detectable in upper respiratory specimens during the acute phase of infection. The  lowest concentration of SARS-CoV-2 viral copies this assay can detect is 138 copies/mL. A negative result does not preclude SARS-Cov-2 infection and should not be used as the sole basis for treatment or other patient management decisions. A negative result may occur with  improper specimen collection/handling, submission of specimen other than nasopharyngeal swab, presence of viral mutation(s) within the areas targeted by this assay, and inadequate number of viral copies(<138 copies/mL). A negative result must be combined with clinical observations, patient history, and epidemiological information. The expected result is Negative.  Fact Sheet for Patients:  EntrepreneurPulse.com.au  Fact Sheet for Healthcare Providers:  IncredibleEmployment.be  This test is no t yet approved or cleared by the [place] FDA and  has been authorized for detection and/or diagnosis of SARS-CoV-2 by FDA under an Emergency Use Authorization (EUA). This EUA will remain  in effect (meaning this test can be used) for the duration of the COVID-19 declaration under Section 564(b)(1) of the Act, 21 U.S.C.section 360bbb-3(b)(1), unless the authorization is terminated  or  revoked sooner.       Influenza A by PCR NEGATIVE NEGATIVE Final   Influenza B by PCR NEGATIVE NEGATIVE Final    Comment: (NOTE) The Xpert Xpress SARS-CoV-2/FLU/RSV plus assay is intended as an aid in the diagnosis of influenza from Nasopharyngeal swab specimens and should not be used as a sole basis for treatment. Nasal washings and aspirates are unacceptable for Xpert Xpress SARS-CoV-2/FLU/RSV testing.  Fact Sheet for Patients: EntrepreneurPulse.com.au  Fact Sheet for Healthcare Providers: IncredibleEmployment.be  This test is not yet approved or cleared by the [place] FDA and has been authorized for detection and/or diagnosis of SARS-CoV-2 by FDA under an Emergency Use Authorization (EUA). This EUA will remain in effect (meaning this test can be used) for the duration of the COVID-19 declaration under Section 564(b)(1) of the Act, 21 U.S.C. section 360bbb-3(b)(1), unless the authorization is terminated or revoked.  Performed at [facility], [place] [address] [place], [place] [address]   MRSA Next Gen by PCR, Nasal     Status: None   Collection Time: 04/21/22  3:28 AM   Specimen: Nasal Mucosa; Nasal Swab  Result Value Ref Range Status   MRSA by PCR Next Gen NOT DETECTED NOT DETECTED Final    Comment: (NOTE) The GeneXpert MRSA Assay (FDA approved for NASAL specimens only), is one component of a comprehensive MRSA colonization surveillance program. It is not intended to diagnose MRSA infection nor to guide or monitor treatment for MRSA infections. Test performance is not FDA approved in patients less than 26 years old. Performed at [facility], [place] [address] [place] [address] [place], [place] [address]          Radiology Studies: MR ANGIO HEAD WO CONTRAST  Result Date: 04/22/2022 CLINICAL DATA:  Provided history: Stroke, follow-up. EXAM: MRA HEAD WITHOUT CONTRAST TECHNIQUE: Angiographic images of the Circle of Willis were  acquired using MRA technique without intravenous contrast. COMPARISON:  Brain MRI 04/21/2022. FINDINGS: Anterior circulation: The intracranial internal carotid arteries are patent. Atherosclerotic irregularity of both vessels with no more than mild stenosis. The M1 middle cerebral arteries are patent. No M2 proximal branch occlusion is identified. Atherosclerotic irregularity of the M2 and more distal MCA vessels, bilaterally. Most notably, there is a moderate stenosis within a superior division mid-to-distal M2 left MCA vessel. The anterior cerebral arteries are patent. No intracranial aneurysm is identified. Posterior circulation: The intracranial vertebral arteries are patent. The right vertebral artery is dominant. The  basilar artery is patent. The posterior cerebral arteries are patent. Mild atherosclerotic irregularity of both vessels. Sizable posterior communicating arteries with developmentally hypoplastic P1 segments, bilaterally. Anatomic variants: As described. IMPRESSION: No intracranial large vessel occlusion is identified. Intracranial atherosclerotic disease, as described. Most notably, there is a moderate focal stenosis within a superior division mid-to-distal M2 left MCA vessel. Electronically Signed   By: Kellie Simmering D.O.   On: 04/22/2022 15:43   MR BRAIN WO CONTRAST  Result Date: 04/21/2022 CLINICAL DATA:  Mental status change, unknown cause EXAM: MRI HEAD WITHOUT CONTRAST TECHNIQUE: Multiplanar, multiecho pulse sequences of the brain and surrounding structures were obtained without intravenous contrast. COMPARISON:  CT head Apr 20, 2022.  MRI head November 12, 2020. FINDINGS: Brain: Small acute or subacute infarcts in the left frontal white matter and left hippocampus. Mild edema without mass effect. No evidence of acute hemorrhage, mass lesion, midline shift, hydrocephalus, or extra-axial fluid collection. Additional mild T2/FLAIR hyperintensities within the white matter, nonspecific but  compatible with chronic microvascular ischemic disease. Partially empty sella. Vascular: Major arterial flow voids are maintained at the skull base. Skull and upper cervical spine: Normal marrow signal. Sinuses/Orbits: Clear sinuses.  No acute orbital findings. Other: No mastoid effusions. IMPRESSION: 1. Small acute or subacute infarcts in the left frontal white matter, left hippocampus, and right pons. Mild edema without mass effect. Given involvement of multiple vascular territories, consider embolic etiology. 2. Mild chronic microvascular ischemic disease. 3. Partially empty sella, which is often a normal anatomic variant but can be associated with idiopathic intracranial hypertension. Electronically Signed   By: Margaretha Sheffield M.D.   On: 04/21/2022 16:34   DG Chest Port 1 View  Result Date: 04/22/2022 CLINICAL DATA:  Respiratory failure. EXAM: PORTABLE CHEST 1 VIEW COMPARISON:  04/20/2022 and CT chest 04/20/2022. FINDINGS: Trachea is midline. Heart size stable. Increasing bibasilar streaky and patchy airspace opacification. There may be a small left pleural effusion. IMPRESSION: 1. Increasing bibasilar atelectasis. Difficult to exclude developing pneumonia in the right lung base. 2. Possible small left pleural effusion. Electronically Signed   By: Lorin Picket M.D.   On: 04/22/2022 08:34   ECHOCARDIOGRAM LIMITED BUBBLE STUDY  Result Date: 04/22/2022    ECHOCARDIOGRAM REPORT   Patient Name:   Erin Jackson Date of Exam: 04/22/2022 Medical Rec #:  [id number]      Height:       62.0 in Accession #:    [id number]     Weight:       192.2 lb Date of Birth:  [birth year]      BSA:          1.880 m Patient Age:    25 years       BP:           127/77 mmHg Patient Gender: F              HR:           94 bpm. Exam Location:  Inpatient Procedure: 2D Echo, Limited Echo, Cardiac Doppler, Color Doppler and Saline            Contrast Bubble Study Indications:    Stroke  History:        Patient has prior history of  Echocardiogram examinations, most                 recent 04/08/2022. CHF, CAD; Risk Factors:Hypertension, Diabetes  and Current Smoker.  Sonographer:    Jyl Heinz Referring Phys: [id number] Hortencia Conradi MEIER IMPRESSIONS  1. Left ventricular ejection fraction, by estimation, is 40 to 45%. The left ventricle has mildly decreased function. The left ventricle demonstrates global hypokinesis. There is mild left ventricular hypertrophy. Left ventricular diastolic parameters are consistent with Grade II diastolic dysfunction (pseudonormalization).  2. Right ventricular systolic function is normal. The right ventricular size is normal.  3. The pericardial effusion is circumferential.  4. The mitral valve is normal in structure. No evidence of mitral valve regurgitation. No evidence of mitral stenosis.  5. The aortic valve is normal in structure. Aortic valve regurgitation is trivial. No aortic stenosis is present.  6. The inferior vena cava is normal in size with greater than 50% respiratory variability, suggesting right atrial pressure of 3 mmHg.  7. Agitated saline contrast bubble study was negative, with no evidence of any interatrial shunt. Comparison(s): Prior images reviewed side by side. The left ventricular function has improved. Conclusion(s)/Recommendation(s): No intracardiac source of embolism detected on this transthoracic study. Consider a transesophageal echocardiogram to exclude cardiac source of embolism if clinically indicated. FINDINGS  Left Ventricle: Left ventricular ejection fraction, by estimation, is 40 to 45%. The left ventricle has mildly decreased function. The left ventricle demonstrates global hypokinesis. The left ventricular internal cavity size was normal in size. There is  mild left ventricular hypertrophy. Left ventricular diastolic parameters are consistent with Grade II diastolic dysfunction (pseudonormalization). Right Ventricle: The right ventricular size is normal. No  increase in right ventricular wall thickness. Right ventricular systolic function is normal. Left Atrium: Left atrial size was normal in size. Right Atrium: Right atrial size was normal in size. Pericardium: Trivial pericardial effusion is present. The pericardial effusion is circumferential. Mitral Valve: The mitral valve is normal in structure. There is mild thickening of the mitral valve leaflet(s). There is mild calcification of the mitral valve leaflet(s). No evidence of mitral valve regurgitation. No evidence of mitral valve stenosis. Tricuspid Valve: The tricuspid valve is normal in structure. Tricuspid valve regurgitation is not demonstrated. No evidence of tricuspid stenosis. Aortic Valve: The aortic valve is normal in structure. Aortic valve regurgitation is trivial. Aortic regurgitation PHT measures 470 msec. No aortic stenosis is present. Aortic valve peak gradient measures 14.4 mmHg. Pulmonic Valve: The pulmonic valve was normal in structure. Pulmonic valve regurgitation is not visualized. No evidence of pulmonic stenosis. Aorta: The aortic root is normal in size and structure. Venous: The inferior vena cava is normal in size with greater than 50% respiratory variability, suggesting right atrial pressure of 3 mmHg. IAS/Shunts: No atrial level shunt detected by color flow Doppler. Agitated saline contrast was given intravenously to evaluate for intracardiac shunting. Agitated saline contrast bubble study was negative, with no evidence of any interatrial shunt. There  is no evidence of a patent foramen ovale.  LEFT VENTRICLE PLAX 2D LVIDd:         4.70 cm      Diastology LVIDs:         3.50 cm      LV e' medial:    6.31 cm/s LV PW:         1.40 cm      LV E/e' medial:  14.7 LV IVS:        1.40 cm      LV e' lateral:   7.18 cm/s LVOT diam:     1.80 cm      LV E/e' lateral: 13.0 LV  SV:         47 LV SV Index:   25 LVOT Area:     2.54 cm  LV Volumes (MOD) LV vol d, MOD A2C: 110.0 ml LV vol d, MOD A4C: 128.0  ml LV vol s, MOD A2C: 69.1 ml LV vol s, MOD A4C: 80.3 ml LV SV MOD A2C:     40.9 ml LV SV MOD A4C:     128.0 ml LV SV MOD BP:      43.6 ml RIGHT VENTRICLE             IVC RV S prime:     11.40 cm/s  IVC diam: 1.50 cm TAPSE (M-mode): 2.0 cm LEFT ATRIUM         Index LA diam:    3.20 cm 1.70 cm/m  AORTIC VALVE AV Area (Vmax): 1.51 cm AV Vmax:        190.00 cm/s AV Peak Grad:   14.4 mmHg LVOT Vmax:      113.00 cm/s LVOT Vmean:     81.200 cm/s LVOT VTI:       0.184 m AI PHT:         470 msec  AORTA Ao Root diam: 2.70 cm Ao Asc diam:  3.00 cm MITRAL VALVE MV Area (PHT): 4.83 cm    SHUNTS MV Decel Time: 157 msec    Systemic VTI:  0.18 m MV E velocity: 93.00 cm/s  Systemic Diam: 1.80 cm MV A velocity: 73.30 cm/s MV E/A ratio:  1.27 Candee Furbish MD Electronically signed by Candee Furbish MD Signature Date/Time: 04/22/2022/1:20:57 PM    Final    VAS US CAROTID  Result Date: 04/23/2022 Carotid Arterial Duplex Study Patient Name:  Erin Jackson  Date of Exam:   04/23/2022 Medical Rec #: [id number]       Accession #:    [id number] Date of Birth: May 09, 1966       Patient Gender: F Patient Age:   70 years Exam Location:  [facility] Procedure:      VAS US CAROTID Referring Phys: Cornelius Moras XU --------------------------------------------------------------------------------  Indications:       CVA. Risk Factors:      Hypertension, Diabetes. Comparison Study:  no prior Performing Technologist: Archie Patten RVS  Examination Guidelines: A complete evaluation includes B-mode imaging, spectral Doppler, color Doppler, and power Doppler as needed of all accessible portions of each vessel. Bilateral testing is considered an integral part of a complete examination. Limited examinations for reoccurring indications may be performed as noted.  Right Carotid Findings: +----------+--------+--------+--------+-------------------------+--------+           PSV cm/sEDV cm/sStenosisPlaque Description       Comments  +----------+--------+--------+--------+-------------------------+--------+ CCA Prox  84      17              heterogenous                      +----------+--------+--------+--------+-------------------------+--------+ CCA Distal93      19              heterogenous                      +----------+--------+--------+--------+-------------------------+--------+ ICA Prox  112     35      1-39%   heterogenous and calcific         +----------+--------+--------+--------+-------------------------+--------+ ICA Distal134     38                                                +----------+--------+--------+--------+-------------------------+--------+  ECA       116     15                                                +----------+--------+--------+--------+-------------------------+--------+ +----------+--------+-------+--------+-------------------+           PSV cm/sEDV cmsDescribeArm Pressure (mmHG) +----------+--------+-------+--------+-------------------+ [id number]                                        +----------+--------+-------+--------+-------------------+ +---------+--------+--+--------+--+---------+ VertebralPSV cm/s82EDV cm/s23Antegrade +---------+--------+--+--------+--+---------+  Left Carotid Findings: +----------+--------+--------+--------+-------------------------+--------+           PSV cm/sEDV cm/sStenosisPlaque Description       Comments +----------+--------+--------+--------+-------------------------+--------+ CCA Prox  117     27              heterogenous                      +----------+--------+--------+--------+-------------------------+--------+ CCA Distal91      29              heterogenous                      +----------+--------+--------+--------+-------------------------+--------+ ICA Prox  119     33      1-39%   heterogenous and calcific          +----------+--------+--------+--------+-------------------------+--------+ ICA Distal78      22                                                +----------+--------+--------+--------+-------------------------+--------+ ECA       207     31                                                +----------+--------+--------+--------+-------------------------+--------+ +----------+--------+--------+--------+-------------------+           PSV cm/sEDV cm/sDescribeArm Pressure (mmHG) +----------+--------+--------+--------+-------------------+ Subclavian120                                         +----------+--------+--------+--------+-------------------+ +---------+--------+--+--------+-+---------+ VertebralPSV cm/s28EDV cm/s9Antegrade +---------+--------+--+--------+-+---------+   Summary: Right Carotid: Velocities in the right ICA are consistent with a 1-39% stenosis. Left Carotid: Velocities in the left ICA are consistent with a 1-39% stenosis. Vertebrals: Bilateral vertebral arteries demonstrate antegrade flow. *See table(s) above for measurements and observations.     Preliminary         Scheduled Meds:  atorvastatin  80 mg Oral Daily   clopidogrel  75 mg Oral Daily   ezetimibe  10 mg Oral Daily   heparin  5,000 Units Subcutaneous Q8H   insulin aspart  0-15 Units Subcutaneous Q4H   potassium chloride  40 mEq Oral Q4H   traZODone  150 mg Oral QHS   Continuous Infusions:  sodium chloride Stopped (04/22/22 1406)   promethazine (PHENERGAN) injection (IM or IVPB) Stopped (04/22/22 1355)     LOS: 2 days  Phillips Climes, MD [facility]   To contact the attending provider between 7A-7P or the covering provider during after hours 7P-7A, please log into the web site [email or web address] and access using universal  password for that web site. If you do not have the password, please call the hospital operator.  04/23/2022, 3:12 PM

## 2022-04-24 DIAGNOSIS — I6340 Cerebral infarction due to embolism of unspecified cerebral artery: Secondary | ICD-10-CM | POA: Diagnosis not present

## 2022-04-24 DIAGNOSIS — I160 Hypertensive urgency: Secondary | ICD-10-CM | POA: Diagnosis not present

## 2022-04-24 LAB — BASIC METABOLIC PANEL
Anion gap: 6 (ref 5–15)
BUN: 13 mg/dL (ref 6–20)
CO2: 24 mmol/L (ref 22–32)
Calcium: 8.7 mg/dL — ABNORMAL LOW (ref 8.9–10.3)
Chloride: 107 mmol/L (ref 98–111)
Creatinine, Ser: 1.29 mg/dL — ABNORMAL HIGH (ref 0.44–1.00)
GFR, Estimated: 48 mL/min — ABNORMAL LOW (ref >60)
Glucose, Bld: 123 mg/dL — ABNORMAL HIGH (ref 70–99)
Potassium: 4.8 mmol/L (ref 3.5–5.1)
Sodium: 137 mmol/L (ref 135–145)

## 2022-04-24 LAB — GLUCOSE, CAPILLARY
Glucose-Capillary: 136 mg/dL — ABNORMAL HIGH (ref 70–99)
Glucose-Capillary: 154 mg/dL — ABNORMAL HIGH (ref 70–99)
Glucose-Capillary: 155 mg/dL — ABNORMAL HIGH (ref 70–99)
Glucose-Capillary: 170 mg/dL — ABNORMAL HIGH (ref 70–99)
Glucose-Capillary: 181 mg/dL — ABNORMAL HIGH (ref 70–99)
Glucose-Capillary: 308 mg/dL — ABNORMAL HIGH (ref 70–99)

## 2022-04-24 MED ORDER — DAPAGLIFLOZIN PROPANEDIOL 5 MG PO TABS
5.0000 mg | ORAL_TABLET | Freq: Every day | ORAL | Status: DC
  Administered 2022-04-24 – 2022-04-29 (×6): 5 mg via ORAL
  Filled 2022-04-24 (×6): qty 1

## 2022-04-24 MED ORDER — METOPROLOL TARTRATE 25 MG PO TABS
25.0000 mg | ORAL_TABLET | Freq: Two times a day (BID) | ORAL | Status: DC
  Administered 2022-04-24 – 2022-04-25 (×3): 25 mg via ORAL
  Filled 2022-04-24 (×4): qty 1

## 2022-04-24 MED ORDER — HYDRALAZINE HCL 20 MG/ML IJ SOLN
10.0000 mg | Freq: Four times a day (QID) | INTRAMUSCULAR | Status: DC | PRN
Start: 2022-04-24 — End: 2022-04-29

## 2022-04-24 MED ORDER — PANTOPRAZOLE SODIUM 40 MG PO TBEC
40.0000 mg | DELAYED_RELEASE_TABLET | Freq: Every day | ORAL | Status: DC
  Administered 2022-04-24 – 2022-04-29 (×6): 40 mg via ORAL
  Filled 2022-04-24 (×6): qty 1

## 2022-04-24 MED ORDER — ALUM & MAG HYDROXIDE-SIMETH 200-200-20 MG/5ML PO SUSP
30.0000 mL | Freq: Four times a day (QID) | ORAL | Status: DC | PRN
  Administered 2022-04-24 – 2022-04-25 (×2): 30 mL via ORAL
  Filled 2022-04-24 (×2): qty 30

## 2022-04-24 NOTE — Progress Notes (Signed)
PROGRESS NOTE    Erin Jackson  [id number] DOB: 1966-06-10 DOA: 04/20/2022 PCP: Sonia Side., FNP   Chief Complaint  Patient presents with   Shortness of Breath   Nausea   Weakness   Emesis    Brief Narrative:   56 y.o. female with history of hypertension, diabetes mellitus, anxiety, hyperlipidemia, CAD, hyperlipidemia, chronic systolic CHF with EF 35 to 40%, left femoral artery occlusion/PVD, atherosclerosis, tobacco abuse, hypertension, diabetes mellitus type 2, chronic pain syndrome, patient presents to ED secondary to left face numbness, dizziness, work-up significant for multifocal acute CVA, and hypertensive emergency on admission with blood pressure 239/16, she was admitted to ICU, for nicardipine drip, blood pressures currently controlled, transferred off ICU, she was transferred to [facility] 04/23/2022.   Assessment & Plan:   Principal Problem:   Hypertensive urgency Active Problems:   Cerebral embolism with cerebral infarction  Acute/Subacute L frontal, hippocampus, and R pons infarcts - right pontine infarct and punctate left hippocampus and frontal white matter infarcts, secondary to small vessel disease vs. cardioembolic source  - MRI  right pontine infarct and punctate left hippocampus and frontal white matter infarcts - 2D Echo EF 40 to 45%, no PFO -EEG normal, no seizures -Neurology recommends 30 days CardioNet monitoring to rule out A-fib -LDL of 102-hemoglobin A1c 6.8 -UDS positive for THC-currently on Plavix 75 mg oral daily( has aspirin allergy) -Seen By PT/OT, recommendation for CIR  Hypertensive encephalopathy -Did require  nicardipine drip initially -Encephalopathy has resolved, mentation back to baseline -Allow for permissive hypertension -Blood pressure is elevated, she will be resumed antihypertensive regimen gradually, will start on metoprolol 25 mg p.o. twice daily.    Acute on chronic kidney disease -Creatinine peaked at 2.17,  currently back to baseline at 1.4  Type II diabetes mellitus -A1c 6.8 -Continue with insulin sliding scale -Did on Farxiga as well specially in the setting of CHF.  Hypomagnesemia Mg has normalized.    Chronic heart failure with reduced ejection fraction (EF 35-40%) Non-obstructive coronary artery disease Does not appear hypervolemic, holding home GDMT, can resume once clinically improved. Will need to monitor volume status daily.  -repeat EF this admission with improvement 40 to 45%, no PFO. -We will resume on low-dose metoprolol 25 mg p.o. twice daily today, will increase uptitrate dose gradually and resume back on losartan as well given her low EF keeping in mind gradual resumption of meds to allow for permissive hypertension with normal BP in 5 to 7 days -We will start on Farxiga  Hyperlipidemia -LDL is 102, she is on Lipitor 80 mg at home, started on Zetia as well    Peripheral vascular disease  During most recent hospitalization found to have severe arterial disease in L, mild disease in R. Per chart review was having claudication symptoms. Vascular consulted at that time, no role for emergent revascularization. Will re-start home meds. - Plavix 75mg  daily - Atorvastatin 80mg  daily     Hypokalemia - repleted  DVT prophylaxis: Heparin Code Status: Full Family Communication: none at bedside Disposition: CIR  Status is: Inpatient    Consultants:  PCCM Neurology  Subjective:  She denies any chest pain, shortness of breath, she reports left facial tingling/numbness  Objective: Vitals:   04/24/22 0337 04/24/22 0400 04/24/22 0819 04/24/22 1218  BP: (!) 180/104 (!) 166/110 (!) 153/100 (!) 161/88  Pulse: (!) 115  (!) 130 100  Resp:   (!) 23 (!) 22  Temp:   99.7 F (37.6 C) 98.6  F (37 C)  TempSrc:   Oral Oral  SpO2:    98%  Weight:        Intake/Output Summary (Last 24 hours) at 04/24/2022 1515 Last data filed at 04/23/2022 1922 Gross per 24 hour  Intake --   Output 1050 ml  Net -1050 ml   Filed Weights   04/21/22 0500 04/22/22 0500 04/23/22 0500  Weight: 85.5 kg 87.2 kg 89.4 kg    Examination:  Awake Alert, Oriented X 3, No new F.N deficits, Normal affect Symmetrical Chest wall movement, Good air movement bilaterally, CTAB RRR,No Gallops,Rubs or new Murmurs, No Parasternal Heave +ve B.Sounds, Abd Soft, No tenderness, No rebound - guarding or rigidity. No Cyanosis, Clubbing or edema, No new Rash or bruise       Data Reviewed: I have personally reviewed following labs and imaging studies  CBC: Recent Labs  Lab 04/20/22 1207 04/20/22 1249 04/21/22 0343 04/22/22 0144  WBC 10.3  --  17.7* 11.6*  NEUTROABS 6.9  --   --   --   HGB 13.1 14.6 14.1 10.7*  HCT 40.6 43.0 42.9 32.7*  MCV 91.2  --  86.8 88.9  PLT 276  --  354 0000000    Basic Metabolic Panel: Recent Labs  Lab 04/21/22 0226 04/21/22 1238 04/22/22 0144 04/23/22 0154 04/24/22 0157  NA 138 139 139 137 137  K 3.7 3.8 3.0* 3.4* 4.8  CL 101 104 100 102 107  CO2 17* 15* 28 28 24   GLUCOSE 322* 343* 124* 140* 123*  BUN 20 21* 25* 16 13  CREATININE 1.32* 1.46* 2.17* 1.46* 1.29*  CALCIUM 7.2* 7.5* 7.0* 7.6* 8.7*  MG <0.5* <0.5* 3.3* 2.6*  --   PHOS 5.1*  --  5.4*  --   --     GFR: Estimated Creatinine Clearance: 49.4 mL/min (A) (by C-G formula based on SCr of 1.29 mg/dL (H)).  Liver Function Tests: Recent Labs  Lab 04/20/22 1207  AST 19  ALT 13  ALKPHOS 65  BILITOT 0.4  PROT 7.7  ALBUMIN 3.1*    CBG: Recent Labs  Lab 04/23/22 2136 04/23/22 2321 04/24/22 0316 04/24/22 0817 04/24/22 1216  GLUCAP 179* 138* 154* 170* 136*     Recent Results (from the past 240 hour(s))  Blood culture (routine x 2)     Status: None (Preliminary result)   Collection Time: 04/20/22  8:50 PM   Specimen: BLOOD LEFT FOREARM  Result Value Ref Range Status   Specimen Description BLOOD LEFT FOREARM  Final   Special Requests   Final    BOTTLES DRAWN AEROBIC ONLY Blood  Culture results may not be optimal due to an inadequate volume of blood received in culture bottles   Culture   Final    NO GROWTH 4 DAYS Performed at [facility], [place] [address], [place], [place] [address]    Report Status PENDING  Incomplete  Blood culture (routine x 2)     Status: None (Preliminary result)   Collection Time: 04/21/22 12:47 AM   Specimen: BLOOD  Result Value Ref Range Status   Specimen Description BLOOD SITE NOT SPECIFIED  Final   Special Requests   Final    BOTTLES DRAWN AEROBIC AND ANAEROBIC Blood Culture adequate volume   Culture   Final    NO GROWTH 3 DAYS Performed at [facility], [address], [place], [place] [address]    Report Status PENDING  Incomplete  Resp Panel by RT-PCR (Flu  A&B, Covid) Nasopharyngeal Swab     Status: None   Collection Time: 04/21/22 12:50 AM   Specimen: Nasopharyngeal Swab; Nasopharyngeal(NP) swabs in vial transport medium  Result Value Ref Range Status   SARS Coronavirus 2 by RT PCR NEGATIVE NEGATIVE Final    Comment: (NOTE) SARS-CoV-2 target nucleic acids are NOT DETECTED.  The SARS-CoV-2 RNA is generally detectable in upper respiratory specimens during the acute phase of infection. The lowest concentration of SARS-CoV-2 viral copies this assay can detect is 138 copies/mL. A negative result does not preclude SARS-Cov-2 infection and should not be used as the sole basis for treatment or other patient management decisions. A negative result may occur with  improper specimen collection/handling, submission of specimen other than nasopharyngeal swab, presence of viral mutation(s) within the areas targeted by this assay, and inadequate number of viral copies(<138 copies/mL). A negative result must be combined with clinical observations, patient history, and epidemiological information. The expected result is Negative.  Fact Sheet for Patients:  EntrepreneurPulse.com.au  Fact Sheet for Healthcare  Providers:  IncredibleEmployment.be  This test is no t yet approved or cleared by the [place] FDA and  has been authorized for detection and/or diagnosis of SARS-CoV-2 by FDA under an Emergency Use Authorization (EUA). This EUA will remain  in effect (meaning this test can be used) for the duration of the COVID-19 declaration under Section 564(b)(1) of the Act, 21 U.S.C.section 360bbb-3(b)(1), unless the authorization is terminated  or revoked sooner.       Influenza A by PCR NEGATIVE NEGATIVE Final   Influenza B by PCR NEGATIVE NEGATIVE Final    Comment: (NOTE) The Xpert Xpress SARS-CoV-2/FLU/RSV plus assay is intended as an aid in the diagnosis of influenza from Nasopharyngeal swab specimens and should not be used as a sole basis for treatment. Nasal washings and aspirates are unacceptable for Xpert Xpress SARS-CoV-2/FLU/RSV testing.  Fact Sheet for Patients: EntrepreneurPulse.com.au  Fact Sheet for Healthcare Providers: IncredibleEmployment.be  This test is not yet approved or cleared by the [place] FDA and has been authorized for detection and/or diagnosis of SARS-CoV-2 by FDA under an Emergency Use Authorization (EUA). This EUA will remain in effect (meaning this test can be used) for the duration of the COVID-19 declaration under Section 564(b)(1) of the Act, 21 U.S.C. section 360bbb-3(b)(1), unless the authorization is terminated or revoked.  Performed at [facility], [place] [address], [place], [place] [address]   MRSA Next Gen by PCR, Nasal     Status: None   Collection Time: 04/21/22  3:28 AM   Specimen: Nasal Mucosa; Nasal Swab  Result Value Ref Range Status   MRSA by PCR Next Gen NOT DETECTED NOT DETECTED Final    Comment: (NOTE) The GeneXpert MRSA Assay (FDA approved for NASAL specimens only), is one component of a comprehensive MRSA colonization surveillance program. It is not intended to  diagnose MRSA infection nor to guide or monitor treatment for MRSA infections. Test performance is not FDA approved in patients less than 54 years old. Performed at [facility], [place] [address], [place], [place] [address]          Radiology Studies: MR ANGIO HEAD WO CONTRAST  Result Date: 04/22/2022 CLINICAL DATA:  Provided history: Stroke, follow-up. EXAM: MRA HEAD WITHOUT CONTRAST TECHNIQUE: Angiographic images of the Circle of Willis were acquired using MRA technique without intravenous contrast. COMPARISON:  Brain MRI 04/21/2022. FINDINGS: Anterior circulation: The intracranial internal carotid arteries are patent. Atherosclerotic irregularity of  both vessels with no more than mild stenosis. The M1 middle cerebral arteries are patent. No M2 proximal branch occlusion is identified. Atherosclerotic irregularity of the M2 and more distal MCA vessels, bilaterally. Most notably, there is a moderate stenosis within a superior division mid-to-distal M2 left MCA vessel. The anterior cerebral arteries are patent. No intracranial aneurysm is identified. Posterior circulation: The intracranial vertebral arteries are patent. The right vertebral artery is dominant. The basilar artery is patent. The posterior cerebral arteries are patent. Mild atherosclerotic irregularity of both vessels. Sizable posterior communicating arteries with developmentally hypoplastic P1 segments, bilaterally. Anatomic variants: As described. IMPRESSION: No intracranial large vessel occlusion is identified. Intracranial atherosclerotic disease, as described. Most notably, there is a moderate focal stenosis within a superior division mid-to-distal M2 left MCA vessel. Electronically Signed   By: Kellie Simmering D.O.   On: 04/22/2022 15:43   VAS US CAROTID  Result Date: 04/23/2022 Carotid Arterial Duplex Study Patient Name:  CARYL SIDELL  Date of Exam:   04/23/2022 Medical Rec #: [id number]       Accession #:    [id number] Date of  Birth: [birth year]       Patient Gender: F Patient Age:   52 years Exam Location:  [facility] Procedure:      VAS US CAROTID Referring Phys: Cornelius Moras XU --------------------------------------------------------------------------------  Indications:       CVA. Risk Factors:      Hypertension, Diabetes. Comparison Study:  no prior Performing Technologist: Archie Patten RVS  Examination Guidelines: A complete evaluation includes B-mode imaging, spectral Doppler, color Doppler, and power Doppler as needed of all accessible portions of each vessel. Bilateral testing is considered an integral part of a complete examination. Limited examinations for reoccurring indications may be performed as noted.  Right Carotid Findings: +----------+--------+--------+--------+-------------------------+--------+           PSV cm/sEDV cm/sStenosisPlaque Description       Comments +----------+--------+--------+--------+-------------------------+--------+ CCA Prox  84      17              heterogenous                      +----------+--------+--------+--------+-------------------------+--------+ CCA Distal93      19              heterogenous                      +----------+--------+--------+--------+-------------------------+--------+ ICA Prox  112     35      1-39%   heterogenous and calcific         +----------+--------+--------+--------+-------------------------+--------+ ICA Distal134     38                                                +----------+--------+--------+--------+-------------------------+--------+ ECA       116     15                                                +----------+--------+--------+--------+-------------------------+--------+ +----------+--------+-------+--------+-------------------+           PSV cm/sEDV cmsDescribeArm Pressure (mmHG) +----------+--------+-------+--------+-------------------+ [id number]                                         +----------+--------+-------+--------+-------------------+ +---------+--------+--+--------+--+---------+  VertebralPSV cm/s82EDV cm/s23Antegrade +---------+--------+--+--------+--+---------+  Left Carotid Findings: +----------+--------+--------+--------+-------------------------+--------+           PSV cm/sEDV cm/sStenosisPlaque Description       Comments +----------+--------+--------+--------+-------------------------+--------+ CCA Prox  117     27              heterogenous                      +----------+--------+--------+--------+-------------------------+--------+ CCA Distal91      29              heterogenous                      +----------+--------+--------+--------+-------------------------+--------+ ICA Prox  119     33      1-39%   heterogenous and calcific         +----------+--------+--------+--------+-------------------------+--------+ ICA Distal78      22                                                +----------+--------+--------+--------+-------------------------+--------+ ECA       207     31                                                +----------+--------+--------+--------+-------------------------+--------+ +----------+--------+--------+--------+-------------------+           PSV cm/sEDV cm/sDescribeArm Pressure (mmHG) +----------+--------+--------+--------+-------------------+ Subclavian120                                         +----------+--------+--------+--------+-------------------+ +---------+--------+--+--------+-+---------+ VertebralPSV cm/s28EDV cm/s9Antegrade +---------+--------+--+--------+-+---------+   Summary: Right Carotid: Velocities in the right ICA are consistent with a 1-39% stenosis. Left Carotid: Velocities in the left ICA are consistent with a 1-39% stenosis. Vertebrals: Bilateral vertebral arteries demonstrate antegrade flow. *See table(s) above for measurements and observations.  Electronically signed  by Antony Contras MD on 04/23/2022 at 4:30:27 PM.    Final         Scheduled Meds:  atorvastatin  80 mg Oral Daily   clopidogrel  75 mg Oral Daily   dapagliflozin propanediol  5 mg Oral Daily   ezetimibe  10 mg Oral Daily   heparin  5,000 Units Subcutaneous Q8H   insulin aspart  0-15 Units Subcutaneous Q4H   metoprolol tartrate  25 mg Oral BID   pantoprazole  40 mg Oral Daily   traZODone  150 mg Oral QHS   Continuous Infusions:  sodium chloride Stopped (04/22/22 1406)   promethazine (PHENERGAN) injection (IM or IVPB) Stopped (04/22/22 1355)     LOS: 3 days       Phillips Climes, MD [facility]   To contact the attending provider between 7A-7P or the covering provider during after hours 7P-7A, please log into the web site [email or web address] and access using universal [place] password for that web site. If you do not have the password, please call the hospital operator.  04/24/2022, 3:15 PM

## 2022-04-24 NOTE — Progress Notes (Signed)
Occupational Therapy Treatment ?Patient Details ?Name: Erin Jackson ?MRN: [id number] ?DOB: March 31, 1966 ?Today's Date: 04/24/2022 ? ? ?History of present illness Pt is a 56 y/o F presenting to [facility] on 5/2 with n/v and SOB. Found to have encephalopathy and seizure-like activity in the context of severe HTN. MRI shows small acute infarcts of left frontal white matter, hippocampus, and R pons. Pt in the ICU to monitor her tachycardia, HTN, and seizure activity.  CT head negative. PMH of HTN, DM, Anxiety, GERD, and recent heart cath on 4/20. ?  ?OT comments ? Pt with progress noted in ability to move L fingers, wrist and forearm with cues to visually monitor. Increased pain with touch and attempt to perform ROM of L elbow and shoulder. Completed bed mobility with increased time and moderate assistance. Pt with fair sitting balance once assisted to place L foot on floor. Transferred bed to chair with +2 max assistance with L knee blocked and bed pad under hips, minimal ability to bear weight on L LE. Pt with c/o headache, RN notified, and photophobia. Pt set up to self feed once in chair. Pt tearful at times. Continues to be an excellent inpatient rehab candidate.   ? ?Recommendations for follow up therapy are one component of a multi-disciplinary discharge planning process, led by the attending physician.  Recommendations may be updated based on patient status, additional functional criteria and insurance authorization. ?   ?Follow Up Recommendations ? Acute inpatient rehab (3hours/day)  ?  ?Assistance Recommended at Discharge Frequent or constant Supervision/Assistance  ?Patient can return home with the following ? Two people to help with walking and/or transfers;Two people to help with bathing/dressing/bathroom;Assistance with feeding;Assistance with cooking/housework;Direct supervision/assist for medications management;Direct supervision/assist for financial management;Assist for transportation;Help with stairs or ramp  for entrance ?  ?Equipment Recommendations ? BSC/3in1;Wheelchair (measurements OT);Wheelchair cushion (measurements OT)  ?  ?Recommendations for Other Services   ? ?  ?Precautions / Restrictions Precautions ?Precautions: Fall ?Restrictions ?Weight Bearing Restrictions: No  ? ? ?  ? ?Mobility Bed Mobility ?Overal bed mobility: Needs Assistance ?Bed Mobility: Supine to Sit ?  ?  ?Supine to sit: Mod assist ?  ?  ?General bed mobility comments: assist for LEs over EOB, to raise trunk and advance L hip to EOB ?  ? ?Transfers ?Overall transfer level: Needs assistance ?  ?Transfers: Sit to/from Stand ?Sit to Stand: +2 physical assistance, Max assist ?Stand pivot transfers: +2 physical assistance, Max assist ?  ?  ?  ?  ?General transfer comment: pt placing minimal weight through L LE, use of bed pad under hips ?  ?  ?Balance Overall balance assessment: Needs assistance ?  ?Sitting balance-Leahy Scale: Fair ?  ?  ?  ?Standing balance-Leahy Scale: Poor ?  ?  ?  ?  ?  ?  ?  ?  ?  ?  ?  ?  ?   ? ?ADL either performed or assessed with clinical judgement  ? ?ADL Overall ADL's : Needs assistance/impaired ?Eating/Feeding: Set up;Sitting ?Eating/Feeding Details (indicate cue type and reason): finger foods, cup with lid ?  ?  ?  ?  ?  ?  ?  ?  ?  ?  ?  ?  ?  ?  ?  ?  ?  ?  ?  ? ?Extremity/Trunk Assessment Upper Extremity Assessment ?Upper Extremity Assessment: LUE deficits/detail ?LUE Deficits / Details: moving fingers freely, 2+/5 wrist and forearm, increased pain in elbow and shoulder ?LUE: Shoulder pain  with ROM ?LUE Sensation: decreased light touch;decreased proprioception ?LUE Coordination: decreased fine motor;decreased gross motor ?  ?  ?  ?  ?  ? ?Vision   ?  ?  ?Perception   ?  ?Praxis   ?  ? ?Cognition Arousal/Alertness: Awake/alert ?Behavior During Therapy: Flat affect (tearful at time) ?Overall Cognitive Status: Impaired/Different from baseline ?Area of Impairment: Following commands, Problem solving ?  ?  ?  ?  ?  ?  ?   ?  ?  ?  ?  ?Following Commands: Follows one step commands with increased time ?  ?  ?Problem Solving: Slow processing, Decreased initiation, Requires verbal cues ?General Comments: pt tearful at times, encouraged pt by pointing out progress ?  ?  ?   ?Exercises   ? ?  ?Shoulder Instructions   ? ? ?  ?General Comments    ? ? ?Pertinent Vitals/ Pain       Pain Assessment ?Pain Assessment: Faces ?Faces Pain Scale: Hurts even more ?Pain Location: head, L UE with movement ?Pain Descriptors / Indicators: Grimacing, Guarding, Discomfort ?Pain Intervention(s): Monitored during session, Patient requesting pain meds-RN notified ? ?Home Living   ?  ?  ?  ?  ?  ?  ?  ?  ?  ?  ?  ?  ?  ?  ?  ?  ?  ?  ? ?  ?Prior Functioning/Environment    ?  ?  ?  ?   ? ?Frequency ? Min 2X/week  ? ? ? ? ?  ?Progress Toward Goals ? ?OT Goals(current goals can now be found in the care plan section) ? Progress towards OT goals: Progressing toward goals ? ?Acute Rehab OT Goals ?OT Goal Formulation: With patient ?Time For Goal Achievement: 05/06/22 ?Potential to Achieve Goals: Good  ?Plan Discharge plan remains appropriate   ? ?Co-evaluation ? ? ? PT/OT/SLP Co-Evaluation/Treatment: Yes ?Reason for Co-Treatment: For patient/therapist safety ?  ?OT goals addressed during session: Strengthening/ROM ?  ? ?  ?AM-PAC OT "6 Clicks" Daily Activity     ?Outcome Measure ? ? Help from another person eating meals?: A Little ?Help from another person taking care of personal grooming?: A Little ?Help from another person toileting, which includes using toliet, bedpan, or urinal?: Total ?Help from another person bathing (including washing, rinsing, drying)?: A Lot ?Help from another person to put on and taking off regular upper body clothing?: A Lot ?Help from another person to put on and taking off regular lower body clothing?: Total ?6 Click Score: 12 ? ?  ?End of Session   ? ?OT Visit Diagnosis: Unsteadiness on feet (R26.81);Other abnormalities of gait and  mobility (R26.89);Muscle weakness (generalized) (M62.81) ?  ?Activity Tolerance Patient tolerated treatment well ?  ?Patient Left in chair;with call bell/phone within reach;with chair alarm set ?  ?Nurse Communication Patient requests pain meds;Mobility status ?  ? ?   ? ?Time: 1336-1401 ?OT Time Calculation (min): 25 min ? ?Charges: OT General Charges ?$OT Visit: 1 Visit ?OT Treatments ?$Neuromuscular Re-education: 8-22 mins ? ?Martie Round, OTR/L ?Acute Rehabilitation Services ?Pager: [phone] ?Office: [phone]  ? ?Evern Bio ?04/24/2022, 2:35 PM ?

## 2022-04-24 NOTE — Progress Notes (Signed)
Pt refused midnight V/S from CNA. RN attempted V/S and pt refused.  ?

## 2022-04-24 NOTE — Progress Notes (Signed)
Inpatient Rehab Admissions Coordinator:  ? ?Awaiting determination from [facility] Medicare regarding CIR prior auth request.  Will continue to follow.  ? ?Estill Dooms, PT, DPT ?Admissions Coordinator ?[phone] ?04/24/22  ?9:59 AM ? ?

## 2022-04-24 NOTE — Progress Notes (Signed)
Physical Therapy Treatment ?Patient Details ?Name: Erin Jackson ?MRN: [id number] ?DOB: 1966-05-05 ?Today's Date: 04/24/2022 ? ? ?History of Present Illness Pt is a 56 y/o F presenting to [facility] on 5/2 with n/v and SOB. Found to have encephalopathy and seizure-like activity in the context of severe HTN. MRI shows small acute infarcts of left frontal white matter, hippocampus, and R pons. Pt in the ICU to monitor her tachycardia, HTN, and seizure activity.  CT head negative. PMH of HTN, DM, Anxiety, GERD, and recent heart cath on 4/20. ? ?  ?PT Comments  ? ? Pt was seen for progressing to a chair with two person help, notably requiring direct support for LUE, and supported L side with bed pad under hips to help her pivot on RLE.  Pt is in a fair amount of pain on LUE and with HA, and despite the dizziness, did not demonstrate a positional increase in the symptom.  HR was elevated but stable at 125.  Sitting in chair with LUE support and instructions for pt to return to bed in an hour or so depending on her tolerance.  Follow her for wb support of L side to gradually increase functional use and facilitate return of active movement.  Family is in and supportive of pt.  ?Recommendations for follow up therapy are one component of a multi-disciplinary discharge planning process, led by the attending physician.  Recommendations may be updated based on patient status, additional functional criteria and insurance authorization. ? ?Follow Up Recommendations ? Acute inpatient rehab (3hours/day) ?  ?  ?Assistance Recommended at Discharge Frequent or constant Supervision/Assistance  ?Patient can return home with the following A lot of help with bathing/dressing/bathroom;Assistance with cooking/housework;Direct supervision/assist for medications management;Direct supervision/assist for financial management;Assist for transportation;Help with stairs or ramp for entrance;Two people to help with walking and/or transfers ?  ?Equipment  Recommendations ? Rolling walker (2 wheels);BSC/3in1  ?  ?Recommendations for Other Services Rehab consult ? ? ?  ?Precautions / Restrictions Precautions ?Precautions: Fall ?Restrictions ?Weight Bearing Restrictions: No  ?  ? ?Mobility ? Bed Mobility ?Overal bed mobility: Needs Assistance ?Bed Mobility: Supine to Sit ?  ?  ?Supine to sit: Mod assist ?  ?  ?General bed mobility comments: assist legs and trunk with bed pad and pt performing scooting as well esp taking longer to move L hip out ?  ? ?Transfers ?Overall transfer level: Needs assistance ?  ?Transfers: Sit to/from Stand ?Sit to Stand: +2 physical assistance, Max assist ?Stand pivot transfers: +2 physical assistance, Max assist ?  ?  ?  ?  ?General transfer comment: tends to touch down on LLE only ?  ? ?Ambulation/Gait ?  ?  ?  ?  ?  ?  ?  ?General Gait Details: unable ? ? ?Stairs ?  ?  ?  ?  ?  ? ? ?Wheelchair Mobility ?  ? ?Modified Rankin (Stroke Patients Only) ?  ? ? ?  ?Balance Overall balance assessment: Needs assistance ?Sitting-balance support: Feet supported, Single extremity supported ?Sitting balance-Leahy Scale: Fair ?  ?  ?  ?Standing balance-Leahy Scale: Poor ?Standing balance comment: 2 person support to pivot safely with bed pad ?  ?  ?  ?  ?  ?  ?  ?  ?  ?  ?  ?  ? ?  ?Cognition Arousal/Alertness: Awake/alert ?Behavior During Therapy: Flat affect, Anxious (in tears) ?Overall Cognitive Status: Impaired/Different from baseline ?Area of Impairment: Problem solving, Awareness, Safety/judgement, Following commands, Attention ?  ?  ?  ?  ?  ?  ?  ?  ?  ?  Current Attention Level: Selective ?  ?Following Commands: Follows one step commands with increased time ?Safety/Judgement: Decreased awareness of safety, Decreased awareness of deficits ?Awareness: Intellectual, Emergent ?Problem Solving: Slow processing, Decreased initiation, Difficulty sequencing, Requires verbal cues, Requires tactile cues ?  ?  ?  ? ?  ?Exercises   ? ?  ?General Comments  General comments (skin integrity, edema, etc.): pt is worried about where she is functionally but could wb on RLE with BUE support of PT and OT to get to the chair using bed pad for support and safety ?  ?  ? ?Pertinent Vitals/Pain Pain Assessment ?Pain Assessment: Faces ?Faces Pain Scale: Hurts even more ?Pain Location: head, L UE with movement ?Pain Descriptors / Indicators: Grimacing, Guarding ?Pain Intervention(s): Limited activity within patient's tolerance, Monitored during session, Repositioned, Patient requesting pain meds-RN notified  ? ? ?Home Living   ?  ?  ?  ?  ?  ?  ?  ?  ?  ?   ?  ?Prior Function    ?  ?  ?   ? ?PT Goals (current goals can now be found in the care plan section) Acute Rehab PT Goals ?Patient Stated Goal: walk again ?Progress towards PT goals: Progressing toward goals ? ?  ?Frequency ? ? ? Min 3X/week ? ? ? ?  ?PT Plan Current plan remains appropriate  ? ? ?Co-evaluation PT/OT/SLP Co-Evaluation/Treatment: Yes ?Reason for Co-Treatment: For patient/therapist safety;To address functional/ADL transfers ?PT goals addressed during session: Mobility/safety with mobility;Balance ?OT goals addressed during session: Strengthening/ROM ?  ? ?  ?AM-PAC PT "6 Clicks" Mobility   ?Outcome Measure ? Help needed turning from your back to your side while in a flat bed without using bedrails?: A Lot ?Help needed moving from lying on your back to sitting on the side of a flat bed without using bedrails?: A Lot ?Help needed moving to and from a bed to a chair (including a wheelchair)?: A Lot ?Help needed standing up from a chair using your arms (e.g., wheelchair or bedside chair)?: Total ?Help needed to walk in hospital room?: Total ?Help needed climbing 3-5 steps with a railing? : Total ?6 Click Score: 9 ? ?  ?End of Session Equipment Utilized During Treatment: Gait belt ?Activity Tolerance: Treatment limited secondary to medical complications (Comment) (dizzy to sit or stand or get up to chair in equal  amounts) ?Patient left: in chair;with call bell/phone within reach;with chair alarm set ?Nurse Communication: Mobility status ?PT Visit Diagnosis: Unsteadiness on feet (R26.81);Dizziness and giddiness (R42);Muscle weakness (generalized) (M62.81) ?  ? ? ?Time: 1336-1401 ?PT Time Calculation (min) (ACUTE ONLY): 25 min ? ?Charges:  $Therapeutic Activity: 8-22 mins ?Ramond Dial ?04/24/2022, 5:03 PM ? ?Mee Hives, PT PhD ?Acute Rehab Dept. Number: [facility] [id number] and [place] [phone] ? ? ?

## 2022-04-24 NOTE — Care Management Important Message (Signed)
Important Message ? ?Patient Details  ?Name: Erin Jackson ?MRN: [id number] ?Date of Birth: May 27, 1966 ? ? ?Medicare Important Message Given:  Yes ? ? ? ? ?Crissy Mccreadie ?04/24/2022, 4:34 PM ?

## 2022-04-25 DIAGNOSIS — I639 Cerebral infarction, unspecified: Secondary | ICD-10-CM | POA: Diagnosis not present

## 2022-04-25 DIAGNOSIS — I160 Hypertensive urgency: Secondary | ICD-10-CM | POA: Diagnosis not present

## 2022-04-25 LAB — GLUCOSE, CAPILLARY
Glucose-Capillary: 168 mg/dL — ABNORMAL HIGH (ref 70–99)
Glucose-Capillary: 123 mg/dL — ABNORMAL HIGH (ref 70–99)
Glucose-Capillary: 275 mg/dL — ABNORMAL HIGH (ref 70–99)
Glucose-Capillary: 203 mg/dL — ABNORMAL HIGH (ref 70–99)

## 2022-04-25 LAB — CULTURE, BLOOD (ROUTINE X 2): Culture: NO GROWTH

## 2022-04-25 MED ORDER — DIPHENHYDRAMINE HCL 50 MG/ML IJ SOLN
12.5000 mg | Freq: Four times a day (QID) | INTRAMUSCULAR | Status: DC | PRN
  Administered 2022-04-25 – 2022-04-26 (×4): 12.5 mg via INTRAVENOUS
  Filled 2022-04-25 (×4): qty 1

## 2022-04-25 MED ORDER — METOPROLOL TARTRATE 50 MG PO TABS
50.0000 mg | ORAL_TABLET | Freq: Two times a day (BID) | ORAL | Status: DC
  Administered 2022-04-25 – 2022-04-27 (×4): 50 mg via ORAL
  Filled 2022-04-25 (×4): qty 1

## 2022-04-25 NOTE — Progress Notes (Signed)
PROGRESS NOTE    Erin Jackson  [id number] DOB: 06-Jun-1966 DOA: 04/20/2022 PCP: Sonia Side., FNP   Chief Complaint  Patient presents with   Shortness of Breath   Nausea   Weakness   Emesis    Brief Narrative:   56 y.o. female with history of hypertension, diabetes mellitus, anxiety, hyperlipidemia, CAD, hyperlipidemia, chronic systolic CHF with EF 35 to 40%, left femoral artery occlusion/PVD, atherosclerosis, tobacco abuse, hypertension, diabetes mellitus type 2, chronic pain syndrome, patient presents to ED secondary to left face numbness, dizziness, work-up significant for multifocal acute CVA, and hypertensive emergency on admission with blood pressure 239/16, she was admitted to ICU, for nicardipine drip, blood pressures currently controlled, transferred off ICU, she was transferred to [facility] 04/23/2022.   Assessment & Plan:   Principal Problem:   Hypertensive urgency Active Problems:   Cerebral embolism with cerebral infarction  Acute/Subacute L frontal, hippocampus, and R pons infarcts - right pontine infarct and punctate left hippocampus and frontal white matter infarcts, secondary to small vessel disease vs. cardioembolic source  - MRI  right pontine infarct and punctate left hippocampus and frontal white matter infarcts - 2D Echo EF 40 to 45%, no PFO -EEG normal, no seizures -Neurology recommends 30 days CardioNet monitoring to rule out A-fib -LDL of 102-hemoglobin A1c 6.8 -UDS positive for THC-currently on Plavix 75 mg oral daily( has aspirin allergy) -Seen By PT/OT, recommendation for CIR  Hypertensive encephalopathy -Did require  nicardipine drip initially -Encephalopathy has resolved, mentation back to baseline -Allow for permissive hypertension -Blood pressure is elevated, she will be resumed antihypertensive regimen gradually, will increase her metoprolol from 25mg  to 50 mg oral twice daily today, and will uptitrate gradually, and likely will add  losartan by tomorrow .   Acute on chronic kidney disease -Creatinine peaked at 2.17, currently back to baseline.  Type II diabetes mellitus -A1c 6.8 -Continue with insulin sliding scale -Did on Farxiga as well specially in the setting of CHF.  Hypomagnesemia Mg has normalized.    Chronic heart failure with reduced ejection fraction (EF 35-40%) Non-obstructive coronary artery disease Does not appear hypervolemic, holding home GDMT, can resume once clinically improved. Will need to monitor volume status daily.  -repeat EF this admission with improvement 40 to 45%, no PFO. -back on metoprolol , will increase uptitrate dose gradually and resume back on losartan as well given her low EF keeping in mind gradual resumption of meds to allow for permissive hypertension with normal BP in 5 to 7 days -started on Farxiga  Hyperlipidemia -LDL is 102, she is on Lipitor 80 mg at home, started on Zetia as well    Peripheral vascular disease  During most recent hospitalization found to have severe arterial disease in L, mild disease in R. Per chart review was having claudication symptoms. Vascular consulted at that time, no role for emergent revascularization. Will re-start home meds. - Plavix 75mg  daily - Atorvastatin 80mg  daily     Hypokalemia - repleted  DVT prophylaxis: Heparin Code Status: Full Family Communication: none at bedside Disposition: CIR  Status is: Inpatient, she is awaiting bed at CIR.    Consultants:  PCCM Neurology  Subjective:  No significant events overnight as discussed with staff, she does complain of itching, no dyspnea, no nausea or vomiting.  she is complaining of heartburn. Objective: Vitals:   04/25/22 0401 04/25/22 0834 04/25/22 0914 04/25/22 1153  BP: 133/78 (!) 69/56 (!) 180/113 (!) 175/134  Pulse: 100 91 100 100  Resp: 14 17 18 17   Temp: 98.5 F (36.9 C) 98.7 F (37.1 C) 98.7 F (37.1 C) 98.3 F (36.8 C)  TempSrc: Oral Oral Oral Oral  SpO2:  99%  99% 98%  Weight:        Intake/Output Summary (Last 24 hours) at 04/25/2022 1346 Last data filed at 04/25/2022 0842 Gross per 24 hour  Intake 480 ml  Output 1700 ml  Net -1220 ml   Filed Weights   04/22/22 0500 04/23/22 0500 04/25/22 0353  Weight: 87.2 kg 89.4 kg 89.2 kg    Examination:  Awake Alert, Oriented X 3, No new F.N deficits, Normal affect Symmetrical Chest wall movement, Good air movement bilaterally, CTAB RRR,No Gallops,Rubs or new Murmurs, No Parasternal Heave +ve B.Sounds, Abd Soft, No tenderness, No rebound - guarding or rigidity. No Cyanosis, Clubbing or edema, No new Rash or bruise        Data Reviewed: I have personally reviewed following labs and imaging studies  CBC: Recent Labs  Lab 04/20/22 1207 04/20/22 1249 04/21/22 0343 04/22/22 0144  WBC 10.3  --  17.7* 11.6*  NEUTROABS 6.9  --   --   --   HGB 13.1 14.6 14.1 10.7*  HCT 40.6 43.0 42.9 32.7*  MCV 91.2  --  86.8 88.9  PLT 276  --  354 0000000    Basic Metabolic Panel: Recent Labs  Lab 04/21/22 0226 04/21/22 1238 04/22/22 0144 04/23/22 0154 04/24/22 0157  NA 138 139 139 137 137  K 3.7 3.8 3.0* 3.4* 4.8  CL 101 104 100 102 107  CO2 17* 15* 28 28 24   GLUCOSE 322* 343* 124* 140* 123*  BUN 20 21* 25* 16 13  CREATININE 1.32* 1.46* 2.17* 1.46* 1.29*  CALCIUM 7.2* 7.5* 7.0* 7.6* 8.7*  MG <0.5* <0.5* 3.3* 2.6*  --   PHOS 5.1*  --  5.4*  --   --     GFR: Estimated Creatinine Clearance: 49.3 mL/min (A) (by C-G formula based on SCr of 1.29 mg/dL (H)).  Liver Function Tests: Recent Labs  Lab 04/20/22 1207  AST 19  ALT 13  ALKPHOS 65  BILITOT 0.4  PROT 7.7  ALBUMIN 3.1*    CBG: Recent Labs  Lab 04/24/22 1612 04/24/22 2009 04/24/22 2356 04/25/22 0344 04/25/22 1157  GLUCAP 308* 181* 155* 168* 123*     Recent Results (from the past 240 hour(s))  Blood culture (routine x 2)     Status: None   Collection Time: 04/20/22  8:50 PM   Specimen: BLOOD LEFT FOREARM  Result Value  Ref Range Status   Specimen Description BLOOD LEFT FOREARM  Final   Special Requests   Final    BOTTLES DRAWN AEROBIC ONLY Blood Culture results may not be optimal due to an inadequate volume of blood received in culture bottles   Culture   Final    NO GROWTH 5 DAYS Performed at [facility], [place] [address], [place], [place] [address]    Report Status 04/25/2022 FINAL  Final  Blood culture (routine x 2)     Status: None (Preliminary result)   Collection Time: 04/21/22 12:47 AM   Specimen: BLOOD  Result Value Ref Range Status   Specimen Description BLOOD SITE NOT SPECIFIED  Final   Special Requests   Final    BOTTLES DRAWN AEROBIC AND ANAEROBIC Blood Culture adequate volume   Culture   Final    NO GROWTH 4 DAYS Performed at [facility],  [address] [place], [place] [address]    Report Status PENDING  Incomplete  Resp Panel by RT-PCR (Flu A&B, Covid) Nasopharyngeal Swab     Status: None   Collection Time: 04/21/22 12:50 AM   Specimen: Nasopharyngeal Swab; Nasopharyngeal(NP) swabs in vial transport medium  Result Value Ref Range Status   SARS Coronavirus 2 by RT PCR NEGATIVE NEGATIVE Final    Comment: (NOTE) SARS-CoV-2 target nucleic acids are NOT DETECTED.  The SARS-CoV-2 RNA is generally detectable in upper respiratory specimens during the acute phase of infection. The lowest concentration of SARS-CoV-2 viral copies this assay can detect is 138 copies/mL. A negative result does not preclude SARS-Cov-2 infection and should not be used as the sole basis for treatment or other patient management decisions. A negative result may occur with  improper specimen collection/handling, submission of specimen other than nasopharyngeal swab, presence of viral mutation(s) within the areas targeted by this assay, and inadequate number of viral copies(<138 copies/mL). A negative result must be combined with clinical observations, patient history, and  epidemiological information. The expected result is Negative.  Fact Sheet for Patients:  EntrepreneurPulse.com.au  Fact Sheet for Healthcare Providers:  IncredibleEmployment.be  This test is no t yet approved or cleared by the [place] FDA and  has been authorized for detection and/or diagnosis of SARS-CoV-2 by FDA under an Emergency Use Authorization (EUA). This EUA will remain  in effect (meaning this test can be used) for the duration of the COVID-19 declaration under Section 564(b)(1) of the Act, 21 U.S.C.section 360bbb-3(b)(1), unless the authorization is terminated  or revoked sooner.       Influenza A by PCR NEGATIVE NEGATIVE Final   Influenza B by PCR NEGATIVE NEGATIVE Final    Comment: (NOTE) The Xpert Xpress SARS-CoV-2/FLU/RSV plus assay is intended as an aid in the diagnosis of influenza from Nasopharyngeal swab specimens and should not be used as a sole basis for treatment. Nasal washings and aspirates are unacceptable for Xpert Xpress SARS-CoV-2/FLU/RSV testing.  Fact Sheet for Patients: EntrepreneurPulse.com.au  Fact Sheet for Healthcare Providers: IncredibleEmployment.be  This test is not yet approved or cleared by the [place] FDA and has been authorized for detection and/or diagnosis of SARS-CoV-2 by FDA under an Emergency Use Authorization (EUA). This EUA will remain in effect (meaning this test can be used) for the duration of the COVID-19 declaration under Section 564(b)(1) of the Act, 21 U.S.C. section 360bbb-3(b)(1), unless the authorization is terminated or revoked.  Performed at [facility], [place] [address], [place], [place] [address]   MRSA Next Gen by PCR, Nasal     Status: None   Collection Time: 04/21/22  3:28 AM   Specimen: Nasal Mucosa; Nasal Swab  Result Value Ref Range Status   MRSA by PCR Next Gen NOT DETECTED NOT DETECTED Final    Comment:  (NOTE) The GeneXpert MRSA Assay (FDA approved for NASAL specimens only), is one component of a comprehensive MRSA colonization surveillance program. It is not intended to diagnose MRSA infection nor to guide or monitor treatment for MRSA infections. Test performance is not FDA approved in patients less than 32 years old. Performed at [facility], [place] [address], [place], [place] [address]          Radiology Studies: No results found.      Scheduled Meds:  atorvastatin  80 mg Oral Daily   clopidogrel  75 mg Oral Daily   dapagliflozin propanediol  5 mg Oral Daily  ezetimibe  10 mg Oral Daily   heparin  5,000 Units Subcutaneous Q8H   insulin aspart  0-15 Units Subcutaneous Q4H   metoprolol tartrate  50 mg Oral BID   pantoprazole  40 mg Oral Daily   traZODone  150 mg Oral QHS   Continuous Infusions:  sodium chloride Stopped (04/22/22 1406)   promethazine (PHENERGAN) injection (IM or IVPB) Stopped (04/22/22 1355)     LOS: 4 days       Phillips Climes, MD [facility]   To contact the attending provider between 7A-7P or the covering provider during after hours 7P-7A, please log into the web site [email or web address] and access using universal [place] password for that web site. If you do not have the password, please call the hospital operator.  04/25/2022, 1:46 PM

## 2022-04-26 DIAGNOSIS — I6340 Cerebral infarction due to embolism of unspecified cerebral artery: Secondary | ICD-10-CM | POA: Diagnosis not present

## 2022-04-26 DIAGNOSIS — I160 Hypertensive urgency: Secondary | ICD-10-CM | POA: Diagnosis not present

## 2022-04-26 LAB — RENAL FUNCTION PANEL
Albumin: 2.2 g/dL — ABNORMAL LOW (ref 3.5–5.0)
Anion gap: 8 (ref 5–15)
BUN: 22 mg/dL — ABNORMAL HIGH (ref 6–20)
CO2: 21 mmol/L — ABNORMAL LOW (ref 22–32)
Calcium: 8.7 mg/dL — ABNORMAL LOW (ref 8.9–10.3)
Chloride: 102 mmol/L (ref 98–111)
Creatinine, Ser: 1.7 mg/dL — ABNORMAL HIGH (ref 0.44–1.00)
GFR, Estimated: 35 mL/min — ABNORMAL LOW (ref >60)
Glucose, Bld: 123 mg/dL — ABNORMAL HIGH (ref 70–99)
Phosphorus: 3.1 mg/dL (ref 2.5–4.6)
Potassium: 4.6 mmol/L (ref 3.5–5.1)
Sodium: 131 mmol/L — ABNORMAL LOW (ref 135–145)

## 2022-04-26 LAB — GLUCOSE, CAPILLARY
Glucose-Capillary: 121 mg/dL — ABNORMAL HIGH (ref 70–99)
Glucose-Capillary: 132 mg/dL — ABNORMAL HIGH (ref 70–99)
Glucose-Capillary: 143 mg/dL — ABNORMAL HIGH (ref 70–99)
Glucose-Capillary: 162 mg/dL — ABNORMAL HIGH (ref 70–99)
Glucose-Capillary: 198 mg/dL — ABNORMAL HIGH (ref 70–99)
Glucose-Capillary: 203 mg/dL — ABNORMAL HIGH (ref 70–99)
Glucose-Capillary: 380 mg/dL — ABNORMAL HIGH (ref 70–99)

## 2022-04-26 LAB — CULTURE, BLOOD (ROUTINE X 2)
Culture: NO GROWTH
Special Requests: ADEQUATE

## 2022-04-26 LAB — TROPONIN I (HIGH SENSITIVITY): Troponin I (High Sensitivity): 10 ng/L (ref <18)

## 2022-04-26 LAB — D-DIMER, QUANTITATIVE: D-Dimer, Quant: 1.18 ug/mL-FEU — ABNORMAL HIGH (ref 0.00–0.50)

## 2022-04-26 MED ORDER — PREGABALIN 25 MG PO CAPS
50.0000 mg | ORAL_CAPSULE | Freq: Every day | ORAL | Status: DC
  Administered 2022-04-26 – 2022-04-29 (×4): 50 mg via ORAL
  Filled 2022-04-26 (×4): qty 2

## 2022-04-26 NOTE — Progress Notes (Signed)
PROGRESS NOTE    Bary RichardSharon S Lucarelli  XLK:[id number] DOB: 03/16/1966 DOA: 04/20/2022 PCP: Raymon MuttonSmith, Fred A Jr., FNP   Chief Complaint  Patient presents with   Shortness of Breath   Nausea   Weakness   Emesis    Brief Narrative:   56 y.o. female with history of hypertension, diabetes mellitus, anxiety, hyperlipidemia, CAD, hyperlipidemia, chronic systolic CHF with EF 35 to 40%, left femoral artery occlusion/PVD, atherosclerosis, tobacco abuse, hypertension, diabetes mellitus type 2, chronic pain syndrome, patient presents to ED secondary to left face numbness, dizziness, work-up significant for multifocal acute CVA, and hypertensive emergency on admission with blood pressure 239/16, she was admitted to ICU, for nicardipine drip, blood pressures currently controlled, transferred off ICU, she was transferred to [facility] 04/23/2022.   Assessment & Plan:   Principal Problem:   Hypertensive urgency Active Problems:   Cerebral embolism with cerebral infarction  Acute/Subacute L frontal, hippocampus, and R pons infarcts - right pontine infarct and punctate left hippocampus and frontal white matter infarcts, secondary to small vessel disease vs. cardioembolic source  - MRI  right pontine infarct and punctate left hippocampus and frontal white matter infarcts - 2D Echo EF 40 to 45%, no PFO -EEG normal, no seizures -Neurology recommends 30 days CardioNet monitoring to rule out A-fib -LDL of 102-hemoglobin A1c 6.8 -UDS positive for THC-currently on Plavix 75 mg oral daily( has aspirin allergy) -Seen By PT/OT, recommendation for CIR  Hypertensive encephalopathy -Did require  nicardipine drip initially -Encephalopathy has resolved, mentation back to baseline -Allow for permissive hypertension -Blood pressure is elevated, she will be resumed antihypertensive regimen gradually, there is room in blood pressure and heart rate today, so I will increase metoprolol to 50 mg oral twice  daily.  Nontypical chest pain Patient with musculoskeletal chest pain, reproducible by palpation, is with complaints of neuropathic pain for which I will start her on Lyrica.    Acute on chronic kidney disease -Creatinine peaked at 2.17, currently back to baseline.  Type II diabetes mellitus -A1c 6.8 -Continue with insulin sliding scale -Did on Farxiga as well specially in the setting of CHF.  Hypomagnesemia Mg has normalized.    Chronic heart failure with reduced ejection fraction (EF 35-40%) Non-obstructive coronary artery disease Does not appear hypervolemic, holding home GDMT, can resume once clinically improved. Will need to monitor volume status daily.  -repeat EF this admission with improvement 40 to 45%, no PFO. -back on metoprolol , will increase uptitrate dose gradually and resume back on losartan as well given her low EF keeping in mind gradual resumption of meds to allow for permissive hypertension with normal BP in 5 to 7 days -started on Farxiga -Titrating her metoprolol, will add losartan in 1 to 2 days if blood pressure allows.  Hyperlipidemia -LDL is 102, she is on Lipitor 80 mg at home, started on Zetia as well    Peripheral vascular disease  During most recent hospitalization found to have severe arterial disease in L, mild disease in R. Per chart review was having claudication symptoms. Vascular consulted at that time, no role for emergent revascularization. Will re-start home meds. - Plavix 75mg  daily - Atorvastatin 80mg  daily     Hypokalemia - repleted  DVT prophylaxis: Heparin Code Status: Full Family Communication: none at bedside Disposition: CIR  Status is: Inpatient, she is awaiting bed at CIR.    Consultants:  PCCM Neurology  Subjective:  He is reporting musculoskeletal chest pain, reproducible by palpation. Objective: Vitals:   04/25/22  2052 04/26/22 0010 04/26/22 0559 04/26/22 0828  BP: (!) 158/103 (!) 126/104 (!) 137/95 (!) 137/115   Pulse:  (!) 102 (!) 124 (!) 120  Resp:  Temp: 98.1 F (36.7 C) 98.4 F (36.9 C) 98.4 F (36.9 C)   TempSrc: Oral Oral Oral   SpO2:  96% 96% 97%  Weight:   90.2 kg     Intake/Output Summary (Last 24 hours) at 04/26/2022 1356 Last data filed at 04/26/2022 1013 Gross per 24 hour  Intake --  Output 1700 ml  Net -1700 ml   Filed Weights   04/23/22 0500 04/25/22 0353 04/26/22 0559  Weight: 89.4 kg 89.2 kg 90.2 kg    Examination:  Awake Alert, Oriented X 3, No new F.N deficits, Normal affect Symmetrical Chest wall movement, Good air movement bilaterally, CTAB RRR,No Gallops,Rubs or new Murmurs, No Parasternal Heave +ve B.Sounds, Abd Soft, No tenderness, No rebound - guarding or rigidity. No Cyanosis, Clubbing or edema, No new Rash or bruise        Data Reviewed: I have personally reviewed following labs and imaging studies  CBC: Recent Labs  Lab 04/20/22 1207 04/20/22 1249 04/21/22 0343 04/22/22 0144  WBC 10.3  --  17.7* 11.6*  NEUTROABS 6.9  --   --   --   HGB 13.1 14.6 14.1 10.7*  HCT 40.6 43.0 42.9 32.7*  MCV 91.2  --  86.8 88.9  PLT 276  --  354 246    Basic Metabolic Panel: Recent Labs  Lab 04/21/22 0226 04/21/22 1238 04/22/22 0144 04/23/22 0154 04/24/22 0157  NA 138 139 139 137 137  K 3.7 3.8 3.0* 3.4* 4.8  CL 101 104 100 102 107  CO2 17* 15* GLUCOSE 322* 343* 124* 140* 123*  BUN 20 21* 25* 16 13  CREATININE 1.32* 1.46* 2.17* 1.46* 1.29*  CALCIUM 7.2* 7.5* 7.0* 7.6* 8.7*  MG <0.5* <0.5* 3.3* 2.6*  --   PHOS 5.1*  --  5.4*  --   --     GFR: Estimated Creatinine Clearance: 49.6 mL/min (A) (by C-G formula based on SCr of 1.29 mg/dL (H)).  Liver Function Tests: Recent Labs  Lab 04/20/22 1207  AST 19  ALT 13  ALKPHOS 65  BILITOT 0.4  PROT 7.7  ALBUMIN 3.1*    CBG: Recent Labs  Lab 04/25/22 2050 04/26/22 0019 04/26/22 0438 04/26/22 0830 04/26/22 1232  GLUCAP 203* 198* 162* 132* 203*     Recent Results (from  the past 240 hour(s))  Blood culture (routine x 2)     Status: None   Collection Time: 04/20/22  8:50 PM   Specimen: BLOOD LEFT FOREARM  Result Value Ref Range Status   Specimen Description BLOOD LEFT FOREARM  Final   Special Requests   Final    BOTTLES DRAWN AEROBIC ONLY Blood Culture results may not be optimal due to an inadequate volume of blood received in culture bottles   Culture   Final    NO GROWTH 5 DAYS Performed at [facility], [address] [place], [place] [address]    Report Status 04/25/2022 FINAL  Final  Blood culture (routine x 2)     Status: None   Collection Time: 04/21/22 12:47 AM   Specimen: BLOOD  Result Value Ref Range Status   Specimen Description BLOOD SITE NOT SPECIFIED  Final   Special Requests   Final    BOTTLES DRAWN AEROBIC AND ANAEROBIC Blood  Culture adequate volume   Culture   Final    NO GROWTH 5 DAYS Performed at [facility], [address] [place], [place] [address]    Report Status 04/26/2022 FINAL  Final  Resp Panel by RT-PCR (Flu A&B, Covid) Nasopharyngeal Swab     Status: None   Collection Time: 04/21/22 12:50 AM   Specimen: Nasopharyngeal Swab; Nasopharyngeal(NP) swabs in vial transport medium  Result Value Ref Range Status   SARS Coronavirus 2 by RT PCR NEGATIVE NEGATIVE Final    Comment: (NOTE) SARS-CoV-2 target nucleic acids are NOT DETECTED.  The SARS-CoV-2 RNA is generally detectable in upper respiratory specimens during the acute phase of infection. The lowest concentration of SARS-CoV-2 viral copies this assay can detect is 138 copies/mL. A negative result does not preclude SARS-Cov-2 infection and should not be used as the sole basis for treatment or other patient management decisions. A negative result may occur with  improper specimen collection/handling, submission of specimen other than nasopharyngeal swab, presence of viral mutation(s) within the areas targeted by this assay, and inadequate number of  viral copies(<138 copies/mL). A negative result must be combined with clinical observations, patient history, and epidemiological information. The expected result is Negative.  Fact Sheet for Patients:  BloggerCourse.com  Fact Sheet for Healthcare Providers:  SeriousBroker.it  This test is no t yet approved or cleared by the [place] FDA and  has been authorized for detection and/or diagnosis of SARS-CoV-2 by FDA under an Emergency Use Authorization (EUA). This EUA will remain  in effect (meaning this test can be used) for the duration of the COVID-19 declaration under Section 564(b)(1) of the Act, 21 U.S.C.section 360bbb-3(b)(1), unless the authorization is terminated  or revoked sooner.       Influenza A by PCR NEGATIVE NEGATIVE Final   Influenza B by PCR NEGATIVE NEGATIVE Final    Comment: (NOTE) The Xpert Xpress SARS-CoV-2/FLU/RSV plus assay is intended as an aid in the diagnosis of influenza from Nasopharyngeal swab specimens and should not be used as a sole basis for treatment. Nasal washings and aspirates are unacceptable for Xpert Xpress SARS-CoV-2/FLU/RSV testing.  Fact Sheet for Patients: BloggerCourse.com  Fact Sheet for Healthcare Providers: SeriousBroker.it  This test is not yet approved or cleared by the [place] FDA and has been authorized for detection and/or diagnosis of SARS-CoV-2 by FDA under an Emergency Use Authorization (EUA). This EUA will remain in effect (meaning this test can be used) for the duration of the COVID-19 declaration under Section 564(b)(1) of the Act, 21 U.S.C. section 360bbb-3(b)(1), unless the authorization is terminated or revoked.  Performed at [facility], [address] [place], [place] [address]   MRSA Next Gen by PCR, Nasal     Status: None   Collection Time: 04/21/22  3:28 AM   Specimen: Nasal Mucosa; Nasal  Swab  Result Value Ref Range Status   MRSA by PCR Next Gen NOT DETECTED NOT DETECTED Final    Comment: (NOTE) The GeneXpert MRSA Assay (FDA approved for NASAL specimens only), is one component of a comprehensive MRSA colonization surveillance program. It is not intended to diagnose MRSA infection nor to guide or monitor treatment for MRSA infections. Test performance is not FDA approved in patients less than 35 years old. Performed at [facility], [address] [place], [place] [address]          Radiology Studies: No results found.      Scheduled Meds:  atorvastatin  80 mg Oral Daily   clopidogrel  75 mg Oral Daily   dapagliflozin propanediol  5 mg Oral Daily   ezetimibe  10 mg Oral Daily   heparin  5,000 Units Subcutaneous Q8H   insulin aspart  0-15 Units Subcutaneous Q4H   metoprolol tartrate  50 mg Oral BID   pantoprazole  40 mg Oral Daily   traZODone  150 mg Oral QHS   Continuous Infusions:  sodium chloride Stopped (04/22/22 1406)   promethazine (PHENERGAN) injection (IM or IVPB) Stopped (04/22/22 1355)     LOS: 5 days       Huey Bienenstock, MD [facility]   To contact the attending provider between 7A-7P or the covering provider during after hours 7P-7A, please log into the web site [email or web address] and access using universal [place] password for that web site. If you do not have the password, please call the hospital operator.  04/26/2022, 1:56 PM

## 2022-04-27 LAB — GLUCOSE, CAPILLARY
Glucose-Capillary: 127 mg/dL — ABNORMAL HIGH (ref 70–99)
Glucose-Capillary: 161 mg/dL — ABNORMAL HIGH (ref 70–99)
Glucose-Capillary: 182 mg/dL — ABNORMAL HIGH (ref 70–99)
Glucose-Capillary: 201 mg/dL — ABNORMAL HIGH (ref 70–99)
Glucose-Capillary: 223 mg/dL — ABNORMAL HIGH (ref 70–99)
Glucose-Capillary: 256 mg/dL — ABNORMAL HIGH (ref 70–99)
Glucose-Capillary: 329 mg/dL — ABNORMAL HIGH (ref 70–99)

## 2022-04-27 LAB — BASIC METABOLIC PANEL
Anion gap: 10 (ref 5–15)
BUN: 22 mg/dL — ABNORMAL HIGH (ref 6–20)
CO2: 21 mmol/L — ABNORMAL LOW (ref 22–32)
Calcium: 8.9 mg/dL (ref 8.9–10.3)
Chloride: 103 mmol/L (ref 98–111)
Creatinine, Ser: 1.65 mg/dL — ABNORMAL HIGH (ref 0.44–1.00)
GFR, Estimated: 36 mL/min — ABNORMAL LOW (ref >60)
Glucose, Bld: 122 mg/dL — ABNORMAL HIGH (ref 70–99)
Potassium: 4.7 mmol/L (ref 3.5–5.1)
Sodium: 134 mmol/L — ABNORMAL LOW (ref 135–145)

## 2022-04-27 LAB — CBC
HCT: 34.2 % — ABNORMAL LOW (ref 36.0–46.0)
Hemoglobin: 10.8 g/dL — ABNORMAL LOW (ref 12.0–15.0)
MCH: 28.5 pg (ref 26.0–34.0)
MCHC: 31.6 g/dL (ref 30.0–36.0)
MCV: 90.2 fL (ref 80.0–100.0)
Platelets: 287 10*3/uL (ref 150–400)
RBC: 3.79 MIL/uL — ABNORMAL LOW (ref 3.87–5.11)
RDW: 13.4 % (ref 11.5–15.5)
WBC: 11.3 10*3/uL — ABNORMAL HIGH (ref 4.0–10.5)
nRBC: 0 % (ref 0.0–0.2)

## 2022-04-27 MED ORDER — METOPROLOL TARTRATE 50 MG PO TABS
75.0000 mg | ORAL_TABLET | Freq: Two times a day (BID) | ORAL | Status: DC
  Administered 2022-04-27: 75 mg via ORAL
  Filled 2022-04-27: qty 1

## 2022-04-27 MED ORDER — LOSARTAN POTASSIUM 50 MG PO TABS
25.0000 mg | ORAL_TABLET | Freq: Every day | ORAL | Status: DC
  Administered 2022-04-27: 25 mg via ORAL
  Filled 2022-04-27: qty 1

## 2022-04-27 MED ORDER — DIPHENHYDRAMINE HCL 12.5 MG/5ML PO ELIX
12.5000 mg | ORAL_SOLUTION | Freq: Four times a day (QID) | ORAL | Status: DC | PRN
  Administered 2022-04-27 – 2022-04-29 (×7): 12.5 mg via ORAL
  Filled 2022-04-27 (×9): qty 5

## 2022-04-27 NOTE — Progress Notes (Signed)
Inpatient Rehab Admissions Coordinator:  ? ?Notified by [employer] [employer] of request for peer to peer.  Dr. Randol Kern completed and ins requested updated clinicals by 3 pm today.  PT/OT aware and working to see pt.  I will follow.  ? ?Estill Dooms, PT, DPT ?Admissions Coordinator ?[phone] ?04/27/22  ?10:21 AM ? ?

## 2022-04-27 NOTE — Progress Notes (Signed)
HOSPITAL MEDICINE OVERNIGHT EVENT NOTE   ? ?Earlier in the shift was notified the patient was having intermittent bouts of atypical chest discomfort similar to what she was experiencing earlier in the hospitalization. ? ?Nursing felt the patient appeared anxious at the bedside. ? ?Repeat troponin and D-dimer were obtained.  D-dimer was downtrending from previous D-dimer obtained 2 weeks ago and therefore acute thromboembolism was felt to be unlikely.  Repeat troponin was obtained which was found to be normal.  EKG was obtained revealing sinus tachycardia without ST segment change. ? ?Continue to monitor patient closely on telemetry. ? ?Vernelle Emerald  MD ?[facility]  ? ? ? ? ? ? ? ? ? ? ?

## 2022-04-27 NOTE — Progress Notes (Signed)
Occupational Therapy Treatment ?Patient Details ?Name: Erin Jackson ?MRN: [id number] ?DOB: [birth year] ?Today's Date: 04/27/2022 ? ? ?History of present illness Pt is a 56 y/o F presenting to [facility] on 5/2 with n/v and SOB. Found to have encephalopathy and seizure-like activity in the context of severe HTN. MRI shows small acute infarcts of left frontal white matter, hippocampus, and R pons. Pt in the ICU to monitor her tachycardia, HTN, and seizure activity.  CT head negative. PMH of HTN, DM, Anxiety, GERD, and recent heart cath on 4/20. ?  ?OT comments ? Pt calling for assistance to use BSC. Transferred from recliner to [facility] and back to recliner with +2 min assist. Pt able to complete pericare leaning to L side with set up. Washed and dried hands with set up.  ?Pt reports L shoulder rotator cuff repair in March and had been to OP therapy in April. Reached out to Dr Dub Mikes PA, Ralene Bathe who stated pt no longer has any L shoulder restrictions.   ? ?Recommendations for follow up therapy are one component of a multi-disciplinary discharge planning process, led by the attending physician.  Recommendations may be updated based on patient status, additional functional criteria and insurance authorization. ?   ?Follow Up Recommendations ? Acute inpatient rehab (3hours/day)  ?  ?Assistance Recommended at Discharge Frequent or constant Supervision/Assistance  ?Patient can return home with the following ? Two people to help with walking and/or transfers;Two people to help with bathing/dressing/bathroom;Assistance with feeding;Assistance with cooking/housework;Direct supervision/assist for medications management;Direct supervision/assist for financial management;Assist for transportation;Help with stairs or ramp for entrance ?  ?Equipment Recommendations ? BSC/3in1;Wheelchair (measurements OT);Wheelchair cushion (measurements OT);Tub/shower bench  ?  ?Recommendations for Other Services   ? ?  ?Precautions / Restrictions  Precautions ?Precautions: Fall ?Precaution Comments: per Ralene Bathe, PA, no limitations to L shoulder s/p rotator cuff repair in March ?Restrictions ?Weight Bearing Restrictions: No  ? ? ?  ? ?Mobility Bed Mobility ?Overal bed mobility: Needs Assistance ?Bed Mobility: Supine to Sit ?  ?  ?Supine to sit: Mod assist ?  ?  ?General bed mobility comments: received and returned to chair ?  ? ?Transfers ?Overall transfer level: Needs assistance ?  ?Transfers: Sit to/from Stand, Bed to chair/wheelchair/BSC ?Sit to Stand: +2 physical assistance, Min assist ?Stand pivot transfers: +2 physical assistance, Min assist ?  ?Step pivot transfers: +2 physical assistance, Mod assist ?  ?  ?General transfer comment: assist to rise and steady as pt took pivotal steps to and from [facility] from recliner ?  ?  ?Balance Overall balance assessment: Needs assistance ?  ?Sitting balance-Leahy Scale: Fair ?Sitting balance - Comments: min guard assist at EOB ?  ?Standing balance support: Single extremity supported ?Standing balance-Leahy Scale: Poor ?Standing balance comment: +2 min assist in static standing, +2 mod for dynamic ?  ?  ?  ?  ?  ?  ?  ?  ?  ?  ?  ?   ? ?ADL either performed or assessed with clinical judgement  ? ?ADL Overall ADL's : Needs assistance/impaired ?  ?  ?Grooming: Set up;Sitting;Wash/dry hands ?  ?  ?  ?  ?  ?Upper Body Dressing : Minimal assistance;Sitting ?Upper Body Dressing Details (indicate cue type and reason): to doff front opening gown ?Lower Body Dressing: Total assistance;Bed level ?  ?Toilet Transfer: +2 for physical assistance;Minimal assistance;Stand-pivot;BSC/3in1 ?  ?Toileting- Clothing Manipulation and Hygiene: Supervision/safety;Sitting/lateral lean ?Toileting - Clothing Manipulation Details (indicate cue type and reason): pt with  good thoroughness with pericare in sitting ?  ?  ?Functional mobility during ADLs: +2 for physical assistance;Moderate assistance ?  ?  ? ?Extremity/Trunk Assessment   ?  ?  ?   ?  ?  ? ?Vision   ?Additional Comments: diplopia has resolved, pt reports blurriness in peripheral field of L eye ?  ?Perception   ?  ?Praxis   ?  ? ?Cognition Arousal/Alertness: Awake/alert ?Behavior During Therapy: Flat affect ?Overall Cognitive Status: Impaired/Different from baseline ?Area of Impairment: Problem solving, Awareness, Safety/judgement, Following commands, Attention ?  ?  ?  ?  ?  ?  ?  ?  ?Orientation Level: Disoriented to, Time ?Current Attention Level: Selective ?  ?Following Commands: Follows one step commands with increased time ?Safety/Judgement: Decreased awareness of safety, Decreased awareness of deficits ?Awareness: Intellectual ?Problem Solving: Slow processing, Decreased initiation, Difficulty sequencing, Requires verbal cues ?General Comments: pt report baseline memory deficits, pt unaware it was May, used phone to determine when had her L shoulder surgery ?  ?  ?   ?Exercises   ? ?  ?Shoulder Instructions   ? ? ?  ?General Comments    ? ? ?Pertinent Vitals/ Pain       Pain Assessment ?Pain Assessment: Faces ?Faces Pain Scale: Hurts whole lot ?Pain Location: L shoulder ?Pain Descriptors / Indicators: Grimacing, Guarding ?Pain Intervention(s): Monitored during session, Repositioned ? ?Home Living   ?  ?  ?  ?  ?  ?  ?  ?  ?  ?  ?  ?  ?  ?  ?  ?  ?  ?  ? ?  ?Prior Functioning/Environment    ?  ?  ?  ?   ? ?Frequency ? Min 2X/week  ? ? ? ? ?  ?Progress Toward Goals ? ?OT Goals(current goals can now be found in the care plan section) ? Progress towards OT goals: Progressing toward goals ? ?Acute Rehab OT Goals ?OT Goal Formulation: With patient ?Time For Goal Achievement: 05/06/22 ?Potential to Achieve Goals: Good  ?Plan Discharge plan remains appropriate   ? ?Co-evaluation ? ? ? PT/OT/SLP Co-Evaluation/Treatment: Yes ?Reason for Co-Treatment: For patient/therapist safety;To address functional/ADL transfers ?PT goals addressed during session: Mobility/safety with mobility;Balance;Proper  use of DME;Strengthening/ROM ?OT goals addressed during session: Strengthening/ROM ?  ? ?  ?AM-PAC OT "6 Clicks" Daily Activity     ?Outcome Measure ? ? Help from another person eating meals?: A Little ?Help from another person taking care of personal grooming?: A Little ?Help from another person toileting, which includes using toliet, bedpan, or urinal?: A Lot ?Help from another person bathing (including washing, rinsing, drying)?: A Lot ?Help from another person to put on and taking off regular upper body clothing?: A Lot ?Help from another person to put on and taking off regular lower body clothing?: Total ?6 Click Score: 13 ? ?  ?End of Session Equipment Utilized During Treatment: Gait belt ? ?OT Visit Diagnosis: Unsteadiness on feet (R26.81);Other abnormalities of gait and mobility (R26.89);Muscle weakness (generalized) (M62.81) ?  ?Activity Tolerance Patient tolerated treatment well ?  ?Patient Left in chair;with call bell/phone within reach;with chair alarm set ?  ?Nurse Communication Mobility status ?  ? ?   ? ?Time: 3536-1443 ?OT Time Calculation (min): 20 min ? ?Charges: OT General Charges ?$OT Visit: 1 Visit ?OT Treatments ?$Self Care/Home Management : 8-22 mins ?$Neuromuscular Re-education: 8-22 mins ? ?Martie Round, OTR/L ?Acute Rehabilitation Services ?Pager: [phone] ?Office: [phone]  ? ?Martie Round  Larita FifeLynn ?04/27/2022, 1:24 PM ?

## 2022-04-27 NOTE — Progress Notes (Signed)
Occupational Therapy Treatment ?Patient Details ?Name: Erin Jackson ?MRN: [id number] ?DOB: May 11, 1966 ?Today's Date: 04/27/2022 ? ? ?History of present illness Pt is a 56 y/o F presenting to [facility] on 5/2 with n/v and SOB. Found to have encephalopathy and seizure-like activity in the context of severe HTN. MRI shows small acute infarcts of left frontal white matter, hippocampus, and R pons. Pt in the ICU to monitor her tachycardia, HTN, and seizure activity.  CT head negative. PMH of HTN, DM, Anxiety, GERD, and recent heart cath on 4/20. ?  ?OT comments ? Pt readily willing to work with therapies. Pt reports working on moving her L upper and lower extremities over the weekend. Demonstrating ability to flex and extend L fingers and wrist and supinate L forearm half range. Assisted pt with AAROM L elbow within pain tolerance. Pain with L scapular mobilization and attempt to passively range shoulder. Pt denies dizziness and headache this visit, less anxious, but continues to be tearful with level of dependence. Pt able to bear weight and take steps with L LE with +2 moderate assistance with increased time and effort. HR to 147 with gait training at rail in hall. Continues to be an excellent rehab candidate.   ? ?Recommendations for follow up therapy are one component of a multi-disciplinary discharge planning process, led by the attending physician.  Recommendations may be updated based on patient status, additional functional criteria and insurance authorization. ?   ?Follow Up Recommendations ? Acute inpatient rehab (3hours/day)  ?  ?Assistance Recommended at Discharge Frequent or constant Supervision/Assistance  ?Patient can return home with the following ? Two people to help with walking and/or transfers;Two people to help with bathing/dressing/bathroom;Assistance with feeding;Assistance with cooking/housework;Direct supervision/assist for medications management;Direct supervision/assist for financial management;Assist  for transportation;Help with stairs or ramp for entrance ?  ?Equipment Recommendations ? BSC/3in1;Wheelchair (measurements OT);Wheelchair cushion (measurements OT)  ?  ?Recommendations for Other Services   ? ?  ?Precautions / Restrictions Precautions ?Precautions: Fall ?Restrictions ?Weight Bearing Restrictions: No  ? ? ?  ? ?Mobility Bed Mobility ?Overal bed mobility: Needs Assistance ?Bed Mobility: Supine to Sit ?  ?  ?Supine to sit: Mod assist ?  ?  ?General bed mobility comments: assist for L LE over EOB and to raise trunk, cues for technique, increased time, pt able to advance hips to EOB without physical assist ?  ? ?Transfers ?Overall transfer level: Needs assistance ?  ?Transfers: Sit to/from Stand, Bed to chair/wheelchair/BSC ?Sit to Stand: +2 physical assistance, Mod assist ?  ?  ?Step pivot transfers: +2 physical assistance, Mod assist ?  ?  ?General transfer comment: L LE blocked, but no buckling noted, pt with HR in 140s, transferred to chair ?  ?  ?Balance Overall balance assessment: Needs assistance ?  ?Sitting balance-Leahy Scale: Fair ?Sitting balance - Comments: min guard assist at EOB ?  ?Standing balance support: Single extremity supported ?Standing balance-Leahy Scale: Poor ?Standing balance comment: +2 min assist in static standing, +2 mod for dynamic ?  ?  ?  ?  ?  ?  ?  ?  ?  ?  ?  ?   ? ?ADL either performed or assessed with clinical judgement  ? ?ADL   ?  ?  ?  ?  ?  ?  ?  ?  ?Upper Body Dressing : Moderate assistance;Sitting ?  ?Lower Body Dressing: Total assistance;Bed level ?  ?  ?  ?  ?  ?  ?  ?Functional  mobility during ADLs: +2 for physical assistance;Moderate assistance ?  ?  ? ?Extremity/Trunk Assessment   ?  ?  ?  ?  ?  ? ?Vision   ?Additional Comments: diplopia has resolved, pt reports blurriness in peripheral field of L eye ?  ?Perception   ?  ?Praxis   ?  ? ?Cognition Arousal/Alertness: Awake/alert ?Behavior During Therapy: Anxious, Flat affect (reports she feels depressed,  tearful at times) ?Overall Cognitive Status: Impaired/Different from baseline ?Area of Impairment: Problem solving, Awareness, Safety/judgement, Following commands, Attention ?  ?  ?  ?  ?  ?  ?  ?  ?Orientation Level: Disoriented to, Time ?Current Attention Level: Selective ?  ?Following Commands: Follows one step commands with increased time ?Safety/Judgement: Decreased awareness of safety, Decreased awareness of deficits ?Awareness: Intellectual ?Problem Solving: Slow processing, Decreased initiation, Difficulty sequencing, Requires verbal cues ?  ?  ?  ?   ?Exercises   ? ?  ?Shoulder Instructions   ? ? ?  ?General Comments    ? ? ?Pertinent Vitals/ Pain       Pain Assessment ?Pain Assessment: Faces ?Faces Pain Scale: Hurts whole lot ?Pain Location: L shoulder ?Pain Descriptors / Indicators: Grimacing, Guarding ?Pain Intervention(s): Monitored during session, Repositioned ? ?Home Living   ?  ?  ?  ?  ?  ?  ?  ?  ?  ?  ?  ?  ?  ?  ?  ?  ?  ?  ? ?  ?Prior Functioning/Environment    ?  ?  ?  ?   ? ?Frequency ? Min 2X/week  ? ? ? ? ?  ?Progress Toward Goals ? ?OT Goals(current goals can now be found in the care plan section) ? Progress towards OT goals: Progressing toward goals ? ?Acute Rehab OT Goals ?OT Goal Formulation: With patient ?Time For Goal Achievement: 05/06/22 ?Potential to Achieve Goals: Good  ?Plan Discharge plan remains appropriate   ? ?Co-evaluation ? ? ? PT/OT/SLP Co-Evaluation/Treatment: Yes ?Reason for Co-Treatment: For patient/therapist safety ?  ?OT goals addressed during session: Strengthening/ROM ?  ? ?  ?AM-PAC OT "6 Clicks" Daily Activity     ?Outcome Measure ? ? Help from another person eating meals?: A Little ?Help from another person taking care of personal grooming?: A Little ?Help from another person toileting, which includes using toliet, bedpan, or urinal?: A Lot ?Help from another person bathing (including washing, rinsing, drying)?: A Lot ?Help from another person to put on and  taking off regular upper body clothing?: A Lot ?Help from another person to put on and taking off regular lower body clothing?: Total ?6 Click Score: 13 ? ?  ?End of Session Equipment Utilized During Treatment: Gait belt ? ?OT Visit Diagnosis: Unsteadiness on feet (R26.81);Other abnormalities of gait and mobility (R26.89);Muscle weakness (generalized) (M62.81) ?  ?Activity Tolerance Treatment limited secondary to medical complications (Comment) (HR in 140s with exertion) ?  ?Patient Left in chair;with call bell/phone within reach;with chair alarm set ?  ?Nurse Communication   ?  ? ?   ? ?Time: 9675-9163 ?OT Time Calculation (min): 41 min ? ?Charges: OT General Charges ?$OT Visit: 1 Visit ?OT Treatments ?$Self Care/Home Management : 8-22 mins ?$Neuromuscular Re-education: 8-22 mins ? ?Martie Round, OTR/L ?Acute Rehabilitation Services ?Pager: [phone] ?Office: [phone]  ? ?Erin Jackson ?04/27/2022, 1:10 PM ?

## 2022-04-27 NOTE — Progress Notes (Signed)
PROGRESS NOTE    Erin Jackson  RJJ:[id number] DOB: 14-Aug-1966 DOA: 04/20/2022 PCP: Raymon Mutton., FNP   Chief Complaint  Patient presents with   Shortness of Breath   Nausea   Weakness   Emesis    Brief Narrative:   56 y.o. female with history of hypertension, diabetes mellitus, anxiety, hyperlipidemia, CAD, hyperlipidemia, chronic systolic CHF with EF 35 to 40%, left femoral artery occlusion/PVD, atherosclerosis, tobacco abuse, hypertension, diabetes mellitus type 2, chronic pain syndrome, patient presents to ED secondary to left face numbness, dizziness, work-up significant for multifocal acute CVA, and hypertensive emergency on admission with blood pressure 239/16, she was admitted to ICU, for nicardipine drip, blood pressures currently controlled, transferred off ICU, she was transferred to [facility] 04/23/2022.   Assessment & Plan:   Principal Problem:   Hypertensive urgency Active Problems:   Cerebral embolism with cerebral infarction  Acute/Subacute L frontal, hippocampus, and R pons infarcts - right pontine infarct and punctate left hippocampus and frontal white matter infarcts, secondary to small vessel disease vs. cardioembolic source  - MRI  right pontine infarct and punctate left hippocampus and frontal white matter infarcts - 2D Echo EF 40 to 45%, no PFO -EEG normal, no seizures -Neurology recommends 30 days CardioNet monitoring to rule out A-fib -LDL of 102-hemoglobin A1c 6.8 -UDS positive for THC-currently on Plavix 75 mg oral daily( has aspirin allergy) -Seen By PT/OT, recommendation for CIR  Hypertensive encephalopathy -Did require  nicardipine drip initially -Encephalopathy has resolved, mentation back to baseline -Allow for permissive hypertension -Blood pressure is elevated, she will be resumed antihypertensive regimen gradually, increase metoprolol from 50 to 75 mg twice daily specially with tachycardia.  Sinus tachycardia -Due to deconditioning,  and rebound tachycardia from holding beta-blockers initially is allowing for permissive hypertension, now she is being gradually resumed on her beta-blockers, will increase her metoprolol to 75 mg p.o. twice daily.  Nontypical chest pain Patient with musculoskeletal chest pain, reproducible by palpation, is with complaints of neuropathic pain, this has resolved  after starting Lyrica.     Acute on chronic kidney disease -Creatinine peaked at 2.17, currently back to baseline at 1.65.  Type II diabetes mellitus -A1c 6.8 -Continue with insulin sliding scale -Started on Farxiga as well specially in the setting of CHF.  Hypomagnesemia Mg has normalized.    Chronic heart failure with reduced ejection fraction (EF 35-40%) Non-obstructive coronary artery disease Does not appear hypervolemic, holding home GDMT, can resume once clinically improved. Will need to monitor volume status daily.  -repeat EF this admission with improvement 40 to 45%, no PFO. -back on metoprolol , will increase uptitrate dose gradually and resume back on losartan as well given her low EF keeping in mind gradual resumption of meds to allow for permissive hypertension with normal BP in 5 to 7 days -started on Farxiga -Titrating her metoprolol,  -Darted on losartan  Hyperlipidemia -LDL is 102, she is on Lipitor 80 mg at home, started on Zetia as well    Peripheral vascular disease  During most recent hospitalization found to have severe arterial disease in L, mild disease in R. Per chart review was having claudication symptoms. Vascular consulted at that time, no role for emergent revascularization. Will re-start home meds. - Plavix 75mg  daily - Atorvastatin 80mg  daily     Hypokalemia - repleted  DVT prophylaxis: Heparin Code Status: Full Family Communication: none at bedside Disposition: CIR  Status is: Inpatient, she is awaiting bed at CIR.  Consultants:  PCCM Neurology  Subjective:  No further  reproducible chest pain on palpation, she reports generalized weakness, fatigue. Objective: Vitals:   04/27/22 0500 04/27/22 0506 04/27/22 0743 04/27/22 1152  BP:  (!) 134/104 (!) 138/95 105/85  Pulse:  (!) 102 (!) 110 (!) 109  Resp:  Temp:  98 F (36.7 C) 98.2 F (36.8 C) 98 F (36.7 C)  TempSrc:  Oral Oral Oral  SpO2:  97% 96% 97%  Weight: 98.9 kg       Intake/Output Summary (Last 24 hours) at 04/27/2022 1514 Last data filed at 04/26/2022 2300 Gross per 24 hour  Intake 840 ml  Output 700 ml  Net 140 ml   Filed Weights   04/25/22 0353 04/26/22 0559 04/27/22 0500  Weight: 89.2 kg 90.2 kg 98.9 kg    Examination:  Awake Alert, Oriented X 3, No new F.N deficits, Normal affect, frail Symmetrical Chest wall movement, Good air movement bilaterally, CTAB Tachycardic,No Gallops,Rubs or new Murmurs, No Parasternal Heave +ve B.Sounds, Abd Soft, No tenderness, No rebound - guarding or rigidity. No Cyanosis, Clubbing or edema, No new Rash or bruise         Data Reviewed: I have personally reviewed following labs and imaging studies  CBC: Recent Labs  Lab 04/21/22 0343 04/22/22 0144 04/27/22 0029  WBC 17.7* 11.6* 11.3*  HGB 14.1 10.7* 10.8*  HCT 42.9 32.7* 34.2*  MCV 86.8 88.9 90.2  PLT 354 246 287    Basic Metabolic Panel: Recent Labs  Lab 04/21/22 0226 04/21/22 1238 04/22/22 0144 04/23/22 0154 04/24/22 0157 04/26/22 2234 04/27/22 0029  NA 138 139 139 137 137 131* 134*  K 3.7 3.8 3.0* 3.4* 4.8 4.6 4.7  CL 101 104 100 102 107 102 103  CO2 17* 15* 21* 21*  GLUCOSE 322* 343* 124* 140* 123* 123* 122*  BUN 20 21* 25* 16 13 22* 22*  CREATININE 1.32* 1.46* 2.17* 1.46* 1.29* 1.70* 1.65*  CALCIUM 7.2* 7.5* 7.0* 7.6* 8.7* 8.7* 8.9  MG <0.5* <0.5* 3.3* 2.6*  --   --   --   PHOS 5.1*  --  5.4*  --   --  3.1  --     GFR: Estimated Creatinine Clearance: 40.8 mL/min (A) (by C-G formula based on SCr of 1.65 mg/dL (H)).  Liver Function Tests: Recent  Labs  Lab 04/26/22 2234  ALBUMIN 2.2*    CBG: Recent Labs  Lab 04/26/22 2319 04/27/22 0029 04/27/22 0511 04/27/22 0747 04/27/22 1205  GLUCAP 121* 161* 182* 127* 256*     Recent Results (from the past 240 hour(s))  Blood culture (routine x 2)     Status: None   Collection Time: 04/20/22  8:50 PM   Specimen: BLOOD LEFT FOREARM  Result Value Ref Range Status   Specimen Description BLOOD LEFT FOREARM  Final   Special Requests   Final    BOTTLES DRAWN AEROBIC ONLY Blood Culture results may not be optimal due to an inadequate volume of blood received in culture bottles   Culture   Final    NO GROWTH 5 DAYS Performed at [facility], [address], [place], [place] [address]    Report Status 04/25/2022 FINAL  Final  Blood culture (routine x 2)     Status: None   Collection Time: 04/21/22 12:47 AM   Specimen: BLOOD  Result Value Ref Range Status   Specimen Description BLOOD SITE NOT SPECIFIED  Final  Special Requests   Final    BOTTLES DRAWN AEROBIC AND ANAEROBIC Blood Culture adequate volume   Culture   Final    NO GROWTH 5 DAYS Performed at [facility], [address], [place], [place] [address]    Report Status 04/26/2022 FINAL  Final  Resp Panel by RT-PCR (Flu A&B, Covid) Nasopharyngeal Swab     Status: None   Collection Time: 04/21/22 12:50 AM   Specimen: Nasopharyngeal Swab; Nasopharyngeal(NP) swabs in vial transport medium  Result Value Ref Range Status   SARS Coronavirus 2 by RT PCR NEGATIVE NEGATIVE Final    Comment: (NOTE) SARS-CoV-2 target nucleic acids are NOT DETECTED.  The SARS-CoV-2 RNA is generally detectable in upper respiratory specimens during the acute phase of infection. The lowest concentration of SARS-CoV-2 viral copies this assay can detect is 138 copies/mL. A negative result does not preclude SARS-Cov-2 infection and should not be used as the sole basis for treatment or other patient management decisions. A negative result may  occur with  improper specimen collection/handling, submission of specimen other than nasopharyngeal swab, presence of viral mutation(s) within the areas targeted by this assay, and inadequate number of viral copies(<138 copies/mL). A negative result must be combined with clinical observations, patient history, and epidemiological information. The expected result is Negative.  Fact Sheet for Patients:  BloggerCourse.com  Fact Sheet for Healthcare Providers:  SeriousBroker.it  This test is no t yet approved or cleared by the [place] FDA and  has been authorized for detection and/or diagnosis of SARS-CoV-2 by FDA under an Emergency Use Authorization (EUA). This EUA will remain  in effect (meaning this test can be used) for the duration of the COVID-19 declaration under Section 564(b)(1) of the Act, 21 U.S.C.section 360bbb-3(b)(1), unless the authorization is terminated  or revoked sooner.       Influenza A by PCR NEGATIVE NEGATIVE Final   Influenza B by PCR NEGATIVE NEGATIVE Final    Comment: (NOTE) The Xpert Xpress SARS-CoV-2/FLU/RSV plus assay is intended as an aid in the diagnosis of influenza from Nasopharyngeal swab specimens and should not be used as a sole basis for treatment. Nasal washings and aspirates are unacceptable for Xpert Xpress SARS-CoV-2/FLU/RSV testing.  Fact Sheet for Patients: BloggerCourse.com  Fact Sheet for Healthcare Providers: SeriousBroker.it  This test is not yet approved or cleared by the [place] FDA and has been authorized for detection and/or diagnosis of SARS-CoV-2 by FDA under an Emergency Use Authorization (EUA). This EUA will remain in effect (meaning this test can be used) for the duration of the COVID-19 declaration under Section 564(b)(1) of the Act, 21 U.S.C. section 360bbb-3(b)(1), unless the authorization is terminated  or revoked.  Performed at [facility], [address], [place], [place] [address]   MRSA Next Gen by PCR, Nasal     Status: None   Collection Time: 04/21/22  3:28 AM   Specimen: Nasal Mucosa; Nasal Swab  Result Value Ref Range Status   MRSA by PCR Next Gen NOT DETECTED NOT DETECTED Final    Comment: (NOTE) The GeneXpert MRSA Assay (FDA approved for NASAL specimens only), is one component of a comprehensive MRSA colonization surveillance program. It is not intended to diagnose MRSA infection nor to guide or monitor treatment for MRSA infections. Test performance is not FDA approved in patients less than 91 years old. Performed at [facility], [address] [place], [place] [address]  Radiology Studies: No results found.      Scheduled Meds:  atorvastatin  80 mg Oral Daily   clopidogrel  75 mg Oral Daily   dapagliflozin propanediol  5 mg Oral Daily   ezetimibe  10 mg Oral Daily   heparin  5,000 Units Subcutaneous Q8H   insulin aspart  0-15 Units Subcutaneous Q4H   losartan  25 mg Oral Daily   metoprolol tartrate  75 mg Oral BID   pantoprazole  40 mg Oral Daily   pregabalin  50 mg Oral Daily   traZODone  150 mg Oral QHS   Continuous Infusions:  sodium chloride Stopped (04/22/22 1406)   promethazine (PHENERGAN) injection (IM or IVPB) Stopped (04/22/22 1355)     LOS: 6 days       Huey Bienenstockawood Ameliarose Shark, MD [facility]   To contact the attending provider between 7A-7P or the covering provider during after hours 7P-7A, please log into the web site [email or web address] and access using universal [place] password for that web site. If you do not have the password, please call the hospital operator.  04/27/2022, 3:14 PM

## 2022-04-27 NOTE — Progress Notes (Addendum)
Physical Therapy Treatment ?Patient Details ?Name: Erin Jackson ?MRN: [id number] ?DOB: 1966/07/24 ?Today's Date: 04/27/2022 ? ? ?History of Present Illness Pt is a 56 y/o F presenting to [facility] on 5/2 with n/v and SOB. Found to have encephalopathy and seizure-like activity in the context of severe HTN. MRI shows small acute infarcts of left frontal white matter, hippocampus, and R pons. Pt in the ICU to monitor her tachycardia, HTN, and seizure activity.  CT head negative. PMH of HTN, DM, Anxiety, GERD, and recent heart cath on 4/20. ? ?  ?PT Comments  ? ? Pt progressing towards physical therapy goals. Was able to perform ~5 feet of ambulation this session, with +2 assist for balance support and safety. Activity somewhat limited by tachycardia - 135 bpm at rest, increasing to 147 bpm and reports of fatigue with OOB activity. Chair follow utilized as well. Pt motivated to participate and continue to feel she would thrive in a faster paced therapy environment. Continues to be an excellent AIR candidate. Will continue to follow. ?  ?Recommendations for follow up therapy are one component of a multi-disciplinary discharge planning process, led by the attending physician.  Recommendations may be updated based on patient status, additional functional criteria and insurance authorization. ? ?Follow Up Recommendations ? Acute inpatient rehab (3hours/day) ?  ?  ?Assistance Recommended at Discharge Frequent or constant Supervision/Assistance  ?Patient can return home with the following A lot of help with bathing/dressing/bathroom;Assistance with cooking/housework;Direct supervision/assist for medications management;Direct supervision/assist for financial management;Assist for transportation;Help with stairs or ramp for entrance;Two people to help with walking and/or transfers ?  ?Equipment Recommendations ? Rolling walker (2 wheels);BSC/3in1  ?  ?Recommendations for Other Services Rehab consult ? ? ?  ?Precautions / Restrictions  Precautions ?Precautions: Fall ?Precaution Comments: per Ralene Bathe, PA, no limitations to L shoulder s/p rotator cuff repair in March ?Restrictions ?Weight Bearing Restrictions: No  ?  ? ?Mobility ? Bed Mobility ?Overal bed mobility: Needs Assistance ?Bed Mobility: Supine to Sit ?  ?  ?Supine to sit: Mod assist, HOB elevated, +2 for safety/equipment ?  ?  ?General bed mobility comments: Min assist for LLE advancement towards EOB, and mod assist to elevate trunk to full sitting position. Bed pad utilized for scooting. Improved ability to scoot L hip out fully to EOB. ?  ? ?Transfers ?Overall transfer level: Needs assistance ?Equipment used: 2 person hand held assist ?Transfers: Sit to/from Stand, Bed to chair/wheelchair/BSC ?Sit to Stand: Mod assist, +2 physical assistance ?Stand pivot transfers: +2 physical assistance, Max assist ?  ?  ?  ?  ?General transfer comment: Pt able to pivot LLE around a little more this session for transfer bed>chair. HR up to 145 bpm during this time. ?  ? ?Ambulation/Gait ?Ambulation/Gait assistance: Mod assist, +2 physical assistance, +2 safety/equipment (Third person utilized for chair folloq) ?Gait Distance (Feet): 5 Feet ?Assistive device: 2 person hand held assist (Pt utilizing railing on the R side in the hall) ?Gait Pattern/deviations: Step-to pattern, Knee hyperextension - left, Decreased weight shift to left, Trunk flexed, Narrow base of support, Decreased dorsiflexion - left ?Gait velocity: Decreased ?Gait velocity interpretation: <1.31 ft/sec, indicative of household ambulator ?Pre-gait activities: weight shifting EOB ?General Gait Details: Pt with heavy lean on R railing in hallway. PT on L side facilitating weight shift, and positioning L foot with advancement. Noted knee hyperextension with weight bearing. VC's for step-by-step sequencing. Gait training ended with pt fatigue and HR up to 147 bpm. ? ? ?Stairs ?  ?  ?  ?  ?  ? ? ?  Wheelchair Mobility ?  ? ?Modified Rankin  (Stroke Patients Only) ?Modified Rankin (Stroke Patients Only) ?Pre-Morbid Rankin Score: Slight disability ?Modified Rankin: Moderately severe disability ? ? ?  ?Balance Overall balance assessment: Needs assistance ?Sitting-balance support: Feet supported, Single extremity supported ?Sitting balance-Leahy Scale: Fair ?Sitting balance - Comments: Requiring min guard to min A support EOB ?Postural control: Posterior lean ?Standing balance support: Bilateral upper extremity supported ?Standing balance-Leahy Scale: Zero ?Standing balance comment: +2 assist required. ?  ?  ?  ?  ?  ?  ?  ?  ?  ?  ?  ?  ? ?  ?Cognition Arousal/Alertness: Awake/alert ?Behavior During Therapy: Flat affect, Anxious (motivated) ?Overall Cognitive Status: Impaired/Different from baseline ?Area of Impairment: Problem solving, Awareness, Safety/judgement, Following commands, Attention ?  ?  ?  ?  ?  ?  ?  ?  ?Orientation Level: Disoriented to, Time ?Current Attention Level: Selective ?  ?Following Commands: Follows one step commands consistently, Follows one step commands with increased time, Follows multi-step commands inconsistently ?Safety/Judgement: Decreased awareness of safety ?Awareness: Intellectual, Emergent ?Problem Solving: Slow processing, Decreased initiation, Difficulty sequencing, Requires verbal cues, Requires tactile cues ?  ?  ?  ? ?  ?Exercises   ? ?  ?General Comments   ?  ?  ? ?Pertinent Vitals/Pain Pain Assessment ?Pain Assessment: Faces ?Faces Pain Scale: Hurts even more ?Pain Location: L UE with movement ?Pain Descriptors / Indicators: Grimacing, Guarding ?Pain Intervention(s): Limited activity within patient's tolerance, Monitored during session, Repositioned  ? ? ?Home Living   ?  ?  ?  ?  ?  ?  ?  ?  ?  ?   ?  ?Prior Function    ?  ?  ?   ? ?PT Goals (current goals can now be found in the care plan section) Acute Rehab PT Goals ?Patient Stated Goal: walk again ?PT Goal Formulation: With patient ?Time For Goal  Achievement: 05/06/22 ?Potential to Achieve Goals: Fair ?Progress towards PT goals: Progressing toward goals ? ?  ?Frequency ? ? ? Min 4X/week ? ? ? ?  ?PT Plan Frequency needs to be updated  ? ? ?Co-evaluation PT/OT/SLP Co-Evaluation/Treatment: Yes ?Reason for Co-Treatment: For patient/therapist safety;To address functional/ADL transfers ?PT goals addressed during session: Mobility/safety with mobility;Balance;Proper use of DME;Strengthening/ROM ?OT goals addressed during session: Strengthening/ROM ?  ? ?  ?AM-PAC PT "6 Clicks" Mobility   ?Outcome Measure ? Help needed turning from your back to your side while in a flat bed without using bedrails?: A Lot ?Help needed moving from lying on your back to sitting on the side of a flat bed without using bedrails?: A Lot ?Help needed moving to and from a bed to a chair (including a wheelchair)?: Total ?Help needed standing up from a chair using your arms (e.g., wheelchair or bedside chair)?: Total ?Help needed to walk in hospital room?: Total ?Help needed climbing 3-5 steps with a railing? : Total ?6 Click Score: 8 ? ?  ?End of Session Equipment Utilized During Treatment: Gait belt ?Activity Tolerance: Treatment limited secondary to medical complications (Comment) ?Patient left: in chair;with call bell/phone within reach;with chair alarm set ?Nurse Communication: Mobility status ?PT Visit Diagnosis: Unsteadiness on feet (R26.81);Dizziness and giddiness (R42);Muscle weakness (generalized) (M62.81) ?  ? ? ?Time: 1191-47821019-1103 ?PT Time Calculation (min) (ACUTE ONLY): 44 min ? ?Charges:  $Gait Training: 23-37 mins          ?          ? ?  [name], PT, DPT ?Acute Rehabilitation Services ?Secure Chat Preferred ?Office: [phone]  ? ? ?[name] ?04/27/2022, 1:31 PM ? ?

## 2022-04-27 NOTE — Progress Notes (Signed)
Insulin sliding scale medication was mistakenly held at 2108 for a blood glucose of 148. The patient was supposed to get 2 units at this time. She did not receive any insulin.  ? ?Blood glucose rechecked at 2319 and was 121. Shauna Hugh MD was notified about missed dose and current blood sugar. This was a quick decrease over only a couple hours without any insulin given. I was told to recheck in 1-2 hours and give insulin per sliding scale. ? ?Blood Glucose at 0029 was 161. The patient did not have any food or drink within the hour to account for such a change. The patient was given 3 units per sliding scale (see MAR). Will continue to check blood glucose as ordered (every 4 hours).  ?

## 2022-04-28 LAB — GLUCOSE, CAPILLARY
Glucose-Capillary: 116 mg/dL — ABNORMAL HIGH (ref 70–99)
Glucose-Capillary: 137 mg/dL — ABNORMAL HIGH (ref 70–99)
Glucose-Capillary: 208 mg/dL — ABNORMAL HIGH (ref 70–99)
Glucose-Capillary: 224 mg/dL — ABNORMAL HIGH (ref 70–99)
Glucose-Capillary: 238 mg/dL — ABNORMAL HIGH (ref 70–99)
Glucose-Capillary: 261 mg/dL — ABNORMAL HIGH (ref 70–99)
Glucose-Capillary: 291 mg/dL — ABNORMAL HIGH (ref 70–99)

## 2022-04-28 MED ORDER — METOPROLOL TARTRATE 50 MG PO TABS
50.0000 mg | ORAL_TABLET | Freq: Two times a day (BID) | ORAL | Status: DC
  Administered 2022-04-28 – 2022-04-29 (×3): 50 mg via ORAL
  Filled 2022-04-28 (×3): qty 1

## 2022-04-28 NOTE — Progress Notes (Signed)
Physical Therapy Treatment ?Patient Details ?Name: Erin RichardSharon S Gebhardt ?MRN: [id number] ?DOB: March 16, 1966 ?Today's Date: 04/28/2022 ? ? ?History of Present Illness Pt is a 56 y/o F presenting [facility] on [facility]/2 with n/v and SOB. Found to have encephalopathy and seizure-like activity in the context of severe HTN. MRI shows small acute infarcts of left frontal white matter, hippocampus, and R pons. Pt in the ICU to monitor her tachycardia, HTN, and seizure activity.  CT head negative. PMH of HTN, DM, Anxiety, GERD, and recent heart cath on 4/20. ? ?  ?PT Comments  ? ? Patient highly motivated and asking to attempt tasks by herself (but allowing close guarding and assist as needed). Reinforced that she should not be transferring by herself as she is at high risk for fall and she verbalized understanding. Was able to ambulate 18 ft using hallway rail on her right and min assist (with second person to follow with chair). Making good progress and continues to be excellent candidate for AIR.  ?   ?Recommendations for follow up therapy are one component of a multi-disciplinary discharge planning process, led by the attending physician.  Recommendations may be updated based on patient status, additional functional criteria and insurance authorization. ? ?Follow Up Recommendations ? Acute inpatient rehab (3hours/day) ?  ?  ?Assistance Recommended at Discharge Frequent or constant Supervision/Assistance  ?Patient can return home with the following A lot of help with bathing/dressing/bathroom;Assistance with cooking/housework;Direct supervision/assist for medications management;Direct supervision/assist for financial management;Assist for transportation;Help with stairs or ramp for entrance;Two people to help with walking and/or transfers ?  ?Equipment Recommendations ? Rolling walker (2 wheels);BSC/3in1  ?  ?Recommendations for Other Services   ? ? ?  ?Precautions / Restrictions Precautions ?Precautions: Fall ?Precaution Comments: per Ralene Batheracy  Shuford, PA, no limitations to L shoulder s/p rotator cuff repair in March ?Restrictions ?Weight Bearing Restrictions: No  ?  ? ?Mobility ? Bed Mobility ?Overal bed mobility: Needs Assistance ?Bed Mobility: Supine to Sit ?  ?  ?Supine to sit: HOB elevated, Min guard ?  ?  ?General bed mobility comments: pt using rail and determined to get to EOB without physical assist (although wanted HOB elevated); anticipate would have required min-mod assist if bed flat and no rail ?  ? ?Transfers ?Overall transfer level: Needs assistance ?Equipment used: 1 person hand held assist ?Transfers: Sit to/from Stand, Bed to chair/wheelchair/BSC ?Sit to Stand: Min assist ?  ?Step pivot transfers: Min assist ?  ?  ?  ?General transfer comment: step-pivot to her rt to [facility] and recliner (blocking left knee for safety without buckling detected); from recliner to rail in hall with incr time and cues for safe hand placement ?  ? ?Ambulation/Gait ?Ambulation/Gait assistance: Mod assist, +2 safety/equipment, Min assist (+2 for chair follow) ?Gait Distance (Feet): 18 Feet ?Assistive device:  (Pt utilizing railing on the R side in the hall) ?Gait Pattern/deviations: Knee hyperextension - left, Decreased weight shift to left, Trunk flexed, Narrow base of support, Decreased dorsiflexion - left, Step-through pattern, Step-to pattern, Decreased step length - right, Decreased stance time - left ?Gait velocity: Decreased ?  ?  ?General Gait Details: Pt with heavy lean on R railing in hallway. Initially leaning on entire rt forearm, but progressing to hand only on rail. PT on L side facilitating weight shift. Patient able to advance and place Left foot. Knee closely guarded during stance and noted knee hyperextension with weight bearing. VC's for step-by-step sequencing. HR max 130 bpm ? ? ?Stairs ?  ?  ?  ?  ?  ? ? ?  Wheelchair Mobility ?  ? ?Modified Rankin (Stroke Patients Only) ?Modified Rankin (Stroke Patients Only) ?Pre-Morbid Rankin Score: Slight  disability ?Modified Rankin: Moderately severe disability ? ? ?  ?Balance Overall balance assessment: Needs assistance ?Sitting-balance support: Feet supported, Single extremity supported ?Sitting balance-Leahy Scale: Fair ?Sitting balance - Comments: Requiring min guard to min A support EOB ?Postural control: Posterior lean ?Standing balance support: Single extremity supported ?Standing balance-Leahy Scale: Poor ?  ?  ?  ?  ?  ?  ?  ?  ?  ?  ?  ?  ?  ? ?  ?Cognition Arousal/Alertness: Awake/alert ?Behavior During Therapy: [facility] for tasks assessed/performed ?Overall Cognitive Status: Impaired/Different from baseline ?Area of Impairment: Problem solving, Awareness, Safety/judgement, Following commands, Attention ?  ?  ?  ?  ?  ?  ?  ?  ?  ?Current Attention Level: Selective ?  ?Following Commands: Follows one step commands with increased time ?Safety/Judgement: Decreased awareness of safety, Decreased awareness of deficits ?Awareness: Intellectual ?Problem Solving: Slow processing, Decreased initiation, Difficulty sequencing, Requires verbal cues ?General Comments: pt report baseline memory deficits, decided to get herself off BSC (moving towards her weak side) when staff did not come quickly enough ?  ?  ? ?  ?Exercises   ? ?  ?General Comments   ?  ?  ? ?Pertinent Vitals/Pain Pain Assessment ?Pain Assessment: Faces ?Faces Pain Scale: Hurts little more ?Pain Location: Lt knee and thigh with gait ?Pain Descriptors / Indicators: Grimacing, Guarding ?Pain Intervention(s): Limited activity within patient's tolerance, Monitored during session  ? ? ?Home Living   ?  ?  ?  ?  ?  ?  ?  ?  ?  ?   ?  ?Prior Function    ?  ?  ?   ? ?PT Goals (current goals can now be found in the care plan section) Acute Rehab PT Goals ?Patient Stated Goal: walk again ?PT Goal Formulation: With patient ?Time For Goal Achievement: 05/06/22 ?Potential to Achieve Goals: Fair ?Progress towards PT goals: Progressing toward goals ? ?  ?Frequency ? ? ?  Min 4X/week ? ? ? ?  ?PT Plan Frequency needs to be updated  ? ? ?Co-evaluation   ?  ?  ?  ?  ? ?  ?AM-PAC PT "6 Clicks" Mobility   ?Outcome Measure ? Help needed turning from your back to your side while in a flat bed without using bedrails?: A Little ?Help needed moving from lying on your back to sitting on the side of a flat bed without using bedrails?: A Lot ?Help needed moving to and from a bed to a chair (including a wheelchair)?: A Little ?Help needed standing up from a chair using your arms (e.g., wheelchair or bedside chair)?: A Little ?Help needed to walk in hospital room?: Total ?Help needed climbing 3-5 steps with a railing? : Total ?6 Click Score: 13 ? ?  ?End of Session Equipment Utilized During Treatment: Gait belt ?Activity Tolerance: Patient tolerated treatment well ?Patient left: in chair;with call bell/phone within reach;with chair alarm set ?Nurse Communication: Mobility status ?PT Visit Diagnosis: Unsteadiness on feet (R26.81);Dizziness and giddiness (R42);Muscle weakness (generalized) (M62.81) ?  ? ? ?Time: 4098-1191 ?PT Time Calculation (min) (ACUTE ONLY): 31 min ? ?Charges:  $Gait Training: 23-37 mins          ?          ? ? ?Jerolyn Center, PT ?Acute Rehabilitation Services  ?Pager [phone] ?Office [phone] ? ? ? ?  Scherrie November Mry Lamia ?04/28/2022, 10:33 AM ? ?

## 2022-04-28 NOTE — Progress Notes (Signed)
Inpatient Rehab Admissions Coordinator:  ? ?I received approval from insurance for CIR admit, but I do not have a bed available for this patient today. I will follow for timing of admit pending bed availability.  ? ?Estill Dooms, PT, DPT ?Admissions Coordinator ?[phone] ?04/28/22  ?10:15 AM ? ?

## 2022-04-28 NOTE — Progress Notes (Signed)
PROGRESS NOTE    Erin Jackson  [id number] DOB: [birth year] DOA: 04/20/2022 PCP: Sonia Side., FNP   Chief Complaint  Patient presents with   Shortness of Breath   Nausea   Weakness   Emesis    Brief Narrative:   56 y.o. female with history of hypertension, diabetes mellitus, anxiety, hyperlipidemia, CAD, hyperlipidemia, chronic systolic CHF with EF 35 to 40%, left femoral artery occlusion/PVD, atherosclerosis, tobacco abuse, hypertension, diabetes mellitus type 2, chronic pain syndrome, patient presents to ED secondary to left face numbness, dizziness, work-up significant for multifocal acute CVA, and hypertensive emergency on admission with blood pressure 239/16, she was admitted to ICU, for nicardipine drip, blood pressures currently controlled, transferred off ICU, she was transferred to [facility] 04/23/2022.   Assessment & Plan:   Principal Problem:   Hypertensive urgency Active Problems:   Cerebral embolism with cerebral infarction  Acute/Subacute L frontal, hippocampus, and R pons infarcts - right pontine infarct and punctate left hippocampus and frontal white matter infarcts, secondary to small vessel disease vs. cardioembolic source  - MRI  right pontine infarct and punctate left hippocampus and frontal white matter infarcts - 2D Echo EF 40 to 45%, no PFO -EEG normal, no seizures -Neurology recommends 30 days CardioNet monitoring to rule out A-fib -LDL of 102-hemoglobin A1c 6.8 -UDS positive for THC-currently on Plavix 75 mg oral daily( has aspirin allergy) -Seen By PT/OT, recommendation for CIR  Hypertensive encephalopathy -Did require  nicardipine drip initially -Encephalopathy has resolved, mentation back to baseline -Allow for permissive hypertension -Blood pressure is elevated, she will be resumed antihypertensive regimen gradually, continue with metoprolol 50 mg oral twice daily especially with her tachycardia.  Sinus tachycardia -Due to  deconditioning, and rebound tachycardia from holding beta-blockers initially is allowing for permissive hypertension, now she is being gradually resumed on her beta-blockers,.   Nontypical chest pain Patient with musculoskeletal chest pain, reproducible by palpation, is with complaints of neuropathic pain, this has resolved  after starting Lyrica.     Acute on chronic kidney disease -Creatinine peaked at 2.17, currently back to baseline at 1.65.  Type II diabetes mellitus -A1c 6.8 -Continue with insulin sliding scale -Started on Farxiga as well specially in the setting of CHF.  Hypomagnesemia Mg has normalized.    Chronic heart failure with reduced ejection fraction (EF 35-40%) Non-obstructive coronary artery disease Does not appear hypervolemic, holding home GDMT, can resume once clinically improved. Will need to monitor volume status daily.  -repeat EF this admission with improvement 40 to 45%, no PFO. -back on metoprolol , will increase uptitrate dose gradually and resume back on losartan as well given her low EF keeping in mind gradual resumption of meds to allow for permissive hypertension with normal BP in 5 to 7 days -started on Farxiga -Titrating her metoprolol,  -Darted on losartan  Hyperlipidemia -LDL is 102, she is on Lipitor 80 mg at home, started on Zetia as well    Peripheral vascular disease  During most recent hospitalization found to have severe arterial disease in L, mild disease in R. Per chart review was having claudication symptoms. Vascular consulted at that time, no role for emergent revascularization. Will re-start home meds. - Plavix 75mg  daily - Atorvastatin 80mg  daily     Hypokalemia - repleted  DVT prophylaxis: Heparin Code Status: Full Family Communication: none at bedside Disposition: CIR  Status is: Inpatient, she is awaiting bed at CIR. we will have bed available tomorrow.    Consultants:  PCCM Neurology  Subjective:  Ambulated with  PT in the hallway today, she denies any chest pain or shortness of breath currently Objective: Vitals:   04/28/22 0443 04/28/22 0500 04/28/22 0737 04/28/22 1208  BP: 111/63  125/79 110/86  Pulse: 89  (!) 101 100  Resp: 17  18 (!) 22  Temp: 98.7 F (37.1 C)  97.7 F (36.5 C) 97.8 F (36.6 C)  TempSrc:   Oral Oral  SpO2: 99%  99% 98%  Weight:  98.9 kg      Intake/Output Summary (Last 24 hours) at 04/28/2022 1506 Last data filed at 04/27/2022 2000 Gross per 24 hour  Intake 420 ml  Output 700 ml  Net -280 ml   Filed Weights   04/26/22 0559 04/27/22 0500 04/28/22 0500  Weight: 90.2 kg 98.9 kg 98.9 kg    Examination:  Awake Alert, Oriented X 3,, she is with left-sided weakness due to her CVA. Symmetrical Chest wall movement, Good air movement bilaterally, CTAB RRR,No Gallops,Rubs or new Murmurs, No Parasternal Heave +ve B.Sounds, Abd Soft, No tenderness, No rebound - guarding or rigidity. No Cyanosis, Clubbing or edema, No new Rash or bruise          Data Reviewed: I have personally reviewed following labs and imaging studies  CBC: Recent Labs  Lab 04/22/22 0144 04/27/22 0029  WBC 11.6* 11.3*  HGB 10.7* 10.8*  HCT 32.7* 34.2*  MCV 88.9 90.2  PLT 246 287    Basic Metabolic Panel: Recent Labs  Lab 04/22/22 0144 04/23/22 0154 04/24/22 0157 04/26/22 2234 04/27/22 0029  NA 139 137 137 131* 134*  K 3.0* 3.4* 4.8 4.6 4.7  CL 100 102 107 102 103  CO2 28 28 24  21* 21*  GLUCOSE 124* 140* 123* 123* 122*  BUN 25* 16 13 22* 22*  CREATININE 2.17* 1.46* 1.29* 1.70* 1.65*  CALCIUM 7.0* 7.6* 8.7* 8.7* 8.9  MG 3.3* 2.6*  --   --   --   PHOS 5.4*  --   --  3.1  --     GFR: Estimated Creatinine Clearance: 40.8 mL/min (A) (by C-G formula based on SCr of 1.65 mg/dL (H)).  Liver Function Tests: Recent Labs  Lab 04/26/22 2234  ALBUMIN 2.2*    CBG: Recent Labs  Lab 04/27/22 2054 04/28/22 0032 04/28/22 0445 04/28/22 0737 04/28/22 1206  GLUCAP 329* 224* 116*  137* 238*     Recent Results (from the past 240 hour(s))  Blood culture (routine x 2)     Status: None   Collection Time: 04/20/22  8:50 PM   Specimen: BLOOD LEFT FOREARM  Result Value Ref Range Status   Specimen Description BLOOD LEFT FOREARM  Final   Special Requests   Final    BOTTLES DRAWN AEROBIC ONLY Blood Culture results may not be optimal due to an inadequate volume of blood received in culture bottles   Culture   Final    NO GROWTH 5 DAYS Performed at [facility], [address] [place], [place] [address]    Report Status 04/25/2022 FINAL  Final  Blood culture (routine x 2)     Status: None   Collection Time: 04/21/22 12:47 AM   Specimen: BLOOD  Result Value Ref Range Status   Specimen Description BLOOD SITE NOT SPECIFIED  Final   Special Requests   Final    BOTTLES DRAWN AEROBIC AND ANAEROBIC Blood Culture adequate volume   Culture   Final    NO GROWTH 5  DAYS Performed at [facility], [place] [address], [place], [place] [address]    Report Status 04/26/2022 FINAL  Final  Resp Panel by RT-PCR (Flu A&B, Covid) Nasopharyngeal Swab     Status: None   Collection Time: 04/21/22 12:50 AM   Specimen: Nasopharyngeal Swab; Nasopharyngeal(NP) swabs in vial transport medium  Result Value Ref Range Status   SARS Coronavirus 2 by RT PCR NEGATIVE NEGATIVE Final    Comment: (NOTE) SARS-CoV-2 target nucleic acids are NOT DETECTED.  The SARS-CoV-2 RNA is generally detectable in upper respiratory specimens during the acute phase of infection. The lowest concentration of SARS-CoV-2 viral copies this assay can detect is 138 copies/mL. A negative result does not preclude SARS-Cov-2 infection and should not be used as the sole basis for treatment or other patient management decisions. A negative result may occur with  improper specimen collection/handling, submission of specimen other than nasopharyngeal swab, presence of viral mutation(s) within the areas targeted by this  assay, and inadequate number of viral copies(<138 copies/mL). A negative result must be combined with clinical observations, patient history, and epidemiological information. The expected result is Negative.  Fact Sheet for Patients:  EntrepreneurPulse.com.au  Fact Sheet for Healthcare Providers:  IncredibleEmployment.be  This test is no t yet approved or cleared by the [place] FDA and  has been authorized for detection and/or diagnosis of SARS-CoV-2 by FDA under an Emergency Use Authorization (EUA). This EUA will remain  in effect (meaning this test can be used) for the duration of the COVID-19 declaration under Section 564(b)(1) of the Act, 21 U.S.C.section 360bbb-3(b)(1), unless the authorization is terminated  or revoked sooner.       Influenza A by PCR NEGATIVE NEGATIVE Final   Influenza B by PCR NEGATIVE NEGATIVE Final    Comment: (NOTE) The Xpert Xpress SARS-CoV-2/FLU/RSV plus assay is intended as an aid in the diagnosis of influenza from Nasopharyngeal swab specimens and should not be used as a sole basis for treatment. Nasal washings and aspirates are unacceptable for Xpert Xpress SARS-CoV-2/FLU/RSV testing.  Fact Sheet for Patients: EntrepreneurPulse.com.au  Fact Sheet for Healthcare Providers: IncredibleEmployment.be  This test is not yet approved or cleared by the [place] FDA and has been authorized for detection and/or diagnosis of SARS-CoV-2 by FDA under an Emergency Use Authorization (EUA). This EUA will remain in effect (meaning this test can be used) for the duration of the COVID-19 declaration under Section 564(b)(1) of the Act, 21 U.S.C. section 360bbb-3(b)(1), unless the authorization is terminated or revoked.  Performed at [facility], [place] [address], [place], [place] [address]   MRSA Next Gen by PCR, Nasal     Status: None   Collection Time: 04/21/22  3:28 AM    Specimen: Nasal Mucosa; Nasal Swab  Result Value Ref Range Status   MRSA by PCR Next Gen NOT DETECTED NOT DETECTED Final    Comment: (NOTE) The GeneXpert MRSA Assay (FDA approved for NASAL specimens only), is one component of a comprehensive MRSA colonization surveillance program. It is not intended to diagnose MRSA infection nor to guide or monitor treatment for MRSA infections. Test performance is not FDA approved in patients less than 67 years old. Performed at [facility], [place] [address] [place], [place] [address]          Radiology Studies: No results found.      Scheduled Meds:  atorvastatin  80 mg Oral Daily   clopidogrel  75 mg Oral Daily  dapagliflozin propanediol  5 mg Oral Daily   ezetimibe  10 mg Oral Daily   heparin  5,000 Units Subcutaneous Q8H   insulin aspart  0-15 Units Subcutaneous Q4H   metoprolol tartrate  50 mg Oral BID   pantoprazole  40 mg Oral Daily   pregabalin  50 mg Oral Daily   traZODone  150 mg Oral QHS   Continuous Infusions:  sodium chloride Stopped (04/22/22 1406)   promethazine (PHENERGAN) injection (IM or IVPB) Stopped (04/22/22 1355)     LOS: 7 days       Phillips Climes, MD [facility]   To contact the attending provider between 7A-7P or the covering provider during after hours 7P-7A, please log into the web site [email or web address] and access using universal [place] password for that web site. If you do not have the password, please call the hospital operator.  04/28/2022, 3:06 PM

## 2022-04-28 NOTE — Progress Notes (Signed)
Inpatient Diabetes Program Recommendations ? ?AACE/ADA: New Consensus Statement on Inpatient Glycemic Control  ? ?Target Ranges:  Prepandial:   less than 140 mg/dL ?     Peak postprandial:   less than 180 mg/dL (1-2 hours) ?     Critically ill patients:  140 - 180 mg/dL  ? ? Latest Reference Range & Units 04/28/22 00:32 04/28/22 04:45 04/28/22 07:37 04/28/22 12:06  ?Glucose-Capillary 70 - 99 mg/dL 573 (H) 220 (H) 254 (H) 238 (H)  ? ? Latest Reference Range & Units 04/27/22 07:47 04/27/22 12:05 04/27/22 16:06 04/27/22 20:54  ?Glucose-Capillary 70 - 99 mg/dL 270 (H) 623 (H) 762 (H) 329 (H)  ? ?Review of Glycemic Control ? ?Diabetes history: DM2 ?Outpatient Diabetes medications: Metformin 500 mg BID; Prednisone dose pack - was on 2nd day ?Current orders for Inpatient glycemic control: Novolog 0-15 units Q4H, Farxiga 5 mg daily ? ?Inpatient Diabetes Program Recommendations:   ? ?Insulin: May want to consider changing CBGs to AC&HS and Novolog 0-15 units to AC&HS. Please consider ordering Novolog 3 units TID with meals for meal coverage if patient eats at least 50% of meals. ? ?Thanks, ?Orlando Penner, RN, MSN, CDE ?Diabetes Coordinator ?Inpatient Diabetes Program ?[phone] (Team Pager from 8am to 5pm) ? ? ?

## 2022-04-29 ENCOUNTER — Other Ambulatory Visit: Payer: Self-pay

## 2022-04-29 ENCOUNTER — Inpatient Hospital Stay (HOSPITAL_COMMUNITY)
Admission: RE | Admit: 2022-04-29 | Discharge: 2022-05-08 | DRG: 057 | Disposition: A | Discharge status: Home Health | Payer: Medicare Other | Source: Intra-hospital | Attending: Physical Medicine and Rehabilitation | Admitting: Physical Medicine and Rehabilitation

## 2022-04-29 ENCOUNTER — Encounter (HOSPITAL_COMMUNITY): Payer: Self-pay | Admitting: Physical Medicine and Rehabilitation

## 2022-04-29 DIAGNOSIS — I255 Ischemic cardiomyopathy: Secondary | ICD-10-CM | POA: Diagnosis present

## 2022-04-29 DIAGNOSIS — I639 Cerebral infarction, unspecified: Secondary | ICD-10-CM | POA: Diagnosis present

## 2022-04-29 DIAGNOSIS — Z7902 Long term (current) use of antithrombotics/antiplatelets: Secondary | ICD-10-CM

## 2022-04-29 DIAGNOSIS — I130 Hypertensive heart and chronic kidney disease with heart failure and stage 1 through stage 4 chronic kidney disease, or unspecified chronic kidney disease: Secondary | ICD-10-CM | POA: Diagnosis present

## 2022-04-29 DIAGNOSIS — Z713 Dietary counseling and surveillance: Secondary | ICD-10-CM

## 2022-04-29 DIAGNOSIS — Z888 Allergy status to other drugs, medicaments and biological substances status: Secondary | ICD-10-CM

## 2022-04-29 DIAGNOSIS — Z91010 Allergy to peanuts: Secondary | ICD-10-CM

## 2022-04-29 DIAGNOSIS — E1151 Type 2 diabetes mellitus with diabetic peripheral angiopathy without gangrene: Secondary | ICD-10-CM | POA: Diagnosis present

## 2022-04-29 DIAGNOSIS — Z79899 Other long term (current) drug therapy: Secondary | ICD-10-CM | POA: Diagnosis not present

## 2022-04-29 DIAGNOSIS — I5022 Chronic systolic (congestive) heart failure: Secondary | ICD-10-CM | POA: Diagnosis present

## 2022-04-29 DIAGNOSIS — E1122 Type 2 diabetes mellitus with diabetic chronic kidney disease: Secondary | ICD-10-CM | POA: Diagnosis present

## 2022-04-29 DIAGNOSIS — E739 Lactose intolerance, unspecified: Secondary | ICD-10-CM | POA: Diagnosis present

## 2022-04-29 DIAGNOSIS — M542 Cervicalgia: Secondary | ICD-10-CM | POA: Diagnosis present

## 2022-04-29 DIAGNOSIS — I63119 Cerebral infarction due to embolism of unspecified vertebral artery: Secondary | ICD-10-CM

## 2022-04-29 DIAGNOSIS — E669 Obesity, unspecified: Secondary | ICD-10-CM | POA: Diagnosis present

## 2022-04-29 DIAGNOSIS — I6310 Cerebral infarction due to embolism of unspecified precerebral artery: Secondary | ICD-10-CM | POA: Diagnosis not present

## 2022-04-29 DIAGNOSIS — G894 Chronic pain syndrome: Secondary | ICD-10-CM | POA: Diagnosis present

## 2022-04-29 DIAGNOSIS — Z886 Allergy status to analgesic agent status: Secondary | ICD-10-CM

## 2022-04-29 DIAGNOSIS — K219 Gastro-esophageal reflux disease without esophagitis: Secondary | ICD-10-CM | POA: Diagnosis present

## 2022-04-29 DIAGNOSIS — D649 Anemia, unspecified: Secondary | ICD-10-CM | POA: Diagnosis present

## 2022-04-29 DIAGNOSIS — E1140 Type 2 diabetes mellitus with diabetic neuropathy, unspecified: Secondary | ICD-10-CM | POA: Diagnosis present

## 2022-04-29 DIAGNOSIS — F419 Anxiety disorder, unspecified: Secondary | ICD-10-CM | POA: Diagnosis present

## 2022-04-29 DIAGNOSIS — F319 Bipolar disorder, unspecified: Secondary | ICD-10-CM | POA: Diagnosis present

## 2022-04-29 DIAGNOSIS — I10 Essential (primary) hypertension: Secondary | ICD-10-CM | POA: Diagnosis not present

## 2022-04-29 DIAGNOSIS — R944 Abnormal results of kidney function studies: Secondary | ICD-10-CM | POA: Diagnosis present

## 2022-04-29 DIAGNOSIS — R5381 Other malaise: Secondary | ICD-10-CM | POA: Diagnosis present

## 2022-04-29 DIAGNOSIS — Z6836 Body mass index (BMI) 36.0-36.9, adult: Secondary | ICD-10-CM

## 2022-04-29 DIAGNOSIS — I2510 Atherosclerotic heart disease of native coronary artery without angina pectoris: Secondary | ICD-10-CM | POA: Diagnosis present

## 2022-04-29 DIAGNOSIS — I6312 Cerebral infarction due to embolism of basilar artery: Secondary | ICD-10-CM | POA: Diagnosis not present

## 2022-04-29 DIAGNOSIS — F5101 Primary insomnia: Secondary | ICD-10-CM | POA: Diagnosis not present

## 2022-04-29 DIAGNOSIS — N3941 Urge incontinence: Secondary | ICD-10-CM | POA: Diagnosis not present

## 2022-04-29 DIAGNOSIS — N1832 Chronic kidney disease, stage 3b: Secondary | ICD-10-CM | POA: Diagnosis present

## 2022-04-29 DIAGNOSIS — E785 Hyperlipidemia, unspecified: Secondary | ICD-10-CM | POA: Diagnosis present

## 2022-04-29 DIAGNOSIS — Z62810 Personal history of physical and sexual abuse in childhood: Secondary | ICD-10-CM | POA: Diagnosis present

## 2022-04-29 DIAGNOSIS — L299 Pruritus, unspecified: Secondary | ICD-10-CM | POA: Diagnosis present

## 2022-04-29 DIAGNOSIS — Z91018 Allergy to other foods: Secondary | ICD-10-CM | POA: Diagnosis not present

## 2022-04-29 DIAGNOSIS — Z91119 Patient's noncompliance with dietary regimen due to unspecified reason: Secondary | ICD-10-CM

## 2022-04-29 DIAGNOSIS — F17210 Nicotine dependence, cigarettes, uncomplicated: Secondary | ICD-10-CM | POA: Diagnosis present

## 2022-04-29 DIAGNOSIS — M1811 Unilateral primary osteoarthritis of first carpometacarpal joint, right hand: Secondary | ICD-10-CM | POA: Diagnosis present

## 2022-04-29 DIAGNOSIS — E559 Vitamin D deficiency, unspecified: Secondary | ICD-10-CM | POA: Diagnosis present

## 2022-04-29 DIAGNOSIS — Z7984 Long term (current) use of oral hypoglycemic drugs: Secondary | ICD-10-CM

## 2022-04-29 DIAGNOSIS — M79604 Pain in right leg: Secondary | ICD-10-CM | POA: Diagnosis present

## 2022-04-29 DIAGNOSIS — E119 Type 2 diabetes mellitus without complications: Secondary | ICD-10-CM

## 2022-04-29 DIAGNOSIS — M549 Dorsalgia, unspecified: Secondary | ICD-10-CM | POA: Diagnosis present

## 2022-04-29 DIAGNOSIS — Z8659 Personal history of other mental and behavioral disorders: Secondary | ICD-10-CM | POA: Diagnosis not present

## 2022-04-29 DIAGNOSIS — E1169 Type 2 diabetes mellitus with other specified complication: Secondary | ICD-10-CM

## 2022-04-29 DIAGNOSIS — G4700 Insomnia, unspecified: Secondary | ICD-10-CM | POA: Diagnosis not present

## 2022-04-29 DIAGNOSIS — Z90710 Acquired absence of both cervix and uterus: Secondary | ICD-10-CM

## 2022-04-29 DIAGNOSIS — I69398 Other sequelae of cerebral infarction: Secondary | ICD-10-CM | POA: Diagnosis present

## 2022-04-29 DIAGNOSIS — Z803 Family history of malignant neoplasm of breast: Secondary | ICD-10-CM

## 2022-04-29 DIAGNOSIS — Z741 Need for assistance with personal care: Secondary | ICD-10-CM | POA: Diagnosis present

## 2022-04-29 DIAGNOSIS — R32 Unspecified urinary incontinence: Secondary | ICD-10-CM | POA: Diagnosis not present

## 2022-04-29 LAB — GLUCOSE, CAPILLARY
Glucose-Capillary: 138 mg/dL — ABNORMAL HIGH (ref 70–99)
Glucose-Capillary: 205 mg/dL — ABNORMAL HIGH (ref 70–99)
Glucose-Capillary: 218 mg/dL — ABNORMAL HIGH (ref 70–99)
Glucose-Capillary: 243 mg/dL — ABNORMAL HIGH (ref 70–99)
Glucose-Capillary: 254 mg/dL — ABNORMAL HIGH (ref 70–99)
Glucose-Capillary: 301 mg/dL — ABNORMAL HIGH (ref 70–99)

## 2022-04-29 MED ORDER — GUAIFENESIN-DM 100-10 MG/5ML PO SYRP: 5.0000 mL | ORAL_SOLUTION | Freq: Four times a day (QID) | ORAL | Status: DC | PRN

## 2022-04-29 MED ORDER — EZETIMIBE 10 MG PO TABS: 10.0000 mg | ORAL_TABLET | Freq: Every day | ORAL | Status: DC

## 2022-04-29 MED ORDER — SENNOSIDES-DOCUSATE SODIUM 8.6-50 MG PO TABS: 1.0000 | ORAL_TABLET | Freq: Every evening | ORAL | Status: DC | PRN

## 2022-04-29 MED ORDER — INSULIN ASPART 100 UNIT/ML IJ SOLN
0.0000 [IU] | INTRAMUSCULAR | Status: DC
  Administered 2022-04-29: 2 [IU] via SUBCUTANEOUS
  Administered 2022-04-29: 11 [IU] via SUBCUTANEOUS
  Administered 2022-04-29 – 2022-04-30 (×2): 8 [IU] via SUBCUTANEOUS

## 2022-04-29 MED ORDER — HEPARIN SODIUM (PORCINE) 5000 UNIT/ML IJ SOLN: 5000.0000 [IU] | Freq: Three times a day (TID) | INTRAMUSCULAR | Status: DC

## 2022-04-29 MED ORDER — ACETAMINOPHEN 325 MG PO TABS
650.0000 mg | ORAL_TABLET | ORAL | Status: DC | PRN
Start: 2022-04-29 — End: 2022-05-08
  Administered 2022-04-29 – 2022-05-07 (×7): 650 mg via ORAL
  Filled 2022-04-29 (×7): qty 2

## 2022-04-29 MED ORDER — TRAZODONE HCL 50 MG PO TABS
150.0000 mg | ORAL_TABLET | Freq: Every day | ORAL | Status: DC
  Administered 2022-04-29 – 2022-05-07 (×9): 150 mg via ORAL
  Filled 2022-04-29 (×9): qty 3

## 2022-04-29 MED ORDER — ALPRAZOLAM 0.25 MG PO TABS
0.2500 mg | ORAL_TABLET | Freq: Two times a day (BID) | ORAL | Status: DC | PRN
  Administered 2022-04-29 – 2022-04-30 (×2): 0.25 mg via ORAL
  Filled 2022-04-29 (×2): qty 1

## 2022-04-29 MED ORDER — ALUM & MAG HYDROXIDE-SIMETH 200-200-20 MG/5ML PO SUSP: 30.0000 mL | ORAL | Status: DC | PRN

## 2022-04-29 MED ORDER — EZETIMIBE 10 MG PO TABS
10.0000 mg | ORAL_TABLET | Freq: Every day | ORAL | Status: DC
  Administered 2022-04-30 – 2022-05-08 (×9): 10 mg via ORAL
  Filled 2022-04-29 (×9): qty 1

## 2022-04-29 MED ORDER — PANTOPRAZOLE SODIUM 40 MG PO TBEC
40.0000 mg | DELAYED_RELEASE_TABLET | Freq: Every day | ORAL | Status: DC
  Administered 2022-04-30 – 2022-05-08 (×9): 40 mg via ORAL
  Filled 2022-04-29 (×9): qty 1

## 2022-04-29 MED ORDER — PREGABALIN 50 MG PO CAPS
50.0000 mg | ORAL_CAPSULE | Freq: Every day | ORAL | Status: DC
  Administered 2022-04-30 – 2022-05-08 (×8): 50 mg via ORAL
  Filled 2022-04-29 (×9): qty 1

## 2022-04-29 MED ORDER — DIPHENHYDRAMINE HCL 12.5 MG/5ML PO ELIX
12.5000 mg | ORAL_SOLUTION | Freq: Four times a day (QID) | ORAL | Status: DC | PRN
  Administered 2022-04-29 – 2022-05-08 (×15): 12.5 mg via ORAL
  Filled 2022-04-29: qty 0
  Filled 2022-04-29 (×16): qty 10

## 2022-04-29 MED ORDER — HEPARIN SODIUM (PORCINE) 5000 UNIT/ML IJ SOLN
5000.0000 [IU] | Freq: Three times a day (TID) | INTRAMUSCULAR | Status: DC
  Administered 2022-04-29 – 2022-05-04 (×14): 5000 [IU] via SUBCUTANEOUS
  Filled 2022-04-29 (×15): qty 1

## 2022-04-29 MED ORDER — ATORVASTATIN CALCIUM 80 MG PO TABS
80.0000 mg | ORAL_TABLET | Freq: Every day | ORAL | Status: DC
  Administered 2022-04-30 – 2022-05-08 (×9): 80 mg via ORAL
  Filled 2022-04-29 (×9): qty 1

## 2022-04-29 MED ORDER — PROCHLORPERAZINE EDISYLATE 10 MG/2ML IJ SOLN: 5.0000 mg | Freq: Four times a day (QID) | INTRAMUSCULAR | Status: DC | PRN

## 2022-04-29 MED ORDER — PROCHLORPERAZINE MALEATE 5 MG PO TABS
5.0000 mg | ORAL_TABLET | Freq: Four times a day (QID) | ORAL | Status: DC | PRN
  Administered 2022-04-30: 10 mg via ORAL
  Filled 2022-04-29: qty 2

## 2022-04-29 MED ORDER — METOPROLOL TARTRATE 50 MG PO TABS
50.0000 mg | ORAL_TABLET | Freq: Two times a day (BID) | ORAL | Status: DC
  Administered 2022-04-29 – 2022-05-03 (×8): 50 mg via ORAL
  Filled 2022-04-29 (×8): qty 1

## 2022-04-29 MED ORDER — BLOOD PRESSURE CONTROL BOOK
Freq: Once | Status: AC
  Filled 2022-04-29: qty 1

## 2022-04-29 MED ORDER — PROCHLORPERAZINE 25 MG RE SUPP: 12.5000 mg | Freq: Four times a day (QID) | RECTAL | Status: DC | PRN

## 2022-04-29 MED ORDER — SORBITOL 70 % SOLN: 30.0000 mL | Freq: Every day | Status: DC | PRN

## 2022-04-29 MED ORDER — LIVING WELL WITH DIABETES BOOK
Freq: Once | Status: AC
  Filled 2022-04-29: qty 1

## 2022-04-29 MED ORDER — METHOCARBAMOL 500 MG PO TABS
500.0000 mg | ORAL_TABLET | Freq: Four times a day (QID) | ORAL | Status: DC | PRN
  Administered 2022-05-04 – 2022-05-07 (×3): 500 mg via ORAL
  Filled 2022-04-29 (×3): qty 1

## 2022-04-29 MED ORDER — CLOPIDOGREL BISULFATE 75 MG PO TABS
75.0000 mg | ORAL_TABLET | Freq: Every day | ORAL | Status: DC
  Administered 2022-04-30 – 2022-05-08 (×9): 75 mg via ORAL
  Filled 2022-04-29 (×9): qty 1

## 2022-04-29 MED ORDER — DAPAGLIFLOZIN PROPANEDIOL 5 MG PO TABS
5.0000 mg | ORAL_TABLET | Freq: Every day | ORAL | Status: DC
Start: 2022-04-30 — End: 2022-05-08

## 2022-04-29 MED ORDER — FLEET ENEMA 7-19 GM/118ML RE ENEM: 1.0000 | ENEMA | Freq: Once | RECTAL | Status: DC | PRN

## 2022-04-29 MED ORDER — DAPAGLIFLOZIN PROPANEDIOL 5 MG PO TABS
5.0000 mg | ORAL_TABLET | Freq: Every day | ORAL | Status: DC
  Administered 2022-04-30: 5 mg via ORAL
  Filled 2022-04-29 (×2): qty 1

## 2022-04-29 NOTE — TOC Transition Note (Signed)
Transition of Care (TOC) - CM/SW Discharge Note ? ? ?Patient Details  ?Name: Erin Jackson ?MRN: [id number] ?Date of Birth: 12/03/1966 ? ?Transition of Care (TOC) CM/SW Contact:  ?Harriet Masson, RN ?Phone Number: ?04/29/2022, 9:50 AM ? ? ?Clinical Narrative:    ?Patient stable for discharge to [facility] inpatient rehab ? ?Final next level of care: IP Rehab Facility ?Barriers to Discharge: Barriers Resolved ? ? ?Patient Goals and CMS Choice ?Patient states their goals for this hospitalization and ongoing recovery are:: CIR ?  ?  ? ?Discharge Placement ?  ?           ? CIR ?  ?  ?  ? ?Discharge Plan and Services ?  ?  ?           ? CIR ?  ?  ?  ?  ?  ?  ?  ?  ?  ? ?Social Determinants of Health (SDOH) Interventions ?  ? ? ?Readmission Risk Interventions ?   ? View : No data to display.  ?  ?  ?  ? ? ? ? ? ?

## 2022-04-29 NOTE — PMR Pre-admission (Signed)
PMR Admission Coordinator Pre-Admission Assessment  Patient: Erin Jackson is an 56 y.o., female MRN: [id number] DOB: 1966/03/07 Height:   Weight: 94.6 kg  Insurance Information HMO: yes    PPO:      PCP:      IPA:      80/20:      OTHER:  PRIMARY: [employer] Medicare      Policy#: [id number]      Subscriber: Erin Jackson      Phone#: [phone]     Fax#: [phone] Pre-Cert#: [id number] [place] for CIR from [place] with Bernadene Bell, updates due to fax listed above on 5/16     Employer:  Benefits:  Phone #: [phone]     Name:  Eff. Date: 12/21/21     Deduct: $0      Out of Pocket Max: $8300 (met $2400.14)      Life Max: n/a CIR: $1556/admit      SNF: 20 full days Outpatient: 80%     Co-Ins: 20% Home Health: 100%      Co-Pay:  DME: 80%     Co-Ins: 20% Providers:  SECONDARY: medicaid [place] access      Policy#: [id number]     Phone#: [phone]  Financial Counselor:       Phone#:   The "Data Collection Information Summary" for patients in Inpatient Rehabilitation Facilities with attached "Privacy Act [place] Records" was provided and verbally reviewed with: Patient and Family  Emergency Contact Information Contact Information     Name Relation Home Work Mobile   Erin Jackson [phone]  [phone]   Erin Jackson   [phone]   Erin Jackson,Erin Jackson Sister   [phone]   Erin Jackson   [phone]       Current Medical History  Patient Admitting Diagnosis: R pontine, L hippocampus CVA  History of Present Illness: Erin is a 56 y/o female with PMH of HTN, DM, anxiety, chronic systolic CHF (EF 06-26%), PVD, atherosclerosis, tobacco abuse, chronic pain syndrome presented to [facility] on 5/1 with L facial numbness, dizziness, N/V, SOB, dysuria, HA, and abdominal pain.  In ED: BP progressed to 239/116, tachypnic and tacchycardic, UDS positive for thc, CTA chest/abd/pelvis negative, CT head negative.  Started on cardene and admitted by [facility].  MRI  completed and showed acute/subacute L frontal, hippocampus, and R pons infarcts.  Suspect due to small vessel disease vs cardioembolic source (neuro recommended 30 day even monitor).  Hgb A1C 6.8.  Erin reporting musculoskeletal chest pain, reproducible by palpation which has resolved with lyrica.  During recent hospitalization she was found to have severe arterial disease in L and mild in R with claudication symptoms.  Vascular consulted but no role for emergent revascularization.  Therapy ongoing and recommendations are for CIR.   Complete NIHSS TOTAL: 5  Patient's medical record from [facility] has been reviewed by the rehabilitation admission coordinator and physician.  Past Medical History  Past Medical History:  Diagnosis Date   Anxiety    Chronic back pain    Diabetes mellitus without complication (HCC)    GERD (gastroesophageal reflux disease)    Hypertension     Has the patient had major surgery during 100 days prior to admission? No  Family History   family history includes Breast cancer in her maternal aunt and maternal grandmother.  Current Medications  Current Facility-Administered Medications:    0.9 %  sodium chloride infusion, , Intravenous, PRN, Collier Bullock, MD, Stopped at 04/22/22 1406   acetaminophen (TYLENOL) tablet 650  mg, 650 mg, Oral, Q4H PRN, Frederik Pear, MD, 650 mg at 04/29/22 0345   ALPRAZolam (XANAX) tablet 0.25 mg, 0.25 mg, Oral, BID PRN, Sanjuan Dame, MD, 0.25 mg at 04/29/22 0911   alum & mag hydroxide-simeth (MAALOX/MYLANTA) 200-200-20 MG/5ML suspension 30 mL, 30 mL, Oral, Q6H PRN, Shalhoub, Sherryll Burger, MD, 30 mL at 04/25/22 0854   atorvastatin (LIPITOR) tablet 80 mg, 80 mg, Oral, Daily, Rosalin Hawking, MD, 80 mg at 04/29/22 1448   clopidogrel (PLAVIX) tablet 75 mg, 75 mg, Oral, Daily, Greta Doom, MD, 75 mg at 04/29/22 1856   dapagliflozin propanediol (FARXIGA) tablet 5 mg, 5 mg, Oral, Daily, Elgergawy, Silver Huguenin, MD, 5 mg at 04/29/22  0905   diphenhydrAMINE (BENADRYL) 12.5 MG/5ML elixir 12.5 mg, 12.5 mg, Oral, Q6H PRN, Elgergawy, Silver Huguenin, MD, 12.5 mg at 04/29/22 0345   docusate sodium (COLACE) capsule 100 mg, 100 mg, Oral, BID PRN, Shearon Stalls, Rahul P, PA-C, 100 mg at 04/27/22 2109   ezetimibe (ZETIA) tablet 10 mg, 10 mg, Oral, Daily, Rosalin Hawking, MD, 10 mg at 04/29/22 0909   heparin injection 5,000 Units, 5,000 Units, Subcutaneous, Q8H, Desai, Rahul P, PA-C, 5,000 Units at 04/29/22 0645   hydrALAZINE (APRESOLINE) injection 10 mg, 10 mg, Intravenous, Q6H PRN, Shalhoub, Sherryll Burger, MD   insulin aspart (novoLOG) injection 0-15 Units, 0-15 Units, Subcutaneous, Q4H, Desai, Rahul P, PA-C, 5 Units at 04/29/22 0905   LORazepam (ATIVAN) injection 1-2 mg, 1-2 mg, Intravenous, Q1H PRN, Kerney Elbe, MD, 1 mg at 04/25/22 1829   metoprolol tartrate (LOPRESSOR) injection 2.5-5 mg, 2.5-5 mg, Intravenous, Q3H PRN, Desai, Rahul P, PA-C, 5 mg at 04/25/22 1829   metoprolol tartrate (LOPRESSOR) tablet 50 mg, 50 mg, Oral, BID, Elgergawy, Silver Huguenin, MD, 50 mg at 04/29/22 0909   ondansetron (ZOFRAN) injection 4 mg, 4 mg, Intravenous, Q6H PRN, Desai, Rahul P, PA-C, 4 mg at 04/25/22 0854   pantoprazole (PROTONIX) EC tablet 40 mg, 40 mg, Oral, Daily, Shalhoub, Sherryll Burger, MD, 40 mg at 04/29/22 0909   polyethylene glycol (MIRALAX / GLYCOLAX) packet 17 g, 17 g, Oral, Daily PRN, Desai, Rahul P, PA-C   pregabalin (LYRICA) capsule 50 mg, 50 mg, Oral, Daily, Elgergawy, Silver Huguenin, MD, 50 mg at 04/29/22 0909   promethazine (PHENERGAN) 12.5 mg in sodium chloride 0.9 % 50 mL IVPB, 12.5 mg, Intravenous, Q6H PRN, Collier Bullock, MD, Stopped at 04/22/22 1355   traZODone (DESYREL) tablet 150 mg, 150 mg, Oral, QHS, Sanjuan Dame, MD, 150 mg at 04/28/22 2109  Patients Current Diet:  Diet Order             Diet - low sodium heart healthy           Diet Carb Modified           Diet heart healthy/carb modified Room service appropriate? No; Fluid consistency: Thin   Diet effective now                   Precautions / Restrictions Precautions Precautions: Fall Precaution Comments: per Jenetta Loges, PA, no limitations to L shoulder s/p rotator cuff repair in March Restrictions Weight Bearing Restrictions: No   Has the patient had 2 or more falls or a fall with injury in the past year? Yes  Prior Activity Level Limited Community (1-2x/wk): doesn't drive, no DME but reports could use one, family assists with B/D at baseline  Prior Functional Level Self Care: Did the patient need help bathing, dressing, using the toilet or  eating? Needed some help  Indoor Mobility: Did the patient need assistance with walking from room to room (with or without device)? Independent  Stairs: Did the patient need assistance with internal or external stairs (with or without device)? Needed some help  Functional Cognition: Did the patient need help planning regular tasks such as shopping or remembering to take medications? Needed some help  Patient Information Are you of Hispanic, Latino/a,or Spanish origin?: A. No, not of Hispanic, Latino/a, or Spanish origin What is your race?: B. Black or African American Do you need or want an interpreter to communicate with a doctor or health care staff?: 0. No  Patient's Response To:  Health Literacy and Transportation Is the patient able to respond to health literacy and transportation needs?: Yes Health Literacy - How often do you need to have someone help you when you read instructions, pamphlets, or other written material from your doctor or pharmacy?: Never In the past 12 months, has lack of transportation kept you from medical appointments or from getting medications?: No In the past 12 months, has lack of transportation kept you from meetings, work, or from getting things needed for daily living?: Yes  Home Assistive Devices / Equipment Home Equipment: None  Prior Device Use: Indicate devices/aids used by the  patient prior to current illness, exacerbation or injury? None of the above  Current Functional Level Cognition  Overall Cognitive Status: Impaired/Different from baseline Current Attention Level: Selective Orientation Level: Oriented X4 Following Commands: Follows one step commands with increased time Safety/Judgement: Decreased awareness of safety, Decreased awareness of deficits General Comments: Erin report baseline memory deficits, decided to get herself off BSC (moving towards her weak side) when staff did not come quickly enough    Extremity Assessment (includes Sensation/Coordination)  Upper Extremity Assessment: LUE deficits/detail RUE Deficits / Details: Requiring assist to flex biceps, does not activate triceps to control extending, unable to flex/abduct or go against gravity with shoulders due to weakness. RUE Coordination: decreased fine motor, decreased gross motor LUE Deficits / Details: moving fingers freely, 2+/5 wrist and forearm, increased pain in elbow and shoulder LUE: Shoulder pain with ROM LUE Sensation: decreased light touch, decreased proprioception LUE Coordination: decreased fine motor, decreased gross motor  Lower Extremity Assessment: Defer to Erin evaluation RLE Deficits / Details: limited antigravity control in open chain, no formal MMT, but Erin able to stand LLE Deficits / Details: Erin reports toe and plantar foot decreased sensation, limited antigravity control in open chain, no formal MMT, but Erin able to stand    ADLs  Overall ADL's : Needs assistance/impaired Eating/Feeding: Set up, Sitting Eating/Feeding Details (indicate cue type and reason): finger foods, cup with lid Grooming: Set up, Sitting, Wash/dry hands Upper Body Bathing: Maximal assistance, Sitting Lower Body Bathing: Maximal assistance, +2 for physical assistance, +2 for safety/equipment, Sitting/lateral leans, Sit to/from stand Upper Body Dressing : Minimal assistance, Sitting Upper Body  Dressing Details (indicate cue type and reason): to doff front opening gown Lower Body Dressing: Total assistance, Bed level Toilet Transfer: +2 for physical assistance, Minimal assistance, Stand-pivot, BSC/3in1 Toileting- Clothing Manipulation and Hygiene: Supervision/safety, Sitting/lateral lean Toileting - Clothing Manipulation Details (indicate cue type and reason): Erin with good thoroughness with pericare in sitting Functional mobility during ADLs: +2 for physical assistance, Moderate assistance General ADL Comments: Erin presents with global weakness, ataxia, dizziness, and visual deficits. Requiring increased assist for all tasks.    Mobility  Overal bed mobility: Needs Assistance Bed Mobility: Supine to Sit Supine to  sit: HOB elevated, Min guard Sit to supine: Mod assist, +2 for physical assistance, +2 for safety/equipment General bed mobility comments: Erin using rail and determined to get to EOB without physical assist (although wanted HOB elevated); anticipate would have required min-mod assist if bed flat and no rail    Transfers  Overall transfer level: Needs assistance Equipment used: 1 person hand held assist Transfers: Sit to/from Stand, Bed to chair/wheelchair/BSC Sit to Stand: Min assist Bed to/from chair/wheelchair/BSC transfer type:: Step pivot Stand pivot transfers: +2 physical assistance, Min assist Step pivot transfers: Min assist General transfer comment: step-pivot to her rt to [facility] and recliner (blocking left knee for safety without buckling detected); from recliner to rail in hall with incr time and cues for safe hand placement    Ambulation / Gait / Stairs / Wheelchair Mobility  Ambulation/Gait Ambulation/Gait assistance: Mod assist, +2 safety/equipment, Min assist (+2 for chair follow) Gait Distance (Feet): 18 Feet Assistive device:  (Erin utilizing railing on the R side in the hall) Gait Pattern/deviations: Knee hyperextension - left, Decreased weight shift to  left, Trunk flexed, Narrow base of support, Decreased dorsiflexion - left, Step-through pattern, Step-to pattern, Decreased step length - right, Decreased stance time - left General Gait Details: Erin with heavy lean on R railing in hallway. Initially leaning on entire rt forearm, but progressing to hand only on rail. Erin on L side facilitating weight shift. Patient able to advance and place Left foot. Knee closely guarded during stance and noted knee hyperextension with weight bearing. VC's for step-by-step sequencing. HR max 130 bpm Gait velocity: Decreased Gait velocity interpretation: <1.31 ft/sec, indicative of household ambulator Pre-gait activities: weight shifting EOB    Posture / Balance Dynamic Sitting Balance Sitting balance - Comments: Requiring min guard to min A support EOB Balance Overall balance assessment: Needs assistance Sitting-balance support: Feet supported, Single extremity supported Sitting balance-Leahy Scale: Fair Sitting balance - Comments: Requiring min guard to min A support EOB Postural control: Posterior lean Standing balance support: Single extremity supported Standing balance-Leahy Scale: Poor Standing balance comment: +2 assist required.    Special needs/care consideration Diabetic management yes   Previous Home Environment (from acute therapy documentation) Living Arrangements: Children Available Help at Discharge: Family, Available PRN/intermittently Type of Home: Apartment Home Layout: One level Home Access: Stairs to enter Entrance Stairs-Rails: None Entrance Stairs-Number of Steps: 3 Bathroom Shower/Tub: Chiropodist: Standard  Discharge Living Setting Plans for Discharge Living Setting: Patient's home, Lives with (comment) (Jackson) Type of Home at Discharge: Apartment Discharge Home Layout: One level Discharge Home Access: Stairs to enter Entrance Stairs-Rails: None Entrance Stairs-Number of Steps: 3 Discharge Bathroom  Shower/Tub: Tub/shower unit Discharge Bathroom Toilet: Standard Discharge Bathroom Accessibility: Yes How Accessible: Accessible via walker Does the patient have any problems obtaining your medications?: No  Social/Family/Support Systems Anticipated Caregiver: family: 2 sons [name] and [place]; 2 sisters Erin Jackson and [place] Anticipated Caregiver's Contact Information: [name] [phone] Miles [phone] Ability/Limitations of Caregiver: Jackson works night shift, plan to hire caregivers when family not available (will need list from [place]) Caregiver Availability: 24/7 Discharge Plan Discussed with Primary Caregiver: Yes Is Caregiver In Agreement with Plan?: Yes Does Caregiver/Family have Issues with Lodging/Transportation while Erin is in Rehab?: No  Goals Patient/Family Goal for Rehab: Erin/OT/SLP supervision to mod I Expected length of stay: 12-14 days Erin/Family Agrees to Admission and willing to participate: Yes Program Orientation Provided & Reviewed with Erin/Caregiver Including Roles  & Responsibilities: Yes  Barriers to Discharge: Insurance  for SNF coverage, Decreased caregiver support, Home environment access/layout  Decrease burden of Care through IP rehab admission: n/a  Possible need for SNF placement upon discharge: Not anticipated.  Erin/family aware of need for 24/7 supervision at discharge and planning to pull together family and hired caregivers.  They would like a list of agencies.  They are aware that SNF is not typically approved by insurance following CIR stay.   Patient Condition: I have reviewed medical records from [facility], spoken with CSW, and patient and Jackson. I met with patient at the bedside for inpatient rehabilitation assessment.  Patient will benefit from ongoing Erin, OT, and SLP, can actively participate in 3 hours of therapy a day 5 days of the week, and can make measurable gains during the admission.  Patient will also benefit from the coordinated team approach during  an Inpatient Acute Rehabilitation admission.  The patient will receive intensive therapy as well as Rehabilitation physician, nursing, social worker, and care management interventions.  Due to bladder management, bowel management, safety, skin/wound care, disease management, medication administration, pain management, and patient education the patient requires 24 hour a day rehabilitation nursing.  The patient is currently min to mod assist with mobility and basic ADLs.  Discharge setting and therapy post discharge at home with home health is anticipated.  Patient has agreed to participate in the Acute Inpatient Rehabilitation Program and will admit today.  Preadmission Screen Completed By:  Michel Santee, Erin, DPT 04/29/2022 10:36 AM ______________________________________________________________________   Discussed status with Dr. Curlene Dolphin on 04/29/22  at 10:50 AM  and received approval for admission today.  Admission Coordinator:  Michel Santee, Erin, DPT time 10:50 AM Sudie Grumbling 04/29/22    Assessment/Plan: Diagnosis: R pontine, L hippocampus CVA Does the need for close, 24 hr/day Medical supervision in concert with the patient's rehab needs make it unreasonable for this patient to be served in a less intensive setting? Yes Co-Morbidities requiring supervision/potential complications: HTN, DM, anxiety, chronic systolic CHF (EF 70-78%), PVD, atherosclerosis, tobacco abuse, chronic pain syndrome Due to bladder management, bowel management, safety, skin/wound care, disease management, medication administration, pain management, and patient education, does the patient require 24 hr/day rehab nursing? Yes Does the patient require coordinated care of a physician, rehab nurse, Erin, OT, and SLP to address physical and functional deficits in the context of the above medical diagnosis(es)? Yes Addressing deficits in the following areas: balance, endurance, locomotion, strength, transferring, bowel/bladder  control, bathing, dressing, feeding, grooming, toileting, cognition, speech, language, swallowing, and psychosocial support Can the patient actively participate in an intensive therapy program of at least 3 hrs of therapy 5 days a week? Yes The potential for patient to make measurable gains while on inpatient rehab is good Anticipated functional outcomes upon discharge from inpatient rehab: modified independent and supervision Erin, modified independent and supervision OT, modified independent and supervision SLP Estimated rehab length of stay to reach the above functional goals is: 12-14 days  Anticipated discharge destination: Home 10. Overall Rehab/Functional Prognosis: good   MD Signature: Jennye Boroughs MD
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
## 2022-04-29 NOTE — Discharge Summary (Signed)
PATIENT DETAILS Name: YARISBEL MIRANDA Age: 56 y.o. Sex: female Date of Birth: 21-Mar-1966 MRN: [id number]. Admitting Physician: No admitting provider for patient encounter. GYF:VCBSW, Malva Limes., FNP  Admit Date: 04/20/2022 Discharge date: 04/29/2022  Recommendations for Outpatient Follow-up:  Follow up with PCP in 1-2 weeks Please obtain CMP/CBC in one week Please ensure follow-up with neurology. Please ensure follow-up with cardiology for 30-day event monitor to rule out A-fib.  Admitted From:  Home  Disposition: CIR   Discharge Condition: fair  CODE STATUS:   Code Status: Full Code   Diet recommendation:  Diet Order             Diet - low sodium heart healthy           Diet Carb Modified           Diet heart healthy/carb modified Room service appropriate? No; Fluid consistency: Thin  Diet effective now                    Brief Summary: 56 year old with history of HTN, DM-2, HLD, CAD, chronic systolic heart failure, PAD-who presented with left facial numbness/left-sided weakness-she was found to have acute CVA and subsequently admitted for further evaluation and treatment.  Brief Hospital Course: Acute right pontine infarct/punctate left hippocampal/frontal white matter infarct: Likely due to small vessel disease.  MRA showed intracranial stenosis-moderate left M2 stenosis, carotid Doppler was unremarkable, echo with EF 40-45%, LDL 102, A1c 6.8.  Evaluated by neurology-continue Plavix and statin on discharge.  Plan is to discharge to CIR today.  Patient will require 30-day CardioNet monitoring to rule out A-fib post discharge from CIR.  Hypertensive encephalopathy: Initially admitted to the ICU-required nicardipine infusion.  Encephalopathy has resolved-BP now stable-continue metoprolol.  AKI on CKD stage IIIb: AKI likely hemodynamically mediated due to uncontrolled hypertension-has improved-creatinine back to baseline.  HLD: Continue Lipitor/Zetia on  discharge.  PAD: Continue Plavix/statin-follow-up with vascular surgery in the outpatient setting.  Recently evaluated by Dr. Luan Pulling on 4/19-not felt to require revascularization.  DM-2: CBG stable-continue Farxiga-resume metformin on discharge.  Follow CBGs and optimize accordingly.  Chronic HFrEF: Euvolemic-continue beta-blocker/losartan/Farxiga-follow-up with primary cardiologist for further optimization.  If BP tolerates-May need to be started on Entresto at some point.  Chronic pain syndrome: Continue Lyrica/Tylenol-has not been on narcotics since hospitalization-previously on xtampza  Obesity: Estimated body mass index is 38.15 kg/m as calculated from the following:   Height as of 04/08/22: _0  (1.575 m).   Weight as of this encounter: 94.6 kg.   Discharge Diagnoses:  Principal Problem:   Hypertensive urgency Active Problems:   Cerebral embolism with cerebral infarction   Discharge Instructions:  Activity:  As tolerated with Full fall precautions use walker/cane & assistance as needed   Discharge Instructions     Ambulatory referral to Neurology   Complete by: As directed    Follow up with stroke clinic NP (Jessica Vanschaick or Cecille Rubin, if both not available, consider Zachery Dauer, or Ahern) at [facility] in about 4 weeks. Thanks.   Diet - low sodium heart healthy   Complete by: As directed    Diet Carb Modified   Complete by: As directed    Discharge instructions   Complete by: As directed    Follow with Primary MD  Sonia Side., FNP in 1-2 weeks  Please get a complete blood count and chemistry panel checked by your Primary MD at your next visit, and again as instructed by your  Primary MD.  Get Medicines reviewed and adjusted: Please take all your medications with you for your next visit with your Primary MD  Laboratory/radiological data: Please request your Primary MD to go over all hospital tests and procedure/radiological results at the follow up,  please ask your Primary MD to get all Hospital records sent to his/her office.  In some cases, they will be blood work, cultures and biopsy results pending at the time of your discharge. Please request that your primary care M.D. follows up on these results.  Also Note the following: If you experience worsening of your admission symptoms, develop shortness of breath, life threatening emergency, suicidal or homicidal thoughts you must seek medical attention immediately by calling 911 or calling your MD immediately  if symptoms less severe.  You must read complete instructions/literature along with all the possible adverse reactions/side effects for all the Medicines you take and that have been prescribed to you. Take any new Medicines after you have completely understood and accpet all the possible adverse reactions/side effects.   Do not drive when taking Pain medications or sleeping medications (Benzodaizepines)  Do not take more than prescribed Pain, Sleep and Anxiety Medications. It is not advisable to combine anxiety,sleep and pain medications without talking with your primary care practitioner  Special Instructions: If you have smoked or chewed Tobacco  in the last 2 yrs please stop smoking, stop any regular Alcohol  and or any Recreational drug use.  Wear Seat belts while driving.  Please note: You were cared for by a hospitalist during your hospital stay. Once you are discharged, your primary care physician will handle any further medical issues. Please note that NO REFILLS for any discharge medications will be authorized once you are discharged, as it is imperative that you return to your primary care physician (or establish a relationship with a primary care physician if you do not have one) for your post hospital discharge needs so that they can reassess your need for medications and monitor your lab values.   Increase activity slowly   Complete by: As directed       Allergies as of  04/29/2022       Reactions   Citrus Shortness Of Breath   Grapefruit   Cucumber Extract Anaphylaxis   Molds & Smuts Shortness Of Breath   Norvasc [amlodipine] Other (See Comments)   Muscle became very tight   Other Anaphylaxis, Other (See Comments)   Tree nuts, walnuts, peanuts, pickles   Apple Juice Hives   Milk-related Compounds Diarrhea, Nausea And Vomiting   Lactose intolerant   Aspirin Rash   Ibuprofen Rash   Niacin And Related Rash        Medication List     STOP taking these medications    cloNIDine 0.2 MG tablet Commonly known as: CATAPRES   HYDROcodone-acetaminophen 10-325 MG tablet Commonly known as: NORCO   omeprazole 40 MG capsule Commonly known as: PRILOSEC   predniSONE 10 MG (21) Tbpk tablet Commonly known as: STERAPRED UNI-PAK 21 TAB   Xtampza ER 13.5 MG C12a Generic drug: oxyCODONE ER       TAKE these medications    acetaminophen 500 MG tablet Commonly known as: TYLENOL Take 500 mg by mouth every 6 (six) hours as needed for headache.   albuterol 108 (90 Base) MCG/ACT inhaler Commonly known as: VENTOLIN HFA Inhale 2 puffs into the lungs every 6 (six) hours as needed for wheezing or shortness of breath.   alprazolam 2 MG tablet  Commonly known as: XANAX Take 0.5 tablets (1 mg total) by mouth 2 (two) times daily as needed for anxiety.   atorvastatin 80 MG tablet Commonly known as: LIPITOR Take 1 tablet (80 mg total) by mouth daily.   cetirizine 10 MG tablet Commonly known as: ZyrTEC Allergy Take 1 tablet (10 mg total) by mouth daily.   clopidogrel 75 MG tablet Commonly known as: PLAVIX Take 1 tablet (75 mg total) by mouth daily.   dapagliflozin propanediol 5 MG Tabs tablet Commonly known as: FARXIGA Take 1 tablet (5 mg total) by mouth daily. Start taking on: Apr 30, 2022   EPINEPHrine 0.3 mg/0.3 mL Soaj injection Commonly known as: EPI-PEN Inject 0.3 mLs (0.3 mg total) into the muscle once as needed (allergic reaction). What  changed:  when to take this reasons to take this   ezetimibe 10 MG tablet Commonly known as: ZETIA Take 1 tablet (10 mg total) by mouth daily. Start taking on: Apr 30, 2022   losartan 25 MG tablet Commonly known as: COZAAR Take 1 tablet (25 mg total) by mouth daily.   metFORMIN 500 MG tablet Commonly known as: GLUCOPHAGE Take 1 tablet (500 mg total) by mouth 2 (two) times daily with a meal.   metoprolol tartrate 50 MG tablet Commonly known as: LOPRESSOR Take 1 tablet (50 mg total) by mouth 2 (two) times daily.   Narcan 4 MG/0.1ML Liqd nasal spray kit Generic drug: naloxone Place 0.4 mg into the nose as needed (opoid overdose).   nicotine 14 mg/24hr patch Commonly known as: NICODERM CQ - dosed in mg/24 hours Place 1 patch (14 mg total) onto the skin daily.   nitroGLYCERIN 0.4 MG SL tablet Commonly known as: NITROSTAT Place 1 tablet (0.4 mg total) under the tongue every 5 (five) minutes as needed for chest pain.   pantoprazole 40 MG tablet Commonly known as: PROTONIX Take 1 tablet (40 mg total) by mouth daily.   traZODone 150 MG tablet Commonly known as: DESYREL Take 150 mg by mouth at bedtime.        Follow-up Information     [facility]. Schedule an appointment as soon as possible for a visit in 1 month(s).   Specialty: Neurology Why: stroke clinic Contact information: [address] [place] [phone]        Sonia Side., FNP Follow up in 1 week(s).   Specialty: Family Medicine Contact information: [place] [address] [phone]                Allergies  Allergen Reactions   Citrus Shortness Of Breath    Grapefruit   Cucumber Extract Anaphylaxis   Molds & Smuts Shortness Of Breath   Norvasc [Amlodipine] Other (See Comments)    Muscle became very tight   Other Anaphylaxis and Other (See Comments)    Tree nuts, walnuts, peanuts, pickles   Apple Juice Hives    Milk-Related Compounds Diarrhea and Nausea And Vomiting    Lactose intolerant   Aspirin Rash   Ibuprofen Rash   Niacin And Related Rash     Other Procedures/Studies: DG Chest 1 View  Result Date: 04/14/2022 CLINICAL DATA:  Chest pain and itching. EXAM: CHEST  1 VIEW COMPARISON:  10/04/2018. FINDINGS: The heart is enlarged the mediastinal contour is within normal limits. Subsegmental atelectasis or scarring is noted in the mid left lung. No consolidation, effusion, or pneumothorax. Cervical spinal fusion hardware is noted. Degenerative changes are present  in the thoracic spine. IMPRESSION: 1. Mild subsegmental atelectasis or scarring in mid left lung. 2. Cardiomegaly. Electronically Signed   By: Brett Fairy M.D.   On: 04/14/2022 20:06   CT HEAD WO CONTRAST (5MM)  Result Date: 04/20/2022 CLINICAL DATA:  Nonspecific dizziness EXAM: CT HEAD WITHOUT CONTRAST TECHNIQUE: Contiguous axial images were obtained from the base of the skull through the vertex without intravenous contrast. RADIATION DOSE REDUCTION: This exam was performed according to the departmental dose-optimization program which includes automated exposure control, adjustment of the mA and/or kV according to patient size and/or use of iterative reconstruction technique. COMPARISON:  01/20/2017 FINDINGS: Brain: No acute infarct or hemorrhage. Lateral ventricles and midline structures are unremarkable. No acute extra-axial fluid collections. No mass effect. Vascular: No hyperdense vessel or unexpected calcification. Skull: Normal. Negative for fracture or focal lesion. Sinuses/Orbits: No acute finding. Other: None. IMPRESSION: 1. No acute intracranial process.  Stable exam. Electronically Signed   By: Randa Ngo M.D.   On: 04/20/2022 15:18   MR ANGIO HEAD WO CONTRAST  Result Date: 04/22/2022 CLINICAL DATA:  Provided history: Stroke, follow-up. EXAM: MRA HEAD WITHOUT CONTRAST TECHNIQUE: Angiographic images of the Circle of Willis were  acquired using MRA technique without intravenous contrast. COMPARISON:  Brain MRI 04/21/2022. FINDINGS: Anterior circulation: The intracranial internal carotid arteries are patent. Atherosclerotic irregularity of both vessels with no more than mild stenosis. The M1 middle cerebral arteries are patent. No M2 proximal branch occlusion is identified. Atherosclerotic irregularity of the M2 and more distal MCA vessels, bilaterally. Most notably, there is a moderate stenosis within a superior division mid-to-distal M2 left MCA vessel. The anterior cerebral arteries are patent. No intracranial aneurysm is identified. Posterior circulation: The intracranial vertebral arteries are patent. The right vertebral artery is dominant. The basilar artery is patent. The posterior cerebral arteries are patent. Mild atherosclerotic irregularity of both vessels. Sizable posterior communicating arteries with developmentally hypoplastic P1 segments, bilaterally. Anatomic variants: As described. IMPRESSION: No intracranial large vessel occlusion is identified. Intracranial atherosclerotic disease, as described. Most notably, there is a moderate focal stenosis within a superior division mid-to-distal M2 left MCA vessel. Electronically Signed   By: Kellie Simmering D.O.   On: 04/22/2022 15:43   MR BRAIN WO CONTRAST  Result Date: 04/21/2022 CLINICAL DATA:  Mental status change, unknown cause EXAM: MRI HEAD WITHOUT CONTRAST TECHNIQUE: Multiplanar, multiecho pulse sequences of the brain and surrounding structures were obtained without intravenous contrast. COMPARISON:  CT head Apr 20, 2022.  MRI head November 12, 2020. FINDINGS: Brain: Small acute or subacute infarcts in the left frontal white matter and left hippocampus. Mild edema without mass effect. No evidence of acute hemorrhage, mass lesion, midline shift, hydrocephalus, or extra-axial fluid collection. Additional mild T2/FLAIR hyperintensities within the white matter, nonspecific but  compatible with chronic microvascular ischemic disease. Partially empty sella. Vascular: Major arterial flow voids are maintained at the skull base. Skull and upper cervical spine: Normal marrow signal. Sinuses/Orbits: Clear sinuses.  No acute orbital findings. Other: No mastoid effusions. IMPRESSION: 1. Small acute or subacute infarcts in the left frontal white matter, left hippocampus, and right pons. Mild edema without mass effect. Given involvement of multiple vascular territories, consider embolic etiology. 2. Mild chronic microvascular ischemic disease. 3. Partially empty sella, which is often a normal anatomic variant but can be associated with idiopathic intracranial hypertension. Electronically Signed   By: Margaretha Sheffield M.D.   On: 04/21/2022 16:34   CARDIAC CATHETERIZATION  Result Date: 04/09/2022   Colon Flattery  LAD to Prox LAD lesion is 50% stenosed.   Mid Cx lesion is 40% stenosed.   Dist Cx lesion is 50% stenosed. Life style modification for mild to moderate multivessel disease.   DG Chest Port 1 View  Result Date: 04/22/2022 CLINICAL DATA:  Respiratory failure. EXAM: PORTABLE CHEST 1 VIEW COMPARISON:  04/20/2022 and CT chest 04/20/2022. FINDINGS: Trachea is midline. Heart size stable. Increasing bibasilar streaky and patchy airspace opacification. There may be a small left pleural effusion. IMPRESSION: 1. Increasing bibasilar atelectasis. Difficult to exclude developing pneumonia in the right lung base. 2. Possible small left pleural effusion. Electronically Signed   By: Lorin Picket M.D.   On: 04/22/2022 08:34   DG Chest Port 1 View  Result Date: 04/20/2022 CLINICAL DATA:  Pt poor historian. Per PA: Pt complains of shortness of breath. Participates minimally in history, vomiting (clear) in triage, upper abdominal discomfort. EXAM: PORTABLE CHEST - 1 VIEW COMPARISON:  04/14/2022 FINDINGS: Perihilar and bibasilar interstitial edema or infiltrates, worse on right than left, new since previous.  Heart size and mediastinal contours are within normal limits. No effusion. Cervical fixation hardware noted. IMPRESSION: New asymmetric perihilar and bibasilar infiltrates or edema, right worse than left. Electronically Signed   By: Lucrezia Europe M.D.   On: 04/20/2022 14:09   EEG adult  Result Date: 04/21/2022 Lora Havens, MD     04/21/2022 12:07 PM Patient Name: MAKYNLI STILLS MRN: [id number] Epilepsy Attending: Lora Havens Referring Physician/Provider: Kerney Elbe, MD Date: 04/21/2022 Duration: 22.10 mins Patient history: 56 year old female presenting with encephalopathy and seizure-like activity in the context of severe HTN.  EEG to evaluate for seizure. Level of alertness: Awake,asleep AEDs during EEG study: LEV Technical aspects: This EEG study was done with scalp electrodes positioned according to the 10-20 International system of electrode placement. Electrical activity was acquired at a sampling rate of _0  and reviewed with a high frequency filter of _1  and a low frequency filter of _2 . EEG data were recorded continuously and digitally stored. Description: The posterior dominant rhythm consists of 8-9 Hz activity of moderate voltage (25-35 uV) seen predominantly in posterior head regions, symmetric and reactive to eye opening and eye closing. Sleep was characterized by vertex waves, sleep spindles (12 to 14 Hz), maximal frontocentral region.  Hyperventilation and photic stimulation were not performed.   IMPRESSION: This study is within normal limits. No seizures or epileptiform discharges were seen throughout the recording. Priyanka O Yadav   VAS [place] ABI WITH/WO TBI  Result Date: 04/09/2022  LOWER EXTREMITY DOPPLER STUDY Patient Name:  ALISHEA BEAUDIN  Date of Exam:   04/08/2022 Medical Rec #: [id number]       Accession #:    [id number] Date of Birth: 07/12/1966       Patient Gender: F Patient Age:   81 years Exam Location:  [facility] Procedure:      VAS [place] ABI WITH/WO TBI Referring  Phys: Aldona Bar RHYNE --------------------------------------------------------------------------------  Indications: Femoral artery occlusion, left. High Risk Factors: Hypertension, Diabetes.  Comparison Study: no prior Performing Technologist: Archie Patten RVS  Examination Guidelines: A complete evaluation includes at minimum, Doppler waveform signals and systolic blood pressure reading at the level of bilateral brachial, anterior tibial, and posterior tibial arteries, when vessel segments are accessible. Bilateral testing is considered an integral part of a complete examination. Photoelectric Plethysmograph (PPG) waveforms and toe systolic pressure readings are included as required and additional duplex testing as needed. Limited examinations for  reoccurring indications may be performed as noted.  ABI Findings: +---------+------------------+-----+---------+--------+ Right    Rt Pressure (mmHg)IndexWaveform Comment  +---------+------------------+-----+---------+--------+ Brachial 155                    triphasic         +---------+------------------+-----+---------+--------+ PTA      121               0.74 biphasic          +---------+------------------+-----+---------+--------+ DP       151               0.93 triphasic         +---------+------------------+-----+---------+--------+ Theodoro Parma               0.69 Abnormal          +---------+------------------+-----+---------+--------+ +---------+------------------+-----+-------------------+-----------------------+ Left     Lt Pressure (mmHg)IndexWaveform           Comment                 +---------+------------------+-----+-------------------+-----------------------+ Brachial 163                    triphasic                                  +---------+------------------+-----+-------------------+-----------------------+ PTA      66                0.40 dampened monophasic                         +---------+------------------+-----+-------------------+-----------------------+ DP       64                0.39 dampened monophasic                        +---------+------------------+-----+-------------------+-----------------------+ Great Toe                                          unable to obtain                                                           pressure due to                                                            decreased great toe                                                        amplitude               +---------+------------------+-----+-------------------+-----------------------+ +-------+-----------+-----------+------------+------------+ ABI/TBIToday's ABIToday's TBIPrevious ABIPrevious TBI +-------+-----------+-----------+------------+------------+ Right  0.93       0.69                                +-------+-----------+-----------+------------+------------+  Left   0.40                                           +-------+-----------+-----------+------------+------------+   Summary: Right: Resting right ankle-brachial index indicates mild right lower extremity arterial disease. The right toe-brachial index is abnormal. Left: Resting left ankle-brachial index indicates severe left lower extremity arterial disease. *See table(s) above for measurements and observations.  Electronically signed by Jamelle Haring on 04/09/2022 at 7:26:35 AM.    Final    ECHOCARDIOGRAM COMPLETE  Result Date: 04/08/2022    ECHOCARDIOGRAM REPORT   Patient Name:   SKAI LICKTEIG Date of Exam: 04/08/2022 Medical Rec #:  [id number]      Height:       62.5 in Accession #:    [id number]     Weight:       207.9 lb Date of Birth:  [birth year]      BSA:          1.955 m Patient Age:    29 years       BP:           198/108 mmHg Patient Gender: F              HR:           96 bpm. Exam Location:  Inpatient Procedure: 2D Echo, Cardiac Doppler and Color Doppler Indications:     R07.9* Chest pain, unspecified  History:        Patient has no prior history of Echocardiogram examinations.                 Risk Factors:Hypertension and Diabetes.  Sonographer:    Bernadene Person RDCS Referring Phys: [place]  1. Inferior Septal / apical hypokinesis . Left ventricular ejection fraction, by estimation, is 35 to 40%. The left ventricle has moderately decreased function. The left ventricle demonstrates regional wall motion abnormalities (see scoring diagram/findings for description). The left ventricular internal cavity size was mildly dilated. Left ventricular diastolic parameters were normal.  2. Right ventricular systolic function is normal. The right ventricular size is normal.  3. A small pericardial effusion is present. The pericardial effusion is posterior to the left ventricle and anterior to the right ventricle.  4. The mitral valve is abnormal. Trivial mitral valve regurgitation. No evidence of mitral stenosis.  5. The aortic valve was not well visualized. There is mild calcification of the aortic valve. There is mild thickening of the aortic valve. Aortic valve regurgitation is trivial. Aortic valve sclerosis is present, with no evidence of aortic valve stenosis.  6. The inferior vena cava is normal in size with greater than 50% respiratory variability, suggesting right atrial pressure of 3 mmHg. FINDINGS  Left Ventricle: Inferior Septal / apical hypokinesis. Left ventricular ejection fraction, by estimation, is 35 to 40%. The left ventricle has moderately decreased function. The left ventricle demonstrates regional wall motion abnormalities. The left ventricular internal cavity size was mildly dilated. There is no left ventricular hypertrophy. Left ventricular diastolic parameters were normal. Right Ventricle: The right ventricular size is normal. No increase in right ventricular wall thickness. Right ventricular systolic function is normal. Left Atrium: Left  atrial size was normal in size. Right Atrium: Right atrial size was normal in size. Pericardium: A small pericardial effusion is present. The pericardial effusion is posterior to the left ventricle and  anterior to the right ventricle. Mitral Valve: The mitral valve is abnormal. There is mild thickening of the mitral valve leaflet(s). There is mild calcification of the mitral valve leaflet(s). Mild mitral annular calcification. Trivial mitral valve regurgitation. No evidence of mitral valve stenosis. Tricuspid Valve: The tricuspid valve is normal in structure. Tricuspid valve regurgitation is trivial. No evidence of tricuspid stenosis. Aortic Valve: The aortic valve was not well visualized. There is mild calcification of the aortic valve. There is mild thickening of the aortic valve. Aortic valve regurgitation is trivial. Aortic regurgitation PHT measures 481 msec. Aortic valve sclerosis is present, with no evidence of aortic valve stenosis. Pulmonic Valve: The pulmonic valve was normal in structure. Pulmonic valve regurgitation is not visualized. No evidence of pulmonic stenosis. Aorta: The aortic root is normal in size and structure. Venous: The inferior vena cava is normal in size with greater than 50% respiratory variability, suggesting right atrial pressure of 3 mmHg. IAS/Shunts: No atrial level shunt detected by color flow Doppler.  LEFT VENTRICLE PLAX 2D LVIDd:         4.70 cm      Diastology LVIDs:         3.90 cm      LV e' medial:    5.93 cm/s LV PW:         1.10 cm      LV E/e' medial:  12.5 LV IVS:        1.00 cm      LV e' lateral:   6.68 cm/s LVOT diam:     2.00 cm      LV E/e' lateral: 11.1 LV SV:         60 LV SV Index:   31 LVOT Area:     3.14 cm  LV Volumes (MOD) LV vol d, MOD A2C: 126.0 ml LV vol d, MOD A4C: 93.4 ml LV vol s, MOD A2C: 75.2 ml LV vol s, MOD A4C: 48.9 ml LV SV MOD A2C:     50.8 ml LV SV MOD A4C:     93.4 ml LV SV MOD BP:      46.4 ml RIGHT VENTRICLE RV S prime:     11.90 cm/s TAPSE  (M-mode): 1.7 cm LEFT ATRIUM             Index        RIGHT ATRIUM          Index LA diam:        2.70 cm 1.38 cm/m   RA Area:     9.28 cm LA Vol (A2C):   38.6 ml 19.74 ml/m  RA Volume:   17.80 ml 9.10 ml/m LA Vol (A4C):   31.5 ml 16.11 ml/m LA Biplane Vol: 36.0 ml 18.41 ml/m  AORTIC VALVE LVOT Vmax:         120.00 cm/s LVOT Vmean:        79.900 cm/s LVOT VTI:          0.190 m AI PHT:            481 msec AR Vena Contracta: 0.20 cm  AORTA Ao Root diam: 3.20 cm Ao Asc diam:  3.60 cm MITRAL VALVE MV Area (PHT): 5.13 cm    SHUNTS MV Decel Time: 148 msec    Systemic VTI:  0.19 m MV E velocity: 74.10 cm/s  Systemic Diam: 2.00 cm MV A velocity: 61.30 cm/s MV E/A ratio:  1.21 Jenkins Rouge MD Electronically signed by Jenkins Rouge  MD Signature Date/Time: 04/08/2022/1:51:06 PM    Final    ECHOCARDIOGRAM LIMITED BUBBLE STUDY  Result Date: 04/22/2022    ECHOCARDIOGRAM REPORT   Patient Name:   ARTHUR AYDELOTTE Date of Exam: 04/22/2022 Medical Rec #:  [id number]      Height:       62.0 in Accession #:    [id number]     Weight:       192.2 lb Date of Birth:  08/13/1966      BSA:          1.880 m Patient Age:    14 years       BP:           127/77 mmHg Patient Gender: F              HR:           94 bpm. Exam Location:  Inpatient Procedure: 2D Echo, Limited Echo, Cardiac Doppler, Color Doppler and Saline            Contrast Bubble Study Indications:    Stroke  History:        Patient has prior history of Echocardiogram examinations, most                 recent 04/08/2022. CHF, CAD; Risk Factors:Hypertension, Diabetes                 and Current Smoker.  Sonographer:    Jyl Heinz Referring Phys: [id number] Hortencia Conradi MEIER IMPRESSIONS  1. Left ventricular ejection fraction, by estimation, is 40 to 45%. The left ventricle has mildly decreased function. The left ventricle demonstrates global hypokinesis. There is mild left ventricular hypertrophy. Left ventricular diastolic parameters are consistent with Grade II diastolic  dysfunction (pseudonormalization).  2. Right ventricular systolic function is normal. The right ventricular size is normal.  3. The pericardial effusion is circumferential.  4. The mitral valve is normal in structure. No evidence of mitral valve regurgitation. No evidence of mitral stenosis.  5. The aortic valve is normal in structure. Aortic valve regurgitation is trivial. No aortic stenosis is present.  6. The inferior vena cava is normal in size with greater than 50% respiratory variability, suggesting right atrial pressure of 3 mmHg.  7. Agitated saline contrast bubble study was negative, with no evidence of any interatrial shunt. Comparison(s): Prior images reviewed side by side. The left ventricular function has improved. Conclusion(s)/Recommendation(s): No intracardiac source of embolism detected on this transthoracic study. Consider a transesophageal echocardiogram to exclude cardiac source of embolism if clinically indicated. FINDINGS  Left Ventricle: Left ventricular ejection fraction, by estimation, is 40 to 45%. The left ventricle has mildly decreased function. The left ventricle demonstrates global hypokinesis. The left ventricular internal cavity size was normal in size. There is  mild left ventricular hypertrophy. Left ventricular diastolic parameters are consistent with Grade II diastolic dysfunction (pseudonormalization). Right Ventricle: The right ventricular size is normal. No increase in right ventricular wall thickness. Right ventricular systolic function is normal. Left Atrium: Left atrial size was normal in size. Right Atrium: Right atrial size was normal in size. Pericardium: Trivial pericardial effusion is present. The pericardial effusion is circumferential. Mitral Valve: The mitral valve is normal in structure. There is mild thickening of the mitral valve leaflet(s). There is mild calcification of the mitral valve leaflet(s). No evidence of mitral valve regurgitation. No evidence of mitral  valve stenosis. Tricuspid Valve: The tricuspid valve is normal in structure. Tricuspid valve regurgitation is not  demonstrated. No evidence of tricuspid stenosis. Aortic Valve: The aortic valve is normal in structure. Aortic valve regurgitation is trivial. Aortic regurgitation PHT measures 470 msec. No aortic stenosis is present. Aortic valve peak gradient measures 14.4 mmHg. Pulmonic Valve: The pulmonic valve was normal in structure. Pulmonic valve regurgitation is not visualized. No evidence of pulmonic stenosis. Aorta: The aortic root is normal in size and structure. Venous: The inferior vena cava is normal in size with greater than 50% respiratory variability, suggesting right atrial pressure of 3 mmHg. IAS/Shunts: No atrial level shunt detected by color flow Doppler. Agitated saline contrast was given intravenously to evaluate for intracardiac shunting. Agitated saline contrast bubble study was negative, with no evidence of any interatrial shunt. There  is no evidence of a patent foramen ovale.  LEFT VENTRICLE PLAX 2D LVIDd:         4.70 cm      Diastology LVIDs:         3.50 cm      LV e' medial:    6.31 cm/s LV PW:         1.40 cm      LV E/e' medial:  14.7 LV IVS:        1.40 cm      LV e' lateral:   7.18 cm/s LVOT diam:     1.80 cm      LV E/e' lateral: 13.0 LV SV:         47 LV SV Index:   25 LVOT Area:     2.54 cm  LV Volumes (MOD) LV vol d, MOD A2C: 110.0 ml LV vol d, MOD A4C: 128.0 ml LV vol s, MOD A2C: 69.1 ml LV vol s, MOD A4C: 80.3 ml LV SV MOD A2C:     40.9 ml LV SV MOD A4C:     128.0 ml LV SV MOD BP:      43.6 ml RIGHT VENTRICLE             IVC RV S prime:     11.40 cm/s  IVC diam: 1.50 cm TAPSE (M-mode): 2.0 cm LEFT ATRIUM         Index LA diam:    3.20 cm 1.70 cm/m  AORTIC VALVE AV Area (Vmax): 1.51 cm AV Vmax:        190.00 cm/s AV Peak Grad:   14.4 mmHg LVOT Vmax:      113.00 cm/s LVOT Vmean:     81.200 cm/s LVOT VTI:       0.184 m AI PHT:         470 msec  AORTA Ao Root diam: 2.70 cm Ao Asc  diam:  3.00 cm MITRAL VALVE MV Area (PHT): 4.83 cm    SHUNTS MV Decel Time: 157 msec    Systemic VTI:  0.18 m MV E velocity: 93.00 cm/s  Systemic Diam: 1.80 cm MV A velocity: 73.30 cm/s MV E/A ratio:  1.27 Candee Furbish MD Electronically signed by Candee Furbish MD Signature Date/Time: 04/22/2022/1:20:57 PM    Final    CT Angio Chest/Abd/Pel for Dissection W and/or Wo Contrast  Result Date: 04/20/2022 CLINICAL DATA:  Epigastric discomfort with nausea, vomiting, and shortness of breath. Hypertensive. EXAM: CT ANGIOGRAPHY CHEST, ABDOMEN AND PELVIS TECHNIQUE: Non-contrast CT of the chest was initially obtained. Multidetector CT imaging through the chest, abdomen and pelvis was performed using the standard protocol during bolus administration of intravenous contrast. Multiplanar reconstructed images and MIPs were obtained and reviewed to  evaluate the vascular anatomy. RADIATION DOSE REDUCTION: This exam was performed according to the departmental dose-optimization program which includes automated exposure control, adjustment of the mA and/or kV according to patient size and/or use of iterative reconstruction technique. CONTRAST:  12m OMNIPAQUE IOHEXOL 350 MG/ML SOLN COMPARISON:  CT chest, abdomen, and pelvis dated April 08, 2022. FINDINGS: CTA CHEST FINDINGS Cardiovascular: Preferential opacification of the thoracic aorta. No evidence of thoracic aortic aneurysm or dissection. Normal heart size. Unchanged small pericardial effusion. No central pulmonary embolism. Mediastinum/Nodes: No enlarged mediastinal, hilar, or axillary lymph nodes. Thyroid gland, trachea, and esophagus demonstrate no significant findings. Lungs/Pleura: Minimal subsegmental atelectasis at both lung bases. No focal consolidation, pleural effusion, or pneumothorax. Musculoskeletal: Chronic left breast seroma is unchanged, presumably postsurgical given history of prior lumpectomy. No acute or significant osseous findings. Review of the MIP images  confirms the above findings. CTA ABDOMEN AND PELVIS FINDINGS VASCULAR Aorta: Normal caliber aorta without aneurysm, dissection, vasculitis or significant stenosis. Unchanged calcified and noncalcified atherosclerotic plaque. Celiac: Patent with similar mild stenosis of the proximal artery. No evidence of aneurysm, dissection, or vasculitis. SMA: Patent without evidence of aneurysm, dissection, vasculitis or significant stenosis. Renals: Unchanged severe stenosis of the right renal artery origin. The left renal artery is unremarkable. IMA: Occluded. 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 Veins: No obvious venous abnormality within the limitations of this arterial phase study. Review of the MIP images confirms the above findings. NON-VASCULAR Hepatobiliary: No focal liver abnormality is seen. No gallstones, gallbladder wall thickening, or biliary dilatation. Pancreas: Unremarkable. No pancreatic ductal dilatation or surrounding inflammatory changes. Spleen: Normal in size without focal abnormality. Adrenals/Urinary Tract: Adrenal glands are unremarkable. Similar patchy cortical hypodensity in the upper pole of the right kidney with increasing small amount of perinephric fluid. The left kidney is unremarkable. No hydronephrosis. Bladder is unremarkable. Stomach/Bowel: Unchanged small hiatal hernia. The stomach is otherwise within normal limits. No bowel wall thickening, distention, or surrounding inflammatory changes. Normal appendix. Lymphatic: No enlarged abdominal or pelvic lymph nodes. Reproductive: Status post hysterectomy. No adnexal masses. Other: No abdominal wall hernia or abnormality. No abdominopelvic ascites. No pneumoperitoneum. Musculoskeletal: No acute or significant osseous findings. Chronic bilateral sacroiliitis. Review of the MIP images confirms the above findings. IMPRESSION: 1. No evidence of acute aortic syndrome or thoracoabdominal aortic aneurysm.  2. Similar patchy cortical hypodensity in the upper pole of the right kidney with increasing small amount of perinephric fluid, concerning for pyelonephritis versus infarct. 3. Unchanged severe stenosis of the right renal artery origin. 4. Chronically occluded IMA, left internal iliac artery, and proximal left SFA. 5. Unchanged small pericardial effusion. Electronically Signed   By: WTitus DubinM.D.   On: 04/20/2022 17:39   CT Angio Chest/Abd/Pel for Dissection W and/or Wo Contrast  Result Date: 04/08/2022 CLINICAL DATA:  Chest pain and back pain. EXAM: CT ANGIOGRAPHY CHEST, ABDOMEN AND PELVIS TECHNIQUE: Non-contrast CT of the chest was initially obtained. Multidetector CT imaging through the chest, abdomen and pelvis was performed using the standard protocol during bolus administration of intravenous contrast. Multiplanar reconstructed images and MIPs were obtained and reviewed to evaluate the vascular anatomy. RADIATION DOSE REDUCTION: This exam was performed according to the departmental dose-optimization program which includes automated exposure control, adjustment of the mA and/or kV according to patient size and/or use of iterative reconstruction technique. CONTRAST:  1028mOMNIPAQUE IOHEXOL 350 MG/ML SOLN COMPARISON:  None. FINDINGS: CTA CHEST FINDINGS Cardiovascular: Preferential opacification of the thoracic aorta. No  evidence of thoracic aortic aneurysm or dissection. Normal heart size. There is a small pericardial effusion. Mediastinum/Nodes: No enlarged mediastinal, hilar, or axillary lymph nodes. Thyroid gland, trachea, and esophagus demonstrate no significant findings. Lungs/Pleura: There is minimal atelectasis in the inferior left upper lobe. The lungs are otherwise clear. No pleural effusion or pneumothorax identified. Musculoskeletal: No acute fractures are identified. There is an ill-defined low attenuation collection in the left breast measuring approximately 4.0 x 7.4 x 3.0 cm. There is  no surrounding enhancement. Review of the MIP images confirms the above findings. CTA ABDOMEN AND PELVIS FINDINGS VASCULAR Aorta: Normal caliber aorta without aneurysm, dissection, vasculitis or significant stenosis. There is severe atherosclerotic disease throughout the aorta. There ulcerated plaques in the proximal abdominal aorta. There is also calcified atherosclerotic disease. Celiac: Patent without evidence of aneurysm, dissection, vasculitis or significant stenosis. SMA: Patent without evidence of aneurysm, dissection, vasculitis or significant stenosis. Renals: Severe focal stenosis origin right renal artery. Left renal artery within normal limits. IMA: Not seen. Inflow: Patent without evidence of aneurysm, dissection, vasculitis or significant stenosis. There is occlusion of the visualized proximal left femoral artery. Veins: No obvious venous abnormality within the limitations of this arterial phase study. Review of the MIP images confirms the above findings. NON-VASCULAR Hepatobiliary: No focal liver abnormality is seen. No gallstones, gallbladder wall thickening, or biliary dilatation. Pancreas: Unremarkable. No pancreatic ductal dilatation or surrounding inflammatory changes. Spleen: Normal in size without focal abnormality. Adrenals/Urinary Tract: There is some patchy areas of cortical hypodensity in the superior pole the right kidney. There is no hydronephrosis or perinephric fluid. Adrenal glands and bladder are within normal limits. Stomach/Bowel: Stomach is within normal limits. Appendix appears normal. No evidence of bowel wall thickening, distention, or inflammatory changes. Lymphatic: No enlarged lymph nodes are seen. Reproductive: Status post hysterectomy. No adnexal masses. Other: No abdominal wall hernia or abnormality. No abdominopelvic ascites. Musculoskeletal: No acute or significant osseous findings. Review of the MIP images confirms the above findings. IMPRESSION: 1. No evidence for  aortic dissection or aneurysm. 2. Severe atherosclerotic disease of the abdominal aorta with noncalcified plaque and ulcerated plaque. 3. 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 4. Severe focal stenosis origin of the right renal artery. 5. IMA not visualized. 6. Patchy hypodensity superior pole the right kidney, indeterminate. Findings may represent pyelonephritis or infarct. Other focal lesion can not be excluded. Consider follow-up evaluation with dedicated renal CT or MRI. 7. Small pericardial effusion. 8. Low-density collection in the left breast, indeterminate. Findings may be related to fluid collection or resolving hematoma. Recommend clinical correlation. Consider ultrasound. Electronically Signed   By: Ronney Asters M.D.   On: 04/08/2022 00:53   VAS US CAROTID  Result Date: 04/23/2022 Carotid Arterial Duplex Study Patient Name:  LASHANDA STORLIE  Date of Exam:   04/23/2022 Medical Rec #: [id number]       Accession #:    [id number] Date of Birth: January 18, 1966       Patient Gender: F Patient Age:   24 years Exam Location:  [facility] Procedure:      VAS US CAROTID Referring Phys: Cornelius Moras XU --------------------------------------------------------------------------------  Indications:       CVA. Risk Factors:      Hypertension, Diabetes. Comparison Study:  no prior Performing Technologist: Archie Patten RVS  Examination Guidelines: A complete evaluation includes B-mode imaging, spectral Doppler, color Doppler, and power Doppler as needed of all accessible portions of each vessel. Bilateral testing is considered an integral  part of a complete examination. Limited examinations for reoccurring indications may be performed as noted.  Right Carotid Findings: +----------+--------+--------+--------+-------------------------+--------+           PSV cm/sEDV cm/sStenosisPlaque Description       Comments +----------+--------+--------+--------+-------------------------+--------+ CCA Prox   84      17              heterogenous                      +----------+--------+--------+--------+-------------------------+--------+ CCA Distal93      19              heterogenous                      +----------+--------+--------+--------+-------------------------+--------+ ICA Prox  112     35      1-39%   heterogenous and calcific         +----------+--------+--------+--------+-------------------------+--------+ ICA Distal134     38                                                +----------+--------+--------+--------+-------------------------+--------+ ECA       116     15                                                +----------+--------+--------+--------+-------------------------+--------+ +----------+--------+-------+--------+-------------------+           PSV cm/sEDV cmsDescribeArm Pressure (mmHG) +----------+--------+-------+--------+-------------------+ [id number]                                        +----------+--------+-------+--------+-------------------+ +---------+--------+--+--------+--+---------+ VertebralPSV cm/s82EDV cm/s23Antegrade +---------+--------+--+--------+--+---------+  Left Carotid Findings: +----------+--------+--------+--------+-------------------------+--------+           PSV cm/sEDV cm/sStenosisPlaque Description       Comments +----------+--------+--------+--------+-------------------------+--------+ CCA Prox  117     27              heterogenous                      +----------+--------+--------+--------+-------------------------+--------+ CCA Distal91      29              heterogenous                      +----------+--------+--------+--------+-------------------------+--------+ ICA Prox  119     33      1-39%   heterogenous and calcific         +----------+--------+--------+--------+-------------------------+--------+ ICA Distal78      22                                                 +----------+--------+--------+--------+-------------------------+--------+ ECA       207     31                                                +----------+--------+--------+--------+-------------------------+--------+ +----------+--------+--------+--------+-------------------+  PSV cm/sEDV cm/sDescribeArm Pressure (mmHG) +----------+--------+--------+--------+-------------------+ Subclavian120                                         +----------+--------+--------+--------+-------------------+ +---------+--------+--+--------+-+---------+ VertebralPSV cm/s28EDV cm/s9Antegrade +---------+--------+--+--------+-+---------+   Summary: Right Carotid: Velocities in the right ICA are consistent with a 1-39% stenosis. Left Carotid: Velocities in the left ICA are consistent with a 1-39% stenosis. Vertebrals: Bilateral vertebral arteries demonstrate antegrade flow. *See table(s) above for measurements and observations.  Electronically signed by Antony Contras MD on 04/23/2022 at 4:30:27 PM.    Final    VAS [place] LOWER EXTREMITY VENOUS (DVT)  Result Date: 04/09/2022  Lower Venous DVT Study Patient Name:  CYNTHYA YAM  Date of Exam:   04/09/2022 Medical Rec #: [id number]       Accession #:    [id number] Date of Birth: 1966/01/12       Patient Gender: F Patient Age:   17 years Exam Location:  [facility] Procedure:      VAS [place] LOWER EXTREMITY VENOUS (DVT) Referring Phys: DAWOOD ELGERGAWY --------------------------------------------------------------------------------  Indications: D-dimer 1.87.  Comparison Study: No prior venous studies. Left femoral artery occlusion on CTA Performing Technologist: Darlin Coco RDMS, RVT  Examination Guidelines: A complete evaluation includes B-mode imaging, spectral Doppler, color Doppler, and power Doppler as needed of all accessible portions of each vessel. Bilateral testing is considered an integral part of a complete examination. Limited examinations for  reoccurring indications may be performed as noted. The reflux portion of the exam is performed with the patient in reverse Trendelenburg.  +---------+---------------+---------+-----------+----------+--------------+ RIGHT    CompressibilityPhasicitySpontaneityPropertiesThrombus Aging +---------+---------------+---------+-----------+----------+--------------+ CFV      Full           Yes      Yes                                 +---------+---------------+---------+-----------+----------+--------------+ SFJ      Full                                                        +---------+---------------+---------+-----------+----------+--------------+ FV Prox  Full                                                        +---------+---------------+---------+-----------+----------+--------------+ FV Mid   Full                                                        +---------+---------------+---------+-----------+----------+--------------+ FV DistalFull                                                        +---------+---------------+---------+-----------+----------+--------------+ PFV      Full                                                        +---------+---------------+---------+-----------+----------+--------------+  POP      Full           Yes      Yes                                 +---------+---------------+---------+-----------+----------+--------------+ PTV      Full                                                        +---------+---------------+---------+-----------+----------+--------------+ PERO     Full                                                        +---------+---------------+---------+-----------+----------+--------------+ Gastroc  Full                                                        +---------+---------------+---------+-----------+----------+--------------+    +---------+---------------+---------+-----------+----------+--------------+ LEFT     CompressibilityPhasicitySpontaneityPropertiesThrombus Aging +---------+---------------+---------+-----------+----------+--------------+ CFV      Full           Yes      Yes                                 +---------+---------------+---------+-----------+----------+--------------+ SFJ      Full                                                        +---------+---------------+---------+-----------+----------+--------------+ FV Prox  Full                                                        +---------+---------------+---------+-----------+----------+--------------+ FV Mid   Full                                                        +---------+---------------+---------+-----------+----------+--------------+ FV DistalFull                                                        +---------+---------------+---------+-----------+----------+--------------+ PFV      Full                                                        +---------+---------------+---------+-----------+----------+--------------+  POP      Full           Yes      Yes                                 +---------+---------------+---------+-----------+----------+--------------+ PTV      Full                                                        +---------+---------------+---------+-----------+----------+--------------+ PERO     Full                                                        +---------+---------------+---------+-----------+----------+--------------+ Gastroc  Full                                                        +---------+---------------+---------+-----------+----------+--------------+     Summary: RIGHT: - There is no evidence of deep vein thrombosis in the lower extremity.  - No cystic structure found in the popliteal fossa.  LEFT: - There is no evidence of deep vein thrombosis in  the lower extremity.  - No cystic structure found in the popliteal fossa.  *See table(s) above for measurements and observations. Electronically signed by Monica Martinez MD on 04/09/2022 at 8:47:30 PM.    Final      TODAY-DAY OF DISCHARGE:  Subjective:   Elder Negus today has no headache,no chest abdominal pain,no new weakness tingling or numbness, feels much better wants to go home today.   Objective:   Blood pressure (!) 126/99, pulse 87, temperature 98.5 F (36.9 C), temperature source Oral, resp. rate 20, weight 94.6 kg, SpO2 95 %.  Intake/Output Summary (Last 24 hours) at 04/29/2022 1000 Last data filed at 04/29/2022 0400 Gross per 24 hour  Intake 660 ml  Output 600 ml  Net 60 ml   Filed Weights   04/27/22 0500 04/28/22 0500 04/29/22 0230  Weight: 98.9 kg 98.9 kg 94.6 kg    Exam: Awake Alert, Oriented *3, No new F.N deficits, Normal affect [place].AT,PERRAL Supple Neck,No JVD, No cervical lymphadenopathy appriciated.  Symmetrical Chest wall movement, Good air movement bilaterally, CTAB RRR,No Gallops,Rubs or new Murmurs, No Parasternal Heave +ve B.Sounds, Abd Soft, Non tender, No organomegaly appriciated, No rebound -guarding or rigidity. No Cyanosis, Clubbing or edema, No new Rash or bruise   PERTINENT RADIOLOGIC STUDIES: No results found.   PERTINENT LAB RESULTS: CBC: Recent Labs    04/27/22 0029  WBC 11.3*  HGB 10.8*  HCT 34.2*  PLT 287   CMET CMP     Component Value Date/Time   NA 134 (L) 04/27/2022 0029   K 4.7 04/27/2022 0029   CL 103 04/27/2022 0029   CO2 21 (L) 04/27/2022 0029   GLUCOSE 122 (H) 04/27/2022 0029   BUN 22 (H) 04/27/2022 0029   CREATININE 1.65 (H) 04/27/2022 0029   CALCIUM 8.9 04/27/2022 0029   PROT 7.7 04/20/2022 1207  ALBUMIN 2.2 (L) 04/26/2022 2234   AST 19 04/20/2022 1207   ALT 13 04/20/2022 1207   ALKPHOS 65 04/20/2022 1207   BILITOT 0.4 04/20/2022 1207   GFRNONAA 36 (L) 04/27/2022 0029   GFRAA >60 06/18/2020 1139     GFR Estimated Creatinine Clearance: 39.8 mL/min (A) (by C-G formula based on SCr of 1.65 mg/dL (H)). No results for input(s): LIPASE, AMYLASE in the last 72 hours. No results for input(s): CKTOTAL, CKMB, CKMBINDEX, TROPONINI in the last 72 hours. Invalid input(s): POCBNP Recent Labs    04/26/22 2234  DDIMER 1.18*   No results for input(s): HGBA1C in the last 72 hours. No results for input(s): CHOL, HDL, LDLCALC, TRIG, CHOLHDL, LDLDIRECT in the last 72 hours. No results for input(s): TSH, T4TOTAL, T3FREE, THYROIDAB in the last 72 hours.  Invalid input(s): FREET3 No results for input(s): VITAMINB12, FOLATE, FERRITIN, TIBC, IRON, RETICCTPCT in the last 72 hours. Coags: No results for input(s): INR in the last 72 hours.  Invalid input(s): PT Microbiology: Recent Results (from the past 240 hour(s))  Blood culture (routine x 2)     Status: None   Collection Time: 04/20/22  8:50 PM   Specimen: BLOOD LEFT FOREARM  Result Value Ref Range Status   Specimen Description BLOOD LEFT FOREARM  Final   Special Requests   Final    BOTTLES DRAWN AEROBIC ONLY Blood Culture results may not be optimal due to an inadequate volume of blood received in culture bottles   Culture   Final    NO GROWTH 5 DAYS Performed at [facility], [place]. [address] [place], [place] [address]    Report Status 04/25/2022 FINAL  Final  Blood culture (routine x 2)     Status: None   Collection Time: 04/21/22 12:47 AM   Specimen: BLOOD  Result Value Ref Range Status   Specimen Description BLOOD SITE NOT SPECIFIED  Final   Special Requests   Final    BOTTLES DRAWN AEROBIC AND ANAEROBIC Blood Culture adequate volume   Culture   Final    NO GROWTH 5 DAYS Performed at [facility], [address] [place], [place] [address]    Report Status 04/26/2022 FINAL  Final  Resp Panel by RT-PCR (Flu A&B, Covid) Nasopharyngeal Swab     Status: None   Collection Time: 04/21/22 12:50 AM   Specimen: Nasopharyngeal  Swab; Nasopharyngeal(NP) swabs in vial transport medium  Result Value Ref Range Status   SARS Coronavirus 2 by RT PCR NEGATIVE NEGATIVE Final    Comment: (NOTE) SARS-CoV-2 target nucleic acids are NOT DETECTED.  The SARS-CoV-2 RNA is generally detectable in upper respiratory specimens during the acute phase of infection. The lowest concentration of SARS-CoV-2 viral copies this assay can detect is 138 copies/mL. A negative result does not preclude SARS-Cov-2 infection and should not be used as the sole basis for treatment or other patient management decisions. A negative result may occur with  improper specimen collection/handling, submission of specimen other than nasopharyngeal swab, presence of viral mutation(s) within the areas targeted by this assay, and inadequate number of viral copies(<138 copies/mL). A negative result must be combined with clinical observations, patient history, and epidemiological information. The expected result is Negative.  Fact Sheet for Patients:  EntrepreneurPulse.com.au  Fact Sheet for Healthcare Providers:  IncredibleEmployment.be  This test is no t yet approved or cleared by the [place] FDA and  has been authorized for detection and/or diagnosis of SARS-CoV-2 by FDA under  an Emergency Use Authorization (EUA). This EUA will remain  in effect (meaning this test can be used) for the duration of the COVID-19 declaration under Section 564(b)(1) of the Act, 21 U.S.C.section 360bbb-3(b)(1), unless the authorization is terminated  or revoked sooner.       Influenza A by PCR NEGATIVE NEGATIVE Final   Influenza B by PCR NEGATIVE NEGATIVE Final    Comment: (NOTE) The Xpert Xpress SARS-CoV-2/FLU/RSV plus assay is intended as an aid in the diagnosis of influenza from Nasopharyngeal swab specimens and should not be used as a sole basis for treatment. Nasal washings and aspirates are unacceptable for Xpert Xpress  SARS-CoV-2/FLU/RSV testing.  Fact Sheet for Patients: EntrepreneurPulse.com.au  Fact Sheet for Healthcare Providers: IncredibleEmployment.be  This test is not yet approved or cleared by the [place] FDA and has been authorized for detection and/or diagnosis of SARS-CoV-2 by FDA under an Emergency Use Authorization (EUA). This EUA will remain in effect (meaning this test can be used) for the duration of the COVID-19 declaration under Section 564(b)(1) of the Act, 21 U.S.C. section 360bbb-3(b)(1), unless the authorization is terminated or revoked.  Performed at [facility], [place] [address] [place], [place] [address]   MRSA Next Gen by PCR, Nasal     Status: None   Collection Time: 04/21/22  3:28 AM   Specimen: Nasal Mucosa; Nasal Swab  Result Value Ref Range Status   MRSA by PCR Next Gen NOT DETECTED NOT DETECTED Final    Comment: (NOTE) The GeneXpert MRSA Assay (FDA approved for NASAL specimens only), is one component of a comprehensive MRSA colonization surveillance program. It is not intended to diagnose MRSA infection nor to guide or monitor treatment for MRSA infections. Test performance is not FDA approved in patients less than 85 years old. Performed at [facility], [place] [address] [place], [place] [address]     FURTHER DISCHARGE INSTRUCTIONS:  Get Medicines reviewed and adjusted: Please take all your medications with you for your next visit with your Primary MD  Laboratory/radiological data: Please request your Primary MD to go over all hospital tests and procedure/radiological results at the follow up, please ask your Primary MD to get all Hospital records sent to his/her office.  In some cases, they will be blood work, cultures and biopsy results pending at the time of your discharge. Please request that your primary care M.D. goes through all the records of your hospital data and follows up on these  results.  Also Note the following: If you experience worsening of your admission symptoms, develop shortness of breath, life threatening emergency, suicidal or homicidal thoughts you must seek medical attention immediately by calling 911 or calling your MD immediately  if symptoms less severe.  You must read complete instructions/literature along with all the possible adverse reactions/side effects for all the Medicines you take and that have been prescribed to you. Take any new Medicines after you have completely understood and accpet all the possible adverse reactions/side effects.   Do not drive when taking Pain medications or sleeping medications (Benzodaizepines)  Do not take more than prescribed Pain, Sleep and Anxiety Medications. It is not advisable to combine anxiety,sleep and pain medications without talking with your primary care practitioner  Special Instructions: If you have smoked or chewed Tobacco  in the last 2 yrs please stop smoking, stop any regular Alcohol  and or any Recreational drug use.  Wear Seat belts while driving.  Please note: You were cared  for by a hospitalist during your hospital stay. Once you are discharged, your primary care physician will handle any further medical issues. Please note that NO REFILLS for any discharge medications will be authorized once you are discharged, as it is imperative that you return to your primary care physician (or establish a relationship with a primary care physician if you do not have one) for your post hospital discharge needs so that they can reassess your need for medications and monitor your lab values.  Total Time spent coordinating discharge including counseling, education and face to face time equals greater than 30 minutes.  Signed: Anessia Oakland 04/29/2022 10:00 AM

## 2022-04-29 NOTE — Progress Notes (Deleted)
PMR Admission Coordinator Pre-Admission Assessment  Patient: Erin Jackson is an 56 y.o., female MRN: [id number] DOB: Jun 05, 1966 Height:   Weight:    Insurance Information HMO: yes    PPO:      PCP:      IPA:      80/20:      OTHER:  PRIMARY: [employer] Medicare      Policy#: [id number]      Subscriber: pt CM Name: Wilburn Cornelia      Phone#: [phone]     Fax#: [phone] Pre-Cert#: [id number] [place] for CIR from [place] with Bernadene Bell, updates due to fax listed above on 5/16     Employer:  Benefits:  Phone #: [phone]     Name:  Eff. Date: 12/21/21     Deduct: $0      Out of Pocket Max: $8300 (met $2400.14)      Life Max: n/a CIR: $1556/admit      SNF: 20 full days Outpatient: 80%     Co-Ins: 20% Home Health: 100%      Co-Pay:  DME: 80%     Co-Ins: 20% Providers:  SECONDARY: medicaid  access      Policy#: [id number]     Phone#: [phone]  Financial Counselor:       Phone#:   The "Data Collection Information Summary" for patients in Inpatient Rehabilitation Facilities with attached "Privacy Act [place] Records" was provided and verbally reviewed with: Patient and Family  Emergency Contact Information Contact Information     Name Relation Home Work Mobile   [place] Son [phone]  [phone]   Almira Bar   [phone]   [place],Jotrice Sister   [phone]   Gabriela Eves   [phone]       Current Medical History  Patient Admitting Diagnosis: R pontine, L hippocampus CVA  History of Present Illness: Pt is a 56 y/o female with PMH of HTN, DM, anxiety, chronic systolic CHF (EF 20-23%), PVD, atherosclerosis, tobacco abuse, chronic pain syndrome presented to [facility] 5/1 with L facial numbness, dizziness, N/V, SOB, dysuria, HA, and abdominal pain.  In ED: BP progressed to 239/116, tachypnic and tacchycardic, UDS positive for thc, CTA chest/abd/pelvis negative, CT head negative.  Started on cardene and admitted by [facility].  MRI completed and  showed acute/subacute L frontal, hippocampus, and R pons infarcts.  Suspect due to small vessel disease vs cardioembolic source (neuro recommended 30 day even monitor).  Hgb A1C 6.8.  Pt reporting musculoskeletal chest pain, reproducible by palpation which has resolved with lyrica.  During recent hospitalization she was found to have severe arterial disease in L and mild in R with claudication symptoms.  Vascular consulted but no role for emergent revascularization.  Therapy ongoing and recommendations are for CIR.      Patient's medical record from [facility] has been reviewed by the rehabilitation admission coordinator and physician.  Past Medical History  Past Medical History:  Diagnosis Date   Anxiety    Chronic back pain    Diabetes mellitus without complication (HCC)    GERD (gastroesophageal reflux disease)    Hypertension     Has the patient had major surgery during 100 days prior to admission? No  Family History   family history includes Breast cancer in her maternal aunt and maternal grandmother.  Current Medications No current facility-administered medications for this encounter.  Current Outpatient Medications:    [START ON 04/30/2022] dapagliflozin propanediol (FARXIGA) 5 MG TABS tablet, Take 1 tablet (5 mg total) by  mouth daily., Disp: 30 tablet, Rfl:    [START ON 04/30/2022] ezetimibe (ZETIA) 10 MG tablet, Take 1 tablet (10 mg total) by mouth daily., Disp: , Rfl:   Facility-Administered Medications Ordered in Other Encounters:    0.9 %  sodium chloride infusion, , Intravenous, PRN, Collier Bullock, MD, Stopped at 04/22/22 1406   acetaminophen (TYLENOL) tablet 650 mg, 650 mg, Oral, Q4H PRN, Frederik Pear, MD, 650 mg at 04/29/22 0345   ALPRAZolam (XANAX) tablet 0.25 mg, 0.25 mg, Oral, BID PRN, Sanjuan Dame, MD, 0.25 mg at 04/29/22 0911   alum & mag hydroxide-simeth (MAALOX/MYLANTA) 200-200-20 MG/5ML suspension 30 mL, 30 mL, Oral, Q6H PRN, Shalhoub, Sherryll Burger, MD,  30 mL at 04/25/22 0854   atorvastatin (LIPITOR) tablet 80 mg, 80 mg, Oral, Daily, Rosalin Hawking, MD, 80 mg at 04/29/22 5038   clopidogrel (PLAVIX) tablet 75 mg, 75 mg, Oral, Daily, Greta Doom, MD, 75 mg at 04/29/22 8828   dapagliflozin propanediol (FARXIGA) tablet 5 mg, 5 mg, Oral, Daily, Elgergawy, Silver Huguenin, MD, 5 mg at 04/29/22 0905   diphenhydrAMINE (BENADRYL) 12.5 MG/5ML elixir 12.5 mg, 12.5 mg, Oral, Q6H PRN, Elgergawy, Silver Huguenin, MD, 12.5 mg at 04/29/22 1210   docusate sodium (COLACE) capsule 100 mg, 100 mg, Oral, BID PRN, Shearon Stalls, Rahul P, PA-C, 100 mg at 04/27/22 2109   ezetimibe (ZETIA) tablet 10 mg, 10 mg, Oral, Daily, Rosalin Hawking, MD, 10 mg at 04/29/22 0909   heparin injection 5,000 Units, 5,000 Units, Subcutaneous, Q8H, Desai, Rahul P, PA-C, 5,000 Units at 04/29/22 1210   hydrALAZINE (APRESOLINE) injection 10 mg, 10 mg, Intravenous, Q6H PRN, Shalhoub, Sherryll Burger, MD   insulin aspart (novoLOG) injection 0-15 Units, 0-15 Units, Subcutaneous, Q4H, Desai, Rahul P, PA-C, 5 Units at 04/29/22 1210   LORazepam (ATIVAN) injection 1-2 mg, 1-2 mg, Intravenous, Q1H PRN, Kerney Elbe, MD, 1 mg at 04/25/22 1829   metoprolol tartrate (LOPRESSOR) injection 2.5-5 mg, 2.5-5 mg, Intravenous, Q3H PRN, Desai, Rahul P, PA-C, 5 mg at 04/25/22 1829   metoprolol tartrate (LOPRESSOR) tablet 50 mg, 50 mg, Oral, BID, Elgergawy, Silver Huguenin, MD, 50 mg at 04/29/22 0909   ondansetron (ZOFRAN) injection 4 mg, 4 mg, Intravenous, Q6H PRN, Desai, Rahul P, PA-C, 4 mg at 04/25/22 0854   pantoprazole (PROTONIX) EC tablet 40 mg, 40 mg, Oral, Daily, Shalhoub, Sherryll Burger, MD, 40 mg at 04/29/22 0909   polyethylene glycol (MIRALAX / GLYCOLAX) packet 17 g, 17 g, Oral, Daily PRN, Desai, Rahul P, PA-C   pregabalin (LYRICA) capsule 50 mg, 50 mg, Oral, Daily, Elgergawy, Silver Huguenin, MD, 50 mg at 04/29/22 0034   promethazine (PHENERGAN) 12.5 mg in sodium chloride 0.9 % 50 mL IVPB, 12.5 mg, Intravenous, Q6H PRN, Collier Bullock, MD,  Stopped at 04/22/22 1355   traZODone (DESYREL) tablet 150 mg, 150 mg, Oral, QHS, Sanjuan Dame, MD, 150 mg at 04/28/22 2109  Patients Current Diet:  Diet Order     None       Precautions / Restrictions     Has the patient had 2 or more falls or a fall with injury in the past year? Yes  Prior Activity Level    Prior Functional Level Self Care: Did the patient need help bathing, dressing, using the toilet or eating? Needed some help  Indoor Mobility: Did the patient need assistance with walking from room to room (with or without device)? Independent  Stairs: Did the patient need assistance with internal or external stairs (with or without device)? Needed  some help  Functional Cognition: Did the patient need help planning regular tasks such as shopping or remembering to take medications? Needed some help  Patient Information    Patient's Response To:     Home Assistive Devices / Equipment    Prior Device Use: Indicate devices/aids used by the patient prior to current illness, exacerbation or injury? None of the above  Current Functional Level Cognition       Extremity Assessment (includes Sensation/Coordination)          ADLs       Mobility       Transfers       Ambulation / Gait / Stairs / Wheelchair Mobility       Posture / Balance      Special needs/care consideration Diabetic management yes   Previous Home Environment (from acute therapy documentation)    Discharge Living Setting    Social/Family/Support Systems    Goals    Decrease burden of Care through IP rehab admission: n/a  Possible need for SNF placement upon discharge: Not anticipated.  Pt/family aware of need for 24/7 supervision at discharge and planning to pull together family and hired caregivers.  They would like a list of agencies.  They are aware that SNF is not typically approved by insurance following CIR stay.   Patient Condition: I have reviewed medical records  from [place], spoken with CSW, and patient and son. I met with patient at the bedside for inpatient rehabilitation assessment.  Patient will benefit from ongoing PT, OT, and SLP, can actively participate in 3 hours of therapy a day 5 days of the week, and can make measurable gains during the admission.  Patient will also benefit from the coordinated team approach during an Inpatient Acute Rehabilitation admission.  The patient will receive intensive therapy as well as Rehabilitation physician, nursing, social worker, and care management interventions.  Due to bladder management, bowel management, safety, skin/wound care, disease management, medication administration, pain management, and patient education the patient requires 24 hour a day rehabilitation nursing.  The patient is currently min to mod assist with mobility and basic ADLs.  Discharge setting and therapy post discharge at home with home health is anticipated.  Patient has agreed to participate in the Acute Inpatient Rehabilitation Program and will admit today.  Preadmission Screen Completed By:  Michel Santee, PT, DPT 04/29/2022 2:08 PM ______________________________________________________________________   Discussed status with Dr. Curlene Dolphin on 04/29/22  at 2:08 PM  and received approval for admission today.  Admission Coordinator:  Michel Santee, PT, DPT time 2:08 PM Sudie Grumbling 04/29/22    Assessment/Plan: Diagnosis: R pontine, L hippocampus CVA Does the need for close, 24 hr/day Medical supervision in concert with the patient's rehab needs make it unreasonable for this patient to be served in a less intensive setting? Yes Co-Morbidities requiring supervision/potential complications: HTN, DM, anxiety, chronic systolic CHF (EF 82-50%), PVD, atherosclerosis, tobacco abuse, chronic pain syndrome Due to bladder management, bowel management, safety, skin/wound care, disease management, medication administration, pain management, and  patient education, does the patient require 24 hr/day rehab nursing? Yes Does the patient require coordinated care of a physician, rehab nurse, PT, OT, and SLP to address physical and functional deficits in the context of the above medical diagnosis(es)? Yes Addressing deficits in the following areas: balance, endurance, locomotion, strength, transferring, bowel/bladder control, bathing, dressing, feeding, grooming, toileting, cognition, speech, language, swallowing, and psychosocial support Can the patient actively participate in an intensive therapy program of  at least 3 hrs of therapy 5 days a week? Yes The potential for patient to make measurable gains while on inpatient rehab is good Anticipated functional outcomes upon discharge from inpatient rehab: modified independent and supervision PT, modified independent and supervision OT, modified independent and supervision SLP Estimated rehab length of stay to reach the above functional goals is: 12-14 days  Anticipated discharge destination: Home 10. Overall Rehab/Functional Prognosis: good   MD Signature: Jennye Boroughs MD

## 2022-04-29 NOTE — Progress Notes (Signed)
Inpatient Rehab Admissions Coordinator:   ? ?I have insurance approval and a bed available for pt to admit to CIR today. Dr. Jerral Ralph in agreement.  Will let pt/family and TOC team know.  ? ?Estill Dooms, PT, DPT ?Admissions Coordinator ?[phone] ?04/29/22  ?10:27 AM  ? ?

## 2022-04-29 NOTE — Progress Notes (Signed)
Inpatient Rehabilitation Admission Medication Review by a Pharmacist ? ?A complete drug regimen review was completed for this patient to identify any potential clinically significant medication issues. ? ?High Risk Drug Classes Is patient taking? Indication by Medication  ?Antipsychotic Yes Compazine prn N/V  ?Anticoagulant Yes Sq heparin for VTE ppx  ?Antibiotic No   ?Opioid No   ?Antiplatelet Yes Plavix for PAD, CVA  ?Hypoglycemics/insulin Yes Marcelline Deist for DM  ?Vasoactive Medication Yes Metoprolol for BP  ?Chemotherapy No   ?Other Yes Lipitor, Zetia for HLD ?Protonix for GERD ?Lyrica for neuropathy/pain syndrome  ? ? ? ?Type of Medication Issue Identified Description of Issue Recommendation(s)  ?Drug Interaction(s) (clinically significant) ?    ?Duplicate Therapy ?    ?Allergy ?    ?No Medication Administration End Date ?    ?Incorrect Dose ?    ?Additional Drug Therapy Needed ?    ?Significant med changes from prior encounter (inform family/care partners about these prior to discharge). Losartan PTA To resume if and when appropriate  ?Other ?    ? ? ?Clinically significant medication issues were identified that warrant physician communication and completion of prescribed/recommended actions by midnight of the next day:  No ? ?Pharmacist comments: None ? ?Time spent performing this drug regimen review (minutes):  20 minutes ? ? ?Erin Jackson ?04/29/2022 1:37 PM ?
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
## 2022-04-29 NOTE — Progress Notes (Signed)
Physical Therapy Treatment ?Patient Details ?Name: Erin Jackson ?MRN: [id number] ?DOB: 05/03/1966 ?Today's Date: 04/29/2022 ? ? ?History of Present Illness Pt is a 56 y/o F presenting to [facility] on 5/2 with n/v and SOB. Found to have encephalopathy and seizure-like activity in the context of severe HTN. MRI shows small acute infarcts of left frontal white matter, hippocampus, and R pons. Pt in the ICU to monitor her tachycardia, HTN, and seizure activity.  CT head negative. PMH of HTN, DM, Anxiety, GERD, and recent heart cath on 4/20. ? ?  ?PT Comments  ? ? Patient highly motivated and made excellent progress today. Transitioned to using RW (instead of hallway rail) and pt able to walk 80 ft! Requires education for proper, safe use of RW and cues for step length and left knee control (tends to hyperextend, but can demonstrate control when she focuses on it).  ?   ?Recommendations for follow up therapy are one component of a multi-disciplinary discharge planning process, led by the attending physician.  Recommendations may be updated based on patient status, additional functional criteria and insurance authorization. ? ?Follow Up Recommendations ? Acute inpatient rehab (3hours/day) ?  ?  ?Assistance Recommended at Discharge Frequent or constant Supervision/Assistance  ?Patient can return home with the following Assistance with cooking/housework;Direct supervision/assist for medications management;Direct supervision/assist for financial management;Assist for transportation;Help with stairs or ramp for entrance;A little help with walking and/or transfers ?  ?Equipment Recommendations ? Rolling walker (2 wheels);BSC/3in1  ?  ?Recommendations for Other Services   ? ? ?  ?Precautions / Restrictions Precautions ?Precautions: Fall ?Precaution Comments: per Ralene Bathe, PA, no limitations to L shoulder s/p rotator cuff repair in March ?Restrictions ?Weight Bearing Restrictions: No  ?  ? ?Mobility ? Bed Mobility ?  ?  ?  ?  ?  ?   ?  ?  ?  ? ?Transfers ?Overall transfer level: Needs assistance ?Equipment used: Rolling walker (2 wheels) ?Transfers: Sit to/from Stand ?Sit to Stand: Min guard ?  ?  ?  ?  ?  ?General transfer comment: vc for sequencing with RW ?  ? ?Ambulation/Gait ?Ambulation/Gait assistance: Min assist (+2 for chair follow) ?Gait Distance (Feet): 100 Feet ?Assistive device: Rolling walker (2 wheels) (Pt utilizing railing on the R side in the hall) ?Gait Pattern/deviations: Knee hyperextension - left, Decreased weight shift to left, Trunk flexed, Narrow base of support, Decreased dorsiflexion - left, Step-through pattern, Step-to pattern, Decreased step length - right, Decreased stance time - left ?Gait velocity: Decreased ?  ?  ?General Gait Details: good upright posture and proximity to RW; vc for turning to take smaller steps; vc for "soft knee" on left with occasional hyperextension happening ? ? ?Stairs ?  ?  ?  ?  ?  ? ? ?Wheelchair Mobility ?  ? ?Modified Rankin (Stroke Patients Only) ?Modified Rankin (Stroke Patients Only) ?Pre-Morbid Rankin Score: Slight disability ?Modified Rankin: Moderately severe disability ? ? ?  ?Balance Overall balance assessment: Needs assistance ?Sitting-balance support: Feet supported ?Sitting balance-Leahy Scale: Good ?Sitting balance - Comments: reaching forward outside BOS ?  ?Standing balance support: Bilateral upper extremity supported, Reliant on assistive device for balance ?Standing balance-Leahy Scale: Poor ?  ?  ?  ?  ?  ?  ?  ?  ?  ?  ?  ?  ?  ? ?  ?Cognition Arousal/Alertness: Awake/alert ?Behavior During Therapy: [facility] for tasks assessed/performed ?Overall Cognitive Status: Impaired/Different from baseline ?Area of Impairment: Awareness, Safety/judgement ?  ?  ?  ?  ?  ?  ?  ?  ?  ?  ?  ?  ?  Safety/Judgement: Decreased awareness of safety ?Awareness: Emergent ?Problem Solving: Requires verbal cues ?  ?  ?  ? ?  ?Exercises   ? ?  ?General Comments   ?  ?  ? ?Pertinent Vitals/Pain  Pain Assessment ?Pain Assessment: Faces ?Faces Pain Scale: Hurts little more ?Breathing: normal ?Negative Vocalization: none ?Facial Expression: smiling or inexpressive ?Body Language: relaxed ?Consolability: no need to console ?PAINAD Score: 0 ?Pain Location: LUE with weight-bearing on RW ?Pain Descriptors / Indicators: Grimacing, Guarding ?Pain Intervention(s): Limited activity within patient's tolerance, Monitored during session  ? ? ?Home Living   ?  ?  ?  ?  ?  ?  ?  ?  ?  ?   ?  ?Prior Function    ?  ?  ?   ? ?PT Goals (current goals can now be found in the care plan section) Acute Rehab PT Goals ?Patient Stated Goal: walk again ?PT Goal Formulation: With patient ?Time For Goal Achievement: 05/06/22 ?Potential to Achieve Goals: Fair ?Progress towards PT goals: Progressing toward goals ? ?  ?Frequency ? ? ? Min 4X/week ? ? ? ?  ?PT Plan Current plan remains appropriate  ? ? ?Co-evaluation   ?  ?  ?  ?  ? ?  ?AM-PAC PT "6 Clicks" Mobility   ?Outcome Measure ? Help needed turning from your back to your side while in a flat bed without using bedrails?: A Little ?Help needed moving from lying on your back to sitting on the side of a flat bed without using bedrails?: A Lot ?Help needed moving to and from a bed to a chair (including a wheelchair)?: A Little ?Help needed standing up from a chair using your arms (e.g., wheelchair or bedside chair)?: A Little ?Help needed to walk in hospital room?: A Little ?Help needed climbing 3-5 steps with a railing? : Total ?6 Click Score: 15 ? ?  ?End of Session Equipment Utilized During Treatment: Gait belt ?Activity Tolerance: Patient tolerated treatment well ?Patient left: with call bell/phone within reach;in bed (sitting EOB and will call when she wants to lie down) ?Nurse Communication: Mobility status ?PT Visit Diagnosis: Unsteadiness on feet (R26.81);Dizziness and giddiness (R42);Muscle weakness (generalized) (M62.81) ?  ? ? ?Time: 1856-3149 ?PT Time Calculation (min)  (ACUTE ONLY): 20 min ? ?Charges:  $Gait Training: 8-22 mins          ?          ? ? ?Jerolyn Center, PT ?Acute Rehabilitation Services  ?Pager [phone] ?Office [phone] ? ? ? ?Scherrie November Darnise Montag ?04/29/2022, 1:59 PM ? ?

## 2022-04-30 LAB — CBC WITH DIFFERENTIAL/PLATELET
Abs Immature Granulocytes: 0 10*3/uL (ref 0.00–0.07)
Basophils Absolute: 0.1 10*3/uL (ref 0.0–0.1)
Basophils Relative: 1 %
Eosinophils Absolute: 0.2 10*3/uL (ref 0.0–0.5)
Eosinophils Relative: 2 %
HCT: 27.9 % — ABNORMAL LOW (ref 36.0–46.0)
Hemoglobin: 9 g/dL — ABNORMAL LOW (ref 12.0–15.0)
Lymphocytes Relative: 40 %
Lymphs Abs: 3.4 10*3/uL (ref 0.7–4.0)
MCH: 29 pg (ref 26.0–34.0)
MCHC: 32.3 g/dL (ref 30.0–36.0)
MCV: 90 fL (ref 80.0–100.0)
Monocytes Absolute: 0.4 10*3/uL (ref 0.1–1.0)
Monocytes Relative: 5 %
Neutro Abs: 4.5 10*3/uL (ref 1.7–7.7)
Neutrophils Relative %: 52 %
Platelets: 308 10*3/uL (ref 150–400)
RBC: 3.1 MIL/uL — ABNORMAL LOW (ref 3.87–5.11)
RDW: 13.5 % (ref 11.5–15.5)
WBC: 8.6 10*3/uL (ref 4.0–10.5)
nRBC: 0 % (ref 0.0–0.2)
nRBC: 0 /100 WBC

## 2022-04-30 LAB — COMPREHENSIVE METABOLIC PANEL
ALT: 20 U/L (ref 0–44)
AST: 15 U/L (ref 15–41)
Albumin: 2.1 g/dL — ABNORMAL LOW (ref 3.5–5.0)
Alkaline Phosphatase: 71 U/L (ref 38–126)
Anion gap: 8 (ref 5–15)
BUN: 15 mg/dL (ref 6–20)
CO2: 24 mmol/L (ref 22–32)
Calcium: 8.4 mg/dL — ABNORMAL LOW (ref 8.9–10.3)
Chloride: 106 mmol/L (ref 98–111)
Creatinine, Ser: 1.41 mg/dL — ABNORMAL HIGH (ref 0.44–1.00)
GFR, Estimated: 43 mL/min — ABNORMAL LOW (ref >60)
Glucose, Bld: 121 mg/dL — ABNORMAL HIGH (ref 70–99)
Potassium: 4.2 mmol/L (ref 3.5–5.1)
Sodium: 138 mmol/L (ref 135–145)
Total Bilirubin: 0.2 mg/dL — ABNORMAL LOW (ref 0.3–1.2)
Total Protein: 6 g/dL — ABNORMAL LOW (ref 6.5–8.1)

## 2022-04-30 LAB — GLUCOSE, CAPILLARY
Glucose-Capillary: 117 mg/dL — ABNORMAL HIGH (ref 70–99)
Glucose-Capillary: 294 mg/dL — ABNORMAL HIGH (ref 70–99)
Glucose-Capillary: 141 mg/dL — ABNORMAL HIGH (ref 70–99)
Glucose-Capillary: 195 mg/dL — ABNORMAL HIGH (ref 70–99)
Glucose-Capillary: 411 mg/dL — ABNORMAL HIGH (ref 70–99)

## 2022-04-30 MED ORDER — HYDROCODONE-ACETAMINOPHEN 5-325 MG PO TABS
1.0000 | ORAL_TABLET | Freq: Three times a day (TID) | ORAL | Status: DC | PRN
  Administered 2022-04-30 – 2022-05-06 (×9): 1 via ORAL
  Filled 2022-04-30 (×10): qty 1

## 2022-04-30 MED ORDER — ALPRAZOLAM 0.5 MG PO TABS: 2.0000 mg | ORAL_TABLET | Freq: Two times a day (BID) | ORAL | Status: DC | PRN

## 2022-04-30 MED ORDER — INSULIN ASPART 100 UNIT/ML IJ SOLN
0.0000 [IU] | Freq: Three times a day (TID) | INTRAMUSCULAR | Status: DC
  Administered 2022-04-30: 2 [IU] via SUBCUTANEOUS
  Administered 2022-04-30: 15 [IU] via SUBCUTANEOUS
  Administered 2022-04-30: 3 [IU] via SUBCUTANEOUS
  Administered 2022-05-01: 8 [IU] via SUBCUTANEOUS
  Administered 2022-05-01: 11 [IU] via SUBCUTANEOUS
  Administered 2022-05-01 (×2): 3 [IU] via SUBCUTANEOUS
  Administered 2022-05-02: 2 [IU] via SUBCUTANEOUS
  Administered 2022-05-02 (×2): 5 [IU] via SUBCUTANEOUS
  Administered 2022-05-02: 2 [IU] via SUBCUTANEOUS
  Administered 2022-05-03 (×2): 3 [IU] via SUBCUTANEOUS
  Administered 2022-05-03 – 2022-05-04 (×4): 2 [IU] via SUBCUTANEOUS
  Administered 2022-05-04: 3 [IU] via SUBCUTANEOUS
  Administered 2022-05-04 – 2022-05-05 (×2): 5 [IU] via SUBCUTANEOUS
  Administered 2022-05-05 (×3): 3 [IU] via SUBCUTANEOUS
  Administered 2022-05-06: 5 [IU] via SUBCUTANEOUS
  Administered 2022-05-06: 3 [IU] via SUBCUTANEOUS
  Administered 2022-05-06: 2 [IU] via SUBCUTANEOUS
  Administered 2022-05-07: 3 [IU] via SUBCUTANEOUS
  Administered 2022-05-07 (×3): 2 [IU] via SUBCUTANEOUS

## 2022-04-30 MED ORDER — MECLIZINE HCL 25 MG PO TABS
12.5000 mg | ORAL_TABLET | Freq: Two times a day (BID) | ORAL | Status: DC | PRN
  Administered 2022-05-05 – 2022-05-07 (×2): 12.5 mg via ORAL
  Filled 2022-04-30 (×2): qty 1

## 2022-04-30 MED ORDER — ALPRAZOLAM 0.5 MG PO TABS
2.0000 mg | ORAL_TABLET | Freq: Two times a day (BID) | ORAL | Status: DC | PRN
  Administered 2022-04-30 – 2022-05-08 (×13): 2 mg via ORAL
  Filled 2022-04-30 (×13): qty 4

## 2022-04-30 NOTE — Progress Notes (Signed)
Inpatient Rehabilitation Care Coordinator ?Assessment and Plan ?Patient Details  ?Name: Erin Jackson ?MRN: [id number] ?Date of Birth: 28-Oct-1966 ? ?Today's Date: 04/30/2022 ? ?Hospital Problems: Principal Problem: ?  Embolic stroke ([place]) ?Active Problems: ?  Primary hypertension ?  Stage 3b chronic kidney disease ([place]) ?  Chronic systolic heart failure ([place]) ? ?Past Medical History:  ?Past Medical History:  ?Diagnosis Date  ? Anxiety   ? Chronic back pain   ? Diabetes mellitus without complication ([place])   ? GERD (gastroesophageal reflux disease)   ? Hypertension   ? ?Past Surgical History:  ?Past Surgical History:  ?Procedure Laterality Date  ? ABDOMINAL HYSTERECTOMY    ? BREAST LUMPECTOMY Left 03/25/2022  ? Procedure: LEFT BREAST LUMPECTOMY;  Surgeon: Jovita Kussmaul, MD;  Location: [place];  Service: General;  Laterality: Left;  ? BREAST REDUCTION SURGERY Bilateral 08/15/2018  ? Procedure: BILATERAL MAMMARY REDUCTION  (BREAST);  Surgeon: Cristine Polio, MD;  Location: [place];  Service: Plastics;  Laterality: Bilateral;  ? BREAST REDUCTION SURGERY Left 09/25/2019  ? Procedure: EXCISION OF FAT NECROSIS OF LEFT BREAST;  Surgeon: Cristine Polio, MD;  Location: [place];  Service: Plastics;  Laterality: Left;  ? CARPAL TUNNEL RELEASE Right 06/17/2020  ? right thumb CMC arthroplasty with tendon transfer and right carpal tunnel release.   ? CERVICAL SPINE SURGERY  2015  ? has a metal plate  ? FOOT SURGERY Left 2014  ? HAND SURGERY    ? LEFT HEART CATH AND CORONARY ANGIOGRAPHY N/A 04/09/2022  ? Procedure: LEFT HEART CATH AND CORONARY ANGIOGRAPHY;  Surgeon: Dixie Dials, MD;  Location: [place] [facility];  Service: Cardiovascular;  Laterality: N/A;  ? REDUCTION MAMMAPLASTY Bilateral 07/2018  ? ?Social History:  reports that she has been smoking cigarettes. She has been smoking an average of .5 packs per day. She has never used smokeless tobacco. She reports current  alcohol use. She reports that she does not use drugs. ? ?Family / Support Systems ?Marital Status: Divorced ?Patient Roles: Parent, Other (Comment) (sibling) ?Children: George-son primary contact [phone]-cell  [name]-son whom lives with pt [phone]  Lonnie-son ?Other Supports: Jotrice-sister [phone]  Natalie-sister [phone] ?Anticipated Caregiver: Family members ?Ability/Limitations of Caregiver: Maren Beach works nights and sleeps most of the day according to pt  [name] lives in [place] and have given private duty list for hired assist ?Caregiver Availability: Other (Comment) (Family aware pt will require 24/7 care at [place]) ?Family Dynamics: Close with her three son's, and sister's. She also has girlfriends who are involved and extended family who have helped her ? ?Social History ?Preferred language: English ?Religion: Baptist ?Cultural Background: No issues ?Education: HS ?Health Literacy - How often do you need to have someone help you when you read instructions, pamphlets, or other written material from your doctor or pharmacy?: Never ?Writes: Yes ?Employment Status: Disabled ?Legal History/Current Legal Issues: No issues ?Guardian/Conservator: None-according to MD pt is capable of making her own decisions while here  ? ?Abuse/Neglect ?Abuse/Neglect Assessment Can Be Completed: Yes ?Physical Abuse: Denies ?Verbal Abuse: Denies ?Sexual Abuse: Denies ?Exploitation of patient/patient's resources: Denies ?Self-Neglect: Denies ? ?Patient response to: ?Social Isolation - How often do you feel lonely or isolated from those around you?: Never ? ?Emotional Status ?Pt's affect, behavior and adjustment status: Pt is willing to work and hopes to recover from this stroke. She is glad it is on her left side non-dominant and shoulder surgery side so she has her right arm to  assist her. She has been in and out of the hospital the past few months and feels she needs to change her PCP and get a real MD to manage all of her health  issues. ?Recent Psychosocial Issues: recent hospitalizations and shoulder surgery 02/2022 ?Psychiatric History: History of bipolar does not take medications for this treid but did not agree with her. She als has a history of anxiety she does take medication for this and finds it helpful. ?Substance Abuse History: Does use THC and is aware of the risks with her other medicaitons-attempting to quit tobacco also ? ?Patient / Family Perceptions, Expectations & Goals ?Pt/Family understanding of illness & functional limitations: Pt is able to explain her stroke and deficits, she is glad it is her non-dominate side. She does talk with the MD and feels she understands the process going forward. She is not one to shy back and not ask questions. ?Premorbid pt/family roles/activities: Mom, sister, girlfriend, aunt, church member, etc ?Anticipated changes in roles/activities/participation: resume ?Pt/family expectations/goals: Pt states: " I want to be able to take care of myself and not need help, but I have been told I will need help." ? ?Community Resources ?Community Agencies: None ?Premorbid Home Care/DME Agencies: None ?Transportation available at discharge: Pt does not drive uses medical transport and family members ?Is the patient able to respond to transportation needs?: Yes ?In the past 12 months, has lack of transportation kept you from medical appointments or from getting medications?: No ?In the past 12 months, has lack of transportation kept you from meetings, work, or from getting things needed for daily living?: No ?Resource referrals recommended: Neuropsychology ? ?Discharge Planning ?Living Arrangements: Children ?Support Systems: Children, Other relatives, Friends/neighbors, Church/faith community ?Type of Residence: Private residence ?Insurance Resources: [employer]imedia programmer (specify), Medicaid (specify county) [facility] Medicare) ?Financial Resources: SSD ?Financial Screen Referred: No ?Living Expenses: Rent ?Money  Management: Patient ?Does the patient have any problems obtaining your medications?: No ?Home Management: Pt ?Patient/Family Preliminary Plans: Return home or go to [place] in [place], pt not sure where she is going at discharge. Pt and family will need to come up with a plan, they are aware she will need 24/7 care and will need to get together on a plan. ?Care Coordinator Barriers to Discharge: Lack of/limited family support, Insurance for SNF coverage ?Care Coordinator Anticipated Follow Up Needs: HH/OP ? ?Clinical Impression ?Pleasant motivated female who has always been independent even with her multiple health issues. She did have assist for B & D due to her recent shoulder surgery.Aware being evaluated and goals being set. Family informed prior to admission she will need 24/7 care at [place]. Awaiting Georg-son to return my call-pt reports he is her contact. Work on discharge needs. ? ?Elease Hashimoto ?04/30/2022, 12:00 PM ? ?  ?

## 2022-04-30 NOTE — Progress Notes (Signed)
Patient ID: Erin Jackson, female   DOB: 04-19-1966, 56 y.o.   MRN: [id number] ?Met with the patient to review rehab process, team conference and plan of care. Reviewed secondary risk management including DM (A1C 6.8), HTN, HLD (LDL 102/Trig225) and HF. Reviewed medications and dietary modification recommendations. Patient noted she was following a low salt diet PTA and she has several allergies that affect protein intake. Reports numbness and pain in left leg knee to foot that impairs activity. She has been sedentary and depressed over the past three months or so. Referred to neuro psych for eval. Continue to follow along to discharge to address educational needs to facilitate preparation for discharge. Erin Jackson ? ?

## 2022-04-30 NOTE — Discharge Summary (Signed)
Physician Discharge Summary  Patient ID: Erin Jackson MRN: [id number] DOB/AGE: 1966/12/20 56 y.o.  Admit date: 04/29/2022 Discharge date: 05/08/2022  Discharge Diagnoses:  Principal Problem:   Embolic stroke [facility]) Active Problems:   Primary hypertension   Stage 3b chronic kidney disease ([place])   Chronic systolic heart failure (HCC) Anxiety Bipolar disorder Hypertension DM-2 CAD Hyperlipidemia PAD Obesity GERD Anemia Low magnesium  Discharged Condition: good  Significant Diagnostic Studies:  Labs:  Basic Metabolic Panel:    Latest Ref Rng & Units 05/04/2022    7:46 AM 05/01/2022    5:50 AM 04/30/2022    5:29 AM  CBC  WBC 4.0 - 10.5 K/uL 7.3    8.6    Hemoglobin 12.0 - 15.0 g/dL 9.6   9.1   9.0    Hematocrit 36.0 - 46.0 % 31.4   28.5   27.9    Platelets 150 - 400 K/uL 389    308         Latest Ref Rng & Units 05/08/2022    5:26 AM 05/07/2022    5:22 AM 05/04/2022    7:46 AM  BMP  Glucose 70 - 99 mg/dL 146   132   216    BUN 6 - 20 mg/dL '21   21   21    ' Creatinine 0.44 - 1.00 mg/dL 1.06   1.32   1.35    Sodium 135 - 145 mmol/L 140   139   136    Potassium 3.5 - 5.1 mmol/L 4.4   3.9   4.0    Chloride 98 - 111 mmol/L 108   107   104    CO2 22 - 32 mmol/L '24   23   22    ' Calcium 8.9 - 10.3 mg/dL 8.3   7.9   8.3      CBG (last 3)  Recent Labs    05/07/22 1659 05/07/22 2057 05/08/22 0530  GLUCAP 130* 195* 98      Brief HPI:   Erin Jackson is a 56 y.o. female who presented to the emergency department complaining of generalized weakness, dizziness, headache and shortness of breath.  She was found to have elevated systolic blood pressure into the 200s and persistent tachycardia in the 130s.  I am increasingly more unresponsive she was started on nicardipine drip critical care medicine consulted.  Encephalopathy was felt most likely due to malignant hypertension versus PRES.  The following day she endorsed right facial numbness and left hand and foot numbness.   Imaging was significant for hypodensity of the right pons and she was started on antiplatelet therapy with Plavix.  Her MRI was consistent with right pontine infarct and punctate left hypocampus and frontal white matter infarcts.   Hospital Course: Erin Jackson was admitted to rehab 04/29/2022 for inpatient therapies to consist of PT, ST and OT at least three hours five days a week. Past admission physiatrist, therapy team and rehab RN have worked together to provide customized collaborative inpatient rehab.  The patient remained quite dissatisfied with diet restrictions, particularly seasonings.  We discussed salt and carbohydrate intake and over time how not adhering to these restrictions may increase stroke risk.  She relayed how her blood sugars have historically had high and low swings.  We decided to ease the dietary restrictions and continue to closely monitor and ask her to pay attention to appropriate food choices.  Developed bothersome urinary incontinence on 5/15 and bladder program was initiated.  She also underwent neuropsychology evaluation on that day. Magnesium gluconate 250 mg started at bedtime on and restarted clonidine 0.1 mg BID. Lyrica increased to 75 mg daily. Patient is refusing prescriptions for [place] and Zetia. Refused further magnesium IV on 5/19.   Blood pressures were monitored on TID basis and remained well controlled on Lopressor 50 mg twice daily.  This dose was increased to 100 mg twice daily on 5/14. Home clonidine 0.1 mg BID started on 5/17.  Diabetes has been monitored with ac/hs CBG checks and SSI was use prn for tighter BS control.  Metformin 500 mg twice daily as well as Farxiga 5 mg daily continued. Metformin increase to 1000 mg BID.   Rehab course: During patient's stay in rehab weekly team conferences were held to monitor patient's progress, set goals and discuss barriers to discharge. At admission, patient required supervision/verbal cuing, min assist for  mobility and transfers.  She  has had improvement in activity tolerance, balance, postural control as well as ability to compensate for deficits. She has had improvement in functional use RUE/LUE  and RLE/LLE as well as improvement in awareness  Discussed possible use of Topamax to help with lower extremity pain. Will continue Lyrica at night at time of discharge and she will contact Dr. Ranell Patrick should she decide she needs this additional medicine.  SW will attempt to find patient new PCP per patient request.  Disposition: Home Discharge disposition: 01-Home or Self Care      Diet: Carb modified/heart healthy  Special Instructions:  Discussed responsible, careful use of narcotics and benzodiazepines and central nervous depression and slowing of respiratory drive if not used as prescribed. She follows with Dr. Royce Macadamia for pain management and has Narcan at home.  No driving, alcohol consumption or tobacco use.   30-35 minutes were spent on discharge planning and discharge summary.   Discharge Instructions     Ambulatory referral to Neurology   Complete by: As directed    An appointment is requested in approximately: 4 weeks   Ambulatory referral to Neuropsychology   Complete by: As directed    Hospital follow-up   Ambulatory referral to Physical Medicine Rehab   Complete by: As directed    Hospital follow-up   Ambulatory referral to Physical Medicine Rehab   Complete by: As directed    Discharge patient   Complete by: As directed    Discharge disposition: 01-Home or Self Care   Discharge patient date: 05/08/2022        Allergies as of 05/08/2022       Reactions   Cucumber Extract Anaphylaxis   Molds & Smuts Shortness Of Breath   Norvasc [amlodipine] Other (See Comments)   Muscle became very tight   Other Anaphylaxis, Other (See Comments)   Tree nuts, walnuts, peanuts, pickles   Apple Juice Hives   Aspirin Rash   Ibuprofen Rash   Niacin And Related Rash         Medication List     STOP taking these medications    albuterol 108 (90 Base) MCG/ACT inhaler Commonly known as: VENTOLIN HFA   dapagliflozin propanediol 5 MG Tabs tablet Commonly known as: FARXIGA   ezetimibe 10 MG tablet Commonly known as: ZETIA   losartan 25 MG tablet Commonly known as: COZAAR   pantoprazole 40 MG tablet Commonly known as: PROTONIX       TAKE these medications    acetaminophen 325 MG tablet Commonly known as: TYLENOL Take 2 tablets (650 mg total)  by mouth every 4 (four) hours as needed for fever or mild pain. What changed:  medication strength how much to take when to take this reasons to take this   alprazolam 2 MG tablet Commonly known as: XANAX Take 0.5 tablets (1 mg total) by mouth 2 (two) times daily as needed for anxiety.   atorvastatin 80 MG tablet Commonly known as: LIPITOR Take 1 tablet (80 mg total) by mouth daily.   cetirizine 10 MG tablet Commonly known as: ZyrTEC Allergy Take 1 tablet (10 mg total) by mouth daily.   cloNIDine 0.1 MG tablet Commonly known as: CATAPRES Take 1 tablet (0.1 mg total) by mouth 2 (two) times daily.   clopidogrel 75 MG tablet Commonly known as: PLAVIX Take 1 tablet (75 mg total) by mouth daily.   EPINEPHrine 0.3 mg/0.3 mL Soaj injection Commonly known as: EPI-PEN Inject 0.3 mLs (0.3 mg total) into the muscle once as needed (allergic reaction). What changed:  when to take this reasons to take this   HYDROcodone-acetaminophen 5-325 MG tablet Commonly known as: NORCO/VICODIN Take 2 tablets by mouth 3 (three) times daily as needed for severe pain.   magnesium gluconate 500 MG tablet Commonly known as: MAGONATE Take 1 tablet (500 mg total) by mouth 2 (two) times daily with a meal.   magnesium gluconate 500 MG tablet Commonly known as: MAGONATE Take 0.5 tablets (250 mg total) by mouth at bedtime.   meclizine 12.5 MG tablet Commonly known as: ANTIVERT Take 1 tablet (12.5 mg total) by mouth  2 (two) times daily as needed for dizziness.   metFORMIN 500 MG tablet Commonly known as: GLUCOPHAGE Take 1 tablet (500 mg total) by mouth 2 (two) times daily with a meal.   methocarbamol 500 MG tablet Commonly known as: ROBAXIN Take 1 tablet (500 mg total) by mouth every 6 (six) hours as needed for muscle spasms.   metoprolol tartrate 100 MG tablet Commonly known as: LOPRESSOR Take 1 tablet (100 mg total) by mouth 2 (two) times daily. What changed:  medication strength how much to take   Narcan 4 MG/0.1ML Liqd nasal spray kit Generic drug: naloxone Place 0.4 mg into the nose as needed (opoid overdose).   nicotine 14 mg/24hr patch Commonly known as: NICODERM CQ - dosed in mg/24 hours Place 1 patch (14 mg total) onto the skin daily.   nitroGLYCERIN 0.4 MG SL tablet Commonly known as: NITROSTAT Place 1 tablet (0.4 mg total) under the tongue every 5 (five) minutes as needed for chest pain.   pregabalin 75 MG capsule Commonly known as: LYRICA Take 1 capsule (75 mg total) by mouth at bedtime.   prochlorperazine 5 MG tablet Commonly known as: COMPAZINE Take 1-2 tablets (5-10 mg total) by mouth every 6 (six) hours as needed for nausea.   traZODone 150 MG tablet Commonly known as: DESYREL Take 1 tablet (150 mg total) by mouth at bedtime.   Vitamin D (Ergocalciferol) 1.25 MG (50000 UNIT) Caps capsule Commonly known as: DRISDOL Take 1 capsule (50,000 Units total) by mouth every 7 (seven) days.        Follow-up Information     Raulkar, Clide Deutscher, MD Follow up.   Specialty: Physical Medicine and Rehabilitation Why: office will call you to arrange your appt (sent) Contact information: [address] [place] [address] [phone]         Sonia Side., FNP Follow up.   Specialty: Family Medicine Why: Call office to make arrangements for  hospital follow-up appointment Contact information: [place] [address] [phone]          [place] Follow up.   Why: Call office to make arrangements for hospital follow-up appointment Contact information: [place] [address] [phone]        Dixie Dials, MD Follow up.   Specialty: Cardiology Why: Call office to make arrangements for hosptial follow-up appointment, CardioNet monitor Contact information: [place] [address] [phone]         Jovita Kussmaul, MD Follow up.   Specialty: General Surgery Why: Call office to make arrangements for hospital/post-op follow-up appointment Contact information: [place] [address] [phone]                 Signed: Barbie Banner 05/08/2022, 10:03 AM

## 2022-04-30 NOTE — Progress Notes (Signed)
[facility] ?Individual Statement of Services ? ?Patient Name:  Erin Jackson  ?Date:  04/30/2022 ? ?Welcome to the [facility].  Our goal is to provide you with an individualized program based on your diagnosis and situation, designed to meet your specific needs.  With this comprehensive rehabilitation program, you will be expected to participate in at least 3 hours of rehabilitation therapies Monday-Friday, with modified therapy programming on the weekends. ? ?Your rehabilitation program will include the following services:  Physical Therapy (PT), Occupational Therapy (OT), Speech Therapy (ST), 24 hour per day rehabilitation nursing, Therapeutic Recreaction (TR), Neuropsychology, Care Coordinator, Rehabilitation Medicine, Nutrition Services, and Pharmacy Services ? ?Weekly team conferences will be held on Wednesday to discuss your progress.  Your Inpatient Rehabilitation Care Coordinator will talk with you frequently to get your input and to update you on team discussions.  Team conferences with you and your family in attendance may also be held. ? ?Expected length of stay: 7-10 days  Overall anticipated outcome: supervision-CGA with stairs ? ?Depending on your progress and recovery, your program may change. Your Inpatient Rehabilitation Care Coordinator will coordinate services and will keep you informed of any changes. Your Inpatient Rehabilitation Care Coordinator's name and contact numbers are listed  below. ? ?The following services may also be recommended but are not provided by the Inpatient [facility]:  ? ?Home Health Rehabiltiation Services ?Outpatient Rehabilitation Services ? ?  ?Arrangements will be made to provide these services after discharge if needed.  Arrangements include referral to agencies that provide these services. ? ?Your insurance has been verified to be:  [employer]-Medicare & Medicaid ?Your primary doctor is:  Wants to get another one no happy  with her current one ? ?Pertinent information will be shared with your doctor and your insurance company. ? ?Inpatient Rehabilitation Care Coordinator:  Dossie Der, LCSW [phone] or (C) [phone] ? ?Information discussed with and copy given to patient by: Lucy Chris, 04/30/2022, 12:03 PM    ?

## 2022-04-30 NOTE — Progress Notes (Signed)
PMR Admission Coordinator Pre-Admission Assessment  Patient: Erin Jackson is an 56 y.o., female MRN: [id number] DOB: 12/07/1966 Height:   Weight: 94.6 kg  Insurance Information HMO: yes    PPO:      PCP:      IPA:      80/20:      OTHER:  PRIMARY: [employer] Medicare      Policy#: [id number]      Subscriber: pt CM Name: Shelby      Phone#: [phone]     Fax#: [phone] Pre-Cert#: [id number] auth for CIR from Shelby with [employer], updates due to fax listed above on 5/16     Employer:  Benefits:  Phone #: [phone]     Name:  Eff. Date: 12/21/21     Deduct: $0      Out of Pocket Max: $8300 (met $2400.14)      Life Max: n/a CIR: $1556/admit      SNF: 20 full days Outpatient: 80%     Co-Ins: 20% Home Health: 100%      Co-Pay:  DME: 80%     Co-Ins: 20% Providers:  SECONDARY: medicaid [place] access      Policy#: [id number]     Phone#: [phone]  Financial Counselor:       Phone#:   The "Data Collection Information Summary" for patients in Inpatient Rehabilitation Facilities with attached "Privacy Act Statement-Health Care Records" was provided and verbally reviewed with: Patient and Family  Emergency Contact Information Contact Information     Name Relation Home Work Mobile   Ward,George Son [phone]  [phone]   Sadler,[name] Son   [phone]   Mills,Jotrice Sister   [phone]   Vaughn,Natalie Sister   [phone]       Current Medical History  Patient Admitting Diagnosis: R pontine, L hippocampus CVA  History of Present Illness: Pt is a 56 y/o female with PMH of HTN, DM, anxiety, chronic systolic CHF (EF 35-40%), PVD, atherosclerosis, tobacco abuse, chronic pain syndrome presented to [facility] on 5/1 with L facial numbness, dizziness, N/V, SOB, dysuria, HA, and abdominal pain.  In ED: BP progressed to 239/116, tachypnic and tacchycardic, UDS positive for thc, CTA chest/abd/pelvis negative, CT head negative.  Started on cardene and admitted by PCCM.  MRI  completed and showed acute/subacute L frontal, hippocampus, and R pons infarcts.  Suspect due to small vessel disease vs cardioembolic source (neuro recommended 30 day even monitor).  Hgb A1C 6.8.  Pt reporting musculoskeletal chest pain, reproducible by palpation which has resolved with lyrica.  During recent hospitalization she was found to have severe arterial disease in L and mild in R with claudication symptoms.  Vascular consulted but no role for emergent revascularization.  Therapy ongoing and recommendations are for CIR.   Complete NIHSS TOTAL: 5  Patient's medical record from [facility] has been reviewed by the rehabilitation admission coordinator and physician.  Past Medical History  Past Medical History:  Diagnosis Date   Anxiety    Chronic back pain    Diabetes mellitus without complication (HCC)    GERD (gastroesophageal reflux disease)    Hypertension     Has the patient had major surgery during 100 days prior to admission? No  Family History   family history includes Breast cancer in her maternal aunt and maternal grandmother.  Current Medications  Current Facility-Administered Medications:    0.9 %  sodium chloride infusion, , Intravenous, PRN, Gonzales, Nicole, MD, Stopped at 04/22/22 1406   acetaminophen (TYLENOL) tablet 650   mg, 650 mg, Oral, Q4H PRN, Ogan, Okoronkwo U, MD, 650 mg at 04/29/22 0345   ALPRAZolam (XANAX) tablet 0.25 mg, 0.25 mg, Oral, BID PRN, Braswell, Phillip, MD, 0.25 mg at 04/29/22 0911   alum & mag hydroxide-simeth (MAALOX/MYLANTA) 200-200-20 MG/5ML suspension 30 mL, 30 mL, Oral, Q6H PRN, Shalhoub, George J, MD, 30 mL at 04/25/22 0854   atorvastatin (LIPITOR) tablet 80 mg, 80 mg, Oral, Daily, Xu, Jindong, MD, 80 mg at 04/29/22 0909   clopidogrel (PLAVIX) tablet 75 mg, 75 mg, Oral, Daily, Kirkpatrick, McNeill P, MD, 75 mg at 04/29/22 0909   dapagliflozin propanediol (FARXIGA) tablet 5 mg, 5 mg, Oral, Daily, Elgergawy, Dawood S, MD, 5 mg at 04/29/22  0905   diphenhydrAMINE (BENADRYL) 12.5 MG/5ML elixir 12.5 mg, 12.5 mg, Oral, Q6H PRN, Elgergawy, Dawood S, MD, 12.5 mg at 04/29/22 0345   docusate sodium (COLACE) capsule 100 mg, 100 mg, Oral, BID PRN, Desai, Rahul P, PA-C, 100 mg at 04/27/22 2109   ezetimibe (ZETIA) tablet 10 mg, 10 mg, Oral, Daily, Xu, Jindong, MD, 10 mg at 04/29/22 0909   heparin injection 5,000 Units, 5,000 Units, Subcutaneous, Q8H, Desai, Rahul P, PA-C, 5,000 Units at 04/29/22 0645   hydrALAZINE (APRESOLINE) injection 10 mg, 10 mg, Intravenous, Q6H PRN, Shalhoub, George J, MD   insulin aspart (novoLOG) injection 0-15 Units, 0-15 Units, Subcutaneous, Q4H, Desai, Rahul P, PA-C, 5 Units at 04/29/22 0905   LORazepam (ATIVAN) injection 1-2 mg, 1-2 mg, Intravenous, Q1H PRN, Lindzen, Eric, MD, 1 mg at 04/25/22 1829   metoprolol tartrate (LOPRESSOR) injection 2.5-5 mg, 2.5-5 mg, Intravenous, Q3H PRN, Desai, Rahul P, PA-C, 5 mg at 04/25/22 1829   metoprolol tartrate (LOPRESSOR) tablet 50 mg, 50 mg, Oral, BID, Elgergawy, Dawood S, MD, 50 mg at 04/29/22 0909   ondansetron (ZOFRAN) injection 4 mg, 4 mg, Intravenous, Q6H PRN, Desai, Rahul P, PA-C, 4 mg at 04/25/22 0854   pantoprazole (PROTONIX) EC tablet 40 mg, 40 mg, Oral, Daily, Shalhoub, George J, MD, 40 mg at 04/29/22 0909   polyethylene glycol (MIRALAX / GLYCOLAX) packet 17 g, 17 g, Oral, Daily PRN, Desai, Rahul P, PA-C   pregabalin (LYRICA) capsule 50 mg, 50 mg, Oral, Daily, Elgergawy, Dawood S, MD, 50 mg at 04/29/22 0909   promethazine (PHENERGAN) 12.5 mg in sodium chloride 0.9 % 50 mL IVPB, 12.5 mg, Intravenous, Q6H PRN, Gonzales, Nicole, MD, Stopped at 04/22/22 1355   traZODone (DESYREL) tablet 150 mg, 150 mg, Oral, QHS, Braswell, Phillip, MD, 150 mg at 04/28/22 2109  Patients Current Diet:  Diet Order             Diet - low sodium heart healthy           Diet Carb Modified           Diet heart healthy/carb modified Room service appropriate? No; Fluid consistency: Thin   Diet effective now                   Precautions / Restrictions Precautions Precautions: Fall Precaution Comments: per Tracy Shuford, PA, no limitations to L shoulder s/p rotator cuff repair in March Restrictions Weight Bearing Restrictions: No   Has the patient had 2 or more falls or a fall with injury in the past year? Yes  Prior Activity Level Limited Community (1-2x/wk): doesn't drive, no DME but reports could use one, family assists with B/D at baseline  Prior Functional Level Self Care: Did the patient need help bathing, dressing, using the toilet or   eating? Needed some help  Indoor Mobility: Did the patient need assistance with walking from room to room (with or without device)? Independent  Stairs: Did the patient need assistance with internal or external stairs (with or without device)? Needed some help  Functional Cognition: Did the patient need help planning regular tasks such as shopping or remembering to take medications? Needed some help  Patient Information Are you of Hispanic, Latino/a,or Spanish origin?: A. No, not of Hispanic, Latino/a, or Spanish origin What is your race?: B. Black or African American Do you need or want an interpreter to communicate with a doctor or health care staff?: 0. No  Patient's Response To:  Health Literacy and Transportation Is the patient able to respond to health literacy and transportation needs?: Yes Health Literacy - How often do you need to have someone help you when you read instructions, pamphlets, or other written material from your doctor or pharmacy?: Never In the past 12 months, has lack of transportation kept you from medical appointments or from getting medications?: No In the past 12 months, has lack of transportation kept you from meetings, work, or from getting things needed for daily living?: Yes  Home Assistive Devices / Equipment Home Equipment: None  Prior Device Use: Indicate devices/aids used by the  patient prior to current illness, exacerbation or injury? None of the above  Current Functional Level Cognition  Overall Cognitive Status: Impaired/Different from baseline Current Attention Level: Selective Orientation Level: Oriented X4 Following Commands: Follows one step commands with increased time Safety/Judgement: Decreased awareness of safety, Decreased awareness of deficits General Comments: pt report baseline memory deficits, decided to get herself off BSC (moving towards her weak side) when staff did not come quickly enough    Extremity Assessment (includes Sensation/Coordination)  Upper Extremity Assessment: LUE deficits/detail RUE Deficits / Details: Requiring assist to flex biceps, does not activate triceps to control extending, unable to flex/abduct or go against gravity with shoulders due to weakness. RUE Coordination: decreased fine motor, decreased gross motor LUE Deficits / Details: moving fingers freely, 2+/5 wrist and forearm, increased pain in elbow and shoulder LUE: Shoulder pain with ROM LUE Sensation: decreased light touch, decreased proprioception LUE Coordination: decreased fine motor, decreased gross motor  Lower Extremity Assessment: Defer to PT evaluation RLE Deficits / Details: limited antigravity control in open chain, no formal MMT, but pt able to stand LLE Deficits / Details: Pt reports toe and plantar foot decreased sensation, limited antigravity control in open chain, no formal MMT, but pt able to stand    ADLs  Overall ADL's : Needs assistance/impaired Eating/Feeding: Set up, Sitting Eating/Feeding Details (indicate cue type and reason): finger foods, cup with lid Grooming: Set up, Sitting, Wash/dry hands Upper Body Bathing: Maximal assistance, Sitting Lower Body Bathing: Maximal assistance, +2 for physical assistance, +2 for safety/equipment, Sitting/lateral leans, Sit to/from stand Upper Body Dressing : Minimal assistance, Sitting Upper Body  Dressing Details (indicate cue type and reason): to doff front opening gown Lower Body Dressing: Total assistance, Bed level Toilet Transfer: +2 for physical assistance, Minimal assistance, Stand-pivot, BSC/3in1 Toileting- Clothing Manipulation and Hygiene: Supervision/safety, Sitting/lateral lean Toileting - Clothing Manipulation Details (indicate cue type and reason): pt with good thoroughness with pericare in sitting Functional mobility during ADLs: +2 for physical assistance, Moderate assistance General ADL Comments: Pt presents with global weakness, ataxia, dizziness, and visual deficits. Requiring increased assist for all tasks.    Mobility  Overal bed mobility: Needs Assistance Bed Mobility: Supine to Sit Supine to   sit: HOB elevated, Min guard Sit to supine: Mod assist, +2 for physical assistance, +2 for safety/equipment General bed mobility comments: pt using rail and determined to get to EOB without physical assist (although wanted HOB elevated); anticipate would have required min-mod assist if bed flat and no rail    Transfers  Overall transfer level: Needs assistance Equipment used: 1 person hand held assist Transfers: Sit to/from Stand, Bed to chair/wheelchair/BSC Sit to Stand: Min assist Bed to/from chair/wheelchair/BSC transfer type:: Step pivot Stand pivot transfers: +2 physical assistance, Min assist Step pivot transfers: Min assist General transfer comment: step-pivot to her rt to BSC and recliner (blocking left knee for safety without buckling detected); from recliner to rail in hall with incr time and cues for safe hand placement    Ambulation / Gait / Stairs / Wheelchair Mobility  Ambulation/Gait Ambulation/Gait assistance: Mod assist, +2 safety/equipment, Min assist (+2 for chair follow) Gait Distance (Feet): 18 Feet Assistive device:  (Pt utilizing railing on the R side in the hall) Gait Pattern/deviations: Knee hyperextension - left, Decreased weight shift to  left, Trunk flexed, Narrow base of support, Decreased dorsiflexion - left, Step-through pattern, Step-to pattern, Decreased step length - right, Decreased stance time - left General Gait Details: Pt with heavy lean on R railing in hallway. Initially leaning on entire rt forearm, but progressing to hand only on rail. PT on L side facilitating weight shift. Patient able to advance and place Left foot. Knee closely guarded during stance and noted knee hyperextension with weight bearing. VC's for step-by-step sequencing. HR max 130 bpm Gait velocity: Decreased Gait velocity interpretation: <1.31 ft/sec, indicative of household ambulator Pre-gait activities: weight shifting EOB    Posture / Balance Dynamic Sitting Balance Sitting balance - Comments: Requiring min guard to min A support EOB Balance Overall balance assessment: Needs assistance Sitting-balance support: Feet supported, Single extremity supported Sitting balance-Leahy Scale: Fair Sitting balance - Comments: Requiring min guard to min A support EOB Postural control: Posterior lean Standing balance support: Single extremity supported Standing balance-Leahy Scale: Poor Standing balance comment: +2 assist required.    Special needs/care consideration Diabetic management yes   Previous Home Environment (from acute therapy documentation) Living Arrangements: Children Available Help at Discharge: Family, Available PRN/intermittently Type of Home: Apartment Home Layout: One level Home Access: Stairs to enter Entrance Stairs-Rails: None Entrance Stairs-Number of Steps: 3 Bathroom Shower/Tub: Tub/shower unit Bathroom Toilet: Standard  Discharge Living Setting Plans for Discharge Living Setting: Patient's home, Lives with (comment) (son) Type of Home at Discharge: Apartment Discharge Home Layout: One level Discharge Home Access: Stairs to enter Entrance Stairs-Rails: None Entrance Stairs-Number of Steps: 3 Discharge Bathroom  Shower/Tub: Tub/shower unit Discharge Bathroom Toilet: Standard Discharge Bathroom Accessibility: Yes How Accessible: Accessible via walker Does the patient have any problems obtaining your medications?: No  Social/Family/Support Systems Anticipated Caregiver: family: 2 sons George and [name]; 2 sisters Jotrice and Natalie Anticipated Caregiver's Contact Information: George [phone]; Javon [phone] Ability/Limitations of Caregiver: son works night shift, plan to hire caregivers when family not available (will need list from CSW) Caregiver Availability: 24/7 Discharge Plan Discussed with Primary Caregiver: Yes Is Caregiver In Agreement with Plan?: Yes Does Caregiver/Family have Issues with Lodging/Transportation while Pt is in Rehab?: No  Goals Patient/Family Goal for Rehab: PT/OT/SLP supervision to mod I Expected length of stay: 12-14 days Pt/Family Agrees to Admission and willing to participate: Yes Program Orientation Provided & Reviewed with Pt/Caregiver Including Roles  & Responsibilities: Yes  Barriers to Discharge: Insurance   for SNF coverage, Decreased caregiver support, Home environment access/layout  Decrease burden of Care through IP rehab admission: n/a  Possible need for SNF placement upon discharge: Not anticipated.  Pt/family aware of need for 24/7 supervision at discharge and planning to pull together family and hired caregivers.  They would like a list of agencies.  They are aware that SNF is not typically approved by insurance following CIR stay.   Patient Condition: I have reviewed medical records from [facility], spoken with CSW, and patient and son. I met with patient at the bedside for inpatient rehabilitation assessment.  Patient will benefit from ongoing PT, OT, and SLP, can actively participate in 3 hours of therapy a day 5 days of the week, and can make measurable gains during the admission.  Patient will also benefit from the coordinated team approach during  an Inpatient Acute Rehabilitation admission.  The patient will receive intensive therapy as well as Rehabilitation physician, nursing, social worker, and care management interventions.  Due to bladder management, bowel management, safety, skin/wound care, disease management, medication administration, pain management, and patient education the patient requires 24 hour a day rehabilitation nursing.  The patient is currently min to mod assist with mobility and basic ADLs.  Discharge setting and therapy post discharge at home with home health is anticipated.  Patient has agreed to participate in the Acute Inpatient Rehabilitation Program and will admit today.  Preadmission Screen Completed By:  Katelin Kutsch E Jancarlo Biermann, PT, DPT 04/29/2022 10:36 AM ______________________________________________________________________   Discussed status with Dr. Shtridelman on 04/29/22  at 10:50 AM  and received approval for admission today.  Admission Coordinator:  Caylon Saine E Marlei Glomski, PT, DPT time 10:50 AM /Date 04/29/22    Assessment/Plan: Diagnosis: R pontine, L hippocampus CVA Does the need for close, 24 hr/day Medical supervision in concert with the patient's rehab needs make it unreasonable for this patient to be served in a less intensive setting? Yes Co-Morbidities requiring supervision/potential complications: HTN, DM, anxiety, chronic systolic CHF (EF 35-40%), PVD, atherosclerosis, tobacco abuse, chronic pain syndrome Due to bladder management, bowel management, safety, skin/wound care, disease management, medication administration, pain management, and patient education, does the patient require 24 hr/day rehab nursing? Yes Does the patient require coordinated care of a physician, rehab nurse, PT, OT, and SLP to address physical and functional deficits in the context of the above medical diagnosis(es)? Yes Addressing deficits in the following areas: balance, endurance, locomotion, strength, transferring, bowel/bladder  control, bathing, dressing, feeding, grooming, toileting, cognition, speech, language, swallowing, and psychosocial support Can the patient actively participate in an intensive therapy program of at least 3 hrs of therapy 5 days a week? Yes The potential for patient to make measurable gains while on inpatient rehab is good Anticipated functional outcomes upon discharge from inpatient rehab: modified independent and supervision PT, modified independent and supervision OT, modified independent and supervision SLP Estimated rehab length of stay to reach the above functional goals is: 12-14 days  Anticipated discharge destination: Home 10. Overall Rehab/Functional Prognosis: good   MD Signature: Yuri Shtridelman MD   Home 10. Overall Rehab/Functional Prognosis: good     MD Signature: Jennye Boroughs MD

## 2022-04-30 NOTE — Discharge Summary (Incomplete)
Physician Discharge Summary  ?Patient ID: ?Erin Jackson ?MRN: [id number] ?DOB/AGE: [birth year] 18 y.o. ? ?Admit date: 04/29/2022 ?Discharge date:  ? ?Discharge Diagnoses:  ?Principal Problem: ?  Embolic stroke ([place]) ?Active Problems: ?  Primary hypertension ?  Stage 3b chronic kidney disease ([place]) ?  Chronic systolic heart failure ([place]) ?Bipolar disorder ?Anxiety ?DM-2 ?Chronic systolic heart failure ?CAD ?Hyperlipidemia ?PAD ?Food allergies ?Lactose intolerance ? ?Discharged Condition: {condition:18240} ? ?Significant Diagnostic Studies: ?DG Chest 1 View ? ?Result Date: 04/14/2022 ?CLINICAL DATA:  Chest pain and itching. EXAM: CHEST  1 VIEW COMPARISON:  10/04/2018. FINDINGS: The heart is enlarged the mediastinal contour is within normal limits. Subsegmental atelectasis or scarring is noted in the mid left lung. No consolidation, effusion, or pneumothorax. Cervical spinal fusion hardware is noted. Degenerative changes are present in the thoracic spine. IMPRESSION: 1. Mild subsegmental atelectasis or scarring in mid left lung. 2. Cardiomegaly. Electronically Signed   By: Brett Fairy M.D.   On: 04/14/2022 20:06  ? ?CT HEAD WO CONTRAST (5MM) ? ?Result Date: 04/20/2022 ?CLINICAL DATA:  Nonspecific dizziness EXAM: CT HEAD WITHOUT CONTRAST TECHNIQUE: Contiguous axial images were obtained from the base of the skull through the vertex without intravenous contrast. RADIATION DOSE REDUCTION: This exam was performed according to the departmental dose-optimization program which includes automated exposure control, adjustment of the mA and/or kV according to patient size and/or use of iterative reconstruction technique. COMPARISON:  01/20/2017 FINDINGS: Brain: No acute infarct or hemorrhage. Lateral ventricles and midline structures are unremarkable. No acute extra-axial fluid collections. No mass effect. Vascular: No hyperdense vessel or unexpected calcification. Skull: Normal. Negative for fracture or focal lesion.  Sinuses/Orbits: No acute finding. Other: None. IMPRESSION: 1. No acute intracranial process.  Stable exam. Electronically Signed   By: Randa Ngo M.D.   On: 04/20/2022 15:18  ? ?MR ANGIO HEAD WO CONTRAST ? ?Result Date: 04/22/2022 ?CLINICAL DATA:  Provided history: Stroke, follow-up. EXAM: MRA HEAD WITHOUT CONTRAST TECHNIQUE: Angiographic images of the Circle of Willis were acquired using MRA technique without intravenous contrast. COMPARISON:  Brain MRI 04/21/2022. FINDINGS: Anterior circulation: The intracranial internal carotid arteries are patent. Atherosclerotic irregularity of both vessels with no more than mild stenosis. The M1 middle cerebral arteries are patent. No M2 proximal branch occlusion is identified. Atherosclerotic irregularity of the M2 and more distal MCA vessels, bilaterally. Most notably, there is a moderate stenosis within a superior division mid-to-distal M2 left MCA vessel. The anterior cerebral arteries are patent. No intracranial aneurysm is identified. Posterior circulation: The intracranial vertebral arteries are patent. The right vertebral artery is dominant. The basilar artery is patent. The posterior cerebral arteries are patent. Mild atherosclerotic irregularity of both vessels. Sizable posterior communicating arteries with developmentally hypoplastic P1 segments, bilaterally. Anatomic variants: As described. IMPRESSION: No intracranial large vessel occlusion is identified. Intracranial atherosclerotic disease, as described. Most notably, there is a moderate focal stenosis within a superior division mid-to-distal M2 left MCA vessel. Electronically Signed   By: Kellie Simmering D.O.   On: 04/22/2022 15:43  ? ?MR BRAIN WO CONTRAST ? ?Result Date: 04/21/2022 ?CLINICAL DATA:  Mental status change, unknown cause EXAM: MRI HEAD WITHOUT CONTRAST TECHNIQUE: Multiplanar, multiecho pulse sequences of the brain and surrounding structures were obtained without intravenous contrast. COMPARISON:  CT  head Apr 20, 2022.  MRI head November 12, 2020. FINDINGS: Brain: Small acute or subacute infarcts in the left frontal white matter and left hippocampus. Mild edema without mass effect. No evidence of acute hemorrhage, mass lesion, midline  shift, hydrocephalus, or extra-axial fluid collection. Additional mild T2/FLAIR hyperintensities within the white matter, nonspecific but compatible with chronic microvascular ischemic disease. Partially empty sella. Vascular: Major arterial flow voids are maintained at the skull base. Skull and upper cervical spine: Normal marrow signal. Sinuses/Orbits: Clear sinuses.  No acute orbital findings. Other: No mastoid effusions. IMPRESSION: 1. Small acute or subacute infarcts in the left frontal white matter, left hippocampus, and right pons. Mild edema without mass effect. Given involvement of multiple vascular territories, consider embolic etiology. 2. Mild chronic microvascular ischemic disease. 3. Partially empty sella, which is often a normal anatomic variant but can be associated with idiopathic intracranial hypertension. Electronically Signed   By: Margaretha Sheffield M.D.   On: 04/21/2022 16:34  ? ?CARDIAC CATHETERIZATION ? ?Result Date: 04/09/2022 ?  Ost LAD to Prox LAD lesion is 50% stenosed.   Mid Cx lesion is 40% stenosed.   Dist Cx lesion is 50% stenosed. Life style modification for mild to moderate multivessel disease.  ? ?DG Chest Port 1 View ? ?Result Date: 04/22/2022 ?CLINICAL DATA:  Respiratory failure. EXAM: PORTABLE CHEST 1 VIEW COMPARISON:  04/20/2022 and CT chest 04/20/2022. FINDINGS: Trachea is midline. Heart size stable. Increasing bibasilar streaky and patchy airspace opacification. There may be a small left pleural effusion. IMPRESSION: 1. Increasing bibasilar atelectasis. Difficult to exclude developing pneumonia in the right lung base. 2. Possible small left pleural effusion. Electronically Signed   By: Lorin Picket M.D.   On: 04/22/2022 08:34  ? ?DG Chest Port  1 View ? ?Result Date: 04/20/2022 ?CLINICAL DATA:  Pt poor historian. Per PA: Pt complains of shortness of breath. Participates minimally in history, vomiting (clear) in triage, upper abdominal discomfort. EXAM: PORTABLE CHEST - 1 VIEW COMPARISON:  04/14/2022 FINDINGS: Perihilar and bibasilar interstitial edema or infiltrates, worse on right than left, new since previous. Heart size and mediastinal contours are within normal limits. No effusion. Cervical fixation hardware noted. IMPRESSION: New asymmetric perihilar and bibasilar infiltrates or edema, right worse than left. Electronically Signed   By: Lucrezia Europe M.D.   On: 04/20/2022 14:09  ? ?EEG adult ? ?Result Date: 04/21/2022 ?Lora Havens, MD     04/21/2022 12:07 PM Patient Name: MARLEIGH ANGELICO MRN: [id number] Epilepsy Attending: Lora Havens Referring Physician/Provider: Kerney Elbe, MD Date: 04/21/2022 Duration: 22.10 mins Patient history: 56 year old female presenting with encephalopathy and seizure-like activity in the context of severe HTN.  EEG to evaluate for seizure. Level of alertness: Awake,asleep AEDs during EEG study: LEV Technical aspects: This EEG study was done with scalp electrodes positioned according to the 10-20 International system of electrode placement. Electrical activity was acquired at a sampling rate of 500Hz  and reviewed with a high frequency filter of 70Hz  and a low frequency filter of 1Hz . EEG data were recorded continuously and digitally stored. Description: The posterior dominant rhythm consists of 8-9 Hz activity of moderate voltage (25-35 uV) seen predominantly in posterior head regions, symmetric and reactive to eye opening and eye closing. Sleep was characterized by vertex waves, sleep spindles (12 to 14 Hz), maximal frontocentral region.  Hyperventilation and photic stimulation were not performed.   IMPRESSION: This study is within normal limits. No seizures or epileptiform discharges were seen throughout the recording.  Priyanka Barbra Sarks  ? ?VAS [place] ABI WITH/WO TBI ? ?Result Date: 04/09/2022 ? LOWER EXTREMITY DOPPLER STUDY Patient Name:  TYNSLEE NOWAK  Date of Exam:   04/08/2022 Medical Rec #: [id number]  Accession #:    [id number]

## 2022-04-30 NOTE — Progress Notes (Signed)
?                                                       PROGRESS NOTE ? ? ?Subjective/Complaints: ?Asks me to explain to her and her son the type of stroke she had ?Said she gets easily anxious ?Asks if she will be seeing a psychiatrist here ? ?ROS: +LLE pain ? ?Objective: ?  ?No results found. ?Recent Labs  ?  04/30/22 ?3536  ?WBC 8.6  ?HGB 9.0*  ?HCT 27.9*  ?PLT 308  ? ?Recent Labs  ?  04/30/22 ?1443  ?NA 138  ?K 4.2  ?CL 106  ?CO2 24  ?GLUCOSE 121*  ?BUN 15  ?CREATININE 1.41*  ?CALCIUM 8.4*  ? ? ?Intake/Output Summary (Last 24 hours) at 04/30/2022 1309 ?Last data filed at 04/30/2022 0700 ?Gross per 24 hour  ?Intake 118 ml  ?Output --  ?Net 118 ml  ?  ? ?  ? ?Physical Exam: ?Vital Signs ?Blood pressure 138/74, pulse (!) 109, temperature 98 ?F (36.7 ?C), temperature source Oral, resp. rate 18, weight 91.2 kg, SpO2 100 %. ?Gen: no distress, normal appearing ?HEENT: oral mucosa pink and moist, NCAT ?Cardio: Reg rate ?Chest: normal effort, normal rate of breathing ?Abd: soft, non-distended ?Ext: no edema ?Psych: pleasant, normal affect ?Skin: intact ?Neuro:  alert and oriented, No aphasia. Fluent language, able to name objects. Delayed responses. She is following one step commands ?CN 2-12 intact other than decreased facial sensation on the left ?Finger to nose she was slow to initiate but overall normal ?Sensation decreased in L 2nd to 5th toes ?Musculoskeletal: ?IV in both arms ?Tone normal ?No joint swelling noted ?5/5 in RUE and RLE ?4-4+/5 in LLE ?Finger flexion LUE at least 4/5 ?No shoulder abduction noted appears not be limited by pain or effort ?Able to hold elbow flexed against gravity, Minimal active elbow extension noted, Appears to be able to hold arm extended when sitting, limited tested appears to be due to limited effort or pain with exam. Tenderness throughout proximal LUE and pain with passive ROM at elbow and shoulder ?  ? ? ? ?Assessment/Plan: ?1. Functional deficits which require 3+ hours per day of  interdisciplinary therapy in a comprehensive inpatient rehab setting. ?Physiatrist is providing close team supervision and 24 hour management of active medical problems listed below. ?Physiatrist and rehab team continue to assess barriers to discharge/monitor patient progress toward functional and medical goals ? ?Care Tool: ? ?Bathing ?   ?   ?   ?  ?  ?Bathing assist   ?  ?  ?Upper Body Dressing/Undressing ?Upper body dressing   ?  ?   ?Upper body assist   ?   ?Lower Body Dressing/Undressing ?Lower body dressing ? ? ?   ?  ? ?  ? ?Lower body assist   ?   ? ?Toileting ?Toileting    ?Toileting assist Assist for toileting: Independent with assistive device ?Assistive Device Comment: walker ?  ?Transfers ?Chair/bed transfer ? ?Transfers assist ?   ? ?  ?  ?  ?Locomotion ?Ambulation ? ? ?Ambulation assist ? ?   ? ?  ?  ?   ? ?Walk 10 feet activity ? ? ?Assist ?   ? ?  ?   ? ?Walk 50 feet activity ? ? ?Assist   ? ?  ?   ? ? ?  Walk 150 feet activity ? ? ?Assist   ? ?  ?  ?  ? ?Walk 10 feet on uneven surface  ?activity ? ? ?Assist   ? ? ?  ?   ? ?Wheelchair ? ? ? ? ?Assist   ?  ?  ? ?  ?   ? ? ?Wheelchair 50 feet with 2 turns activity ? ? ? ?Assist ? ?  ?  ? ? ?   ? ?Wheelchair 150 feet activity  ? ? ? ?Assist ?   ? ? ?   ? ?Blood pressure 138/74, pulse (!) 109, temperature 98 ?F (36.7 ?C), temperature source Oral, resp. rate 18, weight 91.2 kg, SpO2 100 %. ? ?Medical Problem List and Plan: ?1. Functional deficits secondary to right pontine infarct and punctate left hippocampus and frontal white matter infarcts. Neurology suspects cardioembolic vs small vessel disease source. 30 Day cardionet monitoring to rule out afib ?            -patient may shower ?            -ELOS/Goals: 12-14 days Sup to Mod I with PT/OT/SLP ?2.  Antithrombotics: ?-DVT/anticoagulation:  Pharmaceutical: Heparin ?            -antiplatelet therapy: Plavix. Allergy to aspirin ?3. Pain Management: Tylenol as needed ?-Chronic pain syndrome on Xtampza not  restarted on admission ?-continue Lyrica 50 mg daily ?-restarted Norco 5mg  TID prn ?4. Anxiety: LCSW to evaluate and provide emotional support ?            -antipsychotic agents: n/a ?            -anxiety: restarted home Xanax 2mg  BID as needed.   ?            -patient reported history of bipolar disorder: continue trazodone 150 mg q HS.  She is agreeable to neuropsychiatric evaluation ?5. Neuropsych: This patient is capable of making decisions on her own behalf. ?            --patient reported history of bipolar disorder: continue trazodone 150 mg q HS.  She is agreeable to neuropsychiatric evaluation ?6. Skin/Wound Care: Routine skin care checks ?7. Fluids/Electrolytes/Nutrition: Routine I's and O's and follow-up chemistries ?8: Hypertension: monitor TID/prn ?           --continue Lopressor 50 mg BID ?          -BP overall has been well controlled ?9: Chronic systolic heart failure: no diuretics ?           -Continue metoprolol/losartan/farxiga, consider Entresto if BP tolerates ?10: DM-2: monitor CBGs, carb modified diet ?            --continue Farxiga 5 mg daily ?            --continue metformin 500 mg BID ?            --continue SSI 0-15u ?            -CBGs in 200s-260s, consider increase metformin if remains elevated, discussed diet ?11: CKD 3b: Elevated BUN and creatinine.  Encourage p.o. fluids and follow-up BMP. ?12: CAD: left heart cath on 04/09/2022 Dr. Algie CofferKadakia; no significant stenosis. Discussed healthy diet ?13: Hyperlipidemia: continue Lipitor 80 mg daily, Zetia 10 mg daily ?14: PAD: seen in hospital consultation by Dr. Lenell AntuHawken 04/09/2022: follow-up at discharge for abnormal left ABI ?            --continue Plavix and statin ?15: Tobacco use:  Recently stopped and is not having cravings ?16: Obesity: dietary counseling ?17: Left breat mass: s/p lumpectomy 03/25/2022>>non-malignant.  Incision healing. ?18: Food allergies: citrus/cucumber/apple juice/tree nuts,peanuts/pickles ?19: Lactose intolerant: avoid  dairy ?20: GERD: Continue Protonix ?21. Screening for Vitamin D deficiency: check vitamin D level tomorrow morning ?22. Anemia: repeat Hgb tomorrow ? ?LOS: ?1 days ?A FACE TO FACE EVALUATION WAS PERFORMED ? ?Clint Bolder P Rosealyn Little ?04/30/2022, 1:09 PM  ? ?  ?

## 2022-04-30 NOTE — Progress Notes (Signed)
Inpatient Rehabilitation  Patient information reviewed and entered into eRehab system by Tyianna Menefee Saleha Kalp, OTR/L.   Information including medical coding, functional ability and quality indicators will be reviewed and updated through discharge.    

## 2022-04-30 NOTE — Evaluation (Signed)
Physical Therapy Assessment and Plan  Patient Details  Name: Erin Jackson MRN: [id number] Date of Birth: 1966-01-03  PT Diagnosis: Cognitive deficits, Difficulty walking, Dizziness and giddiness, Hemiparesis non-dominant, Impaired sensation, Muscle weakness, Pain in LLE, and Vertigo Rehab Potential: Good ELOS: 7-10 days   Today's Date: 04/30/2022 PT Individual Time: 2035-5974 PT Individual Time Calculation (min): 4 min    Hospital Problem: Principal Problem:   Embolic stroke ([place]) Active Problems:   Primary hypertension   Stage 3b chronic kidney disease ([place])   Chronic systolic heart failure ([place])   Past Medical History:  Past Medical History:  Diagnosis Date   Anxiety    Chronic back pain    Diabetes mellitus without complication ([place])    GERD (gastroesophageal reflux disease)    Hypertension    Past Surgical History:  Past Surgical History:  Procedure Laterality Date   ABDOMINAL HYSTERECTOMY     BREAST LUMPECTOMY Left 03/25/2022   Procedure: LEFT BREAST LUMPECTOMY;  Surgeon: Jovita Kussmaul, MD;  Location: [facility];  Service: General;  Laterality: Left;   BREAST REDUCTION SURGERY Bilateral 08/15/2018   Procedure: BILATERAL MAMMARY REDUCTION  (BREAST);  Surgeon: Cristine Polio, MD;  Location: [facility];  Service: Plastics;  Laterality: Bilateral;   BREAST REDUCTION SURGERY Left 09/25/2019   Procedure: EXCISION OF FAT NECROSIS OF LEFT BREAST;  Surgeon: Cristine Polio, MD;  Location: [facility];  Service: Plastics;  Laterality: Left;   CARPAL TUNNEL RELEASE Right 06/17/2020   right thumb CMC arthroplasty with tendon transfer and right carpal tunnel release.    CERVICAL SPINE SURGERY  2015   has a metal plate   FOOT SURGERY Left 2014   HAND SURGERY     LEFT HEART CATH AND CORONARY ANGIOGRAPHY N/A 04/09/2022   Procedure: LEFT HEART CATH AND CORONARY ANGIOGRAPHY;  Surgeon: Dixie Dials, MD;  Location: [facility];   Service: Cardiovascular;  Laterality: N/A;   REDUCTION MAMMAPLASTY Bilateral 07/2018    Assessment & Plan Clinical Impression: Patient is a 56 year old female with PMH of HTN, DM, anxiety, chronic systolic CHF (EF 16-38%), PVD, atherosclerosis, tobacco abuse, chronic pain syndrome presented to [facility] on 5/1 with L facial numbness, dizziness, N/V, SOB, dysuria, HA, and abdominal pain.  In ED: BP progressed to 239/116, tachypnic and tacchycardic, UDS positive for thc, CTA chest/abd/pelvis negative, CT head negative.  Started on cardene and admitted by [facility].  MRI completed and showed acute/subacute L frontal, hippocampus, and R pons infarcts.  Suspect due to small vessel disease vs cardioembolic source (neuro recommended 30 day even monitor).  Hgb A1C 6.8.  Pt reporting musculoskeletal chest pain, reproducible by palpation which has resolved with lyrica.  During recent hospitalization she was found to have severe arterial disease in L and mild in R with claudication symptoms.  Vascular consulted but no role for emergent revascularization.  Therapy ongoing and recommendations are for CIR.  Patient transferred to CIR on 04/29/2022 .   Patient currently requires min with mobility secondary to muscle weakness, decreased cardiorespiratoy endurance, unbalanced muscle activation and decreased coordination, decreased attention, decreased problem solving, and decreased memory, vestibular symptoms, and decreased sitting balance, decreased standing balance, decreased postural control, hemiplegia, and decreased balance strategies.  Prior to hospitalization, patient was independent  with mobility and lived with Family in a [place] home.  Home access is 3Stairs to enter.  Patient will benefit from skilled PT intervention to maximize safe functional mobility, minimize fall risk,  and decrease caregiver burden for planned discharge home with intermittent assist.  Anticipate patient will benefit from follow up [place] at  discharge.  PT - End of Session Activity Tolerance: Decreased this session Endurance Deficit: Yes Endurance Deficit Description: rest breaks needed due to decreased endurance PT Assessment Rehab Potential (ACUTE/IP ONLY): Good PT Barriers to Discharge: Decreased caregiver support PT Barriers to Discharge Comments: family only able to provide intermittent support PT Patient demonstrates impairments in the following area(s): Balance;Endurance;Motor;Sensory;Pain PT Transfers Functional Problem(s): Bed Mobility;Bed to Chair;Car;Furniture;Floor PT Locomotion Functional Problem(s): Ambulation;Stairs PT Plan PT Intensity: Minimum of 1-2 x/day ,45 to 90 minutes PT Frequency: 5 out of 7 days PT Duration Estimated Length of Stay: 7-10 days PT Treatment/Interventions: Ambulation/gait training;Balance/vestibular training;Cognitive remediation/compensation;Community reintegration;Discharge planning;Disease management/prevention;DME/adaptive equipment instruction;Functional mobility training;Neuromuscular re-education;Pain management;Patient/family education;Psychosocial support;Stair training;Splinting/orthotics;Therapeutic Activities;Therapeutic Exercise;UE/LE Strength taining/ROM;UE/LE Coordination activities;Wheelchair propulsion/positioning PT Transfers Anticipated Outcome(s): mod I basic transfers and bed mobility PT Locomotion Anticipated Outcome(s): mod I household distances, supervision longer distances and min for stairs without rails (home set up) PT Recommendation Recommendations for Other Services: Neuropsych consult;Vestibular eval Follow Up Recommendations: Home health PT Patient destination: Home Equipment Recommended: Rolling walker with 5" wheels   PT Evaluation Precautions/Restrictions Precautions Precautions: Fall Precaution Comments: per Jenetta Loges, PA, no limitations to L shoulder s/p rotator cuff repair in March Restrictions Weight Bearing Restrictions: No  Pain Pain  Assessment Pain Scale: 0-10 Pain Score: 10-Worst pain ever Pain Type: Acute pain Pain Location: Leg Pain Orientation: Left Pain Descriptors / Indicators: Aching Pain Frequency: Intermittent Pain Onset: On-going Patients Stated Pain Goal: 0 Pain Intervention(s): Medication (See eMAR) Pain Interference Pain Interference Pain Effect on Sleep: 2. Occasionally Pain Interference with Therapy Activities: 2. Occasionally Pain Interference with Day-to-Day Activities: 2. Occasionally Home Living/Prior Functioning Home Living Living Arrangements: Children Available Help at Discharge: Family;Available PRN/intermittently Type of Home: Apartment Home Access: Stairs to enter Entrance Stairs-Number of Steps: 3 Entrance Stairs-Rails: None Home Layout: One level Bathroom Shower/Tub: Chiropodist: Standard  Lives With: Family Prior Function Level of Independence: Independent with gait;Independent with transfers  Able to Take Stairs?: Yes Driving: Yes Vision/Perception  Vision - History Ability to See in Adequate Light: 1 Impaired Vision - Assessment Eye Alignment: Impaired (comment) Ocular Range of Motion: Restricted looking up Alignment/Gaze Preference: Within Defined Limits Tracking/Visual Pursuits: Decreased smoothness of vertical tracking Saccades: Decreased speed of saccadic movement;Impaired - to be further tested in functional context Convergence: Impaired (comment) Diplopia Assessment: Disappears with one eye closed Additional Comments: diplopia, light sensitivity, decreased gaze stabilization, movement intolerance Perception Perception: Within Functional Limits Praxis Praxis: Intact  Cognition Overall Cognitive Status: Impaired/Different from baseline Arousal/Alertness: Awake/alert Orientation Level: Oriented X4 Attention: Selective;Alternating Selective Attention: Appears intact Alternating Attention: Impaired Alternating Attention Impairment:  Functional complex Memory: Impaired Memory Impairment: Decreased short term memory;Decreased recall of new information Decreased Short Term Memory: Functional complex Awareness: Appears intact Problem Solving: Impaired Problem Solving Impairment: Functional complex Executive Function:  (decreased mental flexibility, overwelmed/emotional) Behaviors: Other (comment) Safety/Judgment: Appears intact Sensation Sensation Light Touch: Impaired Detail Light Touch Impaired Details: Impaired LLE Hot/Cold: Not tested Proprioception: Impaired by gross assessment Stereognosis: Impaired by gross assessment Additional Comments: reports tdecreased sensation from knee down and numbness on bottom distal pad of foot Coordination Gross Motor Movements are Fluid and Coordinated: No Fine Motor Movements are Fluid and Coordinated: No Finger Nose Finger Test: significant dysmetria LUE, Decreased gaze stabilization impacts eye hand coord Motor  Motor Motor: Hemiplegia;Abnormal postural alignment and control;Abnormal tone Motor -  Skilled Clinical Observations: Flexed posture - can correct with  cueing   Trunk/Postural Assessment  Cervical Assessment Cervical Assessment: Exceptions to [facility] (forward head; dizziness with head turns) Thoracic Assessment Thoracic Assessment: Exceptions to [facility] (flexed posture) Thoracic AROM Overall Thoracic AROM: Due to premorid status Overall Thoracic AROM Comments: Limited sustained thoracic extension, stays in flexion Lumbar Assessment Lumbar Assessment: Exceptions to [facility] (posterior tilt) Lumbar AROM Overall Lumbar AROM: Due to premorid status Overall Lumbar AROM Comments: Limited sustained anterior pelvic tilt, stays posterior Postural Control Postural Control: Deficits on evaluation Righting Reactions: delayed Protective Responses: delayed  Balance Balance Balance Assessed: Yes Dynamic Sitting Balance Dynamic Sitting - Balance Support: No upper extremity  supported Dynamic Sitting - Level of Assistance: 5: Stand by assistance Dynamic Sitting - Balance Activities: Lateral lean/weight shifting;Forward lean/weight shifting Sitting balance - Comments: reaching forward outside BOS Static Standing Balance Static Standing - Balance Support: Right upper extremity supported;Bilateral upper extremity supported;During functional activity Static Standing - Level of Assistance: 4: Min assist Dynamic Standing Balance Dynamic Standing - Balance Support: Right upper extremity supported;Left upper extremity supported;Bilateral upper extremity supported;During functional activity Dynamic Standing - Level of Assistance: 4: Min assist Dynamic Standing - Balance Activities: Lateral lean/weight shifting;Forward lean/weight shifting Extremity Assessment  RUE Assessment RUE Assessment: Within Functional Limits (H/O RCR but at baseline) General Strength Comments: WFL for basic ADL LUE Assessment LUE Assessment: Exceptions to [facility] Passive Range of Motion (PROM) Comments: Pain in left shoulder and arm with passive motion, worse with active motion - has PROM shoulder tp 110 degrees - beyond - range ois available but painful - recent RCR march 2023 Active Range of Motion (AROM) Comments: In supine less than 10 degrees active motion, in function has at least 45 degrees of forward reach LUE Body System: Neuro;Ortho Brunstrum levels for arm and hand: Arm;Hand Brunstrum level for arm: Stage IV Movement is deviating from synergy Brunstrum level for hand: Stage IV Movements deviating from synergies LUE AROM (degrees) Overall AROM Left Upper Extremity: Deficits LUE Overall AROM Comments: Painful shoulder flexi/ext especially initiating against gravity, Painful elbow flex/ext - does beter with cues to reduce tension Left Shoulder Flexion: 45 Degrees Left Shoulder ABduction: 30 Degrees LUE PROM (degrees) Overall PROM Left Upper Extremity: Deficits LUE Overall PROM Comments:  Recent RCR LUE Strength LUE Overall Strength Comments: NT due to pain and stiffness RLE Assessment RLE Assessment: Within Functional Limits General Strength Comments: decreased muscular endurnace LLE Assessment LLE Assessment: Exceptions to [facility] General Strength Comments: 3 to 3-/5 during formal MMT but with functional activity pt able to sustain and tolerate single limb stance on left with gait/stairs  Care Tool Care Tool Bed Mobility Roll left and right activity   Roll left and right assist level: Supervision/Verbal cueing    Sit to lying activity   Sit to lying assist level: Supervision/Verbal cueing    Lying to sitting on side of bed activity   Lying to sitting on side of bed assist level: the ability to move from lying on the back to sitting on the side of the bed with no back support.: Supervision/Verbal cueing     Care Tool Transfers Sit to stand transfer   Sit to stand assist level: Contact Guard/Touching assist    Chair/bed transfer   Chair/bed transfer assist level: Minimal Assistance - Patient > 75%     Toilet transfer   Assist Level: Minimal Assistance - Patient > 75%    Car transfer   Car transfer assist level: Minimal  Assistance - Patient > 75%      Care Tool Locomotion Ambulation   Assist level: Minimal Assistance - Patient > 75% Assistive device: No Device Max distance: 20'  Walk 10 feet activity   Assist level: Minimal Assistance - Patient > 75% Assistive device: No Device   Walk 50 feet with 2 turns activity Walk 50 feet with 2 turns activity did not occur: Safety/medical concerns (decreased endurnace without device)      Walk 150 feet activity Walk 150 feet activity did not occur: Safety/medical concerns      Walk 10 feet on uneven surfaces activity   Assist level: Minimal Assistance - Patient > 75%    Stairs   Assist level: Minimal Assistance - Patient > 75% Stairs assistive device: 1 hand rail;No device Max number of stairs: 4  Walk  up/down 1 step activity   Walk up/down 1 step (curb) assist level: Minimal Assistance - Patient > 75% Walk up/down 1 step or curb assistive device: 1 hand rail  Walk up/down 4 steps activity   Walk up/down 4 steps assist level: Minimal Assistance - Patient > 75% Walk up/down 4 steps assistive device: 1 hand rail  Walk up/down 12 steps activity Walk up/down 12 steps activity did not occur: Safety/medical concerns      Pick up small objects from floor Pick up small object from the floor (from standing position) activity did not occur: Safety/medical concerns (dizzy)      Wheelchair Is the patient using a wheelchair?: Yes (for energy conservation/time management but will not use at d/c anticipated) Type of Wheelchair: Manual   Wheelchair assist level: Dependent - Patient 0%    Wheel 50 feet with 2 turns activity   Assist Level: Dependent - Patient 0%  Wheel 150 feet activity   Assist Level: Dependent - Patient 0%    Refer to Care Plan for Long Term Goals  SHORT TERM GOAL WEEK 1 PT Short Term Goal 1 (Week 1): = LTGs  Recommendations for other services: Neuropsych and Other: vestibular evaluation  Skilled Therapeutic Intervention Mobility Bed Mobility Bed Mobility: Rolling Left;Supine to Sit;Sit to Supine Rolling Left: Supervision/Verbal cueing Supine to Sit: Supervision/Verbal cueing Sit to Supine: Supervision/Verbal cueing Transfers Transfers: Sit to Stand;Stand to Sit;Stand Pivot Transfers Sit to Stand: Contact Guard/Touching assist Stand to Sit: Contact Guard/Touching assist Stand Pivot Transfers: Minimal Assistance - Patient > 75% Stand Pivot Transfer Details: Tactile cues for weight shifting;Verbal cues for precautions/safety;Verbal cues for technique Transfer (Assistive device): Rolling walker Locomotion  Gait Gait Distance (Feet): 20 Feet Assistive device: None Gait Gait Pattern: Impaired Stairs / Additional Locomotion Stairs: Yes Stairs Assistance: Minimal  Assistance - Patient > 75% Stair Management Technique: One rail Right;One rail Left;Step to pattern;Sideways;Forwards Number of Stairs: 4 Height of Stairs: 6 Ramp: Minimal Assistance - Patient >75% Curb: Minimal Assistance - Patient >75% Wheelchair Mobility Wheelchair Mobility: No  Gait with RW pt able to perform at overall CGA level demonstrating improved step length, increased stance time on L, and increased gait speed. Pt performed gait in and out of bathroom in room to perform toileting tasks with overall CGA for balance and cues for safer positioning of RW. Pt reports issues with feeling dizzy (room spinning, not orthostasis) and took quick intake of symptoms (reports this has been ongoing, "at some point a doctor mentioned vertigo" but did not have treatment, and reports h/o fall x 2) no nystagmus noted during mobility. Pt also reports light sensitivity to light and screens -  gave patient tinted glasses which she reports was helpful when leaving the room. Pt transported to gyms for time management and energy conservation. In structed in simulated car transfer with overall min assist with cues for technique. Pt walked up/down ramp and on level surface without AD x 20' with min assist with decreased weightshift, decreased stance time on L, decreased step length bilaterally, and reports increased pain/tinglyness in LLE (also reports this was happening prior to the CVA). Education provided throughout on overall program, goals, therapy schedule, and team conference/discussions in regards to progress. End of session set up in bed with RN present to administer medication.    Discharge Criteria: Patient will be discharged from PT if patient refuses treatment 3 consecutive times without medical reason, if treatment goals not met, if there is a change in medical status, if patient makes no progress towards goals or if patient is discharged from hospital.  The above assessment, treatment plan, treatment  alternatives and goals were discussed and mutually agreed upon: by patient  Juanna Cao, PT, DPT, CBIS  04/30/2022, 2:50 PM

## 2022-04-30 NOTE — Evaluation (Signed)
Speech Language Pathology Assessment and Plan ? ?Patient Details  ?Name: Erin Jackson ?MRN: [id number] ?Date of Birth: [birth year] ? ?SLP Diagnosis: Cognitive Impairments  ?Rehab Potential: Excellent ?ELOS: 7-10 days  ? ? ?Today's Date: 04/30/2022 ?SLP Individual Time: 9924-2683 ?SLP Individual Time Calculation (min): 60 min ? ? ?Hospital Problem: Principal Problem: ?  Embolic stroke ([place]) ?Active Problems: ?  Primary hypertension ?  Stage 3b chronic kidney disease ([place]) ?  Chronic systolic heart failure ([place]) ? ?Past Medical History:  ?Past Medical History:  ?Diagnosis Date  ? Anxiety   ? Chronic back pain   ? Diabetes mellitus without complication ([place])   ? GERD (gastroesophageal reflux disease)   ? Hypertension   ? ?Past Surgical History:  ?Past Surgical History:  ?Procedure Laterality Date  ? ABDOMINAL HYSTERECTOMY    ? BREAST LUMPECTOMY Left 03/25/2022  ? Procedure: LEFT BREAST LUMPECTOMY;  Surgeon: Jovita Kussmaul, MD;  Location: [place];  Service: General;  Laterality: Left;  ? BREAST REDUCTION SURGERY Bilateral 08/15/2018  ? Procedure: BILATERAL MAMMARY REDUCTION  (BREAST);  Surgeon: Cristine Polio, MD;  Location: [place];  Service: Plastics;  Laterality: Bilateral;  ? BREAST REDUCTION SURGERY Left 09/25/2019  ? Procedure: EXCISION OF FAT NECROSIS OF LEFT BREAST;  Surgeon: Cristine Polio, MD;  Location: [place];  Service: Plastics;  Laterality: Left;  ? CARPAL TUNNEL RELEASE Right 06/17/2020  ? right thumb CMC arthroplasty with tendon transfer and right carpal tunnel release.   ? CERVICAL SPINE SURGERY  2015  ? has a metal plate  ? FOOT SURGERY Left 2014  ? HAND SURGERY    ? LEFT HEART CATH AND CORONARY ANGIOGRAPHY N/A 04/09/2022  ? Procedure: LEFT HEART CATH AND CORONARY ANGIOGRAPHY;  Surgeon: Dixie Dials, MD;  Location: [place] [facility];  Service: Cardiovascular;  Laterality: N/A;  ? REDUCTION MAMMAPLASTY Bilateral 07/2018  ? ? ?Assessment / Plan /  Recommendation ?Clinical Impression Patient is a 56 y/o female with PMH of HTN, DM, anxiety, chronic systolic CHF (EF 41-96%), PVD, atherosclerosis, tobacco abuse, chronic pain syndrome presented to [facility] on 5/1 with L facial numbness, dizziness, N/V, SOB, dysuria, HA, and abdominal pain.  In ED: BP progressed to 239/116, tachypnic and tacchycardic, UDS positive for thc, CTA chest/abd/pelvis negative, CT head negative.  Started on cardene and admitted by [facility].  MRI completed and showed acute/subacute L frontal, hippocampus, and R pons infarcts.  Suspect due to small vessel disease vs cardioembolic source. Therapy ongoing and recommendations are for CIR. Patient admitted 04/29/22. ? ?Upon arrival, patient was sitting EOB and reported feeling mildly "woozy" but declined repositioning.  Patient was administered the Cognistat and scored WFL on all subtests with the exception of mild impairments in short-term recall. Patient also required extra time to perform visual construction tasks with all replicas slightly disoriented to the left. Patient's speech intelligibility, auditory comprehension and verbal expression all appeared [facility], however, patient reports mild deficits in word-finding at the conversation level. Patient was mildly verbose during session and required extra processing time. Patient would benefit from skilled SLP intervention to maximize her cognitive functioning and overall functional independence prior to discharge.  ?  ?Skilled Therapeutic Interventions          Administered a cognitive-linguistic evaluation, please see above for details.   ?SLP Assessment ? Patient will need skilled Speech Lanaguage Pathology Services during CIR admission  ?  ?Recommendations ? Oral Care Recommendations: Oral care BID ?Recommendations for Other Services:  Neuropsych consult ?Patient destination: Home ?Follow up Recommendations:  (TBD) ?Equipment Recommended: None recommended by SLP  ?  ?SLP Frequency 3 to 5 out of 7  days   ?SLP Duration ? ?SLP Intensity ? ?SLP Treatment/Interventions 7-10 days ? ?Minumum of 1-2 x/day, 30 to 90 minutes ? ?Cognitive remediation/compensation;Internal/external aids;Therapeutic Activities;Environmental controls;Cueing hierarchy;Functional tasks;Patient/family education   ? ?Pain ?Pain Assessment ?Pain Scale: 0-10 ?Pain Score: 10-Worst pain ever ?Pain Type: Acute pain ?Pain Location: Leg ?Pain Orientation: Left ?Pain Descriptors / Indicators: Aching ?Pain Frequency: Intermittent ?Pain Onset: On-going ?Patients Stated Pain Goal: 0 ?Pain Intervention(s): Medication (See eMAR) ? ?Prior Functioning ?Type of Home: Apartment ? Lives With: Family ?Available Help at Discharge: Family;Available PRN/intermittently ? ?SLP Evaluation ?Cognition ?Overall Cognitive Status: Impaired/Different from baseline ?Arousal/Alertness: Awake/alert ?Orientation Level: Oriented X4 ?Attention: Selective;Alternating ?Selective Attention: Appears intact ?Alternating Attention: Impaired ?Alternating Attention Impairment: Functional complex ?Memory: Impaired ?Memory Impairment: Decreased short term memory;Decreased recall of new information ?Decreased Short Term Memory: Functional complex ?Awareness: Appears intact ?Problem Solving: Impaired ?Problem Solving Impairment: Functional complex ?Executive Function:  (decreased mental flexibility, overwelmed/emotional) ?Behaviors: Other (comment) ?Safety/Judgment: Appears intact  ?Comprehension ?Auditory Comprehension ?Overall Auditory Comprehension: Appears within functional limits for tasks assessed ?Expression ?Expression ?Primary Mode of Expression: Verbal ?Verbal Expression ?Overall Verbal Expression: Appears within functional limits for tasks assessed ?Written Expression ?Dominant Hand: Right ?Written Expression: Not tested ?Oral Motor ?Oral Motor/Sensory Function ?Overall Oral Motor/Sensory Function: Within functional limits ?Motor Speech ?Overall Motor Speech: Appears within  functional limits for tasks assessed ? ?Care Tool ?Care Tool Cognition ?Ability to hear (with hearing aid or hearing appliances if normally used Ability to hear (with hearing aid or hearing appliances if normally used): 0. Adequate - no difficulty in normal conservation, social interaction, listening to TV ?  ?Expression of Ideas and Wants Expression of Ideas and Wants: 4. Without difficulty (complex and basic) - expresses complex messages without difficulty and with speech that is clear and easy to understand ?  ?Understanding Verbal and Non-Verbal Content Understanding Verbal and Non-Verbal Content: 4. Understands (complex and basic) - clear comprehension without cues or repetitions  ?Memory/Recall Ability Memory/Recall Ability : Current season;Location of own room;Staff names and faces;That he or she is in a hospital/hospital unit  ? ? ?Short Term Goals: ?Week 1: SLP Short Term Goal 1 (Week 1): STGs=LTGs due to ELOS ? ?Refer to Care Plan for Long Term Goals ? ?Recommendations for other services: Neuropsych ? ?Discharge Criteria: Patient will be discharged from SLP if patient refuses treatment 3 consecutive times without medical reason, if treatment goals not met, if there is a change in medical status, if patient makes no progress towards goals or if patient is discharged from hospital. ? ?The above assessment, treatment plan, treatment alternatives and goals were discussed and mutually agreed upon: by patient ? ?Jacky Hartung ?04/30/2022, 3:11 PM ? ? ?

## 2022-04-30 NOTE — Evaluation (Signed)
Occupational Therapy Assessment and Plan  Patient Details  Name: Erin Jackson MRN: [id number] Date of Birth: 04/08/1966  OT Diagnosis: abnormal posture, acute pain, disturbance of vision, hemiplegia affecting non-dominant side, muscle weakness (generalized), pain in joint, and swelling of limb Rehab Potential:  Good ELOS: 7-10 days   Today's Date: 04/30/2022 OT Individual Time: 0950-1101 OT Individual Time Calculation (min): 71 min     Hospital Problem: Principal Problem:   Embolic stroke ([place]) Active Problems:   Primary hypertension   Stage 3b chronic kidney disease ([place])   Chronic systolic heart failure ([facility])   Past Medical History:  Past Medical History:  Diagnosis Date   Anxiety    Chronic back pain    Diabetes mellitus without complication ([place])    GERD (gastroesophageal reflux disease)    Hypertension    Past Surgical History:  Past Surgical History:  Procedure Laterality Date   ABDOMINAL HYSTERECTOMY     BREAST LUMPECTOMY Left 03/25/2022   Procedure: LEFT BREAST LUMPECTOMY;  Surgeon: Jovita Kussmaul, MD;  Location: [place];  Service: General;  Laterality: Left;   BREAST REDUCTION SURGERY Bilateral 08/15/2018   Procedure: BILATERAL MAMMARY REDUCTION  (BREAST);  Surgeon: Cristine Polio, MD;  Location: [place];  Service: Plastics;  Laterality: Bilateral;   BREAST REDUCTION SURGERY Left 09/25/2019   Procedure: EXCISION OF FAT NECROSIS OF LEFT BREAST;  Surgeon: Cristine Polio, MD;  Location: [place];  Service: Plastics;  Laterality: Left;   CARPAL TUNNEL RELEASE Right 06/17/2020   right thumb CMC arthroplasty with tendon transfer and right carpal tunnel release.    CERVICAL SPINE SURGERY  2015   has a metal plate   FOOT SURGERY Left 2014   HAND SURGERY     LEFT HEART CATH AND CORONARY ANGIOGRAPHY N/A 04/09/2022   Procedure: LEFT HEART CATH AND CORONARY ANGIOGRAPHY;  Surgeon: Dixie Dials, MD;  Location: [place] [facility];  Service: Cardiovascular;  Laterality: N/A;   REDUCTION MAMMAPLASTY Bilateral 07/2018    Assessment & Plan Clinical Impression: Patient is a 56 y.o. year old female with PMH CKD, HLD, PAD, DM2, Hear failure, chronic pain who presented to the [facility] emergency department on 04/20/2022 complaining of shortness of breath accompanied by nausea and vomiting over the past 2 weeks.  She complained of generalized weakness, dizziness, headache and dizziness.  She also stated she had some left-sided numbness to her face.  She was significantly hypertension with a systolic blood pressure in the 200s and persistent tachycardia with a rate of 130.  She became increasingly more unresponsive.  She was started on nicardipine drip and critical care medicine consulted.  CT of head with no acute abnormality.  Encephalopathy with most likely etiology due to malignant hypertension versus PRES. The following day, she endorsed right facial numbness and left fifth digit of the hand and foot numbness.  Hypodensity on CT of the right pons noted and patient was started on antiplatelet therapy with Plavix.  EEG negative for seizures.  MRI consistent with right pontine infarct.  Patient transferred to CIR on 04/29/2022 .    Patient currently requires min with basic self-care skills secondary to muscle weakness and muscle joint tightness, decreased cardiorespiratoy endurance, abnormal tone, unbalanced muscle activation, and decreased coordination, decreased visual motor skills and diplopia, decreased gaze stabilization, decreased attention, decreased safety awareness, decreased memory, and delayed processing, and central origin.  Prior to hospitalization, patient could complete ADL/IADL with independent .  Patient will benefit  from skilled intervention to increase independence with basic self-care skills and increase level of independence with iADL prior to discharge home with care partner.  Anticipate patient will require  intermittent supervision and follow up outpatient.  OT - End of Session Activity Tolerance: Decreased this session Endurance Deficit: Yes Endurance Deficit Description: Patient needs frequent rest breaks during am bathing and dressing session OT Assessment OT Barriers to Discharge: Decreased caregiver support OT Barriers to Discharge Comments: Patient reports son is not available to offer much assistance.  Patient limited by pain OT Patient demonstrates impairments in the following area(s): Balance;Behavior;Cognition;Edema;Endurance;Motor;Pain;Vision;Sensory;Safety OT Basic ADL's Functional Problem(s): Eating;Grooming;Bathing;Dressing;Toileting OT Advanced ADL's Functional Problem(s): Simple Meal Preparation;Light Housekeeping OT Transfers Functional Problem(s): Toilet;Tub/Shower OT Additional Impairment(s): Fuctional Use of Upper Extremity OT Plan OT Intensity: Minimum of 1-2 x/day, 45 to 90 minutes OT Frequency: 5 out of 7 days OT Duration/Estimated Length of Stay: 7-10 days OT Treatment/Interventions: Balance/vestibular training;Discharge planning;Pain management;Functional electrical stimulation;Self Care/advanced ADL retraining;Therapeutic Activities;UE/LE Coordination activities;Cognitive remediation/compensation;Functional mobility training;Patient/family education;Therapeutic Exercise;Visual/perceptual remediation/compensation;Community reintegration;DME/adaptive equipment instruction;Neuromuscular re-education;Psychosocial support;Splinting/orthotics;UE/LE Strength taining/ROM OT Self Feeding Anticipated Outcome(s): Independent OT Basic Self-Care Anticipated Outcome(s): Modified independent OT Toileting Anticipated Outcome(s): Modified independent OT Bathroom Transfers Anticipated Outcome(s): Modified independent OT Recommendation Recommendations for Other Services: Neuropsych consult Patient destination: Home Follow Up Recommendations: Home health OT;Outpatient OT (would prefer  OP) Equipment Recommended: Tub/shower seat   OT Evaluation Precautions/Restrictions  Precautions Precautions: Fall Precaution Comments: per Jenetta Loges, PA, no limitations to L shoulder s/p rotator cuff repair in March Restrictions Weight Bearing Restrictions: No General Chart Reviewed: Yes Additional Pertinent History: PMH CKD, HLD, PAD, DM2, Heart failure, chronic pain, L Rotator Cuff Repair 03/23 Family/Caregiver Present: No Vital Signs Therapy Vitals Pulse Rate: (!) 107 Resp: 18 BP: 106/71 Patient Position (if appropriate): Lying Oxygen Therapy SpO2: 98 % O2 Device: Room Air Pain Pain Assessment Pain Scale: 0-10 Pain Score: 10-Worst pain ever Pain Type: Acute pain Pain Location: Leg Pain Orientation: Left Pain Descriptors / Indicators: Aching Pain Frequency: Intermittent Pain Onset: On-going Patients Stated Pain Goal: 0 Pain Intervention(s): Medication (See eMAR) Home Living/Prior Functioning Home Living Family/patient expects to be discharged to:: Private residence Living Arrangements: Children Available Help at Discharge: Family, Available PRN/intermittently Type of Home: Apartment Home Access: Stairs to enter Technical brewer of Steps: 3 Entrance Stairs-Rails: None Home Layout: One level Bathroom Shower/Tub: Chiropodist: Standard  Lives With: Family IADL History Homemaking Responsibilities: Yes Meal Prep Responsibility: Therapist, occupational Responsibility: Primary Cleaning Responsibility: Primary Current License: Yes Mode of Transportation: Musician Occupation: Retired Prior Function Level of Independence: Independent with basic ADLs, Independent with homemaking with ambulation Vision Baseline Vision/History: 1 Wears glasses (for reading) Ability to See in Adequate Light: 1 Impaired Patient Visual Report: Diplopia;Eye fatigue/eye pain/headache;Nausea/blurring vision with head movement;Peripheral vision impairment Vision  Assessment?: Yes Eye Alignment: Impaired (comment) Ocular Range of Motion: Restricted looking up Alignment/Gaze Preference: Within Defined Limits Tracking/Visual Pursuits: Decreased smoothness of vertical tracking Saccades: Decreased speed of saccadic movement;Impaired - to be further tested in functional context Convergence: Impaired (comment) Visual Fields: Left visual field deficit Diplopia Assessment: Disappears with one eye closed Additional Comments: diplopia, light sensitivity, decreased gaze stabilization, movement intolerance Perception  Perception: Within Functional Limits Praxis Praxis: Intact Cognition Cognition Overall Cognitive Status: Impaired/Different from baseline Arousal/Alertness: Awake/alert Orientation Level: Person;Place;Situation Person: Oriented Place: Oriented Situation: Oriented Memory: Impaired Memory Impairment: Decreased short term memory;Decreased recall of new information Decreased Short Term Memory: Functional complex Attention: Selective;Alternating Selective Attention: Appears intact Alternating Attention  Impairment: Functional complex Awareness: Appears intact Problem Solving: Impaired Problem Solving Impairment: Functional complex Executive Function:  (decreased mental flexibility, overwelmed/emotional) Behaviors: Other (comment) Safety/Judgment: Appears intact Sensation Sensation Light Touch: Impaired by gross assessment Hot/Cold: Not tested Proprioception: Impaired by gross assessment Stereognosis: Impaired by gross assessment Coordination Gross Motor Movements are Fluid and Coordinated: No Fine Motor Movements are Fluid and Coordinated: No Finger Nose Finger Test: significant dysmetria LUE, Decreased gaze stabilization impacts eye hand coord Motor  Motor Motor: Hemiplegia;Abnormal postural alignment and control;Abnormal tone Motor - Skilled Clinical Observations: Flexed posture - can correct with  cueing  Trunk/Postural  Assessment  Thoracic Assessment Thoracic Assessment: Exceptions to [facility] Thoracic AROM Overall Thoracic AROM: Due to premorid status Overall Thoracic AROM Comments: Limited sustained thoracic extension, stays in flexion Lumbar Assessment Lumbar Assessment: Exceptions to [facility] Lumbar AROM Overall Lumbar AROM: Due to premorid status Overall Lumbar AROM Comments: Limited sustained anterior pelvic tilt, stays posterior Postural Control Postural Control: Deficits on evaluation Righting Reactions: delayed Protective Responses: delayed  Balance Balance Balance Assessed: Yes Dynamic Sitting Balance Dynamic Sitting - Balance Support: No upper extremity supported Dynamic Sitting - Level of Assistance: 5: Stand by assistance Dynamic Sitting - Balance Activities: Lateral lean/weight shifting;Forward lean/weight shifting Sitting balance - Comments: reaching forward outside BOS Static Standing Balance Static Standing - Balance Support: Right upper extremity supported;Bilateral upper extremity supported;During functional activity Static Standing - Level of Assistance: 4: Min assist Dynamic Standing Balance Dynamic Standing - Balance Support: Right upper extremity supported;Left upper extremity supported;Bilateral upper extremity supported;During functional activity Dynamic Standing - Level of Assistance: 4: Min assist Dynamic Standing - Balance Activities: Lateral lean/weight shifting;Forward lean/weight shifting Extremity/Trunk Assessment RUE Assessment RUE Assessment: Within Functional Limits (H/O RCR but at baseline) General Strength Comments: WFL for basic ADL LUE Assessment LUE Assessment: Exceptions to [facility] Passive Range of Motion (PROM) Comments: Pain in left shoulder and arm with passive motion, worse with active motion - has PROM shoulder tp 110 degrees - beyond - range ois available but painful - recent RCR march 2023 Active Range of Motion (AROM) Comments: In supine less than 10 degrees  active motion, in function has at least 45 degrees of forward reach LUE Body System: Neuro;Ortho Brunstrum levels for arm and hand: Arm;Hand Brunstrum level for arm: Stage IV Movement is deviating from synergy Brunstrum level for hand: Stage IV Movements deviating from synergies LUE AROM (degrees) Overall AROM Left Upper Extremity: Deficits LUE Overall AROM Comments: Painful shoulder flexi/ext especially initiating against gravity, Painful elbow flex/ext - does beter with cues to reduce tension Left Shoulder Flexion: 45 Degrees Left Shoulder ABduction: 30 Degrees LUE PROM (degrees) Overall PROM Left Upper Extremity: Deficits LUE Overall PROM Comments: Recent RCR LUE Strength LUE Overall Strength Comments: NT due to pain and stiffness  Care Tool Care Tool Self Care Eating        Oral Care         Bathing              Upper Body Dressing(including orthotics)            Lower Body Dressing (excluding footwear)          Putting on/Taking off footwear             Care Tool Toileting Toileting activity         Care Tool Bed Mobility Roll left and right activity   Roll left and right assist level: Supervision/Verbal cueing    Sit to lying activity  Sit to lying assist level: Supervision/Verbal cueing    Lying to sitting on side of bed activity   Lying to sitting on side of bed assist level: the ability to move from lying on the back to sitting on the side of the bed with no back support.: Supervision/Verbal cueing     Care Tool Transfers Sit to stand transfer   Sit to stand assist level: Contact Guard/Touching assist    Chair/bed transfer   Chair/bed transfer assist level: Minimal Assistance - Patient > 75%     Toilet transfer   Assist Level: Minimal Assistance - Patient > 75%     Care Tool Cognition  Expression of Ideas and Wants Expression of Ideas and Wants: 4. Without difficulty (complex and basic) - expresses complex messages without difficulty  and with speech that is clear and easy to understand  Understanding Verbal and Non-Verbal Content Understanding Verbal and Non-Verbal Content: 4. Understands (complex and basic) - clear comprehension without cues or repetitions   Memory/Recall Ability Memory/Recall Ability : Current season;Location of own room;Staff names and faces;That he or she is in a hospital/hospital unit   Refer to Care Plan for [place] 1 OT Short Term Goal 1 (Week 1): Patient will ambulate to bathroom with supervision using RW OT Short Term Goal 2 (Week 1): Patient wil demonstrate at least 60 degrees of left shoulder flexion (low to mid level reach) without increase in pain in left arm OT Short Term Goal 3 (Week 1): Patient will dress her upper body with no more than intermittent min assistance and increased time OT Short Term Goal 4 (Week 1): Patient will dress her lower body with no more than intermittent min assistance and increased time OT Short Term Goal 5 (Week 1): Patient will complete tub/shower transfer using tub/transfer bench and supervision/cueing  Recommendations for other services: Neuropsych   Skilled Therapeutic Intervention ADL ADL Eating: Minimal assistance Where Assessed-Eating: Chair Grooming: Minimal cueing Where Assessed-Grooming: Sitting at sink Upper Body Bathing: Minimal assistance Where Assessed-Upper Body Bathing: Shower Lower Body Bathing: Minimal assistance Where Assessed-Lower Body Bathing: Shower Upper Body Dressing: Minimal assistance (gown only) Where Assessed-Upper Body Dressing: Chair Lower Body Dressing: Minimal assistance Where Assessed-Lower Body Dressing: Chair Toileting: Minimal assistance Where Assessed-Toileting: Glass blower/designer: Psychiatric nurse Method: Magazine features editor: Environmental education officer Method: Heritage manager: Technical sales engineer Sit to Stand: Minimal Assistance - Patient > 75%   Discharge Criteria: Patient will be discharged from OT if patient refuses treatment 3 consecutive times without medical reason, if treatment goals not met, if there is a change in medical status, if patient makes no progress towards goals or if patient is discharged from hospital.  The above assessment, treatment plan, treatment alternatives and goals were discussed and mutually agreed upon: by patient  Mariah Milling 04/30/2022, 2:35 PM

## 2022-04-30 NOTE — Plan of Care (Signed)
?  Problem: RH Balance ?Goal: LTG: Patient will maintain dynamic sitting balance (OT) ?Description: LTG:  Patient will maintain dynamic sitting balance with assistance during activities of daily living (OT) ?Flowsheets (Taken 04/30/2022 1442) ?LTG: Pt will maintain dynamic sitting balance during ADLs with: Independent ?  ?

## 2022-05-01 LAB — GLUCOSE, CAPILLARY
Glucose-Capillary: 177 mg/dL — ABNORMAL HIGH (ref 70–99)
Glucose-Capillary: 269 mg/dL — ABNORMAL HIGH (ref 70–99)
Glucose-Capillary: 150 mg/dL — ABNORMAL HIGH (ref 70–99)
Glucose-Capillary: 157 mg/dL — ABNORMAL HIGH (ref 70–99)
Glucose-Capillary: 335 mg/dL — ABNORMAL HIGH (ref 70–99)

## 2022-05-01 LAB — HEMOGLOBIN AND HEMATOCRIT, BLOOD
HCT: 28.5 % — ABNORMAL LOW (ref 36.0–46.0)
Hemoglobin: 9.1 g/dL — ABNORMAL LOW (ref 12.0–15.0)

## 2022-05-01 LAB — VITAMIN D 25 HYDROXY (VIT D DEFICIENCY, FRACTURES): Vit D, 25-Hydroxy: 28.75 ng/mL — ABNORMAL LOW (ref 30–100)

## 2022-05-01 MED ORDER — DAPAGLIFLOZIN PROPANEDIOL 10 MG PO TABS
10.0000 mg | ORAL_TABLET | Freq: Every day | ORAL | Status: DC
  Administered 2022-05-02 – 2022-05-08 (×7): 10 mg via ORAL
  Filled 2022-05-01 (×7): qty 1

## 2022-05-01 MED ORDER — VITAMIN D (ERGOCALCIFEROL) 1.25 MG (50000 UNIT) PO CAPS
50000.0000 [IU] | ORAL_CAPSULE | ORAL | Status: DC
Start: 2022-05-01 — End: 2022-05-08
  Administered 2022-05-01: 50000 [IU] via ORAL
  Filled 2022-05-01: qty 1

## 2022-05-01 NOTE — Progress Notes (Signed)
Patient is completely noncompliant with her diet and is order excessive sugary products such as sherbert and fruit cups/ carb heavy meals. Patient also has ordered two trays for lunch and two trays for dinner. Pt educated on the importance of maintaining a low carb diet to maintain blood sugar levels. Patient unreceptive to teaching.  ? ? ?Rito Ehrlich, LPN  ?

## 2022-05-01 NOTE — Progress Notes (Signed)
Patient was instructed that with a less restrictive diet, then they would do their own due diligence and order things within the parameters of their restrictions. Patient has now been switched back to a Carb Modified diet and has stated that she "will only drink water since she's such a pain to deal with" and stated that she was going on a hunger strike. Patient is also ignoring safety plan and got out of bed by herself with the rolling walker and made it out her door and into the hallway before being stopped by staff stating that "she is going up to talk to [place] because something ain't right." Bed alarm set, bed in lowest position, patient reeducation provided. Will continue to monitor. Dr. Ranell Patrick and Katharine Look PA notified of situation.  ? ? ?Gladstone Lighter, LPN  ?

## 2022-05-01 NOTE — Progress Notes (Signed)
Patient ID: Erin Jackson, female   DOB: July 06, 1966, 56 y.o.   MRN: [id number]  Met with pt per her request to discuss her concerns and plans. Aware her son [name] has not returned worker's call. Discussed she is doing well and making progress and looks like goals are supervision-mod/I level. She fears being alone due to this happening to her and feels her son whom lives with her will not assist. Discussed another option of possibly going to [place] where [place] lives and her other family members are there. She will talk with him over the weekend about this. Continue to work on discharge needs. She is aware of her 7-10 days here-ELOS ?

## 2022-05-01 NOTE — Progress Notes (Signed)
?                                                       PROGRESS NOTE ? ? ?Subjective/Complaints: ?No new complaints this morning ?Needs to ambulate to bathroom ?Discussed grounds pass for Mother's Day weekend and she is excited about this ? ?ROS: +LLE pain and numbness ? ?Objective: ?  ?No results found. ?Recent Labs  ?  04/30/22 ?[id number] 05/01/22 ?[id number]  ?WBC 8.6  --   ?HGB 9.0* 9.1*  ?HCT 27.9* 28.5*  ?PLT 308  --   ? ?Recent Labs  ?  04/30/22 ?[id number]  ?NA 138  ?K 4.2  ?CL 106  ?CO2 24  ?GLUCOSE 121*  ?BUN 15  ?CREATININE 1.41*  ?CALCIUM 8.4*  ? ? ?Intake/Output Summary (Last 24 hours) at 05/01/2022 1149 ?Last data filed at 05/01/2022 0700 ?Gross per 24 hour  ?Intake 556 ml  ?Output --  ?Net 556 ml  ?  ? ?  ? ?Physical Exam: ?Vital Signs ?Blood pressure 138/74, pulse 91, temperature 97.8 ?F (36.6 ?C), temperature source Oral, resp. rate 14, weight 91.2 kg, SpO2 99 %. ?Gen: no distress, normal appearing, BMI 36.77 ?HEENT: oral mucosa pink and moist, NCAT ?Cardio: Reg rate ?Chest: normal effort, normal rate of breathing ?Abd: soft, non-distended ?Ext: no edema ?Psych: pleasant, normal affect ?Skin: intact ?Neuro:  alert and oriented, No aphasia. Fluent language, able to name objects. Delayed responses. She is following one step commands ?CN 2-12 intact other than decreased facial sensation on the left ?Finger to nose she was slow to initiate but overall normal ?Sensation decreased in L 2nd to 5th toes ?Musculoskeletal: ?IV in both arms ?Tone normal ?No joint swelling noted ?5/5 in RUE and RLE ?4-4+/5 in LLE ?Finger flexion LUE at least 4/5 ?No shoulder abduction noted appears not be limited by pain or effort ?Able to hold elbow flexed against gravity, Minimal active elbow extension noted, Appears to be able to hold arm extended when sitting, limited tested appears to be due to limited effort or pain with exam. Tenderness throughout proximal LUE and pain with passive ROM at elbow and shoulder ?  ? ? ? ?Assessment/Plan: ?1.  Functional deficits which require 3+ hours per day of interdisciplinary therapy in a comprehensive inpatient rehab setting. ?Physiatrist is providing close team supervision and 24 hour management of active medical problems listed below. ?Physiatrist and rehab team continue to assess barriers to discharge/monitor patient progress toward functional and medical goals ? ?Care Tool: ? ?Bathing ?   ?   ?   ?  ?  ?Bathing assist Assist Level: Minimal Assistance - Patient > 75% ?  ?  ?Upper Body Dressing/Undressing ?Upper body dressing   ?  ?   ?Upper body assist Assist Level: Minimal Assistance - Patient > 75% ?   ?Lower Body Dressing/Undressing ?Lower body dressing ? ? ?   ?  ? ?  ? ?Lower body assist Assist for lower body dressing: Minimal Assistance - Patient > 75% ?   ? ?Toileting ?Toileting    ?Toileting assist Assist for toileting: Minimal Assistance - Patient > 75% ?Assistive Device Comment: walker ?  ?Transfers ?Chair/bed transfer ? ?Transfers assist ?   ? ?Chair/bed transfer assist level: Minimal Assistance - Patient > 75% ?  ?  ?Locomotion ?Ambulation ? ? ?Ambulation assist ? ?   ? ?  Assist level: Minimal Assistance - Patient > 75% ?Assistive device: No Device ?Max distance: 36'  ? ?Walk 10 feet activity ? ? ?Assist ?   ? ?Assist level: Minimal Assistance - Patient > 75% ?Assistive device: No Device  ? ?Walk 50 feet activity ? ? ?Assist Walk 50 feet with 2 turns activity did not occur: Safety/medical concerns (decreased endurnace without device) ? ?  ?   ? ? ?Walk 150 feet activity ? ? ?Assist Walk 150 feet activity did not occur: Safety/medical concerns ? ?  ?  ?  ? ?Walk 10 feet on uneven surface  ?activity ? ? ?Assist   ? ? ?Assist level: Minimal Assistance - Patient > 75% ?   ? ?Wheelchair ? ? ? ? ?Assist Is the patient using a wheelchair?: Yes (for energy conservation/time management but will not use at d/c anticipated) ?Type of Wheelchair: Manual ?  ? ?Wheelchair assist level: Dependent - Patient 0% ?    ? ? ?Wheelchair 50 feet with 2 turns activity ? ? ? ?Assist ? ?  ?  ? ? ?Assist Level: Dependent - Patient 0%  ? ?Wheelchair 150 feet activity  ? ? ? ?Assist ?   ? ? ?Assist Level: Dependent - Patient 0%  ? ?Blood pressure 138/74, pulse 91, temperature 97.8 ?F (36.6 ?C), temperature source Oral, resp. rate 14, weight 91.2 kg, SpO2 99 %. ? ?Medical Problem List and Plan: ?1. Functional deficits secondary to right pontine infarct and punctate left hippocampus and frontal white matter infarcts. Neurology suspects cardioembolic vs small vessel disease source. 30 Day cardionet monitoring to rule out afib ?            -patient may shower ?            -ELOS/Goals: 12-14 days Sup to Mod I with PT/OT/SLP ? Continue CIR ?2.  Antithrombotics: ?-DVT/anticoagulation:  Pharmaceutical: Heparin ?            -antiplatelet therapy: Plavix. Allergy to aspirin ?3. Pain Management: Tylenol as needed ?-Chronic pain syndrome on Xtampza not restarted on admission ?-continue Lyrica 50 mg daily ?-restarted Norco 5mg  TID prn ?4. Anxiety: LCSW to evaluate and provide emotional support ?            -antipsychotic agents: n/a ?            -anxiety: continue home Xanax 2mg  BID as needed.   ?            -patient reported history of bipolar disorder: continue trazodone 150 mg q HS.  She is agreeable to neuropsychiatric evaluation ?5. Neuropsych: This patient is capable of making decisions on her own behalf. ?            --patient reported history of bipolar disorder: q HS.  She is agreeable to neuropsychiatric evaluation ?6. Skin/Wound Care: Routine skin care checks ?7. Fluids/Electrolytes/Nutrition: Routine I's and O's and follow-up chemistries ?8: Hypertension: monitor TID/prn ?           --continue Lopressor 50 mg BID ?          -BP overall has been well controlled ?9: Chronic systolic heart failure: no diuretics ?           -Continue metoprolol/losartan/farxiga, consider Entresto if BP tolerates ?10: DM-2: monitor CBGs, carb modified diet ?             --continue Farxiga 5 mg daily ?            --continue metformin 500  mg BID ?            --continue SSI 0-15u ?            -CBGs in 200s-260s, consider increase metformin if remains elevated, discussed diet ?11: CKD 3b: Elevated BUN and creatinine.  Encourage p.o. fluids and follow-up BMP. ?12: CAD: left heart cath on 04/09/2022 Dr. Doylene Canard; no significant stenosis. Discussed healthy diet ?13: Hyperlipidemia: continue Lipitor 80 mg daily, Zetia 10 mg daily ?14: PAD: seen in hospital consultation by Dr. Stanford Breed 04/09/2022: follow-up at discharge for abnormal left ABI ?            --continue Plavix and statin ?15: Tobacco use: Recently stopped and is not having cravings ?16: Obesity: dietary counseling ?17: Left breat mass: s/p lumpectomy 03/25/2022>>non-malignant.  Incision healing. ?18: Food allergies: citrus/cucumber/apple juice/tree nuts,peanuts/pickles ?19: Lactose intolerant: avoid dairy ?20: GERD: Continue Protonix ?21. Vitamin D deficiency: start ergocalciferol 50,000U once per week for 7 weeks. ?22. Anemia: Hgb stable, monitor weekly ?23. Insomnia: continue trazodone 50mg  HS ? ?LOS: ?2 days ?A FACE TO FACE EVALUATION WAS PERFORMED ? ?Martha Clan P Tamanika Heiney ?05/01/2022, 11:49 AM  ? ?  ?

## 2022-05-01 NOTE — Progress Notes (Signed)
Speech Language Pathology Daily Session Note ? ?Patient Details  ?Name: Erin Jackson ?MRN: [id number] ?Date of Birth: 1966-09-16 ? ?Today's Date: 05/01/2022 ?SLP Individual Time: 8527-7824 ?SLP Individual Time Calculation (min): 54 min and Today's Date: 05/01/2022 ?SLP Missed Time: 6 Minutes ?Missed Time Reason: Other (Comment) (Pt took urgent phone call from son; requested for SLP to exit room.) ? ?Short Term Goals: ?Week 1: SLP Short Term Goal 1 (Week 1): STGs=LTGs due to ELOS ? ?Skilled Therapeutic Interventions: Skilled ST treatment focused on cognitive goals. SLP facilitated session by providing eduction on compensatory memory strategies. At prior level pt reports using pen and paper and occasionally uses her phone to take notes, set reminders, etc. Pt reports a mild change to her short-term memory since her CVA and referenced this throughout session as she tried to recall previous discussions and people she spoke with yesterday. SLP provided pt with pen/paper and post-it notes to facilitate use of memory compensations throughout the day. Pt placed these items in her stroke education binder. During conversation regarding her stroke, pt demonstrated signs of fear and anxiety regarding her new dx. Evaluating SLP recommended Neuropsych consult at eval; continue to support this referral. During session pt received an urgent call from her son that she needed to take and requested SLP step out of room. Pt missed 6 minutes of skilled SLP intervention to allow space for this phone call per pt request. Pt apologetic. SLP initiated education on strategies for living a brain healthy lifestyle and making informed decisions to improve overall health and wellness using DANCERS acronym (i.e., disease management, activity, nutrition, cognitive stimulation, engagement, relaxation, sleep). Only able to discuss D - disease management due to time constraints. Pt was also mildly verbose toward end of session and required occasional  verbal redirection to task. Patient was left in bed with alarm activated and immediate needs within reach at end of session. Continue per current plan of care.   ?   ?Pain ? None/denied ? ?Therapy/Group: Individual Therapy ? ?Ojani Berenson T [name] ?05/01/2022, 8:34 AM ?

## 2022-05-01 NOTE — Progress Notes (Signed)
Physical Therapy Session Note ? ?Patient Details  ?Name: Erin Jackson ?MRN: [id number] ?Date of Birth: 05/24/1966 ? ?Today's Date: 05/01/2022 ?PT Individual Time: 4970-2637 ?PT Individual Time Calculation (min): 73 min  ? ?Short Term Goals: ?Week 1:  PT Short Term Goal 1 (Week 1): = LTGs ? ?Skilled Therapeutic Interventions/Progress Updates:  ?   ?Pt presenting sitting EOB to start - agreeable to PT tx. Denies pain. Pt surprised of therapy session and required education on therapy schedules. ? ? Pt requesting to use bathroom prior to leaving the room. She reports she will require 10 minutes for "me time." Sit<>stand to RW with CGA - L inattention present and required cueing for grasping RUE with her LUE. Pt also attempting to hold her cell phone with her RUE while also grasping the RW - ed on safety awareness. Ambulated to the bathroom with CGA and RW - speed slowed but no buckling or LOB present. Pt requesting a 2nd gown to cover her backside - retrieved from utility while pt voided. She was continent of bladder - she was video calling a family/friend while on the toilet - required gentle redirection to complete therapy session. Pt able to complete toileting tasks without assistance. Ambulated to the sink with CGA and RW where she completed hand hygiene without assist. Transported in w/c to day room rehab gym for time management. Stand<>pivot transfer with CGA and no AD to mat table. Completed BERG balance test as outlined below.  ? ?Patient demonstrates increased fall risk as noted by score of 32/56 on Berg Balance Scale.  (<36= high risk for falls, close to 100%; 37-45 significant >80%; 46-51 moderate >50%; 52-55 lower >25%) ? ?TUG completed with RW and CGA/close supervision. Score impacted by self limiting behaviors and distractions from gym environment. ?1) 109 seconds ?*Scores > 13.5 seconds indciate increase falls risk.  ? ?Returned to her room and pt requesting to end session seated EOB. Stand>pivot transfer  with close supervision and bed rail assist. Remained seated EOB with tray table in front, bed alarm on, call bell in lap. All personal items within reach.  ? ?Therapy Documentation ?Precautions:  ?Precautions ?Precautions: Fall ?Precaution Comments: per Ralene Bathe, PA, no limitations to L shoulder s/p rotator cuff repair in March ?Restrictions ?Weight Bearing Restrictions: No ?General: ?  ? ?Therapy/Group: Individual Therapy ? ?Erin Jackson ?05/01/2022, 7:35 AM  ?

## 2022-05-01 NOTE — Plan of Care (Signed)
?  Problem: RH PAIN MANAGEMENT ?Goal: RH STG PAIN MANAGED AT OR BELOW PT'S PAIN GOAL ?Description: At or below level 4 with prns ?Outcome: Progressing ?  ?

## 2022-05-01 NOTE — Progress Notes (Signed)
Occupational Therapy Session Note ? ?Patient Details  ?Name: Erin Jackson ?MRN: [id number] ?Date of Birth: 06/22/1966 ? ?Today's Date: 05/01/2022 ?OT Individual Time: 1100-1200 ?OT Individual Time Calculation (min): 60 min  ? ? ?Short Term Goals: ?Week 1:  OT Short Term Goal 1 (Week 1): Patient will ambulate to bathroom with supervision using RW ?OT Short Term Goal 2 (Week 1): Patient wil demonstrate at least 60 degrees of left shoulder flexion (low to mid level reach) without increase in pain in left arm ?OT Short Term Goal 3 (Week 1): Patient will dress her upper body with no more than intermittent min assistance and increased time ?OT Short Term Goal 4 (Week 1): Patient will dress her lower body with no more than intermittent min assistance and increased time ?OT Short Term Goal 5 (Week 1): Patient will complete tub/shower transfer using tub/transfer bench and supervision/cueing ? ?Skilled Therapeutic Interventions/Progress Updates:  ?  Subjective: Pt agreeable to changing into scrub pants and shirt, reports sharp pain in left shoulder with AROM. ?  Objective:  Pt sitting EOB, donned shirt with education on hemi strategy with min assist and donned pants with min assist at sit<>stand level. Pt completed sit to supine with CGA and PROM to left shoulder in all planes with increased time to facilitate decrease guarding and improve ROM.  Pt achieved ~110 degrees flexion and abduction.  Full ER and IR.  Pt then instructed in slow and gentle chest press AAROM using weightless dowel.2x 8 reps.  Pt reports tolerable pain level and politely declining cold pack.  Call bell in reach, seat alarm on.   ?  Assessment:  Pt making progress evidenced by initiation of AAROM .  Primary barriers today included pain and guarding of left shoulder. ?  Plan: Pt would benefit from further training on progressing AAROM left shoulder.  ? ?Therapy Documentation ?Precautions:  ?Precautions ?Precautions: Fall ?Precaution Comments: per Ralene Bathe, PA, no limitations to L shoulder s/p rotator cuff repair in March ?Restrictions ?Weight Bearing Restrictions: No ? ? ? ?Therapy/Group: Individual Therapy ? ?Erin Jackson ?05/01/2022, 1:49 PM ?

## 2022-05-01 NOTE — Progress Notes (Signed)
Ms. Shafer was not able to eat the food she was brought today for lunch. The portions are too small and she is not allowed to have salt on HH diet. Diet orders were changed from CM/HH to regular then back to CM/HH this morning. I discussed again the need to monitor carbohydrate intake and she understands and is primarily dissatisfied with the lack of seasoning.  I changed her to diet to regular and implored her to make healthy choices. She states her blood sugars have gone from high to low ever since being treated for DM. She cannot tolerated milk for drinking, but tolerates dairy/milk-containing products. Discussed with nursing staff. ?

## 2022-05-02 LAB — GLUCOSE, CAPILLARY
Glucose-Capillary: 248 mg/dL — ABNORMAL HIGH (ref 70–99)
Glucose-Capillary: 141 mg/dL — ABNORMAL HIGH (ref 70–99)
Glucose-Capillary: 147 mg/dL — ABNORMAL HIGH (ref 70–99)
Glucose-Capillary: 207 mg/dL — ABNORMAL HIGH (ref 70–99)

## 2022-05-02 MED ORDER — METFORMIN HCL ER 500 MG PO TB24
500.0000 mg | ORAL_TABLET | Freq: Two times a day (BID) | ORAL | Status: DC
  Administered 2022-05-02 – 2022-05-04 (×6): 500 mg via ORAL
  Filled 2022-05-02 (×7): qty 1

## 2022-05-02 NOTE — Progress Notes (Incomplete)
Occupational Therapy Session Note ? ?Patient Details  ?Name: Erin Jackson ?MRN: [id number] ?Date of Birth: 03-26-1966 ? ?Today's Date: 05/02/2022 ?OT Individual Time: 1430-1500 ?OT Individual Time Calculation (min): 30 min  ? ? ?Short Term Goals: ?Week 1:  OT Short Term Goal 1 (Week 1): Patient will ambulate to bathroom with supervision using RW ?OT Short Term Goal 2 (Week 1): Patient wil demonstrate at least 60 degrees of left shoulder flexion (low to mid level reach) without increase in pain in left arm ?OT Short Term Goal 3 (Week 1): Patient will dress her upper body with no more than intermittent min assistance and increased time ?OT Short Term Goal 4 (Week 1): Patient will dress her lower body with no more than intermittent min assistance and increased time ?OT Short Term Goal 5 (Week 1): Patient will complete tub/shower transfer using tub/transfer bench and supervision/cueing ? ?Skilled Therapeutic Interventions/Progress Updates:  ?  Pt received in the bed and was able to come to EOB with supervision. Dicussion about changes since stroke and pt reports weakness in left UE/LE. Pt also reports pain in upper UE and presents with difficulty with moving it upon asking her to move it however functionally in a task performs more successfully (ie shoulder and elbow flexion/ extension) ?Pt ambulated to the laundry room with RW at a very slow pace with difficulty increasing her speed. Pt reports burning sensation in left LE. Assisted pt with putting laundry in washer while pt rested. Returned to room via w/c. ? ?Left pt sitting in the bed with safety measures in place.  ? ?Therapy Documentation ?Precautions:  ?Precautions ?Precautions: Fall ?Precaution Comments: per Jenetta Loges, PA, no limitations to L shoulder s/p rotator cuff repair in March ?Restrictions ?Weight Bearing Restrictions: No ? ?Pain: ?Pain reported in left Ue from previous sx ? ? ?Therapy/Group: Individual Therapy ? ?Nicoletta Ba ?05/02/2022,  7:33 PM ?

## 2022-05-02 NOTE — Progress Notes (Signed)
Physical Therapy Session Note ? ?Patient Details  ?Name: Erin Jackson ?MRN: [id number] ?Date of Birth: 1966/11/08 ? ?Today's Date: 05/02/2022 ?PT Individual Time: 6283-6629 ?PT Individual Time Calculation (min): 57 min  ? ?Short Term Goals: ?Week 1:  PT Short Term Goal 1 (Week 1): = LTGs ? ?Skilled Therapeutic Interventions/Progress Updates:  ? ?Pt received sitting EOB and agreeable to PT. Pt requesting gown for back side, rather than donning pants. Uppeor body dressing sitting EOB with mod assist and poor ROM on the LUE. Stand pivot transfer to [facility] with RW and supervision assist. All sit<>stand performed with CGA-supervision assist with increased time and cues for proper UE placement to push from [facility].  ? ?Pt transported to rehab gym in [facility]. Gait training x 44f +384fwith min assist for AD direction and cues to keep eyes open. Pt noted to have imrpvoed step length and cadence with eyes open vs eyes closed.  ? ?Pt performed standing balance on airex pad to complete low and medium difficulty foam block puzzle with cues for visual scanning to the R proper stance width to reduce fall risk. One mild posterior LOB with min assist from PT. Noted to have intermittent 1 UE support on RW throughout from LUE.  ? ?[facility] reach and all needs met.   ? ? ?   ? ?Therapy Documentation ?Precautions:  ?Precautions ?Precautions: Fall ?Precaution Comments: per TrJenetta LogesPA, no limitations to L shoulder s/p rotator cuff repair in March ?Restrictions ?Weight Bearing Restrictions: No ? ?Pain: ?Pain Assessment ?Pain Scale: 0-10 ?Pain Score: 5  ?Pain Location: Leg ?Pain Orientation: Left ?Pain Descriptors / Indicators: Aching ?Pain Onset: On-going ?Patients Stated Pain Goal: 0 ?Pain Intervention(s): Medication (See eMAR);Ambulation/increased activity ? ? ?Therapy/Group: Individual Therapy ? ?AuLorie Phenix5/13/2023, 9:03 AM  ?

## 2022-05-02 NOTE — IPOC Note (Signed)
Overall Plan of Care (IPOC) ?Patient Details ?Name: Erin Jackson ?MRN: [id number] ?DOB: [birth year] ? ?Admitting Diagnosis: Embolic stroke ([place]) ? ?Hospital Problems: Principal Problem: ?  Embolic stroke ([place]) ?Active Problems: ?  Primary hypertension ?  Stage 3b chronic kidney disease ([place]) ?  Chronic systolic heart failure ([place]) ? ? ? ? Functional Problem List: ?Nursing Bowel, Endurance, Pain, Medication Management, Safety  ?PT Balance, Endurance, Motor, Sensory, Pain  ?OT Balance, Behavior, Cognition, Edema, Endurance, Motor, Pain, Vision, Sensory, Safety  ?SLP Cognition  ?TR    ?    ? Basic ADL?s: ?OT Eating, Grooming, Bathing, Dressing, Toileting  ? ?  Advanced  ADL?s: ?OT Simple Meal Preparation, Light Housekeeping  ?   ?Transfers: ?PT Bed Mobility, Bed to Chair, Car, Furniture, Floor  ?OT Toilet, Tub/Shower  ? ?  Locomotion: ?PT Ambulation, Stairs  ? ?  Additional Impairments: ?OT Fuctional Use of Upper Extremity  ?SLP Social Cognition ?  ?Problem Solving, Memory, Attention  ?TR    ? ? ?Anticipated Outcomes ?Item Anticipated Outcome  ?Self Feeding Independent  ?Swallowing ?   ?  ?Basic self-care ? Modified independent  ?Toileting ? Modified independent ?  ?Bathroom Transfers Modified independent  ?Bowel/Bladder ? manage bowel w mod I  ?Transfers ? mod I basic transfers and bed mobility  ?Locomotion ? mod I household distances, supervision longer distances and min for stairs without rails (home set up)  ?Communication ?    ?Cognition ? [place] I  ?Pain ? pain at or below level 4 with prns  ?Safety/Judgment ? maintain safety w cues  ? ?Therapy Plan: ?PT Intensity: Minimum of 1-2 x/day ,45 to 90 minutes ?PT Frequency: 5 out of 7 days ?PT Duration Estimated Length of Stay: 7-10 days ?OT Intensity: Minimum of 1-2 x/day, 45 to 90 minutes ?OT Frequency: 5 out of 7 days ?OT Duration/Estimated Length of Stay: 7-10 days ?SLP Intensity: Minumum of 1-2 x/day, 30 to 90 minutes ?SLP Frequency: 3 to 5 out of 7  days ?SLP Duration/Estimated Length of Stay: 7-10 days ? ? Team Interventions: ?Nursing Interventions Disease Management/Prevention, Medication Management, Discharge Planning, Pain Management, Bowel Management, Patient/Family Education  ?PT interventions Ambulation/gait training, Training and development officer, Cognitive remediation/compensation, Community reintegration, Discharge planning, Disease management/prevention, DME/adaptive equipment instruction, Functional mobility training, Neuromuscular re-education, Pain management, Patient/family education, Psychosocial support, Stair training, Splinting/orthotics, Therapeutic Activities, Therapeutic Exercise, UE/LE Strength taining/ROM, UE/LE Coordination activities, Wheelchair propulsion/positioning  ?OT Interventions Balance/vestibular training, Discharge planning, Pain management, Functional electrical stimulation, Self Care/advanced ADL retraining, Therapeutic Activities, UE/LE Coordination activities, Cognitive remediation/compensation, Functional mobility training, Patient/family education, Therapeutic Exercise, Visual/perceptual remediation/compensation, Community reintegration, DME/adaptive equipment instruction, Neuromuscular re-education, Psychosocial support, Splinting/orthotics, UE/LE Strength taining/ROM  ?SLP Interventions Cognitive remediation/compensation, Internal/external aids, Therapeutic Activities, Environmental controls, Cueing hierarchy, Functional tasks, Patient/family education  ?TR Interventions    ?SW/CM Interventions Discharge Planning, Psychosocial Support, Patient/Family Education  ? ?Barriers to Discharge ?MD  Medical stability, Home enviroment access/loayout, and Medication compliance  ?Nursing Decreased caregiver support, Home environment access/layout ?1 level 3 ste no rails w son; works 3rd shift and will hire help for patient when he is away  ?PT Decreased caregiver support ?family only able to provide intermittent support  ?OT  Decreased caregiver support ?Patient reports son is not available to offer much assistance.  Patient limited by pain  ?SLP   ?   ?SW Lack of/limited family support, Insurance for SNF coverage ?   ? ?Team Discharge Planning: ?Destination: PT-Home ,OT- Home , SLP-Home ?Projected Follow-up: PT-Home health PT,  OT-  Home health OT, Outpatient OT (would prefer OP), SLP- (TBD) ?Projected Equipment Needs: PT-Rolling walker with 5" wheels, OT- Tub/shower seat, SLP-None recommended by SLP ?Equipment Details: PT- , OT-  ?Patient/family involved in discharge planning: PT- Patient,  OT-Patient, SLP-Patient ? ?MD ELOS: 12-14 days ?Medical Rehab Prognosis:  Good ?Assessment:  ? ?The patient has been admitted for CIR therapies with the diagnosis of  right pontine infarct and punctate left hippocampus and frontal white matter infarcts. The team will be addressing functional mobility, strength, stamina, balance, safety, adaptive techniques and equipment, self-care, bowel and bladder mgt, patient and caregiver education, . Goals have been set at Sup to Mod I. Anticipated discharge destination is home ? ? ?   ? ? ?See Team Conference Notes for weekly updates to the plan of care  ?

## 2022-05-02 NOTE — Progress Notes (Signed)
PROGRESS NOTE   Subjective/Complaints: Patient has no new complaints this morning still excited about [place] for Mother's Day weekend but does report some sadness family is not supportive of her.  I asked about her metformin and she endorses she takes it at home but has not done so in the hospital and she is not sure why.  Did some education with her about diet especially with controlling her blood sugars and and letting her know that she has had some high blood sugars flat lately.  ROS: Positive for LLE pain and numbness which is not new ongoing likely from diabetic neuropathy.  This is mostly in her toes of her left foot.  Objective:   No results found. Recent Labs    04/30/22 0529 05/01/22 0550  WBC 8.6  --   HGB 9.0* 9.1*  HCT 27.9* 28.5*  PLT 308  --    Recent Labs    04/30/22 0529  NA 138  K 4.2  CL 106  CO2 24  GLUCOSE 121*  BUN 15  CREATININE 1.41*  CALCIUM 8.4*    Intake/Output Summary (Last 24 hours) at 05/02/2022 1634 Last data filed at 05/02/2022 1235 Gross per 24 hour  Intake 756 ml  Output --  Net 756 ml        Physical Exam: Vital Signs Blood pressure (!) 148/87, pulse 96, temperature 98 F (36.7 C), resp. rate 14, weight 90.9 kg, SpO2 99 %. Gen: no distress, normal appearing, BMI 36.77 HEENT: oral mucosa pink and moist, NCAT Cardio: Reg rate and rhythm Chest: normal effort, normal rate of breathing Abd: soft, non-distended, +BS all quadrants Ext: no edema Psych: pleasant, normal affect Skin: intact Neuro:  alert and oriented, No aphasia.  Cranial nerves II through XII intact other than decreased facial sensation on the left side can perform finger-to-nose but slow to initiate decree sensation left second toe through fifth toes able to follow one-step commands.  Fluent language but some delayed responses. Musculoskeletal: Tone is normal no joint swelling noted.  IV in both arms 5/5 in  RUE and RLE 4-4+/5 in LLE.  Finger flexion L UE at least 4/5 no shoulder abduction noted appears not limited by pain or effort.  Able to hold elbow flexed against gravity.  Minimal active elbow extension appears to be able to hold arm extended when sitting Limited testing due to limited effort or pain during examination.  Tenderness throughout proximal LUE and pain with passive ROM at elbow and shoulder.  Assessment/Plan: 1. Functional deficits which require 3+ hours per day of interdisciplinary therapy in a comprehensive inpatient rehab setting. Physiatrist is providing close team supervision and 24 hour management of active medical problems listed below. Physiatrist and rehab team continue to assess barriers to discharge/monitor patient progress toward functional and medical goals  Care Tool:  Bathing              Bathing assist Assist Level: Minimal Assistance - Patient > 75%     Upper Body Dressing/Undressing Upper body dressing   What is the patient wearing?: Hospital gown only    Upper body assist Assist Level: Minimal Assistance - Patient > 75%  Lower Body Dressing/Undressing Lower body dressing            Lower body assist Assist for lower body dressing: Minimal Assistance - Patient > 75%     Toileting Toileting    Toileting assist Assist for toileting: Minimal Assistance - Patient > 75% Assistive Device Comment: walker   Transfers Chair/bed transfer  Transfers assist     Chair/bed transfer assist level: Minimal Assistance - Patient > 75%     Locomotion Ambulation   Ambulation assist      Assist level: Minimal Assistance - Patient > 75% Assistive device: No Device Max distance: 20'   Walk 10 feet activity   Assist     Assist level: Minimal Assistance - Patient > 75% Assistive device: No Device   Walk 50 feet activity   Assist Walk 50 feet with 2 turns activity did not occur: Safety/medical concerns (decreased endurnace without  device)         Walk 150 feet activity   Assist Walk 150 feet activity did not occur: Safety/medical concerns         Walk 10 feet on uneven surface  activity   Assist     Assist level: Minimal Assistance - Patient > 75%     Wheelchair     Assist Is the patient using a wheelchair?: Yes (for energy conservation/time management but will not use at d/c anticipated) Type of Wheelchair: Manual    Wheelchair assist level: Dependent - Patient 0%      Wheelchair 50 feet with 2 turns activity    Assist        Assist Level: Dependent - Patient 0%   Wheelchair 150 feet activity     Assist      Assist Level: Dependent - Patient 0%   Blood pressure (!) 148/87, pulse 96, temperature 98 F (36.7 C), resp. rate 14, weight 90.9 kg, SpO2 99 %.  Medical Problem List and Plan: 1. Functional deficits secondary to right pontine infarct and punctate left hippocampus and frontal white matter infarcts. Neurology suspects cardioembolic vs small vessel disease source. 30 Day cardionet monitoring to rule out afib             -patient may shower             -ELOS/Goals: 12-14 days Sup to Mod I with PT/OT/SLP  Continue CIR 2.  Antithrombotics: -DVT/anticoagulation:  Pharmaceutical: Heparin             -antiplatelet therapy: Plavix. Allergy to aspirin 5/13 Plavix alone due to aspirin allergy. 3. Pain Management: Tylenol as needed -Chronic pain syndrome on Xtampza not restarted on admission -continue Lyrica 50 mg daily -restarted Norco 5mg  TID prn 5/13 continue pain management regimen: Lyrica, Norco, Xtampza 4. Anxiety: LCSW to evaluate and provide emotional support             -antipsychotic agents: n/a             -anxiety: continue home Xanax 2mg  BID as needed.               -patient reported history of bipolar disorder: continue trazodone 150 mg q HS.  She is agreeable to neuropsychiatric evaluation 5. Neuropsych: This patient is capable of making decisions on her  own behalf.             --patient reported history of bipolar disorder: q HS.  She is agreeable to neuropsychiatric evaluation 6. Skin/Wound Care: Routine skin care checks 7. Fluids/Electrolytes/Nutrition: Routine I's  and O's and follow-up chemistries 8: Hypertension: monitor TID/prn            --continue Lopressor 50 mg BID           -BP overall has been well controlled 5/13 blood pressure systolic has been mostly in the 140s we will continue to trend May consider increasing Lopressor dose.     05/02/2022    4:20 AM 05/01/2022    8:44 PM 05/01/2022    1:57 PM  Vitals with BMI  Weight 200 lbs 6 oz    BMI 36.64    Systolic  148 147  Diastolic  87 100  Pulse  96 98    9: Chronic systolic heart failure: no diuretics            -Continue metoprolol/losartan/farxiga, consider Entresto if BP tolerates 10: DM-2: monitor CBGs, carb modified diet             --continue Farxiga 5 mg daily             --continue metformin 500 mg BID             --continue SSI 0-15u             -CBGs in 200s-260s, consider increase metformin if remains elevated, discussed diet 5/13 patient has been very noncompliant with her diet with note from nurse that she ate 2 trays at lunch and 2 trays at dinner of very high sugar and very high carbohydrate rate content.  She also had 2 spikes of elevated CBGs at 335 on 5/12, and 411 on 4/11.  She takes metformin 500 mg twice daily at home but on inspection it does not look like she has been taking this in the hospital.  Spoke to pharmacy today and Gerline LegacyPamela Pharm.D. recommended resuming her at home dose of metformin in addition to [place] 10 mg daily.  We will see if her blood sugars equilibrate at a lower level with these changes. 11: CKD 3b: Elevated BUN and creatinine.  Encourage p.o. fluids and follow-up BMP. 5/13 BUN has trended down some from 22-15, creatinine has trended down from 1.65-1.41 continue to encourage p.o. intake and will continue monitoring BMP.    Latest Ref  Rng & Units 04/30/2022    5:29 AM 04/27/2022   12:29 AM 04/26/2022   10:34 PM  BMP  Glucose 70 - 99 mg/dL 086121   [id number]   [id number]    BUN 6 - 20 mg/dL 15   22   22     Creatinine 0.44 - 1.00 mg/dL 6.291.41   5.281.65   4.131.70    Sodium 135 - 145 mmol/L 138   134   131    Potassium 3.5 - 5.1 mmol/L 4.2   4.7   4.6    Chloride 98 - 111 mmol/L 106   103   102    CO2 22 - 32 mmol/L 24   21   21     Calcium 8.9 - 10.3 mg/dL 8.4   8.9   8.7      12: CAD: left heart cath on 04/09/2022 Dr. Algie CofferKadakia; no significant stenosis. Discussed healthy diet 13: Hyperlipidemia: continue Lipitor 80 mg daily, Zetia 10 mg daily 14: PAD: seen in hospital consultation by Dr. Lenell AntuHawken 04/09/2022: follow-up at discharge for abnormal left ABI             --continue Plavix and statin 15: Tobacco use: Recently stopped and is not having cravings 16: Obesity: dietary counseling  17: Left breat mass: s/p lumpectomy 03/25/2022>>non-malignant.  Incision healing. 18: Food allergies: citrus/cucumber/apple juice/tree nuts,peanuts/pickles 19: Lactose intolerant: avoid dairy 20: GERD: Continue Protonix 21. Vitamin D deficiency: start ergocalciferol 50,000U once per week for 7 weeks. 22. Anemia: Hgb stable, monitor weekly 5/13 continue to monitor with weekly labs currently stable.    Latest Ref Rng & Units 05/01/2022    5:50 AM 04/30/2022    5:29 AM 04/27/2022   12:29 AM  CBC  WBC 4.0 - 10.5 K/uL  8.6   11.3    Hemoglobin 12.0 - 15.0 g/dL 9.1   9.0   97.4    Hematocrit 36.0 - 46.0 % 28.5   27.9   34.2    Platelets 150 - 400 K/uL  308   287      23. Insomnia: continue trazodone 50mg  HS 5/13 Currently sleep pattern is stable.  LOS: 3 days A FACE TO FACE EVALUATION WAS PERFORMED  6/13 05/02/2022, 4:34 PM

## 2022-05-02 NOTE — Progress Notes (Signed)
Occupational Therapy Session Note ? ?Patient Details  ?Name: Erin Jackson ?MRN: [id number] ?Date of Birth: 1966-01-02 ? ?Today's Date: 05/02/2022 ?OT Individual Time: 7858-8502 ?OT Individual Time Calculation (min): 60 min  ? ? ?Short Term Goals: ?Week 1:  OT Short Term Goal 1 (Week 1): Patient will ambulate to bathroom with supervision using RW ?OT Short Term Goal 2 (Week 1): Patient wil demonstrate at least 60 degrees of left shoulder flexion (low to mid level reach) without increase in pain in left arm ?OT Short Term Goal 3 (Week 1): Patient will dress her upper body with no more than intermittent min assistance and increased time ?OT Short Term Goal 4 (Week 1): Patient will dress her lower body with no more than intermittent min assistance and increased time ?OT Short Term Goal 5 (Week 1): Patient will complete tub/shower transfer using tub/transfer bench and supervision/cueing ? ?Skilled Therapeutic Interventions/Progress Updates:  ?  Patient received up in wheelchair finishing breakfast.  She had PT earlier this am.  Patient declined shower, opting instead to go to gym for exercise.  Patient walked to gym.  She brought sunglasses, but then wore them on top of her head halfway down the hall.  Patient needed frequent cueing to walk within her walker.  Patient with delayed response to direct verbal prompts.  Patient walking slowly, taking frequent self imposed rest breaks, patient able to walk this distance without loss of balance with contact guard assistance. Patient asking for "treats"-ice cream, popsicle, donut, etc.  There are several nursin notes about patient's diet and reducing sugar and carbohydrates. Responded well to gentle teasing and redirection this session.   ?Supine on mat table worked on LUE active movement/ active assisted movement.  Patient with seemingly little volitional control of Left arm upon demand, but also noted patient uses left arm to scratch right upper arm, to adjust gown, to hold  walker, etc.  Patient reports pain with active assisted movement of shoulder flex with elbow flex/ext.  In sitting, then in standing worked with UE ranger to promote more active supported movement of LUE in forward and lateral reach patterns.  Transported patient back to room via wheelchair.  Safety belt installed and engaged.  Patient states "How do you deal with this mentally."  Then discussed not wanting to be alone at discharge, and inquiring about other options, e.g. Assisted Living Facilities.   ? ?Therapy Documentation ?Precautions:  ?Precautions ?Precautions: Fall ?Precaution Comments: per Ralene Bathe, PA, no limitations to L shoulder s/p rotator cuff repair in March ?Restrictions ?Weight Bearing Restrictions: No ? ? ?Pain: ?Pain Assessment ?Pain Scale: 0-10 ?Pain Score: 0 at start of session but reports pain in left arm "tingling" with requested movement, however when patient spontaneously moves this arm no report of pain ? ? ? ? ?Therapy/Group: Individual Therapy ? ?Collier Salina ?05/02/2022, 11:51 AM ?

## 2022-05-02 NOTE — Progress Notes (Signed)
Speech Language Pathology Daily Session Note ? ?Patient Details  ?Name: Erin Jackson ?MRN: [id number] ?Date of Birth: October 05, 1966 ? ?Today's Date: 05/02/2022 ?SLP Individual Time: [id number] ?SLP Individual Time Calculation (min): 43 min ? ?Short Term Goals: ?Week 1: SLP Short Term Goal 1 (Week 1): STGs=LTGs due to ELOS ? ?Skilled Therapeutic Interventions: Skilled treatment session focused on cognitive goals. Upon arrival, patient had received an early lunch tray. SLP facilitated session by providing overall Min-Mod verbal cues for problem solving during tray set-up as patient attempting to put plate on her bed instead of table resulting in spilling parts of her sandwich on the floor. While patient was eating, SLP facilitated ongoing education regarding maintaining a healthy brain lifestyle. Patient verbalized understanding of education but also reported that it would be difficult to implement certain aspects at times such as social engagement and stress management. Patient also reported a recent decline in both physical and cognitive activities. Patient reported she suspects chronic pain but also depression may be impacting her overall function. Physician made aware. Patient left upright in wheelchair at end of session with alarm on and all needs within reach. Continue with current plan of care.  ?   ? ?Pain ?No/Denies Pain  ? ?Therapy/Group: Individual Therapy ? ?Erin Jackson ?05/02/2022, p ?

## 2022-05-03 LAB — GLUCOSE, CAPILLARY
Glucose-Capillary: 150 mg/dL — ABNORMAL HIGH (ref 70–99)
Glucose-Capillary: 124 mg/dL — ABNORMAL HIGH (ref 70–99)
Glucose-Capillary: 167 mg/dL — ABNORMAL HIGH (ref 70–99)
Glucose-Capillary: 170 mg/dL — ABNORMAL HIGH (ref 70–99)

## 2022-05-03 MED ORDER — METOPROLOL TARTRATE 50 MG PO TABS
100.0000 mg | ORAL_TABLET | Freq: Two times a day (BID) | ORAL | Status: DC
  Administered 2022-05-03 – 2022-05-08 (×10): 100 mg via ORAL
  Filled 2022-05-03 (×10): qty 2

## 2022-05-03 NOTE — Progress Notes (Signed)
Patient continues to be noncompliant with safety measures and diet. Patient was standing up besides the bed by herself and was speaking into the intercom stating "she's not standing she moved her buttcheek" as she was standing on the side of her bad when the nurse walked in. Patient education provided. Will continue to monitor.  ? ? ?Gladstone Lighter, LPN ? ?

## 2022-05-03 NOTE — Progress Notes (Signed)
PROGRESS NOTE   Subjective/Complaints: Patient has no new complaints this morning.  She was seen in bed was was sleepy.  Assisted her up in bed to side of bed.  No overnight complaints.  ROS: Positive for LLE pain and numbness which is not new ongoing likely from diabetic neuropathy.  This is mostly in her toes of her left foot.  Objective:   No results found. Recent Labs    05/01/22 0550  HGB 9.1*  HCT 28.5*   No results for input(s): NA, K, CL, CO2, GLUCOSE, BUN, CREATININE, CALCIUM in the last 72 hours.   Intake/Output Summary (Last 24 hours) at 05/03/2022 1133 Last data filed at 05/03/2022 0832 Gross per 24 hour  Intake 590 ml  Output --  Net 590 ml        Physical Exam: Vital Signs Blood pressure (!) 157/92, pulse (!) 110, temperature 98.5 F (36.9 C), resp. rate 19, weight 90.9 kg, SpO2 100 %. Gen: no distress, normal appearing, BMI 36.77 HEENT: oral mucosa pink and moist, NCAT Cardio: Reg rate and rhythm Chest: normal effort, normal rate of breathing Abd: soft, non-distended, +BS all quadrants Ext: no edema Psych: pleasant, normal affect Skin: intact Neuro:  alert and oriented, No aphasia.  Cranial nerves II through XII intact other than decreased facial sensation on the left side can perform finger-to-nose but slow to initiate decree sensation left second toe through fifth toes able to follow one-step commands.  Fluent language but some delayed responses. Musculoskeletal: Tone is normal no joint swelling noted.  IV in both arms 5/5 in RUE and RLE 4-4+/5 in LLE.  Finger flexion L UE at least 4/5 no shoulder abduction noted appears not limited by pain or effort.  Able to hold elbow flexed against gravity.  Minimal active elbow extension appears to be able to hold arm extended when sitting Limited testing due to limited effort or pain during examination.  Tenderness throughout proximal LUE and pain with passive  ROM at elbow and shoulder. Physical exam unchanged from 05/02/22  Assessment/Plan: 1. Functional deficits which require 3+ hours per day of interdisciplinary therapy in a comprehensive inpatient rehab setting. Physiatrist is providing close team supervision and 24 hour management of active medical problems listed below. Physiatrist and rehab team continue to assess barriers to discharge/monitor patient progress toward functional and medical goals  Care Tool:  Bathing    Body parts bathed by patient: Right arm, Chest, Left arm, Abdomen, Front perineal area, Buttocks, Right upper leg, Left upper leg, Right lower leg, Left lower leg, Face         Bathing assist Assist Level: Set up assist     Upper Body Dressing/Undressing Upper body dressing   What is the patient wearing?: Bra, Dress    Upper body assist Assist Level: Minimal Assistance - Patient > 75%    Lower Body Dressing/Undressing Lower body dressing      What is the patient wearing?: Underwear/pull up     Lower body assist Assist for lower body dressing: Supervision/Verbal cueing     Toileting Toileting    Toileting assist Assist for toileting: Minimal Assistance - Patient > 75% Assistive Device Comment: walker  Transfers Chair/bed transfer  Transfers assist     Chair/bed transfer assist level: Minimal Assistance - Patient > 75%     Locomotion Ambulation   Ambulation assist      Assist level: Minimal Assistance - Patient > 75% Assistive device: No Device Max distance: 20'   Walk 10 feet activity   Assist     Assist level: Minimal Assistance - Patient > 75% Assistive device: No Device   Walk 50 feet activity   Assist Walk 50 feet with 2 turns activity did not occur: Safety/medical concerns (decreased endurnace without device)         Walk 150 feet activity   Assist Walk 150 feet activity did not occur: Safety/medical concerns         Walk 10 feet on uneven surface   activity   Assist     Assist level: Minimal Assistance - Patient > 75%     Wheelchair     Assist Is the patient using a wheelchair?: Yes (for energy conservation/time management but will not use at d/c anticipated) Type of Wheelchair: Manual    Wheelchair assist level: Dependent - Patient 0%      Wheelchair 50 feet with 2 turns activity    Assist        Assist Level: Dependent - Patient 0%   Wheelchair 150 feet activity     Assist      Assist Level: Dependent - Patient 0%   Blood pressure (!) 157/92, pulse (!) 110, temperature 98.5 F (36.9 C), resp. rate 19, weight 90.9 kg, SpO2 100 %.  Medical Problem List and Plan: 1. Functional deficits secondary to right pontine infarct and punctate left hippocampus and frontal white matter infarcts. Neurology suspects cardioembolic vs small vessel disease source. 30 Day cardionet monitoring to rule out afib             -patient may shower             -ELOS/Goals: 12-14 days Sup to Mod I with PT/OT/SLP  Continue CIR 2.  Antithrombotics: -DVT/anticoagulation:  Pharmaceutical: Heparin             -antiplatelet therapy: Plavix. Allergy to aspirin 5/13 Plavix alone due to aspirin allergy. 3. Pain Management: Tylenol as needed -Chronic pain syndrome on Xtampza not restarted on admission -continue Lyrica 50 mg daily -restarted Norco 5mg  TID prn 5/13 continue pain management regimen: Lyrica, Norco, Xtampza 5/14 Pain controlled with current regimen. 4. Anxiety: LCSW to evaluate and provide emotional support             -antipsychotic agents: n/a             -anxiety: continue home Xanax 2mg  BID as needed.               -patient reported history of bipolar disorder: continue trazodone 150 mg q HS.  She is agreeable to neuropsychiatric evaluation 5. Neuropsych: This patient is capable of making decisions on her own behalf.             --patient reported history of bipolar disorder: q HS.  She is agreeable to  neuropsychiatric evaluation 6. Skin/Wound Care: Routine skin care checks 7. Fluids/Electrolytes/Nutrition: Routine I's and O's and follow-up chemistries 8: Hypertension: monitor TID/prn            --continue Lopressor 50 mg BID           -BP overall has been well controlled 5/13 blood pressure systolic has been mostly in the  140s we will continue to trend May consider increasing Lopressor dose. 5/14 Trial of increased metoprolol dose of 100 mg BID from 50 mg BID, BP 157/92, HR 110.  Home lorsartan 25 mg has not been given inpatient, with slightly elevated Crt 1.41.     05/03/2022    5:49 AM 05/02/2022    7:39 PM 05/02/2022    4:20 AM  Vitals with BMI  Weight   200 lbs 6 oz  BMI   0000000  Systolic [id number] 0000000   Diastolic 92 92   Pulse [id number] 98     9: Chronic systolic heart failure: no diuretics            -Continue metoprolol/losartan/farxiga, consider Entresto if BP tolerates -5/14 This admission pt has been given metoprolol and Farxiga but not losartan (Crt 1.41). Continue to trend Crt with qMonday labs before starting ARB.  10: DM-2: monitor CBGs, carb modified diet             --continue Farxiga 5 mg daily             --continue metformin 500 mg BID             --continue SSI 0-15u             -CBGs in 200s-260s, consider increase metformin if remains elevated, discussed diet 5/13 patient has been very noncompliant with her diet with note from nurse that she ate 2 trays at lunch and 2 trays at dinner of very high sugar and very high carbohydrate rate content.  She also had 2 spikes of elevated CBGs at 335 on 5/12, and 411 on 4/11.  She takes metformin 500 mg twice daily at home but on inspection it does not look like she has been taking this in the hospital.  Spoke to pharmacy today and Jeanie Sewer.D. recommended resuming her at home dose of metformin in addition to [place] 10 mg daily.  We will see if her blood sugars equilibrate at a lower level with these changes. 05/03/22 CBG's are  improving with starting metformin 500 mg BID, range 124-207.  CBG (last 3)  Recent Labs    05/02/22 2116 05/03/22 0552 05/03/22 1145  GLUCAP 207* 150* 124*    11: CKD 3b: Elevated BUN and creatinine.  Encourage p.o. fluids and follow-up BMP. 5/13 BUN has trended down some from 22-15, creatinine has trended down from 1.65-1.41 continue to encourage p.o. intake and will continue monitoring BMP. 5/14 Pending qMonday labs.    Latest Ref Rng & Units 04/30/2022    5:29 AM 04/27/2022   12:29 AM 04/26/2022   10:34 PM  BMP  Glucose 70 - 99 mg/dL 121   122   123    BUN 6 - 20 mg/dL 15   22   22     Creatinine 0.44 - 1.00 mg/dL 1.41   1.65   1.70    Sodium 135 - 145 mmol/L 138   134   131    Potassium 3.5 - 5.1 mmol/L 4.2   4.7   4.6    Chloride 98 - 111 mmol/L 106   103   102    CO2 22 - 32 mmol/L 24   21   21     Calcium 8.9 - 10.3 mg/dL 8.4   8.9   8.7      12: CAD: left heart cath on 04/09/2022 Dr. Doylene Canard; no significant stenosis. Discussed healthy diet 13: Hyperlipidemia: continue Lipitor 80 mg daily, Zetia 10  mg daily 14: PAD: seen in hospital consultation by Dr. Stanford Breed 04/09/2022: follow-up at discharge for abnormal left ABI             --continue Plavix and statin 15: Tobacco use: Recently stopped and is not having cravings 16: Obesity: dietary counseling 17: Left breat mass: s/p lumpectomy 03/25/2022>>non-malignant.  Incision healing. 18: Food allergies: citrus/cucumber/apple juice/tree nuts,peanuts/pickles 19: Lactose intolerant: avoid dairy 20: GERD: Continue Protonix 21. Vitamin D deficiency: start ergocalciferol 50,000U once per week for 7 weeks. 22. Anemia: Hgb stable, monitor weekly 5/13 continue to monitor with weekly labs currently stable.    Latest Ref Rng & Units 05/01/2022    5:50 AM 04/30/2022    5:29 AM 04/27/2022   12:29 AM  CBC  WBC 4.0 - 10.5 K/uL  8.6   11.3    Hemoglobin 12.0 - 15.0 g/dL 9.1   9.0   10.8    Hematocrit 36.0 - 46.0 % 28.5   27.9   34.2    Platelets  150 - 400 K/uL  308   287      23. Insomnia: continue trazodone 50mg  HS 5/13 Currently sleep pattern is stable.  LOS: 4 days A FACE TO FACE EVALUATION WAS PERFORMED  Luetta Nutting 05/03/2022, 11:33 AM

## 2022-05-03 NOTE — Progress Notes (Signed)
Physical Therapy Session Note ? ?Patient Details  ?Name: Erin Jackson ?MRN: [id number] ?Date of Birth: Jan 20, 1966 ? ?Today's Date: 05/03/2022 ?PT Individual Time: 1100-1200 ?PT Individual Time Calculation (min): 60 min  ? ?Short Term Goals: ?Week 1:  PT Short Term Goal 1 (Week 1): = LTGs ? ?Skilled Therapeutic Interventions/Progress Updates:  ?   ?Patient sitting EOB with the lights off and her eyes closed upon PT arrival. Patient alert and agreeable to PT session. Patient reported 6-7/10 headache with photophobia during session, LPN made aware. Patient utilized sunglasses for relief and PT provided repositioning, rest breaks, and distraction as pain interventions throughout session.  ? ?Focused session on vestibular assessment for intermittent dizziness/imbalance with mobility. Assessment and history completed below. Patient agreeable to assessment and testing performed. Reports history of spinal stenosis with reduce cervical extension. Utilized side-lying test with bed in trendelenburg to accommodate ROM restrictions. Patient with poor tolerance to testing and testing was terminated upon patient's request. Educated patient on findings and recommendations. Patient stated understanding and appreciative.  ? ?Findings: Patient presents with symptoms consistent with oculomotor dysfunction, orthostatic hypotension, and possible motion sensitivity vs BPPV, requires further testing to confirm. ? ?Recommendations:  ?-Medical management of symptoms of dizziness and headache prior to further testing or treatment due to severity of symptoms, MD made aware.  ?-Continued use of sunglasses for photophobia and motion sensitivity with mobility, encourage patient not to wear sunglasses at rest as tolerated to increase tolerance. ?-Repeat testing for BPPV with medication to improve patient tolerance with testing and intervention. ?-Initiate static visual accomodation exercises and VOR cancellation in sitting in a quiet environment  and progress as tolerated.  ? ?Therapeutic Activity: ?Bed Mobility: Patient performed supine to/from sit with min A in a flat bed without use of bed rails. Provided verbal cues for progressing through side-lying and use of bottom elbow for trunk control. ?Transfers: Patient performed sit to/from stand x1 with CGA-min A without an AD. Provided verbal cues for forward weight shift and erect posture. ? ? Vestibular Assessment - 05/03/22 0001   ? ?  ? Symptom Behavior  ? Subjective history of current problem Patient with pontine infarct with primary symptoms of dizziness/imbalance with falls x4, no LOC, unknown head trauma   ? Type of Dizziness  Blurred vision;Imbalance;Unsteady with head/body turns;Lightheadedness;"World moves"   ? Frequency of Dizziness intermittent   ? Duration of Dizziness >60 sec   ? Symptom Nature Motion provoked;Positional;Spontaneous;Intermittent   ? Aggravating Factors Activity in general;Moving eyes;Spontaneous onset;Looking up to the ceiling;Sit to stand   ? Relieving Factors Head stationary;Dark room;Closing eyes;Slow movements;Rest   ? Progression of Symptoms Better   ? History of similar episodes none   ?  ? Oculomotor Exam  ? Oculomotor Alignment Normal   ? Ocular ROM unable to tolerate superior ROM bilaterally   ? Spontaneous Absent   ? Gaze-induced  Absent   ? Head shaking Horizontal Comment   unable to tolerate, eyes closed  ? Head Shaking Vertical Comment   unable to tolerate, eyes closed  ? Smooth Pursuits Saccades   ? Saccades Poor trajectory;Slow   ? Comment very limited by symptoms of dizziness and eye strain with headache   ?  ? Oculomotor Exam-Fixation Suppressed   ? Left Head Impulse hx of spinal stenosis   ? Right Head Impulse hx of spinal stenosis   ?  ? Positional Testing  ? Sidelying Test Sidelying Right   ?  ? Sidelying Right  ? Sidelying Right Duration  45-60 sec   ? Sidelying Right Symptoms No nystagmus;Other (comment)   significant imbalance with patient moving and  stating "I'm falling" patient with poor tolerance and asked to terminate testing due to severity of symptoms; resolved <60 sec and returned with sit up from side-lying <60 sec  ?  ? Orthostatics  ? BP supine (x 5 minutes) 141/92   ? HR supine (x 5 minutes) 93   ? BP sitting 145/103   ? HR sitting 99   ? BP standing (after 1 minute) 117/87   ? HR standing (after 1 minute) 89   ? BP standing (after 3 minutes) --   patient did not tolerate standing x3 min  ? Orthostatics Comment BP droped >20 points systolic and >10 points diastolic from sit to stand, recommend compression (TEDs or ACE wraps per patient preferrence) with mobility with reassessment   ? ?  ?  ? ?  ? ? ?Patient sitting EOB at end of session with breaks locked, bed alarm set, and all needs within reach.  ? ?Therapy Documentation ?Precautions:  ?Precautions ?Precautions: Fall ?Precaution Comments: per Ralene Bathe, PA, no limitations to L shoulder s/p rotator cuff repair in March ?Restrictions ?Weight Bearing Restrictions: No ? ? ? ?Therapy/Group: Individual Therapy ? ?Helayne Seminole PT, DPT ? ?05/03/2022, 12:38 PM  ?

## 2022-05-04 DIAGNOSIS — I63119 Cerebral infarction due to embolism of unspecified vertebral artery: Secondary | ICD-10-CM

## 2022-05-04 LAB — CBC
HCT: 31.4 % — ABNORMAL LOW (ref 36.0–46.0)
Hemoglobin: 9.6 g/dL — ABNORMAL LOW (ref 12.0–15.0)
MCH: 28.3 pg (ref 26.0–34.0)
MCHC: 30.6 g/dL (ref 30.0–36.0)
MCV: 92.6 fL (ref 80.0–100.0)
Platelets: 389 10*3/uL (ref 150–400)
RBC: 3.39 MIL/uL — ABNORMAL LOW (ref 3.87–5.11)
RDW: 13.3 % (ref 11.5–15.5)
WBC: 7.3 10*3/uL (ref 4.0–10.5)
nRBC: 0 % (ref 0.0–0.2)

## 2022-05-04 LAB — GLUCOSE, CAPILLARY
Glucose-Capillary: 168 mg/dL — ABNORMAL HIGH (ref 70–99)
Glucose-Capillary: 130 mg/dL — ABNORMAL HIGH (ref 70–99)
Glucose-Capillary: 135 mg/dL — ABNORMAL HIGH (ref 70–99)
Glucose-Capillary: 220 mg/dL — ABNORMAL HIGH (ref 70–99)

## 2022-05-04 LAB — BASIC METABOLIC PANEL
Anion gap: 10 (ref 5–15)
BUN: 21 mg/dL — ABNORMAL HIGH (ref 6–20)
CO2: 22 mmol/L (ref 22–32)
Calcium: 8.3 mg/dL — ABNORMAL LOW (ref 8.9–10.3)
Chloride: 104 mmol/L (ref 98–111)
Creatinine, Ser: 1.35 mg/dL — ABNORMAL HIGH (ref 0.44–1.00)
GFR, Estimated: 46 mL/min — ABNORMAL LOW (ref >60)
Glucose, Bld: 216 mg/dL — ABNORMAL HIGH (ref 70–99)
Potassium: 4 mmol/L (ref 3.5–5.1)
Sodium: 136 mmol/L (ref 135–145)

## 2022-05-04 MED ORDER — ENOXAPARIN SODIUM 40 MG/0.4ML IJ SOSY
40.0000 mg | PREFILLED_SYRINGE | Freq: Every day | INTRAMUSCULAR | Status: DC
Start: 2022-05-04 — End: 2022-05-08
  Administered 2022-05-04 – 2022-05-08 (×5): 40 mg via SUBCUTANEOUS
  Filled 2022-05-04 (×5): qty 0.4

## 2022-05-04 NOTE — Progress Notes (Addendum)
Speech Language Pathology Daily Session Note ? ?Patient Details  ?Name: Erin Jackson ?MRN: [id number] ?Date of Birth: 02/22/1966 ? ?Today's Date: 05/04/2022 ?SLP Individual Time: 5929-2446 ?SLP Individual Time Calculation (min): 45 min ? ?Short Term Goals: ?Week 1: SLP Short Term Goal 1 (Week 1): STGs=LTGs due to ELOS ? ?Skilled Therapeutic Interventions: ?Pt seen this date for skilled ST intervention targeting cognitive-linguistic goals outlined in care plan. Pt received lying in bed with eyes closed; aroused to name. Initially verbalized upset re: lack of privacy and dignity with CIR admission and her current LPN. SLP provided supportive listening and validation. Upon inquiry, pt reports she would prefer female nursing care; care coordinator made aware. Tearful at times. Agreeable to ST intervention with encouragement. ? ?SLP facilitated today's session by providing Min A for verbal problem-solving + safety awareness, and Total A for re-education re: safety protocols to include fall precautions and diet modifications currently in place. Pt voiced understanding of these policies, though verbalized frustration with lack of independence and privacy that has been allowed during her CIR admission. Reviewed current deficits and rationale for safety measures. Pt reports to this therapist a hx of depression and that she is currently feeling very down; neuropsychological evaluation completed this AM. Stated 2 medications she takes for her depression and anxiety with Sup A. With Mod verbal A, pt confirmed that she enjoys music and utilizes this for relaxation and mood at times. Pt named 3 out of 3 musicians she enjoys listening to with Mod I. Brief moment of word finding difficulty noted with frustration, though pt recovered and utilized compensatory word finding strategy (circumlocution) independently. Pt verbalized understanding and appreciative of care and listening. ? ?Pt left in w/c with safety belt donned, call bell  within reach, and all immediate needs met. Continue per current ST POC. ? ?Pain ?No pain reported this session; NAD ? ?Therapy/Group: Individual Therapy ? ?Jakeline Dave A Babatunde Seago ?05/04/2022, 3:14 PM ?

## 2022-05-04 NOTE — Progress Notes (Signed)
Patient is completely disregarding safety measures that were put in place. On 05/04/2022 at 0807, patient decided to stand up and walk to the bathroom on her own without an assistive device or staff present in the room. Patient is ignoring the bed alarm and upon reeducation, patient states that she "does not care" and that she is "not an involent." Patient also stated that she was going to take a shower while she was in the bathroom by herself and nurse stated to her that now is not the time for that. Patient reeducated but will likely continue to ignore safety measures. Charge nurse notified. MD notified. May trial telesitter. Will continue to monitor.  ? ? ?Rito Ehrlich, LPN  ?

## 2022-05-04 NOTE — Progress Notes (Signed)
Physical Therapy Session Note ? ?Patient Details  ?Name: Erin Jackson ?MRN: [id number] ?Date of Birth: 01-17-1966 ? ?Today's Date: 05/04/2022 ?PT Individual Time: 1157-2620 + 1500-1530 ?PT Individual Time Calculation (min): 15 min  + 30 min ? ?Short Term Goals: ?Week 1:  PT Short Term Goal 1 (Week 1): = LTGs ? ?Skilled Therapeutic Interventions/Progress Updates:  ?   ?1st session: ?Pt missed first 15 minutes of session as she was being seen by neuropsychologist.  ? ?Upon returning to room, pt 1/2 lying on the bed with her LLE hanging off - sunglasses on. Has very flat affect but agreeable to PT tx. Retrieved a 2nd hospital gown to maintain modesty to cover her backside. Donned hospital socks with totalA. Sit<>stand transfer with supervision and stand<>pivot transfer to w/c with supervision.  ? ?MD entering room and pt requesting to speak with MD alone. She missed an additional 45 minutes of session because of this.  ? ?LPN made aware of pt status and missed therapy time due to meeting with MD. Will attempt to make up missed time as schedule and pt availability allows. ? ?2nd session: ?Pt seen sitting EOB to start session. She's agreeable to PT tx. But she reports frustration regarding CIR policies regarding mobilizing without assistance; she specifically reports frustration with her LPN - notified at end of session. Pt also trying to call food services to place order - had difficulty placing what she would like to do food restrictions. Pt reporting that CIR policies are invading her privacy and her rights - Pt somewhat difficult to reason with and required gentle redirection. Able to mobilize OOB with RW - sit<>stand to RW with supervision. Ambulates to her doorway and out in the hallway (~58ft) with supervision and RW - gait speed slowed and decreased - no overt sign of LOB or knee buckling. Pt c/o dizziness and wanting to return to her room - dizziness appearing exacerbated and self limiting. Assisted her to supine  with safety with supervision assist. Call bell in reach, bed alarm on.  ? ?Therapy Documentation ?Precautions:  ?Precautions ?Precautions: Fall ?Precaution Comments: per Ralene Bathe, PA, no limitations to L shoulder s/p rotator cuff repair in March ?Restrictions ?Weight Bearing Restrictions: No ?General: ?  ? ? ?Therapy/Group: Individual Therapy ? ?Yacine Garriga P Waleed Dettman ?05/04/2022, 7:35 AM  ?

## 2022-05-04 NOTE — Consult Note (Signed)
Neuropsychological Consultation   Patient:   Erin Jackson   DOB:   Feb 26, 1966  MR Number:  [id number]  Location:  [place] [facility] [place] [id number] [place] [address] Dept: [place]: [phone]           Date of Service:   05/04/2022  Start Time:   8 AM End Time:   9 AM  Provider/Observer:  Ilean Skill, Psy.D.       Clinical Neuropsychologist       Billing Code/Service: [phone]  Chief Complaint:    Erin Jackson is a 56 year old female who has a past medical history including chronic kidney disease, hyperlipidemia, peripheral artery disease, type 2 diabetes, heart failure, chronic pain.  Patient presented to the emergency department on 04/20/2022 complaining of shortness of breath accompanied by nausea and vomiting over the prior 2 weeks.  Patient was significantly hypertensive with systolic blood pressure in the 200s and persistent tachycardia.  Patient became increasingly unresponsive.  MRI consistent with right pontine infarct and punctuate left frontal white matter infarcts.  Patient completed her care at ICU and was recommended for referral to the comprehensive inpatient rehabilitation unit.  Patient continues to have left-sided motor deficits.  Reason for Service:  Patient was referred for neuropsychological consultation due to coping and adjustment issues and dealing with ongoing chronic pain and medical issues both from her acute more recent cerebrovascular accident as well as longstanding issues.  Below is the HPI for the current admission.  HPI: Erin Jackson is a 56 year old female with PMH CKD, HLD, PAD, DM2, Hear failure, chronic pain who presented to the [facility] emergency department on 04/20/2022 complaining of shortness of breath accompanied by nausea and vomiting over the past 2 weeks.  She complained of generalized weakness, dizziness, headache and dizziness.  She also stated she had  some left-sided numbness to her face.  She was significantly hypertension with a systolic blood pressure in the 200s and persistent tachycardia with a rate of 130.  She became increasingly more unresponsive.  She was started on nicardipine drip and critical care medicine consulted.  CT of head with no acute abnormality.  Encephalopathy with most likely etiology due to malignant hypertension versus PRES. The following day, she endorsed right facial numbness and left fifth digit of the hand and foot numbness.  Hypodensity on CT of the right pons noted and patient was started on antiplatelet therapy with Plavix.  EEG negative for seizures.  MRI consistent with right pontine infarct and punctate left hypocamptothecin frontal white matter infarcts.  2D echo with ejection fraction of 40 to 45% without PFO. Carotid doppler unremarkable. Urine drug screen positive for THC.  Patient has multiple food allergies. She tolerating regular, carb modified diet. The patient requires inpatient medicine and rehabilitation evaluations and services for ongoing dysfunction secondary to right pontine infarct.   Recommend 30-day CardioNet monitoring to rule out atrial fibrillation.   Her primary complaint today is persistent generalized itching and lack of seasoning, particularly salt to current diet.  She is also adamant that she has not been satisfied with her current primary care provider and wishes to pursue a new primary medical physician.  She recently quit smoking and denies cravings.  Says she does not need NicoDerm.  She states she drinks alcohol on an occasional social basis.     She reports pain in her LUE elbow and shoulder worsened by movement. Reports history of L rotator  cuff surgery.   Current Status:  Patient was sitting on the edge of her bed with her feet hanging over awake and alert when I entered the room.  Patient had some slowed information processing and did acknowledge difficulties with memory and the  assessment and indicated that it was more primary retrieval deficits as far as cognitive issues.  She was generally oriented but slowed information processing speed.  Patient acknowledged difficulties with needing regular assistance and also reported that she has had times where she tried to do more than she was supposed to as far as physically moving around.  Patient has great concerns that there is nobody in her house that would be able to help her upon discharge and that the son that would be most helpful does not live in the area and the son that lives in the area would not be reliable as far as a caregiver.  Patient reports that she has always been the 1 who primarily assisted others and it is very difficult for her to cope and manage with her significant loss.  Behavioral Observation: Erin Jackson  presents as a 56 y.o.-year-old Right handed African American Female who appeared her stated age. her dress was Appropriate and she was Well Groomed and her manners were Appropriate to the situation.  her participation was indicative of Appropriate and Inattentive behaviors.  There were physical disabilities noted.  she displayed an appropriate level of cooperation and motivation.     Interactions:    Active Inattentive and Redirectable  Attention:   abnormal and attention span and concentration were age appropriate  Memory:   abnormal; remote memory intact, recent memory impaired  Visuo-spatial:  not examined  Speech (Volume):  low  Speech:   normal; slurred  Thought Process:  Coherent and Circumstantial  Though Content:  WNL; not suicidal and not homicidal  Orientation:   person, place, time/date, and situation  Judgment:   Fair  Planning:   Poor  Affect:    Depressed  Mood:    Dysphoric  Insight:   Fair  Intelligence:   normal  Medical History:   Past Medical History:  Diagnosis Date   Anxiety    Chronic back pain    Diabetes mellitus without complication (HCC)    GERD  (gastroesophageal reflux disease)    Hypertension          Patient Active Problem List   Diagnosis Date Noted   Embolic stroke ([place]) 0000000   Stage 3b chronic kidney disease ([place])    Chronic systolic heart failure ([place])    Cerebral embolism with cerebral infarction 04/22/2022   Hypertensive urgency 04/21/2022   Altered mental status    Dizziness    Nausea    CAD (coronary artery disease) 04/10/2022   PVD (peripheral vascular disease) ([place]) 04/10/2022   Ischemic cardiomyopathy [id number]   Acute systolic CHF (congestive heart failure) ([place]) 04/10/2022   Allergic reaction 04/08/2022   Angina at rest [facility]) 04/08/2022   Osteoarthritis of carpometacarpal ([place]) joint of right thumb 06/18/2020   Primary osteoarthritis of first carpometacarpal joint of right hand 11/09/2019   Right carpal tunnel syndrome 11/09/2019   De Quervain's tenosynovitis, right 11/09/2019   Arthritis of carpometacarpal ([facility]) joints of both thumbs 11/04/2017   Chronic neck pain 08/20/2017   Supraclavicular mass 08/20/2017   Primary hypertension 08/20/2017   T2DM (type 2 diabetes mellitus) ([place]) 08/20/2017   GERD (gastroesophageal reflux disease) 08/20/2017   Cigarette nicotine dependence without complication 0000000  Psychiatric History:  Patient does have a past history of anxiety and acknowledges a lot of worry and concern about her significant loss of motor functioning and how well she will be able to return to caring for herself.  Family Med/Psych History:  Family History  Problem Relation Age of Onset   Breast cancer Maternal Aunt    Breast cancer Maternal Grandmother     Impression/DX:  Erin Jackson is a 56 year old female who has a past medical history including chronic kidney disease, hyperlipidemia, peripheral artery disease, type 2 diabetes, heart failure, chronic pain.  Patient presented to the emergency department on 04/20/2022 complaining of shortness of breath accompanied by nausea and  vomiting over the prior 2 weeks.  Patient was significantly hypertensive with systolic blood pressure in the 200s and persistent tachycardia.  Patient became increasingly unresponsive.  MRI consistent with right pontine infarct and punctuate left frontal white matter infarcts.  Patient completed her care at ICU and was recommended for referral to the comprehensive inpatient rehabilitation unit.  Patient continues to have left-sided motor deficits.  Patient was sitting on the edge of her bed with her feet hanging over awake and alert when I entered the room.  Patient had some slowed information processing and did acknowledge difficulties with memory and the assessment and indicated that it was more primary retrieval deficits as far as cognitive issues.  She was generally oriented but slowed information processing speed.  Patient acknowledged difficulties with needing regular assistance and also reported that she has had times where she tried to do more than she was supposed to as far as physically moving around.  Patient has great concerns that there is nobody in her house that would be able to help her upon discharge and that the son that would be most helpful does not live in the area and the son that lives in the area would not be reliable as far as a caregiver.  Patient reports that she has always been the 1 who primarily assisted others and it is very difficult for her to cope and manage with her significant loss.  Disposition/Plan:  Today we worked on coping and adjustment issues around with significant frustration around motor deficits and changes in cognitive functioning.  Diagnosis:    Residual neurological deficits following cerebrovascular accident secondary to hypertensive crisis.         Electronically Signed   _______________________ Ilean Skill, Psy.D. Clinical Neuropsychologist

## 2022-05-04 NOTE — Progress Notes (Signed)
PROGRESS NOTE   Subjective/Complaints: She is upset that she was incontinent on herself 3 times yesterday since staff were not able to attend to her immediately and she urinates about 4 minutes after she feels the urge  ROS: Positive for LLE pain and numbness which is not new ongoing likely from diabetic neuropathy.  This is mostly in her toes of her left foot, +urinary urgency  Objective:   No results found. Recent Labs    05/04/22 0746  WBC 7.3  HGB 9.6*  HCT 31.4*  PLT 389   Recent Labs    05/04/22 0746  NA 136  K 4.0  CL 104  CO2 22  GLUCOSE 216*  BUN 21*  CREATININE 1.35*  CALCIUM 8.3*     Intake/Output Summary (Last 24 hours) at 05/04/2022 1013 Last data filed at 05/04/2022 0700 Gross per 24 hour  Intake 480 ml  Output --  Net 480 ml        Physical Exam: Vital Signs Blood pressure (!) 146/72, pulse 99, temperature 98.4 F (36.9 C), temperature source Oral, resp. rate 14, weight 90.9 kg, SpO2 100 %. Gen: no distress, normal appearing, BMI 36.65 HEENT: oral mucosa pink and moist, NCAT Cardio: Reg rate and rhythm Chest: normal effort, normal rate of breathing Abd: soft, non-distended, +BS all quadrants Ext: no edema Psych: depressed mood.  Skin: intact Neuro:  alert and oriented, No aphasia.  Cranial nerves II through XII intact other than decreased facial sensation on the left side can perform finger-to-nose but slow to initiate decree sensation left second toe through fifth toes able to follow one-step commands.  Fluent language but some delayed responses. Musculoskeletal: Tone is normal no joint swelling noted.  IV in both arms 5/5 in RUE and RLE 4-4+/5 in LLE.  Finger flexion L UE at least 4/5 no shoulder abduction noted appears not limited by pain or effort.  Able to hold elbow flexed against gravity.  Minimal active elbow extension appears to be able to hold arm extended when sitting Limited  testing due to limited effort or pain during examination.  Tenderness throughout proximal LUE and pain with passive ROM at elbow and shoulder. Physical exam unchanged from 05/02/22  Assessment/Plan: 1. Functional deficits which require 3+ hours per day of interdisciplinary therapy in a comprehensive inpatient rehab setting. Physiatrist is providing close team supervision and 24 hour management of active medical problems listed below. Physiatrist and rehab team continue to assess barriers to discharge/monitor patient progress toward functional and medical goals  Care Tool:  Bathing    Body parts bathed by patient: Right arm, Chest, Left arm, Abdomen, Front perineal area, Buttocks, Right upper leg, Left upper leg, Right lower leg, Left lower leg, Face         Bathing assist Assist Level: Set up assist     Upper Body Dressing/Undressing Upper body dressing   What is the patient wearing?: Bra, Dress    Upper body assist Assist Level: Minimal Assistance - Patient > 75%    Lower Body Dressing/Undressing Lower body dressing      What is the patient wearing?: Underwear/pull up     Lower body assist Assist for  lower body dressing: Supervision/Verbal cueing     Toileting Toileting    Toileting assist Assist for toileting: Minimal Assistance - Patient > 75% Assistive Device Comment: walker   Transfers Chair/bed transfer  Transfers assist     Chair/bed transfer assist level: Minimal Assistance - Patient > 75%     Locomotion Ambulation   Ambulation assist      Assist level: Minimal Assistance - Patient > 75% Assistive device: No Device Max distance: 20'   Walk 10 feet activity   Assist     Assist level: Minimal Assistance - Patient > 75% Assistive device: No Device   Walk 50 feet activity   Assist Walk 50 feet with 2 turns activity did not occur: Safety/medical concerns (decreased endurnace without device)         Walk 150 feet activity   Assist  Walk 150 feet activity did not occur: Safety/medical concerns         Walk 10 feet on uneven surface  activity   Assist     Assist level: Minimal Assistance - Patient > 75%     Wheelchair     Assist Is the patient using a wheelchair?: Yes (for energy conservation/time management but will not use at d/c anticipated) Type of Wheelchair: Manual    Wheelchair assist level: Dependent - Patient 0%      Wheelchair 50 feet with 2 turns activity    Assist        Assist Level: Dependent - Patient 0%   Wheelchair 150 feet activity     Assist      Assist Level: Dependent - Patient 0%   Blood pressure (!) 146/72, pulse 99, temperature 98.4 F (36.9 C), temperature source Oral, resp. rate 14, weight 90.9 kg, SpO2 100 %.  Medical Problem List and Plan: 1. Functional deficits secondary to right pontine infarct and punctate left hippocampus and frontal white matter infarcts. Neurology suspects cardioembolic vs small vessel disease source. 30 Day cardionet monitoring to rule out afib             -patient may shower             -ELOS/Goals: 12-14 days Sup to Mod I with PT/OT/SLP  Continue CIR 2.  Antithrombotics: -DVT/anticoagulation:  Pharmaceutical: Heparin             -antiplatelet therapy: Plavix. Allergy to aspirin 5/13 Plavix alone due to aspirin allergy. 3. Pain Management: Tylenol as needed -Chronic pain syndrome on Xtampza not restarted on admission -continue Lyrica 50 mg daily -restarted Norco 5mg  TID prn 5/13 continue pain management regimen: Lyrica, Norco, Xtampza 5/14 Pain controlled with current regimen. 4. Anxiety: LCSW to evaluate and provide emotional support             -antipsychotic agents: n/a             -anxiety: continue home Xanax 2mg  BID as needed.               -patient reported history of bipolar disorder: continue trazodone 150 mg q HS.  She is agreeable to neuropsychiatric evaluation 5. Decreased safety awareness: Telesitter is  ordered. This patient is capable of making decisions on her own behalf. 6. Skin/Wound Care: Routine skin care checks 7. Fluids/Electrolytes/Nutrition: Routine I's and O's and follow-up chemistries 8: Hypertension: monitor TID/prn            --continue Lopressor 50 mg BID           -BP overall has been  well controlled 5/13 blood pressure systolic has been mostly in the 140s we will continue to trend May consider increasing Lopressor dose. 5/14 Trial of increased metoprolol dose of 100 mg BID from 50 mg BID, BP 157/92, HR 110.  Home lorsartan 25 mg has not been given inpatient, with slightly elevated Crt 1.41.     05/04/2022    4:35 AM 05/03/2022    7:38 PM 05/03/2022    2:11 PM  Vitals with BMI  Systolic 146 157 161  Diastolic 72 88 95  Pulse 99 96 104    9: Chronic systolic heart failure: no diuretics            -Continue metoprolol/losartan/farxiga, consider Entresto if BP tolerates -5/14 This admission pt has been given metoprolol and Farxiga but not losartan (Crt 1.41). Continue to trend Crt with qMonday labs before starting ARB.  10: DM-2: monitor CBGs, carb modified diet             --continue Farxiga 5 mg daily             --continue metformin 500 mg BID             --continue SSI 0-15u             -CBGs in 200s-260s, consider increase metformin if remains elevated, discussed diet 5/13 patient has been very noncompliant with her diet with note from nurse that she ate 2 trays at lunch and 2 trays at dinner of very high sugar and very high carbohydrate rate content.  She also had 2 spikes of elevated CBGs at 335 on 5/12, and 411 on 4/11.  She takes metformin 500 mg twice daily at home but on inspection it does not look like she has been taking this in the hospital.  Spoke to pharmacy today and Gerline Legacy.D. recommended resuming her at home dose of metformin in addition to [place] 10 mg daily.  We will see if her blood sugars equilibrate at a lower level with these changes. 05/03/22  CBG's are improving with starting metformin 500 mg BID, range 124-207.  CBG (last 3)  Recent Labs    05/03/22 1640 05/03/22 2121 05/04/22 0605  GLUCAP 167* 170* 168*    11: CKD 3b: Elevated BUN and creatinine.  Encourage p.o. fluids and follow-up BMP. 5/13 BUN has trended down some from 22-15, creatinine has trended down from 1.65-1.41 continue to encourage p.o. intake and will continue monitoring BMP. 5/14 Pending qMonday labs.    Latest Ref Rng & Units 05/04/2022    7:46 AM 04/30/2022    5:29 AM 04/27/2022   12:29 AM  BMP  Glucose 70 - 99 mg/dL 096   045   409    BUN 6 - 20 mg/dL Creatinine 0.44 - 1.00 mg/dL 8.11   9.14   7.82    Sodium 135 - 145 mmol/L 136   138   134    Potassium 3.5 - 5.1 mmol/L 4.0   4.2   4.7    Chloride 98 - 111 mmol/L 104   106   103    CO2 22 - 32 mmol/L Calcium 8.9 - 10.3 mg/dL 8.3   8.4   8.9      12: CAD: left heart cath on 04/09/2022 Dr. Algie Coffer; no significant stenosis. Discussed healthy diet 13: Hyperlipidemia: continue Lipitor 80 mg daily, Zetia 10 mg  daily 14: PAD: seen in hospital consultation by Dr. Lenell AntuHawken 04/09/2022: follow-up at discharge for abnormal left ABI             --continue Plavix and statin 15: Tobacco use: Recently stopped and is not having cravings 16: Obesity: dietary counseling 17: Left breat mass: s/p lumpectomy 03/25/2022>>non-malignant.  Incision healing. 18: Food allergies: citrus/cucumber/apple juice/tree nuts,peanuts/pickles 19: Lactose intolerant: avoid dairy 20: GERD: Continue Protonix 21. Vitamin D deficiency: start ergocalciferol 50,000U once per week for 7 weeks. 22. Anemia: Hgb stable, monitor weekly 5/13 continue to monitor with weekly labs currently stable.    Latest Ref Rng & Units 05/04/2022    7:46 AM 05/01/2022    5:50 AM 04/30/2022    5:29 AM  CBC  WBC 4.0 - 10.5 K/uL 7.3    8.6    Hemoglobin 12.0 - 15.0 g/dL 9.6   9.1   9.0    Hematocrit 36.0 - 46.0 % 31.4   28.5   27.9     Platelets 150 - 400 K/uL 389    308      23. Insomnia: continue trazodone 50mg  HS 5/13 Currently sleep pattern is stable. 23. Urinary urgency: ordered bladder program 24. History of bipolar disorder: have placed on neuropsych schedule  LOS: 5 days A FACE TO FACE EVALUATION WAS PERFORMED  Deauna Yaw P Kenna Kirn 05/04/2022, 10:13 AM

## 2022-05-04 NOTE — Progress Notes (Signed)
Pt states she is not allergic to grapefruit. In the chart it is marked as high priority. Please discuss with patient. ?

## 2022-05-04 NOTE — Progress Notes (Signed)
Occupational Therapy Session Note ? ?Patient Details  ?Name: Erin Jackson ?MRN: [id number] ?Date of Birth: 1966-03-13 ? ?Today's Date: 05/04/2022 ?OT Individual Time: 7322-0254 ?OT Individual Time Calculation (min): 60 min  ? ? ?Short Term Goals: ?Week 1:  OT Short Term Goal 1 (Week 1): Patient will ambulate to bathroom with supervision using RW ?OT Short Term Goal 2 (Week 1): Patient wil demonstrate at least 60 degrees of left shoulder flexion (low to mid level reach) without increase in pain in left arm ?OT Short Term Goal 3 (Week 1): Patient will dress her upper body with no more than intermittent min assistance and increased time ?OT Short Term Goal 4 (Week 1): Patient will dress her lower body with no more than intermittent min assistance and increased time ?OT Short Term Goal 5 (Week 1): Patient will complete tub/shower transfer using tub/transfer bench and supervision/cueing ? ?Skilled Therapeutic Interventions/Progress Updates:  ?  Subjective: Pt states she is frustrated because nursing treats her like an invalid and she feels that her dignity is being compromised due to staff having to provide assist during toileting.  Reports discomfort due to bilateral foot swelling.  Also c/o sharp pain with AA/AROM left shoulder and requested heating pad. Nurse made aware of pts complaints and this therapist recommended pt receive assist for ambulation to/from toilet, then provide distant supervision during toileting to allow pt privacy.    ?  Objective:  Pt semi reclined in bed. Supine to sit with supervision.  Sit to stand and ambulation to toilet using RW with close supervision.  Toilet transfer and toileting completed with setup.  Pt ambulated a few steps and turn pivoted to shower bench with close supervision.  Pt requesting to close curtain during bathing for privacy therfore therapist provided distant supervision only and unable to assess thoroughness or safety during UB/LB dressing and bathing however no overt  safety concerns arose during this time.  Pt dried off with CGA with on LOB in standing and able to self recover.  Ambulated with close supervision to toilet and donned clean shirt and underwear and shorts with distant supervision.  Ambulated to EOB and sit to supine with supervision.  Encouraged pt to participate in AAROM left shoulder.  Pt required significantly extended time to complete 3 reps modified shoulder flexion (chest press) using weightless dowel and reporting significant pain therefore discontinued.  Call bell in reach, seat alarm on.   ?  Assessment:  Pt making progress evidenced by increased independence during self care.  Primary barriers today included pain in left shoulder limiting progress with AAROM. ?  Plan: Pt would benefit from further training on AAROM/AROM left shoulder.  ? ?Therapy Documentation ?Precautions:  ?Precautions ?Precautions: Fall ?Precaution Comments: per Ralene Bathe, PA, no limitations to L shoulder s/p rotator cuff repair in March ?Restrictions ?Weight Bearing Restrictions: No ? ? ? ?Therapy/Group: Individual Therapy ? ?Dian Situ Jennfier Abdulla ?05/04/2022, 2:39 PM ?

## 2022-05-05 LAB — GLUCOSE, CAPILLARY
Glucose-Capillary: 199 mg/dL — ABNORMAL HIGH (ref 70–99)
Glucose-Capillary: 188 mg/dL — ABNORMAL HIGH (ref 70–99)
Glucose-Capillary: 249 mg/dL — ABNORMAL HIGH (ref 70–99)
Glucose-Capillary: 194 mg/dL — ABNORMAL HIGH (ref 70–99)

## 2022-05-05 MED ORDER — MAGNESIUM GLUCONATE 500 MG PO TABS
250.0000 mg | ORAL_TABLET | Freq: Every day | ORAL | Status: DC
  Administered 2022-05-05: 250 mg via ORAL
  Filled 2022-05-05: qty 1

## 2022-05-05 MED ORDER — METFORMIN HCL ER 750 MG PO TB24
750.0000 mg | ORAL_TABLET | Freq: Two times a day (BID) | ORAL | Status: DC
Start: 2022-05-05 — End: 2022-05-06
  Administered 2022-05-05 – 2022-05-06 (×2): 750 mg via ORAL
  Filled 2022-05-05 (×2): qty 1

## 2022-05-05 NOTE — Plan of Care (Signed)
?  Problem: Consults ?Goal: RH STROKE PATIENT EDUCATION ?Description: See Patient Education module for education specifics  ?Outcome: Progressing ?  ?Problem: RH BOWEL ELIMINATION ?Goal: RH STG MANAGE BOWEL WITH ASSISTANCE ?Description: STG Manage Bowel with mod I Assistance. ?Outcome: Progressing ?Goal: RH STG MANAGE BOWEL W/MEDICATION W/ASSISTANCE ?Description: STG Manage Bowel with Medication with mod I Assistance. ?Outcome: Progressing ?  ?Problem: RH SAFETY ?Goal: RH STG ADHERE TO SAFETY PRECAUTIONS W/ASSISTANCE/DEVICE ?Description: STG Adhere to Safety Precautions With cues Assistance/Device. ?Outcome: Progressing ?  ?Problem: RH PAIN MANAGEMENT ?Goal: RH STG PAIN MANAGED AT OR BELOW PT'S PAIN GOAL ?Description: At or below level 4 with prns ?Outcome: Progressing ?  ?Problem: RH KNOWLEDGE DEFICIT ?Goal: RH STG INCREASE KNOWLEDGE OF DIABETES ?Description: Patient and son will be able to manage DM with medications and dietary modifications using handouts and educational resources independently ?Outcome: Progressing ?Goal: RH STG INCREASE KNOWLEDGE OF HYPERTENSION ?Description: Patient and son will be able to manage HTN with medications and dietary modifications using handouts and educational resources independently ?Outcome: Progressing ?Goal: RH STG INCREASE KNOWLEGDE OF HYPERLIPIDEMIA ?Description: Patient and son will be able to manage HDL with medications and dietary modifications using handouts and educational resources independently ?Outcome: Progressing ?Goal: RH STG INCREASE KNOWLEDGE OF STROKE PROPHYLAXIS ?Description: Patient and son will be able to manage secondary stroke risks with medications and dietary modifications using handouts and educational resources independently ?Outcome: Progressing ?  ?Problem: RH Vision ?Goal: RH LTG Vision (Specify) ?Outcome: Progressing ?  ?

## 2022-05-05 NOTE — Progress Notes (Signed)
Pt requested dirty/soiled clothing be laundered. Rn placed linens in washing machine w/ labeled possessions bag on top to identify patient room number and last name.  ?

## 2022-05-05 NOTE — Progress Notes (Signed)
PROGRESS NOTE   Subjective/Complaints: Has concerns regarding her diet- noted to still have a comment about no liquid milk products- I have removed this. Grapefruit allergy also removed.   ROS: Positive for LLE pain and numbness which is not new ongoing likely from diabetic neuropathy.  This is mostly in her toes of her left foot, +urinary urgency, +anxiety  Objective:   No results found. Recent Labs    05/04/22 0746  WBC 7.3  HGB 9.6*  HCT 31.4*  PLT 389   Recent Labs    05/04/22 0746  NA 136  K 4.0  CL 104  CO2 22  GLUCOSE 216*  BUN 21*  CREATININE 1.35*  CALCIUM 8.3*     Intake/Output Summary (Last 24 hours) at 05/05/2022 1112 Last data filed at 05/05/2022 1000 Gross per 24 hour  Intake 400 ml  Output --  Net 400 ml        Physical Exam: Vital Signs Blood pressure (!) 160/97, pulse (!) 110, temperature 98.2 F (36.8 C), temperature source Oral, resp. rate 20, height 5\' 2"  (1.575 m), weight 90.9 kg, SpO2 98 %. Gen: no distress, normal appearing, BMI 36.65 HEENT: oral mucosa pink and moist, NCAT Cardio: Tachycardia Chest: normal effort, normal rate of breathing Abd: soft, non-distended, +BS all quadrants Ext: no edema Psych: depressed mood.  Skin: intact Neuro:  alert and oriented, No aphasia.  Cranial nerves II through XII intact other than decreased facial sensation on the left side can perform finger-to-nose but slow to initiate decree sensation left second toe through fifth toes able to follow one-step commands.  Fluent language but some delayed responses. Musculoskeletal: Tone is normal no joint swelling noted.  IV in both arms 5/5 in RUE and RLE 4-4+/5 in LLE.  Finger flexion L UE at least 4/5 no shoulder abduction noted appears not limited by pain or effort.  Able to hold elbow flexed against gravity.  Minimal active elbow extension appears to be able to hold arm extended when sitting Limited  testing due to limited effort or pain during examination.  Tenderness throughout proximal LUE and pain with passive ROM at elbow and shoulder. Physical exam unchanged from 05/02/22  Assessment/Plan: 1. Functional deficits which require 3+ hours per day of interdisciplinary therapy in a comprehensive inpatient rehab setting. Physiatrist is providing close team supervision and 24 hour management of active medical problems listed below. Physiatrist and rehab team continue to assess barriers to discharge/monitor patient progress toward functional and medical goals  Care Tool:  Bathing    Body parts bathed by patient: Right arm, Chest, Left arm, Abdomen, Front perineal area, Buttocks, Right upper leg, Left upper leg, Right lower leg, Left lower leg, Face         Bathing assist Assist Level: Set up assist     Upper Body Dressing/Undressing Upper body dressing   What is the patient wearing?: Bra, Dress    Upper body assist Assist Level: Minimal Assistance - Patient > 75%    Lower Body Dressing/Undressing Lower body dressing      What is the patient wearing?: Underwear/pull up     Lower body assist Assist for lower body dressing: Supervision/Verbal  cueing     Toileting Toileting    Toileting assist Assist for toileting: Minimal Assistance - Patient > 75% Assistive Device Comment: walker   Transfers Chair/bed transfer  Transfers assist     Chair/bed transfer assist level: Minimal Assistance - Patient > 75%     Locomotion Ambulation   Ambulation assist      Assist level: Minimal Assistance - Patient > 75% Assistive device: No Device Max distance: 20'   Walk 10 feet activity   Assist     Assist level: Minimal Assistance - Patient > 75% Assistive device: No Device   Walk 50 feet activity   Assist Walk 50 feet with 2 turns activity did not occur: Safety/medical concerns (decreased endurnace without device)         Walk 150 feet activity   Assist  Walk 150 feet activity did not occur: Safety/medical concerns         Walk 10 feet on uneven surface  activity   Assist     Assist level: Minimal Assistance - Patient > 75%     Wheelchair     Assist Is the patient using a wheelchair?: Yes (for energy conservation/time management but will not use at d/c anticipated) Type of Wheelchair: Manual    Wheelchair assist level: Dependent - Patient 0%      Wheelchair 50 feet with 2 turns activity    Assist        Assist Level: Dependent - Patient 0%   Wheelchair 150 feet activity     Assist      Assist Level: Dependent - Patient 0%   Blood pressure (!) 160/97, pulse (!) 110, temperature 98.2 F (36.8 C), temperature source Oral, resp. rate 20, height 5\' 2"  (1.575 m), weight 90.9 kg, SpO2 98 %.  Medical Problem List and Plan: 1. Functional deficits secondary to right pontine infarct and punctate left hippocampus and frontal white matter infarcts. Neurology suspects cardioembolic vs small vessel disease source. 30 Day cardionet monitoring to rule out afib             -patient may shower             -ELOS/Goals: 12-14 days Sup to Mod I with PT/OT/SLP  Continue CIR 2.  Antithrombotics: -DVT/anticoagulation:  Pharmaceutical: Heparin             -antiplatelet therapy: Plavix. Allergy to aspirin 5/13 Plavix alone due to aspirin allergy. 3. Pain Management: Tylenol as needed -Chronic pain syndrome on Xtampza not restarted on admission -continue Lyrica 50 mg daily -restarted Norco 5mg  TID prn 5/13 continue pain management regimen: Lyrica, Norco, Xtampza 5/14 Pain controlled with current regimen. 4. Anxiety: LCSW to evaluate and provide emotional support             -antipsychotic agents: n/a             -anxiety: continue home Xanax 2mg  BID as needed.               -patient reported history of bipolar disorder: continue trazodone 150 mg q HS.  She is agreeable to neuropsychiatric evaluation Add magnesium  gluconate 250mg  HS 5. Decreased safety awareness: Telesitter is ordered. This patient is capable of making decisions on her own behalf. 6. Skin/Wound Care: Routine skin care checks 7. Fluids/Electrolytes/Nutrition: Routine I's and O's and follow-up chemistries 8: Hypertension: monitor TID/prn            --continue Lopressor 50 mg BID           -  BP overall has been well controlled 5/13 blood pressure systolic has been mostly in the 140s we will continue to trend May consider increasing Lopressor dose. 5/14 Trial of increased metoprolol dose of 100 mg BID from 50 mg BID, BP 157/92, HR 110.  Home lorsartan 25 mg has not been given inpatient, with slightly elevated Crt 1.41. Add magnesium gluconate 250mg  HS     05/05/2022    9:00 AM 05/05/2022    2:47 AM 05/04/2022    8:07 PM  Vitals with BMI  Height 5\' 2"     Weight 200 lbs 6 oz    BMI 36.64    Systolic  160 143  Diastolic  97 103  Pulse  110 110    9: Chronic systolic heart failure: no diuretics            -Continue metoprolol/losartan/farxiga, consider Entresto if BP tolerates -5/14 This admission pt has been given metoprolol and Farxiga but not losartan (Crt 1.41). Continue to trend Crt with qMonday labs before starting ARB.  10: DM-2: monitor CBGs, carb modified diet             --continue Farxiga 5 mg daily             --continue metformin 500 mg BID             --continue SSI 0-15u             -CBGs in 200s-260s, consider increase metformin if remains elevated, discussed diet 5/13 patient has been very noncompliant with her diet with note from nurse that she ate 2 trays at lunch and 2 trays at dinner of very high sugar and very high carbohydrate rate content.  She also had 2 spikes of elevated CBGs at 335 on 5/12, and 411 on 4/11.  She takes metformin 500 mg twice daily at home but on inspection it does not look like she has been taking this in the hospital.  Spoke to pharmacy today and 7/12.D. recommended resuming her at home  dose of metformin in addition to 6/11 10 mg daily.  We will see if her blood sugars equilibrate at a lower level with these changes. Increase metformin to 750mg  BID  CBG (last 3)  Recent Labs    05/04/22 1700 05/04/22 2024 05/05/22 0604  GLUCAP 135* 220* 199*    11: CKD 3b: Elevated BUN and creatinine.  Encourage Erin Jackson.o. fluids and follow-up BMP. 5/13 BUN has trended down some from 22-15, creatinine has trended down from 1.65-1.41 continue to encourage Erin Jackson.o. intake and will continue monitoring BMP. 5/14 Pending qMonday labs.    Latest Ref Rng & Units 05/04/2022    7:46 AM 04/30/2022    5:29 AM 04/27/2022   12:29 AM  BMP  Glucose 70 - 99 mg/dL 05/06/2022   06/30/2022   06/27/2022    BUN 6 - 20 mg/dL 21   15   22     Creatinine 0.44 - 1.00 mg/dL 973   532   992    Sodium 135 - 145 mmol/L 136   138   134    Potassium 3.5 - 5.1 mmol/L 4.0   4.2   4.7    Chloride 98 - 111 mmol/L 104   106   103    CO2 22 - 32 mmol/L 22   24   21     Calcium 8.9 - 10.3 mg/dL 8.3   8.4   8.9      12: CAD: left heart  cath on 04/09/2022 Dr. Algie Coffer; no significant stenosis. Discussed healthy diet 13: Hyperlipidemia: continue Lipitor 80 mg daily, Zetia 10 mg daily 14: PAD: seen in hospital consultation by Dr. Lenell Antu 04/09/2022: follow-up at discharge for abnormal left ABI             --continue Plavix and statin 15: Tobacco use: Recently stopped and is not having cravings 16: Obesity: dietary counseling 17: Left breat mass: s/Erin Jackson lumpectomy 03/25/2022>>non-malignant.  Incision healing. 18: Food allergies: citrus/cucumber/apple juice/tree nuts,peanuts/pickles 19: Lactose intolerant: avoid dairy 20: GERD: Continue Protonix 21. Vitamin D deficiency: start ergocalciferol 50,000U once per week for 7 weeks. 22. Anemia: Hgb stable, monitor weekly 5/13 continue to monitor with weekly labs currently stable.    Latest Ref Rng & Units 05/04/2022    7:46 AM 05/01/2022    5:50 AM 04/30/2022    5:29 AM  CBC  WBC 4.0 - 10.5 K/uL 7.3    8.6     Hemoglobin 12.0 - 15.0 g/dL 9.6   9.1   9.0    Hematocrit 36.0 - 46.0 % 31.4   28.5   27.9    Platelets 150 - 400 K/uL 389    308      23. Insomnia: continue trazodone  HS 5/13 Currently sleep pattern is stable. 23. Urinary urgency: ordered bladder program 24. History of bipolar disorder: have placed on neuropsych schedule  LOS: 6 days A FACE TO FACE EVALUATION WAS PERFORMED  Erin Jackson Erin Jackson Erin Jackson 05/05/2022, 11:12 AM

## 2022-05-05 NOTE — Progress Notes (Addendum)
Speech Language Pathology Daily Session Note ? ?Patient Details  ?Name: Erin Jackson ?MRN: [id number] ?Date of Birth: 1965-12-31 ? ?Today's Date: 05/05/2022 ?SLP Individual Time: 0902-1000 ?SLP Individual Time Calculation (min): 58 min ? ?Short Term Goals: ?Week 1: SLP Short Term Goal 1 (Week 1): STGs=LTGs due to ELOS ? ?Skilled Therapeutic Interventions: ?Pt seen this date for skilled ST intervention targeting cognitive-linguistic goals outlined in care plan. Pt received awake/alert and sitting on the EOB; flat affect persists. Transferred from EOB to toilet with Min A + RW; Mod A for management of RW. Despite re-education and verbal reminders re: safety precautions once on the toliet, pt continues to disregard safety measures as evident by standing and ambulating after tolieting without assistance. Agreeable to ST intervention. ? ?SLP faciliated today's session by providing the following skilled interventions within the context of functional and therapeutic tx tasks: corrective feedback, Min-Mod A for error awareness, compensatory strategy training, supportive listening, ST POC, rationale for CIR admission, and positive reinforcement.  ? ?Specifically, pt completed functional problem-solving task re: money management from ALFA with 90% accuracy and interpreting Rx label subtest with 80% accuracy; Min A for organizing pills accurately in medication chart. Calculated the price of 4 different food items with 100% accuracy given Min-Mod A for error awareness and Mod A for use of compensatory executive functioning strategies to include writing and talking aloud to aid in organization and thought processing. Pt reports continued depression and frustration with her current situation/status. Upon inquiry, states she would appreciate a visit from the chaplain; CSW made aware. Spoke with pt re: mindset and utilization of positive thinking and affirmations to help with her participation in therapy. Pt appreciative of care.  Returned to room and provided Min A + RW for ambulation to the bathroom.  ? ?Of note, pt continues to report her cognition is not consistent with baseline; however, this SLP suspects she may be approaching her baseline and that her current depression and anxiety is likely limiting her cognitive abilities vs CVA.  ? ?Direct hand off to NT for tolieting needs. Continue per current ST POC. ? ?Pain ?No pain reported this session. ? ?Therapy/Group: Individual Therapy ? ?Efrat Zuidema A Adonica Fukushima ?05/05/2022, 12:28 PM  ?

## 2022-05-05 NOTE — Progress Notes (Signed)
Patient ordered dinner last night from outside.Patient would like to talk to MD regarding her diet.     ?

## 2022-05-05 NOTE — Progress Notes (Signed)
Physical Therapy Session Note ? ?Patient Details  ?Name: Erin Jackson ?MRN: [id number] ?Date of Birth: 03/13/1966 ? ?Today's Date: 05/05/2022 ?PT Individual Time: 5397-6734 ?PT Individual Time Calculation (min): 40 min  ? ?Short Term Goals: ?Week 1:  PT Short Term Goal 1 (Week 1): = LTGs ? ?Skilled Therapeutic Interventions/Progress Updates:  ?   ?Patient sleeping in bed with the lights off upon PT arrival. Patient slow to arouse and agreeable to PT session. Patient reported 6-7/10 L side pain during session, LPN made aware and provided pain medication at beginning of session. PT provided repositioning, rest breaks, and distraction as pain interventions throughout session.  ? ?PT requested patient to have meclizine prior to vestibular assessment to improve tolerance with previous testing, LPN provided medication prior to testing.  ? ?Focused session on vestibular re-assessment for intermittent dizziness/imbalance with mobility. Assessment and history completed below. Patient agreeable to assessment and testing performed. Patient agreeable to trial R Gilberto Better positioning today with the bed in trendelenburg and a pillow under her thoracic spine to accommodate ROM restrictions. Patient did not tolerate testing due to severity of symptoms. Patient requested to "never" have this test performed again due to severity of symptoms and "feeling of falling." Testing was terminated upon patient's request. Educated patient on findings and recommendations. Patient stated understanding and appreciative.  ?  ?Findings: Patient presents with symptoms consistent with oculomotor dysfunction and possible motion sensitivity vs BPPV. ?  ?Recommendations:  ?-Medical management of symptoms of dizziness and headache prior to further testing or treatment due to severity of symptoms ?-Continued use of sunglasses for photophobia and motion sensitivity with mobility, encourage patient not to wear sunglasses at rest as tolerated to increase  tolerance. ?-Initiate visual accomodation and VOR exercises in sitting, see HEP below, in a quiet environment and progress as tolerated.  ?  ?Therapeutic Activity: ?Bed Mobility: Patient performed supine to/from sit with min A in a flat bed without use of bed rails. Provided verbal cues for progressing through side-lying and use of bottom elbow for trunk control. ?Transfers: Patient performed sit to/from stand x1 with CGA-min A without an AD. Provided verbal cues for forward weight shift and erect posture. ?  ? Vestibular Assessment - 05/05/22 1315   ? ?  ? Symptom Behavior  ? Subjective history of current problem Patient with pontine infarct with primary symptoms of dizziness/imbalance with falls x4, no LOC, unknown head trauma   ? Type of Dizziness  Blurred vision;Imbalance;Unsteady with head/body turns;Lightheadedness;"World moves"   ? Frequency of Dizziness intermittent   ? Duration of Dizziness >60 sec   ? Symptom Nature Motion provoked;Positional;Spontaneous;Intermittent   ? Aggravating Factors Activity in general;Moving eyes;Spontaneous onset;Looking up to the ceiling;Sit to stand   ? Relieving Factors Head stationary;Dark room;Closing eyes;Slow movements;Rest   ? Progression of Symptoms Better   ? History of similar episodes none   ?  ? Oculomotor Exam  ? Oculomotor Alignment Normal   ? Ocular ROM unable to tolerate superior ROM bilaterally   ? Spontaneous Absent   ? Gaze-induced  Absent   ? Head shaking Horizontal Comment   unable to tolerate, eyes closed  ? Head Shaking Vertical Comment   unable to tolerate, eyes closed  ? Smooth Pursuits Saccades   ? Saccades Poor trajectory;Slow   ? Comment very limited by symptoms of dizziness and eye strain with headache   ?  ? Positional Testing  ? Dix-Hallpike Dix-Hallpike Right   ?  ? Dix-Hallpike Right  ? Dix-Hallpike  Right Duration 45-60 sec   ? Dix-Hallpike Right Symptoms No nystagmus   significant imbalance with patient moving and stating "I'm falling" patient  with poor tolerance and asked to terminate testing due to severity of symptoms; resolved <60 sec and returned with sit up from side-lying <60 sec  ? ?  ?  ? ?  ? ?Therapeutic Exercise: ?Patient declined performing the following exercises provided with HEP handout due to feeling poorly following assessment above. Reviewed purpose and technique of exercises with verbal instruction and demonstration, patient agreeable to initiate exercises tomorrow with therapy.  ? ?Access Code: [email or web address] ?- Seated Horizontal Smooth Pursuit  - 3 x daily - 7 x weekly - 1 sets - 3 reps - 30-60 sec hold ?- Seated Horizontal Saccades  - 3 x daily - 7 x weekly - 1 sets - 3 reps - 30-60 sec hold ?- Seated Vertical Saccades  - 3 x daily - 7 x weekly - 1 sets - 3 reps - 30-60 sec hold ?- Seated Gaze Stabilization with Head Rotation  - 3 x daily - 7 x weekly - 1 sets - 3 reps - 30-60 sec hold ? ?Patient in bed at end of session with breaks locked, bed alarm set, and all needs within reach.  ? ?Therapy Documentation ?Precautions:  ?Precautions ?Precautions: Fall ?Precaution Comments: per Ralene Bathe, PA, no limitations to L shoulder s/p rotator cuff repair in March ?Restrictions ?Weight Bearing Restrictions: No ?General: ?PT Amount of Missed Time (min): 20 Minutes ?PT Missed Treatment Reason: Other (Comment);Patient ill (Comment) (patient symptomatic following BPPV testing, did not tolerate further mobility) ? ? ? ?Therapy/Group: Individual Therapy ? ?Helayne Seminole PT, DPT ? ?05/05/2022, 4:50 PM  ?

## 2022-05-05 NOTE — Progress Notes (Signed)
Occupational Therapy Session Note ? ?Patient Details  ?Name: Erin Jackson ?MRN: [id number] ?Date of Birth: [birth year] ? ?Today's Date: 05/05/2022 ?OT Individual Time: 1000-1110 ?OT Individual Time Calculation (min): 70 min  ? ? ?Short Term Goals: ?Week 1:  OT Short Term Goal 1 (Week 1): Patient will ambulate to bathroom with supervision using RW ?OT Short Term Goal 2 (Week 1): Patient wil demonstrate at least 60 degrees of left shoulder flexion (low to mid level reach) without increase in pain in left arm ?OT Short Term Goal 3 (Week 1): Patient will dress her upper body with no more than intermittent min assistance and increased time ?OT Short Term Goal 4 (Week 1): Patient will dress her lower body with no more than intermittent min assistance and increased time ?OT Short Term Goal 5 (Week 1): Patient will complete tub/shower transfer using tub/transfer bench and supervision/cueing ? ?Skilled Therapeutic Interventions/Progress Updates:  ?  Subjective: Pt states left shoulder feels "so sore" and requesting heating pad to manage pain.  Pt already received prescribed pain medication one hour prior to OT session.  ?  Objective:  Pt sitting EOB.  Stand pivot to w/c using RW with supervision.  Pt transported to large gym and moist heating pad applied x 5 minutes while OT and patient discussed plans for current session.  Stand pivot to EOM with supervision using RW.  Sit to supine with min assist.  Prolonged, gentle PROM completed left shoulder in all planes.  Achieved ~150 degrees flexion and abduction today-significant improvements; however took time to reduce pt guarding in order to achieve.  Full ER and IR noted.  Pt completed AAROM chest press then slight overhead flexion using weightless dowel x 10 reps.  AAROM ER supine gravity assisted x 10 reps.  Pt took significantly extended time to complete each exercise with max encouragement to participate therefore limited progress on repetitions completed.  Call bell in  reach, seat alarm on.   ?  Assessment:  Pt making progress evidenced by improved PROM to left shoulder in flexion and abduction.  Primary barriers today included pt guarding left shoulder, pain level, and poor motivation to progress. ?  Plan: Pt would benefit from further training on progressive AAROM/AROM strengthening of left shoulder.  ? ?Therapy Documentation ?Precautions:  ?Precautions ?Precautions: Fall ?Precaution Comments: per Ralene Bathe, PA, no limitations to L shoulder s/p rotator cuff repair in March ?Restrictions ?Weight Bearing Restrictions: No ? ? ? ?Therapy/Group: Individual Therapy ? ?Erin Jackson ?05/05/2022, 12:43 PM ?

## 2022-05-06 LAB — GLUCOSE, CAPILLARY
Glucose-Capillary: 157 mg/dL — ABNORMAL HIGH (ref 70–99)
Glucose-Capillary: 166 mg/dL — ABNORMAL HIGH (ref 70–99)
Glucose-Capillary: 116 mg/dL — ABNORMAL HIGH (ref 70–99)
Glucose-Capillary: 229 mg/dL — ABNORMAL HIGH (ref 70–99)

## 2022-05-06 LAB — MAGNESIUM: Magnesium: 0.6 mg/dL — CL (ref 1.7–2.4)

## 2022-05-06 MED ORDER — HYDROCODONE-ACETAMINOPHEN 5-325 MG PO TABS
2.0000 | ORAL_TABLET | Freq: Three times a day (TID) | ORAL | Status: DC | PRN
  Administered 2022-05-06 – 2022-05-07 (×2): 2 via ORAL
  Filled 2022-05-06 (×2): qty 2

## 2022-05-06 MED ORDER — METFORMIN HCL ER 500 MG PO TB24
1000.0000 mg | ORAL_TABLET | Freq: Two times a day (BID) | ORAL | Status: DC
  Administered 2022-05-06 – 2022-05-08 (×4): 1000 mg via ORAL
  Filled 2022-05-06 (×4): qty 2

## 2022-05-06 MED ORDER — CLONIDINE HCL 0.1 MG PO TABS
0.1000 mg | ORAL_TABLET | Freq: Two times a day (BID) | ORAL | Status: DC
  Administered 2022-05-06 – 2022-05-08 (×5): 0.1 mg via ORAL
  Filled 2022-05-06 (×5): qty 1

## 2022-05-06 MED ORDER — MAGNESIUM GLUCONATE 500 MG PO TABS
500.0000 mg | ORAL_TABLET | Freq: Two times a day (BID) | ORAL | Status: AC
  Administered 2022-05-06 – 2022-05-07 (×4): 500 mg via ORAL
  Filled 2022-05-06 (×4): qty 1

## 2022-05-06 MED ORDER — MAGNESIUM GLUCONATE 500 MG PO TABS: 250.0000 mg | ORAL_TABLET | Freq: Every day | ORAL | Status: DC

## 2022-05-06 NOTE — Progress Notes (Addendum)
Patient ID: Erin Jackson, female   DOB: [birth year], 56 y.o.   MRN: [id number]  Met with pt and had son-George on speaker phone to discuss team conference goals of supervision level and discharge 5/19. He reports he is working on getting her to [place] and could come on Sat. Asked pt if son local would pick her up and make sure she got into their apartment. Pt reported he blocked her today and does not want to be bothered. She is connected with medicaid transport, but does not make sure she gets up the two stairs and into her apartment. Have reached out to team to see if possible to go home on Sat instead of Friday. ? ?1:40 PM MD and team feel this would be a better discharge disposition for pt to go home with Erin Jackson. ?

## 2022-05-06 NOTE — Discharge Instructions (Addendum)
Inpatient Rehab Discharge Instructions  BERTA DENSON Discharge date and time: 05/08/2022  Activities/Precautions/ Functional Status:  Activity: no lifting, driving, or strenuous exercise until cleared by MD Diet: diabetic diet Wound Care: none needed Functional status:  ___ No restrictions     ___ Walk up steps independently ___ 24/7 supervision/assistance   ___ Walk up steps with assistance __x_ Intermittent supervision/assistance  ___ Bathe/dress independently ___ Walk with walker     __x_ Bathe/dress with assistance ___ Walk Independently    ___ Shower independently ___ Walk with assistance    _x__ Shower with assistance __x_ No alcohol     ___ Return to work/school ________  Special Instructions:  No driving, alcohol consumption or tobacco use.    COMMUNITY REFERRALS UPON DISCHARGE:    Home Health:   PT  & OT                Agency: Phone:   Medical Equipment/Items Ordered: Levan Hurst [employer]                                                 Agency/Supplier:[facility]   [phone]  GENERAL COMMUNITY RESOURCES FOR PATIENT/FAMILY: Mental Health:RESOURCES FOR ASSIST-[facility] [phone]                         [facility]-OUTPATIENT [phone]                          [facility] [place] [phone]  STROKE/TIA DISCHARGE INSTRUCTIONS SMOKING Cigarette smoking nearly doubles your risk of having a stroke & is the single most alterable risk factor  If you smoke or have smoked in the last 12 months, you are advised to quit smoking for your health. Most of the excess cardiovascular risk related to smoking disappears within a year of stopping. Ask you doctor about anti-smoking medications [place] Quit Line: 1-800-QUIT NOW Free Smoking Cessation Classes [phone]  CHOLESTEROL Know your levels; limit fat & cholesterol in your diet  Lipid Panel     Component Value Date/Time   CHOL 170 04/22/2022 0144    TRIG 225 (H) 04/22/2022 0144   HDL 23 (L) 04/22/2022 0144   CHOLHDL 7.4 04/22/2022 0144   VLDL 45 (H) 04/22/2022 0144   LDLCALC 102 (H) 04/22/2022 0144     Many patients benefit from treatment even if their cholesterol is at goal. Goal: Total Cholesterol (CHOL) less than 160 Goal:  Triglycerides (TRIG) less than 150 Goal:  HDL greater than 40 Goal:  LDL (LDLCALC) less than 100   BLOOD PRESSURE American Stroke Association blood pressure target is less that 120/80 mm/Hg  Your discharge blood pressure is:  BP: 130/90 Monitor your blood pressure Limit your salt and alcohol intake Many individuals will require more than one medication for high blood pressure  DIABETES (A1c is a blood sugar average for last 3 months) Goal HGBA1c is under 7% (HBGA1c is blood sugar average for last 3 months)  Diabetes: Diagnosis of diabetes:  Your A1c:6.8 %    Lab Results  Component Value Date   HGBA1C 6.8 (H) 04/22/2022    Your HGBA1c can be lowered with medications, healthy diet, and exercise. Check your blood sugar as directed by your physician Call your  physician if you experience unexplained or low blood sugars.  PHYSICAL ACTIVITY/REHABILITATION Goal is 30 minutes at least 4 days per week  Activity: Increase activity slowly, Therapies: Physical Therapy: Home Health Return to work: when cleared by MD Activity decreases your risk of heart attack and stroke and makes your heart stronger.  It helps control your weight and blood pressure; helps you relax and can improve your mood. Participate in a regular exercise program. Talk with your doctor about the best form of exercise for you (dancing, walking, swimming, cycling).  DIET/WEIGHT Goal is to maintain a healthy weight  Your discharge diet is:  Diet Order             Diet regular Room service appropriate? Yes; Fluid consistency: Thin  Diet effective now                  thin liquids Your height is:  Height: 5\' 2"  (157.5 cm) Your current  weight is: Weight: 90.9 kg Your Body Mass Index (BMI) is:  BMI (Calculated): 36.64 Following the type of diet specifically designed for you will help prevent another stroke. Your goal weight range is:   Your goal Body Mass Index (BMI) is 19-24. Healthy food habits can help reduce 3 risk factors for stroke:  High cholesterol, hypertension, and excess weight.  RESOURCES Stroke/Support Group:  Call [phone]   STROKE EDUCATION PROVIDED/REVIEWED AND GIVEN TO PATIENT Stroke warning signs and symptoms How to activate emergency medical system (call 911). Medications prescribed at discharge. Need for follow-up after discharge. Personal risk factors for stroke. Pneumonia vaccine given: No Flu vaccine given: No My questions have been answered, the writing is legible, and I understand these instructions.  I will adhere to these goals & educational materials that have been provided to me after my discharge from the hospital.       My questions have been answered and I understand these instructions. I will adhere to these goals and the provided educational materials after my discharge from the hospital.  Patient/Caregiver Signature _______________________________ Date __________  Clinician Signature _______________________________________ Date __________  Please bring this form and your medication list with you to all your follow-up doctor's appointments.

## 2022-05-06 NOTE — Progress Notes (Signed)
Occupational Therapy Session Note  Patient Details  Name: Erin Jackson MRN: [id number] Date of Birth: 27-Mar-1966  Today's Date: 05/06/2022 OT Individual Time: 3570-1779 OT Individual Time Calculation (min): 65 min    Short Term Goals: Week 1:  OT Short Term Goal 1 (Week 1): Patient will ambulate to bathroom with supervision using RW OT Short Term Goal 2 (Week 1): Patient wil demonstrate at least 60 degrees of left shoulder flexion (low to mid level reach) without increase in pain in left arm OT Short Term Goal 3 (Week 1): Patient will dress her upper body with no more than intermittent min assistance and increased time OT Short Term Goal 4 (Week 1): Patient will dress her lower body with no more than intermittent min assistance and increased time OT Short Term Goal 5 (Week 1): Patient will complete tub/shower transfer using tub/transfer bench and supervision/cueing  Skilled Therapeutic Interventions/Progress Updates:   Pt reports feeling like she needs to talk to someone regarding past trauma that is resurfacing during her hospital stay here.  Pt also reports concern about her discharge plans.  Pt standing in middle of room with nursing staff present without RW or gait belt donned.  Nursing reports patient has been getting up without requesting assist.  This therapist gently educated pt on importance of using RW secondary to pts experiencing dizziness and imbalance with changes in position.  Pt reports understanding and receptive to using RW at that time.  Donned gait belt and provided close supervision for stand pivot transfer sitting EOB. Pt reports wanting to take a quick shower during this session.  Pt ambulated to bathroom using RW at first but when entering bathroom pt pushing to side and attempting to move around device.  Therapist decided to remove RW due to inconsistent follow through and pt utilized grab bars intermittently with supervision.  After pt setup seated on shower bench, pt  completed UB dressing and bathing with distant supervision (curtain closed and therapist close by) per pt request to facilitate pt dignity and privacy.  Pt had no overt safety issues but thoroughness not able to assess.  Pt ambulated to EOB and expressed to therapist feelings of distrust in others including her son due to past childhood and marital experiences. Pt expressed recall of these past events to therapist.  This therapist provided therapeutic use of self to listen and validate pt.  Educated pt on benefits of counseling to further process these emotions and learn coping strategies. Pt receptive and requesting resources for this. Made interdisciplinary team aware at conference and pts request for resources.  Pt completed stand pivot to recliner with supervision .  Call bell in reach, nurse tech made aware the seat alarm not donned and requested tech to donn.       Therapy Documentation Precautions:  Precautions Precautions: Fall Precaution Comments: per Ralene Bathe, PA, no limitations to L shoulder s/p rotator cuff repair in March Restrictions Weight Bearing Restrictions: No    Therapy/Group: Individual Therapy  Amie Critchley 05/06/2022, 12:10 PM

## 2022-05-06 NOTE — Plan of Care (Signed)
?  Problem: Consults ?Goal: RH STROKE PATIENT EDUCATION ?Description: See Patient Education module for education specifics  ?Outcome: Progressing ?  ?Problem: RH BOWEL ELIMINATION ?Goal: RH STG MANAGE BOWEL WITH ASSISTANCE ?Description: STG Manage Bowel with mod I Assistance. ?Outcome: Progressing ?Goal: RH STG MANAGE BOWEL W/MEDICATION W/ASSISTANCE ?Description: STG Manage Bowel with Medication with mod I Assistance. ?Outcome: Progressing ?  ?Problem: RH SAFETY ?Goal: RH STG ADHERE TO SAFETY PRECAUTIONS W/ASSISTANCE/DEVICE ?Description: STG Adhere to Safety Precautions With cues Assistance/Device. ?Outcome: Progressing ?  ?Problem: RH PAIN MANAGEMENT ?Goal: RH STG PAIN MANAGED AT OR BELOW PT'S PAIN GOAL ?Description: At or below level 4 with prns ?Outcome: Progressing ?  ?Problem: RH KNOWLEDGE DEFICIT ?Goal: RH STG INCREASE KNOWLEDGE OF DIABETES ?Description: Patient and son will be able to manage DM with medications and dietary modifications using handouts and educational resources independently ?Outcome: Progressing ?Goal: RH STG INCREASE KNOWLEDGE OF HYPERTENSION ?Description: Patient and son will be able to manage HTN with medications and dietary modifications using handouts and educational resources independently ?Outcome: Progressing ?Goal: RH STG INCREASE KNOWLEGDE OF HYPERLIPIDEMIA ?Description: Patient and son will be able to manage HDL with medications and dietary modifications using handouts and educational resources independently ?Outcome: Progressing ?Goal: RH STG INCREASE KNOWLEDGE OF STROKE PROPHYLAXIS ?Description: Patient and son will be able to manage secondary stroke risks with medications and dietary modifications using handouts and educational resources independently ?Outcome: Progressing ?  ?Problem: RH Vision ?Goal: RH LTG Vision (Specify) ?Outcome: Progressing ?  ?

## 2022-05-06 NOTE — Patient Care Conference (Signed)
Inpatient RehabilitationTeam Conference and Plan of Care Update ?Date: 05/06/2022   Time: 11:10 AM  ? ? ?Patient Name: Erin Jackson      ?Medical Record Number: [id number]  ?Date of Birth: [birth year] ?Sex: Female         ?Room/Bed: [id number] ?Payor Info: Payor: [employer] / Plan: [facility] MEDICARE / Product Type: *No Product type* /   ? ?Admit Date/Time:  04/29/2022  4:03 PM ? ?Primary Diagnosis:  Embolic stroke ([place]) ? ?Hospital Problems: Principal Problem: ?  Embolic stroke ([place]) ?Active Problems: ?  Primary hypertension ?  Stage 3b chronic kidney disease ([place]) ?  Chronic systolic heart failure ([place]) ? ? ? ?Expected Discharge Date: Expected Discharge Date: 05/09/22 ? ?Team Members Present: ?Physician leading conference: Dr. Leeroy Cha ?Social Worker Present: Ovidio Kin, LCSW ?Nurse Present: Dorien Chihuahua, RN ?PT Present: Ginnie Smart, PT ?OT Present: Leretha Pol, OT ?SLP Present: Helaine Chess, SLP ?PPS Coordinator present : Gunnar Fusi, SLP ? ?   Current Status/Progress Goal Weekly Team Focus  ?Bowel/Bladder ? ? pt cont of b and b lbm 05/05/22  remain cont of ba and b  assess q shift and prn   ?Swallow/Nutrition/ Hydration ? ? Regular/thin  N/A  N/A   ?ADL's ? ? distant supervision for toileting and bathing using shower bench; setup UB/LB dressing; close supervision ambulation to toilet using RW; poor carryover of RW use; limited tolerance to AAROM  mod I; will need to downgrade to supervision  self care training, functional transfers, safey awareness, left shoulder P/AAROM   ?Mobility ? ? mod I bed mobility, mod I sit<>stand transfers to RW, supervision short distance gait. Self limiting, presenting with behavioral and psychosomatic weakness? inconsistencies noted with strength vs functional ability.  supervision  activity tolerance, gait training, safety awareness, DC planning, pt ed   ?Communication ? ? N/A  N/A  N/A   ?Safety/Cognition/ Behavioral Observations ? Min to Mod A for  recall and problem-solving  Mod I for complex problem-solving for daily siutations, Sup A for recall and divided attention  Completion of iADL's and use of compensatory strategies   ?Pain ? ? no current c/o pain         ?Skin ? ? no skin break down or infection, bruising to abdomen and arms from lovenox         ? ? ?Discharge Planning:  ?HOme with son who may provide intermittent assist-works third and sleeps during the day. Another son-George aware prior to coming would need supervision. Neuro-psych has seen   ?Team Discussion: ?Patient with DM; elevated CBG, MD adjusted meds/dietary modifications reviewed for CMM diet. MD added home meds. Progress limited by anxiety; dealing with family dynamics, declined antidepressant.  Progress also limited by RTC; trouble with pain and using a walker, cannot use a platform due to shoulder.  ? ?Patient on target to meet rehab goals: ?Progress limited by inconsistencies. Goals for discharge set for supervision overall. ? ?*See Care Plan and progress notes for long and short-term goals.  ? ?Revisions to Treatment Plan:  ?Vestibular eval; recommend 24/7 supervision due to dizziness and potential loss of balance ?  ?Teaching Needs: ?Safety,medications, dietary modifications, etc. ?  ?Current Barriers to Discharge: ?Home enviroment access/layout and Lack of/limited family support and behavior ? ?Possible Resolutions to Barriers: ?Family education ?SW to provide list of resources for counseling  ?Given information on locating a new PCP ?  ? ? Medical Summary ?Current Status: rotator cuff tear, depression, anxiety, obesity, type  2 DM with hyperglycemia, stage 3b CKD ? Barriers to Discharge: Medical stability;Behavior;Weight ? Barriers to Discharge Comments: rotator cuff tear, depression, anxiety, obesity, type 2 DM with hyperglycemia, stage 3b CKD ?Possible Resolutions to [employer]: provide dietary education, restarted home Xanax, restarted home hydrocodone, neuropsych  eval, increase metformin, monitor creatinine while increasing metformin ? ? ?Continued Need for Acute Rehabilitation Level of Care: The patient requires daily medical management by a physician with specialized training in physical medicine and rehabilitation for the following reasons: ?Direction of a multidisciplinary physical rehabilitation program to maximize functional independence : Yes ?Medical management of patient stability for increased activity during participation in an intensive rehabilitation regime.: Yes ?Analysis of laboratory values and/or radiology reports with any subsequent need for medication adjustment and/or medical intervention. : Yes ? ? ?I attest that I was present, lead the team conference, and concur with the assessment and plan of the team. ? ? ?Dorien Chihuahua B ?05/06/2022, 4:57 PM  ? ? ? ? ? ? ?

## 2022-05-06 NOTE — Progress Notes (Addendum)
730: Rn entered room to administer morning meds, norco and benadryl. Pt in restroom unsupervised so Rn stepped into bathroom to observe pt and scan armband and pt became upset stating her privacy was being invaded. Pt refused RW and gait belt. Continued to express agitation that we did not take her feelings into consideration. Rn apologized for any inconveniences ? ?1138: Rn confirmed patient currently being toileted by [place].  ? ?1239: Rn entered room for routine med administration, pt requested alprazolam and robaxin, both given along w/ clonidine, IM insulin, and magnesium. Reviewed pts medication schedule w/ pt so she is aware of when norco and alprazolam may be taken again. Pt acknowledged understanding of the schedule.  ? ?1829: Rn in room giving meds and toileting patient. Pt again refusing use of gait belt and rolling walker.  ?

## 2022-05-06 NOTE — Progress Notes (Signed)
Physical Therapy Session Note ? ?Patient Details  ?Name: Erin Jackson ?MRN: [id number] ?Date of Birth: [birth year] ? ?Today's Date: 05/06/2022 ?PT Individual Time: 1130-1155 + [id number] ?PT Individual Time Calculation (min): 25 min  + 71 min ? ?Short Term Goals: ?Week 1:  PT Short Term Goal 1 (Week 1): = LTGs ? ?Skilled Therapeutic Interventions/Progress Updates:  ?   ? ?1st session: ?Pt sitting in recliner talking on the phone to start - agreeable to PT tx. Denies pain. Requests to toilet prior to leaving her room. Sit<>stand to RW mod I. Ambulates with supervision in her room to bathroom - continent of bladder and charted. Able to complete 3/3 toileting tasks without assist. Transported in w/c to main rehab gym for time management. Focused remainder of session on stair training to prepare for upcoming DC home. Sit<>stand from w/c without assist and no AD. Stair training completed with 6inch stairs and CGA for safety. She navigated x4 using 1 hand rail via self selected side stepping method and then x4 using no hand rails with side stepping. CGA for both. Returned to room in w/c for time. Sit<>stand with supervision and no AD - ambulated within her room with supervision and no AD (furniture walking, reaching for bed for balance). Concluded session seated in recliner, chair alarm on, all needs within reach.  ? ?2nd session: ?Pt resting in recliner to start - awakens to voice and agreeable to PT tx. Denies pain but reports fatigue from recent Rx. Sit<>Stand to RW with distant supervision. Ambulates with RW and distant supervision to bathroom. Pt continent of bladder and able to complete all toileting tasks without assist. Hand hygiene completed sinkside while standing with distant supervision. Transported in w/c to ortho rehab gym for energy conservation and time management. Completed stand<>pivot transfer to Nustep with supervision and no AD. SetupA provided on Nustep - completed a total of 6.5 minutes with rest  breaks every 2 minutes due to fatigue. She primarily used LE to target strengthening and cardiovascular endurance. Pt then assisted to w/c with supervision stand<>pivot transfer. Completed UE ergometer while seated in w/c due to BLE fatigue from prior activity. Resistance set to 1 - pt only able to use her RUE due to premorbid L RTC injury - activity completed for 7 minutes with rest breaks every 3 minutes. Ambulatory car transfer completed with distant supervision - car height set to simulate standard sedan. Pt completing this via side stepping method without evidence of imbalance or knee buckling. Then worked on [employer] on mat table - sit>supine mod I with pillows for comfort. ?-1x15 heel slides bilaterally ?-1x15 SLR bilaterally ?-1x15 glut sets bilaterally ?-1x15 hip abd bilaterally ?Supine to sitting EOM mod I with extra time. Ambulated ~86f [facility] Completed seated BITS activity to target cognitive processing and memory with visual/verbal testing - able to achieve up to 6 word memory prior to errors. Returned to her room in w/c and completed ambulatory transfer with supervision and RW to her bed. Remained seated EOB at end of session with bed alarm on, call bell in reach, all needs met. ?*Of note, pt frequently c/o therapeutic activities and exercises throughout session, requesting to return to room frequently due to fatigue. Required gentle encouragement and redirection throughout session.  ? ? ? ? ?Therapy Documentation ?Precautions:  ?Precautions ?Precautions: Fall ?Precaution Comments: per TJenetta Loges PA, no limitations to L shoulder s/p rotator cuff repair in March ?Restrictions ?Weight Bearing Restrictions: No ?General: ?  ? ?Therapy/Group:  Individual Therapy ? ?Wali Reinheimer P Arjun Hard ?05/06/2022, 7:45 AM  ?

## 2022-05-06 NOTE — Progress Notes (Signed)
PROGRESS NOTE   Subjective/Complaints: She is working with SLP in reviewing her medications and their indications. She has great questions about what she is on and why  ROS: Positive for LLE pain and numbness which is not new ongoing likely from diabetic neuropathy.  This is mostly in her toes of her left foot, +urinary urgency, +anxiety, +depression  Objective:   No results found. Recent Labs    05/04/22 0746  WBC 7.3  HGB 9.6*  HCT 31.4*  PLT 389   Recent Labs    05/04/22 0746  NA 136  K 4.0  CL 104  CO2 22  GLUCOSE 216*  BUN 21*  CREATININE 1.35*  CALCIUM 8.3*     Intake/Output Summary (Last 24 hours) at 05/06/2022 1102 Last data filed at 05/05/2022 2111 Gross per 24 hour  Intake 598 ml  Output --  Net 598 ml        Physical Exam: Vital Signs Blood pressure (!) 173/95, pulse 94, temperature 98 F (36.7 C), resp. rate 16, height  (1.575 m), weight 90.9 kg, SpO2 99 %. Gen: no distress, normal appearing, BMI 36.65 HEENT: oral mucosa pink and moist, NCAT Cardio: Tachycardia Chest: normal effort, normal rate of breathing Abd: soft, non-distended, +BS all quadrants Ext: no edema Psych: depressed mood. Anxious Skin: intact Neuro:  alert and oriented, No aphasia.  Cranial nerves II through XII intact other than decreased facial sensation on the left side can perform finger-to-nose but slow to initiate decree sensation left second toe through fifth toes able to follow one-step commands.  Fluent language but some delayed responses. Musculoskeletal: Tone is normal no joint swelling noted.  IV in both arms 5/5 in RUE and RLE 4-4+/5 in LLE.  Finger flexion L UE at least 4/5 no shoulder abduction noted appears not limited by pain or effort.  Able to hold elbow flexed against gravity.  Minimal active elbow extension appears to be able to hold arm extended when sitting Limited testing due to limited effort or pain  during examination.  Tenderness throughout proximal LUE and pain with passive ROM at elbow and shoulder.   Assessment/Plan: 1. Functional deficits which require 3+ hours per day of interdisciplinary therapy in a comprehensive inpatient rehab setting. Physiatrist is providing close team supervision and 24 hour management of active medical problems listed below. Physiatrist and rehab team continue to assess barriers to discharge/monitor patient progress toward functional and medical goals  Care Tool:  Bathing    Body parts bathed by patient: Right arm, Chest, Left arm, Abdomen, Front perineal area, Buttocks, Right upper leg, Left upper leg, Right lower leg, Left lower leg, Face         Bathing assist Assist Level: Set up assist     Upper Body Dressing/Undressing Upper body dressing   What is the patient wearing?: Bra, Dress    Upper body assist Assist Level: Minimal Assistance - Patient > 75%    Lower Body Dressing/Undressing Lower body dressing      What is the patient wearing?: Underwear/pull up     Lower body assist Assist for lower body dressing: Supervision/Verbal cueing     Toileting Toileting  Toileting assist Assist for toileting: Minimal Assistance - Patient > 75% Assistive Device Comment: walker   Transfers Chair/bed transfer  Transfers assist     Chair/bed transfer assist level: Minimal Assistance - Patient > 75%     Locomotion Ambulation   Ambulation assist      Assist level: Minimal Assistance - Patient > 75% Assistive device: No Device Max distance: 20'   Walk 10 feet activity   Assist     Assist level: Minimal Assistance - Patient > 75% Assistive device: No Device   Walk 50 feet activity   Assist Walk 50 feet with 2 turns activity did not occur: Safety/medical concerns (decreased endurnace without device)         Walk 150 feet activity   Assist Walk 150 feet activity did not occur: Safety/medical concerns          Walk 10 feet on uneven surface  activity   Assist     Assist level: Minimal Assistance - Patient > 75%     Wheelchair     Assist Is the patient using a wheelchair?: Yes (for energy conservation/time management but will not use at d/c anticipated) Type of Wheelchair: Manual    Wheelchair assist level: Dependent - Patient 0%      Wheelchair 50 feet with 2 turns activity    Assist        Assist Level: Dependent - Patient 0%   Wheelchair 150 feet activity     Assist      Assist Level: Dependent - Patient 0%   Blood pressure (!) 173/95, pulse 94, temperature 98 F (36.7 C), resp. rate 16, height 5\' 2"  (1.575 m), weight 90.9 kg, SpO2 99 %.  Medical Problem List and Plan: 1. Functional deficits secondary to right pontine infarct and punctate left hippocampus and frontal white matter infarcts. Neurology suspects cardioembolic vs small vessel disease source. 30 Day cardionet monitoring to rule out afib             -patient may shower             -ELOS/Goals: 12-14 days Sup to Mod I with PT/OT/SLP  Continue CIR  -Interdisciplinary Team Conference today   2.  Antithrombotics: -DVT/anticoagulation:  Pharmaceutical: Heparin             -antiplatelet therapy: Plavix. Allergy to aspirin 5/13 Plavix alone due to aspirin allergy. 3. Pain Management: Tylenol as needed -Chronic pain syndrome on Xtampza not restarted on admission -continue Lyrica 50 mg daily -restarted Norco 5mg  TID prn 5/13 continue pain management regimen: Lyrica, Norco, Xtampza 5/14 Pain controlled with current regimen. 4. Anxiety: LCSW to evaluate and provide emotional support             -antipsychotic agents: n/a             -anxiety: continue home Xanax 2mg  BID as needed.               -patient reported history of bipolar disorder: continue trazodone 150 mg q HS.  She is agreeable to neuropsychiatric evaluation Add magnesium gluconate 250mg  HS 5. Decreased safety awareness: Telesitter is  ordered. This patient is capable of making decisions on her own behalf. 6. Skin/Wound Care: Routine skin care checks 7. Fluids/Electrolytes/Nutrition: Routine I's and O's and follow-up chemistries 8: Hypertension: monitor TID/prn            --continue Lopressor 50 mg BID           -BP overall has been  well controlled 5/13 blood pressure systolic has been mostly in the 140s we will continue to trend May consider increasing Lopressor dose. 5/14 Trial of increased metoprolol dose of 100 mg BID from 50 mg BID, BP 157/92, HR 110.  Home lorsartan 25 mg has not been given inpatient, with slightly elevated Crt 1.41. Add magnesium gluconate 250mg  HS Add home clonidine 0.1mg  BID     05/06/2022    4:24 AM 05/05/2022    7:57 PM 05/05/2022    2:39 PM  Vitals with BMI  Systolic 173 143 05/07/2022  Diastolic 95 72 82  Pulse 94 100 102    9: Chronic systolic heart failure: no diuretics            -Continue metoprolol/losartan/farxiga, consider Entresto if BP tolerates -5/14 This admission pt has been given metoprolol and Farxiga but not losartan (Crt 1.41). Continue to trend Crt with qMonday labs before starting ARB.  10: DM-2: monitor CBGs, carb modified diet             --continue Farxiga 5 mg daily             --continue metformin 500 mg BID             --continue SSI 0-15u             -CBGs in 200s-260s, consider increase metformin if remains elevated, discussed diet 5/13 patient has been very noncompliant with her diet with note from nurse that she ate 2 trays at lunch and 2 trays at dinner of very high sugar and very high carbohydrate rate content.  She also had 2 spikes of elevated CBGs at 335 on 5/12, and 411 on 4/11.  She takes metformin 500 mg twice daily at home but on inspection it does not look like she has been taking this in the hospital.  Spoke to pharmacy today and 6/11.D. recommended resuming her at home dose of metformin in addition to Gerline Legacy 10 mg daily.  We will see if her blood  sugars equilibrate at a lower level with these changes. Increase metformin to 1,000mg  BID  CBG (last 3)  Recent Labs    05/05/22 1714 05/05/22 2113 05/06/22 0607  GLUCAP 249* 194* 157*    11: CKD 3b: Elevated BUN and creatinine.  Encourage p.o. fluids and follow-up BMP. 5/13 BUN has trended down some from 22-15, creatinine has trended down from 1.65-1.41 continue to encourage p.o. intake and will continue monitoring BMP. Monitor creatinine with increase in metformin back to home dose.     Latest Ref Rng & Units 05/04/2022    7:46 AM 04/30/2022    5:29 AM 04/27/2022   12:29 AM  BMP  Glucose 70 - 99 mg/dL 06/27/2022   295   284    BUN 6 - 20 mg/dL 21   15   22     Creatinine 0.44 - 1.00 mg/dL 132     4.40    Sodium 135 - 145 mmol/L 136   138   134    Potassium 3.5 - 5.1 mmol/L 4.0   4.2   4.7    Chloride 98 - 111 mmol/L 104   106   103    CO2 22 - 32 mmol/L 22   24   21     Calcium 8.9 - 10.3 mg/dL 8.3   8.4   8.9      12: CAD: left heart cath on 04/09/2022 Dr. 7.25; no significant stenosis. Discussed healthy diet  13: Hyperlipidemia: continue Lipitor 80 mg daily, Zetia 10 mg daily 14: PAD: seen in hospital consultation by Dr. Lenell AntuHawken 04/09/2022: follow-up at discharge for abnormal left ABI             --continue Plavix and statin 15: Tobacco use: Recently stopped and is not having cravings 16: Obesity: dietary counseling 17: Left breat mass: s/p lumpectomy 03/25/2022>>non-malignant.  Incision healing. 18: Food allergies: citrus/cucumber/apple juice/tree nuts,peanuts/pickles 19: Lactose intolerant: avoid dairy 20: GERD: Continue Protonix 21. Vitamin D deficiency: start ergocalciferol 50,000U once per week for 7 weeks. 22. Anemia: Hgb stable, monitor weekly 5/13 continue to monitor with weekly labs currently stable.    Latest Ref Rng & Units 05/04/2022    7:46 AM 05/01/2022    5:50 AM 04/30/2022    5:29 AM  CBC  WBC 4.0 - 10.5 K/uL 7.3    8.6    Hemoglobin 12.0 - 15.0 g/dL 9.6   9.1    9.0    Hematocrit 36.0 - 46.0 % 31.4   28.5   27.9    Platelets 150 - 400 K/uL 389    308      23. Insomnia: continue trazodone 50mg  HS 5/13 Currently sleep pattern is stable. 23. Urinary urgency: ordered bladder program 24. History of bipolar disorder: have placed on neuropsych schedule  LOS: 7 days A FACE TO FACE EVALUATION WAS PERFORMED  Tylea Hise P Johann Gascoigne 05/06/2022, 11:02 AM

## 2022-05-06 NOTE — Progress Notes (Signed)
Speech Language Pathology Daily Session Note ? ?Patient Details  ?Name: Erin Jackson ?MRN: [id number] ?Date of Birth: 08/25/1966 ? ?Today's Date: 05/06/2022 ?SLP Individual Time: 0930-1030 ?SLP Individual Time Calculation (min): 60 min ? ?Short Term Goals: ?Week 1: SLP Short Term Goal 1 (Week 1): STGs=LTGs due to ELOS ? ?Skilled Therapeutic Interventions:Skilled ST services focused on cognitive skills. SLP facilitated education, recall and verbal problem solving of current medication. SLP created list and pt was able to recall previous verse current medication differences, as well as answering verbal problem solving questions pertaining to medication multiple times per day mod I. MD entered room and pt continued to ask appropriate questions pertaining to medication as well as expressed concern with lack of assistance/available supervision at d/c. SLP and pt discussed anticipatory awareness pertaining to acute deficits and problem solved to communicate with pt's older son in [place] about a d/c plan. Pill organizer task was not completed due to appropriate time spent with MD. Pt was left in room with call bell within reach and chair alarm set. SLP recommends to continue skilled services. ?   ? ?Pain ?Pain Assessment ?Pain Score: 0-No pain ? ?Therapy/Group: Individual Therapy ? ?Nathifa Ritthaler ?05/06/2022, 1:31 PM ?

## 2022-05-07 DIAGNOSIS — Z8659 Personal history of other mental and behavioral disorders: Secondary | ICD-10-CM

## 2022-05-07 DIAGNOSIS — F5101 Primary insomnia: Secondary | ICD-10-CM

## 2022-05-07 LAB — BASIC METABOLIC PANEL
Anion gap: 9 (ref 5–15)
BUN: 21 mg/dL — ABNORMAL HIGH (ref 6–20)
CO2: 23 mmol/L (ref 22–32)
Calcium: 7.9 mg/dL — ABNORMAL LOW (ref 8.9–10.3)
Chloride: 107 mmol/L (ref 98–111)
Creatinine, Ser: 1.32 mg/dL — ABNORMAL HIGH (ref 0.44–1.00)
GFR, Estimated: 47 mL/min — ABNORMAL LOW (ref >60)
Glucose, Bld: 132 mg/dL — ABNORMAL HIGH (ref 70–99)
Potassium: 3.9 mmol/L (ref 3.5–5.1)
Sodium: 139 mmol/L (ref 135–145)

## 2022-05-07 LAB — GLUCOSE, CAPILLARY
Glucose-Capillary: 123 mg/dL — ABNORMAL HIGH (ref 70–99)
Glucose-Capillary: 124 mg/dL — ABNORMAL HIGH (ref 70–99)
Glucose-Capillary: 130 mg/dL — ABNORMAL HIGH (ref 70–99)
Glucose-Capillary: 195 mg/dL — ABNORMAL HIGH (ref 70–99)

## 2022-05-07 LAB — MAGNESIUM: Magnesium: 0.6 mg/dL — CL (ref 1.7–2.4)

## 2022-05-07 MED ORDER — ACETAMINOPHEN 325 MG PO TABS: 650.0000 mg | ORAL_TABLET | ORAL | Status: AC | PRN

## 2022-05-07 MED ORDER — CLOPIDOGREL BISULFATE 75 MG PO TABS
75.0000 mg | ORAL_TABLET | Freq: Every day | ORAL | 0 refills | Status: AC
  Filled 2022-05-07: qty 30, 30d supply, fill #0

## 2022-05-07 MED ORDER — HYDROCODONE-ACETAMINOPHEN 5-325 MG PO TABS: 2.0000 | ORAL_TABLET | Freq: Three times a day (TID) | ORAL | 0 refills | Status: DC | PRN

## 2022-05-07 MED ORDER — PROCHLORPERAZINE MALEATE 5 MG PO TABS
5.0000 mg | ORAL_TABLET | Freq: Four times a day (QID) | ORAL | 0 refills | Status: DC | PRN
  Filled 2022-05-07: qty 30, 4d supply, fill #0

## 2022-05-07 MED ORDER — MAGNESIUM SULFATE 4 GM/100ML IV SOLN
4.0000 g | Freq: Once | INTRAVENOUS | Status: DC
Start: 2022-05-07 — End: 2022-05-07

## 2022-05-07 MED ORDER — ATORVASTATIN CALCIUM 80 MG PO TABS
80.0000 mg | ORAL_TABLET | Freq: Every day | ORAL | 0 refills | Status: AC
Start: 2022-05-07 — End: ?
  Filled 2022-05-07: qty 30, 30d supply, fill #0

## 2022-05-07 MED ORDER — PREGABALIN 75 MG PO CAPS: 75.0000 mg | ORAL_CAPSULE | Freq: Every day | ORAL | 0 refills | Status: DC

## 2022-05-07 MED ORDER — ALPRAZOLAM 2 MG PO TABS: 1.0000 mg | ORAL_TABLET | Freq: Two times a day (BID) | ORAL | 0 refills | Status: DC | PRN

## 2022-05-07 MED ORDER — CLONIDINE HCL 0.1 MG PO TABS
0.1000 mg | ORAL_TABLET | Freq: Two times a day (BID) | ORAL | 0 refills | Status: DC
  Filled 2022-05-07: qty 60, 30d supply, fill #0

## 2022-05-07 MED ORDER — MAGNESIUM GLUCONATE 500 MG PO TABS
500.0000 mg | ORAL_TABLET | Freq: Two times a day (BID) | ORAL | 0 refills | Status: DC
  Filled 2022-05-07: qty 60, 30d supply, fill #0

## 2022-05-07 MED ORDER — METOPROLOL TARTRATE 100 MG PO TABS
100.0000 mg | ORAL_TABLET | Freq: Two times a day (BID) | ORAL | 0 refills | Status: DC
  Filled 2022-05-07: qty 60, 30d supply, fill #0

## 2022-05-07 MED ORDER — METHOCARBAMOL 500 MG PO TABS
500.0000 mg | ORAL_TABLET | Freq: Four times a day (QID) | ORAL | 0 refills | Status: DC | PRN
  Filled 2022-05-07: qty 30, 8d supply, fill #0

## 2022-05-07 MED ORDER — MECLIZINE HCL 12.5 MG PO TABS
12.5000 mg | ORAL_TABLET | Freq: Two times a day (BID) | ORAL | 0 refills | Status: DC | PRN
  Filled 2022-05-07: qty 30, 15d supply, fill #0

## 2022-05-07 MED ORDER — VITAMIN D (ERGOCALCIFEROL) 1.25 MG (50000 UNIT) PO CAPS: 50000.0000 [IU] | ORAL_CAPSULE | ORAL | Status: AC

## 2022-05-07 MED ORDER — METFORMIN HCL ER (OSM) 1000 MG PO TB24
1000.0000 mg | ORAL_TABLET | Freq: Two times a day (BID) | ORAL | 0 refills | Status: DC
  Filled 2022-05-07: qty 60, 30d supply, fill #0

## 2022-05-07 MED ORDER — MAGNESIUM SULFATE 4 GM/100ML IV SOLN
4.0000 g | Freq: Once | INTRAVENOUS | Status: AC
  Administered 2022-05-07: 4 g via INTRAVENOUS
  Filled 2022-05-07: qty 100

## 2022-05-07 MED ORDER — HYDROCODONE-ACETAMINOPHEN 5-325 MG PO TABS
2.0000 | ORAL_TABLET | Freq: Three times a day (TID) | ORAL | 0 refills | Status: DC | PRN
  Filled 2022-05-07: qty 30, 5d supply, fill #0

## 2022-05-07 MED ORDER — MAGNESIUM GLUCONATE 500 MG PO TABS
250.0000 mg | ORAL_TABLET | Freq: Every day | ORAL | 0 refills | Status: DC
  Filled 2022-05-07: qty 30, 60d supply, fill #0

## 2022-05-07 MED ORDER — TRAZODONE HCL 150 MG PO TABS
150.0000 mg | ORAL_TABLET | Freq: Every day | ORAL | 0 refills | Status: AC
  Filled 2022-05-07: qty 30, 30d supply, fill #0

## 2022-05-07 MED ORDER — METFORMIN HCL 500 MG PO TABS
500.0000 mg | ORAL_TABLET | Freq: Two times a day (BID) | ORAL | 0 refills | Status: DC
  Filled 2022-05-07: qty 60, 30d supply, fill #0

## 2022-05-07 MED ORDER — MAGNESIUM SULFATE 2 GM/50ML IV SOLN: 2.0000 g | Freq: Once | INTRAVENOUS | Status: DC

## 2022-05-07 MED ORDER — PREGABALIN 75 MG PO CAPS
75.0000 mg | ORAL_CAPSULE | Freq: Every day | ORAL | 0 refills | Status: DC
  Filled 2022-05-07: qty 30, 30d supply, fill #0

## 2022-05-07 NOTE — Progress Notes (Addendum)
Occupational Therapy Discharge Summary  Patient Details  Name: Erin Jackson MRN: [id number] Date of Birth: Aug 14, 1966  Today's Date: 05/07/2022 OT Individual Time: 0845-1000 OT Individual Time Calculation (min): 75 min    Patient has met 13 of 16 long term goals due to improved activity tolerance, improved balance, postural control, ability to compensate for deficits, improved attention, improved awareness, and improved coordination.  Patient to discharge at overall Modified Independent level for BADLs and supervision/min assist for IADLs.   Patient's care partner unavailable to attend caregiver education, however pt will be able to direct care for IADLs to son who will provide the necessary cognitive assistance at discharge.    Reasons goals not met: Requires supervision assist for IADLs and goal set at mod I. Pt will likely not be mod I with IADLs due to recent RCR repair with pain and weakness of left shoulder.  Recommendation:  Patient will benefit from ongoing skilled OT services in home health setting to continue to advance functional skills in the area of BADL, IADLs, and progressive strengthening of left shoulder.  Equipment: Electronics engineer and RW  Reasons for discharge: treatment goals met and discharge from hospital  Patient/family agrees with progress made and goals achieved: Yes  OT Discharge Precautions/Restrictions  Precautions Precautions: Fall Precaution Comments: per Jenetta Loges, PA, no limitations to L shoulder s/p rotator cuff repair in March Restrictions Weight Bearing Restrictions: No General Chart Reviewed: Yes Vital Signs Therapy Vitals Temp: 98 F (36.7 C) Pulse Rate: (!) 101 Resp: 18 BP: (!) 114/58 Patient Position (if appropriate): Lying Oxygen Therapy SpO2: 98 % O2 Device: Room Air Pain Pain Assessment Pain Scale: 0-10 Pain Score: 10-Worst pain ever Pain Type: Chronic pain Pain Location: Leg Pain Orientation: Left Pain Descriptors /  Indicators: Burning;Sharp Pain Frequency: Constant Pain Onset: On-going Patients Stated Pain Goal: 0 Pain Intervention(s): Medication (See eMAR);RN made aware Multiple Pain Sites: No ADL ADL Eating: Independent Where Assessed-Eating: Chair Grooming: Setup Where Assessed-Grooming: Sitting at sink Upper Body Bathing: Modified independent Where Assessed-Upper Body Bathing: Shower Lower Body Bathing: Modified independent Where Assessed-Lower Body Bathing: Shower Upper Body Dressing: Modified independent (Device) Where Assessed-Upper Body Dressing: Edge of bed Lower Body Dressing: Modified independent Where Assessed-Lower Body Dressing: Edge of bed Toileting: Modified independent Where Assessed-Toileting: Glass blower/designer: Distant supervision Armed forces technical officer Method: Magazine features editor: Environmental education officer Method: Heritage manager: Gaffer Baseline Vision/History: 1 Wears glasses Patient Visual Report: Blurring of vision;Nausea/blurring vision with head movement Vision Assessment?: Vision impaired- to be further tested in functional context Perception  Perception: Within Functional Limits Praxis Praxis: Intact Cognition Cognition Overall Cognitive Status: History of cognitive impairments - at baseline Arousal/Alertness: Awake/alert Orientation Level: Person;Place;Situation Person: Oriented Place: Oriented Situation: Oriented Memory: Appears intact Attention: Selective;Alternating Selective Attention: Appears intact Awareness: Appears intact Problem Solving: Appears intact Problem Solving Impairment: Functional complex;Verbal complex Safety/Judgment: Appears intact Sensation Sensation Light Touch: Impaired by gross assessment Light Touch Impaired Details: Impaired LLE Hot/Cold: Not tested Proprioception: Impaired by gross assessment Stereognosis: Not tested Additional Comments: pt  reports decreased sensation on LLE, knee down. Coordination Gross Motor Movements are Fluid and Coordinated: No Fine Motor Movements are Fluid and Coordinated: Yes Coordination and Movement Description: Limited by generalized weakness and deconditioning Motor  Motor Motor: Within Functional Limits Mobility  Bed Mobility Bed Mobility: Supine to Sit;Sit to Supine Supine to Sit: Independent with assistive device Sit to Supine: Independent with assistive device Transfers Sit to Stand: Independent  with assistive device Stand to Sit: Independent with assistive device  Trunk/Postural Assessment  Cervical Assessment Cervical Assessment: Within Functional Limits Thoracic Assessment Thoracic Assessment: Within Functional Limits Thoracic AROM Overall Thoracic AROM: Due to premorid status Overall Thoracic AROM Comments: Limited sustained thoracic extension, stays in flexion Lumbar Assessment Lumbar Assessment: Within Functional Limits Lumbar AROM Overall Lumbar AROM: Due to premorid status Overall Lumbar AROM Comments: Limited sustained anterior pelvic tilt, stays posterior Postural Control Postural Control: Within Functional Limits  Balance Balance Balance Assessed: Yes Standardized Balance Assessment Standardized Balance Assessment: Berg Balance Test Berg Balance Test Sit to Stand: Able to stand without using hands and stabilize independently Standing Unsupported: Able to stand 30 seconds unsupported (59mn:40 seconds) Sitting with Back Unsupported but Feet Supported on Floor or Stool: Able to sit safely and securely 2 minutes Stand to Sit: Sits safely with minimal use of hands Transfers: Able to transfer safely, minor use of hands Standing Unsupported with Eyes Closed: Able to stand 10 seconds safely Standing Ubsupported with Feet Together: Able to place feet together independently and stand 1 minute safely From Standing, Reach Forward with Outstretched Arm: Reaches forward but  needs supervision From Standing Position, Pick up Object from Floor: Able to pick up shoe safely and easily From Standing Position, Turn to Look Behind Over each Shoulder: Looks behind one side only/other side shows less weight shift Turn 360 Degrees: Able to turn 360 degrees safely in 4 seconds or less Standing Unsupported, Alternately Place Feet on Step/Stool: Able to complete 4 steps without aid or supervision Standing Unsupported, One Foot in Front: Needs help to step but can hold 15 seconds Standing on One Leg: Unable to try or needs assist to prevent fall Total Score: 41 Timed Up and Go Test TUG: Normal TUG (w/ RW) Normal TUG (seconds): 42 Dynamic Sitting Balance Dynamic Sitting - Balance Support: No upper extremity supported;Feet supported Dynamic Sitting - Level of Assistance: 6: Modified independent (Device/Increase time) Static Standing Balance Static Standing - Balance Support: Bilateral upper extremity supported Static Standing - Level of Assistance: 6: Modified independent (Device/Increase time) Dynamic Standing Balance Dynamic Standing - Balance Support: Bilateral upper extremity supported;During functional activity Dynamic Standing - Level of Assistance: 6: Modified independent (Device/Increase time) Extremity/Trunk Assessment RUE Assessment RUE Assessment: Within Functional Limits LUE Assessment LUE Assessment: Exceptions to WSouthern California Stone CenterPassive Range of Motion (PROM) Comments: Shoulder FF: ~150; abduction ~150; ER/IR WNL Active Range of Motion (AROM) Comments: In supine less than 10 degrees active motion, in function has at least 45 degrees of forward reach General Strength Comments: 2+/5 shoulder due to recent RCR repair   BEzekiel Slocumb5/18/2023, 2:31 PM

## 2022-05-07 NOTE — Progress Notes (Signed)
Inpatient Rehabilitation Care Coordinator Discharge Note   Patient Details  Name: Erin Jackson MRN: [id number] Date of Birth: 11-18-1966   Discharge location: HOME WITH SON WHO MAY NOT ASSIST PT. MAY PLAN ON GOING TO [place] WHERE OTHER SON-GEORGE IS  Length of Stay: 9 DAYS  Discharge activity level: SUPERVISION-MOD/I LEVEL  Home/community participation: ACTIVE  Patient response [id number] Literacy - How often do you need to have someone help you when you read instructions, pamphlets, or other written material from your doctor or pharmacy?: Never  Patient response [id number] Isolation - How often do you feel lonely or isolated from those around you?: Sometimes  Services provided included: MD, RD, PT, OT, SLP, RN, CM, TR, Pharmacy, Neuropsych, SW  Financial Services:  Charity fundraiser Utilized: Private Insurance [facility] MEDICARE  Choices offered to/list presented to: PT  Follow-up services arranged:  Home Health, Patient/Family has no preference for HH/DME agencies, DME, [place]: [facility]    DME : [facility] [place]. TUB BENCH WILL BE DELIVERED TO HER HOME.  MENTAL HEALTH RESOURCES GIVEN TO PT TO FOLLOW UP WITH FOR COUNSELING. HAS TRANSPORTATION VIA [employer] MEDICARE AND HER MEDICAID SHE IS ALREADY CONNECTED WITH THIS    Patient response to transportation need: Is the patient able to respond to transportation needs?: Yes In the past 12 months, has lack of transportation kept you from medical appointments or from getting medications?: No In the past 12 months, has lack of transportation kept you from meetings, work, or from getting things needed for daily living?: No    Comments (or additional information): PT HAVING DIFFICULTY WITH COPING DUE TO PAST TRAUMA. MENTAL HEALTH RESOURCES GIVEN TO PT TO FOLLOW UP WITH. HOPEFULLY GEORGE-SON WILL COME AND HAVE HER GO TO [place] WITH HIM  WHERE SHE HAS FAMILY AND FRIENDS. HERE SHE HAS A SON WHOM LIVES WITH HER AND DOES NOT ASSIST.   Patient/Family verbalized understanding of follow-up arrangements:  Yes  Individual responsible for coordination of the follow-up plan: SELF [phone]  Confirmed correct DME delivered: Elease Hashimoto 05/07/2022    [place], Gardiner Rhyme

## 2022-05-07 NOTE — Progress Notes (Signed)
Speech Language Pathology Discharge Summary  Patient Details  Name: Erin Jackson MRN: [id number] Date of Birth: 1966-01-16  Today's Date: 05/07/2022 SLP Individual Time: 1050-1140 SLP Individual Time Calculation (min): 50 min   Skilled Therapeutic Interventions:   Pt seen this date for skilled ST intervention targeting cognitive-linguistic skills outlined in care plan. Pt received lethargic, though awake. Sitting on EOB and recently finished speaking with hospital chaplain. Agreeable to ST intervention.  SLP facilitated today's session by providing Sup A for completion of functional problem-solving in relation to placing a lunch order by phone and Mod I for verbal problem-solving re: money and medication management. SLP provided skilled education re: importance of receiving intermittent supervision/assistance with more complex iADL tasks as needed; she verbalized understanding. Additionally, provided education re: s/sx c/f stroke (e.g. BE FAST acronym), which she recalled following a short delay and distractions with Sup A question prompts. Noted to adhere to safety precautions with Mod I with SLP present.   Pt transferred from w/c<>recliner chair with Min A + RW for safety, safety alarm activated, call bell within reach, and all immediate needs met. Pt to discharge from [place] this date in light of upcoming hospital discharge. Please see below for details.   Patient has met 3 of 4 long term goals.  Patient to discharge at overall Supervision level.  Reasons goals not met: Pt continues to require Sup A to Min A for complex daily problem-solving situations   Clinical Impression/Discharge Summary:  Pt demonstrated steady progress during CIR admission as evident by meeting 3 out of 4 long-tern goals outlined in her care plan. Unable to meet complex problem-solving goals due to severity of emotional dysregulation and mood. Pt education completed. Pt continues to present with mild cognitive impairment  likely secondary to pre-morbid psychosocial factors vs CVA. Recommended and reinforced importance of counseling post-discharge, and how cognitive abilities can be negatively impacted by mood disorders (depression/anxiety); she verbalized understanding and appeared receptive to counseling; list provided by CSW. Furthermore, recommend intermittent supervision and assistance with iADL tasks once at home, particularly with fatigue. ST intervention does not appear indicated at time of discharge from CIR, with pt appearing to be at her cognitive baseline. Pt in agreement with recommendations and plan.   Care Partner:  Caregiver Able to Provide Assistance: Yes  Type of Caregiver Assistance: Physical;Cognitive  Recommendation:  Intermittent supervision/assistance   Equipment: N/A   Reasons for discharge: Discharged from hospital   Patient/Family Agrees with Progress Made and Goals Achieved: Yes    Bayyinah Dukeman A Naythan Douthit 05/07/2022, 3:05 PM

## 2022-05-07 NOTE — Discharge Summary (Signed)
Physical Therapy Discharge Summary  Patient Details  Name: Erin Jackson MRN: [id number] Date of Birth: 1966/06/08  Today's Date: 05/07/2022 PT Individual Time: 1300-1355 PT Individual Time Calculation (min): 55 min   Patient has met 8 of 8 long term goals due to improved activity tolerance, improved balance, improved postural control, increased strength, ability to compensate for deficits, improved attention, and improved awareness.  Patient to discharge at an ambulatory level Supervision / mod I.  Patient's care partner is independent to provide the necessary physical and cognitive assistance at discharge.  Reasons goals not met: n/a  Recommendation:  Patient will benefit from ongoing skilled PT services in home health setting to continue to advance safe functional mobility, address ongoing impairments in generalized weakness and deconditioning, dynamic standing balance, gait training, home safety, and minimize fall risk.  Equipment: RW  Reasons for discharge: treatment goals met and discharge from hospital  Patient/family agrees with progress made and goals achieved: Yes  PT Discharge Precautions/Restrictions Precautions Precautions: Fall Precaution Comments: per Jenetta Loges, PA, no limitations to L shoulder s/p rotator cuff repair in March Restrictions Weight Bearing Restrictions: No Pain Interference Pain Interference Pain Effect on Sleep: 2. Occasionally Pain Interference with Therapy Activities: 2. Occasionally Pain Interference with Day-to-Day Activities: 2. Occasionally Vision/Perception  Vision - History Ability to See in Adequate Light: 1 Impaired (light sensitivity - benefits from wearing sunglasses) Perception Perception: Within Functional Limits Praxis Praxis: Intact  Cognition Overall Cognitive Status: History of cognitive impairments - at baseline Arousal/Alertness: Awake/alert Orientation Level: Oriented X4 Attention: Selective;Alternating Selective  Attention: Appears intact Alternating Attention: Appears intact Memory: Appears intact Awareness: Appears intact Problem Solving: Impaired Problem Solving Impairment: Functional complex;Verbal complex Safety/Judgment: Appears intact Sensation Sensation Light Touch: Impaired by gross assessment Light Touch Impaired Details: Impaired LLE Hot/Cold: Not tested Proprioception: Impaired by gross assessment Stereognosis: Not tested Additional Comments: pt reports decreased sensation on LLE, knee down. Coordination Gross Motor Movements are Fluid and Coordinated: No Fine Motor Movements are Fluid and Coordinated: No Coordination and Movement Description: Limited by generalized weakness and deconditioning Motor  Motor Motor: Hemiplegia;Abnormal postural alignment and control;Abnormal tone  Mobility Bed Mobility Bed Mobility: Supine to Sit;Sit to Supine Supine to Sit: Independent with assistive device Sit to Supine: Independent with assistive device Transfers Transfers: Sit to Stand;Stand to Sit;Stand Pivot Transfers Sit to Stand: Independent with assistive device Stand to Sit: Independent with assistive device Stand Pivot Transfers: Independent with assistive device Transfer (Assistive device): Rolling walker Locomotion  Gait Ambulation: Yes Gait Assistance: Supervision/Verbal cueing Gait Distance (Feet): 150 Feet Assistive device: Rolling walker Gait Assistance Details: Verbal cues for safe use of DME/AE;Verbal cues for precautions/safety Gait Gait: Yes Gait Pattern: Impaired Gait Pattern: Step-through pattern;Trunk flexed;Narrow base of support Stairs / Additional Locomotion Stairs: Yes Stairs Assistance: Supervision/Verbal cueing Stair Management Technique: Two rails Number of Stairs: 4 Height of Stairs: 6 Wheelchair Mobility Wheelchair Mobility: No  Trunk/Postural Assessment  Cervical Assessment Cervical Assessment: Within Functional Limits Thoracic  Assessment Thoracic Assessment: Exceptions to [facility] (flexed posture) Thoracic AROM Overall Thoracic AROM: Due to premorid status Overall Thoracic AROM Comments: Limited sustained thoracic extension, stays in flexion Lumbar Assessment Lumbar Assessment: Exceptions to [facility] (posterior tilt) Lumbar AROM Overall Lumbar AROM: Due to premorid status Overall Lumbar AROM Comments: Limited sustained anterior pelvic tilt, stays posterior Postural Control Postural Control: Within Functional Limits  Balance Balance Balance Assessed: Yes Standardized Balance Assessment Standardized Balance Assessment: Timed Up and Go Test Timed Up and Go Test TUG: Normal TUG (  w/ RW) Normal TUG (seconds): 42 Dynamic Sitting Balance Dynamic Sitting - Balance Support: No upper extremity supported Dynamic Sitting - Level of Assistance: 6: Modified independent (Device/Increase time) Static Standing Balance Static Standing - Balance Support: Bilateral upper extremity supported Static Standing - Level of Assistance: 6: Modified independent (Device/Increase time) Dynamic Standing Balance Dynamic Standing - Balance Support: Bilateral upper extremity supported;During functional activity Dynamic Standing - Level of Assistance: 6: Modified independent (Device/Increase time) Extremity Assessment  RLE Assessment RLE Assessment: Within Functional Limits General Strength Comments: decreased muscular endurnace LLE Assessment General Strength Comments: Self and effort limiting. 3-/5 to 3/5 during formal MMT, but with functional activity pt able to sustain and tolerate single limb stance on left with gait and stairs   Skilled Intervention: Pt seen sidelying in bed - agreeable to PT tx. Denies pain to start. Flat affect with limited engagement during therapy session. Reports she was about to nap prior to arrival and required encouragement for participation., Bed mobility completed mod I. Sit<>stand to RW mod I and ambulated ~160f  with supervision in rehab hallways with RW. Transported remaining distance to main rehab gym for energy conservation. Instructed in stair training where she navigated up/down x8 steps using 1 hand rail with supervision and then 4 + 4 steps without rails with supervision - side stepping method which she's done prior to hospitalization. Cues for safety awareness due to fast paced activity as pt was rushing to complete so she could be finished with therapy. Completed TUG with RW and supervision - 42 seconds. Scores > 13.5 seconds indicate increased falls risk. 5xSTS from w/c completed in 28 seconds - scores > 15 indicate increased falls risk. BERG balance test completed (see detailed results above).   Patient demonstrates increased fall risk as noted by score of  41/56 on Berg Balance Scale.  (<36= high risk for falls, close to 100%; 37-45 significant >80%; 46-51 moderate >50%; 52-55 lower >25%).  Pt c/o dizziness during BERG balance testing. Requesting to return to her room because of this and fatigue.   Transported back to her room in w/c. Ambulatory transfer with RW and supervision back to bed. Concluded session seated EOB with bed alarm on, call bell in reach. RN notified. She missed 15 minutes due to fatigue.   Devlyn Retter P Javeon Macmurray PT 05/07/2022, 12:55 PM

## 2022-05-07 NOTE — Plan of Care (Signed)
  Problem: RH Problem Solving Goal: LTG Patient will demonstrate problem solving for (SLP) Description: LTG:  Patient will demonstrate problem solving for basic/complex daily situations with cues  (SLP) Outcome: Not Met (add Reason) Flowsheets Taken 05/07/2022 1843 by Helaine Chess A, CCC-SLP LTG Patient will demonstrate problem solving for: Minimal Assistance - Patient > 75% Taken 04/30/2022 1249 by Buzzy Han, CCC-SLP LTG: Patient will demonstrate problem solving for (SLP): Complex daily situations Note: Pt limited by psychological factors
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
## 2022-05-07 NOTE — Progress Notes (Addendum)
Patient ID: Erin Jackson, female   DOB: [birth year], 56 y.o.   MRN: [id number]  According to MD and OT pt is going home tomorrow, her son-George can not come and she will go home to her home tomorrow. She feels prepared and reports her son whom lives with her will open the door due to he has her keys or the apartment manager will. She will call medicaid to get herself a ride home, she has used this system before for her appointments. Her rolling walker was delivered to her room and her tub bench will go to her home. Will look for home health agency to take her referral. Will also give pt counseling resources she can follow up on. Her eventual plan is to go to [place] where she has family and friends.   12:58 PM [facility] has accepted pts referral.

## 2022-05-07 NOTE — Progress Notes (Signed)
PROGRESS NOTE   Subjective/Complaints: Trying to get appointment with her PCP but was told she will have to follow-up with a different nurse practitioner and she is not pleased about this  ROS: Positive for LLE pain and numbness which is not new ongoing likely from diabetic neuropathy.  This is mostly in her toes of her left foot, +urinary urgency, +anxiety, +depression, +insomnia  Objective:   No results found. No results for input(s): WBC, HGB, HCT, PLT in the last 72 hours.  Recent Labs    05/07/22 0522  NA 139  K 3.9  CL 107  CO2 23  GLUCOSE 132*  BUN 21*  CREATININE 1.32*  CALCIUM 7.9*     Intake/Output Summary (Last 24 hours) at 05/07/2022 1247 Last data filed at 05/06/2022 2215 Gross per 24 hour  Intake 120 ml  Output --  Net 120 ml        Physical Exam: Vital Signs Blood pressure 130/90, pulse 89, temperature 97.8 F (36.6 C), temperature source Oral, resp. rate 16, height 5\' 2"  (1.575 m), weight 90.9 kg, SpO2 100 %. Gen: no distress, normal appearing, BMI 36.65 HEENT: oral mucosa pink and moist, NCAT Cardio: regular HR Chest: normal effort, normal rate of breathing Abd: soft, non-distended, +BS all quadrants Ext: no edema Psych: depressed mood. Anxious Skin: intact Neuro:  alert and oriented, No aphasia.  Cranial nerves II through XII intact other than decreased facial sensation on the left side can perform finger-to-nose but slow to initiate decree sensation left second toe through fifth toes able to follow one-step commands.  Fluent language but some delayed responses. Musculoskeletal: Tone is normal no joint swelling noted.  IV in both arms 5/5 in RUE and RLE 4-4+/5 in LLE.  Finger flexion L UE at least 4/5 no shoulder abduction noted appears not limited by pain or effort.  Able to hold elbow flexed against gravity.  Minimal active elbow extension appears to be able to hold arm extended when sitting  Limited testing due to limited effort or pain during examination.  Tenderness throughout proximal LUE and pain with passive ROM at elbow and shoulder.   Assessment/Plan: 1. Functional deficits which require 3+ hours per day of interdisciplinary therapy in a comprehensive inpatient rehab setting. Physiatrist is providing close team supervision and 24 hour management of active medical problems listed below. Physiatrist and rehab team continue to assess barriers to discharge/monitor patient progress toward functional and medical goals  Care Tool:  Bathing    Body parts bathed by patient: Right arm, Chest, Left arm, Abdomen, Front perineal area, Buttocks, Right upper leg, Left upper leg, Right lower leg, Left lower leg, Face         Bathing assist Assist Level: Set up assist     Upper Body Dressing/Undressing Upper body dressing   What is the patient wearing?: Bra, Dress    Upper body assist Assist Level: Minimal Assistance - Patient > 75%    Lower Body Dressing/Undressing Lower body dressing      What is the patient wearing?: Underwear/pull up     Lower body assist Assist for lower body dressing: Supervision/Verbal cueing     Toileting Toileting  Toileting assist Assist for toileting: Minimal Assistance - Patient > 75% Assistive Device Comment: walker   Transfers Chair/bed transfer  Transfers assist     Chair/bed transfer assist level: Minimal Assistance - Patient > 75%     Locomotion Ambulation   Ambulation assist      Assist level: Minimal Assistance - Patient > 75% Assistive device: No Device Max distance: 20'   Walk 10 feet activity   Assist     Assist level: Minimal Assistance - Patient > 75% Assistive device: No Device   Walk 50 feet activity   Assist Walk 50 feet with 2 turns activity did not occur: Safety/medical concerns (decreased endurnace without device)         Walk 150 feet activity   Assist Walk 150 feet activity did  not occur: Safety/medical concerns         Walk 10 feet on uneven surface  activity   Assist     Assist level: Minimal Assistance - Patient > 75%     Wheelchair     Assist Is the patient using a wheelchair?: Yes (for energy conservation/time management but will not use at d/c anticipated) Type of Wheelchair: Manual    Wheelchair assist level: Dependent - Patient 0%      Wheelchair 50 feet with 2 turns activity    Assist        Assist Level: Dependent - Patient 0%   Wheelchair 150 feet activity     Assist      Assist Level: Dependent - Patient 0%   Blood pressure 130/90, pulse 89, temperature 97.8 F (36.6 C), temperature source Oral, resp. rate 16, height  (1.575 m), weight 90.9 kg, SpO2 100 %.  Medical Problem List and Plan: 1. Functional deficits secondary to right pontine infarct and punctate left hippocampus and frontal white matter infarcts. Neurology suspects cardioembolic vs small vessel disease source. 30 Day cardionet monitoring to rule out afib             -patient may shower             -ELOS/Goals: 12-14 days Sup to Mod I with PT/OT/SLP  Discussed d/c home tomorrow 2.  Antithrombotics: -DVT/anticoagulation:  Pharmaceutical: Heparin             -antiplatelet therapy: Plavix. Allergy to aspirin 5/13 Plavix alone due to aspirin allergy. 3. Pain Management: Tylenol as needed -Chronic pain syndrome on Xtampza not restarted on admission -continue Lyrica 50 mg daily -restarted Norco  TID prn 5/13 continue pain management regimen: Lyrica, Norco, Xtampza 5/14 Pain controlled with current regimen. 4. Anxiety: LCSW to evaluate and provide emotional support             -antipsychotic agents: n/a             -anxiety: continue home Xanax  BID as needed.               -patient reported history of bipolar disorder: continue trazodone 150 mg q HS.  She is agreeable to neuropsychiatric evaluation Add magnesium gluconate  HS 5.  Decreased safety awareness: Telesitter is ordered. This patient is capable of making decisions on her own behalf. 6. Skin/Wound Care: Routine skin care checks 7. Fluids/Electrolytes/Nutrition: Routine I's and O's and follow-up chemistries 8: Hypertension: monitor TID/prn            --continue Lopressor 50 mg BID           -BP overall has been well controlled 5/13  blood pressure systolic has been mostly in the 140s we will continue to trend May consider increasing Lopressor dose. 5/14 Trial of increased metoprolol dose of 100 mg BID from 50 mg BID, BP 157/92, HR 110.  Home lorsartan 25 mg has not been given inpatient, with slightly elevated Crt 1.41. Add magnesium gluconate 250mg  HS Add home clonidine 0.1mg  BID     05/07/2022    5:07 AM 05/06/2022    8:50 PM 05/06/2022    1:56 PM  Vitals with BMI  Systolic 130 135 05/08/2022  Diastolic 90 93 94  Pulse 89 103 105    9: Chronic systolic heart failure: no diuretics            -Continue metoprolol/losartan/farxiga, consider Entresto if BP tolerates -5/14 This admission pt has been given metoprolol and Farxiga but not losartan (Crt 1.41). Continue to trend Crt with qMonday labs before starting ARB.  10: DM-2: monitor CBGs, carb modified diet             --continue Farxiga 5 mg daily             --continue metformin 500 mg BID             --continue SSI 0-15u             -CBGs in 200s-260s, consider increase metformin if remains elevated, discussed diet 5/13 patient has been very noncompliant with her diet with note from nurse that she ate 2 trays at lunch and 2 trays at dinner of very high sugar and very high carbohydrate rate content.  She also had 2 spikes of elevated CBGs at 335 on 5/12, and 411 on 4/11.  She takes metformin 500 mg twice daily at home but on inspection it does not look like she has been taking this in the hospital.  Spoke to pharmacy today and 6/11.D. recommended resuming her at home dose of metformin in addition to Gerline Legacy  10 mg daily.  We will see if her blood sugars equilibrate at a lower level with these changes. Increase metformin to 1,000mg  BID  CBG (last 3)  Recent Labs    05/06/22 2127 05/07/22 0535 05/07/22 1148  GLUCAP 229* 123* 124*    11: CKD 3b: Elevated BUN and creatinine.  Encourage p.o. fluids and follow-up BMP. 5/13 BUN has trended down some from 22-15, creatinine has trended down from 1.65-1.41 continue to encourage p.o. intake and will continue monitoring BMP. Monitor creatinine with increase in metformin back to home dose.     Latest Ref Rng & Units 05/07/2022    5:22 AM 05/04/2022    7:46 AM 04/30/2022    5:29 AM  BMP  Glucose 70 - 99 mg/dL 06/30/2022   222   979    BUN 6 - 20 mg/dL 21   21   15     Creatinine 0.44 - 1.00 mg/dL 892     1.19    Sodium 135 - 145 mmol/L 139   136   138    Potassium 3.5 - 5.1 mmol/L 3.9   4.0   4.2    Chloride 98 - 111 mmol/L 107   104   106    CO2 22 - 32 mmol/L 23   22   24     Calcium 8.9 - 10.3 mg/dL 7.9   8.3   8.4      12: CAD: left heart cath on 04/09/2022 Dr. 4.08; no significant stenosis. Discussed healthy diet 13: Hyperlipidemia:  continue Lipitor 80 mg daily, Zetia 10 mg daily 14: PAD: seen in hospital consultation by Dr. Lenell AntuHawken 04/09/2022: follow-up at discharge for abnormal left ABI             --continue Plavix and statin 15: Tobacco use: Recently stopped and is not having cravings 16: Obesity: dietary counseling 17: Left breat mass: s/p lumpectomy 03/25/2022>>non-malignant.  Incision healing. 18: Food allergies: citrus/cucumber/apple juice/tree nuts,peanuts/pickles 19: Lactose intolerant: avoid dairy 20: GERD: Continue Protonix 21. Vitamin D deficiency: start ergocalciferol 50,000U once per week for 7 weeks. 22. Anemia: Hgb stable, monitor weekly 5/13 continue to monitor with weekly labs currently stable.    Latest Ref Rng & Units 05/04/2022    7:46 AM 05/01/2022    5:50 AM 04/30/2022    5:29 AM  CBC  WBC 4.0 - 10.5 K/uL 7.3    8.6     Hemoglobin 12.0 - 15.0 g/dL 9.6   9.1   9.0    Hematocrit 36.0 - 46.0 % 31.4   28.5   27.9    Platelets 150 - 400 K/uL 389    308      23. Insomnia: continue trazodone 50mg  HS. Change Lyrica to 75mg  HS 23. Urinary urgency: ordered bladder program 24. History of bipolar disorder: schedule outpatient with neuropsych 25. Hypomagnesemia: supplement 4grams IV today  LOS: 8 days A FACE TO FACE EVALUATION WAS PERFORMED  Drema PryKrutika P Nasrin Lanzo 05/07/2022, 12:47 PM

## 2022-05-07 NOTE — Progress Notes (Signed)
Inpatient Rehabilitation Discharge Medication Review by a Pharmacist  A complete drug regimen review was completed for this patient to identify any potential clinically significant medication issues.  High Risk Drug Classes Is patient taking? Indication by Medication  Antipsychotic Yes Compazine- nausea  Anticoagulant No   Antibiotic No   Opioid Yes Norco- acute pain  Antiplatelet Yes Plavix- PAD, CVA  Hypoglycemics/insulin Yes metformin- T2DM  Vasoactive Medication Yes Metoprolol, clonidine, nitroSL- hypertension, angina  Chemotherapy No   Other Yes Lipitor for HLD Protonix for GERD Lyrica for neuropathy/pain syndrome Magnesium- hypomagnesemia  Trazodone- sleep Zyrtec- seasonal allergies     Type of Medication Issue Identified Description of Issue Recommendation(s)  Drug Interaction(s) (clinically significant)     Duplicate Therapy     Allergy     No Medication Administration End Date     Incorrect Dose     Additional Drug Therapy Needed     Significant med changes from prior encounter (inform family/care partners about these prior to discharge). Losartan PTA To resume if and when appropriate  Other       Clinically significant medication issues were identified that warrant physician communication and completion of prescribed/recommended actions by midnight of the next day:  No   Time spent performing this drug regimen review (minutes):  30   Theodosia Bahena BS, PharmD, BCPS Clinical Pharmacist 05/08/2022 8:24 AM  Contact: [phone] after 3 PM  "Be curious, not judgmental..." -Jamal Maes

## 2022-05-08 ENCOUNTER — Other Ambulatory Visit (HOSPITAL_COMMUNITY): Payer: Self-pay

## 2022-05-08 DIAGNOSIS — I6310 Cerebral infarction due to embolism of unspecified precerebral artery: Secondary | ICD-10-CM

## 2022-05-08 LAB — BASIC METABOLIC PANEL
Anion gap: 8 (ref 5–15)
BUN: 21 mg/dL — ABNORMAL HIGH (ref 6–20)
CO2: 24 mmol/L (ref 22–32)
Calcium: 8.3 mg/dL — ABNORMAL LOW (ref 8.9–10.3)
Chloride: 108 mmol/L (ref 98–111)
Creatinine, Ser: 1.06 mg/dL — ABNORMAL HIGH (ref 0.44–1.00)
GFR, Estimated: >60 mL/min (ref >60)
Glucose, Bld: 146 mg/dL — ABNORMAL HIGH (ref 70–99)
Potassium: 4.4 mmol/L (ref 3.5–5.1)
Sodium: 140 mmol/L (ref 135–145)

## 2022-05-08 LAB — MAGNESIUM: Magnesium: 1.4 mg/dL — ABNORMAL LOW (ref 1.7–2.4)

## 2022-05-08 LAB — GLUCOSE, CAPILLARY: Glucose-Capillary: 98 mg/dL (ref 70–99)

## 2022-05-08 MED ORDER — METFORMIN HCL 500 MG PO TABS: 500.0000 mg | ORAL_TABLET | Freq: Two times a day (BID) | ORAL | 0 refills | Status: DC

## 2022-05-08 MED ORDER — MAGNESIUM SULFATE 2 GM/50ML IV SOLN
2.0000 g | Freq: Once | INTRAVENOUS | Status: DC
  Filled 2022-05-08: qty 50

## 2022-05-08 NOTE — TOC Benefit Eligibility Note (Signed)
Patient Scientific laboratory technician completed.     The patient is currently admitted and upon discharge could be taking FARXIGA 10 MG.   The current 30 day co-pay is, $0.   The patient is insured through [employer].

## 2022-05-08 NOTE — Progress Notes (Signed)
PROGRESS NOTE   Subjective/Complaints: No new complaints this morning She discusses how her left lower extremity pain greatly limits her ability to walk at home. Before this she would use the gym,   ROS: Positive for LLE pain and numbness- still severe.  This is mostly in her toes of her left foot, +urinary urgency, +anxiety, +depression, +insomnia  Objective:   No results found. No results for input(s): WBC, HGB, HCT, PLT in the last 72 hours.  Recent Labs    05/07/22 0522 05/08/22 0526  NA 139 140  K 3.9 4.4  CL 107 108  CO2 23 24  GLUCOSE 132* 146*  BUN 21* 21*  CREATININE 1.32* 1.06*  CALCIUM 7.9* 8.3*     Intake/Output Summary (Last 24 hours) at 05/08/2022 0957 Last data filed at 05/08/2022 0845 Gross per 24 hour  Intake 1155 ml  Output --  Net 1155 ml        Physical Exam: Vital Signs Blood pressure (!) 142/82, pulse 90, temperature 97.8 F (36.6 C), temperature source Oral, resp. rate 20, height 5\' 2"  (1.575 m), weight 90.9 kg, SpO2 99 %. Gen: no distress, normal appearing, BMI 36.65 HEENT: oral mucosa pink and moist, NCAT Cardio: regular HR Chest: normal effort, normal rate of breathing Abd: soft, non-distended, +BS all quadrants Ext: no edema Psych: depressed mood. Anxious Skin: intact Neuro:  alert and oriented, No aphasia.  Cranial nerves II through XII intact other than decreased facial sensation on the left side can perform finger-to-nose but slow to initiate decree sensation left second toe through fifth toes able to follow one-step commands.  Fluent language but some delayed responses. Musculoskeletal: Tone is normal no joint swelling noted.  IV in both arms 5/5 in RUE and RLE 4-4+/5 in LLE.  Finger flexion L UE at least 4/5 no shoulder abduction noted appears not limited by pain or effort.  Able to hold elbow flexed against gravity.  Minimal active elbow extension appears to be able to hold arm  extended when sitting Limited testing due to limited effort or pain during examination.  Tenderness throughout proximal LUE and pain with passive ROM at elbow and shoulder. +slump test left   Assessment/Plan: 1. Functional deficits which require 3+ hours per day of interdisciplinary therapy in a comprehensive inpatient rehab setting. Physiatrist is providing close team supervision and 24 hour management of active medical problems listed below. Physiatrist and rehab team continue to assess barriers to discharge/monitor patient progress toward functional and medical goals  Care Tool:  Bathing    Body parts bathed by patient: Right arm, Chest, Left arm, Abdomen, Front perineal area, Buttocks, Right upper leg, Left upper leg, Right lower leg, Left lower leg, Face         Bathing assist Assist Level: Set up assist     Upper Body Dressing/Undressing Upper body dressing   What is the patient wearing?: Bra, Dress    Upper body assist Assist Level: Minimal Assistance - Patient > 75%    Lower Body Dressing/Undressing Lower body dressing      What is the patient wearing?: Underwear/pull up     Lower body assist Assist for lower body dressing:  Supervision/Verbal cueing     Toileting Toileting    Toileting assist Assist for toileting: Minimal Assistance - Patient > 75% Assistive Device Comment: walker   Transfers Chair/bed transfer  Transfers assist     Chair/bed transfer assist level: Independent with assistive device Chair/bed transfer assistive device: Arboriculturist assist      Assist level: Supervision/Verbal cueing Assistive device: Walker-rolling Max distance: 164ft   Walk 10 feet activity   Assist     Assist level: Supervision/Verbal cueing Assistive device: Walker-rolling   Walk 50 feet activity   Assist Walk 50 feet with 2 turns activity did not occur: Safety/medical concerns (decreased endurnace without  device)  Assist level: Supervision/Verbal cueing Assistive device: Walker-rolling    Walk 150 feet activity   Assist Walk 150 feet activity did not occur: Safety/medical concerns  Assist level: Supervision/Verbal cueing Assistive device: Walker-rolling    Walk 10 feet on uneven surface  activity   Assist     Assist level: Contact Guard/Touching assist Assistive device: Walker-rolling   Wheelchair     Assist Is the patient using a wheelchair?: No Type of Wheelchair: Manual Wheelchair activity did not occur: N/A  Wheelchair assist level: Dependent - Patient 0%      Wheelchair 50 feet with 2 turns activity    Assist    Wheelchair 50 feet with 2 turns activity did not occur: N/A   Assist Level: Dependent - Patient 0%   Wheelchair 150 feet activity     Assist  Wheelchair 150 feet activity did not occur: N/A   Assist Level: Dependent - Patient 0%   Blood pressure (!) 142/82, pulse 90, temperature 97.8 F (36.6 C), temperature source Oral, resp. rate 20, height 5\' 2"  (1.575 m), weight 90.9 kg, SpO2 99 %.  Medical Problem List and Plan: 1. Functional deficits secondary to right pontine infarct and punctate left hippocampus and frontal white matter infarcts. Neurology suspects cardioembolic vs small vessel disease source. 30 Day cardionet monitoring to rule out afib             -patient may shower             -ELOS/Goals: 12-14 days Sup to Mod I with PT/OT/SLP  D/c home today 2.  Antithrombotics: -DVT/anticoagulation:  Pharmaceutical: Heparin             -antiplatelet therapy: Plavix. Allergy to aspirin 5/13 Plavix alone due to aspirin allergy. 3. Pain Management: Tylenol as needed -Chronic pain syndrome on Xtampza not restarted on admission -continue Lyrica 50 mg daily -restarted Norco 5mg  TID prn 5/13 continue pain management regimen: Lyrica, Norco, Xtampza 5/14 Pain controlled with current regimen. 4. Anxiety: LCSW to evaluate and provide  emotional support             -antipsychotic agents: n/a             -anxiety: continue home Xanax 2mg  BID as needed.               -patient reported history of bipolar disorder: continue trazodone 150 mg q HS.  She is agreeable to neuropsychiatric evaluation Add magnesium gluconate 250mg  HS 5. Decreased safety awareness: Telesitter is ordered. This patient is capable of making decisions on her own behalf. 6. Skin/Wound Care: Routine skin care checks 7. Fluids/Electrolytes/Nutrition: Routine I's and O's and follow-up chemistries 8: Hypertension: monitor TID/prn            --continue Lopressor 50 mg BID           -  BP overall has been well controlled 5/13 blood pressure systolic has been mostly in the 140s we will continue to trend May consider increasing Lopressor dose. 5/14 Trial of increased metoprolol dose of 100 mg BID from 50 mg BID, BP 157/92, HR 110.  Home lorsartan 25 mg has not been given inpatient, with slightly elevated Crt 1.41. Add magnesium gluconate  HS Add home clonidine 0.1mg  BID     05/08/2022    4:16 AM 05/07/2022    8:32 PM 05/07/2022    2:19 PM  Vitals with BMI  Systolic 142 136 161  Diastolic 82 93 58  Pulse 90 95 101    9: Chronic systolic heart failure: no diuretics            -Continue metoprolol/losartan/farxiga, consider Entresto if BP tolerates -5/14 This admission pt has been given metoprolol and Farxiga but not losartan (Crt 1.41). Continue to trend Crt with qMonday labs before starting ARB.  10: DM-2: monitor CBGs, carb modified diet             --continue Farxiga 5 mg daily             --continue metformin 500 mg BID             --continue SSI 0-15u             -CBGs in 200s-260s, consider increase metformin if remains elevated, discussed diet 5/13 patient has been very noncompliant with her diet with note from nurse that she ate 2 trays at lunch and 2 trays at dinner of very high sugar and very high carbohydrate rate content.  She also had 2 spikes  of elevated CBGs at 335 on 5/12, and 411 on 4/11.  She takes metformin 500 mg twice daily at home but on inspection it does not look like she has been taking this in the hospital.  Spoke to pharmacy today and Gerline Legacy.D. recommended resuming her at home dose of metformin in addition to [place] 10 mg daily.  We will see if her blood sugars equilibrate at a lower level with these changes. Increase metformin to 1,000mg  BID  CBG (last 3)  Recent Labs    05/07/22 1659 05/07/22 2057 05/08/22 0530  GLUCAP 130* 195* 98    11: CKD 3b: Elevated BUN and creatinine.  Encourage p.o. fluids and follow-up BMP. 5/13 BUN has trended down some from 22-15, creatinine has trended down from 1.65-1.41 continue to encourage p.o. intake and will continue monitoring BMP. Monitor creatinine with increase in metformin back to home dose.     Latest Ref Rng & Units 05/08/2022    5:26 AM 05/07/2022    5:22 AM 05/04/2022    7:46 AM  BMP  Glucose 70 - 99 mg/dL 096   045   409    BUN 6 - 20 mg/dL Creatinine 0.44 - 1.00 mg/dL 8.11   9.14   7.82    Sodium 135 - 145 mmol/L 140   139   136    Potassium 3.5 - 5.1 mmol/L 4.4   3.9   4.0    Chloride 98 - 111 mmol/L 108   107   104    CO2 22 - 32 mmol/L Calcium 8.9 - 10.3 mg/dL 8.3   7.9   8.3      12: CAD: left heart cath on 04/09/2022 Dr. Algie Coffer; no  significant stenosis. Discussed healthy diet 13: Hyperlipidemia: continue Lipitor 80 mg daily, Zetia 10 mg daily 14: PAD: seen in hospital consultation by Dr. Lenell Antu 04/09/2022: follow-up at discharge for abnormal left ABI             --continue Plavix and statin 15: Tobacco use: Recently stopped and is not having cravings 16: Obesity: dietary counseling 17: Left breat mass: s/p lumpectomy 03/25/2022>>non-malignant.  Incision healing. 18: Food allergies: citrus/cucumber/apple juice/tree nuts,peanuts/pickles 19: Lactose intolerant: avoid dairy 20: GERD: Continue Protonix 21. Vitamin D  deficiency: start ergocalciferol 50,000U once per week for 7 weeks. 22. Anemia: Hgb stable, monitor weekly 5/13 continue to monitor with weekly labs currently stable.    Latest Ref Rng & Units 05/04/2022    7:46 AM 05/01/2022    5:50 AM 04/30/2022    5:29 AM  CBC  WBC 4.0 - 10.5 K/uL 7.3    8.6    Hemoglobin 12.0 - 15.0 g/dL 9.6   9.1   9.0    Hematocrit 36.0 - 46.0 % 31.4   28.5   27.9    Platelets 150 - 400 K/uL 389    308      23. Insomnia: continue trazodone 50mg  HS. Change Lyrica to 75mg  HS. Discussed adding topamax if above is not helpful.  23. Urinary urgency: ordered bladder program 24. History of bipolar disorder: schedule outpatient with neuropsych, discussed with patient 25. Hypomagnesemia: supplement 4grams IV on 5/18, continue oral supplement   >30 minutes spent in discharge of patient including review of medications and follow-up appointments, physical examination, and in answering all patient's questions   LOS: 9 days A FACE TO FACE EVALUATION WAS PERFORMED  Jiovany Scheffel 05/08/2022, 9:57 AM

## 2022-05-08 NOTE — Progress Notes (Signed)
Patient ID: Erin Jackson, female   DOB: 09/11/1966, 56 y.o.   MRN: [id number]  This SW covering for primary SW, Becky Dupree.   SW met with pt in room to confirm transportation. Reports Medicaid transportation will be here at 10am. Confirms RW has been received. SW updated pt primary RN/aide on above.   [name], MSW, LCSWA Office: [phone] Cell: [phone] Fax: [phone]  

## 2022-05-12 ENCOUNTER — Telehealth: Payer: Self-pay

## 2022-05-12 NOTE — Telephone Encounter (Signed)
Transitional Care call-Patient    Are you/is patient experiencing any problems since coming home? Leg is hurting worse. Can't sleep due to numbness in leg Are there any questions regarding any aspect of care? No Are there any questions regarding medications administration/dosing? No Are meds being taken as prescribed? Yes Patient should review meds with caller to confirm Have there been any falls? No Has Home Health been to the house and/or have they contacted you? Yes If not, have you tried to contact them? Can we help you contact them? Are bowels and bladder emptying properly? Yes Are there any unexpected incontinence issues? No If applicable, is patient following bowel/bladder programs? Any fevers, problems with breathing, unexpected pain? No Are there any skin problems or new areas of breakdown? No Has the patient/family member arranged specialty MD follow up (ie cardiology/neurology/renal/surgical/etc)?  Yes Can we help arrange? Does the patient need any other services or support that we can help arrange? No Are caregivers following through as expected in assisting the patient? Yes Has the patient quit smoking, drinking alcohol, or using drugs as recommended? Yes  Appointment time, arrive time and who it is with here suppose to be with Dr. Natale Lay but patient said she wanted to see Dr. Carlis Abbott and no one else. Patient will call back to schedule with Dr. Carlis Abbott. [id number] [employer]

## 2022-05-14 ENCOUNTER — Other Ambulatory Visit: Payer: Self-pay | Admitting: *Deleted

## 2022-05-14 ENCOUNTER — Encounter: Payer: Medicare Other | Admitting: Internal Medicine

## 2022-05-14 DIAGNOSIS — I739 Peripheral vascular disease, unspecified: Secondary | ICD-10-CM

## 2022-05-19 ENCOUNTER — Encounter: Payer: Self-pay | Admitting: Physical Medicine & Rehabilitation

## 2022-05-19 ENCOUNTER — Encounter: Payer: Medicare Other | Attending: Physical Medicine & Rehabilitation | Admitting: Physical Medicine & Rehabilitation

## 2022-05-19 VITALS — BP 160/89 | HR 83 | Ht 62.0 in | Wt 211.8 lb

## 2022-05-19 DIAGNOSIS — R0789 Other chest pain: Secondary | ICD-10-CM | POA: Diagnosis not present

## 2022-05-19 DIAGNOSIS — I6350 Cerebral infarction due to unspecified occlusion or stenosis of unspecified cerebral artery: Secondary | ICD-10-CM

## 2022-05-19 DIAGNOSIS — F17200 Nicotine dependence, unspecified, uncomplicated: Secondary | ICD-10-CM

## 2022-05-19 DIAGNOSIS — E1169 Type 2 diabetes mellitus with other specified complication: Secondary | ICD-10-CM | POA: Diagnosis not present

## 2022-05-19 DIAGNOSIS — I10 Essential (primary) hypertension: Secondary | ICD-10-CM

## 2022-05-19 MED ORDER — LIDOCAINE 5 % EX PTCH
1.0000 | MEDICATED_PATCH | CUTANEOUS | Status: DC
Start: 2022-05-19 — End: 2022-07-27

## 2022-05-19 MED ORDER — METHOCARBAMOL 500 MG PO TABS
500.0000 mg | ORAL_TABLET | Freq: Four times a day (QID) | ORAL | 0 refills | Status: DC | PRN
Start: 2022-05-19 — End: 2022-07-27

## 2022-05-19 NOTE — Progress Notes (Signed)
Subjective:    Patient ID: Erin Jackson, female    DOB: 07-02-1966, 56 y.o.   MRN: [id number]  HPI Brief HPI:   Erin Jackson is a 56 y.o. female who presented to the emergency department complaining of generalized weakness, dizziness, headache and shortness of breath.  She was found to have elevated systolic blood pressure into the 200s and persistent tachycardia in the 130s.  I am increasingly more unresponsive she was started on nicardipine drip critical care medicine consulted.  Encephalopathy was felt most likely due to malignant hypertension versus PRES.  The following day she endorsed right facial numbness and left hand and foot numbness.  Imaging was significant for hypodensity of the right pons and she was started on antiplatelet therapy with Plavix.  Her MRI was consistent with right pontine infarct and punctate left hypocampus and frontal white matter infarcts.     Hospital Course: Erin Jackson was admitted to rehab 04/29/2022 for inpatient therapies to consist of PT, ST and OT at least three hours five days a week. Past admission physiatrist, therapy team and rehab RN have worked together to provide customized collaborative inpatient rehab.  The patient remained quite dissatisfied with diet restrictions, particularly seasonings.  We discussed salt and carbohydrate intake and over time how not adhering to these restrictions may increase stroke risk.  She relayed how her blood sugars have historically had high and low swings.  We decided to ease the dietary restrictions and continue to closely monitor and ask her to pay attention to appropriate food choices.  Developed bothersome urinary incontinence on 5/15 and bladder program was initiated.  She also underwent neuropsychology evaluation on that day. Magnesium gluconate 250 mg started at bedtime on and restarted clonidine 0.1 mg BID. Lyrica increased to 75 mg daily. Patient is refusing prescriptions for [place] and Zetia. Refused further  magnesium IV on 5/19.     Blood pressures were monitored on TID basis and remained well controlled on Lopressor 50 mg twice daily.  This dose was increased to 100 mg twice daily on 5/14. Home clonidine 0.1 mg BID started on 5/17.   Diabetes has been monitored with ac/hs CBG checks and SSI was use prn for tighter BS control.  Metformin 500 mg twice daily as well as Farxiga 5 mg daily continued. Metformin increase to 1000 mg BID.     Rehab course: During patient's stay in rehab weekly team conferences were held to monitor patient's progress, set goals and discuss barriers to discharge. At admission, patient required supervision/verbal cuing, min assist for mobility and transfers.   She  has had improvement in activity tolerance, balance, postural control as well as ability to compensate for deficits. She has had improvement in functional use RUE/LUE  and RLE/LLE as well as improvement in awareness   Discussed possible use of Topamax to help with lower extremity pain. Will continue Lyrica at night at time of discharge and she will contact Dr. Ranell Patrick should she decide she needs this additional medicine.   HPI interval history 56 year old female with past medical history of diabetes mellitus type 2 bipolar disorder hypertension stage IIIb CKD chronic systolic heart failure, PAD, obesity with with recent right pontine CVA and punctate left hippocampal and frontal white matter infarcts here for follow-up after her recent rehabilitation at [place].  Patient reports she has most of her medications although she is not able to provide their names.  She does report that she is running low on the Robaxin.  She reports working with PT and OT at home with continued improvement.  She reports she is continuing her PPI for GERD.  She reports she is having more swelling in her arms and legs since her discharge.  She could not recall if she is taking Lyrica or not.  She also reports some chest wall tenderness for the past  several days.  Her blood pressure is elevated but she says she did not take her BP medications this morning.  She reports her glucose has been ranging from 110-170.  She reports she will follow-up with her new PCP tomorrow.  She would like a rollator with a seat because she feels like she gets fatigued during activities and needs to sit down.      05/19/2022   10:25 AM 05/08/2022    4:16 AM 05/07/2022    8:32 PM  Vitals with BMI  Height 5\' 2"     Weight 211 lbs 13 oz    BMI 123456    Systolic 0000000 [id number]  Diastolic 89 82 93  Pulse 83 90 95     Pain Inventory Average Pain 9 Pain Right Now 8 My pain is constant, sharp, burning, stabbing, tingling, and aching  LOCATION OF PAIN  neck, shoulder, back, hip, thigh, leg, ankle, toes  BOWEL Number of stools per week: 7  BLADDER Normal  Mobility use a walker how many minutes can you walk? 2-3 minutes ability to climb steps?  no do you drive?  no  Function Do you have any goals in this area?  no  Neuro/Psych numbness tingling trouble walking dizziness depression anxiety  Prior Studies Any changes since last visit?  no  Physicians involved in your care Any changes since last visit?  no   Family History  Problem Relation Age of Onset   Breast cancer Maternal Grandmother    Breast cancer Maternal Aunt    Social History   Socioeconomic History   Marital status: Divorced    Spouse name: Not on file   Number of children: Not on file   Years of education: Not on file   Highest education level: Not on file  Occupational History   Occupation: disabled  Tobacco Use   Smoking status: Every Day    Packs/day: 0.50    Types: Cigarettes   Smokeless tobacco: Never  Vaping Use   Vaping Use: Never used  Substance and Sexual Activity   Alcohol use: Yes    Comment: socially   Drug use: Yes    Types: Marijuana    Comment: occasional   Sexual activity: Never  Other Topics Concern   Not on file  Social History Narrative    Not on file   Social Determinants of Health   Financial Resource Strain: Not on file  Food Insecurity: Not on file  Transportation Needs: Not on file  Physical Activity: Not on file  Stress: Not on file  Social Connections: Not on file   Past Surgical History:  Procedure Laterality Date   ABDOMINAL HYSTERECTOMY     BREAST LUMPECTOMY Left 03/25/2022   Procedure: LEFT BREAST LUMPECTOMY;  Surgeon: Jovita Kussmaul, MD;  Location: [place];  Service: General;  Laterality: Left;   BREAST REDUCTION SURGERY Bilateral 08/15/2018   Procedure: BILATERAL MAMMARY REDUCTION  (BREAST);  Surgeon: Cristine Polio, MD;  Location: [place];  Service: Plastics;  Laterality: Bilateral;   BREAST REDUCTION SURGERY Left 09/25/2019   Procedure: EXCISION OF FAT NECROSIS OF LEFT  BREAST;  Surgeon: Cristine Polio, MD;  Location: [place];  Service: Plastics;  Laterality: Left;   CARPAL TUNNEL RELEASE Right 06/17/2020   right thumb CMC arthroplasty with tendon transfer and right carpal tunnel release.    CERVICAL SPINE SURGERY  2015   has a metal plate   FOOT SURGERY Left 2014   HAND SURGERY     LEFT HEART CATH AND CORONARY ANGIOGRAPHY N/A 04/09/2022   Procedure: LEFT HEART CATH AND CORONARY ANGIOGRAPHY;  Surgeon: Dixie Dials, MD;  Location: [facility];  Service: Cardiovascular;  Laterality: N/A;   REDUCTION MAMMAPLASTY Bilateral 07/2018   Past Medical History:  Diagnosis Date   Anxiety    Chronic back pain    Diabetes mellitus without complication (HCC)    GERD (gastroesophageal reflux disease)    Hypertension    BP (!) 160/89 Comment: per patient no medication taken today  Pulse 83   Ht 5\' 2"  (1.575 m)   Wt 211 lb 12.8 oz (96.1 kg)   SpO2 96%   BMI 38.74 kg/m   Opioid Risk Score:   Fall Risk Score:  `1  Depression screen [facility] 2/9     05/19/2022   10:38 AM 08/20/2017    9:29 AM  Depression screen PHQ 2/9  Decreased Interest 1 0   Down, Depressed, Hopeless 2 0  PHQ - 2 Score 3 0  Altered sleeping 3   Tired, decreased energy 0   Change in appetite 0   Feeling bad or failure about yourself  1   Trouble concentrating 3   Moving slowly or fidgety/restless 3   Suicidal thoughts 0   PHQ-9 Score 13     Review of Systems  Constitutional:  Positive for unexpected weight change.       Weight gain  Respiratory:  Positive for cough and shortness of breath.   Cardiovascular:  Positive for leg swelling.  Musculoskeletal:  Positive for back pain, gait problem and neck pain.  Neurological:  Positive for dizziness.       Tingling  Psychiatric/Behavioral:         Depression, anxiety  All other systems reviewed and are negative.     Objective:   Physical Exam     05/19/2022   10:25 AM 05/08/2022    4:16 AM 05/07/2022    8:32 PM  Vitals with BMI  Height 5\' 2"     Weight 211 lbs 13 oz    BMI 123456    Systolic 0000000 [id number]  Diastolic 89 82 93  Pulse 83 90 95    Gen: no distress, normal appearing HEENT: oral mucosa pink and moist, NCAT Cardio: Reg rate Chest: normal effort, normal rate of breathing Abd: soft, non-distended Ext: Trace edema in bilateral lower extremities Psych: pleasant, normal affect Skin: intact Neuro: Alert and oriented no aphasia.  Cranial nerves II through XII intact.  Follows commands answers questions Musculoskeletal: Strength 5 out of 5 in the right upper extremity and right lower extremity Strength 4+ in left upper extremity, strength 4 to 4+ out of 5 in left lower extremity Tenderness to palpation of her chest wall on the left that reproduces her pain. No joint swelling noted        Assessment & Plan:   Right pontine infarct and punctate left hippocampus and frontal white matter infarcts. Neurology suspects cardioembolic vs small vessel disease source. 30 Day cardionet monitoring to rule out afib -Continue home PT OT -Continue Plavix -We will refill  Robaxin for muscle  spasms -Continue statin -Order placed for rollator with a seat  2. Chronic pain -She is currently on hydrocodone from a outside pain provider -We will discontinue her Lyrica, this appears to be a recently added medication, possibly causing some of her swelling, also unsure if it is providing any benefit   3. Hypertension elevated blood pressure today, she did not take her medications -Continue clonidine, metoprolol -Advised her to bring her medications to her PCP tomorrow, discussed importance of taking all her medications   4. Diabetes mellitus type 2 -Continue metformin -Discussed importance of diet -Advised her to follow-up with her PCP, bring all your medications tomorrow to have your PCP with glucose logs if possible  5. CAD -Advised to go to the ED if she develops new or worsening chest pain   6. PAD: seen in hospital consultation by Dr. Stanford Breed 04/09/2022 -Advised her to continue her outpatient follow-up  7: Tobacco use -Reports she is smoking 1 cigarette a day.  I advised her to quit.  I offered her medications to help quit but she declined.         8. chest wall pain -Suspect from PT and use of walker -We will order lidocaine patch

## 2022-05-20 ENCOUNTER — Ambulatory Visit: Payer: Medicare Other | Admitting: Internal Medicine

## 2022-05-21 ENCOUNTER — Encounter: Payer: Medicare Other | Admitting: Physical Medicine & Rehabilitation

## 2022-05-21 DIAGNOSIS — R3915 Urgency of urination: Secondary | ICD-10-CM | POA: Diagnosis not present

## 2022-05-21 DIAGNOSIS — E559 Vitamin D deficiency, unspecified: Secondary | ICD-10-CM | POA: Diagnosis not present

## 2022-05-21 DIAGNOSIS — Z7984 Long term (current) use of oral hypoglycemic drugs: Secondary | ICD-10-CM | POA: Diagnosis not present

## 2022-05-21 DIAGNOSIS — N1832 Chronic kidney disease, stage 3b: Secondary | ICD-10-CM | POA: Diagnosis not present

## 2022-05-21 DIAGNOSIS — E1122 Type 2 diabetes mellitus with diabetic chronic kidney disease: Secondary | ICD-10-CM | POA: Diagnosis not present

## 2022-05-21 DIAGNOSIS — K219 Gastro-esophageal reflux disease without esophagitis: Secondary | ICD-10-CM | POA: Diagnosis not present

## 2022-05-21 DIAGNOSIS — I5022 Chronic systolic (congestive) heart failure: Secondary | ICD-10-CM | POA: Diagnosis not present

## 2022-05-21 DIAGNOSIS — G894 Chronic pain syndrome: Secondary | ICD-10-CM | POA: Diagnosis not present

## 2022-05-21 DIAGNOSIS — Z9181 History of falling: Secondary | ICD-10-CM | POA: Diagnosis not present

## 2022-05-21 DIAGNOSIS — I130 Hypertensive heart and chronic kidney disease with heart failure and stage 1 through stage 4 chronic kidney disease, or unspecified chronic kidney disease: Secondary | ICD-10-CM | POA: Diagnosis not present

## 2022-05-21 DIAGNOSIS — I69311 Memory deficit following cerebral infarction: Secondary | ICD-10-CM | POA: Diagnosis not present

## 2022-05-21 DIAGNOSIS — I2510 Atherosclerotic heart disease of native coronary artery without angina pectoris: Secondary | ICD-10-CM | POA: Diagnosis not present

## 2022-05-21 DIAGNOSIS — E785 Hyperlipidemia, unspecified: Secondary | ICD-10-CM | POA: Diagnosis not present

## 2022-05-21 DIAGNOSIS — D631 Anemia in chronic kidney disease: Secondary | ICD-10-CM | POA: Diagnosis not present

## 2022-05-21 DIAGNOSIS — G4700 Insomnia, unspecified: Secondary | ICD-10-CM | POA: Diagnosis not present

## 2022-05-21 DIAGNOSIS — Z7902 Long term (current) use of antithrombotics/antiplatelets: Secondary | ICD-10-CM | POA: Diagnosis not present

## 2022-05-21 DIAGNOSIS — Z79891 Long term (current) use of opiate analgesic: Secondary | ICD-10-CM | POA: Diagnosis not present

## 2022-05-21 DIAGNOSIS — I255 Ischemic cardiomyopathy: Secondary | ICD-10-CM | POA: Diagnosis not present

## 2022-05-21 DIAGNOSIS — E1140 Type 2 diabetes mellitus with diabetic neuropathy, unspecified: Secondary | ICD-10-CM | POA: Diagnosis not present

## 2022-05-21 DIAGNOSIS — E1151 Type 2 diabetes mellitus with diabetic peripheral angiopathy without gangrene: Secondary | ICD-10-CM | POA: Diagnosis not present

## 2022-05-21 DIAGNOSIS — I69351 Hemiplegia and hemiparesis following cerebral infarction affecting right dominant side: Secondary | ICD-10-CM | POA: Diagnosis not present

## 2022-05-26 ENCOUNTER — Ambulatory Visit (HOSPITAL_COMMUNITY)
Admission: RE | Admit: 2022-05-26 | Discharge: 2022-05-26 | Disposition: A | Discharge status: Home / Self Care | Payer: Medicare Other | Source: Ambulatory Visit | Attending: Vascular Surgery | Admitting: Vascular Surgery

## 2022-05-26 ENCOUNTER — Ambulatory Visit: Payer: Medicare Other | Admitting: Vascular Surgery

## 2022-05-26 DIAGNOSIS — I739 Peripheral vascular disease, unspecified: Secondary | ICD-10-CM

## 2022-05-27 DIAGNOSIS — Z7902 Long term (current) use of antithrombotics/antiplatelets: Secondary | ICD-10-CM | POA: Diagnosis not present

## 2022-05-27 DIAGNOSIS — I255 Ischemic cardiomyopathy: Secondary | ICD-10-CM | POA: Diagnosis not present

## 2022-05-27 DIAGNOSIS — E1151 Type 2 diabetes mellitus with diabetic peripheral angiopathy without gangrene: Secondary | ICD-10-CM | POA: Diagnosis not present

## 2022-05-27 DIAGNOSIS — I69311 Memory deficit following cerebral infarction: Secondary | ICD-10-CM | POA: Diagnosis not present

## 2022-05-27 DIAGNOSIS — K219 Gastro-esophageal reflux disease without esophagitis: Secondary | ICD-10-CM | POA: Diagnosis not present

## 2022-05-27 DIAGNOSIS — I130 Hypertensive heart and chronic kidney disease with heart failure and stage 1 through stage 4 chronic kidney disease, or unspecified chronic kidney disease: Secondary | ICD-10-CM | POA: Diagnosis not present

## 2022-05-27 DIAGNOSIS — E559 Vitamin D deficiency, unspecified: Secondary | ICD-10-CM | POA: Diagnosis not present

## 2022-05-27 DIAGNOSIS — I5022 Chronic systolic (congestive) heart failure: Secondary | ICD-10-CM | POA: Diagnosis not present

## 2022-05-27 DIAGNOSIS — Z7984 Long term (current) use of oral hypoglycemic drugs: Secondary | ICD-10-CM | POA: Diagnosis not present

## 2022-05-27 DIAGNOSIS — Z79891 Long term (current) use of opiate analgesic: Secondary | ICD-10-CM | POA: Diagnosis not present

## 2022-05-27 DIAGNOSIS — G894 Chronic pain syndrome: Secondary | ICD-10-CM | POA: Diagnosis not present

## 2022-05-27 DIAGNOSIS — E785 Hyperlipidemia, unspecified: Secondary | ICD-10-CM | POA: Diagnosis not present

## 2022-05-27 DIAGNOSIS — G4700 Insomnia, unspecified: Secondary | ICD-10-CM | POA: Diagnosis not present

## 2022-05-27 DIAGNOSIS — I2510 Atherosclerotic heart disease of native coronary artery without angina pectoris: Secondary | ICD-10-CM | POA: Diagnosis not present

## 2022-05-27 DIAGNOSIS — R3915 Urgency of urination: Secondary | ICD-10-CM | POA: Diagnosis not present

## 2022-05-27 DIAGNOSIS — Z9181 History of falling: Secondary | ICD-10-CM | POA: Diagnosis not present

## 2022-05-27 DIAGNOSIS — E1122 Type 2 diabetes mellitus with diabetic chronic kidney disease: Secondary | ICD-10-CM | POA: Diagnosis not present

## 2022-05-27 DIAGNOSIS — E1140 Type 2 diabetes mellitus with diabetic neuropathy, unspecified: Secondary | ICD-10-CM | POA: Diagnosis not present

## 2022-05-27 DIAGNOSIS — I69351 Hemiplegia and hemiparesis following cerebral infarction affecting right dominant side: Secondary | ICD-10-CM | POA: Diagnosis not present

## 2022-05-27 DIAGNOSIS — D631 Anemia in chronic kidney disease: Secondary | ICD-10-CM | POA: Diagnosis not present

## 2022-05-27 DIAGNOSIS — N1832 Chronic kidney disease, stage 3b: Secondary | ICD-10-CM | POA: Diagnosis not present

## 2022-05-28 ENCOUNTER — Telehealth: Payer: Self-pay | Admitting: *Deleted

## 2022-05-28 NOTE — Telephone Encounter (Signed)
[name] called and would like for Dr Carlis Abbott to call her. I inquired about her needs and she says she would like to talk to her about her feeling lightheaded a lot this past week-- much like the way she was feeling before she went into the hospital.

## 2022-05-28 NOTE — Telephone Encounter (Signed)
Patient called clinic again stating she would like a return phone call from Dr. Carlis Abbott. She stated it is urgent

## 2022-05-29 ENCOUNTER — Encounter: Payer: Medicare Other | Attending: Physical Medicine & Rehabilitation | Admitting: Physical Medicine and Rehabilitation

## 2022-05-29 ENCOUNTER — Other Ambulatory Visit: Payer: Self-pay

## 2022-05-29 ENCOUNTER — Emergency Department (HOSPITAL_COMMUNITY)
Admission: EM | Admit: 2022-05-29 | Discharge: 2022-05-29 | Discharge status: Against Medical Advice | Payer: Medicare Other | Source: Home / Self Care

## 2022-05-29 ENCOUNTER — Encounter (HOSPITAL_COMMUNITY): Payer: Self-pay

## 2022-05-29 ENCOUNTER — Emergency Department (HOSPITAL_COMMUNITY): Payer: Medicare Other

## 2022-05-29 DIAGNOSIS — H5462 Unqualified visual loss, left eye, normal vision right eye: Secondary | ICD-10-CM | POA: Diagnosis not present

## 2022-05-29 DIAGNOSIS — Z886 Allergy status to analgesic agent status: Secondary | ICD-10-CM | POA: Diagnosis not present

## 2022-05-29 DIAGNOSIS — Z888 Allergy status to other drugs, medicaments and biological substances status: Secondary | ICD-10-CM | POA: Diagnosis not present

## 2022-05-29 DIAGNOSIS — H538 Other visual disturbances: Secondary | ICD-10-CM | POA: Insufficient documentation

## 2022-05-29 DIAGNOSIS — G894 Chronic pain syndrome: Secondary | ICD-10-CM | POA: Diagnosis not present

## 2022-05-29 DIAGNOSIS — R531 Weakness: Secondary | ICD-10-CM | POA: Diagnosis not present

## 2022-05-29 DIAGNOSIS — F17210 Nicotine dependence, cigarettes, uncomplicated: Secondary | ICD-10-CM | POA: Diagnosis not present

## 2022-05-29 DIAGNOSIS — H5460 Unqualified visual loss, one eye, unspecified: Secondary | ICD-10-CM | POA: Diagnosis not present

## 2022-05-29 DIAGNOSIS — Z9181 History of falling: Secondary | ICD-10-CM | POA: Diagnosis not present

## 2022-05-29 DIAGNOSIS — M79602 Pain in left arm: Secondary | ICD-10-CM | POA: Insufficient documentation

## 2022-05-29 DIAGNOSIS — I69351 Hemiplegia and hemiparesis following cerebral infarction affecting right dominant side: Secondary | ICD-10-CM | POA: Diagnosis not present

## 2022-05-29 DIAGNOSIS — I69311 Memory deficit following cerebral infarction: Secondary | ICD-10-CM | POA: Diagnosis not present

## 2022-05-29 DIAGNOSIS — E1140 Type 2 diabetes mellitus with diabetic neuropathy, unspecified: Secondary | ICD-10-CM | POA: Diagnosis not present

## 2022-05-29 DIAGNOSIS — N1831 Chronic kidney disease, stage 3a: Secondary | ICD-10-CM | POA: Diagnosis not present

## 2022-05-29 DIAGNOSIS — G4700 Insomnia, unspecified: Secondary | ICD-10-CM | POA: Diagnosis not present

## 2022-05-29 DIAGNOSIS — M79662 Pain in left lower leg: Secondary | ICD-10-CM | POA: Insufficient documentation

## 2022-05-29 DIAGNOSIS — R202 Paresthesia of skin: Secondary | ICD-10-CM | POA: Insufficient documentation

## 2022-05-29 DIAGNOSIS — R42 Dizziness and giddiness: Secondary | ICD-10-CM | POA: Insufficient documentation

## 2022-05-29 DIAGNOSIS — Z7984 Long term (current) use of oral hypoglycemic drugs: Secondary | ICD-10-CM | POA: Diagnosis not present

## 2022-05-29 DIAGNOSIS — R200 Anesthesia of skin: Secondary | ICD-10-CM | POA: Insufficient documentation

## 2022-05-29 DIAGNOSIS — H547 Unspecified visual loss: Secondary | ICD-10-CM | POA: Diagnosis not present

## 2022-05-29 DIAGNOSIS — R3915 Urgency of urination: Secondary | ICD-10-CM | POA: Diagnosis not present

## 2022-05-29 DIAGNOSIS — Z5321 Procedure and treatment not carried out due to patient leaving prior to being seen by health care provider: Secondary | ICD-10-CM | POA: Insufficient documentation

## 2022-05-29 DIAGNOSIS — I255 Ischemic cardiomyopathy: Secondary | ICD-10-CM | POA: Diagnosis not present

## 2022-05-29 DIAGNOSIS — G8929 Other chronic pain: Secondary | ICD-10-CM | POA: Diagnosis not present

## 2022-05-29 DIAGNOSIS — H5440 Blindness, one eye, unspecified eye: Secondary | ICD-10-CM | POA: Insufficient documentation

## 2022-05-29 DIAGNOSIS — M316 Other giant cell arteritis: Secondary | ICD-10-CM | POA: Diagnosis not present

## 2022-05-29 DIAGNOSIS — Z91030 Bee allergy status: Secondary | ICD-10-CM | POA: Diagnosis not present

## 2022-05-29 DIAGNOSIS — Z79891 Long term (current) use of opiate analgesic: Secondary | ICD-10-CM | POA: Diagnosis not present

## 2022-05-29 DIAGNOSIS — Z7902 Long term (current) use of antithrombotics/antiplatelets: Secondary | ICD-10-CM | POA: Diagnosis not present

## 2022-05-29 DIAGNOSIS — F4321 Adjustment disorder with depressed mood: Secondary | ICD-10-CM | POA: Diagnosis not present

## 2022-05-29 DIAGNOSIS — N1832 Chronic kidney disease, stage 3b: Secondary | ICD-10-CM | POA: Diagnosis not present

## 2022-05-29 DIAGNOSIS — E785 Hyperlipidemia, unspecified: Secondary | ICD-10-CM | POA: Diagnosis not present

## 2022-05-29 DIAGNOSIS — M549 Dorsalgia, unspecified: Secondary | ICD-10-CM | POA: Diagnosis not present

## 2022-05-29 DIAGNOSIS — I160 Hypertensive urgency: Secondary | ICD-10-CM | POA: Diagnosis not present

## 2022-05-29 DIAGNOSIS — I130 Hypertensive heart and chronic kidney disease with heart failure and stage 1 through stage 4 chronic kidney disease, or unspecified chronic kidney disease: Secondary | ICD-10-CM | POA: Diagnosis not present

## 2022-05-29 DIAGNOSIS — E1151 Type 2 diabetes mellitus with diabetic peripheral angiopathy without gangrene: Secondary | ICD-10-CM | POA: Diagnosis not present

## 2022-05-29 DIAGNOSIS — E1122 Type 2 diabetes mellitus with diabetic chronic kidney disease: Secondary | ICD-10-CM | POA: Diagnosis not present

## 2022-05-29 DIAGNOSIS — T380X5A Adverse effect of glucocorticoids and synthetic analogues, initial encounter: Secondary | ICD-10-CM | POA: Diagnosis not present

## 2022-05-29 DIAGNOSIS — F411 Generalized anxiety disorder: Secondary | ICD-10-CM | POA: Insufficient documentation

## 2022-05-29 DIAGNOSIS — Z8673 Personal history of transient ischemic attack (TIA), and cerebral infarction without residual deficits: Secondary | ICD-10-CM | POA: Diagnosis not present

## 2022-05-29 DIAGNOSIS — E1165 Type 2 diabetes mellitus with hyperglycemia: Secondary | ICD-10-CM | POA: Diagnosis not present

## 2022-05-29 DIAGNOSIS — R9431 Abnormal electrocardiogram [ECG] [EKG]: Secondary | ICD-10-CM | POA: Diagnosis not present

## 2022-05-29 DIAGNOSIS — I6522 Occlusion and stenosis of left carotid artery: Secondary | ICD-10-CM | POA: Diagnosis not present

## 2022-05-29 DIAGNOSIS — E559 Vitamin D deficiency, unspecified: Secondary | ICD-10-CM | POA: Diagnosis not present

## 2022-05-29 DIAGNOSIS — I5022 Chronic systolic (congestive) heart failure: Secondary | ICD-10-CM | POA: Diagnosis not present

## 2022-05-29 DIAGNOSIS — K219 Gastro-esophageal reflux disease without esophagitis: Secondary | ICD-10-CM | POA: Diagnosis not present

## 2022-05-29 DIAGNOSIS — I25119 Atherosclerotic heart disease of native coronary artery with unspecified angina pectoris: Secondary | ICD-10-CM | POA: Diagnosis not present

## 2022-05-29 DIAGNOSIS — D631 Anemia in chronic kidney disease: Secondary | ICD-10-CM | POA: Diagnosis not present

## 2022-05-29 DIAGNOSIS — I2510 Atherosclerotic heart disease of native coronary artery without angina pectoris: Secondary | ICD-10-CM | POA: Diagnosis not present

## 2022-05-29 DIAGNOSIS — Z87891 Personal history of nicotine dependence: Secondary | ICD-10-CM | POA: Diagnosis not present

## 2022-05-29 DIAGNOSIS — Z79899 Other long term (current) drug therapy: Secondary | ICD-10-CM | POA: Diagnosis not present

## 2022-05-29 LAB — COMPREHENSIVE METABOLIC PANEL
ALT: 12 U/L (ref 0–44)
AST: 17 U/L (ref 15–41)
Albumin: 3 g/dL — ABNORMAL LOW (ref 3.5–5.0)
Alkaline Phosphatase: 59 U/L (ref 38–126)
Anion gap: 11 (ref 5–15)
BUN: 16 mg/dL (ref 6–20)
CO2: 23 mmol/L (ref 22–32)
Calcium: 7.2 mg/dL — ABNORMAL LOW (ref 8.9–10.3)
Chloride: 105 mmol/L (ref 98–111)
Creatinine, Ser: 1.24 mg/dL — ABNORMAL HIGH (ref 0.44–1.00)
GFR, Estimated: 50 mL/min — ABNORMAL LOW (ref >60)
Glucose, Bld: 184 mg/dL — ABNORMAL HIGH (ref 70–99)
Potassium: 3.8 mmol/L (ref 3.5–5.1)
Sodium: 139 mmol/L (ref 135–145)
Total Bilirubin: 0.5 mg/dL (ref 0.3–1.2)
Total Protein: 6.7 g/dL (ref 6.5–8.1)

## 2022-05-29 LAB — CBC WITH DIFFERENTIAL/PLATELET
Abs Immature Granulocytes: 0.02 10*3/uL (ref 0.00–0.07)
Basophils Absolute: 0 10*3/uL (ref 0.0–0.1)
Basophils Relative: 1 %
Eosinophils Absolute: 0.1 10*3/uL (ref 0.0–0.5)
Eosinophils Relative: 1 %
HCT: 36.8 % (ref 36.0–46.0)
Hemoglobin: 11.5 g/dL — ABNORMAL LOW (ref 12.0–15.0)
Immature Granulocytes: 0 %
Lymphocytes Relative: 52 %
Lymphs Abs: 3 10*3/uL (ref 0.7–4.0)
MCH: 28.6 pg (ref 26.0–34.0)
MCHC: 31.3 g/dL (ref 30.0–36.0)
MCV: 91.5 fL (ref 80.0–100.0)
Monocytes Absolute: 0.4 10*3/uL (ref 0.1–1.0)
Monocytes Relative: 7 %
Neutro Abs: 2.3 10*3/uL (ref 1.7–7.7)
Neutrophils Relative %: 39 %
Platelets: 280 10*3/uL (ref 150–400)
RBC: 4.02 MIL/uL (ref 3.87–5.11)
RDW: 13.9 % (ref 11.5–15.5)
WBC: 5.8 10*3/uL (ref 4.0–10.5)
nRBC: 0 % (ref 0.0–0.2)

## 2022-05-29 NOTE — Telephone Encounter (Signed)
Dr Ranell Patrick is doing a phone visit with her today.

## 2022-05-29 NOTE — ED Provider Triage Note (Signed)
Emergency Medicine Provider Triage Evaluation Note  Erin Jackson , a 56 y.o. female  was evaluated in triage.  Pt complains of worsening left sided deficit for the past 5-6 days.  Reports that the left side of her face is numb and tingly.  She reports some pain in her left upper and lower extremity.  Atraumatic.  She reports she is ambulatory with her walker still.  She reports that it feels the same as when she initially had her stroke although she did have some few days between her last stroke in early May and her discharge.  Denies any chest pain or shortness of breath.  Blurry vision through her left eye.  Review of Systems  Positive:  Negative:   Physical Exam  BP (!) 150/86 (BP Location: Right Arm)   Pulse 79   Temp 98.6 F (37 C) (Oral)   Resp (!) 22   SpO2 96%  Gen:   Awake, no distress   Resp:  Normal effort  MSK:   Moves extremities without difficulty  Other:  Finger-nose abnormal although do not know patient's baseline.  Cranial nerves II through XII intact.  Unable to assess pronator drift due to patient's left arm pain.  Apartments are soft.  Palpable pulses.  Medical Decision Making  Medically screening exam initiated at 11:47 AM.  Appropriate orders placed.  Erin Jackson was informed that the remainder of the evaluation will be completed by another provider, this initial triage assessment does not replace that evaluation, and the importance of remaining in the ED until their evaluation is complete.  Patient is out of stroke window.  Will order head CT and blood work.   Sherrell Puller, PA-C 05/29/22 1149

## 2022-05-29 NOTE — ED Triage Notes (Signed)
Pt arrived POV from home c/o dizziness and not being able to see out her left eye. Pt states her lip is numb as well. Pt states all of this started at the beginning of the week. Pt states she had a previous stroke and it feels like the same.

## 2022-05-31 ENCOUNTER — Encounter (HOSPITAL_COMMUNITY): Payer: Self-pay

## 2022-05-31 ENCOUNTER — Other Ambulatory Visit: Payer: Self-pay

## 2022-05-31 ENCOUNTER — Inpatient Hospital Stay (HOSPITAL_COMMUNITY)
Admission: EM | Admit: 2022-05-31 | Discharge: 2022-06-08 | DRG: 516 | Disposition: A | Discharge status: Skilled Nursing Facility | Payer: Medicare Other | Attending: Obstetrics and Gynecology | Admitting: Obstetrics and Gynecology

## 2022-05-31 ENCOUNTER — Emergency Department (HOSPITAL_COMMUNITY): Payer: Medicare Other

## 2022-05-31 DIAGNOSIS — Z79891 Long term (current) use of opiate analgesic: Secondary | ICD-10-CM | POA: Diagnosis not present

## 2022-05-31 DIAGNOSIS — F419 Anxiety disorder, unspecified: Secondary | ICD-10-CM | POA: Diagnosis present

## 2022-05-31 DIAGNOSIS — H5462 Unqualified visual loss, left eye, normal vision right eye: Secondary | ICD-10-CM | POA: Diagnosis present

## 2022-05-31 DIAGNOSIS — N1831 Chronic kidney disease, stage 3a: Secondary | ICD-10-CM | POA: Diagnosis present

## 2022-05-31 DIAGNOSIS — I5022 Chronic systolic (congestive) heart failure: Secondary | ICD-10-CM | POA: Diagnosis present

## 2022-05-31 DIAGNOSIS — E785 Hyperlipidemia, unspecified: Secondary | ICD-10-CM | POA: Diagnosis present

## 2022-05-31 DIAGNOSIS — R3915 Urgency of urination: Secondary | ICD-10-CM | POA: Diagnosis not present

## 2022-05-31 DIAGNOSIS — N1832 Chronic kidney disease, stage 3b: Secondary | ICD-10-CM | POA: Diagnosis present

## 2022-05-31 DIAGNOSIS — Z888 Allergy status to other drugs, medicaments and biological substances status: Secondary | ICD-10-CM

## 2022-05-31 DIAGNOSIS — G894 Chronic pain syndrome: Secondary | ICD-10-CM | POA: Diagnosis not present

## 2022-05-31 DIAGNOSIS — I130 Hypertensive heart and chronic kidney disease with heart failure and stage 1 through stage 4 chronic kidney disease, or unspecified chronic kidney disease: Secondary | ICD-10-CM | POA: Diagnosis present

## 2022-05-31 DIAGNOSIS — E1140 Type 2 diabetes mellitus with diabetic neuropathy, unspecified: Secondary | ICD-10-CM | POA: Diagnosis not present

## 2022-05-31 DIAGNOSIS — F17210 Nicotine dependence, cigarettes, uncomplicated: Secondary | ICD-10-CM | POA: Diagnosis present

## 2022-05-31 DIAGNOSIS — Z7984 Long term (current) use of oral hypoglycemic drugs: Secondary | ICD-10-CM

## 2022-05-31 DIAGNOSIS — G4700 Insomnia, unspecified: Secondary | ICD-10-CM | POA: Diagnosis not present

## 2022-05-31 DIAGNOSIS — E1151 Type 2 diabetes mellitus with diabetic peripheral angiopathy without gangrene: Secondary | ICD-10-CM | POA: Diagnosis not present

## 2022-05-31 DIAGNOSIS — E669 Obesity, unspecified: Secondary | ICD-10-CM | POA: Diagnosis present

## 2022-05-31 DIAGNOSIS — M316 Other giant cell arteritis: Principal | ICD-10-CM | POA: Diagnosis present

## 2022-05-31 DIAGNOSIS — E1122 Type 2 diabetes mellitus with diabetic chronic kidney disease: Secondary | ICD-10-CM | POA: Diagnosis present

## 2022-05-31 DIAGNOSIS — Z7902 Long term (current) use of antithrombotics/antiplatelets: Secondary | ICD-10-CM

## 2022-05-31 DIAGNOSIS — Z79899 Other long term (current) drug therapy: Secondary | ICD-10-CM

## 2022-05-31 DIAGNOSIS — I69351 Hemiplegia and hemiparesis following cerebral infarction affecting right dominant side: Secondary | ICD-10-CM | POA: Diagnosis not present

## 2022-05-31 DIAGNOSIS — T380X5A Adverse effect of glucocorticoids and synthetic analogues, initial encounter: Secondary | ICD-10-CM | POA: Diagnosis present

## 2022-05-31 DIAGNOSIS — E559 Vitamin D deficiency, unspecified: Secondary | ICD-10-CM | POA: Diagnosis not present

## 2022-05-31 DIAGNOSIS — Z9181 History of falling: Secondary | ICD-10-CM | POA: Diagnosis not present

## 2022-05-31 DIAGNOSIS — E1169 Type 2 diabetes mellitus with other specified complication: Secondary | ICD-10-CM

## 2022-05-31 DIAGNOSIS — H547 Unspecified visual loss: Secondary | ICD-10-CM

## 2022-05-31 DIAGNOSIS — K219 Gastro-esophageal reflux disease without esophagitis: Secondary | ICD-10-CM | POA: Diagnosis not present

## 2022-05-31 DIAGNOSIS — E876 Hypokalemia: Secondary | ICD-10-CM | POA: Diagnosis not present

## 2022-05-31 DIAGNOSIS — E1165 Type 2 diabetes mellitus with hyperglycemia: Secondary | ICD-10-CM | POA: Diagnosis present

## 2022-05-31 DIAGNOSIS — H5460 Unqualified visual loss, one eye, unspecified: Secondary | ICD-10-CM

## 2022-05-31 DIAGNOSIS — Z6838 Body mass index (BMI) 38.0-38.9, adult: Secondary | ICD-10-CM

## 2022-05-31 DIAGNOSIS — Z91030 Bee allergy status: Secondary | ICD-10-CM

## 2022-05-31 DIAGNOSIS — Z886 Allergy status to analgesic agent status: Secondary | ICD-10-CM

## 2022-05-31 DIAGNOSIS — I69311 Memory deficit following cerebral infarction: Secondary | ICD-10-CM | POA: Diagnosis not present

## 2022-05-31 DIAGNOSIS — M549 Dorsalgia, unspecified: Secondary | ICD-10-CM | POA: Diagnosis present

## 2022-05-31 DIAGNOSIS — E119 Type 2 diabetes mellitus without complications: Secondary | ICD-10-CM

## 2022-05-31 DIAGNOSIS — I2510 Atherosclerotic heart disease of native coronary artery without angina pectoris: Secondary | ICD-10-CM | POA: Diagnosis not present

## 2022-05-31 DIAGNOSIS — D631 Anemia in chronic kidney disease: Secondary | ICD-10-CM | POA: Diagnosis not present

## 2022-05-31 DIAGNOSIS — I255 Ischemic cardiomyopathy: Secondary | ICD-10-CM | POA: Diagnosis not present

## 2022-05-31 DIAGNOSIS — G8929 Other chronic pain: Secondary | ICD-10-CM | POA: Diagnosis present

## 2022-05-31 DIAGNOSIS — Z8673 Personal history of transient ischemic attack (TIA), and cerebral infarction without residual deficits: Secondary | ICD-10-CM

## 2022-05-31 DIAGNOSIS — I160 Hypertensive urgency: Secondary | ICD-10-CM | POA: Diagnosis present

## 2022-05-31 LAB — COMPREHENSIVE METABOLIC PANEL
ALT: 10 U/L (ref 0–44)
AST: 21 U/L (ref 15–41)
Albumin: 2.9 g/dL — ABNORMAL LOW (ref 3.5–5.0)
Alkaline Phosphatase: 72 U/L (ref 38–126)
Anion gap: 11 (ref 5–15)
BUN: 20 mg/dL (ref 6–20)
CO2: 20 mmol/L — ABNORMAL LOW (ref 22–32)
Calcium: 7.1 mg/dL — ABNORMAL LOW (ref 8.9–10.3)
Chloride: 107 mmol/L (ref 98–111)
Creatinine, Ser: 1.24 mg/dL — ABNORMAL HIGH (ref 0.44–1.00)
GFR, Estimated: 50 mL/min — ABNORMAL LOW (ref >60)
Glucose, Bld: 140 mg/dL — ABNORMAL HIGH (ref 70–99)
Potassium: 3.9 mmol/L (ref 3.5–5.1)
Sodium: 138 mmol/L (ref 135–145)
Total Bilirubin: 0.4 mg/dL (ref 0.3–1.2)
Total Protein: 6.5 g/dL (ref 6.5–8.1)

## 2022-05-31 LAB — DIFFERENTIAL
Abs Immature Granulocytes: 0.01 10*3/uL (ref 0.00–0.07)
Basophils Absolute: 0 10*3/uL (ref 0.0–0.1)
Basophils Relative: 0 %
Eosinophils Absolute: 0.2 10*3/uL (ref 0.0–0.5)
Eosinophils Relative: 3 %
Immature Granulocytes: 0 %
Lymphocytes Relative: 46 %
Lymphs Abs: 3.1 10*3/uL (ref 0.7–4.0)
Monocytes Absolute: 0.5 10*3/uL (ref 0.1–1.0)
Monocytes Relative: 7 %
Neutro Abs: 2.9 10*3/uL (ref 1.7–7.7)
Neutrophils Relative %: 44 %

## 2022-05-31 LAB — CBC
HCT: 34.6 % — ABNORMAL LOW (ref 36.0–46.0)
Hemoglobin: 10.9 g/dL — ABNORMAL LOW (ref 12.0–15.0)
MCH: 29 pg (ref 26.0–34.0)
MCHC: 31.5 g/dL (ref 30.0–36.0)
MCV: 92 fL (ref 80.0–100.0)
Platelets: 266 10*3/uL (ref 150–400)
RBC: 3.76 MIL/uL — ABNORMAL LOW (ref 3.87–5.11)
RDW: 14.1 % (ref 11.5–15.5)
WBC: 6.6 10*3/uL (ref 4.0–10.5)
nRBC: 0 % (ref 0.0–0.2)

## 2022-05-31 LAB — URINALYSIS, ROUTINE W REFLEX MICROSCOPIC
Bacteria, UA: NONE SEEN
Bilirubin Urine: NEGATIVE
Glucose, UA: NEGATIVE mg/dL
Hgb urine dipstick: NEGATIVE
Ketones, ur: NEGATIVE mg/dL
Leukocytes,Ua: NEGATIVE
Nitrite: NEGATIVE
Protein, ur: >=300 mg/dL — AB
Specific Gravity, Urine: 1.023 (ref 1.005–1.030)
pH: 5 (ref 5.0–8.0)

## 2022-05-31 LAB — RAPID URINE DRUG SCREEN, HOSP PERFORMED
Amphetamines: NOT DETECTED
Barbiturates: NOT DETECTED
Benzodiazepines: NOT DETECTED
Cocaine: NOT DETECTED
Opiates: POSITIVE — AB
Tetrahydrocannabinol: NOT DETECTED

## 2022-05-31 LAB — ETHANOL: Alcohol, Ethyl (B): <10 mg/dL (ref <10)

## 2022-05-31 MED ORDER — LORAZEPAM 2 MG/ML IJ SOLN
1.0000 mg | Freq: Once | INTRAMUSCULAR | Status: AC
Start: 2022-05-31 — End: 2022-05-31
  Administered 2022-05-31: 1 mg via INTRAVENOUS
  Filled 2022-05-31: qty 1

## 2022-05-31 MED ORDER — TETRACAINE HCL 0.5 % OP SOLN
2.0000 [drp] | Freq: Once | OPHTHALMIC | Status: AC
  Administered 2022-05-31: 2 [drp] via OPHTHALMIC
  Filled 2022-05-31: qty 4

## 2022-05-31 MED ORDER — IOHEXOL 350 MG/ML SOLN
75.0000 mL | Freq: Once | INTRAVENOUS | Status: AC | PRN
  Administered 2022-05-31: 75 mL via INTRAVENOUS

## 2022-05-31 MED ORDER — PROCHLORPERAZINE EDISYLATE 10 MG/2ML IJ SOLN
5.0000 mg | Freq: Once | INTRAMUSCULAR | Status: AC
  Administered 2022-05-31: 5 mg via INTRAVENOUS
  Filled 2022-05-31: qty 2

## 2022-05-31 NOTE — ED Provider Notes (Signed)
[facility] EMERGENCY DEPARTMENT Provider Note   CSN: [id number] Arrival date & time: 05/31/22  1301     History {Add pertinent medical, surgical, social history, OB history to HPI:1} No chief complaint on file.   Erin Jackson is a 56 y.o. female.  HPI Patient presents with loss of vision in her left eye.  Reportedly Friday began to have some blurred vision but been Saturday had pre much total loss of vision in her left eye.  Does have a left-sided headache.  States she had headache with previous stroke.  Has had recent strokes and is on Plavix.  Appear to be embolic versus microvascular.  No known A-fib.  Has had a headache with a stroke before.  States she cannot see anything out of the eye.  States she is unsteady but is unsteady at baseline since previous stroke and walks with a walker.  Potentially more unsteady than her baseline    Home Medications Prior to Admission medications   Medication Sig Start Date End Date Taking? Authorizing Provider  acetaminophen (TYLENOL) 325 MG tablet Take 2 tablets (650 mg total) by mouth every 4 (four) hours as needed for fever or mild pain. 05/07/22   Setzer, Edman Circle, PA-C  allopurinol (ZYLOPRIM) 100 MG tablet Take 100 mg by mouth daily. 05/03/22   [provider]  alprazolam Duanne Moron) 2 MG tablet Take 0.5 tablets (1 mg total) by mouth 2 (two) times daily as needed for anxiety. 05/07/22   Setzer, Edman Circle, PA-C  atorvastatin (LIPITOR) 80 MG tablet Take 1 tablet (80 mg total) by mouth daily. 05/07/22   Setzer, Edman Circle, PA-C  cetirizine (ZYRTEC ALLERGY) 10 MG tablet Take 1 tablet (10 mg total) by mouth daily. 04/14/22   Valarie Merino, MD  cloNIDine (CATAPRES) 0.1 MG tablet Take 1 tablet (0.1 mg total) by mouth 2 (two) times daily. 05/07/22   Setzer, Edman Circle, PA-C  clopidogrel (PLAVIX) 75 MG tablet Take 1 tablet (75 mg total) by mouth daily. 05/07/22   Setzer, Edman Circle, PA-C  EPINEPHRINE 0.3 mg/0.3 mL IJ SOAJ injection Inject  0.3 mLs (0.3 mg total) into the muscle once as needed (allergic reaction). Patient taking differently: Inject 0.3 mg into the muscle as needed for anaphylaxis (allergic reaction). 10/04/18   Fabian November, MD  HYDROcodone-acetaminophen (NORCO) 10-325 MG tablet Take 1 tablet by mouth 3 (three) times daily as needed. 05/09/22   [provider]  HYDROcodone-acetaminophen (NORCO/VICODIN) 5-325 MG tablet Take 2 tablets by mouth 3 (three) times daily as needed for severe pain. 05/07/22   Setzer, Edman Circle, PA-C  hydrOXYzine (ATARAX) 25 MG tablet Take 25 mg by mouth 3 (three) times daily. 04/17/22   [provider]  Lancets (ONETOUCH DELICA PLUS [id number]) [place] Apply topically daily. 05/03/22   [provider]  meclizine (ANTIVERT) 12.5 MG tablet Take 1 tablet (12.5 mg total) by mouth 2 (two) times daily as needed for dizziness. 05/07/22   Setzer, Edman Circle, PA-C  metFORMIN (GLUCOPHAGE) 500 MG tablet Take 1 tablet (500 mg total) by mouth 2 (two) times daily with a meal. 05/08/22   Setzer, Edman Circle, PA-C  methocarbamol (ROBAXIN) 500 MG tablet Take 1 tablet (500 mg total) by mouth every 6 (six) hours as needed for muscle spasms. 05/19/22   Jennye Boroughs, MD  metoprolol tartrate (LOPRESSOR) 100 MG tablet Take 1 tablet (100 mg total) by mouth 2 (two) times daily. Patient not taking: Reported on 05/19/2022 05/07/22   Risa Grill  J, PA-C  NARCAN 4 MG/0.1ML LIQD nasal spray kit Place 0.4 mg into the nose as needed (opoid overdose). 01/08/20   [provider]  nicotine (NICODERM CQ - DOSED IN MG/24 HOURS) 14 mg/24hr patch Place 1 patch (14 mg total) onto the skin daily. Patient not taking: Reported on 05/19/2022 04/11/22   Elgergawy, Silver Huguenin, MD  nitroGLYCERIN (NITROSTAT) 0.4 MG SL tablet Place 1 tablet (0.4 mg total) under the tongue every 5 (five) minutes as needed for chest pain. Patient not taking: Reported on 05/19/2022 04/10/22   Elgergawy, Silver Huguenin, MD  omeprazole (PRILOSEC) 40 MG  capsule Take 40 mg by mouth 2 (two) times daily. 05/09/22   [provider]  [facility] VERIO test strip daily. 05/03/22   [provider]  prochlorperazine (COMPAZINE) 5 MG tablet Take 1-2 tablets (5-10 mg total) by mouth every 6 (six) hours as needed for nausea. 05/07/22   Setzer, Edman Circle, PA-C  traZODone (DESYREL) 150 MG tablet Take 1 tablet (150 mg total) by mouth at bedtime. 05/07/22   Setzer, Edman Circle, PA-C  Vitamin D, Ergocalciferol, (DRISDOL) 1.25 MG (50000 UNIT) CAPS capsule Take 1 capsule (50,000 Units total) by mouth every 7 (seven) days. 05/08/22   Setzer, Edman Circle, PA-C      Allergies    Cucumber extract, Molds & smuts, Norvasc [amlodipine], Other, Apple juice, Aspirin, Ibuprofen, and Niacin and related    Review of Systems   Review of Systems  Physical Exam Updated Vital Signs BP (!) 163/90   Pulse 78   Temp 98.9 F (37.2 C) (Oral)   Resp 17   SpO2 100%  Physical Exam Vitals and nursing note reviewed.  HENT:     Head: Atraumatic.  Eyes:     Extraocular Movements: Extraocular movements intact.     Comments: Loss of vision on left eye.  States cannot see light or dark.  No threat reflex although does have a pupillary response to light.  Both direct and consensual.  Eye movements intact.  Intraocular pressure of 14.  Pulmonary:     Breath sounds: No wheezing.  Abdominal:     Tenderness: There is no abdominal tenderness.  Musculoskeletal:        General: No tenderness.  Skin:    Capillary Refill: Capillary refill takes less than 2 seconds.  Neurological:     Mental Status: She is alert.     Comments: Awake and pleasant.  Unsteady at baseline.  Decreased vision left eye.     ED Results / Procedures / Treatments   Labs (all labs ordered are listed, but only abnormal results are displayed) Labs Reviewed  CBC - Abnormal; Notable for the following components:      Result Value   RBC 3.76 (*)    Hemoglobin 10.9 (*)    HCT 34.6 (*)    All other  components within normal limits  COMPREHENSIVE METABOLIC PANEL - Abnormal; Notable for the following components:   CO2 20 (*)    Glucose, Bld 140 (*)    Creatinine, Ser 1.24 (*)    Calcium 7.1 (*)    Albumin 2.9 (*)    GFR, Estimated 50 (*)    All other components within normal limits  ETHANOL  DIFFERENTIAL  RAPID URINE DRUG SCREEN, HOSP PERFORMED  URINALYSIS, ROUTINE W REFLEX MICROSCOPIC  PROTIME-INR  APTT    EKG EKG Interpretation  Date/Time:  Sunday May 31 2022 13:11:13 EDT Ventricular Rate:  91 PR Interval:  148 QRS Duration:  80 QT Interval:  392 QTC Calculation: 482 R Axis:   -11 Text Interpretation: Normal sinus rhythm Minimal voltage criteria for LVH, may be normal variant ( R in aVL ) Nonspecific T wave abnormality Prolonged QT Abnormal ECG When compared with ECG of 29-May-2022 11:59, No significant change since last tracing Confirmed by Davonna Belling [phone]) on 05/31/2022 4:24:28 PM  Radiology No results found.  Procedures Procedures  {Document cardiac monitor, telemetry assessment procedure when appropriate:1}  Medications Ordered in ED Medications  tetracaine (PONTOCAINE) 0.5 % ophthalmic solution 2 drop (2 drops Left Eye Given by Other 05/31/22 1628)    ED Course/ Medical Decision Making/ A&P                           Medical Decision Making Amount and/or Complexity of Data Reviewed Radiology: ordered.  Risk Prescription drug management.   ***  {Document critical care time when appropriate:1} {Document review of labs and clinical decision tools ie heart score, Chads2Vasc2 etc:1}  {Document your independent review of radiology images, and any outside records:1} {Document your discussion with family members, caretakers, and with consultants:1} {Document social determinants of health affecting pt's care:1} {Document your decision making why or why not admission, treatments were needed:1} Final Clinical Impression(s) / ED Diagnoses Final  diagnoses:  None    Rx / DC Orders ED Discharge Orders     None

## 2022-05-31 NOTE — ED Provider Triage Note (Signed)
Emergency Medicine Provider Triage Evaluation Note  Erin Jackson , a 57 y.o. female  was evaluated in triage.  Pt complains of left vision loss since yesterday, reports waking up yesterday morning and could not see out of her left eye.  She does have a prior history of CVA, however states that this is new.  Does report compliance with medication.  Ambulates with a walker at baseline.  Review of Systems  Positive: Vision loss Negative: Nausea, vomiting  Physical Exam  BP (!) 157/86 (BP Location: Left Arm)   Pulse 91   Temp 98.9 F (37.2 C) (Oral)   Resp 18   SpO2 97%  Gen:   Awake, no distress   Resp:  Normal effort  MSK:   Moves extremities without difficulty  Other:  Abnormal finger-nose, gait not evaluated.  Medical Decision Making  Medically screening exam initiated at 1:40 PM.  Appropriate orders placed.  YURI FLENER was informed that the remainder of the evaluation will be completed by another provider, this initial triage assessment does not replace that evaluation, and the importance of remaining in the ED until their evaluation is complete.     Claude Manges, PA-C 05/31/22 1340

## 2022-05-31 NOTE — ED Triage Notes (Signed)
Patient reports that she awoke yesterday morning and unable to see out of left eye. Patient reports pain and pressure to same, denies injury. Alert and oriented

## 2022-06-01 ENCOUNTER — Encounter (HOSPITAL_COMMUNITY): Payer: Self-pay | Admitting: Internal Medicine

## 2022-06-01 ENCOUNTER — Telehealth: Payer: Self-pay

## 2022-06-01 DIAGNOSIS — I130 Hypertensive heart and chronic kidney disease with heart failure and stage 1 through stage 4 chronic kidney disease, or unspecified chronic kidney disease: Secondary | ICD-10-CM | POA: Diagnosis not present

## 2022-06-01 DIAGNOSIS — E785 Hyperlipidemia, unspecified: Secondary | ICD-10-CM | POA: Diagnosis present

## 2022-06-01 DIAGNOSIS — H5462 Unqualified visual loss, left eye, normal vision right eye: Secondary | ICD-10-CM | POA: Diagnosis present

## 2022-06-01 DIAGNOSIS — M316 Other giant cell arteritis: Secondary | ICD-10-CM | POA: Diagnosis not present

## 2022-06-01 DIAGNOSIS — E1169 Type 2 diabetes mellitus with other specified complication: Secondary | ICD-10-CM

## 2022-06-01 DIAGNOSIS — E1165 Type 2 diabetes mellitus with hyperglycemia: Secondary | ICD-10-CM | POA: Diagnosis present

## 2022-06-01 DIAGNOSIS — K219 Gastro-esophageal reflux disease without esophagitis: Secondary | ICD-10-CM | POA: Diagnosis present

## 2022-06-01 DIAGNOSIS — Z7902 Long term (current) use of antithrombotics/antiplatelets: Secondary | ICD-10-CM | POA: Diagnosis not present

## 2022-06-01 DIAGNOSIS — I2510 Atherosclerotic heart disease of native coronary artery without angina pectoris: Secondary | ICD-10-CM | POA: Diagnosis present

## 2022-06-01 DIAGNOSIS — R531 Weakness: Secondary | ICD-10-CM | POA: Diagnosis not present

## 2022-06-01 DIAGNOSIS — N189 Chronic kidney disease, unspecified: Secondary | ICD-10-CM | POA: Diagnosis not present

## 2022-06-01 DIAGNOSIS — E1151 Type 2 diabetes mellitus with diabetic peripheral angiopathy without gangrene: Secondary | ICD-10-CM | POA: Diagnosis present

## 2022-06-01 DIAGNOSIS — E1122 Type 2 diabetes mellitus with diabetic chronic kidney disease: Secondary | ICD-10-CM | POA: Diagnosis not present

## 2022-06-01 DIAGNOSIS — Z888 Allergy status to other drugs, medicaments and biological substances status: Secondary | ICD-10-CM | POA: Diagnosis not present

## 2022-06-01 DIAGNOSIS — I509 Heart failure, unspecified: Secondary | ICD-10-CM | POA: Diagnosis not present

## 2022-06-01 DIAGNOSIS — I5022 Chronic systolic (congestive) heart failure: Secondary | ICD-10-CM | POA: Diagnosis not present

## 2022-06-01 DIAGNOSIS — H5460 Unqualified visual loss, one eye, unspecified: Secondary | ICD-10-CM

## 2022-06-01 DIAGNOSIS — H547 Unspecified visual loss: Secondary | ICD-10-CM

## 2022-06-01 DIAGNOSIS — Z8673 Personal history of transient ischemic attack (TIA), and cerebral infarction without residual deficits: Secondary | ICD-10-CM | POA: Diagnosis not present

## 2022-06-01 DIAGNOSIS — E669 Obesity, unspecified: Secondary | ICD-10-CM | POA: Diagnosis present

## 2022-06-01 DIAGNOSIS — S0590XA Unspecified injury of unspecified eye and orbit, initial encounter: Secondary | ICD-10-CM | POA: Diagnosis not present

## 2022-06-01 DIAGNOSIS — I25119 Atherosclerotic heart disease of native coronary artery with unspecified angina pectoris: Secondary | ICD-10-CM | POA: Diagnosis not present

## 2022-06-01 DIAGNOSIS — I160 Hypertensive urgency: Secondary | ICD-10-CM | POA: Diagnosis present

## 2022-06-01 DIAGNOSIS — Z87891 Personal history of nicotine dependence: Secondary | ICD-10-CM | POA: Diagnosis not present

## 2022-06-01 DIAGNOSIS — H538 Other visual disturbances: Secondary | ICD-10-CM | POA: Diagnosis not present

## 2022-06-01 DIAGNOSIS — F419 Anxiety disorder, unspecified: Secondary | ICD-10-CM | POA: Diagnosis present

## 2022-06-01 DIAGNOSIS — R2689 Other abnormalities of gait and mobility: Secondary | ICD-10-CM | POA: Diagnosis not present

## 2022-06-01 DIAGNOSIS — G8929 Other chronic pain: Secondary | ICD-10-CM | POA: Diagnosis present

## 2022-06-01 DIAGNOSIS — M2550 Pain in unspecified joint: Secondary | ICD-10-CM | POA: Diagnosis not present

## 2022-06-01 DIAGNOSIS — M6281 Muscle weakness (generalized): Secondary | ICD-10-CM | POA: Diagnosis not present

## 2022-06-01 DIAGNOSIS — Z7984 Long term (current) use of oral hypoglycemic drugs: Secondary | ICD-10-CM | POA: Diagnosis not present

## 2022-06-01 DIAGNOSIS — Z886 Allergy status to analgesic agent status: Secondary | ICD-10-CM | POA: Diagnosis not present

## 2022-06-01 DIAGNOSIS — R262 Difficulty in walking, not elsewhere classified: Secondary | ICD-10-CM | POA: Diagnosis not present

## 2022-06-01 DIAGNOSIS — M549 Dorsalgia, unspecified: Secondary | ICD-10-CM | POA: Diagnosis present

## 2022-06-01 DIAGNOSIS — T380X5A Adverse effect of glucocorticoids and synthetic analogues, initial encounter: Secondary | ICD-10-CM | POA: Diagnosis present

## 2022-06-01 DIAGNOSIS — Z7401 Bed confinement status: Secondary | ICD-10-CM | POA: Diagnosis not present

## 2022-06-01 DIAGNOSIS — Z79899 Other long term (current) drug therapy: Secondary | ICD-10-CM | POA: Diagnosis not present

## 2022-06-01 DIAGNOSIS — N1831 Chronic kidney disease, stage 3a: Secondary | ICD-10-CM | POA: Diagnosis present

## 2022-06-01 DIAGNOSIS — N1832 Chronic kidney disease, stage 3b: Secondary | ICD-10-CM | POA: Diagnosis not present

## 2022-06-01 DIAGNOSIS — F4321 Adjustment disorder with depressed mood: Secondary | ICD-10-CM | POA: Diagnosis not present

## 2022-06-01 DIAGNOSIS — D631 Anemia in chronic kidney disease: Secondary | ICD-10-CM | POA: Diagnosis not present

## 2022-06-01 DIAGNOSIS — F17210 Nicotine dependence, cigarettes, uncomplicated: Secondary | ICD-10-CM | POA: Diagnosis present

## 2022-06-01 DIAGNOSIS — Z91030 Bee allergy status: Secondary | ICD-10-CM | POA: Diagnosis not present

## 2022-06-01 LAB — APTT: aPTT: 27 seconds (ref 24–36)

## 2022-06-01 LAB — GLUCOSE, CAPILLARY: Glucose-Capillary: 255 mg/dL — ABNORMAL HIGH (ref 70–99)

## 2022-06-01 LAB — PROTIME-INR
INR: 1.2 (ref 0.8–1.2)
Prothrombin Time: 15 seconds (ref 11.4–15.2)

## 2022-06-01 LAB — CBG MONITORING, ED
Glucose-Capillary: 206 mg/dL — ABNORMAL HIGH (ref 70–99)
Glucose-Capillary: 208 mg/dL — ABNORMAL HIGH (ref 70–99)
Glucose-Capillary: 249 mg/dL — ABNORMAL HIGH (ref 70–99)

## 2022-06-01 LAB — C-REACTIVE PROTEIN: CRP: <0.5 mg/dL (ref <1.0)

## 2022-06-01 LAB — SEDIMENTATION RATE: Sed Rate: 50 mm/hr — ABNORMAL HIGH (ref 0–22)

## 2022-06-01 MED ORDER — SODIUM CHLORIDE 0.9 % IV SOLN
1.5000 g | INTRAVENOUS | Status: AC
  Administered 2022-06-02: 1.5 g via INTRAVENOUS
  Filled 2022-06-01: qty 1.5

## 2022-06-01 MED ORDER — INSULIN ASPART 100 UNIT/ML IJ SOLN
0.0000 [IU] | Freq: Three times a day (TID) | INTRAMUSCULAR | Status: DC
  Administered 2022-06-01 (×3): 3 [IU] via SUBCUTANEOUS
  Administered 2022-06-02: 7 [IU] via SUBCUTANEOUS
  Administered 2022-06-03: 1 [IU] via SUBCUTANEOUS
  Administered 2022-06-03: 3 [IU] via SUBCUTANEOUS
  Administered 2022-06-04: 2 [IU] via SUBCUTANEOUS

## 2022-06-01 MED ORDER — CLONIDINE HCL 0.1 MG PO TABS
0.1000 mg | ORAL_TABLET | Freq: Two times a day (BID) | ORAL | Status: DC
  Administered 2022-06-01 (×2): 0.1 mg via ORAL
  Filled 2022-06-01 (×2): qty 1

## 2022-06-01 MED ORDER — PREDNISONE 20 MG PO TABS: 60.0000 mg | ORAL_TABLET | Freq: Every day | ORAL | Status: DC

## 2022-06-01 MED ORDER — HYDROCODONE-ACETAMINOPHEN 10-325 MG PO TABS
1.0000 | ORAL_TABLET | Freq: Three times a day (TID) | ORAL | Status: DC | PRN
  Administered 2022-06-01 – 2022-06-07 (×13): 1 via ORAL
  Filled 2022-06-01 (×14): qty 1

## 2022-06-01 MED ORDER — ACETAMINOPHEN 325 MG PO TABS
650.0000 mg | ORAL_TABLET | Freq: Four times a day (QID) | ORAL | Status: DC | PRN
  Administered 2022-06-02 – 2022-06-06 (×5): 650 mg via ORAL
  Filled 2022-06-01 (×5): qty 2

## 2022-06-01 MED ORDER — CLOPIDOGREL BISULFATE 75 MG PO TABS: 75.0000 mg | ORAL_TABLET | Freq: Every day | ORAL | Status: DC

## 2022-06-01 MED ORDER — ALPRAZOLAM 0.5 MG PO TABS
1.0000 mg | ORAL_TABLET | Freq: Two times a day (BID) | ORAL | Status: DC | PRN
  Administered 2022-06-01 – 2022-06-03 (×4): 1 mg via ORAL
  Filled 2022-06-01: qty 2
  Filled 2022-06-01: qty 4
  Filled 2022-06-01 (×2): qty 2

## 2022-06-01 MED ORDER — ACETAMINOPHEN 650 MG RE SUPP: 650.0000 mg | Freq: Four times a day (QID) | RECTAL | Status: DC | PRN

## 2022-06-01 MED ORDER — HYDRALAZINE HCL 20 MG/ML IJ SOLN
10.0000 mg | Freq: Four times a day (QID) | INTRAMUSCULAR | Status: DC | PRN
  Administered 2022-06-01 – 2022-06-05 (×4): 10 mg via INTRAVENOUS
  Filled 2022-06-01 (×4): qty 1

## 2022-06-01 MED ORDER — ALLOPURINOL 100 MG PO TABS
100.0000 mg | ORAL_TABLET | Freq: Every day | ORAL | Status: DC
  Administered 2022-06-01 – 2022-06-08 (×7): 100 mg via ORAL
  Filled 2022-06-01 (×7): qty 1

## 2022-06-01 MED ORDER — ATORVASTATIN CALCIUM 80 MG PO TABS
80.0000 mg | ORAL_TABLET | Freq: Every day | ORAL | Status: DC
  Administered 2022-06-01 – 2022-06-08 (×7): 80 mg via ORAL
  Filled 2022-06-01 (×5): qty 1
  Filled 2022-06-01: qty 2
  Filled 2022-06-01: qty 1

## 2022-06-01 MED ORDER — TRAZODONE HCL 50 MG PO TABS
150.0000 mg | ORAL_TABLET | Freq: Every day | ORAL | Status: DC
  Administered 2022-06-01 – 2022-06-07 (×7): 150 mg via ORAL
  Filled 2022-06-01 (×7): qty 1

## 2022-06-01 MED ORDER — PREDNISONE 20 MG PO TABS
60.0000 mg | ORAL_TABLET | Freq: Every day | ORAL | Status: DC
  Administered 2022-06-01 – 2022-06-08 (×7): 60 mg via ORAL
  Filled 2022-06-01 (×7): qty 3

## 2022-06-01 MED ORDER — HYDROXYZINE HCL 25 MG PO TABS
25.0000 mg | ORAL_TABLET | Freq: Three times a day (TID) | ORAL | Status: DC
  Administered 2022-06-01 – 2022-06-08 (×21): 25 mg via ORAL
  Filled 2022-06-01 (×21): qty 1

## 2022-06-01 NOTE — Progress Notes (Signed)
Neurology Progress Note  Brief HPI: Erin Jackson is a 56 y.o. African American female with PMH of hypertension, diabetes, CAD, cardiomyopathy, PVD, hyperlipidemia, smoker, obesity, stroke in 04/2022 presented to ED for left vision loss starting Saturday. Symptoms initially started Monday with dizziness and lightheadedness and then Friday she developed a left temporal headache, scalp tenderness, and pain extending to the left orbital area.    Subjective: Sitting comfortably in ED. She does still have a headache and vision loss in the left eye.   Exam: Vitals:   06/01/22 0900 06/01/22 1020  BP: (!) 196/145 (!) 181/101  Pulse: (!) 116 (!) 106  Resp: (!) 1 20  Temp:    SpO2: 98% 100%   Gen: In bed, NAD Resp: non-labored breathing, no acute distress Abd: soft, nt  Neuro: Mental Status: Aox4, naming, comprehension, repetition intact.  Cranial Nerves: total blindness of OS, left pupil sluggish, pain with palpation of left temporal area.  Motor: tone and bulk normal, 5/5 strength in all extremities Sensory: light touch sensation intact Gait: Deferred  Pertinent Labs: CRP < 0.5 ESR 50  Imaging Reviewed: MRI brain and orbital no acute finding.   CT head and neck no LVO, soft plaque narrowing distal left ICA but less than 50% stenosis.  Assessment: 56 y.o. female with PMH of hypertension, diabetes, CAD, cardiomyopathy, PVD, hyperlipidemia, smoker, obesity, stroke in 04/2022 presented to ED for left vision loss, also has left temporal headache, scalp tenderness on touch, pain also extending to left orbital area as well as left vision loss. Denies fever, shoulder pain or jaw claudication.     Impression: Etiology for patient's symptoms concerning for temporal arteritis on the left.  Recommendations: - VVS consult for temporal artery biopsy, plan for tomorrow -Empiric treatment of prednisone -Pending ESR and CRP -Continue other home meds  Patient seen and examined by NP/APP with MD. MD  to update note as needed.   [name], DNP, FNP-BC [facility] Pager: [phone]

## 2022-06-01 NOTE — Telephone Encounter (Signed)
Patient is aware Dr. Carlis Abbott is not in the office today or tomorrow:  Patient is currently in the ED for vision loss in the left eye.  She stated, ' I would like to speak to Dr. Carlis Abbott. Because they are wanting to do brain surgery tomorrow.'   Patient has been informed Dr. Carlis Abbott is not here today or tomorrow but the message will be sent.    Call back phone #[phone].

## 2022-06-01 NOTE — Telephone Encounter (Signed)
Erin Jackson,PT from [place] called requesting HHPT for 1wk5. Orders given and approved.

## 2022-06-01 NOTE — Progress Notes (Addendum)
PROGRESS NOTE    Erin Jackson  ZCH:[id number] DOB: [birth year] DOA: 05/31/2022 PCP: Merryl Hacker, No   Brief Narrative:  HPI: Erin Jackson is a 56 y.o. female with history of recent admission for stroke last month, diabetes mellitus, hypertension, chronic kidney disease stage II, chronic pain, peripheral vascular disease presents to the ER after patient had persistent loss of vision since waking up in the morning on May 30, 2022.  Denies any weakness of the extremities.  Complains of left temporal headache same time.   ED Course: In the ER patient had MRI brain and MRI orbits CT angiogram of head and neck.  Neurology evaluated the patient at this time patient's symptoms are concerning for temporal arteritis started on prednisone admitted for further work-up including possible need for temporal artery biopsy.  Sed rate is 50  Assessment & Plan:   Principal Problem:   Vision loss Active Problems:   T2DM (type 2 diabetes mellitus) (HCC)   CAD (coronary artery disease)   Stage 3b chronic kidney disease (HCC)   Chronic systolic heart failure (HCC)  Sudden loss of vision left eye/possible temporal arteritis: Patient presents with sudden loss of vision, left-sided headache and has tenderness on the left temporal area with ESR over 50 and she is 56 as well.  She meets criteria for temporal arteritis.  She has been seen by neurology.  Has been started on prednisone in an effort to reverse her vision loss.  I have consulted vascular surgery for temporal artery biopsy.  She is scheduled for that tomorrow.  Recent ischemic stroke: No focal deficit.  Continue statin and holding Plavix for biopsy tomorrow.  Hypertensive urgency: Patient's systolic blood pressure has reached as high as 287 and diastolic as high as 867.  She takes only clonidine at home which has been resumed and that is only twice a day.  I have added hydralazine as needed.  We will also add labetalol as needed.  Monitor closely.  Diabetes  mellitus type 2: Last hemoglobin A1c was 6.8 just last month.  She takes metformin at home.  Holding metformin.  Continue SSI.  Anticipating hyperglycemia due to being on prednisone.  CKD stage IIIa: At baseline.  Monitor.  DVT prophylaxis: SCDs Start: 06/01/22 0515   Code Status: Full Code  Family Communication:  None present at bedside.  Plan of care discussed with patient in length and he/she verbalized understanding and agreed with it.  I also called and updated her son over the phone.  Status is: Observation The patient will require care spanning > 2 midnights and should be moved to inpatient because: scheduled for temporal artery biopsy tomorrow.    Estimated body mass index is 38.74 kg/m as calculated from the following:   Height as of 05/19/22: '5\' 2"'  (1.575 m).   Weight as of 05/19/22: 96.1 kg.    Nutritional Assessment: There is no height or weight on file to calculate BMI.. Seen by dietician.  I agree with the assessment and plan as outlined below: Nutrition Status:   Skin Assessment: I have examined the patient's skin and I agree with the wound assessment as performed by the wound care RN as outlined below:    Consultants:  Neurology Vascular surgery Procedures:  None  Antimicrobials:  Anti-infectives (From admission, onward)    Start     Dose/Rate Route Frequency Ordered Stop   06/02/22 0600  cefUROXime (ZINACEF) 1.5 g in sodium chloride 0.9 % 100 mL IVPB  1.5 g 200 mL/hr over 30 Minutes Intravenous On call to O.R. 06/01/22 [id number] 06/03/22 0559         Subjective: Seen and examined in the ED.  Still has no recovery in the region in the left side.  Has some left temporal headache.  No other complaint.  Objective: Vitals:   06/01/22 0143 06/01/22 0837 06/01/22 0840 06/01/22 0900  BP: (!) 174/68 (!) 184/133 (!) 193/114 (!) 196/145  Pulse: 80 (!) 110 (!) 106 (!) 116  Resp: '16 19 19 ' (!) 1  Temp:      TempSrc:      SpO2: 100%  98% 98%   No intake or  output data in the 24 hours ending 06/01/22 0951 There were no vitals filed for this visit.  Examination:  General exam: Appears calm and comfortable  Respiratory system: Clear to auscultation. Respiratory effort normal. Cardiovascular system: S1 & S2 heard, RRR. No JVD, murmurs, rubs, gallops or clicks. No pedal edema. Gastrointestinal system: Abdomen is nondistended, soft and nontender. No organomegaly or masses felt. Normal bowel sounds heard. Central nervous system: Alert and oriented.  Total vision loss in the left side.  Left temporal tenderness. Extremities: Symmetric 5 x 5 power. Skin: No rashes, lesions or ulcers Psychiatry: Judgement and insight appear normal. Mood & affect appropriate.    Data Reviewed: I have personally reviewed following labs and imaging studies  CBC: Recent Labs  Lab 05/29/22 1158 05/31/22 1410  WBC 5.8 6.6  NEUTROABS 2.3 2.9  HGB 11.5* 10.9*  HCT 36.8 34.6*  MCV 91.5 92.0  PLT 280 433   Basic Metabolic Panel: Recent Labs  Lab 05/29/22 1158 05/31/22 1410  NA 139 138  K 3.8 3.9  CL 105 107  CO2 23 20*  GLUCOSE 184* 140*  BUN 16 20  CREATININE 1.24* 1.24*  CALCIUM 7.2* 7.1*   GFR: Estimated Creatinine Clearance: 53.5 mL/min (A) (by C-G formula based on SCr of 1.24 mg/dL (H)). Liver Function Tests: Recent Labs  Lab 05/29/22 1158 05/31/22 1410  AST 17 21  ALT 12 10  ALKPHOS 59 72  BILITOT 0.5 0.4  PROT 6.7 6.5  ALBUMIN 3.0* 2.9*   No results for input(s): "LIPASE", "AMYLASE" in the last 168 hours. No results for input(s): "AMMONIA" in the last 168 hours. Coagulation Profile: Recent Labs  Lab 06/01/22 0120  INR 1.2   Cardiac Enzymes: No results for input(s): "CKTOTAL", "CKMB", "CKMBINDEX", "TROPONINI" in the last 168 hours. BNP (last 3 results) No results for input(s): "PROBNP" in the last 8760 hours. HbA1C: No results for input(s): "HGBA1C" in the last 72 hours. CBG: Recent Labs  Lab 06/01/22 0745  GLUCAP 206*    Lipid Profile: No results for input(s): "CHOL", "HDL", "LDLCALC", "TRIG", "CHOLHDL", "LDLDIRECT" in the last 72 hours. Thyroid Function Tests: No results for input(s): "TSH", "T4TOTAL", "FREET4", "T3FREE", "THYROIDAB" in the last 72 hours. Anemia Panel: No results for input(s): "VITAMINB12", "FOLATE", "FERRITIN", "TIBC", "IRON", "RETICCTPCT" in the last 72 hours. Sepsis Labs: No results for input(s): "PROCALCITON", "LATICACIDVEN" in the last 168 hours.  No results found for this or any previous visit (from the past 240 hour(s)).   Radiology Studies: CT ANGIO HEAD NECK W WO CM  Result Date: 05/31/2022 CLINICAL DATA:  Left eye vision loss for 1 week EXAM: CT ANGIOGRAPHY HEAD AND NECK TECHNIQUE: Multidetector CT imaging of the head and neck was performed using the standard protocol during bolus administration of intravenous contrast. Multiplanar CT image reconstructions and MIPs were obtained  to evaluate the vascular anatomy. Carotid stenosis measurements (when applicable) are obtained utilizing NASCET criteria, using the distal internal carotid diameter as the denominator. RADIATION DOSE REDUCTION: This exam was performed according to the departmental dose-optimization program which includes automated exposure control, adjustment of the mA and/or kV according to patient size and/or use of iterative reconstruction technique. CONTRAST:  34m OMNIPAQUE IOHEXOL 350 MG/ML SOLN COMPARISON:  None Available. FINDINGS: CT HEAD FINDINGS Brain: There is no mass, hemorrhage or extra-axial collection. The size and configuration of the ventricles and extra-axial CSF spaces are normal. There is no acute or chronic infarction. The brain parenchyma is normal. Skull: The visualized skull base, calvarium and extracranial soft tissues are normal. Sinuses/Orbits: No fluid levels or advanced mucosal thickening of the visualized paranasal sinuses. No mastoid or middle ear effusion. The orbits are normal. CTA NECK FINDINGS  SKELETON: There is no bony spinal canal stenosis. No lytic or blastic lesion. OTHER NECK: Normal pharynx, larynx and major salivary glands. No cervical lymphadenopathy. Unremarkable thyroid gland. UPPER CHEST: No pneumothorax or pleural effusion. No nodules or masses. AORTIC ARCH: There is calcific atherosclerosis of the aortic arch. There is no aneurysm, dissection or hemodynamically significant stenosis of the visualized portion of the aorta. Conventional 3 vessel aortic branching pattern. The visualized proximal subclavian arteries are widely patent. RIGHT CAROTID SYSTEM: Normal without aneurysm, dissection or stenosis. LEFT CAROTID SYSTEM: No dissection, occlusion or aneurysm. Mild atherosclerotic calcification at the carotid bifurcation without hemodynamically significant stenosis. There is soft plaque narrowing the distal left ICA causing less than 50% stenosis. VERTEBRAL ARTERIES: Left dominant configuration. Both origins are clearly patent. There is no dissection, occlusion or flow-limiting stenosis to the skull base (V1-V3 segments). CTA HEAD FINDINGS POSTERIOR CIRCULATION: --Vertebral arteries: Normal V4 segments. --Inferior cerebellar arteries: Normal. --Basilar artery: Normal. --Superior cerebellar arteries: Normal. --Posterior cerebral arteries (PCA): Normal. ANTERIOR CIRCULATION: --Intracranial internal carotid arteries: Normal. --Anterior cerebral arteries (ACA): Normal. Both A1 segments are present. Patent anterior communicating artery (a-comm). --Middle cerebral arteries (MCA): Normal. VENOUS SINUSES: As permitted by contrast timing, patent. ANATOMIC VARIANTS: None Review of the MIP images confirms the above findings. IMPRESSION: 1. No emergent large vessel occlusion or hemodynamically significant stenosis of the head or neck. 2. Soft plaque narrowing the distal left ICA causing less than 50% stenosis. 3. Aortic Atherosclerosis (ICD10-I70.0). Electronically Signed   By: KUlyses JarredM.D.   On:  05/31/2022 23:34   MR BRAIN WO CONTRAST  Result Date: 05/31/2022 CLINICAL DATA:  Monocular vision loss EXAM: MRI HEAD AND ORBITS WITHOUT CONTRAST TECHNIQUE: Multiplanar, multi-echo pulse sequences of the brain and surrounding structures were acquired without intravenous contrast. Multiplanar, multi-echo pulse sequences of the orbits and surrounding structures were acquired including fat saturation techniques, without intravenous contrast administration. COMPARISON:  None Available. FINDINGS: MRI HEAD FINDINGS Brain: No acute infarct, mass effect or extra-axial collection. No acute or chronic hemorrhage. There is multifocal hyperintense T2-weighted signal within the white matter. Parenchymal volume and CSF spaces are normal. There are old right pontine and left basal ganglia small vessel infarct. The midline structures are normal. Vascular: Major flow voids are preserved. Skull and upper cervical spine: Normal calvarium and skull base. Visualized upper cervical spine and soft tissues are normal. Sinuses/Orbits:No paranasal sinus fluid levels or advanced mucosal thickening. No mastoid or middle ear effusion. Normal orbits. MRI ORBITS FINDINGS Orbits: The globes are intact. Optic nerves are normal. Normal extraocular muscles and lacrimal glands. The bony orbit, preseptal soft tissues and the intra- and extraconal orbital  fat are normal. Visualized sinuses:  No fluid levels or advanced mucosal thickening. Soft tissues: Normal. IMPRESSION: 1. Mild chronic small vessel disease with old right pontine and left basal ganglia small vessel infarcts. 2. Normal orbits. Electronically Signed   By: Ulyses Jarred M.D.   On: 05/31/2022 23:03   MR ORBITS WO CONTRAST  Result Date: 05/31/2022 CLINICAL DATA:  Monocular vision loss EXAM: MRI HEAD AND ORBITS WITHOUT CONTRAST TECHNIQUE: Multiplanar, multi-echo pulse sequences of the brain and surrounding structures were acquired without intravenous contrast. Multiplanar, multi-echo  pulse sequences of the orbits and surrounding structures were acquired including fat saturation techniques, without intravenous contrast administration. COMPARISON:  None Available. FINDINGS: MRI HEAD FINDINGS Brain: No acute infarct, mass effect or extra-axial collection. No acute or chronic hemorrhage. There is multifocal hyperintense T2-weighted signal within the white matter. Parenchymal volume and CSF spaces are normal. There are old right pontine and left basal ganglia small vessel infarct. The midline structures are normal. Vascular: Major flow voids are preserved. Skull and upper cervical spine: Normal calvarium and skull base. Visualized upper cervical spine and soft tissues are normal. Sinuses/Orbits:No paranasal sinus fluid levels or advanced mucosal thickening. No mastoid or middle ear effusion. Normal orbits. MRI ORBITS FINDINGS Orbits: The globes are intact. Optic nerves are normal. Normal extraocular muscles and lacrimal glands. The bony orbit, preseptal soft tissues and the intra- and extraconal orbital fat are normal. Visualized sinuses:  No fluid levels or advanced mucosal thickening. Soft tissues: Normal. IMPRESSION: 1. Mild chronic small vessel disease with old right pontine and left basal ganglia small vessel infarcts. 2. Normal orbits. Electronically Signed   By: Ulyses Jarred M.D.   On: 05/31/2022 23:03    Scheduled Meds:  allopurinol  100 mg Oral Daily   atorvastatin  80 mg Oral Daily   cloNIDine  0.1 mg Oral BID   hydrOXYzine  25 mg Oral TID   insulin aspart  0-9 Units Subcutaneous TID WC   lidocaine  1 patch Transdermal Q24H   predniSONE  60 mg Oral Q breakfast   traZODone  150 mg Oral QHS   Continuous Infusions:  [START ON 06/02/2022] cefUROXime (ZINACEF)  IV       LOS: 0 days   Darliss Cheney, MD [facility]  06/01/2022, 9:51 AM   *Please note that this is a verbal dictation therefore any spelling or grammatical errors are due to the "[place] [employer]" system  interpretation.  Please page via [place] and do not message via secure chat for urgent patient care matters. Secure chat can be used for non urgent patient care matters.  How to contact the [facility] Attending or Consulting provider [place] or covering provider during after hours [place], for this patient?  Check the care team in [facility] and look for a) attending/consulting [facility] provider listed and b) the [facility] team listed. Page or secure chat [phone]. Log into [email or web address] and use [place]'s universal password to access. If you do not have the password, please contact the hospital operator. Locate the [facility] provider you are looking for under [facility] and page to a number that you can be directly reached. If you still have difficulty reaching the provider, please page the [facility] (Director on Call) for the Hospitalists listed on amion for assistance.

## 2022-06-01 NOTE — Consult Note (Signed)
ED Consult    Reason for Consult: Concern for temporal arteritis Referring Physician:  Dr. Doristine Bosworth MRN #:  [id number]  History of Present Illness: This is a 56 y.o. female has previously been evaluated for short distance claudication on the left found to have infrarenal disease and plan was for follow-up in our office.  States that she is still having numbness of her left foot.  More recently she is having dizziness and left-sided facial headache extending into her eye area with loss of vision in the left eye since Friday.  She was evaluated a few days ago for this but ultimately left without any treatment.  She now returns for further evaluation.  She takes Plavix and a statin.  Past Medical History:  Diagnosis Date   Anxiety    Chronic back pain    Diabetes mellitus without complication ([place])    GERD (gastroesophageal reflux disease)    Hypertension     Past Surgical History:  Procedure Laterality Date   ABDOMINAL HYSTERECTOMY     BREAST LUMPECTOMY Left 03/25/2022   Procedure: LEFT BREAST LUMPECTOMY;  Surgeon: Jovita Kussmaul, MD;  Location: [facility];  Service: General;  Laterality: Left;   BREAST REDUCTION SURGERY Bilateral 08/15/2018   Procedure: BILATERAL MAMMARY REDUCTION  (BREAST);  Surgeon: Cristine Polio, MD;  Location: [facility];  Service: Plastics;  Laterality: Bilateral;   BREAST REDUCTION SURGERY Left 09/25/2019   Procedure: EXCISION OF FAT NECROSIS OF LEFT BREAST;  Surgeon: Cristine Polio, MD;  Location: [facility];  Service: Plastics;  Laterality: Left;   CARPAL TUNNEL RELEASE Right 06/17/2020   right thumb CMC arthroplasty with tendon transfer and right carpal tunnel release.    CERVICAL SPINE SURGERY  2015   has a metal plate   FOOT SURGERY Left 2014   HAND SURGERY     LEFT HEART CATH AND CORONARY ANGIOGRAPHY N/A 04/09/2022   Procedure: LEFT HEART CATH AND CORONARY ANGIOGRAPHY;  Surgeon: Dixie Dials, MD;  Location:  [facility];  Service: Cardiovascular;  Laterality: N/A;   REDUCTION MAMMAPLASTY Bilateral 07/2018    Allergies  Allergen Reactions   Cucumber Extract Anaphylaxis   Molds & Smuts Shortness Of Breath   Norvasc [Amlodipine] Other (See Comments)    Muscle became very tight   Other Anaphylaxis and Other (See Comments)    Tree nuts, walnuts, peanuts, pickles   Apple Juice Hives   Aspirin Rash   Ibuprofen Rash   Niacin And Related Rash    Prior to Admission medications   Medication Sig Start Date End Date Taking? Authorizing Provider  acetaminophen (TYLENOL) 325 MG tablet Take 2 tablets (650 mg total) by mouth every 4 (four) hours as needed for fever or mild pain. 05/07/22  Yes Setzer, Edman Circle, PA-C  allopurinol (ZYLOPRIM) 100 MG tablet Take 100 mg by mouth daily. 05/03/22  Yes [provider]  alprazolam Duanne Moron) 2 MG tablet Take 0.5 tablets (1 mg total) by mouth 2 (two) times daily as needed for anxiety. 05/07/22  Yes Setzer, Edman Circle, PA-C  atorvastatin (LIPITOR) 80 MG tablet Take 1 tablet (80 mg total) by mouth daily. 05/07/22  Yes Setzer, Edman Circle, PA-C  cetirizine (ZYRTEC ALLERGY) 10 MG tablet Take 1 tablet (10 mg total) by mouth daily. Patient taking differently: Take 10 mg by mouth daily as needed for allergies. 04/14/22  Yes Valarie Merino, MD  cloNIDine (CATAPRES) 0.1 MG tablet Take 1 tablet (0.1 mg total) by mouth  2 (two) times daily. 05/07/22  Yes Setzer, Edman Circle, PA-C  clopidogrel (PLAVIX) 75 MG tablet Take 1 tablet (75 mg total) by mouth daily. 05/07/22  Yes Setzer, Edman Circle, PA-C  EPINEPHRINE 0.3 mg/0.3 mL IJ SOAJ injection Inject 0.3 mLs (0.3 mg total) into the muscle once as needed (allergic reaction). Patient taking differently: Inject 0.3 mg into the muscle as needed for anaphylaxis (allergic reaction). 10/04/18  Yes Fabian November, MD  HYDROcodone-acetaminophen (NORCO) 10-325 MG tablet Take 1 tablet by mouth 3 (three) times daily as needed for moderate pain.  05/09/22  Yes [provider]  hydrOXYzine (ATARAX) 25 MG tablet Take 25 mg by mouth 3 (three) times daily. 04/17/22  Yes [provider]  meclizine (ANTIVERT) 12.5 MG tablet Take 1 tablet (12.5 mg total) by mouth 2 (two) times daily as needed for dizziness. 05/07/22  Yes Setzer, Edman Circle, PA-C  metFORMIN (GLUCOPHAGE) 500 MG tablet Take 1 tablet (500 mg total) by mouth 2 (two) times daily with a meal. 05/08/22  Yes Setzer, Edman Circle, PA-C  methocarbamol (ROBAXIN) 500 MG tablet Take 1 tablet (500 mg total) by mouth every 6 (six) hours as needed for muscle spasms. 05/19/22  Yes Jennye Boroughs, MD  NARCAN 4 MG/0.1ML LIQD nasal spray kit Place 0.4 mg into the nose as needed (opoid overdose). 01/08/20  Yes [provider]  nitroGLYCERIN (NITROSTAT) 0.4 MG SL tablet Place 1 tablet (0.4 mg total) under the tongue every 5 (five) minutes as needed for chest pain. 04/10/22  Yes Elgergawy, Silver Huguenin, MD  omeprazole (PRILOSEC) 40 MG capsule Take 40 mg by mouth 2 (two) times daily. 05/09/22  Yes [provider]  prochlorperazine (COMPAZINE) 5 MG tablet Take 1-2 tablets (5-10 mg total) by mouth every 6 (six) hours as needed for nausea. 05/07/22  Yes Setzer, Edman Circle, PA-C  Vitamin D, Ergocalciferol, (DRISDOL) 1.25 MG (50000 UNIT) CAPS capsule Take 1 capsule (50,000 Units total) by mouth every 7 (seven) days. Patient taking differently: Take 50,000 Units by mouth every Monday. 05/08/22  Yes Setzer, Edman Circle, PA-C  HYDROcodone-acetaminophen (NORCO/VICODIN) 5-325 MG tablet Take 2 tablets by mouth 3 (three) times daily as needed for severe pain. Patient not taking: Reported on 06/01/2022 05/07/22   Setzer, Edman Circle, PA-C  Lancets [facility] DELICA PLUS [id number]) MISC Apply topically daily. 05/03/22   [provider]  metoprolol tartrate (LOPRESSOR) 100 MG tablet Take 1 tablet (100 mg total) by mouth 2 (two) times daily. Patient not taking: Reported on 05/19/2022 05/07/22   Vaughan Basta, Edman Circle, PA-C  nicotine (NICODERM CQ - DOSED IN MG/24 HOURS) 14 mg/24hr patch Place 1 patch (14 mg total) onto the skin daily. Patient not taking: Reported on 05/19/2022 04/11/22   Elgergawy, Silver Huguenin, MD  [facility] VERIO test strip daily. 05/03/22   [provider]  traZODone (DESYREL) 150 MG tablet Take 1 tablet (150 mg total) by mouth at bedtime. 05/07/22   Setzer, Edman Circle, PA-C    Social History   Socioeconomic History   Marital status: Divorced    Spouse name: Not on file   Number of children: Not on file   Years of education: Not on file   Highest education level: Not on file  Occupational History   Occupation: disabled  Tobacco Use   Smoking status: Every Day    Packs/day: 0.50    Types: Cigarettes   Smokeless tobacco: Never  Vaping Use   Vaping Use: Never used  Substance and Sexual Activity  Alcohol use: Yes    Comment: socially   Drug use: Yes    Types: Marijuana    Comment: occasional   Sexual activity: Never  Other Topics Concern   Not on file  Social History Narrative   Not on file   Social Determinants of Health   Financial Resource Strain: Not on file  Food Insecurity: Not on file  Transportation Needs: Not on file  Physical Activity: Not on file  Stress: Not on file  Social Connections: Not on file  Intimate Partner Violence: Not on file     Family History  Problem Relation Age of Onset   Breast cancer Maternal Grandmother    Breast cancer Maternal Aunt     Review of Systems  Constitutional: Negative.   HENT: Negative.    Eyes:        Left vision loss  Respiratory: Negative.    Cardiovascular: Negative.   Gastrointestinal: Negative.   Musculoskeletal: Negative.   Skin: Negative.   Neurological:  Positive for headaches.  Endo/Heme/Allergies: Negative.   Psychiatric/Behavioral: Negative.        Physical Examination  Vitals:   05/31/22 1800 06/01/22 0143  BP: (!) 162/80 (!) 174/68  Pulse: 76 80  Resp: 17 16  Temp:    SpO2:  100% 100%   There is no height or weight on file to calculate BMI.  Physical Exam HENT:     Head: Normocephalic.     Comments: Tenderness over left temporal artery where there is a palpable pulse    Nose: Nose normal.  Eyes:     Comments: Cannot see out of left eye  Cardiovascular:     Rate and Rhythm: Normal rate.     Pulses:          Femoral pulses are 2+ on the right side and 2+ on the left side.      Dorsalis pedis pulses are 1+ on the right side and 0 on the left side.  Pulmonary:     Effort: Pulmonary effort is normal.  Abdominal:     General: Abdomen is flat.  Musculoskeletal:        General: Normal range of motion.     Cervical back: Normal range of motion and neck supple.     Right lower leg: No edema.     Left lower leg: No edema.  Skin:    General: Skin is warm.     Capillary Refill: Capillary refill takes less than 2 seconds.  Neurological:     General: No focal deficit present.     Mental Status: She is alert.  Psychiatric:        Mood and Affect: Mood normal.        Behavior: Behavior normal.        Thought Content: Thought content normal.        Judgment: Judgment normal.     CBC    Component Value Date/Time   WBC 6.6 05/31/2022 1410   RBC 3.76 (L) 05/31/2022 1410   HGB 10.9 (L) 05/31/2022 1410   HCT 34.6 (L) 05/31/2022 1410   PLT 266 05/31/2022 1410   MCV 92.0 05/31/2022 1410   MCH 29.0 05/31/2022 1410   MCHC 31.5 05/31/2022 1410   RDW 14.1 05/31/2022 1410   LYMPHSABS 3.1 05/31/2022 1410   MONOABS 0.5 05/31/2022 1410   EOSABS 0.2 05/31/2022 1410   BASOSABS 0.0 05/31/2022 1410    BMET    Component Value Date/Time   NA 138  05/31/2022 1410   K 3.9 05/31/2022 1410   CL 107 05/31/2022 1410   CO2 20 (L) 05/31/2022 1410   GLUCOSE 140 (H) 05/31/2022 1410   BUN 20 05/31/2022 1410   CREATININE 1.24 (H) 05/31/2022 1410   CALCIUM 7.1 (L) 05/31/2022 1410   GFRNONAA 50 (L) 05/31/2022 1410   GFRAA >60 06/18/2020 1139    COAGS: Lab Results   Component Value Date   INR 1.2 06/01/2022     Non-Invasive Vascular Imaging:   CTA IMPRESSION: 1. No emergent large vessel occlusion or hemodynamically significant stenosis of the head or neck. 2. Soft plaque narrowing the distal left ICA causing less than 50% stenosis. 3. Aortic Atherosclerosis (ICD10-I70.0).  MRI Brain IMPRESSION: 1. Mild chronic small vessel disease with old right pontine and left basal ganglia small vessel infarcts. 2. Normal orbits.  ASSESSMENT/PLAN: This is a 56 y.o. female here with left-sided vision loss concern for temporal arteritis.  Plan will be for left temporal artery biopsy tomorrow in the OR.  I discussed with her that this is for diagnostic purposes only and would not improve her symptoms.  We discussed the other risks of wound healing although this is a small percentage chance.  From a surgical standpoint she would be okay for discharge tomorrow p.m.  She will be n.p.o. past midnight tonight.  Courtne Lighty C. Donzetta Matters, MD [facility] Office: [phone] Pager: [phone]

## 2022-06-01 NOTE — ED Notes (Signed)
Son at bedside.

## 2022-06-01 NOTE — H&P (Signed)
History and Physical    Erin Jackson:[id number] DOB: 05/19/1966 DOA: 05/31/2022  PCP: Pcp, No  Patient coming from: Home.  Chief Complaint: Left vision loss.  HPI: Erin Jackson is a 56 y.o. female with history of recent admission for stroke last month, diabetes mellitus, hypertension, chronic kidney disease stage II, chronic pain, peripheral vascular disease presents to the ER after patient had persistent loss of vision since waking up in the morning on May 30, 2022.  Denies any weakness of the extremities.  Complains of left temporal headache same time.  ED Course: In the ER patient had MRI brain and MRI orbits CT angiogram of head and neck.  Neurology evaluated the patient at this time patient's symptoms are concerning for temporal arteritis started on prednisone admitted for further work-up including possible need for temporal artery biopsy.  Sed rate is 50.  Review of Systems: As per HPI, rest all negative.   Past Medical History:  Diagnosis Date   Anxiety    Chronic back pain    Diabetes mellitus without complication ([place])    GERD (gastroesophageal reflux disease)    Hypertension     Past Surgical History:  Procedure Laterality Date   ABDOMINAL HYSTERECTOMY     BREAST LUMPECTOMY Left 03/25/2022   Procedure: LEFT BREAST LUMPECTOMY;  Surgeon: Jovita Kussmaul, MD;  Location: [facility];  Service: General;  Laterality: Left;   BREAST REDUCTION SURGERY Bilateral 08/15/2018   Procedure: BILATERAL MAMMARY REDUCTION  (BREAST);  Surgeon: Cristine Polio, MD;  Location: [facility];  Service: Plastics;  Laterality: Bilateral;   BREAST REDUCTION SURGERY Left 09/25/2019   Procedure: EXCISION OF FAT NECROSIS OF LEFT BREAST;  Surgeon: Cristine Polio, MD;  Location: [facility];  Service: Plastics;  Laterality: Left;   CARPAL TUNNEL RELEASE Right 06/17/2020   right thumb CMC arthroplasty with tendon transfer and right carpal tunnel release.     CERVICAL SPINE SURGERY  2015   has a metal plate   FOOT SURGERY Left 2014   HAND SURGERY     LEFT HEART CATH AND CORONARY ANGIOGRAPHY N/A 04/09/2022   Procedure: LEFT HEART CATH AND CORONARY ANGIOGRAPHY;  Surgeon: Dixie Dials, MD;  Location: [facility];  Service: Cardiovascular;  Laterality: N/A;   REDUCTION MAMMAPLASTY Bilateral 07/2018     reports that she has been smoking cigarettes. She has been smoking an average of .5 packs per day. She has never used smokeless tobacco. She reports current alcohol use. She reports current drug use. Drug: Marijuana.  Allergies  Allergen Reactions   Cucumber Extract Anaphylaxis   Molds & Smuts Shortness Of Breath   Norvasc [Amlodipine] Other (See Comments)    Muscle became very tight   Other Anaphylaxis and Other (See Comments)    Tree nuts, walnuts, peanuts, pickles   Apple Juice Hives   Aspirin Rash   Ibuprofen Rash   Niacin And Related Rash    Family History  Problem Relation Age of Onset   Breast cancer Maternal Grandmother    Breast cancer Maternal Aunt     Prior to Admission medications   Medication Sig Start Date End Date Taking? Authorizing Provider  acetaminophen (TYLENOL) 325 MG tablet Take 2 tablets (650 mg total) by mouth every 4 (four) hours as needed for fever or mild pain. 05/07/22  Yes Setzer, Edman Circle, PA-C  allopurinol (ZYLOPRIM) 100 MG tablet Take 100 mg by mouth daily. 05/03/22  Yes [provider]  alprazolam (XANAX) 2 MG tablet Take 0.5 tablets (1 mg total) by mouth 2 (two) times daily as needed for anxiety. 05/07/22  Yes Setzer, Edman Circle, PA-C  atorvastatin (LIPITOR) 80 MG tablet Take 1 tablet (80 mg total) by mouth daily. 05/07/22  Yes Setzer, Edman Circle, PA-C  cetirizine (ZYRTEC ALLERGY) 10 MG tablet Take 1 tablet (10 mg total) by mouth daily. Patient taking differently: Take 10 mg by mouth daily as needed for allergies. 04/14/22  Yes Valarie Merino, MD  cloNIDine (CATAPRES) 0.1 MG tablet Take 1  tablet (0.1 mg total) by mouth 2 (two) times daily. 05/07/22  Yes Setzer, Edman Circle, PA-C  clopidogrel (PLAVIX) 75 MG tablet Take 1 tablet (75 mg total) by mouth daily. 05/07/22  Yes Setzer, Edman Circle, PA-C  EPINEPHRINE 0.3 mg/0.3 mL IJ SOAJ injection Inject 0.3 mLs (0.3 mg total) into the muscle once as needed (allergic reaction). Patient taking differently: Inject 0.3 mg into the muscle as needed for anaphylaxis (allergic reaction). 10/04/18  Yes Fabian November, MD  HYDROcodone-acetaminophen (NORCO) 10-325 MG tablet Take 1 tablet by mouth 3 (three) times daily as needed for moderate pain. 05/09/22  Yes [provider]  hydrOXYzine (ATARAX) 25 MG tablet Take 25 mg by mouth 3 (three) times daily. 04/17/22  Yes [provider]  meclizine (ANTIVERT) 12.5 MG tablet Take 1 tablet (12.5 mg total) by mouth 2 (two) times daily as needed for dizziness. 05/07/22  Yes Setzer, Edman Circle, PA-C  metFORMIN (GLUCOPHAGE) 500 MG tablet Take 1 tablet (500 mg total) by mouth 2 (two) times daily with a meal. 05/08/22  Yes Setzer, Edman Circle, PA-C  methocarbamol (ROBAXIN) 500 MG tablet Take 1 tablet (500 mg total) by mouth every 6 (six) hours as needed for muscle spasms. 05/19/22  Yes Jennye Boroughs, MD  NARCAN 4 MG/0.1ML LIQD nasal spray kit Place 0.4 mg into the nose as needed (opoid overdose). 01/08/20  Yes [provider]  nitroGLYCERIN (NITROSTAT) 0.4 MG SL tablet Place 1 tablet (0.4 mg total) under the tongue every 5 (five) minutes as needed for chest pain. 04/10/22  Yes Elgergawy, Silver Huguenin, MD  omeprazole (PRILOSEC) 40 MG capsule Take 40 mg by mouth 2 (two) times daily. 05/09/22  Yes [provider]  prochlorperazine (COMPAZINE) 5 MG tablet Take 1-2 tablets (5-10 mg total) by mouth every 6 (six) hours as needed for nausea. 05/07/22  Yes Setzer, Edman Circle, PA-C  Vitamin D, Ergocalciferol, (DRISDOL) 1.25 MG (50000 UNIT) CAPS capsule Take 1 capsule (50,000 Units total) by mouth every 7 (seven)  days. Patient taking differently: Take 50,000 Units by mouth every Monday. 05/08/22  Yes Setzer, Edman Circle, PA-C  HYDROcodone-acetaminophen (NORCO/VICODIN) 5-325 MG tablet Take 2 tablets by mouth 3 (three) times daily as needed for severe pain. Patient not taking: Reported on 06/01/2022 05/07/22   Setzer, Edman Circle, PA-C  Lancets [facility] DELICA PLUS [id number]) MISC Apply topically daily. 05/03/22   [provider]  metoprolol tartrate (LOPRESSOR) 100 MG tablet Take 1 tablet (100 mg total) by mouth 2 (two) times daily. Patient not taking: Reported on 05/19/2022 05/07/22   Vaughan Basta, Edman Circle, PA-C  nicotine (NICODERM CQ - DOSED IN MG/24 HOURS) 14 mg/24hr patch Place 1 patch (14 mg total) onto the skin daily. Patient not taking: Reported on 05/19/2022 04/11/22   Elgergawy, Silver Huguenin, MD  [facility] VERIO test strip daily. 05/03/22   [provider]  traZODone (DESYREL) 150 MG tablet Take 1 tablet (150 mg total) by mouth at  bedtime. 05/07/22   Setzer, Edman Circle, PA-C    Physical Exam: Constitutional: Moderately built and nourished. Vitals:   05/31/22 1730 05/31/22 1745 05/31/22 1800 06/01/22 0143  BP: (!) 161/83 (!) 171/87 (!) 162/80 (!) 174/68  Pulse: 75 75 76 80  Resp: (!) _0 Temp:      TempSrc:      SpO2: 100% 100% 100% 100%   Eyes: Anicteric no pallor.  Patient is unable to see anything on the left eye. ENMT: No discharge from the ears eyes nose and mouth. Neck: No mass felt.  No neck rigidity. Respiratory: No rhonchi or crepitations. Cardiovascular: S1-S2 heard. Abdomen: Soft nontender bowel sound present. Musculoskeletal: No edema. Skin: No rash. Neurologic: Alert awake oriented to time place and person.  Moves all extremities.  Not able to see from the left eye. Psychiatric: Appears normal per normal affect.   Labs on Admission: I have personally reviewed following labs and imaging studies  CBC: Recent Labs  Lab 05/29/22 1158 05/31/22 1410  WBC 5.8 6.6   NEUTROABS 2.3 2.9  HGB 11.5* 10.9*  HCT 36.8 34.6*  MCV 91.5 92.0  PLT 280 858   Basic Metabolic Panel: Recent Labs  Lab 05/29/22 1158 05/31/22 1410  NA 139 138  K 3.8 3.9  CL 105 107  CO2 23 20*  GLUCOSE 184* 140*  BUN 16 20  CREATININE 1.24* 1.24*  CALCIUM 7.2* 7.1*   GFR: Estimated Creatinine Clearance: 53.5 mL/min (A) (by C-G formula based on SCr of 1.24 mg/dL (H)). Liver Function Tests: Recent Labs  Lab 05/29/22 1158 05/31/22 1410  AST 17 21  ALT 12 10  ALKPHOS 59 72  BILITOT 0.5 0.4  PROT 6.7 6.5  ALBUMIN 3.0* 2.9*   No results for input(s): "LIPASE", "AMYLASE" in the last 168 hours. No results for input(s): "AMMONIA" in the last 168 hours. Coagulation Profile: Recent Labs  Lab 06/01/22 0120  INR 1.2   Cardiac Enzymes: No results for input(s): "CKTOTAL", "CKMB", "CKMBINDEX", "TROPONINI" in the last 168 hours. BNP (last 3 results) No results for input(s): "PROBNP" in the last 8760 hours. HbA1C: No results for input(s): "HGBA1C" in the last 72 hours. CBG: No results for input(s): "GLUCAP" in the last 168 hours. Lipid Profile: No results for input(s): "CHOL", "HDL", "LDLCALC", "TRIG", "CHOLHDL", "LDLDIRECT" in the last 72 hours. Thyroid Function Tests: No results for input(s): "TSH", "T4TOTAL", "FREET4", "T3FREE", "THYROIDAB" in the last 72 hours. Anemia Panel: No results for input(s): "VITAMINB12", "FOLATE", "FERRITIN", "TIBC", "IRON", "RETICCTPCT" in the last 72 hours. Urine analysis:    Component Value Date/Time   COLORURINE YELLOW 05/31/2022 2050   APPEARANCEUR CLEAR 05/31/2022 2050   LABSPEC 1.023 05/31/2022 2050   PHURINE 5.0 05/31/2022 2050   GLUCOSEU NEGATIVE 05/31/2022 2050   [place] NEGATIVE 05/31/2022 2050   [place] NEGATIVE 05/31/2022 2050   [place] 05/31/2022 2050   PROTEINUR >=300 (A) 05/31/2022 2050   NITRITE NEGATIVE 05/31/2022 2050   LEUKOCYTESUR NEGATIVE 05/31/2022 2050   Sepsis  Labs: _1 (procalcitonin:4,lacticidven:4) )No results found for this or any previous visit (from the past 240 hour(s)).   Radiological Exams on Admission: CT ANGIO HEAD NECK W WO CM  Result Date: 05/31/2022 CLINICAL DATA:  Left eye vision loss for 1 week EXAM: CT ANGIOGRAPHY HEAD AND NECK TECHNIQUE: Multidetector CT imaging of the head and neck was performed using the standard protocol during bolus administration of intravenous contrast. Multiplanar CT image reconstructions and MIPs were obtained to evaluate the vascular  anatomy. Carotid stenosis measurements (when applicable) are obtained utilizing NASCET criteria, using the distal internal carotid diameter as the denominator. RADIATION DOSE REDUCTION: This exam was performed according to the departmental dose-optimization program which includes automated exposure control, adjustment of the mA and/or kV according to patient size and/or use of iterative reconstruction technique. CONTRAST:  74m OMNIPAQUE IOHEXOL 350 MG/ML SOLN COMPARISON:  None Available. FINDINGS: CT HEAD FINDINGS Brain: There is no mass, hemorrhage or extra-axial collection. The size and configuration of the ventricles and extra-axial CSF spaces are normal. There is no acute or chronic infarction. The brain parenchyma is normal. Skull: The visualized skull base, calvarium and extracranial soft tissues are normal. Sinuses/Orbits: No fluid levels or advanced mucosal thickening of the visualized paranasal sinuses. No mastoid or middle ear effusion. The orbits are normal. CTA NECK FINDINGS SKELETON: There is no bony spinal canal stenosis. No lytic or blastic lesion. OTHER NECK: Normal pharynx, larynx and major salivary glands. No cervical lymphadenopathy. Unremarkable thyroid gland. UPPER CHEST: No pneumothorax or pleural effusion. No nodules or masses. AORTIC ARCH: There is calcific atherosclerosis of the aortic arch. There is no aneurysm, dissection or hemodynamically significant  stenosis of the visualized portion of the aorta. Conventional 3 vessel aortic branching pattern. The visualized proximal subclavian arteries are widely patent. RIGHT CAROTID SYSTEM: Normal without aneurysm, dissection or stenosis. LEFT CAROTID SYSTEM: No dissection, occlusion or aneurysm. Mild atherosclerotic calcification at the carotid bifurcation without hemodynamically significant stenosis. There is soft plaque narrowing the distal left ICA causing less than 50% stenosis. VERTEBRAL ARTERIES: Left dominant configuration. Both origins are clearly patent. There is no dissection, occlusion or flow-limiting stenosis to the skull base (V1-V3 segments). CTA HEAD FINDINGS POSTERIOR CIRCULATION: --Vertebral arteries: Normal V4 segments. --Inferior cerebellar arteries: Normal. --Basilar artery: Normal. --Superior cerebellar arteries: Normal. --Posterior cerebral arteries (PCA): Normal. ANTERIOR CIRCULATION: --Intracranial internal carotid arteries: Normal. --Anterior cerebral arteries (ACA): Normal. Both A1 segments are present. Patent anterior communicating artery (a-comm). --Middle cerebral arteries (MCA): Normal. VENOUS SINUSES: As permitted by contrast timing, patent. ANATOMIC VARIANTS: None Review of the MIP images confirms the above findings. IMPRESSION: 1. No emergent large vessel occlusion or hemodynamically significant stenosis of the head or neck. 2. Soft plaque narrowing the distal left ICA causing less than 50% stenosis. 3. Aortic Atherosclerosis (ICD10-I70.0). Electronically Signed   By: KUlyses JarredM.D.   On: 05/31/2022 23:34   MR BRAIN WO CONTRAST  Result Date: 05/31/2022 CLINICAL DATA:  Monocular vision loss EXAM: MRI HEAD AND ORBITS WITHOUT CONTRAST TECHNIQUE: Multiplanar, multi-echo pulse sequences of the brain and surrounding structures were acquired without intravenous contrast. Multiplanar, multi-echo pulse sequences of the orbits and surrounding structures were acquired including fat saturation  techniques, without intravenous contrast administration. COMPARISON:  None Available. FINDINGS: MRI HEAD FINDINGS Brain: No acute infarct, mass effect or extra-axial collection. No acute or chronic hemorrhage. There is multifocal hyperintense T2-weighted signal within the white matter. Parenchymal volume and CSF spaces are normal. There are old right pontine and left basal ganglia small vessel infarct. The midline structures are normal. Vascular: Major flow voids are preserved. Skull and upper cervical spine: Normal calvarium and skull base. Visualized upper cervical spine and soft tissues are normal. Sinuses/Orbits:No paranasal sinus fluid levels or advanced mucosal thickening. No mastoid or middle ear effusion. Normal orbits. MRI ORBITS FINDINGS Orbits: The globes are intact. Optic nerves are normal. Normal extraocular muscles and lacrimal glands. The bony orbit, preseptal soft tissues and the intra- and extraconal orbital fat are normal. Visualized  sinuses:  No fluid levels or advanced mucosal thickening. Soft tissues: Normal. IMPRESSION: 1. Mild chronic small vessel disease with old right pontine and left basal ganglia small vessel infarcts. 2. Normal orbits. Electronically Signed   By: Ulyses Jarred M.D.   On: 05/31/2022 23:03   MR ORBITS WO CONTRAST  Result Date: 05/31/2022 CLINICAL DATA:  Monocular vision loss EXAM: MRI HEAD AND ORBITS WITHOUT CONTRAST TECHNIQUE: Multiplanar, multi-echo pulse sequences of the brain and surrounding structures were acquired without intravenous contrast. Multiplanar, multi-echo pulse sequences of the orbits and surrounding structures were acquired including fat saturation techniques, without intravenous contrast administration. COMPARISON:  None Available. FINDINGS: MRI HEAD FINDINGS Brain: No acute infarct, mass effect or extra-axial collection. No acute or chronic hemorrhage. There is multifocal hyperintense T2-weighted signal within the white matter. Parenchymal volume  and CSF spaces are normal. There are old right pontine and left basal ganglia small vessel infarct. The midline structures are normal. Vascular: Major flow voids are preserved. Skull and upper cervical spine: Normal calvarium and skull base. Visualized upper cervical spine and soft tissues are normal. Sinuses/Orbits:No paranasal sinus fluid levels or advanced mucosal thickening. No mastoid or middle ear effusion. Normal orbits. MRI ORBITS FINDINGS Orbits: The globes are intact. Optic nerves are normal. Normal extraocular muscles and lacrimal glands. The bony orbit, preseptal soft tissues and the intra- and extraconal orbital fat are normal. Visualized sinuses:  No fluid levels or advanced mucosal thickening. Soft tissues: Normal. IMPRESSION: 1. Mild chronic small vessel disease with old right pontine and left basal ganglia small vessel infarcts. 2. Normal orbits. Electronically Signed   By: Ulyses Jarred M.D.   On: 05/31/2022 23:03    EKG: Independently reviewed.  Normal sinus rhythm.  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  Appreciate neurology consult.  Patient has been placed on prednisone.  Will need vascular surgery consult in the morning for the parotid biopsy. Recent stroke on statins and Plavix. Hypertension on clonidine.  We will keep patient.  IV hydralazine.   Diabetes mellitus type 2 since patient is on prednisone patient likely may need long-acting insulin.  Last hemoglobin A1c was 6.30-monthago. Chronic kidney disease stage III creatinine appears to be at baseline. History of systolic heart failure appears compensated. Chronic pain on hydrocodone.   DVT prophylaxis: SCDs.  Avoiding anticoagulation in anticipation of biopsy. Code Status: Full  code. Family Communication: Discussed with patient. Disposition Plan: Home. Consults called: Neurology. Admission status: Observation.   ARise PatienceMD [facility] Pager [phone]  If 7PM-7AM, please contact night-coverage [email or web address] Password [facility] 06/01/2022, 5:17 AM

## 2022-06-01 NOTE — ED Notes (Signed)
Pt states she is about to have a panic attack, RN gave pt xanax.

## 2022-06-01 NOTE — Consult Note (Signed)
Neurology Consultation Note  Consult Requested by: Dr. Alvino Chapel  Reason for Consult: left vision loss  Consult Date: 06/01/22   The history was obtained from the pt.  During history and examination, all items were able to obtain unless otherwise noted.  History of Present Illness:  Erin Jackson is a 56 y.o. African American female with PMH of hypertension, diabetes, CAD, cardiomyopathy, PVD, hyperlipidemia, smoker, obesity, stroke in 04/2022 presented to ED for left vision loss.  Per patient, she started to have dizziness, lightheadedness since Monday, but no vertigo.  Around Friday she started to have left temporal headache, scalp tenderness on touch, pain also extending to left orbital area.  Saturday morning she woke up with the left vision loss, cannot see anything out of the left eye.  Right eye not affected.  Denies any arm or leg weakness numbness or speech difficulty or diplopia or jaw claudication.  Came in 05/29/22 ED for evaluation but left AMA.  CT head 6/9 no acute process.  Today MRI brain and orbital no acute finding.  CT head and neck no LVO, soft plaque narrowing distal left ICA but less than 50% stenosis.  Patient had a stroke in 04/2022, MRI showed right pontine infarct and punctate left hippocampus and frontal white matter infarcts, concerning for small vessel disease versus cardioembolic source.  MRI moderate left M2 stenosis, carotid Doppler negative.  EF 40 to 45%.  EEG no seizure, LDL 102, A1c 6.8.  UDS positive for THC.  Put on Plavix alone due to aspirin allergy.  Continued on Lipitor 80 and add on Zetia 10 on discharge.  LSN: Possible Monday 05/25/2022 tPA Given: No: Outside window, MRI negative for stroke  Past Medical History:  Diagnosis Date   Anxiety    Chronic back pain    Diabetes mellitus without complication ([place])    GERD (gastroesophageal reflux disease)    Hypertension     Past Surgical History:  Procedure Laterality Date   ABDOMINAL HYSTERECTOMY      BREAST LUMPECTOMY Left 03/25/2022   Procedure: LEFT BREAST LUMPECTOMY;  Surgeon: Jovita Kussmaul, MD;  Location: [facility];  Service: General;  Laterality: Left;   BREAST REDUCTION SURGERY Bilateral 08/15/2018   Procedure: BILATERAL MAMMARY REDUCTION  (BREAST);  Surgeon: Cristine Polio, MD;  Location: [facility];  Service: Plastics;  Laterality: Bilateral;   BREAST REDUCTION SURGERY Left 09/25/2019   Procedure: EXCISION OF FAT NECROSIS OF LEFT BREAST;  Surgeon: Cristine Polio, MD;  Location: [facility];  Service: Plastics;  Laterality: Left;   CARPAL TUNNEL RELEASE Right 06/17/2020   right thumb CMC arthroplasty with tendon transfer and right carpal tunnel release.    CERVICAL SPINE SURGERY  2015   has a metal plate   FOOT SURGERY Left 2014   HAND SURGERY     LEFT HEART CATH AND CORONARY ANGIOGRAPHY N/A 04/09/2022   Procedure: LEFT HEART CATH AND CORONARY ANGIOGRAPHY;  Surgeon: Dixie Dials, MD;  Location: [facility];  Service: Cardiovascular;  Laterality: N/A;   REDUCTION MAMMAPLASTY Bilateral 07/2018    Family History  Problem Relation Age of Onset   Breast cancer Maternal Grandmother    Breast cancer Maternal Aunt     Social History:  reports that she has been smoking cigarettes. She has been smoking an average of .5 packs per day. She has never used smokeless tobacco. She reports current alcohol use. She reports current drug use. Drug: Marijuana.  Allergies:  Allergies  Allergen Reactions   Cucumber Extract Anaphylaxis   Molds & Smuts Shortness Of Breath   Norvasc [Amlodipine] Other (See Comments)    Muscle became very tight   Other Anaphylaxis and Other (See Comments)    Tree nuts, walnuts, peanuts, pickles   Apple Juice Hives   Aspirin Rash   Ibuprofen Rash   Niacin And Related Rash    Current Facility-Administered Medications on File Prior to Encounter  Medication Dose Route Frequency Provider Last Rate Last Admin    lidocaine (LIDODERM) 5 % 1 patch  1 patch Transdermal Q24H Fanny Dance, MD       Current Outpatient Medications on File Prior to Encounter  Medication Sig Dispense Refill   acetaminophen (TYLENOL) 325 MG tablet Take 2 tablets (650 mg total) by mouth every 4 (four) hours as needed for fever or mild pain.     allopurinol (ZYLOPRIM) 100 MG tablet Take 100 mg by mouth daily.     alprazolam (XANAX) 2 MG tablet Take 0.5 tablets (1 mg total) by mouth 2 (two) times daily as needed for anxiety. 20 tablet 0   atorvastatin (LIPITOR) 80 MG tablet Take 1 tablet (80 mg total) by mouth daily. 30 tablet 0   cetirizine (ZYRTEC ALLERGY) 10 MG tablet Take 1 tablet (10 mg total) by mouth daily. 30 tablet 1   cloNIDine (CATAPRES) 0.1 MG tablet Take 1 tablet (0.1 mg total) by mouth 2 (two) times daily. 60 tablet 0   clopidogrel (PLAVIX) 75 MG tablet Take 1 tablet (75 mg total) by mouth daily. 30 tablet 0   EPINEPHRINE 0.3 mg/0.3 mL IJ SOAJ injection Inject 0.3 mLs (0.3 mg total) into the muscle once as needed (allergic reaction). (Patient taking differently: Inject 0.3 mg into the muscle as needed for anaphylaxis (allergic reaction).) 1 Device 1   HYDROcodone-acetaminophen (NORCO) 10-325 MG tablet Take 1 tablet by mouth 3 (three) times daily as needed.     HYDROcodone-acetaminophen (NORCO/VICODIN) 5-325 MG tablet Take 2 tablets by mouth 3 (three) times daily as needed for severe pain. 30 tablet 0   hydrOXYzine (ATARAX) 25 MG tablet Take 25 mg by mouth 3 (three) times daily.     Lancets (ONETOUCH DELICA PLUS LANCET33G) MISC Apply topically daily.     meclizine (ANTIVERT) 12.5 MG tablet Take 1 tablet (12.5 mg total) by mouth 2 (two) times daily as needed for dizziness. 30 tablet 0   metFORMIN (GLUCOPHAGE) 500 MG tablet Take 1 tablet (500 mg total) by mouth 2 (two) times daily with a meal. 60 tablet 0   methocarbamol (ROBAXIN) 500 MG tablet Take 1 tablet (500 mg total) by mouth every 6 (six) hours as needed for  muscle spasms. 30 tablet 0   metoprolol tartrate (LOPRESSOR) 100 MG tablet Take 1 tablet (100 mg total) by mouth 2 (two) times daily. (Patient not taking: Reported on 05/19/2022) 60 tablet 0   NARCAN 4 MG/0.1ML LIQD nasal spray kit Place 0.4 mg into the nose as needed (opoid overdose).     nicotine (NICODERM CQ - DOSED IN MG/24 HOURS) 14 mg/24hr patch Place 1 patch (14 mg total) onto the skin daily. (Patient not taking: Reported on 05/19/2022) 28 patch 0   nitroGLYCERIN (NITROSTAT) 0.4 MG SL tablet Place 1 tablet (0.4 mg total) under the tongue every 5 (five) minutes as needed for chest pain. (Patient not taking: Reported on 05/19/2022) 25 tablet 0   omeprazole (PRILOSEC) 40 MG capsule Take 40 mg by mouth 2 (two) times daily.  ONETOUCH VERIO test strip daily.     prochlorperazine (COMPAZINE) 5 MG tablet Take 1-2 tablets (5-10 mg total) by mouth every 6 (six) hours as needed for nausea. 30 tablet 0   traZODone (DESYREL) 150 MG tablet Take 1 tablet (150 mg total) by mouth at bedtime. 30 tablet 0   Vitamin D, Ergocalciferol, (DRISDOL) 1.25 MG (50000 UNIT) CAPS capsule Take 1 capsule (50,000 Units total) by mouth every 7 (seven) days. 5 capsule     Review of Systems: A full ROS was attempted today and was able to be performed.  Systems assessed include - Constitutional, Eyes, HENT, Respiratory, Cardiovascular, Gastrointestinal, Genitourinary, Integument/breast, Hematologic/lymphatic, Musculoskeletal, Neurological, Behavioral/Psych, Endocrine, Allergic/Immunologic - with pertinent responses as per HPI.  Physical Examination: Temp:  [98.9 F (37.2 C)] 98.9 F (37.2 C) (06/11 1309) Pulse Rate:  [75-91] 76 (06/11 1800) Resp:  [12-24] 17 (06/11 1800) BP: (147-171)/(80-108) 162/80 (06/11 1800) SpO2:  [97 %-100 %] 100 % (06/11 1800)  General - well nourished, well developed, in mild distress due to headache.    Ophthalmologic - fundi not visualized due to noncooperation.    Cardiovascular - regular  rhythm and rate  Mental Status -  Level of arousal and orientation to time, place, and person were intact. Language including expression, naming, repetition, comprehension was assessed and found intact. Fund of Knowledge was assessed and was intact.  Cranial Nerves II - XII - II - Vision intact OD, total blindness OS. III, IV, VI - Extraocular movements intact.  Left pupil sluggish pupillary reflexes V - Facial sensation intact bilaterally.  Tenderness to touch on the left temporal, parietal, orbital areas.  No hardening of left temporal artery palpated VII - Facial movement intact bilaterally. VIII - Hearing & vestibular intact bilaterally. X - Palate elevates symmetrically. XI - Chin turning & shoulder shrug intact bilaterally. XII - Tongue protrusion intact.  Motor Strength - The patient's strength was normal in all extremities and pronator drift was absent.   Motor Tone & Bulk - Muscle tone was assessed at the neck and appendages and was normal.  Bulk was normal and fasciculations were absent.   Reflexes - The patient's reflexes were normal in all extremities and she had no pathological reflexes.  Sensory - Light touch, temperature/pinprick were assessed and were normal.    Coordination - The patient had normal movements in the hands with no ataxia or dysmetria.  Tremor was absent.  Gait and Station - deferred  Data Reviewed: CT ANGIO HEAD NECK W WO CM  Result Date: 05/31/2022 CLINICAL DATA:  Left eye vision loss for 1 week EXAM: CT ANGIOGRAPHY HEAD AND NECK TECHNIQUE: Multidetector CT imaging of the head and neck was performed using the standard protocol during bolus administration of intravenous contrast. Multiplanar CT image reconstructions and MIPs were obtained to evaluate the vascular anatomy. Carotid stenosis measurements (when applicable) are obtained utilizing NASCET criteria, using the distal internal carotid diameter as the denominator. RADIATION DOSE REDUCTION: This  exam was performed according to the departmental dose-optimization program which includes automated exposure control, adjustment of the mA and/or kV according to patient size and/or use of iterative reconstruction technique. CONTRAST:  22mL OMNIPAQUE IOHEXOL 350 MG/ML SOLN COMPARISON:  None Available. FINDINGS: CT HEAD FINDINGS Brain: There is no mass, hemorrhage or extra-axial collection. The size and configuration of the ventricles and extra-axial CSF spaces are normal. There is no acute or chronic infarction. The brain parenchyma is normal. Skull: The visualized skull base, calvarium and extracranial soft tissues  are normal. Sinuses/Orbits: No fluid levels or advanced mucosal thickening of the visualized paranasal sinuses. No mastoid or middle ear effusion. The orbits are normal. CTA NECK FINDINGS SKELETON: There is no bony spinal canal stenosis. No lytic or blastic lesion. OTHER NECK: Normal pharynx, larynx and major salivary glands. No cervical lymphadenopathy. Unremarkable thyroid gland. UPPER CHEST: No pneumothorax or pleural effusion. No nodules or masses. AORTIC ARCH: There is calcific atherosclerosis of the aortic arch. There is no aneurysm, dissection or hemodynamically significant stenosis of the visualized portion of the aorta. Conventional 3 vessel aortic branching pattern. The visualized proximal subclavian arteries are widely patent. RIGHT CAROTID SYSTEM: Normal without aneurysm, dissection or stenosis. LEFT CAROTID SYSTEM: No dissection, occlusion or aneurysm. Mild atherosclerotic calcification at the carotid bifurcation without hemodynamically significant stenosis. There is soft plaque narrowing the distal left ICA causing less than 50% stenosis. VERTEBRAL ARTERIES: Left dominant configuration. Both origins are clearly patent. There is no dissection, occlusion or flow-limiting stenosis to the skull base (V1-V3 segments). CTA HEAD FINDINGS POSTERIOR CIRCULATION: --Vertebral arteries: Normal V4  segments. --Inferior cerebellar arteries: Normal. --Basilar artery: Normal. --Superior cerebellar arteries: Normal. --Posterior cerebral arteries (PCA): Normal. ANTERIOR CIRCULATION: --Intracranial internal carotid arteries: Normal. --Anterior cerebral arteries (ACA): Normal. Both A1 segments are present. Patent anterior communicating artery (a-comm). --Middle cerebral arteries (MCA): Normal. VENOUS SINUSES: As permitted by contrast timing, patent. ANATOMIC VARIANTS: None Review of the MIP images confirms the above findings. IMPRESSION: 1. No emergent large vessel occlusion or hemodynamically significant stenosis of the head or neck. 2. Soft plaque narrowing the distal left ICA causing less than 50% stenosis. 3. Aortic Atherosclerosis (ICD10-I70.0). Electronically Signed   By: Ulyses Jarred M.D.   On: 05/31/2022 23:34   MR BRAIN WO CONTRAST  Result Date: 05/31/2022 CLINICAL DATA:  Monocular vision loss EXAM: MRI HEAD AND ORBITS WITHOUT CONTRAST TECHNIQUE: Multiplanar, multi-echo pulse sequences of the brain and surrounding structures were acquired without intravenous contrast. Multiplanar, multi-echo pulse sequences of the orbits and surrounding structures were acquired including fat saturation techniques, without intravenous contrast administration. COMPARISON:  None Available. FINDINGS: MRI HEAD FINDINGS Brain: No acute infarct, mass effect or extra-axial collection. No acute or chronic hemorrhage. There is multifocal hyperintense T2-weighted signal within the white matter. Parenchymal volume and CSF spaces are normal. There are old right pontine and left basal ganglia small vessel infarct. The midline structures are normal. Vascular: Major flow voids are preserved. Skull and upper cervical spine: Normal calvarium and skull base. Visualized upper cervical spine and soft tissues are normal. Sinuses/Orbits:No paranasal sinus fluid levels or advanced mucosal thickening. No mastoid or middle ear effusion. Normal  orbits. MRI ORBITS FINDINGS Orbits: The globes are intact. Optic nerves are normal. Normal extraocular muscles and lacrimal glands. The bony orbit, preseptal soft tissues and the intra- and extraconal orbital fat are normal. Visualized sinuses:  No fluid levels or advanced mucosal thickening. Soft tissues: Normal. IMPRESSION: 1. Mild chronic small vessel disease with old right pontine and left basal ganglia small vessel infarcts. 2. Normal orbits. Electronically Signed   By: Ulyses Jarred M.D.   On: 05/31/2022 23:03   MR ORBITS WO CONTRAST  Result Date: 05/31/2022 CLINICAL DATA:  Monocular vision loss EXAM: MRI HEAD AND ORBITS WITHOUT CONTRAST TECHNIQUE: Multiplanar, multi-echo pulse sequences of the brain and surrounding structures were acquired without intravenous contrast. Multiplanar, multi-echo pulse sequences of the orbits and surrounding structures were acquired including fat saturation techniques, without intravenous contrast administration. COMPARISON:  None Available. FINDINGS: MRI HEAD FINDINGS  Brain: No acute infarct, mass effect or extra-axial collection. No acute or chronic hemorrhage. There is multifocal hyperintense T2-weighted signal within the white matter. Parenchymal volume and CSF spaces are normal. There are old right pontine and left basal ganglia small vessel infarct. The midline structures are normal. Vascular: Major flow voids are preserved. Skull and upper cervical spine: Normal calvarium and skull base. Visualized upper cervical spine and soft tissues are normal. Sinuses/Orbits:No paranasal sinus fluid levels or advanced mucosal thickening. No mastoid or middle ear effusion. Normal orbits. MRI ORBITS FINDINGS Orbits: The globes are intact. Optic nerves are normal. Normal extraocular muscles and lacrimal glands. The bony orbit, preseptal soft tissues and the intra- and extraconal orbital fat are normal. Visualized sinuses:  No fluid levels or advanced mucosal thickening. Soft tissues:  Normal. IMPRESSION: 1. Mild chronic small vessel disease with old right pontine and left basal ganglia small vessel infarcts. 2. Normal orbits. Electronically Signed   By: Ulyses Jarred M.D.   On: 05/31/2022 23:03   CT Head Wo Contrast  Result Date: 05/29/2022 CLINICAL DATA:  TIA dizziness EXAM: CT HEAD WITHOUT CONTRAST TECHNIQUE: Contiguous axial images were obtained from the base of the skull through the vertex without intravenous contrast. RADIATION DOSE REDUCTION: This exam was performed according to the departmental dose-optimization program which includes automated exposure control, adjustment of the mA and/or kV according to patient size and/or use of iterative reconstruction technique. COMPARISON:  04/20/2022 FINDINGS: Brain: No evidence of acute infarction, hemorrhage, cerebral edema, mass, mass effect, or midline shift. No hydrocephalus or extra-axial fluid collection. Hypodensity in the pons and left periventricular frontal lobe correlate with the acute infarcts from 04/21/2022. Redemonstrated lacunar infarcts in the left lentiform nucleus. Vascular: No hyperdense vessel. Skull: Normal. Negative for fracture or focal lesion. Sinuses/Orbits: Small right ethmoid osteoma. No acute finding in the paranasal sinuses or orbits. Other: The mastoid air cells are well aerated. IMPRESSION: No acute intracranial process. Electronically Signed   By: Merilyn Baba M.D.   On: 05/29/2022 13:17   VAS [place] ABI WITH/WO TBI  Result Date: 05/26/2022  LOWER EXTREMITY DOPPLER STUDY Patient Name:  Erin Jackson  Date of Exam:   05/26/2022 Medical Rec #: [id number]       Accession #:    [id number] Date of Birth: 1966/08/03       Patient Gender: F Patient Age:   60 years Exam Location:  [facility] Procedure:      VAS [place] ABI WITH/WO TBI Referring Phys: Jamelle Haring --------------------------------------------------------------------------------  Indications: Claudication, peripheral artery disease, and CTA  04/20/22:              Chronically occluded IMA, left internal iliac artery, and. High Risk Factors: Hypertension, Diabetes, current smoker, prior CVA.  Performing Technologist: Ralene Cork RVT  Examination Guidelines: A complete evaluation includes at minimum, Doppler waveform signals and systolic blood pressure reading at the level of bilateral brachial, anterior tibial, and posterior tibial arteries, when vessel segments are accessible. Bilateral testing is considered an integral part of a complete examination. Photoelectric Plethysmograph (PPG) waveforms and toe systolic pressure readings are included as required and additional duplex testing as needed. Limited examinations for reoccurring indications may be performed as noted.  ABI Findings: +---------+------------------+-----+---------+--------+ Right    Rt Pressure (mmHg)IndexWaveform Comment  +---------+------------------+-----+---------+--------+ Brachial 151                                      +---------+------------------+-----+---------+--------+  PTA      0                 0.00 absent            +---------+------------------+-----+---------+--------+ DP       151               0.96 triphasic         +---------+------------------+-----+---------+--------+ Great Toe125               0.80                   +---------+------------------+-----+---------+--------+ +---------+------------------+-----+-------------------+-------+ Left     Lt Pressure (mmHg)IndexWaveform           Comment +---------+------------------+-----+-------------------+-------+ Brachial 157                                               +---------+------------------+-----+-------------------+-------+ PTA      0                 0.00 absent                     +---------+------------------+-----+-------------------+-------+ DP       52                0.33 dampened monophasic         +---------+------------------+-----+-------------------+-------+ Great Toe0                 0.00                            +---------+------------------+-----+-------------------+-------+ +-------+-----------+-----------+------------+------------+ ABI/TBIToday's ABIToday's TBIPrevious ABIPrevious TBI +-------+-----------+-----------+------------+------------+ Right  0.96       0.8        0.93        0.69         +-------+-----------+-----------+------------+------------+ Left   0.33       0          0.4         0            +-------+-----------+-----------+------------+------------+  Previous ABI on 04/08/22.  Summary: Right: Resting right ankle-brachial index is within normal range. The right toe-brachial index is normal. Left: Resting left ankle-brachial index indicates severe left lower extremity arterial disease. The left toe-brachial index is abnormal. *See table(s) above for measurements and observations.  Electronically signed by Monica Martinez MD on 05/26/2022 at 11:58:06 AM.    Final     Assessment: 56 y.o. female with PMH of hypertension, diabetes, CAD, cardiomyopathy, PVD, hyperlipidemia, smoker, obesity, stroke in 04/2022 presented to ED for left vision loss, also has left temporal headache, scalp tenderness on touch, pain also extending to left orbital area as well as left vision loss. Denies fever, shoulder pain or jaw claudication.  MRI brain and orbital no acute finding.  CT head and neck no LVO, soft plaque narrowing distal left ICA but less than 50% stenosis. On exam, total left eye vision loss, tenderness to touch on the left temporal, parietal, orbital areas.  However, no hardening of left temporal artery palpated.    Etiology for patient's symptoms concerning for temporal arteritis on the left, ESR and CRP are pending.  Will likely need temporal artery biopsy, and will put on empiric prednisone treatment.  Continue other home meds.  Plan: -Concerning for temporal  arteritis.  Recommend vascular surgery consult for temporal artery biopsy on the left -Empiric treatment of prednisone -Pending ESR and CRP -Continue other home meds -Discussed with EDP Dr. Alvino Chapel  Thank you for this consultation and allowing [place] to participate in the care of this patient.  Rosalin Hawking, MD PhD Stroke Neurology 06/01/2022 1:46 AM

## 2022-06-02 ENCOUNTER — Encounter (HOSPITAL_COMMUNITY): Payer: Self-pay | Admitting: Family Medicine

## 2022-06-02 ENCOUNTER — Inpatient Hospital Stay (HOSPITAL_COMMUNITY): Payer: Medicare Other | Admitting: Certified Registered Nurse Anesthetist

## 2022-06-02 ENCOUNTER — Ambulatory Visit: Payer: Medicare Other | Admitting: Vascular Surgery

## 2022-06-02 ENCOUNTER — Other Ambulatory Visit: Payer: Self-pay

## 2022-06-02 ENCOUNTER — Encounter (HOSPITAL_COMMUNITY)
Admission: EM | Disposition: A | Discharge status: Skilled Nursing Facility | Payer: Self-pay | Source: Home / Self Care | Attending: Family Medicine

## 2022-06-02 DIAGNOSIS — I5022 Chronic systolic (congestive) heart failure: Secondary | ICD-10-CM | POA: Diagnosis not present

## 2022-06-02 DIAGNOSIS — M316 Other giant cell arteritis: Secondary | ICD-10-CM

## 2022-06-02 DIAGNOSIS — I25119 Atherosclerotic heart disease of native coronary artery with unspecified angina pectoris: Secondary | ICD-10-CM

## 2022-06-02 DIAGNOSIS — I130 Hypertensive heart and chronic kidney disease with heart failure and stage 1 through stage 4 chronic kidney disease, or unspecified chronic kidney disease: Secondary | ICD-10-CM

## 2022-06-02 DIAGNOSIS — R531 Weakness: Secondary | ICD-10-CM | POA: Diagnosis not present

## 2022-06-02 DIAGNOSIS — N1832 Chronic kidney disease, stage 3b: Secondary | ICD-10-CM

## 2022-06-02 DIAGNOSIS — H547 Unspecified visual loss: Secondary | ICD-10-CM | POA: Diagnosis not present

## 2022-06-02 DIAGNOSIS — Z87891 Personal history of nicotine dependence: Secondary | ICD-10-CM

## 2022-06-02 HISTORY — PX: ARTERY BIOPSY: SHX891

## 2022-06-02 LAB — GLUCOSE, CAPILLARY
Glucose-Capillary: 152 mg/dL — ABNORMAL HIGH (ref 70–99)
Glucose-Capillary: 136 mg/dL — ABNORMAL HIGH (ref 70–99)
Glucose-Capillary: 130 mg/dL — ABNORMAL HIGH (ref 70–99)
Glucose-Capillary: 105 mg/dL — ABNORMAL HIGH (ref 70–99)
Glucose-Capillary: 345 mg/dL — ABNORMAL HIGH (ref 70–99)
Glucose-Capillary: 164 mg/dL — ABNORMAL HIGH (ref 70–99)

## 2022-06-02 LAB — BASIC METABOLIC PANEL
Anion gap: 9 (ref 5–15)
BUN: 25 mg/dL — ABNORMAL HIGH (ref 6–20)
CO2: 24 mmol/L (ref 22–32)
Calcium: 7.4 mg/dL — ABNORMAL LOW (ref 8.9–10.3)
Chloride: 105 mmol/L (ref 98–111)
Creatinine, Ser: 1.28 mg/dL — ABNORMAL HIGH (ref 0.44–1.00)
GFR, Estimated: 49 mL/min — ABNORMAL LOW (ref >60)
Glucose, Bld: 166 mg/dL — ABNORMAL HIGH (ref 70–99)
Potassium: 3.4 mmol/L — ABNORMAL LOW (ref 3.5–5.1)
Sodium: 138 mmol/L (ref 135–145)

## 2022-06-02 LAB — CBC
HCT: 32.6 % — ABNORMAL LOW (ref 36.0–46.0)
Hemoglobin: 10.5 g/dL — ABNORMAL LOW (ref 12.0–15.0)
MCH: 28.1 pg (ref 26.0–34.0)
MCHC: 32.2 g/dL (ref 30.0–36.0)
MCV: 87.2 fL (ref 80.0–100.0)
Platelets: 249 10*3/uL (ref 150–400)
RBC: 3.74 MIL/uL — ABNORMAL LOW (ref 3.87–5.11)
RDW: 14.1 % (ref 11.5–15.5)
WBC: 8.6 10*3/uL (ref 4.0–10.5)
nRBC: 0 % (ref 0.0–0.2)

## 2022-06-02 LAB — SEDIMENTATION RATE: Sed Rate: 69 mm/hr — ABNORMAL HIGH (ref 0–22)

## 2022-06-02 SURGERY — BIOPSY TEMPORAL ARTERY
Anesthesia: Monitor Anesthesia Care | Site: Head | Laterality: Left

## 2022-06-02 MED ORDER — INSULIN GLARGINE-YFGN 100 UNIT/ML ~~LOC~~ SOLN
10.0000 [IU] | Freq: Every day | SUBCUTANEOUS | Status: DC
  Administered 2022-06-03: 10 [IU] via SUBCUTANEOUS
  Filled 2022-06-02 (×3): qty 0.1

## 2022-06-02 MED ORDER — MIDAZOLAM HCL 2 MG/2ML IJ SOLN
INTRAMUSCULAR | Status: AC
  Filled 2022-06-02: qty 2

## 2022-06-02 MED ORDER — FENTANYL CITRATE (PF) 250 MCG/5ML IJ SOLN
INTRAMUSCULAR | Status: DC | PRN
  Administered 2022-06-02 (×2): 25 ug via INTRAVENOUS

## 2022-06-02 MED ORDER — PROPOFOL 10 MG/ML IV BOLUS
INTRAVENOUS | Status: AC
  Filled 2022-06-02: qty 20

## 2022-06-02 MED ORDER — DEXAMETHASONE SODIUM PHOSPHATE 10 MG/ML IJ SOLN
INTRAMUSCULAR | Status: AC
  Filled 2022-06-02: qty 1

## 2022-06-02 MED ORDER — PROPOFOL 500 MG/50ML IV EMUL
INTRAVENOUS | Status: DC | PRN
  Administered 2022-06-02: 75 ug/kg/min via INTRAVENOUS

## 2022-06-02 MED ORDER — FENTANYL CITRATE (PF) 250 MCG/5ML IJ SOLN
INTRAMUSCULAR | Status: AC
  Filled 2022-06-02: qty 5

## 2022-06-02 MED ORDER — LIDOCAINE HCL (PF) 1 % IJ SOLN
INTRAMUSCULAR | Status: DC | PRN
  Administered 2022-06-02: 4 mL

## 2022-06-02 MED ORDER — PROPOFOL 10 MG/ML IV BOLUS
INTRAVENOUS | Status: DC | PRN
  Administered 2022-06-02 (×3): 10 mg via INTRAVENOUS

## 2022-06-02 MED ORDER — 0.9 % SODIUM CHLORIDE (POUR BTL) OPTIME
TOPICAL | Status: DC | PRN
  Administered 2022-06-02: 1000 mL

## 2022-06-02 MED ORDER — LACTATED RINGERS IV SOLN: INTRAVENOUS | Status: DC

## 2022-06-02 MED ORDER — MIDAZOLAM HCL 2 MG/2ML IJ SOLN
INTRAMUSCULAR | Status: DC | PRN
  Administered 2022-06-02 (×2): 1 mg via INTRAVENOUS

## 2022-06-02 MED ORDER — LIDOCAINE HCL (PF) 1 % IJ SOLN
INTRAMUSCULAR | Status: AC
  Filled 2022-06-02: qty 30

## 2022-06-02 MED ORDER — ONDANSETRON HCL 4 MG/2ML IJ SOLN
INTRAMUSCULAR | Status: DC | PRN
  Administered 2022-06-02: 4 mg via INTRAVENOUS

## 2022-06-02 MED ORDER — ONDANSETRON HCL 4 MG/2ML IJ SOLN
INTRAMUSCULAR | Status: AC
  Filled 2022-06-02: qty 2

## 2022-06-02 MED ORDER — CLONIDINE HCL 0.1 MG PO TABS
0.1000 mg | ORAL_TABLET | Freq: Three times a day (TID) | ORAL | Status: DC
  Administered 2022-06-02 – 2022-06-05 (×9): 0.1 mg via ORAL
  Filled 2022-06-02 (×9): qty 1

## 2022-06-02 MED ORDER — ORAL CARE MOUTH RINSE: 15.0000 mL | Freq: Once | OROMUCOSAL | Status: AC

## 2022-06-02 MED ORDER — POTASSIUM CHLORIDE 10 MEQ/100ML IV SOLN
10.0000 meq | INTRAVENOUS | Status: AC
  Administered 2022-06-02 (×2): 10 meq via INTRAVENOUS
  Filled 2022-06-02: qty 100

## 2022-06-02 MED ORDER — CHLORHEXIDINE GLUCONATE 0.12 % MT SOLN
OROMUCOSAL | Status: AC
  Administered 2022-06-02: 15 mL via OROMUCOSAL
  Filled 2022-06-02: qty 15

## 2022-06-02 MED ORDER — LIDOCAINE 2% (20 MG/ML) 5 ML SYRINGE
INTRAMUSCULAR | Status: AC
  Filled 2022-06-02: qty 5

## 2022-06-02 MED ORDER — CHLORHEXIDINE GLUCONATE 0.12 % MT SOLN: 15.0000 mL | Freq: Once | OROMUCOSAL | Status: AC

## 2022-06-02 SURGICAL SUPPLY — 42 items
BAG COUNTER SPONGE SURGICOUNT (BAG) ×2 IMPLANT
CANISTER SUCT 3000ML PPV (MISCELLANEOUS) ×2 IMPLANT
CLIP LIGATING EXTRA MED SLVR (CLIP) ×2 IMPLANT
CLIP LIGATING EXTRA SM BLUE (MISCELLANEOUS) ×2 IMPLANT
CNTNR URN SCR LID CUP LEK RST (MISCELLANEOUS) ×1 IMPLANT
CONT SPEC 4OZ STRL OR WHT (MISCELLANEOUS)
COTTONBALL LRG STERILE PKG (GAUZE/BANDAGES/DRESSINGS) ×2 IMPLANT
COVER SURGICAL LIGHT HANDLE (MISCELLANEOUS) ×2 IMPLANT
DERMABOND ADVANCED (GAUZE/BANDAGES/DRESSINGS) ×1
DERMABOND ADVANCED .7 DNX12 (GAUZE/BANDAGES/DRESSINGS) ×1 IMPLANT
DRAPE OPHTHALMIC 77X100 STRL (CUSTOM PROCEDURE TRAY) ×2 IMPLANT
ELECT NDL BLADE 2-5/6 (NEEDLE) ×1 IMPLANT
ELECT NEEDLE BLADE 2-5/6 (NEEDLE) ×2 IMPLANT
ELECT REM PT RETURN 9FT ADLT (ELECTROSURGICAL) ×2
ELECTRODE REM PT RTRN 9FT ADLT (ELECTROSURGICAL) ×1 IMPLANT
GAUZE 4X4 16PLY ~~LOC~~+RFID DBL (SPONGE) ×2 IMPLANT
GEL ULTRASOUND 8.5O AQUASONIC (MISCELLANEOUS) ×2 IMPLANT
GLOVE BIO SURGEON STRL SZ7.5 (GLOVE) ×2 IMPLANT
GOWN STRL REUS W/ TWL LRG LVL3 (GOWN DISPOSABLE) ×1 IMPLANT
GOWN STRL REUS W/ TWL XL LVL3 (GOWN DISPOSABLE) ×1 IMPLANT
GOWN STRL REUS W/TWL LRG LVL3 (GOWN DISPOSABLE) ×2
GOWN STRL REUS W/TWL XL LVL3 (GOWN DISPOSABLE) ×2
KIT BASIN OR (CUSTOM PROCEDURE TRAY) ×2 IMPLANT
KIT TURNOVER KIT B (KITS) ×2 IMPLANT
LOOP VESSEL MINI RED (MISCELLANEOUS) ×2 IMPLANT
NDL HYPO 25GX1X1/2 BEV (NEEDLE) ×1 IMPLANT
NEEDLE HYPO 25GX1X1/2 BEV (NEEDLE) ×2 IMPLANT
NS IRRIG 1000ML POUR BTL (IV SOLUTION) ×2 IMPLANT
PACK GENERAL/GYN (CUSTOM PROCEDURE TRAY) ×2 IMPLANT
PAD ARMBOARD 7.5X6 YLW CONV (MISCELLANEOUS) ×4 IMPLANT
SPIKE FLUID TRANSFER (MISCELLANEOUS) ×2 IMPLANT
SUCTION FRAZIER HANDLE 10FR (MISCELLANEOUS) ×2
SUCTION TUBE FRAZIER 10FR DISP (MISCELLANEOUS) ×1 IMPLANT
SUT MNCRL AB 4-0 PS2 18 (SUTURE) ×2 IMPLANT
SUT PROLENE 6 0 BV (SUTURE) IMPLANT
SUT SILK 3 0 (SUTURE)
SUT SILK 3-0 18XBRD TIE 12 (SUTURE) IMPLANT
SUT VIC AB 3-0 SH 27 (SUTURE)
SUT VIC AB 3-0 SH 27X BRD (SUTURE) ×1 IMPLANT
SYR CONTROL 10ML LL (SYRINGE) ×2 IMPLANT
TOWEL GREEN STERILE (TOWEL DISPOSABLE) ×2 IMPLANT
WATER STERILE IRR 1000ML POUR (IV SOLUTION) ×2 IMPLANT

## 2022-06-02 NOTE — Op Note (Signed)
    Patient name: SHARRI LOYA MRN: [id number] DOB: [birth year] Sex: female  06/02/2022 Pre-operative Diagnosis: Left temporal arteritis Post-operative diagnosis:  Same Surgeon:  Erlene Quan C. Donzetta Matters, MD Procedure Performed: Left temporal artery biopsy  Indications: 56 year old female with left monocular blindness with elevated ESR concern for temporal arteritis.  She is now indicated for biopsy.  Findings: Temporal artery was identified and removed for a total of 4 cm.   Procedure:  The patient was identified in the holding area and taken to the operating room she was placed supine on the upper table and MAC anesthesia induced.  She was sterilely prepped and draped in the left side of the face and ear timeout was called.  Doppler was used to traced the artery on the skin.  Skin was locally shaved of hair with 10 blade.  4 cm incision was then made overlying the previously marked artery.  I dissected down and identified the vein first and then the artery.  I clipped the artery distally and transected it was noted to have pulsatile bleeding.  I then dissected back left one small branch and then proximally tied off transected and passed off a 4 cm specimen.  The wound was irrigated hemostasis was obtained and I closed the skin with 4 Monocryl.  Dermabond is placed at the skin level.  She was awakened from anesthesia having tolerated procedure well without any complication.  All counts were correct at completion.  EBL: 10 cc    Luva Metzger C. Donzetta Matters, MD [facility] Office: [phone] Pager: [phone]

## 2022-06-02 NOTE — Anesthesia Preprocedure Evaluation (Signed)
Anesthesia Evaluation  Patient identified by MRN, date of birth, ID band Patient awake    Reviewed: Allergy & Precautions, NPO status , Patient's Chart, lab work & pertinent test results  History of Anesthesia Complications Negative for: history of anesthetic complications  Airway Mallampati: III  TM Distance: >3 FB Neck ROM: Full    Dental  (+) Teeth Intact, Dental Advisory Given   Pulmonary former smoker,    breath sounds clear to auscultation       Cardiovascular hypertension, Pt. on medications and Pt. on home beta blockers + angina + CAD, + Peripheral Vascular Disease and +CHF  (-) Cardiac Stents  Rhythm:Regular  1. Left ventricular ejection fraction, by estimation, is 40 to 45%. The  left ventricle has mildly decreased function. The left ventricle  demonstrates global hypokinesis. There is mild left ventricular  hypertrophy. Left ventricular diastolic parameters  are consistent with Grade II diastolic dysfunction (pseudonormalization).  2. Right ventricular systolic function is normal. The right ventricular  size is normal.  3. The pericardial effusion is circumferential.  4. The mitral valve is normal in structure. No evidence of mitral valve  regurgitation. No evidence of mitral stenosis.  5. The aortic valve is normal in structure. Aortic valve regurgitation is  trivial. No aortic stenosis is present.  6. The inferior vena cava is normal in size with greater than 50%  respiratory variability, suggesting right atrial pressure of 3 mmHg.  7. Agitated saline contrast bubble study was negative, with no evidence  of any interatrial shunt.    Colon Flattery LAD to Prox LAD lesion is 50% stenosed. .  Mid Cx lesion is 40% stenosed. .  Dist Cx lesion is 50% stenosed.  Life style modification for mild to moderate multivessel disease.    Neuro/Psych Anxiety  Neuromuscular disease CVA    GI/Hepatic Neg liver ROS, GERD  ,   Endo/Other  diabetesLab Results      Component                Value               Date                      HGBA1C                   6.8 (H)             04/22/2022             Renal/GU CRFRenal diseaseLab Results      Component                Value               Date                      CREATININE               1.28 (H)            06/02/2022                Musculoskeletal  (+) Arthritis ,   Abdominal   Peds  Hematology  (+) Blood dyscrasia, anemia , Lab Results      Component                Value               Date  WBC                      8.6                 06/02/2022                HGB                      10.5 (L)            06/02/2022                HCT                      32.6 (L)            06/02/2022                MCV                      87.2                06/02/2022                PLT                      249                 06/02/2022              Anesthesia Other Findings   Reproductive/Obstetrics                             Anesthesia Physical Anesthesia Plan  ASA: 3  Anesthesia Plan: MAC   Post-op Pain Management: Minimal or no pain anticipated   Induction: Intravenous  PONV Risk Score and Plan: 2 and Propofol infusion and Treatment may vary due to age or medical condition  Airway Management Planned: Nasal Cannula and Natural Airway  Additional Equipment:   Intra-op Plan:   Post-operative Plan:   Informed Consent: I have reviewed the patients History and Physical, chart, labs and discussed the procedure including the risks, benefits and alternatives for the proposed anesthesia with the patient or authorized representative who has indicated his/her understanding and acceptance.     Dental advisory given  Plan Discussed with: CRNA  Anesthesia Plan Comments:         Anesthesia Quick Evaluation

## 2022-06-02 NOTE — Anesthesia Procedure Notes (Signed)
Procedure Name: MAC Date/Time: 06/02/2022 12:45 PM  Performed by: Harden Mo, CRNAPre-anesthesia Checklist: Patient identified, Emergency Drugs available, Suction available and Patient being monitored Patient Re-evaluated:Patient Re-evaluated prior to induction Oxygen Delivery Method: Simple face mask Preoxygenation: Pre-oxygenation with 100% oxygen Induction Type: IV induction Placement Confirmation: breath sounds checked- equal and bilateral and positive ETCO2 Dental Injury: Teeth and Oropharynx as per pre-operative assessment

## 2022-06-02 NOTE — Progress Notes (Signed)
  Progress Note    06/02/2022 11:54 AM Day of Surgery  Subjective:  no return of vision  Vitals:   06/02/22 0723 06/02/22 1101  BP: (!) 155/105 (!) 184/101  Pulse: 98 (!) 109  Resp: 16 20  Temp: 98.2 F (36.8 C) 98.5 F (36.9 C)  SpO2: 100% 95%    Physical Exam: Aaox3 Left temple ttp with palpable pulse  CBC    Component Value Date/Time   WBC 8.6 06/02/2022 0453   RBC 3.74 (L) 06/02/2022 0453   HGB 10.5 (L) 06/02/2022 0453   HCT 32.6 (L) 06/02/2022 0453   PLT 249 06/02/2022 0453   MCV 87.2 06/02/2022 0453   MCH 28.1 06/02/2022 0453   MCHC 32.2 06/02/2022 0453   RDW 14.1 06/02/2022 0453   LYMPHSABS 3.1 05/31/2022 1410   MONOABS 0.5 05/31/2022 1410   EOSABS 0.2 05/31/2022 1410   BASOSABS 0.0 05/31/2022 1410    BMET    Component Value Date/Time   NA 138 06/02/2022 0453   K 3.4 (L) 06/02/2022 0453   CL 105 06/02/2022 0453   CO2 24 06/02/2022 0453   GLUCOSE 166 (H) 06/02/2022 0453   BUN 25 (H) 06/02/2022 0453   CREATININE 1.28 (H) 06/02/2022 0453   CALCIUM 7.4 (L) 06/02/2022 0453   GFRNONAA 49 (L) 06/02/2022 0453   GFRAA >60 06/18/2020 1139    INR    Component Value Date/Time   INR 1.2 06/01/2022 0120    No intake or output data in the 24 hours ending 06/02/22 1154   Assessment:  56 y.o. female is here with left sided blindness, concern for temporal arteritis  Plan: OR today left temporal artery biopsy   Dakari Stabler C. Donzetta Matters, MD [facility] Office: [phone] Pager: [phone]  06/02/2022 11:54 AM

## 2022-06-02 NOTE — Transfer of Care (Signed)
Immediate Anesthesia Transfer of Care Note  Patient: Erin Jackson  Procedure(s) Performed: BIOPSY TEMPORAL ARTERY (Left: Head)  Patient Location: PACU  Anesthesia Type:MAC  Level of Consciousness: awake, alert  and oriented  Airway & Oxygen Therapy: Patient Spontanous Breathing  Post-op Assessment: Report given to RN and Post -op Vital signs reviewed and stable  Post vital signs: Reviewed and stable  Last Vitals:  Vitals Value Taken Time  BP 153/64 06/02/22 1337  Temp    Pulse 115 06/02/22 1337  Resp 17 06/02/22 1337  SpO2 95 % 06/02/22 1337  Vitals shown include unvalidated device data.  Last Pain:  Vitals:   06/02/22 1116  TempSrc:   PainSc: 7       Patients Stated Pain Goal: 3 (02/54/86 2824)  Complications: No notable events documented.

## 2022-06-02 NOTE — Progress Notes (Signed)
PROGRESS NOTE    Erin Jackson  NKN:[id number] DOB: 14-Sep-1966 DOA: 05/31/2022 PCP: Merryl Hacker, No   Brief Narrative:  CHE BELOW is a 56 y.o. female with history of recent admission for stroke last month, diabetes mellitus, hypertension, chronic kidney disease stage II, chronic pain, peripheral vascular disease presented to the ER after patient had persistent loss of vision since waking up in the morning on May 30, 2022.  No other complaint.   In the ER patient had MRI brain and MRI orbits CT angiogram of head and neck.  Neurology evaluated the patient at this time patient's symptoms are concerning for temporal arteritis started on prednisone admitted for further work-up.  Sed rate is 50.  Vascular consulted, temporal artery biopsy pending.  Assessment & Plan:   Principal Problem:   Vision loss Active Problems:   T2DM (type 2 diabetes mellitus) ([place])   CAD (coronary artery disease)   Stage 3b chronic kidney disease (HCC)   Chronic systolic heart failure (HCC)   Temporal arteritis (HCC)  Sudden loss of vision left eye/possible temporal arteritis: Patient presents with sudden loss of vision, left-sided headache and has tenderness on the left temporal area with ESR over 50 and she is 56 as well.  She meets criteria for temporal arteritis.  She has been seen by neurology.  Has been started on prednisone in an effort to reverse her vision loss however her vision has not improved.  Vascular is consulted and patient is scheduled to have temporal artery biopsy today.  Recent ischemic stroke: No focal deficit.  Continue statin and holding Plavix for biopsy tomorrow.  Hypertensive urgency: Patient's systolic blood pressure has reached as high as 379 and diastolic as high as 024.  She takes only clonidine at home which has been resumed and that is only twice a day but despite of that, blood pressure remains elevated, I have now increased her clonidine to 3 times daily.  Continue as needed  hydralazine  Diabetes mellitus type 2: Last hemoglobin A1c was 6.8 just last month.  She takes metformin at home.  Holding metformin.  Continue SSI.  Slightly hyperglycemic due to being on prednisone.  Start on Lantus 10 units.  CKD stage IIIa: At baseline.  Monitor.  DVT prophylaxis: SCDs Start: 06/01/22 0515   Code Status: Full Code  Family Communication:  None present at bedside.  Plan of care discussed with patient in length and he/she verbalized understanding and agreed with it.  I also called and updated her son over the phone yesterday.  Status is: Inpatient Remains inpatient appropriate because: Pending temporal artery biopsy     Estimated body mass index is 38.74 kg/m as calculated from the following:   Height as of 05/19/22: $RemoveBef'5\' 2"'JNpZjRqchM$  (1.575 m).   Weight as of 05/19/22: 96.1 kg.    Nutritional Assessment: There is no height or weight on file to calculate BMI.. Seen by dietician.  I agree with the assessment and plan as outlined below: Nutrition Status:   Skin Assessment: I have examined the patient's skin and I agree with the wound assessment as performed by the wound care RN as outlined below:    Consultants:  Neurology Vascular surgery Procedures:  None  Antimicrobials:  Anti-infectives (From admission, onward)    Start     Dose/Rate Route Frequency Ordered Stop   06/02/22 0600  cefUROXime (ZINACEF) 1.5 g in sodium chloride 0.9 % 100 mL IVPB        1.5 g 200 mL/hr over  30 Minutes Intravenous On call to O.R. 06/01/22 1443 06/03/22 0559         Subjective:  Seen and examined.  No new complaint.  No improvement in the vision.  Complains of headache.  Per reports from staff, patient was very anxious yesterday and she called all her family members to come to the hospital as she thought she was going to have " brain biopsy".  I explained to her that it is not brain biopsy.  She understands.  Objective: Vitals:   06/02/22 0001 06/02/22 0025 06/02/22 0336 06/02/22  0723  BP: (!) 158/87  (!) 149/93 (!) 155/105  Pulse: (!) 111 100 (!) 109 98  Resp: $Remo'15  15 16  'VxVbm$ Temp: 97.7 F (36.5 C)  97.9 F (36.6 C) 98.2 F (36.8 C)  TempSrc:    Oral  SpO2: 98%  97% 100%   No intake or output data in the 24 hours ending 06/02/22 1035 There were no vitals filed for this visit.  Examination:  General exam: Appears calm and comfortable  Respiratory system: Clear to auscultation. Respiratory effort normal. Cardiovascular system: S1 & S2 heard, RRR. No JVD, murmurs, rubs, gallops or clicks. No pedal edema. Gastrointestinal system: Abdomen is nondistended, soft and nontender. No organomegaly or masses felt. Normal bowel sounds heard. Central nervous system: Alert and oriented. No focal neurological deficits.  Left temporal tenderness. Extremities: Symmetric 5 x 5 power. Skin: No rashes, lesions or ulcers.  Psychiatry: Judgement and insight appear poor  Data Reviewed: I have personally reviewed following labs and imaging studies  CBC: Recent Labs  Lab 05/29/22 1158 05/31/22 1410 06/02/22 0453  WBC 5.8 6.6 8.6  NEUTROABS 2.3 2.9  --   HGB 11.5* 10.9* 10.5*  HCT 36.8 34.6* 32.6*  MCV 91.5 92.0 87.2  PLT 280 266 154    Basic Metabolic Panel: Recent Labs  Lab 05/29/22 1158 05/31/22 1410 06/02/22 0453  NA 139 138 138  K 3.8 3.9 3.4*  CL 105 107 105  CO2 23 20* 24  GLUCOSE 184* 140* 166*  BUN 16 20 25*  CREATININE 1.24* 1.24* 1.28*  CALCIUM 7.2* 7.1* 7.4*    GFR: CrCl cannot be calculated (Unknown ideal weight.). Liver Function Tests: Recent Labs  Lab 05/29/22 1158 05/31/22 1410  AST 17 21  ALT 12 10  ALKPHOS 59 72  BILITOT 0.5 0.4  PROT 6.7 6.5  ALBUMIN 3.0* 2.9*    No results for input(s): "LIPASE", "AMYLASE" in the last 168 hours. No results for input(s): "AMMONIA" in the last 168 hours. Coagulation Profile: Recent Labs  Lab 06/01/22 0120  INR 1.2    Cardiac Enzymes: No results for input(s): "CKTOTAL", "CKMB", "CKMBINDEX",  "TROPONINI" in the last 168 hours. BNP (last 3 results) No results for input(s): "PROBNP" in the last 8760 hours. HbA1C: No results for input(s): "HGBA1C" in the last 72 hours. CBG: Recent Labs  Lab 06/01/22 0745 06/01/22 1116 06/01/22 1702 06/01/22 2117 06/02/22 0607  GLUCAP 206* 208* 249* 255* 152*    Lipid Profile: No results for input(s): "CHOL", "HDL", "LDLCALC", "TRIG", "CHOLHDL", "LDLDIRECT" in the last 72 hours. Thyroid Function Tests: No results for input(s): "TSH", "T4TOTAL", "FREET4", "T3FREE", "THYROIDAB" in the last 72 hours. Anemia Panel: No results for input(s): "VITAMINB12", "FOLATE", "FERRITIN", "TIBC", "IRON", "RETICCTPCT" in the last 72 hours. Sepsis Labs: No results for input(s): "PROCALCITON", "LATICACIDVEN" in the last 168 hours.  No results found for this or any previous visit (from the past 240 hour(s)).   Radiology  Studies: CT ANGIO HEAD NECK W WO CM  Result Date: 05/31/2022 CLINICAL DATA:  Left eye vision loss for 1 week EXAM: CT ANGIOGRAPHY HEAD AND NECK TECHNIQUE: Multidetector CT imaging of the head and neck was performed using the standard protocol during bolus administration of intravenous contrast. Multiplanar CT image reconstructions and MIPs were obtained to evaluate the vascular anatomy. Carotid stenosis measurements (when applicable) are obtained utilizing NASCET criteria, using the distal internal carotid diameter as the denominator. RADIATION DOSE REDUCTION: This exam was performed according to the departmental dose-optimization program which includes automated exposure control, adjustment of the mA and/or kV according to patient size and/or use of iterative reconstruction technique. CONTRAST:  37mL OMNIPAQUE IOHEXOL 350 MG/ML SOLN COMPARISON:  None Available. FINDINGS: CT HEAD FINDINGS Brain: There is no mass, hemorrhage or extra-axial collection. The size and configuration of the ventricles and extra-axial CSF spaces are normal. There is no acute  or chronic infarction. The brain parenchyma is normal. Skull: The visualized skull base, calvarium and extracranial soft tissues are normal. Sinuses/Orbits: No fluid levels or advanced mucosal thickening of the visualized paranasal sinuses. No mastoid or middle ear effusion. The orbits are normal. CTA NECK FINDINGS SKELETON: There is no bony spinal canal stenosis. No lytic or blastic lesion. OTHER NECK: Normal pharynx, larynx and major salivary glands. No cervical lymphadenopathy. Unremarkable thyroid gland. UPPER CHEST: No pneumothorax or pleural effusion. No nodules or masses. AORTIC ARCH: There is calcific atherosclerosis of the aortic arch. There is no aneurysm, dissection or hemodynamically significant stenosis of the visualized portion of the aorta. Conventional 3 vessel aortic branching pattern. The visualized proximal subclavian arteries are widely patent. RIGHT CAROTID SYSTEM: Normal without aneurysm, dissection or stenosis. LEFT CAROTID SYSTEM: No dissection, occlusion or aneurysm. Mild atherosclerotic calcification at the carotid bifurcation without hemodynamically significant stenosis. There is soft plaque narrowing the distal left ICA causing less than 50% stenosis. VERTEBRAL ARTERIES: Left dominant configuration. Both origins are clearly patent. There is no dissection, occlusion or flow-limiting stenosis to the skull base (V1-V3 segments). CTA HEAD FINDINGS POSTERIOR CIRCULATION: --Vertebral arteries: Normal V4 segments. --Inferior cerebellar arteries: Normal. --Basilar artery: Normal. --Superior cerebellar arteries: Normal. --Posterior cerebral arteries (PCA): Normal. ANTERIOR CIRCULATION: --Intracranial internal carotid arteries: Normal. --Anterior cerebral arteries (ACA): Normal. Both A1 segments are present. Patent anterior communicating artery (a-comm). --Middle cerebral arteries (MCA): Normal. VENOUS SINUSES: As permitted by contrast timing, patent. ANATOMIC VARIANTS: None Review of the MIP  images confirms the above findings. IMPRESSION: 1. No emergent large vessel occlusion or hemodynamically significant stenosis of the head or neck. 2. Soft plaque narrowing the distal left ICA causing less than 50% stenosis. 3. Aortic Atherosclerosis (ICD10-I70.0). Electronically Signed   By: Ulyses Jarred M.D.   On: 05/31/2022 23:34   MR BRAIN WO CONTRAST  Result Date: 05/31/2022 CLINICAL DATA:  Monocular vision loss EXAM: MRI HEAD AND ORBITS WITHOUT CONTRAST TECHNIQUE: Multiplanar, multi-echo pulse sequences of the brain and surrounding structures were acquired without intravenous contrast. Multiplanar, multi-echo pulse sequences of the orbits and surrounding structures were acquired including fat saturation techniques, without intravenous contrast administration. COMPARISON:  None Available. FINDINGS: MRI HEAD FINDINGS Brain: No acute infarct, mass effect or extra-axial collection. No acute or chronic hemorrhage. There is multifocal hyperintense T2-weighted signal within the white matter. Parenchymal volume and CSF spaces are normal. There are old right pontine and left basal ganglia small vessel infarct. The midline structures are normal. Vascular: Major flow voids are preserved. Skull and upper cervical spine: Normal calvarium and skull  base. Visualized upper cervical spine and soft tissues are normal. Sinuses/Orbits:No paranasal sinus fluid levels or advanced mucosal thickening. No mastoid or middle ear effusion. Normal orbits. MRI ORBITS FINDINGS Orbits: The globes are intact. Optic nerves are normal. Normal extraocular muscles and lacrimal glands. The bony orbit, preseptal soft tissues and the intra- and extraconal orbital fat are normal. Visualized sinuses:  No fluid levels or advanced mucosal thickening. Soft tissues: Normal. IMPRESSION: 1. Mild chronic small vessel disease with old right pontine and left basal ganglia small vessel infarcts. 2. Normal orbits. Electronically Signed   By: Ulyses Jarred  M.D.   On: 05/31/2022 23:03   MR ORBITS WO CONTRAST  Result Date: 05/31/2022 CLINICAL DATA:  Monocular vision loss EXAM: MRI HEAD AND ORBITS WITHOUT CONTRAST TECHNIQUE: Multiplanar, multi-echo pulse sequences of the brain and surrounding structures were acquired without intravenous contrast. Multiplanar, multi-echo pulse sequences of the orbits and surrounding structures were acquired including fat saturation techniques, without intravenous contrast administration. COMPARISON:  None Available. FINDINGS: MRI HEAD FINDINGS Brain: No acute infarct, mass effect or extra-axial collection. No acute or chronic hemorrhage. There is multifocal hyperintense T2-weighted signal within the white matter. Parenchymal volume and CSF spaces are normal. There are old right pontine and left basal ganglia small vessel infarct. The midline structures are normal. Vascular: Major flow voids are preserved. Skull and upper cervical spine: Normal calvarium and skull base. Visualized upper cervical spine and soft tissues are normal. Sinuses/Orbits:No paranasal sinus fluid levels or advanced mucosal thickening. No mastoid or middle ear effusion. Normal orbits. MRI ORBITS FINDINGS Orbits: The globes are intact. Optic nerves are normal. Normal extraocular muscles and lacrimal glands. The bony orbit, preseptal soft tissues and the intra- and extraconal orbital fat are normal. Visualized sinuses:  No fluid levels or advanced mucosal thickening. Soft tissues: Normal. IMPRESSION: 1. Mild chronic small vessel disease with old right pontine and left basal ganglia small vessel infarcts. 2. Normal orbits. Electronically Signed   By: Ulyses Jarred M.D.   On: 05/31/2022 23:03    Scheduled Meds:  allopurinol  100 mg Oral Daily   atorvastatin  80 mg Oral Daily   cloNIDine  0.1 mg Oral TID   hydrOXYzine  25 mg Oral TID   insulin aspart  0-9 Units Subcutaneous TID WC   predniSONE  60 mg Oral Q breakfast   traZODone  150 mg Oral QHS   Continuous  Infusions:  cefUROXime (ZINACEF)  IV     potassium chloride 10 mEq (06/02/22 0902)     LOS: 1 day   Darliss Cheney, MD [facility]  06/02/2022, 10:35 AM   *Please note that this is a verbal dictation therefore any spelling or grammatical errors are due to the "[place] [employer]" system interpretation.  Please page via [place] and do not message via secure chat for urgent patient care matters. Secure chat can be used for non urgent patient care matters.  How to contact the [facility] Attending or Consulting provider [place] or covering provider during after hours [place], for this patient?  Check the care team in [facility] and look for a) attending/consulting [facility] provider listed and b) the [facility] team listed. Page or secure chat [phone]. Log into [email or web address] and use [place]'s universal password to access. If you do not have the password, please contact the hospital operator. Locate the [facility] provider you are looking for under [facility] and page to a number that you can be directly reached. If you still have  difficulty reaching the provider, please page the [facility] (Director on Call) for the Hospitalists listed on amion for assistance.

## 2022-06-02 NOTE — Progress Notes (Signed)
Neurology Progress Note  Brief HPI: Erin Jackson is a 56 y.o. African American female with PMH of hypertension, diabetes, CAD, cardiomyopathy, PVD, hyperlipidemia, smoker, obesity, stroke in 04/2022 presented to ED for left vision loss starting Saturday. Symptoms initially started Monday with dizziness and lightheadedness and then Friday she developed a left temporal headache, scalp tenderness, and pain extending to the left orbital area.   Subjective: Laying in bed, she does still report a headache and left eye vision loss. Oxycodone ordered for headache, tylenol was not sufficient in relieving her pain. Steroids started yesterday.   Exam: Vitals:   06/02/22 0336 06/02/22 0723  BP: (!) 149/93 (!) 155/105  Pulse: (!) 109 98  Resp: 15 16  Temp: 97.9 F (36.6 C) 98.2 F (36.8 C)  SpO2: 97% 100%   Gen: In bed, NAD Resp: non-labored breathing, no acute distress Abd: soft, nt  Neuro: Mental Status: Aox4, naming, comprehension, repetition intact.  Cranial Nerves: total blindness of OS, left pupil sluggish, pain with palpation of left temporal area.  Motor: tone and bulk normal, 5/5 strength in all extremities Sensory: light touch sensation intact Gait: Deferred  Pertinent Labs: CRP < 0.5 ESR 50  Imaging Reviewed: MRI brain and orbital no acute finding.   CT head and neck no LVO, soft plaque narrowing distal left ICA but less than 50% stenosis.   Assessment: 56 y.o. female with PMH of hypertension, diabetes, CAD, cardiomyopathy, PVD, hyperlipidemia, smoker, obesity, stroke in 04/2022 presented to ED for left vision loss, also has left temporal headache, scalp tenderness on touch, pain also extending to left orbital area as well as left vision loss. Denies fever, shoulder pain or jaw claudication.      Impression: Etiology for patient's symptoms concerning for temporal arteritis on the left.   Recommendations: - VVS consult for temporal artery biopsy, plan for today -Empiric  treatment of prednisone -Pending ESR and CRP -Continue other home meds   Patient seen and examined by NP/APP with MD. MD to update note as needed.    [name], DNP, FNP-BC [facility] Pager: [phone]

## 2022-06-03 ENCOUNTER — Encounter: Payer: Medicare Other | Admitting: Physical Medicine and Rehabilitation

## 2022-06-03 ENCOUNTER — Encounter (HOSPITAL_COMMUNITY): Payer: Self-pay | Admitting: Vascular Surgery

## 2022-06-03 DIAGNOSIS — F4321 Adjustment disorder with depressed mood: Secondary | ICD-10-CM | POA: Diagnosis not present

## 2022-06-03 DIAGNOSIS — M316 Other giant cell arteritis: Secondary | ICD-10-CM | POA: Diagnosis not present

## 2022-06-03 DIAGNOSIS — H5460 Unqualified visual loss, one eye, unspecified: Secondary | ICD-10-CM | POA: Diagnosis not present

## 2022-06-03 DIAGNOSIS — F419 Anxiety disorder, unspecified: Secondary | ICD-10-CM

## 2022-06-03 DIAGNOSIS — H547 Unspecified visual loss: Secondary | ICD-10-CM | POA: Diagnosis not present

## 2022-06-03 LAB — GLUCOSE, CAPILLARY
Glucose-Capillary: 142 mg/dL — ABNORMAL HIGH (ref 70–99)
Glucose-Capillary: 230 mg/dL — ABNORMAL HIGH (ref 70–99)
Glucose-Capillary: 482 mg/dL — ABNORMAL HIGH (ref 70–99)
Glucose-Capillary: 316 mg/dL — ABNORMAL HIGH (ref 70–99)

## 2022-06-03 MED ORDER — CLOPIDOGREL BISULFATE 75 MG PO TABS
75.0000 mg | ORAL_TABLET | Freq: Every day | ORAL | Status: DC
  Administered 2022-06-03 – 2022-06-08 (×6): 75 mg via ORAL
  Filled 2022-06-03 (×6): qty 1

## 2022-06-03 MED ORDER — ENOXAPARIN SODIUM 40 MG/0.4ML IJ SOSY
40.0000 mg | PREFILLED_SYRINGE | INTRAMUSCULAR | Status: DC
Start: 2022-06-03 — End: 2022-06-08
  Administered 2022-06-03 – 2022-06-07 (×5): 40 mg via SUBCUTANEOUS
  Filled 2022-06-03 (×5): qty 0.4

## 2022-06-03 MED ORDER — INSULIN ASPART 100 UNIT/ML IJ SOLN
12.0000 [IU] | Freq: Once | INTRAMUSCULAR | Status: AC
  Administered 2022-06-03: 12 [IU] via SUBCUTANEOUS

## 2022-06-03 MED ORDER — METFORMIN HCL 500 MG PO TABS
500.0000 mg | ORAL_TABLET | Freq: Two times a day (BID) | ORAL | Status: DC
Start: 2022-06-03 — End: 2022-06-04
  Administered 2022-06-03 – 2022-06-04 (×2): 500 mg via ORAL
  Filled 2022-06-03 (×2): qty 1

## 2022-06-03 MED ORDER — ALPRAZOLAM 0.5 MG PO TABS
2.0000 mg | ORAL_TABLET | Freq: Three times a day (TID) | ORAL | Status: DC | PRN
Start: 2022-06-03 — End: 2022-06-08
  Administered 2022-06-03 – 2022-06-08 (×8): 2 mg via ORAL
  Filled 2022-06-03 (×8): qty 4

## 2022-06-03 MED ORDER — LOSARTAN POTASSIUM 25 MG PO TABS
25.0000 mg | ORAL_TABLET | Freq: Every day | ORAL | Status: DC
  Administered 2022-06-03 – 2022-06-05 (×3): 25 mg via ORAL
  Filled 2022-06-03 (×3): qty 1

## 2022-06-03 MED ORDER — ESCITALOPRAM OXALATE 10 MG PO TABS
5.0000 mg | ORAL_TABLET | Freq: Every day | ORAL | Status: DC
  Administered 2022-06-03 – 2022-06-07 (×5): 5 mg via ORAL
  Filled 2022-06-03 (×5): qty 1

## 2022-06-03 MED ORDER — INSULIN ASPART 100 UNIT/ML IJ SOLN
0.0000 [IU] | Freq: Every day | INTRAMUSCULAR | Status: DC
  Administered 2022-06-03: 4 [IU] via SUBCUTANEOUS

## 2022-06-03 NOTE — Consult Note (Signed)
Physical Medicine and Rehabilitation Consult Reason for Consult: Impaired mobility and ADLs due to temporal arteritis Referring Physician: [name]   HPI: Erin Jackson is a 56 y.o. female who was recently in inpatient rehab for CVA. She messaged our rehabilitation clinic last Thursday regarding lightheadedness. I called her last Friday to discuss her symptoms and she noted new-onset vision loss that started that morning. I advised her to call an 911 to go to the ED as acute monolocular vision loss could be a sign of another stroke. She went to the ED and was told her symptoms were non-emergent. She decided to leave the ED. Home therapy came to work with her on Sunday and she continued to have monocular vision loss and her therapist encouraged her to go back to the ED. She returned to the ED and was told she may have temporal arteritis. She underwent a left temporal artery biopsy yesterday and continues to have complete vision loss in her left eye and blurry vision in her right eye. She is very depressed by her current vision loss and would like to return to CIR for rehabilitation following her new deficits.    ROS+ left sided complete vision loss, blurry vision in right eye, +depression regarding her blindness, +worsening chronic anxiety, +impaired mobility, +hunger  Past Medical History:  Diagnosis Date   Anxiety    Chronic back pain    Diabetes mellitus without complication ([place])    GERD (gastroesophageal reflux disease)    Hypertension    Past Surgical History:  Procedure Laterality Date   ABDOMINAL HYSTERECTOMY     ARTERY BIOPSY Left 06/02/2022   Procedure: BIOPSY TEMPORAL ARTERY;  Surgeon: Waynetta Sandy, MD;  Location: [facility];  Service: Vascular;  Laterality: Left;   BREAST LUMPECTOMY Left 03/25/2022   Procedure: LEFT BREAST LUMPECTOMY;  Surgeon: Jovita Kussmaul, MD;  Location: [facility];  Service: General;  Laterality: Left;   BREAST REDUCTION  SURGERY Bilateral 08/15/2018   Procedure: BILATERAL MAMMARY REDUCTION  (BREAST);  Surgeon: Cristine Polio, MD;  Location: [facility];  Service: Plastics;  Laterality: Bilateral;   BREAST REDUCTION SURGERY Left 09/25/2019   Procedure: EXCISION OF FAT NECROSIS OF LEFT BREAST;  Surgeon: Cristine Polio, MD;  Location: [facility];  Service: Plastics;  Laterality: Left;   CARPAL TUNNEL RELEASE Right 06/17/2020   right thumb CMC arthroplasty with tendon transfer and right carpal tunnel release.    CERVICAL SPINE SURGERY  2015   has a metal plate   FOOT SURGERY Left 2014   HAND SURGERY     LEFT HEART CATH AND CORONARY ANGIOGRAPHY N/A 04/09/2022   Procedure: LEFT HEART CATH AND CORONARY ANGIOGRAPHY;  Surgeon: Dixie Dials, MD;  Location: [facility];  Service: Cardiovascular;  Laterality: N/A;   REDUCTION MAMMAPLASTY Bilateral 07/2018   Family History  Problem Relation Age of Onset   Breast cancer Maternal Grandmother    Breast cancer Maternal Aunt    Social History:  reports that she quit smoking about 6 weeks ago. Her smoking use included cigarettes. She smoked an average of .5 packs per day. She has never used smokeless tobacco. She reports current alcohol use. She reports that she does not currently use drugs after having used the following drugs: Marijuana. Allergies:  Allergies  Allergen Reactions   Cucumber Extract Anaphylaxis   Molds & Smuts Shortness Of Breath   Norvasc [Amlodipine] Other (See Comments)    Muscle  became very tight   Other Anaphylaxis and Other (See Comments)    Tree nuts, walnuts, peanuts, pickles   Apple Juice Hives   Aspirin Rash   Ibuprofen Rash   Niacin And Related Rash   Facility-Administered Medications Prior to Admission  Medication Dose Route Frequency Provider Last Rate Last Admin   lidocaine (LIDODERM) 5 % 1 patch  1 patch Transdermal Q24H Jennye Boroughs, MD       Medications Prior to Admission  Medication  Sig Dispense Refill   acetaminophen (TYLENOL) 325 MG tablet Take 2 tablets (650 mg total) by mouth every 4 (four) hours as needed for fever or mild pain.     allopurinol (ZYLOPRIM) 100 MG tablet Take 100 mg by mouth daily.     alprazolam (XANAX) 2 MG tablet Take 0.5 tablets (1 mg total) by mouth 2 (two) times daily as needed for anxiety. 20 tablet 0   atorvastatin (LIPITOR) 80 MG tablet Take 1 tablet (80 mg total) by mouth daily. 30 tablet 0   cetirizine (ZYRTEC ALLERGY) 10 MG tablet Take 1 tablet (10 mg total) by mouth daily. (Patient taking differently: Take 10 mg by mouth daily as needed for allergies.) 30 tablet 1   cloNIDine (CATAPRES) 0.1 MG tablet Take 1 tablet (0.1 mg total) by mouth 2 (two) times daily. 60 tablet 0   clopidogrel (PLAVIX) 75 MG tablet Take 1 tablet (75 mg total) by mouth daily. 30 tablet 0   EPINEPHRINE 0.3 mg/0.3 mL IJ SOAJ injection Inject 0.3 mLs (0.3 mg total) into the muscle once as needed (allergic reaction). (Patient taking differently: Inject 0.3 mg into the muscle as needed for anaphylaxis (allergic reaction).) 1 Device 1   HYDROcodone-acetaminophen (NORCO) 10-325 MG tablet Take 1 tablet by mouth 3 (three) times daily as needed for moderate pain.     hydrOXYzine (ATARAX) 25 MG tablet Take 25 mg by mouth 3 (three) times daily.     meclizine (ANTIVERT) 12.5 MG tablet Take 1 tablet (12.5 mg total) by mouth 2 (two) times daily as needed for dizziness. 30 tablet 0   metFORMIN (GLUCOPHAGE) 500 MG tablet Take 1 tablet (500 mg total) by mouth 2 (two) times daily with a meal. 60 tablet 0   methocarbamol (ROBAXIN) 500 MG tablet Take 1 tablet (500 mg total) by mouth every 6 (six) hours as needed for muscle spasms. 30 tablet 0   NARCAN 4 MG/0.1ML LIQD nasal spray kit Place 0.4 mg into the nose as needed (opoid overdose).     nitroGLYCERIN (NITROSTAT) 0.4 MG SL tablet Place 1 tablet (0.4 mg total) under the tongue every 5 (five) minutes as needed for chest pain. 25 tablet 0    omeprazole (PRILOSEC) 40 MG capsule Take 40 mg by mouth 2 (two) times daily.     prochlorperazine (COMPAZINE) 5 MG tablet Take 1-2 tablets (5-10 mg total) by mouth every 6 (six) hours as needed for nausea. 30 tablet 0   Vitamin D, Ergocalciferol, (DRISDOL) 1.25 MG (50000 UNIT) CAPS capsule Take 1 capsule (50,000 Units total) by mouth every 7 (seven) days. (Patient taking differently: Take 50,000 Units by mouth every Monday.) 5 capsule    HYDROcodone-acetaminophen (NORCO/VICODIN) 5-325 MG tablet Take 2 tablets by mouth 3 (three) times daily as needed for severe pain. (Patient not taking: Reported on 06/01/2022) 30 tablet 0   Lancets (ONETOUCH DELICA PLUS [id number]) MISC Apply topically daily.     metoprolol tartrate (LOPRESSOR) 100 MG tablet Take 1 tablet (100 mg total) by mouth  2 (two) times daily. (Patient not taking: Reported on 05/19/2022) 60 tablet 0   nicotine (NICODERM CQ - DOSED IN MG/24 HOURS) 14 mg/24hr patch Place 1 patch (14 mg total) onto the skin daily. (Patient not taking: Reported on 05/19/2022) 28 patch 0   ONETOUCH VERIO test strip daily.     traZODone (DESYREL) 150 MG tablet Take 1 tablet (150 mg total) by mouth at bedtime. 30 tablet 0    Home:    Functional History:   Functional Status:  Mobility:          ADL:    Cognition: Cognition Orientation Level: Oriented X4    Blood pressure (!) 157/99, pulse (!) 102, temperature 98.1 F (36.7 C), temperature source Oral, resp. rate 14, height '5\' 2"'  (1.575 m), weight 90.7 kg, SpO2 97 %. Physical Exam Gen: no distress, normal appearing HEENT: sluggish left pupil, left eye nonreactive to movement Cardio: Tachycardic Chest: normal effort, normal rate of breathing Abd: soft, non-distended Ext: no edema Psych: pleasant, normal affect Skin: intact Neuro/MSK: 5/5 strength throughout, sensation is intact   Results for orders placed or performed during the hospital encounter of 05/31/22 (from the past 24 hour(s))  Glucose,  capillary     Status: Abnormal   Collection Time: 06/02/22  4:55 PM  Result Value Ref Range   Glucose-Capillary 345 (H) 70 - 99 mg/dL   Comment 1 Notify RN   Glucose, capillary     Status: Abnormal   Collection Time: 06/02/22 10:13 PM  Result Value Ref Range   Glucose-Capillary 164 (H) 70 - 99 mg/dL   Comment 1 Notify RN    Comment 2 Document in Chart   Glucose, capillary     Status: Abnormal   Collection Time: 06/03/22  6:09 AM  Result Value Ref Range   Glucose-Capillary 142 (H) 70 - 99 mg/dL   Comment 1 Notify RN    Comment 2 Document in Chart   Glucose, capillary     Status: Abnormal   Collection Time: 06/03/22 11:28 AM  Result Value Ref Range   Glucose-Capillary 230 (H) 70 - 99 mg/dL   No results found.  A/P: 1) Left eye blindness, right eye blurry vision: -discussed deficits and prognosis with patient -discussed with Dr. Rowe Pavy ordering PT and OT for impaired mobility given her new visual deficits. -discussed with patient that she may benefit from CIR; will assess her progress with therapy.  2) Anxiety: -patient requested that her home dose of Xanax 26m TID prn be ordered, discussed with Dr. ARowe Pavyand I have ordered this for her  3) Type 2 DM -patient requests that her metformin me restarted -Cr reviewed and stable. Discussed request with Dr. ARowe Pavyand metformin has been restarted.  KLeeroy Cha MD

## 2022-06-03 NOTE — Progress Notes (Signed)
Brief Nutrition Note  RD team not following pt, but RN reports that pt has been inquiring about snack bags. States that pt is stating she is hungry and meal trays do not have much food on them. Liberalized diet to carb modified to allow for more dining choices and will order a low CHO snack before bed from dining services.   Greig Castilla, RD, LDN Clinical Dietitian RD pager # available in AMION  After hours/weekend pager # available in [facility]

## 2022-06-03 NOTE — Progress Notes (Signed)
  Progress Note   Date: 06/03/2022  Patient Name: Erin Jackson        MRN#: [id number]  Potassium was given for hypokalemia to maximize to 4.0

## 2022-06-03 NOTE — Progress Notes (Signed)
PROGRESS NOTE    Erin Jackson  BJS:[id number] DOB: September 03, 1966 DOA: 05/31/2022 PCP: Merryl Hacker, No   Brief Narrative:  Erin Jackson is a 56 y.o. female with history of recent admission for stroke last month, diabetes mellitus, hypertension, chronic kidney disease stage II, chronic pain, peripheral vascular disease presented to the ER after patient had persistent loss of vision since waking up in the morning on May 30, 2022.  No other complaint.   In the ER patient had MRI brain and MRI orbits CT angiogram of head and neck.  Neurology evaluated the patient at this time patient's symptoms are concerning for temporal arteritis started on prednisone admitted for further work-up.  ESR elevated.  Vascular consulted, temporal artery biopsy done yesterday  Assessment & Plan:   Principal Problem:   Vision loss Active Problems:   T2DM (type 2 diabetes mellitus) ([place])   CAD (coronary artery disease)   Stage 3b chronic kidney disease (HCC)   Chronic systolic heart failure (H[facility])   Temporal arteritis (H[facility])  Sudden loss of vision left eye/possible temporal arteritis: Patient presents with sudden loss of vision, left-sided headache and has tenderness on the left temporal area with ESR over 50 and she is 56 as well.  She meets criteria for temporal arteritis.  She has been seen by neurology.  Has been started on prednisone in an effort to reverse her vision loss however her vision has not improved. Likely unrecoverable. Vascular was consulted and patient had temporal artery biopsy yesterday. CIR evaluating for rehab.  Recent ischemic stroke: No focal deficit.  Continue statin and resumed Plavix. Vascular okayed to resume  Hypertensive urgency: uncontrolled. Clonidine home dose increased. Started losartan.   Anxiety: resumed home Xanax. Started low dose SSRI. Feels depressed as would be expected with new onset vision loss  Diabetes mellitus type 2: Last hemoglobin A1c was 6.8 just last month.  She takes  metformin at home.  Started home metformin  Continue SSI.  Slightly hyperglycemic due to being on prednisone.  Start on Lantus 10 units.  CKD stage IIIA: At baseline.  Monitor.  DVT prophylaxis: SCDs   Code Status: Full Code  Family Communication: Sister present at bedside  Status is: Inpatient Remains inpatient appropriate because: PT OT eval     Estimated body mass index is 36.58 kg/m as calculated from the following:   Height as of this encounter: _0  (1.575 m).   Weight as of this encounter: 90.7 kg.    Nutritional Assessment: Body mass index is 36.58 kg/m.Marland Kitchen Seen by dietician.  I agree with the assessment and plan as outlined below: Nutrition Status:   Skin Assessment: I have examined the patient's skin and I agree with the wound assessment as performed by the wound care RN as outlined below:    Consultants:  Neurology Vascular surgery Procedures:  None  Antimicrobials:  Anti-infectives (From admission, onward)    Start     Dose/Rate Route Frequency Ordered Stop   06/02/22 0600  cefUROXime (ZINACEF) 1.5 g in sodium chloride 0.9 % 100 mL IVPB        1.5 g 200 mL/hr over 30 Minutes Intravenous On call to O.R. 06/01/22 6073 06/02/22 1300         Subjective:  Seen and examined.  No new complaint.  No improvement in the vision.  Feeling depressed but denies SI.  Objective: Vitals:   06/03/22 0333 06/03/22 0750 06/03/22 0800 06/03/22 1131  BP: (!) 154/77 (!) 155/95 (!) 155/65 (!) 157/99  Pulse: 92  100 (!) 102  Resp: _0 Temp: 98.3 F (36.8 C)  98.2 F (36.8 C) 98.1 F (36.7 C)  TempSrc:   Oral Oral  SpO2: 99%  100% 97%  Weight:      Height:        Intake/Output Summary (Last 24 hours) at 06/03/2022 1515 Last data filed at 06/03/2022 0800 Gross per 24 hour  Intake 600 ml  Output --  Net 600 ml   Filed Weights   06/02/22 1101  Weight: 90.7 kg    Examination:  General exam: Appears calm and comfortable  Respiratory system: Clear to  auscultation. Respiratory effort normal. Cardiovascular system: S1 & S2 heard, RRR. No JVD, murmurs, rubs, gallops or clicks. No pedal edema. Gastrointestinal system: Abdomen is nondistended, soft and nontender. No organomegaly or masses felt. Normal bowel sounds heard. Central nervous system: Alert and oriented. No focal neurological deficits.  Left temporal tenderness. Extremities: Symmetric 5 x 5 power. Skin: No rashes, lesions or ulcers.  Psychiatry: Judgement and insight appear poor  Data Reviewed: I have personally reviewed following labs and imaging studies  CBC: Recent Labs  Lab 05/29/22 1158 05/31/22 1410 06/02/22 0453  WBC 5.8 6.6 8.6  NEUTROABS 2.3 2.9  --   HGB 11.5* 10.9* 10.5*  HCT 36.8 34.6* 32.6*  MCV 91.5 92.0 87.2  PLT 280 266 601    Basic Metabolic Panel: Recent Labs  Lab 05/29/22 1158 05/31/22 1410 06/02/22 0453  NA 139 138 138  K 3.8 3.9 3.4*  CL 105 107 105  CO2 23 20* 24  GLUCOSE 184* 140* 166*  BUN 16 20 25*  CREATININE 1.24* 1.24* 1.28*  CALCIUM 7.2* 7.1* 7.4*    GFR: Estimated Creatinine Clearance: 50.1 mL/min (A) (by C-G formula based on SCr of 1.28 mg/dL (H)). Liver Function Tests: Recent Labs  Lab 05/29/22 1158 05/31/22 1410  AST 17 21  ALT 12 10  ALKPHOS 59 72  BILITOT 0.5 0.4  PROT 6.7 6.5  ALBUMIN 3.0* 2.9*    No results for input(s): "LIPASE", "AMYLASE" in the last 168 hours. No results for input(s): "AMMONIA" in the last 168 hours. Coagulation Profile: Recent Labs  Lab 06/01/22 0120  INR 1.2    Cardiac Enzymes: No results for input(s): "CKTOTAL", "CKMB", "CKMBINDEX", "TROPONINI" in the last 168 hours. BNP (last 3 results) No results for input(s): "PROBNP" in the last 8760 hours. HbA1C: No results for input(s): "HGBA1C" in the last 72 hours. CBG: Recent Labs  Lab 06/02/22 1353 06/02/22 1655 06/02/22 2213 06/03/22 0609 06/03/22 1128  GLUCAP 105* 345* 164* 142* 230*    Lipid Profile: No results for  input(s): "CHOL", "HDL", "LDLCALC", "TRIG", "CHOLHDL", "LDLDIRECT" in the last 72 hours. Thyroid Function Tests: No results for input(s): "TSH", "T4TOTAL", "FREET4", "T3FREE", "THYROIDAB" in the last 72 hours. Anemia Panel: No results for input(s): "VITAMINB12", "FOLATE", "FERRITIN", "TIBC", "IRON", "RETICCTPCT" in the last 72 hours. Sepsis Labs: No results for input(s): "PROCALCITON", "LATICACIDVEN" in the last 168 hours.  No results found for this or any previous visit (from the past 240 hour(s)).   Radiology Studies: No results found.  Scheduled Meds:  allopurinol  100 mg Oral Jackson   atorvastatin  80 mg Oral Jackson   cloNIDine  0.1 mg Oral TID   clopidogrel  75 mg Oral Jackson   escitalopram  5 mg Oral Jackson   hydrOXYzine  25 mg Oral TID   insulin aspart  0-9 Units Subcutaneous TID WC  insulin glargine-yfgn  10 Units Subcutaneous Jackson   losartan  25 mg Oral Jackson   metFORMIN  500 mg Oral BID WC   predniSONE  60 mg Oral Q breakfast   traZODone  150 mg Oral QHS       LOS: 2 days   Leslee Home, MD [facility]  06/03/2022, 3:15 PM   *Please note that this is a verbal dictation therefore any spelling or grammatical errors are due to the "[place] [employer]" system interpretation.  Please page via [place] and do not message via secure chat for urgent patient care matters. Secure chat can be used for non urgent patient care matters.  How to contact the [facility] Attending or Consulting provider [place] or covering provider during after hours [place], for this patient?  Check the care team in [facility] and look for a) attending/consulting [facility] provider listed and b) the [facility] team listed. Page or secure chat [phone]. Log into [email or web address] and use [place]'s universal password to access. If you do not have the password, please contact the hospital operator. Locate the [facility] provider you are looking for under [facility] and page to a number that you can be directly reached. If  you still have difficulty reaching the provider, please page the [facility] (Director on Call) for the Hospitalists listed on amion for assistance.

## 2022-06-03 NOTE — Anesthesia Postprocedure Evaluation (Signed)
Anesthesia Post Note  Patient: Erin Jackson  Procedure(s) Performed: BIOPSY TEMPORAL ARTERY (Left: Head)     Patient location during evaluation: PACU Anesthesia Type: MAC Level of consciousness: awake and alert Pain management: pain level controlled Vital Signs Assessment: post-procedure vital signs reviewed and stable Respiratory status: spontaneous breathing, nonlabored ventilation, respiratory function stable and patient connected to nasal cannula oxygen Cardiovascular status: stable and blood pressure returned to baseline Postop Assessment: no apparent nausea or vomiting Anesthetic complications: no   No notable events documented.  Last Vitals:  Vitals:   06/03/22 0800 06/03/22 1131  BP: (!) 155/65 (!) 157/99  Pulse: 100 (!) 102  Resp: 18 14  Temp: 36.8 C 36.7 C  SpO2: 100% 97%    Last Pain:  Vitals:   06/03/22 1131  TempSrc: Oral  PainSc:                  Tersa Fotopoulos

## 2022-06-03 NOTE — Evaluation (Signed)
Physical Therapy Evaluation Patient Details Name: Erin Jackson MRN: [id number] DOB: 11/30/1966 Today's Date: 06/03/2022  History of Present Illness  Patient is a 56 year old female with with left sided blindness, concern for temporal arteritis. s/p left temporal artery biopsy on 06/02/22. Past medical history significant for  hypertension, diabetes, CAD, cardiomyopathy, PVD, hyperlipidemia, smoker, obesity, stroke in 04/2022   Clinical Impression  Patient is agreeable to PT. She reports feeling depressed after having a stroke earlier this year and now vision loss in left eye. She is Mod I with ambulation using rolling walker at baseline.  Vision impairments impact functional independence and safety during mobility today. Min guard assistance provided for safety with hallway ambulation with moderate verbal cues for scanning environment and avoiding obstacles on the left. Patient is fatigued with prolonged ambulation and reports mild dizziness towards the end of the walking bouts. Heart rate 105bpm and Sp02 100% on room air. The patient is not at her baseline level of functional mobility and feels she will be unable to manage at home in her current state. The patient is requesting inpatient rehab at discharge as she was there earlier this year following stroke. Recommend to continue PT to maximize independence and facilitate return to prior level of function.      Recommendations for follow up therapy are one component of a multi-disciplinary discharge planning process, led by the attending physician.  Recommendations may be updated based on patient status, additional functional criteria and insurance authorization.  Follow Up Recommendations Acute inpatient rehab (3hours/day)    Assistance Recommended at Discharge Intermittent Supervision/Assistance  Patient can return home with the following  A little help with walking and/or transfers;A little help with bathing/dressing/bathroom;Help with stairs  or ramp for entrance;Assist for transportation;Assistance with cooking/housework    Equipment Recommendations Rollator (4 wheels) (patient recommending rollator for impaired endurance)  Recommendations for Other Services       Functional Status Assessment Patient has had a recent decline in their functional status and demonstrates the ability to make significant improvements in function in a reasonable and predictable amount of time.     Precautions / Restrictions Precautions Precautions: Fall Restrictions Weight Bearing Restrictions: No      Mobility  Bed Mobility               General bed mobility comments: not assessed as patient sitting up on arrival and post session    Transfers Overall transfer level: Needs assistance Equipment used: Rolling walker (2 wheels) Transfers: Sit to/from Stand Sit to Stand: Supervision           General transfer comment: occasional cues for safety with transfers. multiple transfers performed from various surfaces including recliner chair, toilet, and bed.    Ambulation/Gait Ambulation/Gait assistance: Min guard Gait Distance (Feet): 75 Feet Assistive device: Rolling walker (2 wheels) Gait Pattern/deviations: Decreased stride length Gait velocity: decreased     General Gait Details: patient ambulated around room and in hallway with rolling walker. she required moderate verbal cues to avoid obstacles on the left and turning head to scan environment for safety due to impaired vision. she does report mild dizziness with ambulation with Sp02 100% on room air and heart rate 105bpm. endurance imparied for sustained activity in standing  Stairs            Wheelchair Mobility    Modified Rankin (Stroke Patients Only)       Balance Overall balance assessment: Needs assistance Sitting-balance support: Feet supported Sitting balance-Leahy Scale:  Good Sitting balance - Comments: weight shifting and reaching outside base of  support during peri-hygiene after urinating with no loss of balance.   Standing balance support: Bilateral upper extremity supported, Reliant on assistive device for balance, During functional activity Standing balance-Leahy Scale: Fair Standing balance comment: patient is relying heavily on rolling walker for support with dynamic standing activity                             Pertinent Vitals/Pain Pain Assessment Pain Assessment: No/denies pain    Home Living Family/patient expects to be discharged to:: Private residence Living Arrangements: Children Available Help at Discharge:  (No family support per patient report) Type of Home: Apartment Home Access: Stairs to enter       Home Layout: One level Home Equipment: Conservation officer, nature (2 wheels);Shower seat Additional Comments: patient had a stroke earlier this year and completed inpatient rehab after hospital discharge.    Prior Function Prior Level of Function : Independent/Modified Independent             Mobility Comments: Mod I with rolling walker for ambulation ADLs Comments: intermittent assistance with cooking. she does not drive. patient reports she is Mod I for bathing and dressing     Hand Dominance   Dominant Hand: Right    Extremity/Trunk Assessment   Upper Extremity Assessment Upper Extremity Assessment: LUE deficits/detail (RUE grossly WFL) LUE Deficits / Details: patient reports chronic weakness from prior stroke. active shoulder flexion to less than 90 degrees with pain a shoulder reported. fair grip strength    Lower Extremity Assessment Lower Extremity Assessment: LLE deficits/detail;RLE deficits/detail RLE Deficits / Details: knee extension 5/5, ankle dorsiflexion/plantarflexion 5/5 RLE Sensation: WNL LLE Deficits / Details: knee extension 4/5, dorsiflexion 4/5, plantarflexion 4/5 LLE Sensation: decreased light touch (from mid tibia distally, impaired light touch (residual from prior stroke  per patient report))       Communication   Communication: No difficulties  Cognition Arousal/Alertness: Awake/alert Behavior During Therapy: Flat affect Overall Cognitive Status: Within Functional Limits for tasks assessed                                 General Comments: patient is able to follow all directions without difficultty during my evaluation.        General Comments      Exercises     Assessment/Plan    PT Assessment Patient needs continued PT services  PT Problem List Decreased strength;Decreased range of motion;Decreased activity tolerance;Decreased balance;Decreased mobility;Decreased safety awareness       PT Treatment Interventions DME instruction;Gait training;Stair training;Functional mobility training;Therapeutic exercise;Therapeutic activities;Balance training;Neuromuscular re-education;Patient/family education    PT Goals (Current goals can be found in the Care Plan section)  Acute Rehab PT Goals Patient Stated Goal: to be discharged to inpatient rehab PT Goal Formulation: With patient Time For Goal Achievement: 06/10/22 Potential to Achieve Goals: Good    Frequency Min 3X/week     Co-evaluation               AM-PAC PT "6 Clicks" Mobility  Outcome Measure Help needed turning from your back to your side while in a flat bed without using bedrails?: None Help needed moving from lying on your back to sitting on the side of a flat bed without using bedrails?: A Little Help needed moving to and from a bed to a chair (  including a wheelchair)?: A Little Help needed standing up from a chair using your arms (e.g., wheelchair or bedside chair)?: A Little Help needed to walk in hospital room?: A Little Help needed climbing 3-5 steps with a railing? : A Lot 6 Click Score: 18    End of Session   Activity Tolerance: Patient tolerated treatment well Patient left: in chair;with call bell/phone within reach;with chair alarm set Nurse  Communication: Mobility status PT Visit Diagnosis: Muscle weakness (generalized) (M62.81);Other abnormalities of gait and mobility (R26.89)    Time: 1432-1510 PT Time Calculation (min) (ACUTE ONLY): 38 min   Charges:   PT Evaluation $PT Eval Low Complexity: 1 Low PT Treatments $Gait Training: 8-22 mins       Minna Merritts, PT, MPT   Percell Locus 06/03/2022, 3:40 PM

## 2022-06-03 NOTE — Progress Notes (Signed)
  Progress Note    06/03/2022 8:17 AM 1 Day Post-Op  Subjective: Still no vision left eye  Vitals:   06/02/22 2336 06/03/22 0333  BP: (!) 155/87 (!) 154/77  Pulse: (!) 102 92  Resp: 16 17  Temp: 98.7 F (37.1 C) 98.3 F (36.8 C)  SpO2: 91% 99%    Physical Exam: Awake alert and oriented  Left facial incision healing well   CBC    Component Value Date/Time   WBC 8.6 06/02/2022 0453   RBC 3.74 (L) 06/02/2022 0453   HGB 10.5 (L) 06/02/2022 0453   HCT 32.6 (L) 06/02/2022 0453   PLT 249 06/02/2022 0453   MCV 87.2 06/02/2022 0453   MCH 28.1 06/02/2022 0453   MCHC 32.2 06/02/2022 0453   RDW 14.1 06/02/2022 0453   LYMPHSABS 3.1 05/31/2022 1410   MONOABS 0.5 05/31/2022 1410   EOSABS 0.2 05/31/2022 1410   BASOSABS 0.0 05/31/2022 1410    BMET    Component Value Date/Time   NA 138 06/02/2022 0453   K 3.4 (L) 06/02/2022 0453   CL 105 06/02/2022 0453   CO2 24 06/02/2022 0453   GLUCOSE 166 (H) 06/02/2022 0453   BUN 25 (H) 06/02/2022 0453   CREATININE 1.28 (H) 06/02/2022 0453   CALCIUM 7.4 (L) 06/02/2022 0453   GFRNONAA 49 (L) 06/02/2022 0453   GFRAA >60 06/18/2020 1139    INR    Component Value Date/Time   INR 1.2 06/01/2022 0120     Intake/Output Summary (Last 24 hours) at 06/03/2022 0817 Last data filed at 06/02/2022 2100 Gross per 24 hour  Intake 510 ml  Output 20 ml  Net 490 ml     Assessment:  56 y.o. female is s/p left temporal artery biopsy 1 Day Post-Op  Plan: Follow-up pathology  She can see me on an as-needed basis.   Chanya Chrisley C. Randie Heinz, MD [facility] Office: [phone] Pager: [phone]  06/03/2022 8:17 AM

## 2022-06-03 NOTE — TOC Initial Note (Signed)
Transition of Care [facility]) - Initial/Assessment Note    Patient Details  Name: Erin Jackson MRN: [id number] Date of Birth: January 14, 1966  Transition of Care [facility]) CM/SW Contact:    Pollie Friar, RN Phone Number: 06/03/2022, 11:44 AM  Clinical Narrative:                 Patient is from home alone. She uses walker at baseline. She has been managing her own medications and denies any issues.  Pt uses medicaid transportation for appointments.  She states she has no support at home.  Awaiting PT/OT evals.  TOC following.  Expected Discharge Plan: Home/Self Care Barriers to Discharge: Continued Medical Work up   Patient Goals and CMS Choice        Expected Discharge Plan and Services Expected Discharge Plan: Home/Self Care   Discharge Planning Services: CM Consult   Living arrangements for the past 2 months: Apartment                                      Prior Living Arrangements/Services Living arrangements for the past 2 months: Apartment Lives with:: Self Patient language and need for interpreter reviewed:: Yes Do you feel safe going back to the place where you live?: Yes        Care giver support system in place?: Yes (comment) Current home services: DME (walker) Criminal Activity/Legal Involvement Pertinent to Current Situation/Hospitalization: No - Comment as needed  Activities of Daily Living      Permission Sought/Granted                  Emotional Assessment Appearance:: Appears stated age Attitude/Demeanor/Rapport: Engaged Affect (typically observed): Flat Orientation: : Oriented to Self, Oriented to Place, Oriented to  Time, Oriented to Situation   Psych Involvement: No (comment)  Admission diagnosis:  Temporal arteritis ([place]) [M31.6] Vision loss [H54.7] Monocular vision loss [H54.60] Patient Active Problem List   Diagnosis Date Noted   Vision loss 06/01/2022   Temporal arteritis ([place]) [id number]   Embolic stroke ([place]) 0000000    Stage 3b chronic kidney disease ([place])    Chronic systolic heart failure ([place])    Cerebral embolism with cerebral infarction 04/22/2022   Hypertensive urgency 04/21/2022   Altered mental status    Dizziness    Nausea    CAD (coronary artery disease) 04/10/2022   PVD (peripheral vascular disease) ([place]) 04/10/2022   Ischemic cardiomyopathy [id number]   Acute systolic CHF (congestive heart failure) ([place]) 04/10/2022   Allergic reaction 04/08/2022   Angina at rest [facility]) 04/08/2022   Osteoarthritis of carpometacarpal ([place]) joint of right thumb 06/18/2020   Primary osteoarthritis of first carpometacarpal joint of right hand 11/09/2019   Right carpal tunnel syndrome 11/09/2019   De Quervain's tenosynovitis, right 11/09/2019   Arthritis of carpometacarpal ([facility]) joints of both thumbs 11/04/2017   Chronic neck pain 08/20/2017   Supraclavicular mass 08/20/2017   Primary hypertension 08/20/2017   T2DM (type 2 diabetes mellitus) ([place]) 08/20/2017   GERD (gastroesophageal reflux disease) 08/20/2017   Cigarette nicotine dependence without complication 0000000   PCP:  Pcp, No Pharmacy:   [facility] [place], [place] [address] [place] [place] [address] Phone: [phone] Fax: [phone]  [facility] [address] [place] [address] Phone: [phone] Fax: [phone]  Social Determinants of Health (SDOH) Interventions    Readmission Risk Interventions     No data to display           

## 2022-06-03 NOTE — Progress Notes (Signed)
Neurology Progress Note Erin Jackson MR# [id number] 06/03/2022   S: no overnight events; no new complaints but continues to complain of complete vision loss in left eye, left sided headache as well as numbness and coldness of toes on left foot   O: Current vital signs: BP (!) 155/65 (BP Location: Left Arm)   Pulse 100   Temp 98.2 F (36.8 C) (Oral)   Resp 18   Ht '5\' 2"'  (1.575 m)   Wt 90.7 kg   SpO2 100%   BMI 36.58 kg/m  Vital signs in last 24 hours: Temp:  [97.8 F (36.6 C)-98.7 F (37.1 C)] 98.2 F (36.8 C) (06/14 0800) Pulse Rate:  [92-115] 100 (06/14 0800) Resp:  [14-21] 18 (06/14 0800) BP: (136-184)/(55-101) 155/65 (06/14 0800) SpO2:  [91 %-100 %] 100 % (06/14 0800) Weight:  [90.7 kg] 90.7 kg (06/13 1101) GENERAL: Awake, alert in NAD HEENT: Normocephalic and atraumatic, moist mm LUNGS: symmetric excursions bilaterally with no audible wheezes. CV: RR, equal pulses bilaterally. ABDOMEN: Soft, nontender, nondistended Ext: warm, well perfused, intact peripheral pulses to left foot NEURO:  Mental Status: AA&Ox3 Language: speech is clear and fluent without dysarthria.  Intact comprehension. Right pupil round and reactive, left pupil sluggish.  Complete vision loss in left eye, unable to perceive movement or light  No evidence of tongue atrophy or fibrillations Motor: 5/5 strength in all extremities. Tone: Tone and bulk is normal Sensation: Intact to light touch bilaterally Gait - Deferred   Labs C-reactive protein <0.5 ESR 69 CBC    Component Value Date/Time   WBC 8.6 06/02/2022 0453   RBC 3.74 (L) 06/02/2022 0453   HGB 10.5 (L) 06/02/2022 0453   HCT 32.6 (L) 06/02/2022 0453   PLT 249 06/02/2022 0453   MCV 87.2 06/02/2022 0453   MCH 28.1 06/02/2022 0453   MCHC 32.2 06/02/2022 0453   RDW 14.1 06/02/2022 0453   LYMPHSABS 3.1 05/31/2022 1410   MONOABS 0.5 05/31/2022 1410   EOSABS 0.2 05/31/2022 1410   BASOSABS 0.0 05/31/2022 1410      Latest Ref Rng &  Units 06/02/2022    4:53 AM 05/31/2022    2:10 PM 05/29/2022   11:58 AM  BMP  Glucose 70 - 99 mg/dL 166  140  184   BUN 6 - 20 mg/dL '25  20  16   ' Creatinine 0.44 - 1.00 mg/dL 1.28  1.24  1.24   Sodium 135 - 145 mmol/L 138  138  139   Potassium 3.5 - 5.1 mmol/L 3.4  3.9  3.8   Chloride 98 - 111 mmol/L 105  107  105   CO2 22 - 32 mmol/L '24  20  23   ' Calcium 8.9 - 10.3 mg/dL 7.4  7.1  7.2      Imaging I have reviewed images in epic and the results pertinent to this consultation are: CT Head showed No acute intracranial process CTA head showed no LVO, 50% stenosis of left ICA MRI Brain showed mild chronic small vessel ischemic disease, old right pontine and left basal ganglia infarcts, normal orbits  Assessment: Erin Jackson is a 56 y.o. female PMHx of HTN, DM, CAD, cardiomyopathy, PVD, HLD, smoking and stroke in 5/23 who presented with left sided vision loss along with left sided headache, scalp tenderness and left orbital pain.  Left sided vision loss is now complete with patient unable to perceive light in left eye.  Unfortunately, vision is not likely to return.  Temporal artery  biopsy completed yesterday- awaiting pathology results. ESR elevated to 69 today, likely an acute change after biopsy.  CRP normal.  She continues to complain of a throbbing left sided headache which extends from her forehead to her neck as well as continued scalp tenderness.  She also complains of coldness and numbness in the toes of her left foot which has been present since her stroke in May.  Left foot has good pedal pulse as well as good motion and capillary refill.  Neurology will sign off but will remain available for any questions or concerns.  Recommendations: Continue prednisone 60 mg daily for suspected giant cell arteritis Await temporal artery biopsy results Consider low dose gabapentin for symptoms of foot numbness Follow up with outpatient rheumatology and opthalmology   Donna , MSN,  AGACNP-BC [facility] See Amion for schedule and pager information 06/03/2022 10:02 AM    If 7pm- 7am, please page neurology on call as listed in Circle.

## 2022-06-04 DIAGNOSIS — H547 Unspecified visual loss: Secondary | ICD-10-CM | POA: Diagnosis not present

## 2022-06-04 LAB — GLUCOSE, CAPILLARY
Glucose-Capillary: 171 mg/dL — ABNORMAL HIGH (ref 70–99)
Glucose-Capillary: 134 mg/dL — ABNORMAL HIGH (ref 70–99)
Glucose-Capillary: 454 mg/dL — ABNORMAL HIGH (ref 70–99)
Glucose-Capillary: 369 mg/dL — ABNORMAL HIGH (ref 70–99)

## 2022-06-04 LAB — SURGICAL PATHOLOGY

## 2022-06-04 MED ORDER — METOPROLOL TARTRATE 5 MG/5ML IV SOLN
2.5000 mg | Freq: Once | INTRAVENOUS | Status: AC
Start: 2022-06-04 — End: 2022-06-04
  Administered 2022-06-04: 2.5 mg via INTRAVENOUS
  Filled 2022-06-04: qty 5

## 2022-06-04 MED ORDER — ORAL CARE MOUTH RINSE: 15.0000 mL | OROMUCOSAL | Status: DC | PRN

## 2022-06-04 MED ORDER — METFORMIN HCL 500 MG PO TABS: 1000.0000 mg | ORAL_TABLET | Freq: Two times a day (BID) | ORAL | Status: DC

## 2022-06-04 MED ORDER — INSULIN ASPART 100 UNIT/ML IJ SOLN
0.0000 [IU] | Freq: Three times a day (TID) | INTRAMUSCULAR | Status: DC
  Administered 2022-06-04: 3 [IU] via SUBCUTANEOUS
  Administered 2022-06-04: 20 [IU] via SUBCUTANEOUS
  Administered 2022-06-05: 15 [IU] via SUBCUTANEOUS
  Administered 2022-06-05: 7 [IU] via SUBCUTANEOUS
  Administered 2022-06-06: 3 [IU] via SUBCUTANEOUS
  Administered 2022-06-06: 4 [IU] via SUBCUTANEOUS
  Administered 2022-06-06: 7 [IU] via SUBCUTANEOUS
  Administered 2022-06-07: 3 [IU] via SUBCUTANEOUS
  Administered 2022-06-07: 15 [IU] via SUBCUTANEOUS
  Administered 2022-06-08 (×2): 3 [IU] via SUBCUTANEOUS

## 2022-06-04 MED ORDER — METFORMIN HCL 500 MG PO TABS
1000.0000 mg | ORAL_TABLET | Freq: Two times a day (BID) | ORAL | Status: DC
  Administered 2022-06-04 – 2022-06-08 (×8): 1000 mg via ORAL
  Filled 2022-06-04 (×8): qty 2

## 2022-06-04 MED ORDER — METFORMIN HCL 500 MG PO TABS: 500.0000 mg | ORAL_TABLET | Freq: Two times a day (BID) | ORAL | Status: DC

## 2022-06-04 MED ORDER — METFORMIN HCL 500 MG PO TABS: 500.0000 mg | ORAL_TABLET | Freq: Every day | ORAL | Status: DC

## 2022-06-04 MED ORDER — INSULIN ASPART 100 UNIT/ML IJ SOLN
0.0000 [IU] | Freq: Every day | INTRAMUSCULAR | Status: DC
  Administered 2022-06-04: 5 [IU] via SUBCUTANEOUS
  Administered 2022-06-06: 3 [IU] via SUBCUTANEOUS
  Administered 2022-06-07: 2 [IU] via SUBCUTANEOUS

## 2022-06-04 MED ORDER — INSULIN GLARGINE-YFGN 100 UNIT/ML ~~LOC~~ SOLN
15.0000 [IU] | Freq: Every day | SUBCUTANEOUS | Status: DC
  Administered 2022-06-04 – 2022-06-05 (×2): 15 [IU] via SUBCUTANEOUS
  Filled 2022-06-04 (×2): qty 0.15

## 2022-06-04 NOTE — Progress Notes (Addendum)
Inpatient Rehab Admissions Coordinator:   Consult received and chart reviewed.  Note admitted with temporal arteritis; vision does not appear to be improving with steroids.  PT evaluation completed and pt is supervision to min guard with mobility up to 75'.  Pt on CIR previously for CVA and discharged home, hoping to move to [place] with her son [name] but it does not appear this has been able to occur.    At this time, two barriers to CIR admission are identified.  [employer] Medicare is highly unlikely to approve CIR for diagnosis and for current level of function.  With pt mobilizing with so little assist, she does not demonstrate the need for intensive/3 hrs daily therapy in at least 2 disciplines.  When considering her vision loss, goals at discharge from CIR would be supervision/min guard (which is where she is currently performing) so she does not demonstrate the potential for large gains in a short period of time.  Would recommend alternative options be explored.   We will sign off.    Addendum: 1135: I updated pt at bedside.    Estill Dooms, PT, DPT Admissions Coordinator [phone] 06/04/22  11:00 AM

## 2022-06-04 NOTE — Progress Notes (Signed)
PROGRESS NOTE    Erin Jackson  MRN:[id number] DOB: 01/11/1966 DOA: 05/31/2022 PCP: Pcp, No   Brief Narrative:  Erin Jackson is a 56 y.o. female with history of recent admission for stroke last month, diabetes mellitus, hypertension, chronic kidney disease stage II, chronic pain, peripheral vascular disease presented to the ER after patient had persistent loss of vision since waking up in the morning on May 30, 2022.  No other complaint.   In the ER patient had MRI brain and MRI orbits CT angiogram of head and neck.  Neurology evaluated the patient at this time patient's symptoms are concerning for temporal arteritis started on prednisone admitted for further work-up.  ESR elevated.  Vascular consulted, temporal artery biopsy done yesterday  Assessment & Plan:   Principal Problem:   Vision loss Active Problems:   T2DM (type 2 diabetes mellitus) (HCC)   CAD (coronary artery disease)   Stage 3b chronic kidney disease (HCC)   Chronic systolic heart failure (HCC)   Temporal arteritis (HCC)  Sudden loss of vision left eye/possible temporal arteritis: Patient presents with sudden loss of vision, left-sided headache and has tenderness on the left temporal area with ESR over 50 and she is 56 as well.  She meets criteria for temporal arteritis.  She has been seen by neurology.  Has been started on prednisone in an effort to reverse her vision loss however her vision has not improved. Likely unrecoverable. Vascular was consulted and patient had temporal artery biopsy. CIR evaluated and not a candidate. Prednisone 60mg QD for 3-5 months. She will need outpatient follow up with Rheumatology and Opthamology.  Recent ischemic stroke: No focal deficit.  Continue statin and Plavix  Hypertensive urgency: uncontrolled. Clonidine home dose increased. Started losartan.   Anxiety: resumed home Xanax. Started low dose SSRI. Feels depressed as would be expected with new onset vision loss. Psych consulted  due to concern for possible SI.   Diabetes mellitus type 2: Last hemoglobin A1c was 6.8 just last month.  She takes metformin at home.  Having significant steroid induced hyperglycemia. Now on long acting insulin and SSI. Increased home metformin dose. Will need to discharge on insulin given she will be on prednisone for several months going forward.  CKD stage IIIA: At baseline.  Monitor.  DVT prophylaxis: SCDs   Code Status: Full Code  Family Communication: No family present at bedside.  Status is: Inpatient Remains inpatient appropriate because: Psych eval     Estimated body mass index is 36.58 kg/m as calculated from the following:   Height as of this encounter: 5' 2" (1.575 m).   Weight as of this encounter: 90.7 kg.    Nutritional Assessment: Body mass index is 36.58 kg/m.. Seen by dietician.  I agree with the assessment and plan as outlined below: Nutrition Status:   Skin Assessment: I have examined the patient's skin and I agree with the wound assessment as performed by the wound care RN as outlined below:    Consultants:  Neurology Vascular surgery Procedures:  None  Antimicrobials:  Anti-infectives (From admission, onward)    Start     Dose/Rate Route Frequency Ordered Stop   06/02/22 0600  cefUROXime (ZINACEF) 1.5 g in sodium chloride 0.9 % 100 mL IVPB        1.5 g 200 mL/hr over 30 Minutes Intravenous On call to O.R. 06/01/22 0851 06/02/22 1300         Subjective:  Seen and examined.  No new complaint.    No improvement in the vision.  Feeling depressed but denies SI.  Objective: Vitals:   06/04/22 0806 06/04/22 1111 06/04/22 1458 06/04/22 1659  BP: (!) 163/99 (!) 161/107 (!) 183/126 (!) 163/102  Pulse: (!) 119 (!) 121  (!) 139  Resp: 18 18    Temp: 98.2 F (36.8 C) 98.1 F (36.7 C)    TempSrc: Oral Oral    SpO2: 100% 100%  100%  Weight:      Height:       No intake or output data in the 24 hours ending 06/04/22 1747  Filed Weights    06/02/22 1101  Weight: 90.7 kg    Examination:  General exam: Appears calm and comfortable  Respiratory system: Clear to auscultation. Respiratory effort normal. Cardiovascular system: S1 & S2 heard, RRR. No JVD, murmurs, rubs, gallops or clicks. No pedal edema. Gastrointestinal system: Abdomen is nondistended, soft and nontender. No organomegaly or masses felt. Normal bowel sounds heard. Central nervous system: Alert and oriented. No focal neurological deficits.  Left temporal tenderness. Extremities: Symmetric 5 x 5 power. Skin: No rashes, lesions or ulcers.  Psychiatry: Flat affect/  Data Reviewed: I have personally reviewed following labs and imaging studies  CBC: Recent Labs  Lab 05/29/22 1158 05/31/22 1410 06/02/22 0453  WBC 5.8 6.6 8.6  NEUTROABS 2.3 2.9  --   HGB 11.5* 10.9* 10.5*  HCT 36.8 34.6* 32.6*  MCV 91.5 92.0 87.2  PLT 280 266 147    Basic Metabolic Panel: Recent Labs  Lab 05/29/22 1158 05/31/22 1410 06/02/22 0453  NA 139 138 138  K 3.8 3.9 3.4*  CL 105 107 105  CO2 23 20* 24  GLUCOSE 184* 140* 166*  BUN 16 20 25*  CREATININE 1.24* 1.24* 1.28*  CALCIUM 7.2* 7.1* 7.4*    GFR: Estimated Creatinine Clearance: 50.1 mL/min (A) (by C-G formula based on SCr of 1.28 mg/dL (H)). Liver Function Tests: Recent Labs  Lab 05/29/22 1158 05/31/22 1410  AST 17 21  ALT 12 10  ALKPHOS 59 72  BILITOT 0.5 0.4  PROT 6.7 6.5  ALBUMIN 3.0* 2.9*    No results for input(s): "LIPASE", "AMYLASE" in the last 168 hours. No results for input(s): "AMMONIA" in the last 168 hours. Coagulation Profile: Recent Labs  Lab 06/01/22 0120  INR 1.2    Cardiac Enzymes: No results for input(s): "CKTOTAL", "CKMB", "CKMBINDEX", "TROPONINI" in the last 168 hours. BNP (last 3 results) No results for input(s): "PROBNP" in the last 8760 hours. HbA1C: No results for input(s): "HGBA1C" in the last 72 hours. CBG: Recent Labs  Lab 06/03/22 1702 06/03/22 2112 06/04/22 0634  06/04/22 1110 06/04/22 1602  GLUCAP 482* 316* 171* 134* 454*    Lipid Profile: No results for input(s): "CHOL", "HDL", "LDLCALC", "TRIG", "CHOLHDL", "LDLDIRECT" in the last 72 hours. Thyroid Function Tests: No results for input(s): "TSH", "T4TOTAL", "FREET4", "T3FREE", "THYROIDAB" in the last 72 hours. Anemia Panel: No results for input(s): "VITAMINB12", "FOLATE", "FERRITIN", "TIBC", "IRON", "RETICCTPCT" in the last 72 hours. Sepsis Labs: No results for input(s): "PROCALCITON", "LATICACIDVEN" in the last 168 hours.  No results found for this or any previous visit (from the past 240 hour(s)).   Radiology Studies: No results found.  Scheduled Meds:  allopurinol  100 mg Oral Daily   atorvastatin  80 mg Oral Daily   cloNIDine  0.1 mg Oral TID   clopidogrel  75 mg Oral Daily   enoxaparin (LOVENOX) injection  40 mg Subcutaneous Q24H   escitalopram  5 mg Oral Daily   hydrOXYzine  25 mg Oral TID   insulin aspart  0-20 Units Subcutaneous TID WC   insulin aspart  0-5 Units Subcutaneous QHS   insulin glargine-yfgn  15 Units Subcutaneous Daily   losartan  25 mg Oral Daily   metFORMIN  1,000 mg Oral BID WC   predniSONE  60 mg Oral Q breakfast   traZODone  150 mg Oral QHS       LOS: 3 days   Shayan S Anwar, MD [facility]  06/04/2022, 5:47 PM   *Please note that this is a verbal dictation therefore any spelling or grammatical errors are due to the "Dragon [employer] One" system interpretation.  Please page via Amion and do not message via secure chat for urgent patient care matters. Secure chat can be used for non urgent patient care matters.  How to contact the [facility] Attending or Consulting provider 7A - 7P or covering provider during after hours 7P -7A, for this patient?  Check the care team in [facility] and look for a) attending/consulting [facility] provider listed and b) the [facility] team listed. Page or secure chat 7A-7P. Log into [email or web address] and use [facility] universal password to  access. If you do not have the password, please contact the hospital operator. Locate the [facility] provider you are looking for under [facility] and page to a number that you can be directly reached. If you still have difficulty reaching the provider, please page the DOC (Director on Call) for the Hospitalists listed on amion for assistance.  

## 2022-06-04 NOTE — TOC Progression Note (Signed)
Transition of Care [facility]) - Progression Note    Patient Details  Name: BEDELIA PONG MRN: [id number] Date of Birth: 1966/09/15  Transition of Care [facility] CM/SW Contact  Kermit Balo, RN Phone Number: 06/04/2022, 3:48 PM  Clinical Narrative:    Patient will not qualify for CIR. CM has spoken with her and she is agreeable to SNF rehab. CSW to fax her out. Pt asking for [facility]. Will provide her bed offers in the am.    Expected Discharge Plan: Home/Self Care Barriers to Discharge: Continued Medical Work up  Expected Discharge Plan and Services Expected Discharge Plan: Home/Self Care   Discharge Planning Services: CM Consult   Living arrangements for the past 2 months: Apartment                                       Social Determinants of Health (SDOH) Interventions    Readmission Risk Interventions     No data to display

## 2022-06-04 NOTE — Evaluation (Signed)
Occupational Therapy Evaluation Patient Details Name: Erin Jackson MRN: [id number] DOB: [birth year] Today's Date: 06/04/2022   History of Present Illness Pt is a 56 y/o F presenting on 6/11 with sudden loss of vision, L sided headache.  MRI concerning for temporal arteritis.  S/P temporal artery biopsy 6/13. Noted recent admission for CVA last month. PMH of HTN, DM, Anxiety, GERD, and recent heart cath on 4/20.   Clinical Impression   PTA patient reports using RW for mobility, independent for Adls and limited IADls.  She was admitted for above and limited by problem list below, including impaired vision on L side, decreased activity tolerance, generalized weakness, impaired balance.  She presents with flat affects, voices depression and frustrated with her situation; although cognition appears [facility]. She completes ADLs with min assist to supervision, using RW for mobility given increased time. Pt beginning to compensate for L visual deficits, requiring min cueing for safety with navigation.  Will follow acutely to optimize independence, safety and return to PLOF.      Recommendations for follow up therapy are one component of a multi-disciplinary discharge planning process, led by the attending physician.  Recommendations may be updated based on patient status, additional functional criteria and insurance authorization.   Follow Up Recommendations  Acute inpatient rehab (3hours/day)    Assistance Recommended at Discharge Intermittent Supervision/Assistance  Patient can return home with the following A little help with walking and/or transfers;A little help with bathing/dressing/bathroom;Assistance with cooking/housework;Direct supervision/assist for medications management;Direct supervision/assist for financial management;Assist for transportation;Help with stairs or ramp for entrance    Functional Status Assessment  Patient has had a recent decline in their functional status and demonstrates  the ability to make significant improvements in function in a reasonable and predictable amount of time.  Equipment Recommendations  Other (comment) (defer)    Recommendations for Other Services       Precautions / Restrictions Precautions Precautions: Fall Restrictions Weight Bearing Restrictions: No      Mobility Bed Mobility               General bed mobility comments: EOB upon entry    Transfers                          Balance Overall balance assessment: Needs assistance Sitting-balance support: Feet supported Sitting balance-Leahy Scale: Good     Standing balance support: No upper extremity supported, During functional activity, Bilateral upper extremity supported Standing balance-Leahy Scale: Fair Standing balance comment: no UE support during ADLs with supervision, relies on RW dyanmically                           ADL either performed or assessed with clinical judgement   ADL Overall ADL's : Needs assistance/impaired     Grooming: Supervision/safety;Standing           Upper Body Dressing : Set up;Sitting   Lower Body Dressing: Min guard;Sit to/from stand   Toilet Transfer: Min guard;Ambulation;Rolling walker (2 wheels)   Toileting- Clothing Manipulation and Hygiene: Supervision/safety;Sit to/from stand       Functional mobility during ADLs: Min guard;Rolling walker (2 wheels) General ADL Comments: min guard using RW for safety, reviewing visual compensatory techniques during ADls and mobility.  Scanning during session with min cueing.     Vision Baseline Vision/History: 1 Wears glasses (reading) Ability to See in Adequate Light: 0 Adequate Patient Visual Report: Other (comment) (L eye  vision loss) Vision Assessment?: Yes Eye Alignment: Within Functional Limits Ocular Range of Motion: Within Functional Limits Depth Perception: Undershoots Additional Comments: limited assessment as pt is very down about vision  deficits, assessed more functionally.  She reports some blurry vision with R eye but improved with reading glasses-- she has no difficulty reading her phone.  She undershoots when reaching during finger/nose, but reaching normally when completing ADLs.  She does not blink to threat on L side, and reports L eye is completely dark.     Perception     Praxis      Pertinent Vitals/Pain Pain Assessment Pain Assessment: Faces Faces Pain Scale: Hurts a little bit Pain Location: L LE Pain Descriptors / Indicators: Discomfort Pain Intervention(s): Limited activity within patient's tolerance, Monitored during session, Repositioned     Hand Dominance Right   Extremity/Trunk Assessment Upper Extremity Assessment Upper Extremity Assessment: LUE deficits/detail;Generalized weakness LUE Deficits / Details: limited shoulder flexion to 90*, reports weakness from prior CVA but able to functionally   Lower Extremity Assessment Lower Extremity Assessment: Defer to PT evaluation       Communication Communication Communication: No difficulties   Cognition Arousal/Alertness: Awake/alert Behavior During Therapy: Flat affect Overall Cognitive Status: Within Functional Limits for tasks assessed                                 General Comments: appears WFL, but depressed about situation.  Function cog using pill box maybe beneficial     General Comments  pt reports having no support at home, she is requesting rehab at dc.    Exercises     Shoulder Instructions      Home Living Family/patient expects to be discharged to:: Private residence Living Arrangements: Children Available Help at Discharge:  (no family support per patient report) Type of Home: Apartment Home Access: Stairs to enter [employer] of Steps: 4 Entrance Stairs-Rails: None Home Layout: One level     Bathroom Shower/Tub: Chief Strategy Officer: Standard     Home Equipment: Clinical biochemist (2 wheels);Tub bench   Additional Comments: patient had a stroke earlier this year and completed inpatient rehab after hospital discharge.      Prior Functioning/Environment Prior Level of Function : Independent/Modified Independent             Mobility Comments: using RW for mobility ADLs Comments: intermittent assistance with cooking. she does not drive. independent with ADLs, reports she has been managing        OT Problem List: Decreased strength;Decreased activity tolerance;Decreased range of motion;Impaired balance (sitting and/or standing);Impaired vision/perception;Decreased coordination;Decreased knowledge of use of DME or AE;Decreased knowledge of precautions;Obesity;Impaired UE functional use      OT Treatment/Interventions: Self-care/ADL training;DME and/or AE instruction;Therapeutic activities;Visual/perceptual remediation/compensation;Patient/family education;Balance training;Neuromuscular education    OT Goals(Current goals can be found in the care plan section) Acute Rehab OT Goals Patient Stated Goal: get to rehab OT Goal Formulation: With patient Time For Goal Achievement: 06/18/22 Potential to Achieve Goals: Good  OT Frequency: Min 3X/week    Co-evaluation              AM-PAC OT "6 Clicks" Daily Activity     Outcome Measure Help from another person eating meals?: None Help from another person taking care of personal grooming?: A Little Help from another person toileting, which includes using toliet, bedpan, or urinal?: A Little Help from another person bathing (  including washing, rinsing, drying)?: A Little Help from another person to put on and taking off regular upper body clothing?: A Little Help from another person to put on and taking off regular lower body clothing?: A Little 6 Click Score: 19   End of Session Equipment Utilized During Treatment: Rolling walker (2 wheels) Nurse Communication: Mobility status  Activity Tolerance:  Patient tolerated treatment well Patient left: in bed;with call bell/phone within reach  OT Visit Diagnosis: Other abnormalities of gait and mobility (R26.89);Muscle weakness (generalized) (M62.81);Low vision, both eyes (H54.2)                Time: [id number] OT Time Calculation (min): 30 min Charges:  OT General Charges $OT Visit: 1 Visit OT Evaluation $OT Eval Moderate Complexity: 1 Mod OT Treatments $Self Care/Home Management : 8-22 mins  Jolaine Artist, OT Acute Rehabilitation Services Office [phone]   Delight Stare 06/04/2022, 11:01 AM

## 2022-06-04 NOTE — Consult Note (Signed)
[facility] New Face-to-Face Psychiatric Evaluation   Service Date: June 04, 2022 LOS:  LOS: 3 days    Assessment  Erin Jackson is a 56 y.o. female admitted medically for 05/31/2022  1:05 PM for likely temporal arteritis. She carries the psychiatric diagnoses of bipolar disorder and has a past medical history of  recent stroke, back pain, DMII, GERD and HTN. Psychiatry was consulted for suicidal statements in text to son by Erin Jackson.    Her current presentation of sending concerning texts to her son is most consistent with adjustment disorder with depressed mood. On review, these text messages were clearly sent to address a perceived lack of support from her son - she did not threaten suicide in these messages but did reference her own death. She does endorse years of low mood, motivation, isolation consistent with MDD/recurrent/severe - this episode seems to have started about 2 years ago. Although she was previously diagnosed with bipolar disorder she denied any history of mania; feel this is likely erroneous - furthermore no psychiatric hospitalizations since 2005 argue against this diagnosis. She has no desire to go to an inpatient psychiatric unit and does not meet criteria for IVC.   Current outpatient psychotropic medications include alprazolam 2 mg TID and historically she has had a good response to these medications - although generally disagree with chronic BZD use tapering requires pt buy-in and is best done as an outpt. Escitalopram was started by the primary team prior to psych consult - would continue this.. On initial examination, patient expressed her struggles with diagnosis but and concerns about the future but denied active suicidal ideations and demonstrated future-oriented thought throughout conversation. Please see plan below for detailed recommendations.   Diagnoses:  Active Hospital problems: Principal Problem:   Vision loss Active Problems:   T2DM  (type 2 diabetes mellitus) ([place])   CAD (coronary artery disease)   Stage 3b chronic kidney disease ([place])   Chronic systolic heart failure (HCC)   Temporal arteritis ([place])     Plan  ## Safety and Observation Level:  - Based on my clinical evaluation, I estimate the patient to be at low risk of self harm in the current setting - At this time, we recommend a routine level of observation. This decision is based on my review of the chart including patient's history and current presentation, interview of the patient, mental status examination, and consideration of suicide risk including evaluating suicidal ideation, plan, intent, suicidal or self-harm behaviors, risk factors, and protective factors. This judgment is based on our ability to directly address suicide risk, implement suicide prevention strategies and develop a safety plan while the patient is in the clinical setting. Please contact our team if there is a concern that risk level has changed.   ## Medications:  -- Continue escitalopram and alprazolam per primary team  ## Medical Decision Making Capacity:  Not formally assessed  ## Further Work-up:  -- B12   - recent TSH, vit D WNL  -- most recent EKG on 6/11 had QtC of 482 (making escitalopram good choice) -- Pertinent labwork reviewed earlier this admission includes: EtOH <10   ## Misc:  -- recommend chaplain consult  ## Disposition:  -- per primary -- TOC prior to dc for f/u counselling    Thank you for this consult request. Recommendations have been communicated to the primary team.  We will continue to follow at this time.   Erin Jackson A Erin Jackson   New history  Relevant Aspects of Hospital Course:  Admitted on 05/31/2022 for likely temporal arteritis; had recent admission for stroke.  Patient Report:  Patient seen in afternoon. She is no surprised that a psychiatrist was asked to see her and assumed it was about something she texted her son. She shares fear  over what will happen to her now that she depends on others - has been fairly isolated for many years since her oldest son left to move to [place] where most of her family is. She is hurt her son Erin Jackson) who she lives with has not been to the hospital and shares that she was trying to get him to come to the hospital. She states that she is tired of everyone saying "it will be OK" and wishes her family would give her more concrete commitments to support/help. She cites her oldest grandson as her main reason for living.   She endorses difficulty sleep (worse in hospital) partially relieved by trazodone, denies anhedonia (but can't do most activities she used to enjoy b/c of recen strokes), endorses feelings of worthlessness, endorses poor energy, poor concentration, but denies suicidality.   She endorses a string of 3 psych hospitalizations in 2004-04-21 following her parents' death - tried to walk into traffic at one point. Found 3rd hospitalization helpful because it taught her "how to grieve".   Would not want to go to a hospital now because she doesn't feel she needs to - makes numerous references to "getting through it" and other future oriented statements in conversation. Not open to support groups (nervous around people) but seemed a little more open to counseling. Very open to having chaplain visit.   Brief psychotic, manic screens (-).   ROS:  Incorporated into HPI  Collateral information:  Spoke to son Erin Jackson. He is generally worried about how his mom is coping with recent diagnoses. He is upset that he cannot be at the hospital as  much as he wants to. He doesn't think his mom is suicidal or an immediate threat to herself but was hoping that she could have someone to talk to while in the hospital and therapy after she leaves. Shares she has been more irritable lately  Psychiatric History:  Information collected from pt, medical record Incorporated into interview above - has been on sertraline  and quetiapine in the past.   Family psych history: brother with schizophrenia   Social History:  Lives with son (vs he lives with her).   Tobacco use: no Alcohol use: rare Drug use: sporadic marijuana  Family History:  The patient's family history includes Breast cancer in her maternal aunt and maternal grandmother.  Medical History: Past Medical History:  Diagnosis Date   Anxiety    Chronic back pain    Diabetes mellitus without complication (HCC)    GERD (gastroesophageal reflux disease)    Hypertension     Surgical History: Past Surgical History:  Procedure Laterality Date   ABDOMINAL HYSTERECTOMY     ARTERY BIOPSY Left 06/02/2022   Procedure: BIOPSY TEMPORAL ARTERY;  Surgeon: Maeola Harman, MD;  Location: [facility] OR;  Service: Vascular;  Laterality: Left;   BREAST LUMPECTOMY Left 03/25/2022   Procedure: LEFT BREAST LUMPECTOMY;  Surgeon: Griselda Miner, MD;  Location: [facility];  Service: General;  Laterality: Left;   BREAST REDUCTION SURGERY Bilateral 08/15/2018   Procedure: BILATERAL MAMMARY REDUCTION  (BREAST);  Surgeon: Louisa Second, MD;  Location: [facility];  Service: Plastics;  Laterality: Bilateral;  BREAST REDUCTION SURGERY Left 09/25/2019   Procedure: EXCISION OF FAT NECROSIS OF LEFT BREAST;  Surgeon: Louisa Second, MD;  Location: [facility];  Service: Plastics;  Laterality: Left;   CARPAL TUNNEL RELEASE Right 06/17/2020   right thumb CMC arthroplasty with tendon transfer and right carpal tunnel release.    CERVICAL SPINE SURGERY  2015   has a metal plate   FOOT SURGERY Left 2014   HAND SURGERY     LEFT HEART CATH AND CORONARY ANGIOGRAPHY N/A 04/09/2022   Procedure: LEFT HEART CATH AND CORONARY ANGIOGRAPHY;  Surgeon: Orpah Cobb, MD;  Location: [facility];  Service: Cardiovascular;  Laterality: N/A;   REDUCTION MAMMAPLASTY Bilateral 07/2018    Medications:   Current Facility-Administered  Medications:    acetaminophen (TYLENOL) tablet 650 mg, 650 mg, Oral, Q6H PRN, 650 mg at 06/04/22 1400 **OR** acetaminophen (TYLENOL) suppository 650 mg, 650 mg, Rectal, Q6H PRN, Eduard Clos, MD   allopurinol (ZYLOPRIM) tablet 100 mg, 100 mg, Oral, Daily, Midge Minium N, MD, 100 mg at 06/04/22 9381   ALPRAZolam (XANAX) tablet 2 mg, 2 mg, Oral, TID PRN, Raulkar, Drema Pry, MD, 2 mg at 06/04/22 1710   atorvastatin (LIPITOR) tablet 80 mg, 80 mg, Oral, Daily, Eduard Clos, MD, 80 mg at 06/04/22 8299   cloNIDine (CATAPRES) tablet 0.1 mg, 0.1 mg, Oral, TID, Pahwani, Ravi, MD, 0.1 mg at 06/04/22 1618   clopidogrel (PLAVIX) tablet 75 mg, 75 mg, Oral, Daily, Anwar, Shayan S, DO, 75 mg at 06/04/22 0924   enoxaparin (LOVENOX) injection 40 mg, 40 mg, Subcutaneous, Q24H, Scarlett Presto, RPH, 40 mg at 06/04/22 1700   escitalopram (LEXAPRO) tablet 5 mg, 5 mg, Oral, Daily, Anwar, Shayan S, DO, 5 mg at 06/04/22 3716   hydrALAZINE (APRESOLINE) injection 10 mg, 10 mg, Intravenous, Q6H PRN, Hughie Closs, MD, 10 mg at 06/04/22 1458   HYDROcodone-acetaminophen (NORCO) 10-325 MG per tablet 1 tablet, 1 tablet, Oral, TID PRN, Eduard Clos, MD, 1 tablet at 06/04/22 1618   hydrOXYzine (ATARAX) tablet 25 mg, 25 mg, Oral, TID, Eduard Clos, MD, 25 mg at 06/04/22 1618   insulin aspart (novoLOG) injection 0-20 Units, 0-20 Units, Subcutaneous, TID WC, Anwar, Shayan S, DO, 20 Units at 06/04/22 1700   insulin aspart (novoLOG) injection 0-5 Units, 0-5 Units, Subcutaneous, QHS, Anwar, Shayan S, DO   insulin glargine-yfgn (SEMGLEE) injection 15 Units, 15 Units, Subcutaneous, Daily, Lynwood Dawley S, DO, 15 Units at 06/04/22 1208   losartan (COZAAR) tablet 25 mg, 25 mg, Oral, Daily, Anwar, Shayan S, DO, 25 mg at 06/04/22 9678   metFORMIN (GLUCOPHAGE) tablet 1,000 mg, 1,000 mg, Oral, BID WC, Anwar, Shayan S, DO, 1,000 mg at 06/04/22 1618   Oral care mouth rinse, 15 mL, Mouth Rinse, PRN, Linna Darner,  Shayan S, DO   predniSONE (DELTASONE) tablet 60 mg, 60 mg, Oral, Q breakfast, Anwar, Shayan S, DO, 60 mg at 06/04/22 0758   traZODone (DESYREL) tablet 150 mg, 150 mg, Oral, QHS, Eduard Clos, MD, 150 mg at 06/03/22 2115  Allergies: Allergies  Allergen Reactions   Cucumber Extract Anaphylaxis   Molds & Smuts Shortness Of Breath   Norvasc [Amlodipine] Other (See Comments)    Muscle became very tight   Other Anaphylaxis and Other (See Comments)    Tree nuts, walnuts, peanuts, pickles   Apple Juice Hives   Aspirin Rash   Ibuprofen Rash   Niacin And Related Rash       Objective  Vital signs:  Temp:  [97.7 F (36.5 C)-98.6 F (37 C)] 98.1 F (36.7 C) (06/15 1111) Pulse Rate:  [89-139] 139 (06/15 1659) Resp:  [18-21] 18 (06/15 1111) BP: (130-183)/(63-126) 163/102 (06/15 1659) SpO2:  [94 %-100 %] 100 % (06/15 1659)  Psychiatric Specialty Exam:  Presentation  General Appearance: Appropriate for Environment Eye Contact:Fair (poor at beginning of interview, improved as built rapport) Speech:Clear and Coherent Speech Volume:Normal Handedness:No data recorded  Mood and Affect  Mood:Depressed Engineer, materials, afraid) Affect:Congruent  Thought Process  Thought Processes:Coherent Descriptions of Associations:Intact  Orientation:Full (Time, Place and Person)  Thought Content:Logical  History of Schizophrenia/Schizoaffective disorder:No data recorded Duration of Psychotic Symptoms:No data recorded Hallucinations:Hallucinations: None  Ideas of Reference:None  Suicidal Thoughts:Suicidal Thoughts: -- (does endorse brief passive SI yesterday)  Homicidal Thoughts:Homicidal Thoughts: No   Sensorium  Memory:Immediate Good; Recent Good; Remote Good Judgment:Fair Insight:Fair  Executive Functions  Concentration:Good Attention Span:Good [place] of Knowledge:Good Language:Good  Psychomotor Activity  Psychomotor Activity:Psychomotor Activity:  Normal  Assets  Assets:Communication Skills; Desire for Improvement; Housing  Sleep  Sleep:Sleep: Fair   Physical Exam: Physical Exam Constitutional:      Appearance: She is obese.  HENT:     Head: Normocephalic.     Comments: Wearing bonnet  Pulmonary:     Effort: Pulmonary effort is normal.  Neurological:     Mental Status: She is oriented to person, place, and time.    ROS Blood pressure (!) 163/102, pulse (!) 139, temperature 98.1 F (36.7 C), temperature source Oral, resp. rate 18, height 5\' 2"  (1.575 m), weight 90.7 kg, SpO2 100 %. Body mass index is 36.58 kg/m.

## 2022-06-04 NOTE — Progress Notes (Signed)
TRH night cross cover note:  I was notified by RN of patient's CBG check at 2100 revealing blood sugar of 316 at that time.  Per my chart review, including review of most recent rounding hospitalist progress note, this is a 56 year old female admitted with acute left vision loss suspected to be on the basis of temporal arteritis prompting initiation of prednisone.    In this setting, the patient has been running hyperglycemic, with blood sugars throughout the day noted to be in the range of 142 - 482.  She is on glargine 10 units SQ every morning and also has order for sliding scale NovoLog 3 times daily with meals.  Additionally, she received NovoLog 12 units subcu x1 around 1830 today.   I subsequently added nightly sliding scale coverage.      Newton Pigg, DO Hospitalist

## 2022-06-04 NOTE — NC FL2 (Signed)
[place] MEDICAID FL2 LEVEL OF CARE SCREENING TOOL     IDENTIFICATION  Patient Name: Erin Jackson Birthdate: 08-25-1966 Sex: female Admission Date (Current Location): 05/31/2022  [facility] and [place] Number:  Producer, television/film/video and Address:  [facility] [place]. [facility], [address], [place], [place] [address]      Provider Number: [id number]  Attending Physician Name and Address:  Verdia Kuba, DO  Relative Name and Phone Number:       Current Level of Care: Hospital Recommended Level of Care: Skilled Nursing Facility Prior Approval Number:    Date Approved/Denied:   PASRR Number: [id number]  Discharge Plan: SNF    Current Diagnoses: Patient Active Problem List   Diagnosis Date Noted   Vision loss 06/01/2022   Temporal arteritis (HCC) 06/01/2022   Embolic stroke (HCC) 04/29/2022   Stage 3b chronic kidney disease (HCC)    Chronic systolic heart failure (HCC)    Cerebral embolism with cerebral infarction 04/22/2022   Hypertensive urgency 04/21/2022   Altered mental status    Dizziness    Nausea    CAD (coronary artery disease) 04/10/2022   PVD (peripheral vascular disease) (HCC) 04/10/2022   Ischemic cardiomyopathy 04/10/2022   Acute systolic CHF (congestive heart failure) (HCC) 04/10/2022   Allergic reaction 04/08/2022   Angina at rest [facility]) 04/08/2022   Osteoarthritis of carpometacarpal (CMC) joint of right thumb 06/18/2020   Primary osteoarthritis of first carpometacarpal joint of right hand 11/09/2019   Right carpal tunnel syndrome 11/09/2019   De Quervain's tenosynovitis, right 11/09/2019   Arthritis of carpometacarpal (CMC) joints of both thumbs 11/04/2017   Chronic neck pain 08/20/2017   Supraclavicular mass 08/20/2017   Primary hypertension 08/20/2017   T2DM (type 2 diabetes mellitus) (HCC) 08/20/2017   GERD (gastroesophageal reflux disease) 08/20/2017   Cigarette nicotine dependence without complication 08/20/2017    Orientation  RESPIRATION BLADDER Height & Weight     Self, Time, Situation, Place  Normal Continent Weight: 200 lb (90.7 kg) Height:  5\' 2"  (157.5 cm)  BEHAVIORAL SYMPTOMS/MOOD NEUROLOGICAL BOWEL NUTRITION STATUS      Continent Diet (carb modified)  AMBULATORY STATUS COMMUNICATION OF NEEDS Skin   Limited Assist Verbally Normal                       Personal Care Assistance Level of Assistance  Bathing, Feeding, Dressing Bathing Assistance: Limited assistance Feeding assistance: Independent Dressing Assistance: Limited assistance     Functional Limitations Info  Sight Sight Info: Impaired (blind left eye)        SPECIAL CARE FACTORS FREQUENCY  PT (By licensed PT), OT (By licensed OT)     PT Frequency: 5x/wk OT Frequency: 5x/wk            Contractures Contractures Info: Not present    Additional Factors Info  Code Status, Allergies, Psychotropic, Insulin Sliding Scale Code Status Info: Full Allergies Info: Cucumber Extract, Molds & Smuts, Norvasc (Amlodipine), Other, Apple Juice, Aspirin, Ibuprofen, Niacin And Related Psychotropic Info: Lexapro 5mg  daily Insulin Sliding Scale Info: see DC summary       Current Medications (06/04/2022):  This is the current hospital active medication list Current Facility-Administered Medications  Medication Dose Route Frequency Provider Last Rate Last Admin   acetaminophen (TYLENOL) tablet 650 mg  650 mg Oral Q6H PRN , MD   650 mg at 06/04/22 1400   Or   acetaminophen (TYLENOL) suppository 650 mg  650 mg  Rectal Q6H PRN Eduard Clos, MD       allopurinol (ZYLOPRIM) tablet 100 mg  100 mg Oral Daily Eduard Clos, MD   100 mg at 06/04/22 3664   ALPRAZolam Prudy Feeler) tablet 2 mg  2 mg Oral TID PRN Horton Chin, MD   2 mg at 06/04/22 0803   atorvastatin (LIPITOR) tablet 80 mg  80 mg Oral Daily Eduard Clos, MD   80 mg at 06/04/22 4034   cloNIDine (CATAPRES) tablet 0.1 mg  0.1 mg Oral TID Hughie Closs, MD   0.1 mg at 06/04/22 7425   clopidogrel (PLAVIX) tablet 75 mg  75 mg Oral Daily Lynwood Dawley S, DO   75 mg at 06/04/22 0924   enoxaparin (LOVENOX) injection 40 mg  40 mg Subcutaneous Q24H Scarlett Presto, RPH   40 mg at 06/03/22 1747   escitalopram (LEXAPRO) tablet 5 mg  5 mg Oral Daily Lynwood Dawley S, DO   5 mg at 06/04/22 9563   hydrALAZINE (APRESOLINE) injection 10 mg  10 mg Intravenous Q6H PRN Hughie Closs, MD   10 mg at 06/04/22 1458   HYDROcodone-acetaminophen (NORCO) 10-325 MG per tablet 1 tablet  1 tablet Oral TID PRN Eduard Clos, MD   1 tablet at 06/04/22 0758   hydrOXYzine (ATARAX) tablet 25 mg  25 mg Oral TID Eduard Clos, MD   25 mg at 06/04/22 0923   insulin aspart (novoLOG) injection 0-20 Units  0-20 Units Subcutaneous TID WC Lynwood Dawley S, DO   3 Units at 06/04/22 1209   insulin aspart (novoLOG) injection 0-5 Units  0-5 Units Subcutaneous QHS Lynwood Dawley S, DO       insulin glargine-yfgn (SEMGLEE) injection 15 Units  15 Units Subcutaneous Daily Lynwood Dawley S, DO   15 Units at 06/04/22 1208   losartan (COZAAR) tablet 25 mg  25 mg Oral Daily Lynwood Dawley S, DO   25 mg at 06/04/22 8756   metFORMIN (GLUCOPHAGE) tablet 1,000 mg  1,000 mg Oral BID WC Verdia Kuba, DO       Oral care mouth rinse  15 mL Mouth Rinse PRN Lynwood Dawley S, DO       predniSONE (DELTASONE) tablet 60 mg  60 mg Oral Q breakfast Lynwood Dawley S, DO   60 mg at 06/04/22 0758   traZODone (DESYREL) tablet 150 mg  150 mg Oral QHS Eduard Clos, MD   150 mg at 06/03/22 2115     Discharge Medications: Please see discharge summary for a list of discharge medications.  Relevant Imaging Results:  Relevant Lab Results:   Additional Information SS#: [id number]  Baldemar Lenis, LCSW

## 2022-06-04 NOTE — Plan of Care (Signed)
Pt is alert oriented x 4. Pt ambulatory to bathroom standby assist with walker. Pt CBG was greater than 300, no coverage ordered. On call Provider paged to inform, received order for insulin. Pt verbalized concerns regarding her eye sight. Verbalized anxiety, prn xanax given per order, effective. No distress noted.    Problem: Education: Goal: Ability to describe self-care measures that may prevent or decrease complications (Diabetes Survival Skills Education) will improve Outcome: Progressing Goal: Individualized Educational Video(s) Outcome: Progressing   Problem: Coping: Goal: Ability to adjust to condition or change in health will improve Outcome: Progressing   Problem: Fluid Volume: Goal: Ability to maintain a balanced intake and output will improve Outcome: Progressing   Problem: Health Behavior/Discharge Planning: Goal: Ability to identify and utilize available resources and services will improve Outcome: Progressing Goal: Ability to manage health-related needs will improve Outcome: Progressing   Problem: Metabolic: Goal: Ability to maintain appropriate glucose levels will improve Outcome: Progressing   Problem: Nutritional: Goal: Maintenance of adequate nutrition will improve Outcome: Progressing Goal: Progress toward achieving an optimal weight will improve Outcome: Progressing   Problem: Skin Integrity: Goal: Risk for impaired skin integrity will decrease Outcome: Progressing   Problem: Tissue Perfusion: Goal: Adequacy of tissue perfusion will improve Outcome: Progressing   Problem: Education: Goal: Knowledge of General Education information will improve Description: Including pain rating scale, medication(s)/side effects and non-pharmacologic comfort measures Outcome: Progressing   Problem: Health Behavior/Discharge Planning: Goal: Ability to manage health-related needs will improve Outcome: Progressing   Problem: Clinical Measurements: Goal: Ability to  maintain clinical measurements within normal limits will improve Outcome: Progressing Goal: Will remain free from infection Outcome: Progressing Goal: Diagnostic test results will improve Outcome: Progressing Goal: Respiratory complications will improve Outcome: Progressing Goal: Cardiovascular complication will be avoided Outcome: Progressing   Problem: Activity: Goal: Risk for activity intolerance will decrease Outcome: Progressing   Problem: Nutrition: Goal: Adequate nutrition will be maintained Outcome: Progressing   Problem: Coping: Goal: Level of anxiety will decrease Outcome: Progressing   Problem: Elimination: Goal: Will not experience complications related to bowel motility Outcome: Progressing Goal: Will not experience complications related to urinary retention Outcome: Progressing   Problem: Pain Managment: Goal: General experience of comfort will improve Outcome: Progressing   Problem: Safety: Goal: Ability to remain free from injury will improve Outcome: Progressing   Problem: Skin Integrity: Goal: Risk for impaired skin integrity will decrease Outcome: Progressing

## 2022-06-04 NOTE — Progress Notes (Signed)
Physical Therapy Treatment Patient Details Name: Erin Jackson MRN: [id number] DOB: [birth year] Today's Date: 06/04/2022   History of Present Illness Pt is a 56 y/o F presenting on 6/11 with sudden loss of vision, L sided headache.  MRI concerning for temporal arteritis.  S/P temporal artery biopsy 6/13. Noted recent admission for CVA last month. PMH of HTN, DM, Anxiety, GERD, and recent heart cath on 4/20.    PT Comments    Patient was agreeable to PT session. She now has a 1:1 sitter in the room. She is making progress with functional independence and increased activity tolerance. She can stand without physical assistance. Ambulation distance increased this session with no reported dizziness with upright activity. Safety awareness has improved and less cues required for strategies to accommodate for vision impairments during functional standing tasks and ambulation. The patient still requests to go to inpatient rehab at discharge. She does not feel that she has adequate support to return home at this time.    Recommendations for follow up therapy are one component of a multi-disciplinary discharge planning process, led by the attending physician.  Recommendations may be updated based on patient status, additional functional criteria and insurance authorization.  Follow Up Recommendations  Acute inpatient rehab (3hours/day)     Assistance Recommended at Discharge Intermittent Supervision/Assistance  Patient can return home with the following A little help with walking and/or transfers;A little help with bathing/dressing/bathroom;Help with stairs or ramp for entrance;Assist for transportation;Assistance with cooking/housework   Equipment Recommendations  Rollator (4 wheels) (patient recommending rollator for impaired endurance)    Recommendations for Other Services       Precautions / Restrictions Precautions Precautions: Fall Restrictions Weight Bearing Restrictions: No      Mobility  Bed Mobility               General bed mobility comments: not assessed as patient sitting up on arrival and post session    Transfers Overall transfer level: Needs assistance Equipment used: Rolling walker (2 wheels) Transfers: Sit to/from Stand Sit to Stand: Supervision           General transfer comment: occasional cues for safety provided. overall, patient demonstrated good safety awareness with transfers. no dizziness is reported with standing    Ambulation/Gait Ambulation/Gait assistance: Min guard Gait Distance (Feet): 110 Feet Assistive device: Rolling walker (2 wheels) Gait Pattern/deviations: Decreased stride length Gait velocity: decreased     General Gait Details: patient with increased gait distance this session. no dizziness is reported with mobility. patient does reports ongoing left foot numbness unchanged since stroke. she demonstrated increased self awareness with vision deficits with increased scanning to compensate for vison loss with minimal cues this session. she avoided obstacles in the room on left side with minimal cues.   Stairs             Wheelchair Mobility    Modified Rankin (Stroke Patients Only)       Balance Overall balance assessment: Needs assistance Sitting-balance support: Feet supported Sitting balance-Leahy Scale: Good     Standing balance support: Bilateral upper extremity supported, Reliant on assistive device for balance Standing balance-Leahy Scale: Fair Standing balance comment: with UE supported on rolling walker, no external support provided                            Cognition Arousal/Alertness: Awake/alert Behavior During Therapy: Flat affect Overall Cognitive Status: Within Functional Limits for tasks assessed  General Comments: patient expressed furstrations with medical issues. she is flat but more interactive than yesterday         Exercises General Exercises - Lower Extremity Ankle Circles/Pumps: AROM, Strengthening, Left, 10 reps, Seated Long Arc Quad: AROM, Strengthening, Both, 10 reps, Seated Other Exercises Other Exercises: mild edema noted distally on BLE (right more than left). encouraged ankle pumps and to elevated LE periodially through the day for edema management. she prefers to sit up for most of the day with legs in a dependent position.    General Comments General comments (skin integrity, edema, etc.): patient continues to report that she has no help at home with IADLs, etc, and wants to speak with the MD from inpatient rehab.      Pertinent Vitals/Pain Pain Assessment Pain Assessment: Faces Faces Pain Scale: Hurts a little bit Pain Location: left foot Pain Descriptors / Indicators: Pins and needles Pain Intervention(s): Limited activity within patient's tolerance, Premedicated before session    Home Living                          Prior Function            PT Goals (current goals can now be found in the care plan section) Acute Rehab PT Goals Patient Stated Goal: to be independent, have more help at home PT Goal Formulation: With patient Time For Goal Achievement: 06/10/22 Potential to Achieve Goals: Good Progress towards PT goals: Progressing toward goals    Frequency    Min 3X/week      PT Plan Current plan remains appropriate    Co-evaluation              AM-PAC PT "6 Clicks" Mobility   Outcome Measure  Help needed turning from your back to your side while in a flat bed without using bedrails?: None Help needed moving from lying on your back to sitting on the side of a flat bed without using bedrails?: A Little Help needed moving to and from a bed to a chair (including a wheelchair)?: A Little Help needed standing up from a chair using your arms (e.g., wheelchair or bedside chair)?: A Little Help needed to walk in hospital room?: A Little Help needed  climbing 3-5 steps with a railing? : A Little 6 Click Score: 19    End of Session Equipment Utilized During Treatment: Gait belt Activity Tolerance: Patient tolerated treatment well Patient left: with nursing/sitter in room (sitting on edge of bed; 1:1 sitter in the room)   PT Visit Diagnosis: Muscle weakness (generalized) (M62.81);Other abnormalities of gait and mobility (R26.89)     Time: 2423-5361 PT Time Calculation (min) (ACUTE ONLY): 21 min  Charges:  $Gait Training: 8-22 mins                     Donna Bernard, PT, MPT    Ina Homes 06/04/2022, 2:31 PM

## 2022-06-04 NOTE — Care Management Important Message (Signed)
Important Message  Patient Details  Name: Erin Jackson MRN: [id number] Date of Birth: [birth year]   Medicare Important Message Given:  Yes     Dorena Bodo 06/04/2022, 2:03 PM

## 2022-06-05 DIAGNOSIS — H547 Unspecified visual loss: Secondary | ICD-10-CM | POA: Diagnosis not present

## 2022-06-05 LAB — CBC WITH DIFFERENTIAL/PLATELET
Abs Immature Granulocytes: 0.02 10*3/uL (ref 0.00–0.07)
Basophils Absolute: 0 10*3/uL (ref 0.0–0.1)
Basophils Relative: 0 %
Eosinophils Absolute: 0 10*3/uL (ref 0.0–0.5)
Eosinophils Relative: 1 %
HCT: 30.4 % — ABNORMAL LOW (ref 36.0–46.0)
Hemoglobin: 10 g/dL — ABNORMAL LOW (ref 12.0–15.0)
Immature Granulocytes: 0 %
Lymphocytes Relative: 41 %
Lymphs Abs: 3.6 10*3/uL (ref 0.7–4.0)
MCH: 28.8 pg (ref 26.0–34.0)
MCHC: 32.9 g/dL (ref 30.0–36.0)
MCV: 87.6 fL (ref 80.0–100.0)
Monocytes Absolute: 0.6 10*3/uL (ref 0.1–1.0)
Monocytes Relative: 7 %
Neutro Abs: 4.6 10*3/uL (ref 1.7–7.7)
Neutrophils Relative %: 51 %
Platelets: 214 10*3/uL (ref 150–400)
RBC: 3.47 MIL/uL — ABNORMAL LOW (ref 3.87–5.11)
RDW: 13.7 % (ref 11.5–15.5)
WBC: 8.9 10*3/uL (ref 4.0–10.5)
nRBC: 0 % (ref 0.0–0.2)

## 2022-06-05 LAB — BASIC METABOLIC PANEL
Anion gap: 9 (ref 5–15)
BUN: 25 mg/dL — ABNORMAL HIGH (ref 6–20)
CO2: 22 mmol/L (ref 22–32)
Calcium: 7.4 mg/dL — ABNORMAL LOW (ref 8.9–10.3)
Chloride: 106 mmol/L (ref 98–111)
Creatinine, Ser: 1.25 mg/dL — ABNORMAL HIGH (ref 0.44–1.00)
GFR, Estimated: 50 mL/min — ABNORMAL LOW (ref >60)
Glucose, Bld: 141 mg/dL — ABNORMAL HIGH (ref 70–99)
Potassium: 4.2 mmol/L (ref 3.5–5.1)
Sodium: 137 mmol/L (ref 135–145)

## 2022-06-05 LAB — GLUCOSE, CAPILLARY
Glucose-Capillary: 110 mg/dL — ABNORMAL HIGH (ref 70–99)
Glucose-Capillary: 211 mg/dL — ABNORMAL HIGH (ref 70–99)
Glucose-Capillary: 326 mg/dL — ABNORMAL HIGH (ref 70–99)
Glucose-Capillary: 199 mg/dL — ABNORMAL HIGH (ref 70–99)

## 2022-06-05 LAB — VITAMIN B12: Vitamin B-12: 626 pg/mL (ref 180–914)

## 2022-06-05 MED ORDER — INSULIN GLARGINE-YFGN 100 UNIT/ML ~~LOC~~ SOLN
20.0000 [IU] | Freq: Every day | SUBCUTANEOUS | Status: DC
  Administered 2022-06-05 – 2022-06-06 (×2): 20 [IU] via SUBCUTANEOUS
  Filled 2022-06-05 (×3): qty 0.2

## 2022-06-05 MED ORDER — LOSARTAN POTASSIUM 50 MG PO TABS
100.0000 mg | ORAL_TABLET | Freq: Every day | ORAL | Status: DC
  Administered 2022-06-06 – 2022-06-08 (×3): 100 mg via ORAL
  Filled 2022-06-05 (×3): qty 2

## 2022-06-05 MED ORDER — CLONIDINE HCL 0.1 MG PO TABS
0.3000 mg | ORAL_TABLET | Freq: Three times a day (TID) | ORAL | Status: DC
  Administered 2022-06-05 – 2022-06-08 (×8): 0.3 mg via ORAL
  Filled 2022-06-05 (×8): qty 3

## 2022-06-05 MED ORDER — CLONIDINE HCL 0.1 MG PO TABS
0.2000 mg | ORAL_TABLET | Freq: Three times a day (TID) | ORAL | Status: DC
  Administered 2022-06-05: 0.2 mg via ORAL
  Filled 2022-06-05: qty 2

## 2022-06-05 MED ORDER — METOPROLOL TARTRATE 5 MG/5ML IV SOLN
2.5000 mg | Freq: Four times a day (QID) | INTRAVENOUS | Status: DC | PRN
  Administered 2022-06-05: 2.5 mg via INTRAVENOUS
  Filled 2022-06-05: qty 5

## 2022-06-05 MED ORDER — PANTOPRAZOLE SODIUM 40 MG PO TBEC
80.0000 mg | DELAYED_RELEASE_TABLET | Freq: Every day | ORAL | Status: DC
  Administered 2022-06-05 – 2022-06-08 (×4): 80 mg via ORAL
  Filled 2022-06-05 (×4): qty 2

## 2022-06-05 MED ORDER — LIVING WELL WITH DIABETES BOOK
Freq: Once | Status: AC
  Filled 2022-06-05: qty 1

## 2022-06-05 MED ORDER — INSULIN STARTER KIT- PEN NEEDLES (ENGLISH)
1.0000 | Freq: Once | Status: AC
Start: 2022-06-05 — End: 2022-06-08
  Administered 2022-06-08: 1
  Filled 2022-06-05: qty 1

## 2022-06-05 NOTE — Consult Note (Signed)
[facility] New Face-to-Face Psychiatric Evaluation   Service Date: June 05, 2022 LOS:  LOS: 4 days    Assessment  Erin Jackson is a 56 y.o. female admitted medically for 05/31/2022  1:05 PM for likely temporal arteritis. She carries the psychiatric diagnoses of bipolar disorder and has a past medical history of  recent stroke, back pain, DMII, GERD and HTN. Psychiatry was consulted for suicidal statements in text to son by [name].    Her current presentation of sending concerning texts to her son is most consistent with adjustment disorder with depressed mood. On review, these text messages were clearly sent to address a perceived lack of support from her son - she did not threaten suicide in these messages but did reference her own death. She does endorse years of low mood, motivation, isolation consistent with MDD/recurrent/severe - this episode seems to have started about 2 years ago. Although she was previously diagnosed with bipolar disorder she denied any history of mania; feel this is likely erroneous - furthermore no psychiatric hospitalizations since 2005 argue against this diagnosis. She has no desire to go to an inpatient psychiatric unit and does not meet criteria for IVC.   Current outpatient psychotropic medications include alprazolam 2 mg TID and historically she has had a good response to these medications - although generally disagree with chronic BZD use tapering requires pt buy-in and is best done as an outpt. Escitalopram was started by the primary team prior to psych consult - would continue this.. On initial examination, patient expressed her struggles with diagnosis but and concerns about the future but denied active suicidal ideations and demonstrated future-oriented thought throughout conversation. Please see plan below for detailed recommendations.   6/16: agree with initial plan. No need for sitter, psych hospital. Would benefit from [facility] prior to dc  for counselling resources (have put some in dc summary). Requesting to be seen again Monday if still hospitalized.   Diagnoses:  Active Hospital problems: Principal Problem:   Vision loss Active Problems:   T2DM (type 2 diabetes mellitus) ([place])   CAD (coronary artery disease)   Stage 3b chronic kidney disease ([place])   Chronic systolic heart failure (H[facility])   Temporal arteritis ([place])     Plan  ## Safety and Observation Level:  - Based on my clinical evaluation, I estimate the patient to be at low risk of self harm in the current setting - At this time, we recommend a routine level of observation. This decision is based on my review of the chart including patient's history and current presentation, interview of the patient, mental status examination, and consideration of suicide risk including evaluating suicidal ideation, plan, intent, suicidal or self-harm behaviors, risk factors, and protective factors. This judgment is based on our ability to directly address suicide risk, implement suicide prevention strategies and develop a safety plan while the patient is in the clinical setting. Please contact our team if there is a concern that risk level has changed.   ## Medications:  -- Continue escitalopram and alprazolam per primary team  ## Medical Decision Making Capacity:  Not formally assessed  ## Further Work-up:  -- B12 wnl (ordered 6/15)   - recent TSH, vit D WNL  -- most recent EKG on 6/11 had QtC of 482 (making escitalopram good choice) -- Pertinent labwork reviewed earlier this admission includes: EtOH <10   ## Misc:  -- continue chaplain consult  ## Disposition:  -- per primary -- TOC prior  to dc for f/u counselling    Thank you for this consult request. Recommendations have been communicated to the primary team.  We will continue to follow at this time.   Bud A Melony Tenpas   New history  Relevant Aspects of Hospital Course:  Admitted on 05/31/2022 for likely  temporal arteritis; had recent admission for stroke.  Patient Report:  Patient seen briefly while with chaplain and again in afternoon. Continues to endorse significant life stressors and feeling isolated - feels like she has given support throughout her life but is not getting support in return. Does talk a little bit more about some supports (friends, older son) in [place] - thinking about moving back. Overall remains very future oriented (both when asking about ideation and in narrative). Discussed benefit of human connection at length and pt agreeable to seeing someone for therapy after discharge - also agreeable to continue lexapro when discussed it is a "gentler medication" than previously prescribed antipsychotics (and currently prescribed alprazolam). She is frustrated that there is still no answer for sudden blindness - temporal artery biopsy was (-). No SI, HI, AH/VH.   ROS:  Ongoing loss of vision in eye.  Collateral information:  Spoke to son Erin Jackson. He is generally worried about how his mom is coping with recent diagnoses. He is upset that he cannot be at the hospital as  much as he wants to. He doesn't think his mom is suicidal or an immediate threat to herself but was hoping that she could have someone to talk to while in the hospital and therapy after she leaves. Shares she has been more irritable lately  Psychiatric History:  Information collected from pt, medical record Incorporated into interview above - has been on sertraline and quetiapine in the past.   Family psych history: brother with schizophrenia   Social History:  Lives with son (vs he lives with her).   Tobacco use: no Alcohol use: rare Drug use: sporadic marijuana  Family History:  The patient's family history includes Breast cancer in her maternal aunt and maternal grandmother.  Medical History: Past Medical History:  Diagnosis Date   Anxiety    Chronic back pain    Diabetes mellitus without  complication ([place])    GERD (gastroesophageal reflux disease)    Hypertension     Surgical History: Past Surgical History:  Procedure Laterality Date   ABDOMINAL HYSTERECTOMY     ARTERY BIOPSY Left 06/02/2022   Procedure: BIOPSY TEMPORAL ARTERY;  Surgeon: Waynetta Sandy, MD;  Location: [facility];  Service: Vascular;  Laterality: Left;   BREAST LUMPECTOMY Left 03/25/2022   Procedure: LEFT BREAST LUMPECTOMY;  Surgeon: Jovita Kussmaul, MD;  Location: [facility];  Service: General;  Laterality: Left;   BREAST REDUCTION SURGERY Bilateral 08/15/2018   Procedure: BILATERAL MAMMARY REDUCTION  (BREAST);  Surgeon: Cristine Polio, MD;  Location: [facility];  Service: Plastics;  Laterality: Bilateral;   BREAST REDUCTION SURGERY Left 09/25/2019   Procedure: EXCISION OF FAT NECROSIS OF LEFT BREAST;  Surgeon: Cristine Polio, MD;  Location: [facility];  Service: Plastics;  Laterality: Left;   CARPAL TUNNEL RELEASE Right 06/17/2020   right thumb CMC arthroplasty with tendon transfer and right carpal tunnel release.    CERVICAL SPINE SURGERY  2015   has a metal plate   FOOT SURGERY Left 2014   HAND SURGERY     LEFT HEART CATH AND CORONARY ANGIOGRAPHY N/A 04/09/2022   Procedure: LEFT HEART CATH AND  CORONARY ANGIOGRAPHY;  Surgeon: Dixie Dials, MD;  Location: [facility];  Service: Cardiovascular;  Laterality: N/A;   REDUCTION MAMMAPLASTY Bilateral 07/2018    Medications:   Current Facility-Administered Medications:    acetaminophen (TYLENOL) tablet 650 mg, 650 mg, Oral, Q6H PRN, 650 mg at 06/05/22 1214 **OR** acetaminophen (TYLENOL) suppository 650 mg, 650 mg, Rectal, Q6H PRN, Rise Patience, MD   allopurinol (ZYLOPRIM) tablet 100 mg, 100 mg, Oral, Daily, Hal Hope, Arshad N, MD, 100 mg at 06/05/22 0815   ALPRAZolam (XANAX) tablet 2 mg, 2 mg, Oral, TID PRN, Raulkar, Clide Deutscher, MD, 2 mg at 06/05/22 0815   atorvastatin (LIPITOR) tablet 80  mg, 80 mg, Oral, Daily, Rise Patience, MD, 80 mg at 06/05/22 0815   cloNIDine (CATAPRES) tablet 0.2 mg, 0.2 mg, Oral, TID, Rowe Pavy, Shayan S, DO   clopidogrel (PLAVIX) tablet 75 mg, 75 mg, Oral, Daily, Anwar, Shayan S, DO, 75 mg at 06/05/22 0815   enoxaparin (LOVENOX) injection 40 mg, 40 mg, Subcutaneous, Q24H, Skeet Simmer, RPH, 40 mg at 06/04/22 1700   escitalopram (LEXAPRO) tablet 5 mg, 5 mg, Oral, Daily, Anwar, Shayan S, DO, 5 mg at 06/05/22 0815   hydrALAZINE (APRESOLINE) injection 10 mg, 10 mg, Intravenous, Q6H PRN, Doristine Bosworth, Ravi, MD, 10 mg at 06/05/22 1214   HYDROcodone-acetaminophen ([place]) 10-325 MG per tablet 1 tablet, 1 tablet, Oral, TID PRN, Rise Patience, MD, 1 tablet at 06/05/22 0815   hydrOXYzine (ATARAX) tablet 25 mg, 25 mg, Oral, TID, Rise Patience, MD, 25 mg at 06/05/22 0815   insulin aspart (novoLOG) injection 0-20 Units, 0-20 Units, Subcutaneous, TID WC, Anwar, Shayan S, DO, 7 Units at 06/05/22 1214   insulin aspart (novoLOG) injection 0-5 Units, 0-5 Units, Subcutaneous, QHS, Anwar, Shayan S, DO, 5 Units at 06/04/22 2103   insulin glargine-yfgn (SEMGLEE) injection 15 Units, 15 Units, Subcutaneous, Daily, [name] S, DO, 15 Units at 06/05/22 0816   insulin starter kit- pen needles (English) 1 kit, 1 kit, Other, Once, [place], Velva S, DO   living well with diabetes book MISC, , Does not apply, Once, Anwar, Shayan S, DO   losartan (COZAAR) tablet 25 mg, 25 mg, Oral, Daily, Anwar, Shayan S, DO, 25 mg at 06/05/22 0815   metFORMIN (GLUCOPHAGE) tablet 1,000 mg, 1,000 mg, Oral, BID WC, Anwar, Shayan S, DO, 1,000 mg at 06/05/22 0815   metoprolol tartrate (LOPRESSOR) injection 2.5 mg, 2.5 mg, Intravenous, Q6H PRN, [name] S, DO, 2.5 mg at 06/05/22 0816   Oral care mouth rinse, 15 mL, Mouth Rinse, PRN, Rowe Pavy, Shayan S, DO   pantoprazole (PROTONIX) EC tablet 80 mg, 80 mg, Oral, Daily, Anwar, Shayan S, DO, 80 mg at 06/05/22 1214   predniSONE (DELTASONE)  tablet 60 mg, 60 mg, Oral, Q breakfast, Anwar, Shayan S, DO, 60 mg at 06/05/22 0815   traZODone (DESYREL) tablet 150 mg, 150 mg, Oral, QHS, Rise Patience, MD, 150 mg at 06/04/22 2101  Allergies: Allergies  Allergen Reactions   Cucumber Extract Anaphylaxis   Molds & Smuts Shortness Of Breath   Norvasc [Amlodipine] Other (See Comments)    Muscle became very tight   Other Anaphylaxis and Other (See Comments)    Tree nuts, walnuts, peanuts, pickles   Apple Juice Hives   Aspirin Rash   Ibuprofen Rash   Niacin And Related Rash       Objective  Vital signs:  Temp:  [97.6 F (36.4 C)-99.3 F (37.4 C)] 98.9 F (37.2 C) (  06/16 1156) Pulse Rate:  [98-139] 116 (06/16 1156) Resp:  [18-20] 18 (06/16 1156) BP: (129-183)/(87-126) 154/111 (06/16 1156) SpO2:  [98 %-100 %] 100 % (06/16 1156)  Psychiatric Specialty Exam:  Presentation  General Appearance: Appropriate for Environment Eye Contact:Good Speech:Clear and Coherent Speech Volume:Normal Handedness:No data recorded  Mood and Affect  Mood:Depressed Affect:Congruent  Thought Process  Thought Processes:Coherent Descriptions of Associations:Intact  Orientation:Full (Time, Place and Person)  Thought Content:Logical  History of Schizophrenia/Schizoaffective disorder:No data recorded Duration of Psychotic Symptoms:No data recorded Hallucinations:Hallucinations: None  Ideas of Reference:None  Suicidal Thoughts:Suicidal Thoughts: No  Homicidal Thoughts:Homicidal Thoughts: No   Sensorium  Memory:Immediate Good; Recent Good; Remote Good Judgment:Fair Insight:Fair  Executive Functions  Concentration:Good Attention Span:Good [place] of Knowledge:Good Language:Good  Psychomotor Activity  Psychomotor Activity:Psychomotor Activity: Normal  Assets  Assets:Communication Skills; Desire for Improvement  Sleep  Sleep:Sleep: Fair   Physical Exam: Physical Exam Constitutional:      Appearance: She  is obese.  HENT:     Head: Normocephalic.     Comments: Wearing bonnet  Pulmonary:     Effort: Pulmonary effort is normal.  Neurological:     Mental Status: She is oriented to person, place, and time.     Blood pressure (!) 154/111, pulse (!) 116, temperature 98.9 F (37.2 C), temperature source Oral, resp. rate 18, height '5\' 2"'  (1.575 m), weight 90.7 kg, SpO2 100 %. Body mass index is 36.58 kg/m.

## 2022-06-05 NOTE — Progress Notes (Signed)
PROGRESS NOTE    Erin Jackson  YKZ:[id number] DOB: 1966-08-20 DOA: 05/31/2022 PCP: Merryl Hacker, No   Brief Narrative:  Erin Jackson is a 56 y.o. female with history of recent admission for stroke last month, diabetes mellitus, hypertension, chronic kidney disease stage II, chronic pain, peripheral vascular disease presented to the ER after patient had persistent loss of vision since waking up in the morning on May 30, 2022.  No other complaint.   In the ER patient had MRI brain and MRI orbits CT angiogram of head and neck.  Neurology evaluated the patient at this time patient's symptoms are concerning for temporal arteritis started on prednisone admitted for further work-up.  ESR elevated.  Vascular consulted, temporal artery biopsy done. Pathology negative  Assessment & Plan:   Sudden loss of vision left eye/possible temporal arteritis: Patient presents with sudden loss of vision, left-sided headache and has tenderness on the left temporal area with ESR over 50 and she is 56 as well.  She meets criteria for temporal arteritis.  She has been seen by neurology.  Has been started on prednisone in an effort to reverse her vision loss however her vision has not improved. Likely unrecoverable. Vascular was consulted and patient had temporal artery biopsy. CIR evaluated and not a candidate. Prednisone 59m QD for 3-5 months. She will need outpatient follow up with Rheumatology and Opthamology. Discussed with Optho Dr. GTobe Sos No acute management inpatient. Biopsy results could be false negative especially in the context of getting empiric steroids prior to biopsy.  Recent ischemic stroke: No focal deficit.  Continue statin and Plavix  Hypertensive urgency: uncontrolled. Clonidine home dose increased. Started losartan during hospitalization  Anxiety: Continue home Xanax. Started low dose SSRI: Lexapro. Feels depressed as would be expected with new onset vision loss. Psych consulted  Diabetes mellitus  type 2: Last hemoglobin A1c was 6.8 just last month.  She takes metformin at home.  Having significant steroid induced hyperglycemia. Now on long acting insulin and SSI. Increased home metformin dose. Will need to discharge on insulin given she will be on prednisone for several months going forward.  CKD stage IIIA: At baseline.  Monitor.  DVT prophylaxis: SCDs   Code Status: Full Code  Family Communication: No family present at bedside.  Status is: Inpatient Remains inpatient appropriate because: Placement   Consultants:  Neurology Vascular surgery Procedures:  None  Antimicrobials:  Anti-infectives (From admission, onward)    Start     Dose/Rate Route Frequency Ordered Stop   06/02/22 0600  cefUROXime (ZINACEF) 1.5 g in sodium chloride 0.9 % 100 mL IVPB        1.5 g 200 mL/hr over 30 Minutes Intravenous On call to O.R. 06/01/22 0939006/13/23 1300         Subjective:  Seen and examined.  No new complaint.  No improvement in the vision.  Feeling depressed but denies SI.  Objective: Vitals:   06/05/22 0156 06/05/22 0758 06/05/22 1156 06/05/22 1604  BP: 129/87 (!) 155/114 (!) 154/111 (!) 174/90  Pulse: 98 (!) 138 (!) 116 (!) 125  Resp: _0 Temp: 99.3 F (37.4 C)  98.9 F (37.2 C) 99.1 F (37.3 C)  TempSrc: Axillary  Oral Oral  SpO2: 100% 100% 100% 99%  Weight:      Height:        Intake/Output Summary (Last 24 hours) at 06/05/2022 1647 Last data filed at 06/05/2022 1030 Gross per 24 hour  Intake 820 ml  Output --  Net 820 ml    Filed Weights   06/02/22 1101  Weight: 90.7 kg    Examination:  General exam: Appears calm and comfortable  Respiratory system: Clear to auscultation. Respiratory effort normal. Cardiovascular system: S1 & S2 heard, RRR. No JVD, murmurs, rubs, gallops or clicks. No pedal edema. Gastrointestinal system: Abdomen is nondistended, soft and nontender. No organomegaly or masses felt. Normal bowel sounds heard. Central nervous  system: Alert and oriented. No focal neurological deficits.  Extremities: Symmetric 5 x 5 power. 1+ pedal edema Skin: No rashes, lesions or ulcers.  Psychiatry: Flat affect  Data Reviewed: I have personally reviewed following labs and imaging studies  CBC: Recent Labs  Lab 05/31/22 1410 06/02/22 0453 06/05/22 0210  WBC 6.6 8.6 8.9  NEUTROABS 2.9  --  4.6  HGB 10.9* 10.5* 10.0*  HCT 34.6* 32.6* 30.4*  MCV 92.0 87.2 87.6  PLT 266 249 989    Basic Metabolic Panel: Recent Labs  Lab 05/31/22 1410 06/02/22 0453 06/05/22 0210  NA 138 138 137  K 3.9 3.4* 4.2  CL 107 105 106  CO2 20* 24 22  GLUCOSE 140* 166* 141*  BUN 20 25* 25*  CREATININE 1.24* 1.28* 1.25*  CALCIUM 7.1* 7.4* 7.4*    GFR: Estimated Creatinine Clearance: 51.3 mL/min (A) (by C-G formula based on SCr of 1.25 mg/dL (H)). Liver Function Tests: Recent Labs  Lab 05/31/22 1410  AST 21  ALT 10  ALKPHOS 72  BILITOT 0.4  PROT 6.5  ALBUMIN 2.9*    No results for input(s): "LIPASE", "AMYLASE" in the last 168 hours. No results for input(s): "AMMONIA" in the last 168 hours. Coagulation Profile: Recent Labs  Lab 06/01/22 0120  INR 1.2    Cardiac Enzymes: No results for input(s): "CKTOTAL", "CKMB", "CKMBINDEX", "TROPONINI" in the last 168 hours. BNP (last 3 results) No results for input(s): "PROBNP" in the last 8760 hours. HbA1C: No results for input(s): "HGBA1C" in the last 72 hours. CBG: Recent Labs  Lab 06/04/22 1602 06/04/22 2026 06/05/22 0615 06/05/22 1155 06/05/22 1615  GLUCAP 454* 369* 110* 211* 326*    Lipid Profile: No results for input(s): "CHOL", "HDL", "LDLCALC", "TRIG", "CHOLHDL", "LDLDIRECT" in the last 72 hours. Thyroid Function Tests: No results for input(s): "TSH", "T4TOTAL", "FREET4", "T3FREE", "THYROIDAB" in the last 72 hours. Anemia Panel: Recent Labs    06/05/22 0210  VITAMINB12 626   Sepsis Labs: No results for input(s): "PROCALCITON", "LATICACIDVEN" in the last 168  hours.  No results found for this or any previous visit (from the past 240 hour(s)).   Radiology Studies: No results found.  Scheduled Meds:  allopurinol  100 mg Oral Daily   atorvastatin  80 mg Oral Daily   cloNIDine  0.2 mg Oral TID   clopidogrel  75 mg Oral Daily   enoxaparin (LOVENOX) injection  40 mg Subcutaneous Q24H   escitalopram  5 mg Oral Daily   hydrOXYzine  25 mg Oral TID   insulin aspart  0-20 Units Subcutaneous TID WC   insulin aspart  0-5 Units Subcutaneous QHS   insulin glargine-yfgn  15 Units Subcutaneous Daily   insulin starter kit- pen needles  1 kit Other Once   living well with diabetes book   Does not apply Once   losartan  25 mg Oral Daily   metFORMIN  1,000 mg Oral BID WC   pantoprazole  80 mg Oral Daily   predniSONE  60 mg Oral Q breakfast   traZODone  150 mg Oral QHS       LOS: 4 days   Leslee Home, MD [facility]  06/05/2022, 4:47 PM   *Please note that this is a verbal dictation therefore any spelling or grammatical errors are due to the "[place] [facility]" system interpretation.  Please page via [place] and do not message via secure chat for urgent patient care matters. Secure chat can be used for non urgent patient care matters.  How to contact the [facility] Attending or Consulting provider [place] or covering provider during after hours [place], for this patient?  Check the care team in [facility] and look for a) attending/consulting TR[facility] provider listed and b) the [facility] team listed. Page or secure chat [phone]. Log into [email or web address] and use [place]'s universal password to access. If you do not have the password, please contact the hospital operator. Locate the [facility] provider you are looking for under [facility] and page to a number that you can be directly reached. If you still have difficulty reaching the provider, please page the [facility] (Director on Call) for the Hospitalists listed on amion for assistance.

## 2022-06-05 NOTE — Plan of Care (Signed)
Pt is alert oriented x 4. Pt is ambulatory with walker. Left eye deficit. Right eye blurry. Pt c/o headache, prn medication given with effective results. BP was elevated x 1, prn hydralazine given per order. HR has increased this am.    Problem: Education: Goal: Ability to describe self-care measures that may prevent or decrease complications (Diabetes Survival Skills Education) will improve Outcome: Progressing Goal: Individualized Educational Video(s) Outcome: Progressing   Problem: Coping: Goal: Ability to adjust to condition or change in health will improve Outcome: Progressing   Problem: Fluid Volume: Goal: Ability to maintain a balanced intake and output will improve Outcome: Progressing   Problem: Health Behavior/Discharge Planning: Goal: Ability to identify and utilize available resources and services will improve Outcome: Progressing Goal: Ability to manage health-related needs will improve Outcome: Progressing   Problem: Metabolic: Goal: Ability to maintain appropriate glucose levels will improve Outcome: Progressing   Problem: Nutritional: Goal: Maintenance of adequate nutrition will improve Outcome: Progressing Goal: Progress toward achieving an optimal weight will improve Outcome: Progressing   Problem: Skin Integrity: Goal: Risk for impaired skin integrity will decrease Outcome: Progressing   Problem: Tissue Perfusion: Goal: Adequacy of tissue perfusion will improve Outcome: Progressing   Problem: Education: Goal: Knowledge of General Education information will improve Description: Including pain rating scale, medication(s)/side effects and non-pharmacologic comfort measures Outcome: Progressing   Problem: Health Behavior/Discharge Planning: Goal: Ability to manage health-related needs will improve Outcome: Progressing   Problem: Clinical Measurements: Goal: Ability to maintain clinical measurements within normal limits will improve Outcome:  Progressing Goal: Will remain free from infection Outcome: Progressing Goal: Diagnostic test results will improve Outcome: Progressing Goal: Respiratory complications will improve Outcome: Progressing Goal: Cardiovascular complication will be avoided Outcome: Progressing   Problem: Activity: Goal: Risk for activity intolerance will decrease Outcome: Progressing   Problem: Nutrition: Goal: Adequate nutrition will be maintained Outcome: Progressing   Problem: Coping: Goal: Level of anxiety will decrease Outcome: Progressing   Problem: Elimination: Goal: Will not experience complications related to bowel motility Outcome: Progressing Goal: Will not experience complications related to urinary retention Outcome: Progressing   Problem: Pain Managment: Goal: General experience of comfort will improve Outcome: Progressing   Problem: Safety: Goal: Ability to remain free from injury will improve Outcome: Progressing   Problem: Skin Integrity: Goal: Risk for impaired skin integrity will decrease Outcome: Progressing

## 2022-06-05 NOTE — Discharge Instructions (Signed)
If you have suicidal thoughts or feelings, call [phone] or go to the nearest ED.   Consider some of the counseling resources below:   Psychiatry Resource List (Adults and Children) Most of these providers will take Medicaid. please consult your insurance for a complete and updated list of available providers. When calling to make an appointment have your insurance information available to confirm you are covered.     [facility]:Psychiatry and Counseling [address] [place], [place] [address] [phone]   [facility]  [address] [place], [place] [phone] Crisis [phone]     [facility]:   [facility]: [address]     [phone]   [facility]: [address] [place]. [place],        [phone] [place]: [address] [phone],    [phone] [place]: [phone] [address],                   [phone] Children: [facility] [address]         [phone]   [facility] (virtual only) [phone]     [facility]  (Psychiatry only; Adults /children 12 and over, will take Medicaid)  [address] Laurell Josephs [address], [place], [place] [address]       [phone]     [facility] (Psychiatry & counseling ; adults & children ; will take Medicaid [address]  [place] [address]  Go on-line to complete referral ( [email or web address]/[email or web address] [phone]    (Spanish speaking therapists)   [facility]  Psychiatry & counseling; Adults and children;  Call Registration prior to scheduling an appointment [phone] [address]    [place], [place] [address]    [phone]   [facility] (Psychiatry & counseling; adults & children; Medicare no Medicaid)  [address]   [place], [place]  [address]      [phone]     [facility] (up to age 47)  Psychiatry & counseling ,will take Medicaid, must do counseling to receive psychiatry services  [address] [place] [address]        [phone]   [facility] (Psychiatry & counseling; adults & children; will take Medicaid) Will need a referral from provider [address],  [place], [place]  [phone]     [facility] --- Walk-In Mon-Friday 8am-3pm ( will take Medicaid, Psychiatry, Adults & children,  [address] [place], [place]   [phone]     [facility] [place], Walk-in M-F 8am-12pm and 1pm -3pm   (Counseling, Psychiatry, will take Medicaid, adults & children)  [address] [place], [place]  [phone]

## 2022-06-05 NOTE — Progress Notes (Addendum)
Inpatient Diabetes Program Recommendations  AACE/ADA: New Consensus Statement on Inpatient Glycemic Control (2015)  Target Ranges:  Prepandial:   less than 140 mg/dL      Peak postprandial:   less than 180 mg/dL (1-2 hours)      Critically ill patients:  140 - 180 mg/dL   Lab Results  Component Value Date   GLUCAP 211 (H) 06/05/2022   HGBA1C 6.8 (H) 04/22/2022    Review of Glycemic Control  Latest Reference Range & Units 06/04/22 11:10 06/04/22 16:02 06/04/22 20:26 06/05/22 06:15 06/05/22 11:55  Glucose-Capillary 70 - 99 mg/dL 134 (H) 454 (H) 369 (H) 110 (H) 211 (H)  (H): Data is abnormally high Diabetes history: Type 2 DM Outpatient Diabetes medications:  Current orders for Inpatient glycemic control: Novolog 0-20 units TID & HS, Semglee 15 units QD, Metformin 1000 mg BID Prednisone 60 mg QD  Inpatient Diabetes Program Recommendations:    Consider adding Novolog 4 units TID (assuming patient is consuming >50% of meals).  Will order insulin starter kit and plan to see.   Addendum: Spoke with patient regarding outpatient diabetes management. Patient has been on insulin in the past and has used insulin pens.  Reviewed patient's current A1c of 6.8%. Explained what a A1c is and what it measures. Also reviewed goal A1c with patient, importance of good glucose control @ home, and blood sugar goals. Reviewed patho of DM, need for insulin in the setting of steroids, risk of hypoglycemia when steroids are tapered, when to call MD, survival skills, interventions, vascular changes and commorbidities.  Patient will need a meter and testing supplies at discharge. Blood glucose monitor #[id number]. Reviewed recommended frequency. Encouraged patient make a follow up appointment.  Educated patient on insulin pen use at home. Reviewed contents of insulin flexpen starter kit. Reviewed all steps if insulin pen including attachment of needle, 2-unit air shot, dialing up dose, giving injection, removing  needle, disposal of sharps, storage of unused insulin, disposal of insulin etc. Patient able to provide successful return demonstration. Also reviewed troubleshooting with insulin pen. MD to give patient Rxs for insulin pens and insulin pen needles.  Thanks, Bronson Curb, MSN, RNC-OB Diabetes Coordinator [phone] (8a-5p)  Thanks, Bronson Curb, MSN, RNC-OB Diabetes Coordinator [phone] (8a-5p)

## 2022-06-05 NOTE — TOC Progression Note (Signed)
Transition of Care [facility]) - Progression Note    Patient Details  Name: Erin Jackson MRN: [id number] Date of Birth: 09-27-1966  Transition of Care [facility] CM/SW Contact  Kermit Balo, RN Phone Number: 06/05/2022, 1:27 PM  Clinical Narrative:    Pt provided bed offers for SNF rehab. She selected [facility]. CM has sent Kitty with [facility] a message. MD says not ready until next week. Will try and get auth for SNF started on Sunday.  TOC following.    Expected Discharge Plan: Home/Self Care Barriers to Discharge: Continued Medical Work up  Expected Discharge Plan and Services Expected Discharge Plan: Home/Self Care   Discharge Planning Services: CM Consult   Living arrangements for the past 2 months: Apartment                                       Social Determinants of Health (SDOH) Interventions    Readmission Risk Interventions     No data to display

## 2022-06-05 NOTE — Progress Notes (Signed)
Occupational Therapy Treatment Patient Details Name: Erin Jackson MRN: [id number] DOB: August 14, 1966 Today's Date: 06/05/2022   History of present illness Pt is a 56 y/o F presenting on 6/11 with sudden loss of vision, L sided headache.  MRI concerning for temporal arteritis.  S/P temporal artery biopsy 6/13. Noted recent admission for CVA last month. PMH of HTN, DM, Anxiety, GERD, and recent heart cath on 4/20.   OT comments  Reviewed fall prevention techniques and compensatory techniques for vision, provided handout. Completing transfers and mobility using RW with min guard, ADLs with min guard.  L bias during mobility but self correcting, cueing to complete visual scanning.  Educated and provided handout for services for the blind.  Will follow acutely.    Recommendations for follow up therapy are one component of a multi-disciplinary discharge planning process, led by the attending physician.  Recommendations may be updated based on patient status, additional functional criteria and insurance authorization.    Follow Up Recommendations  Skilled nursing-short term rehab (<3 hours/day)    Assistance Recommended at Discharge Intermittent Supervision/Assistance  Patient can return home with the following  A little help with walking and/or transfers;A little help with bathing/dressing/bathroom;Assistance with cooking/housework;Direct supervision/assist for medications management;Direct supervision/assist for financial management;Assist for transportation;Help with stairs or ramp for entrance   Equipment Recommendations  Other (comment) (defer)    Recommendations for Other Services      Precautions / Restrictions Precautions Precautions: Fall Restrictions Weight Bearing Restrictions: No       Mobility Bed Mobility               General bed mobility comments: EOB    Transfers                         Balance Overall balance assessment: Needs  assistance Sitting-balance support: Feet supported Sitting balance-Leahy Scale: Good     Standing balance support: Bilateral upper extremity supported, During functional activity Standing balance-Leahy Scale: Fair                             ADL either performed or assessed with clinical judgement   ADL Overall ADL's : Needs assistance/impaired     Grooming: Supervision/safety;Standing               Lower Body Dressing: Min guard;Sit to/from stand   Toilet Transfer: Min guard;Ambulation;Rolling walker (2 wheels)           Functional mobility during ADLs: Min guard;Rolling walker (2 wheels) General ADL Comments: R sided bias with using RW, able to self correct.  Cueing to scan and locate items on L side in hallway.    Extremity/Trunk Assessment              Vision   Additional Comments: continues to have difficulty scanning towards L side functionally, requesting an eye patch.  She undershoot when reaching for door knob.   Perception     Praxis      Cognition Arousal/Alertness: Awake/alert Behavior During Therapy: Flat affect Overall Cognitive Status: Within Functional Limits for tasks assessed                                          Exercises      Shoulder Instructions       General Comments pt provided  with services for the blind handout, L visual deficits handout and reviewed with pt    Pertinent Vitals/ Pain       Pain Assessment Pain Assessment: Faces Faces Pain Scale: Hurts a little bit Pain Location: left foot Pain Descriptors / Indicators: Pins and needles Pain Intervention(s): Limited activity within patient's tolerance, Monitored during session, Repositioned  Home Living Family/patient expects to be discharged to:: Skilled nursing facility Living Arrangements: Children                                      Prior Functioning/Environment              Frequency  Min 3X/week         Progress Toward Goals  OT Goals(current goals can now be found in the care plan section)  Progress towards OT goals: Progressing toward goals  Acute Rehab OT Goals Patient Stated Goal: get to rehab OT Goal Formulation: With patient Time For Goal Achievement: 06/18/22 Potential to Achieve Goals: Good  Plan Frequency remains appropriate;Discharge plan needs to be updated    Co-evaluation                 AM-PAC OT "6 Clicks" Daily Activity     Outcome Measure   Help from another person eating meals?: None Help from another person taking care of personal grooming?: A Little Help from another person toileting, which includes using toliet, bedpan, or urinal?: A Little Help from another person bathing (including washing, rinsing, drying)?: A Little Help from another person to put on and taking off regular upper body clothing?: A Little Help from another person to put on and taking off regular lower body clothing?: A Little 6 Click Score: 19    End of Session Equipment Utilized During Treatment: Rolling walker (2 wheels)  OT Visit Diagnosis: Other abnormalities of gait and mobility (R26.89);Muscle weakness (generalized) (M62.81);Low vision, both eyes (H54.2)   Activity Tolerance Patient tolerated treatment well   Patient Left with call bell/phone within reach;with bed alarm set;Other (comment) (seated EOB)   Nurse Communication Mobility status        Time: 1007-1219 OT Time Calculation (min): 21 min  Charges: OT General Charges $OT Visit: 1 Visit OT Treatments $Self Care/Home Management : 8-22 mins  Erin Jackson, OT Acute Rehabilitation Services Office [phone]   Erin Jackson 06/05/2022, 2:11 PM

## 2022-06-05 NOTE — Progress Notes (Signed)
06/05/22 1100  Clinical Encounter Type  Visited With Patient;Health care provider  Visit Type Initial;Spiritual support  Referral From Nurse  Consult/Referral To Chaplain  Spiritual Encounters  Spiritual Needs Emotional   Chaplain met with patient who shared disbelief and heartbreak regarding loss of sight. Patient asked that Chaplain remain in the room during medical update and psychiatric consultation. Patient expressed grief due to loss of independence and lack of family support. Chaplain provided emotional support as patient openly shared her concerns.   Cathy Johnson, Resident Chaplain [phone] 

## 2022-06-06 DIAGNOSIS — H547 Unspecified visual loss: Secondary | ICD-10-CM | POA: Diagnosis not present

## 2022-06-06 LAB — GLUCOSE, CAPILLARY
Glucose-Capillary: 148 mg/dL — ABNORMAL HIGH (ref 70–99)
Glucose-Capillary: 165 mg/dL — ABNORMAL HIGH (ref 70–99)
Glucose-Capillary: 236 mg/dL — ABNORMAL HIGH (ref 70–99)
Glucose-Capillary: 271 mg/dL — ABNORMAL HIGH (ref 70–99)

## 2022-06-06 MED ORDER — FUROSEMIDE 10 MG/ML IJ SOLN
20.0000 mg | Freq: Once | INTRAMUSCULAR | Status: AC
  Administered 2022-06-06: 20 mg via INTRAVENOUS
  Filled 2022-06-06: qty 4

## 2022-06-06 NOTE — Progress Notes (Signed)
PROGRESS NOTE    Erin Jackson  IYM:[id number] DOB: 09-14-1966 DOA: 05/31/2022 PCP: Merryl Hacker, No   Brief Narrative:  Erin Jackson is a 56 y.o. female with history of recent admission for stroke last month, diabetes mellitus, hypertension, chronic kidney disease stage II, chronic pain, peripheral vascular disease presented to the ER after patient had persistent loss of vision since waking up in the morning on May 30, 2022.  No other complaint.   In the ER patient had MRI brain and MRI orbits CT angiogram of head and neck.  Neurology evaluated the patient at this time patient's symptoms are concerning for temporal arteritis started on prednisone admitted for further work-up.  ESR elevated.  Vascular consulted, temporal artery biopsy done. Pathology negative  Assessment & Plan:   Sudden loss of vision left eye/possible temporal arteritis: Patient presents with sudden loss of vision, left-sided headache and has tenderness on the left temporal area with ESR over 50 and she is 56 as well.  She meets criteria for temporal arteritis.  She has been seen by neurology.  Has been started on prednisone in an effort to reverse her vision loss however her vision has not improved. Likely unrecoverable. Vascular was consulted and patient had temporal artery biopsy. CIR evaluated and not a candidate. Prednisone 62m QD for 3-5 months. She will need outpatient follow up with Rheumatology and Opthamology. Discussed with Optho Dr. GTobe Sos No acute management inpatient. Biopsy results could be false negative especially in the context of getting empiric steroids prior to biopsy. Recommended continue current management.  Recent ischemic stroke: No focal deficit.  Continue statin and Plavix  Hypertensive urgency: Improved. Clonidine home dose increased. Started losartan during hospitalization  Anxiety: Continue home Xanax. Started low dose SSRI: Lexapro. Feels depressed as would be expected with new onset vision loss.  Psych consulted and following.  Diabetes mellitus type 2: Last hemoglobin A1c was 6.8 just last month.  She takes metformin at home.  Having significant steroid induced hyperglycemia. Now on long acting insulin and SSI. Increased home metformin dose. Will need to discharge on insulin given she will be on prednisone for several months going forward.  CKD stage IIIA: At baseline.  Monitor.  DVT prophylaxis: SCDs   Code Status: Full Code  Family Communication: No family present at bedside.  Status is: Inpatient Remains inpatient appropriate because: Placement   Consultants:  Neurology Vascular surgery Procedures:  None  Antimicrobials:  Anti-infectives (From admission, onward)    Start     Dose/Rate Route Frequency Ordered Stop   06/02/22 0600  cefUROXime (ZINACEF) 1.5 g in sodium chloride 0.9 % 100 mL IVPB        1.5 g 200 mL/hr over 30 Minutes Intravenous On call to O.R. 06/01/22 0768006/13/23 1300         Subjective:  Seen and examined.  No new complaint.  No improvement in the vision.  Feeling depressed but denies SI.  Objective: Vitals:   06/06/22 0000 06/06/22 0413 06/06/22 0739 06/06/22 1206  BP: 135/81 (!) 145/80 (!) 130/95 (!) 145/83  Pulse: 97 91 (!) 110 99  Resp: '20 20 18 18  ' Temp: 97.8 F (36.6 C) 98.6 F (37 C) 98.4 F (36.9 C)   TempSrc: Oral Oral Oral   SpO2: 98% 98% 98% 99%  Weight:      Height:        Intake/Output Summary (Last 24 hours) at 06/06/2022 1616 Last data filed at 06/06/2022 0800 Gross per 24 hour  Intake 480 ml  Output --  Net 480 ml    Filed Weights   06/02/22 1101  Weight: 90.7 kg    Examination:  General exam: Appears calm and comfortable  Respiratory system: Clear to auscultation. Respiratory effort normal. Cardiovascular system: S1 & S2 heard, RRR. No JVD, murmurs, rubs, gallops or clicks. No pedal edema. Gastrointestinal system: Abdomen is nondistended, soft and nontender. No organomegaly or masses felt. Normal bowel  sounds heard. Central nervous system: Alert and oriented. No focal neurological deficits.  Extremities: Symmetric 5 x 5 power. 1+ pedal edema Skin: No rashes, lesions or ulcers.  Psychiatry: Flat affect  Data Reviewed: I have personally reviewed following labs and imaging studies  CBC: Recent Labs  Lab 05/31/22 1410 06/02/22 0453 06/05/22 0210  WBC 6.6 8.6 8.9  NEUTROABS 2.9  --  4.6  HGB 10.9* 10.5* 10.0*  HCT 34.6* 32.6* 30.4*  MCV 92.0 87.2 87.6  PLT 266 249 950    Basic Metabolic Panel: Recent Labs  Lab 05/31/22 1410 06/02/22 0453 06/05/22 0210  NA 138 138 137  K 3.9 3.4* 4.2  CL 107 105 106  CO2 20* 24 22  GLUCOSE 140* 166* 141*  BUN 20 25* 25*  CREATININE 1.24* 1.28* 1.25*  CALCIUM 7.1* 7.4* 7.4*    GFR: Estimated Creatinine Clearance: 51.3 mL/min (A) (by C-G formula based on SCr of 1.25 mg/dL (H)). Liver Function Tests: Recent Labs  Lab 05/31/22 1410  AST 21  ALT 10  ALKPHOS 72  BILITOT 0.4  PROT 6.5  ALBUMIN 2.9*    No results for input(s): "LIPASE", "AMYLASE" in the last 168 hours. No results for input(s): "AMMONIA" in the last 168 hours. Coagulation Profile: Recent Labs  Lab 06/01/22 0120  INR 1.2    Cardiac Enzymes: No results for input(s): "CKTOTAL", "CKMB", "CKMBINDEX", "TROPONINI" in the last 168 hours. BNP (last 3 results) No results for input(s): "PROBNP" in the last 8760 hours. HbA1C: No results for input(s): "HGBA1C" in the last 72 hours. CBG: Recent Labs  Lab 06/05/22 1155 06/05/22 1615 06/05/22 2057 06/06/22 0634 06/06/22 1208  GLUCAP 211* 326* 199* 148* 165*    Lipid Profile: No results for input(s): "CHOL", "HDL", "LDLCALC", "TRIG", "CHOLHDL", "LDLDIRECT" in the last 72 hours. Thyroid Function Tests: No results for input(s): "TSH", "T4TOTAL", "FREET4", "T3FREE", "THYROIDAB" in the last 72 hours. Anemia Panel: Recent Labs    06/05/22 0210  VITAMINB12 626    Sepsis Labs: No results for input(s):  "PROCALCITON", "LATICACIDVEN" in the last 168 hours.  No results found for this or any previous visit (from the past 240 hour(s)).   Radiology Studies: No results found.  Scheduled Meds:  allopurinol  100 mg Oral Daily   atorvastatin  80 mg Oral Daily   cloNIDine  0.3 mg Oral TID   clopidogrel  75 mg Oral Daily   enoxaparin (LOVENOX) injection  40 mg Subcutaneous Q24H   escitalopram  5 mg Oral Daily   hydrOXYzine  25 mg Oral TID   insulin aspart  0-20 Units Subcutaneous TID WC   insulin aspart  0-5 Units Subcutaneous QHS   insulin glargine-yfgn  20 Units Subcutaneous QHS   insulin starter kit- pen needles  1 kit Other Once   living well with diabetes book   Does not apply Once   losartan  100 mg Oral Daily   metFORMIN  1,000 mg Oral BID WC   pantoprazole  80 mg Oral Daily   predniSONE  60 mg Oral  Q breakfast   traZODone  150 mg Oral QHS       LOS: 5 days   Leslee Home, MD [facility]  06/06/2022, 4:16 PM   *Please note that this is a verbal dictation therefore any spelling or grammatical errors are due to the "[place] One" system interpretation.  Please page via [place] and do not message via secure chat for urgent patient care matters. Secure chat can be used for non urgent patient care matters.  How to contact the [facility] Attending or Consulting provider [place] or covering provider during after hours [place], for this patient?  Check the care team in [facility] and look for a) attending/consulting TRH provider listed and b) the [facility] team listed. Page or secure chat [phone]-7P. Log into [email or web address] and use [place]'s universal password to access. If you do not have the password, please contact the hospital operator. Locate the [facility] provider you are looking for under [facility] and page to a number that you can be directly reached. If you still have difficulty reaching the provider, please page the [facility] (Director on Call) for the Hospitalists listed on amion for  assistance.

## 2022-06-07 DIAGNOSIS — H547 Unspecified visual loss: Secondary | ICD-10-CM | POA: Diagnosis not present

## 2022-06-07 LAB — GLUCOSE, CAPILLARY
Glucose-Capillary: 138 mg/dL — ABNORMAL HIGH (ref 70–99)
Glucose-Capillary: 198 mg/dL — ABNORMAL HIGH (ref 70–99)
Glucose-Capillary: 228 mg/dL — ABNORMAL HIGH (ref 70–99)
Glucose-Capillary: 240 mg/dL — ABNORMAL HIGH (ref 70–99)
Glucose-Capillary: 329 mg/dL — ABNORMAL HIGH (ref 70–99)

## 2022-06-07 MED ORDER — ESCITALOPRAM OXALATE 10 MG PO TABS
10.0000 mg | ORAL_TABLET | Freq: Every day | ORAL | Status: DC
  Administered 2022-06-08: 10 mg via ORAL
  Filled 2022-06-07: qty 1

## 2022-06-07 MED ORDER — INSULIN GLARGINE-YFGN 100 UNIT/ML ~~LOC~~ SOLN
25.0000 [IU] | Freq: Every day | SUBCUTANEOUS | Status: DC
  Administered 2022-06-07: 25 [IU] via SUBCUTANEOUS
  Filled 2022-06-07 (×2): qty 0.25

## 2022-06-07 MED ORDER — HYDROCODONE-ACETAMINOPHEN 10-325 MG PO TABS
1.0000 | ORAL_TABLET | Freq: Three times a day (TID) | ORAL | Status: DC | PRN
  Administered 2022-06-07 – 2022-06-08 (×2): 1 via ORAL
  Filled 2022-06-07 (×2): qty 1

## 2022-06-07 MED ORDER — MORPHINE SULFATE (PF) 2 MG/ML IV SOLN
1.0000 mg | INTRAVENOUS | Status: DC | PRN
  Administered 2022-06-07: 1 mg via INTRAVENOUS
  Filled 2022-06-07: qty 1

## 2022-06-07 NOTE — Progress Notes (Signed)
Plan remains for [facility] on Monday pending auth. [employer] auth started (ref # [id number]) and appropriate clinicals faxed for review. Anticipate determination by Monday. SW/CM will follow and assist as indicated.   Dellie Burns, MSW, LCSW [phone] (coverage)

## 2022-06-07 NOTE — Plan of Care (Signed)
  Problem: Coping: Goal: Ability to adjust to condition or change in health will improve Outcome: Progressing   Problem: Fluid Volume: Goal: Ability to maintain a balanced intake and output will improve Outcome: Progressing   Problem: Health Behavior/Discharge Planning: Goal: Ability to identify and utilize available resources and services will improve Outcome: Progressing Goal: Ability to manage health-related needs will improve Outcome: Progressing   Problem: Metabolic: Goal: Ability to maintain appropriate glucose levels will improve Outcome: Progressing   Problem: Education: Goal: Knowledge of General Education information will improve Description: Including pain rating scale, medication(s)/side effects and non-pharmacologic comfort measures Outcome: Progressing   Problem: Health Behavior/Discharge Planning: Goal: Ability to manage health-related needs will improve Outcome: Progressing   Problem: Clinical Measurements: Goal: Ability to maintain clinical measurements within normal limits will improve Outcome: Progressing Goal: Will remain free from infection Outcome: Progressing Goal: Diagnostic test results will improve Outcome: Progressing Goal: Respiratory complications will improve Outcome: Progressing Goal: Cardiovascular complication will be avoided Outcome: Progressing   Problem: Activity: Goal: Risk for activity intolerance will decrease Outcome: Progressing   Problem: Nutrition: Goal: Adequate nutrition will be maintained Outcome: Progressing   Problem: Coping: Goal: Level of anxiety will decrease Outcome: Progressing   Problem: Elimination: Goal: Will not experience complications related to bowel motility Outcome: Progressing Goal: Will not experience complications related to urinary retention Outcome: Progressing   Problem: Pain Managment: Goal: General experience of comfort will improve Outcome: Progressing   Problem: Safety: Goal: Ability  to remain free from injury will improve Outcome: Progressing   Problem: Skin Integrity: Goal: Risk for impaired skin integrity will decrease Outcome: Progressing

## 2022-06-07 NOTE — Progress Notes (Signed)
PROGRESS NOTE    Erin Jackson  UOH:[id number] DOB: Apr 15, 1966 DOA: 05/31/2022 PCP: Merryl Hacker, No   Brief Narrative:  Erin Jackson is a 56 y.o. female with history of recent admission for stroke last month, diabetes mellitus, hypertension, chronic kidney disease stage II, chronic pain, peripheral vascular disease presented to the ER after patient had persistent loss of vision since waking up in the morning on May 30, 2022.  No other complaint.   In the ER patient had MRI brain and MRI orbits CT angiogram of head and neck.  Neurology evaluated the patient at this time patient's symptoms are concerning for temporal arteritis started on prednisone admitted for further work-up.  ESR elevated.  Vascular consulted, temporal artery biopsy done. Pathology negative  Assessment & Plan:   Sudden loss of vision left eye/possible temporal arteritis: Patient presents with sudden loss of vision, left-sided headache and has tenderness on the left temporal area with ESR over 50 and she is 56 as well.  She meets criteria for temporal arteritis.  She has been seen by neurology.  Has been started on prednisone in an effort to reverse her vision loss however her vision has not improved. Likely unrecoverable. Vascular was consulted and patient had temporal artery biopsy. CIR evaluated and not a candidate. Prednisone 83m QD for 3-5 months. She will need outpatient follow up with Rheumatology and Opthamology. Discussed with Optho Dr. GTobe Sos No acute management inpatient. Biopsy results could be false negative especially in the context of getting empiric steroids prior to biopsy. Recommended continuing current management.  Recent ischemic stroke: No focal deficit.  Continue statin and Plavix  Hypertensive urgency: Improved. Clonidine home dose increased. Started losartan during hospitalization  Anxiety: Continue home Xanax. Started SSRI: Lexapro. Feels depressed as would be expected with new onset vision loss. Psych  consulted and following.  Diabetes mellitus type 2: Non-insulin dependant prior to his hospitalization. Last hemoglobin A1c was 6.8 just last month.  She takes metformin at home.  Having significant steroid induced hyperglycemia. Now on long acting insulin and SSI. Increased home metformin dose to max. Will need to be discharged on insulin given she will be on prednisone for several months going forward. Increased Semglee to 25 units daily today.   CKD stage IIIA: At baseline.  Monitor.  DVT prophylaxis: SCDs   Code Status: Full Code  Family Communication: No family present at bedside.  Status is: Inpatient Remains inpatient appropriate because: Placement awaiting HKaryl Kinnier  Consultants:  Neurology Vascular surgery Procedures:  None  Antimicrobials:  Anti-infectives (From admission, onward)    Start     Dose/Rate Route Frequency Ordered Stop   06/02/22 0600  cefUROXime (ZINACEF) 1.5 g in sodium chloride 0.9 % 100 mL IVPB        1.5 g 200 mL/hr over 30 Minutes Intravenous On call to O.R. 06/01/22 0520806/13/23 1300         Subjective:  Seen and examined.  No new complaint.  No improvement in the vision.  Feeling depressed but denies SI.  Objective: Vitals:   06/06/22 1704 06/06/22 2036 06/07/22 0524 06/07/22 1053  BP: (!) 160/105 126/88 (!) 160/92 (!) 104/53  Pulse: 98 94 88 88  Resp: _0 Temp:  98.4 F (36.9 C) 98.2 F (36.8 C) 97.9 F (36.6 C)  TempSrc:  Oral Oral Oral  SpO2: 100% 99% 99% 97%  Weight:      Height:        Intake/Output Summary (  Last 24 hours) at 06/07/2022 1511 Last data filed at 06/07/2022 0727 Gross per 24 hour  Intake 240 ml  Output --  Net 240 ml    Filed Weights   06/02/22 1101  Weight: 90.7 kg    Examination:  General exam: Appears calm and comfortable  Respiratory system: Clear to auscultation. Respiratory effort normal. Cardiovascular system: S1 & S2 heard, RRR. No JVD, murmurs, rubs, gallops or clicks. No pedal  edema. Gastrointestinal system: Abdomen is nondistended, soft and nontender. No organomegaly or masses felt. Normal bowel sounds heard. Central nervous system: Alert and oriented. No focal neurological deficits.  Extremities: Symmetric 5 x 5 power. 1+ pedal edema Skin: No rashes, lesions or ulcers.  Psychiatry: Flat affect  Data Reviewed: I have personally reviewed following labs and imaging studies  CBC: Recent Labs  Lab 06/02/22 0453 06/05/22 0210  WBC 8.6 8.9  NEUTROABS  --  4.6  HGB 10.5* 10.0*  HCT 32.6* 30.4*  MCV 87.2 87.6  PLT 249 323    Basic Metabolic Panel: Recent Labs  Lab 06/02/22 0453 06/05/22 0210  NA 138 137  K 3.4* 4.2  CL 105 106  CO2 24 22  GLUCOSE 166* 141*  BUN 25* 25*  CREATININE 1.28* 1.25*  CALCIUM 7.4* 7.4*    GFR: Estimated Creatinine Clearance: 51.3 mL/min (A) (by C-G formula based on SCr of 1.25 mg/dL (H)). Liver Function Tests: No results for input(s): "AST", "ALT", "ALKPHOS", "BILITOT", "PROT", "ALBUMIN" in the last 168 hours.  No results for input(s): "LIPASE", "AMYLASE" in the last 168 hours. No results for input(s): "AMMONIA" in the last 168 hours. Coagulation Profile: Recent Labs  Lab 06/01/22 0120  INR 1.2    Cardiac Enzymes: No results for input(s): "CKTOTAL", "CKMB", "CKMBINDEX", "TROPONINI" in the last 168 hours. BNP (last 3 results) No results for input(s): "PROBNP" in the last 8760 hours. HbA1C: No results for input(s): "HGBA1C" in the last 72 hours. CBG: Recent Labs  Lab 06/06/22 1705 06/06/22 1957 06/07/22 0005 06/07/22 0318 06/07/22 1340  GLUCAP 236* 271* 198* 138* 329*    Lipid Profile: No results for input(s): "CHOL", "HDL", "LDLCALC", "TRIG", "CHOLHDL", "LDLDIRECT" in the last 72 hours. Thyroid Function Tests: No results for input(s): "TSH", "T4TOTAL", "FREET4", "T3FREE", "THYROIDAB" in the last 72 hours. Anemia Panel: Recent Labs    06/05/22 0210  VITAMINB12 626    Sepsis Labs: No results  for input(s): "PROCALCITON", "LATICACIDVEN" in the last 168 hours.  No results found for this or any previous visit (from the past 240 hour(s)).   Radiology Studies: No results found.  Scheduled Meds:  allopurinol  100 mg Oral Daily   atorvastatin  80 mg Oral Daily   cloNIDine  0.3 mg Oral TID   clopidogrel  75 mg Oral Daily   enoxaparin (LOVENOX) injection  40 mg Subcutaneous Q24H   [START ON 06/08/2022] escitalopram  10 mg Oral Daily   hydrOXYzine  25 mg Oral TID   insulin aspart  0-20 Units Subcutaneous TID WC   insulin aspart  0-5 Units Subcutaneous QHS   insulin glargine-yfgn  25 Units Subcutaneous QHS   insulin starter kit- pen needles  1 kit Other Once   living well with diabetes book   Does not apply Once   losartan  100 mg Oral Daily   metFORMIN  1,000 mg Oral BID WC   pantoprazole  80 mg Oral Daily   predniSONE  60 mg Oral Q breakfast   traZODone  150 mg Oral  QHS       LOS: 6 days   Leslee Home, MD [facility]  06/07/2022, 3:11 PM   *Please note that this is a verbal dictation therefore any spelling or grammatical errors are due to the "[place] One" system interpretation.  Please page via [place] and do not message via secure chat for urgent patient care matters. Secure chat can be used for non urgent patient care matters.  How to contact the [facility] Attending or Consulting provider [place] or covering provider during after hours [place], for this patient?  Check the care team in [facility] and look for a) attending/consulting TRH provider listed and b) the [facility] team listed. Page or secure chat [phone]-7P. Log into [email or web address] and use [place]'s universal password to access. If you do not have the password, please contact the hospital operator. Locate the [facility] provider you are looking for under [facility] and page to a number that you can be directly reached. If you still have difficulty reaching the provider, please page the [facility] (Director on Call) for the  Hospitalists listed on amion for assistance.

## 2022-06-08 DIAGNOSIS — N1832 Chronic kidney disease, stage 3b: Secondary | ICD-10-CM | POA: Diagnosis not present

## 2022-06-08 DIAGNOSIS — I130 Hypertensive heart and chronic kidney disease with heart failure and stage 1 through stage 4 chronic kidney disease, or unspecified chronic kidney disease: Secondary | ICD-10-CM | POA: Diagnosis not present

## 2022-06-08 DIAGNOSIS — I3139 Other pericardial effusion (noninflammatory): Secondary | ICD-10-CM | POA: Diagnosis not present

## 2022-06-08 DIAGNOSIS — R519 Headache, unspecified: Secondary | ICD-10-CM | POA: Diagnosis not present

## 2022-06-08 DIAGNOSIS — E1122 Type 2 diabetes mellitus with diabetic chronic kidney disease: Secondary | ICD-10-CM | POA: Diagnosis not present

## 2022-06-08 DIAGNOSIS — H5440 Blindness, one eye, unspecified eye: Secondary | ICD-10-CM | POA: Diagnosis not present

## 2022-06-08 DIAGNOSIS — I10 Essential (primary) hypertension: Secondary | ICD-10-CM | POA: Diagnosis not present

## 2022-06-08 DIAGNOSIS — K219 Gastro-esophageal reflux disease without esophagitis: Secondary | ICD-10-CM | POA: Diagnosis not present

## 2022-06-08 DIAGNOSIS — S0590XA Unspecified injury of unspecified eye and orbit, initial encounter: Secondary | ICD-10-CM | POA: Diagnosis not present

## 2022-06-08 DIAGNOSIS — M6281 Muscle weakness (generalized): Secondary | ICD-10-CM | POA: Diagnosis not present

## 2022-06-08 DIAGNOSIS — R0902 Hypoxemia: Secondary | ICD-10-CM | POA: Diagnosis not present

## 2022-06-08 DIAGNOSIS — R112 Nausea with vomiting, unspecified: Secondary | ICD-10-CM | POA: Diagnosis not present

## 2022-06-08 DIAGNOSIS — I679 Cerebrovascular disease, unspecified: Secondary | ICD-10-CM | POA: Diagnosis not present

## 2022-06-08 DIAGNOSIS — J9811 Atelectasis: Secondary | ICD-10-CM | POA: Diagnosis not present

## 2022-06-08 DIAGNOSIS — Z981 Arthrodesis status: Secondary | ICD-10-CM | POA: Diagnosis not present

## 2022-06-08 DIAGNOSIS — Z20822 Contact with and (suspected) exposure to covid-19: Secondary | ICD-10-CM | POA: Diagnosis not present

## 2022-06-08 DIAGNOSIS — R0602 Shortness of breath: Secondary | ICD-10-CM | POA: Diagnosis not present

## 2022-06-08 DIAGNOSIS — Z7189 Other specified counseling: Secondary | ICD-10-CM | POA: Diagnosis not present

## 2022-06-08 DIAGNOSIS — Z743 Need for continuous supervision: Secondary | ICD-10-CM | POA: Diagnosis not present

## 2022-06-08 DIAGNOSIS — F411 Generalized anxiety disorder: Secondary | ICD-10-CM | POA: Diagnosis not present

## 2022-06-08 DIAGNOSIS — Z7401 Bed confinement status: Secondary | ICD-10-CM | POA: Diagnosis not present

## 2022-06-08 DIAGNOSIS — R262 Difficulty in walking, not elsewhere classified: Secondary | ICD-10-CM | POA: Diagnosis not present

## 2022-06-08 DIAGNOSIS — D72829 Elevated white blood cell count, unspecified: Secondary | ICD-10-CM | POA: Diagnosis not present

## 2022-06-08 DIAGNOSIS — M316 Other giant cell arteritis: Secondary | ICD-10-CM | POA: Diagnosis not present

## 2022-06-08 DIAGNOSIS — I69352 Hemiplegia and hemiparesis following cerebral infarction affecting left dominant side: Secondary | ICD-10-CM | POA: Diagnosis not present

## 2022-06-08 DIAGNOSIS — R5381 Other malaise: Secondary | ICD-10-CM | POA: Diagnosis not present

## 2022-06-08 DIAGNOSIS — G4489 Other headache syndrome: Secondary | ICD-10-CM | POA: Diagnosis not present

## 2022-06-08 DIAGNOSIS — R2689 Other abnormalities of gait and mobility: Secondary | ICD-10-CM | POA: Diagnosis not present

## 2022-06-08 DIAGNOSIS — E1169 Type 2 diabetes mellitus with other specified complication: Secondary | ICD-10-CM | POA: Diagnosis not present

## 2022-06-08 DIAGNOSIS — D649 Anemia, unspecified: Secondary | ICD-10-CM | POA: Diagnosis not present

## 2022-06-08 DIAGNOSIS — R1111 Vomiting without nausea: Secondary | ICD-10-CM | POA: Diagnosis not present

## 2022-06-08 DIAGNOSIS — I771 Stricture of artery: Secondary | ICD-10-CM | POA: Diagnosis not present

## 2022-06-08 DIAGNOSIS — F17210 Nicotine dependence, cigarettes, uncomplicated: Secondary | ICD-10-CM | POA: Diagnosis not present

## 2022-06-08 DIAGNOSIS — M2550 Pain in unspecified joint: Secondary | ICD-10-CM | POA: Diagnosis not present

## 2022-06-08 DIAGNOSIS — F17200 Nicotine dependence, unspecified, uncomplicated: Secondary | ICD-10-CM | POA: Diagnosis not present

## 2022-06-08 DIAGNOSIS — I69354 Hemiplegia and hemiparesis following cerebral infarction affecting left non-dominant side: Secondary | ICD-10-CM | POA: Diagnosis not present

## 2022-06-08 DIAGNOSIS — I152 Hypertension secondary to endocrine disorders: Secondary | ICD-10-CM | POA: Diagnosis not present

## 2022-06-08 DIAGNOSIS — Z7902 Long term (current) use of antithrombotics/antiplatelets: Secondary | ICD-10-CM | POA: Diagnosis not present

## 2022-06-08 DIAGNOSIS — I517 Cardiomegaly: Secondary | ICD-10-CM | POA: Diagnosis not present

## 2022-06-08 DIAGNOSIS — I119 Hypertensive heart disease without heart failure: Secondary | ICD-10-CM | POA: Diagnosis not present

## 2022-06-08 DIAGNOSIS — Z79899 Other long term (current) drug therapy: Secondary | ICD-10-CM | POA: Diagnosis not present

## 2022-06-08 DIAGNOSIS — R42 Dizziness and giddiness: Secondary | ICD-10-CM | POA: Diagnosis not present

## 2022-06-08 DIAGNOSIS — N179 Acute kidney failure, unspecified: Secondary | ICD-10-CM | POA: Diagnosis not present

## 2022-06-08 DIAGNOSIS — R531 Weakness: Secondary | ICD-10-CM | POA: Diagnosis not present

## 2022-06-08 DIAGNOSIS — I739 Peripheral vascular disease, unspecified: Secondary | ICD-10-CM | POA: Diagnosis not present

## 2022-06-08 DIAGNOSIS — H5462 Unqualified visual loss, left eye, normal vision right eye: Secondary | ICD-10-CM | POA: Diagnosis not present

## 2022-06-08 DIAGNOSIS — I6503 Occlusion and stenosis of bilateral vertebral arteries: Secondary | ICD-10-CM | POA: Diagnosis not present

## 2022-06-08 DIAGNOSIS — Z91199 Patient's noncompliance with other medical treatment and regimen due to unspecified reason: Secondary | ICD-10-CM | POA: Diagnosis not present

## 2022-06-08 DIAGNOSIS — R600 Localized edema: Secondary | ICD-10-CM | POA: Diagnosis not present

## 2022-06-08 DIAGNOSIS — Z9181 History of falling: Secondary | ICD-10-CM | POA: Diagnosis not present

## 2022-06-08 DIAGNOSIS — G4733 Obstructive sleep apnea (adult) (pediatric): Secondary | ICD-10-CM | POA: Diagnosis not present

## 2022-06-08 DIAGNOSIS — H547 Unspecified visual loss: Secondary | ICD-10-CM | POA: Diagnosis not present

## 2022-06-08 DIAGNOSIS — E1159 Type 2 diabetes mellitus with other circulatory complications: Secondary | ICD-10-CM | POA: Diagnosis not present

## 2022-06-08 DIAGNOSIS — E860 Dehydration: Secondary | ICD-10-CM | POA: Diagnosis not present

## 2022-06-08 DIAGNOSIS — M180 Bilateral primary osteoarthritis of first carpometacarpal joints: Secondary | ICD-10-CM | POA: Diagnosis not present

## 2022-06-08 DIAGNOSIS — Z794 Long term (current) use of insulin: Secondary | ICD-10-CM | POA: Diagnosis not present

## 2022-06-08 DIAGNOSIS — Z7984 Long term (current) use of oral hypoglycemic drugs: Secondary | ICD-10-CM | POA: Diagnosis not present

## 2022-06-08 DIAGNOSIS — I471 Supraventricular tachycardia: Secondary | ICD-10-CM | POA: Diagnosis not present

## 2022-06-08 DIAGNOSIS — E119 Type 2 diabetes mellitus without complications: Secondary | ICD-10-CM | POA: Diagnosis not present

## 2022-06-08 DIAGNOSIS — I2510 Atherosclerotic heart disease of native coronary artery without angina pectoris: Secondary | ICD-10-CM | POA: Diagnosis not present

## 2022-06-08 DIAGNOSIS — K449 Diaphragmatic hernia without obstruction or gangrene: Secondary | ICD-10-CM | POA: Diagnosis not present

## 2022-06-08 DIAGNOSIS — I5022 Chronic systolic (congestive) heart failure: Secondary | ICD-10-CM | POA: Diagnosis not present

## 2022-06-08 LAB — CBC
HCT: 31.4 % — ABNORMAL LOW (ref 36.0–46.0)
Hemoglobin: 9.9 g/dL — ABNORMAL LOW (ref 12.0–15.0)
MCH: 28.1 pg (ref 26.0–34.0)
MCHC: 31.5 g/dL (ref 30.0–36.0)
MCV: 89.2 fL (ref 80.0–100.0)
Platelets: 192 10*3/uL (ref 150–400)
RBC: 3.52 MIL/uL — ABNORMAL LOW (ref 3.87–5.11)
RDW: 14 % (ref 11.5–15.5)
WBC: 10.3 10*3/uL (ref 4.0–10.5)
nRBC: 0 % (ref 0.0–0.2)

## 2022-06-08 LAB — BASIC METABOLIC PANEL
Anion gap: 8 (ref 5–15)
BUN: 24 mg/dL — ABNORMAL HIGH (ref 6–20)
CO2: 23 mmol/L (ref 22–32)
Calcium: 8.5 mg/dL — ABNORMAL LOW (ref 8.9–10.3)
Chloride: 105 mmol/L (ref 98–111)
Creatinine, Ser: 1.06 mg/dL — ABNORMAL HIGH (ref 0.44–1.00)
GFR, Estimated: >60 mL/min (ref >60)
Glucose, Bld: 120 mg/dL — ABNORMAL HIGH (ref 70–99)
Potassium: 4.1 mmol/L (ref 3.5–5.1)
Sodium: 136 mmol/L (ref 135–145)

## 2022-06-08 LAB — GLUCOSE, CAPILLARY
Glucose-Capillary: 123 mg/dL — ABNORMAL HIGH (ref 70–99)
Glucose-Capillary: 136 mg/dL — ABNORMAL HIGH (ref 70–99)

## 2022-06-08 MED ORDER — LOSARTAN POTASSIUM 100 MG PO TABS: 100.0000 mg | ORAL_TABLET | Freq: Every day | ORAL | Status: DC

## 2022-06-08 MED ORDER — HYDROCODONE-ACETAMINOPHEN 10-325 MG PO TABS: 1.0000 | ORAL_TABLET | Freq: Three times a day (TID) | ORAL | 0 refills | Status: DC | PRN

## 2022-06-08 MED ORDER — LANTUS 100 UNIT/ML ~~LOC~~ SOLN: 25.0000 [IU] | Freq: Every day | SUBCUTANEOUS | 11 refills | Status: DC

## 2022-06-08 MED ORDER — ALPRAZOLAM 2 MG PO TABS: 1.0000 mg | ORAL_TABLET | Freq: Two times a day (BID) | ORAL | 0 refills | Status: DC | PRN

## 2022-06-08 NOTE — Progress Notes (Signed)
Inpatient Diabetes Program Recommendations  AACE/ADA: New Consensus Statement on Inpatient Glycemic Control (2015)  Target Ranges:  Prepandial:   less than 140 mg/dL      Peak postprandial:   less than 180 mg/dL (1-2 hours)      Critically ill patients:  140 - 180 mg/dL   Lab Results  Component Value Date   GLUCAP 123 (H) 06/08/2022   HGBA1C 6.8 (H) 04/22/2022    Review of Glycemic Control  Latest Reference Range & Units 06/07/22 03:18 06/07/22 13:40 06/07/22 16:10 06/07/22 21:50 06/08/22 06:30  Glucose-Capillary 70 - 99 mg/dL 353 (H) 299 (H) 242 (H) 240 (H) 123 (H)  (H): Data is abnormally high  Diabetes history: Type 2 DM Outpatient Diabetes medications:  Current orders for Inpatient glycemic control: Novolog 0-20 units TID & HS, Semglee 25 units QD, Metformin 1000 mg BID Prednisone 60 mg QD   Inpatient Diabetes Program Recommendations:     Consider adding Novolog 4 units TID (assuming patient is consuming >50% of meals).  Will continue to follow while inpatient.  Thank you, Dulce Sellar, MSN, CDCES Diabetes Coordinator Inpatient Diabetes Program [phone] (team pager from 8a-5p)

## 2022-06-08 NOTE — TOC Transition Note (Signed)
Transition of Care [facility]) - CM/SW Discharge Note   Patient Details  Name: BRYNNLY BONET MRN: [id number] Date of Birth: [birth year]  [facility] CM/SW Contact:  Kermit Balo, RN Phone Number: 06/08/2022, 9:42 AM   Clinical Narrative:   [id number] SNF 06/08/2022-06/10/2022 Approved  Patient approved for [facility] but [facility] wont have a bed until Wednesday. CM updated the patient and [facility] decided on as they will have her a private room. Auth changed through [facility] to [facility].  Pt will transport via [facility]. Bedside RN updated and d/c packet is at the desk.  Pt wanted to call her son and update him.   Number for report: [phone]   Final next level of care: Skilled Nursing Facility Barriers to Discharge: No Barriers Identified   Patient Goals and CMS Choice   CMS Medicare.gov Compare Post Acute Care list provided to:: Patient Choice offered to / list presented to : Patient  Discharge Placement              Patient chooses bed at: [facility] Patient to be transferred to facility by: [facility]   Patient and family notified of of transfer: 06/08/22  Discharge Plan and Services   Discharge Planning Services: CM Consult                                 Social Determinants of Health (SDOH) Interventions     Readmission Risk Interventions     No data to display

## 2022-06-08 NOTE — Discharge Summary (Signed)
Erin Jackson IRS:[id number] DOB: 1966-04-11 DOA: 05/31/2022  PCP: Pcp, No  Admit date: 05/31/2022 Discharge date: 06/08/2022  Time spent: 35 minutes  Recommendations for Outpatient Follow-up:  Outpatient ophthalmology and rheumatology     Discharge Diagnoses:  Principal Problem:   Vision loss Active Problems:   T2DM (type 2 diabetes mellitus) ([place])   CAD (coronary artery disease)   Stage 3b chronic kidney disease ([place])   Chronic systolic heart failure ([place])   Temporal arteritis ([place])   Discharge Condition: stable  Diet recommendation: heart healthy  Filed Weights   06/02/22 1101  Weight: 90.7 kg    History of present illness:  Erin Jackson is a 56 y.o. female with history of recent admission for stroke last month, diabetes mellitus, hypertension, chronic kidney disease stage II, chronic pain, peripheral vascular disease presents to the ER after patient had persistent loss of vision since waking up in the morning on May 30, 2022.  Denies any weakness of the extremities.  Complains of left temporal headache same time.  Hospital Course:  Patient presented with left eye vision loss and headache. Found to have elevated inflammatory markers. Temporal artery biopsy by vascular negative for temporal arteritis but meets clinical criteria for the dx so treated as such. Neurology and ophthalmology both involved. Will need prednisone 60 mg daily for 3-5 months with outpatient follow-up with ophthalmology and rheumatology. For her elevated blood pressure losartan was added to her home regimen. For steroid-induced hyperglycemia, long-acting insulin was added to her home diabetes regimen.  Procedures: Temporal artery biopsy   Consultations: Neurology, vascular surgery, ophthalmology  Discharge Exam: Vitals:   06/08/22 0812 06/08/22 1210  BP: (!) 160/88 111/75  Pulse: (!) 108 97  Resp: 20 16  Temp: 98.2 F (36.8 C) 98.2 F (36.8 C)  SpO2: 100%     General:  NAD Cardiovascular: RRR Respiratory: CTAB   Discharge Instructions   Discharge Instructions     Ambulatory referral to Rheumatology   Complete by: As directed    Diet - low sodium heart healthy   Complete by: As directed    Increase activity slowly   Complete by: As directed    No wound care   Complete by: As directed       Allergies as of 06/08/2022       Reactions   Cucumber Extract Anaphylaxis   Molds & Smuts Shortness Of Breath   Norvasc [amlodipine] Other (See Comments)   Muscle became very tight   Other Anaphylaxis, Other (See Comments)   Tree nuts, walnuts, peanuts, pickles   Apple Juice Hives   Aspirin Rash   Ibuprofen Rash   Niacin And Related Rash        Medication List     STOP taking these medications    metoprolol tartrate 100 MG tablet Commonly known as: LOPRESSOR   nicotine 14 mg/24hr patch Commonly known as: NICODERM CQ - dosed in mg/24 hours       TAKE these medications    acetaminophen 325 MG tablet Commonly known as: TYLENOL Take 2 tablets (650 mg total) by mouth every 4 (four) hours as needed for fever or mild pain.   allopurinol 100 MG tablet Commonly known as: ZYLOPRIM Take 100 mg by mouth daily.   alprazolam 2 MG tablet Commonly known as: XANAX Take 0.5 tablets (1 mg total) by mouth 2 (two) times daily as needed for anxiety.   atorvastatin 80 MG tablet Commonly known as: LIPITOR Take 1 tablet (80 mg  total) by mouth daily.   cetirizine 10 MG tablet Commonly known as: ZyrTEC Allergy Take 1 tablet (10 mg total) by mouth daily. What changed:  when to take this reasons to take this   cloNIDine 0.1 MG tablet Commonly known as: CATAPRES Take 1 tablet (0.1 mg total) by mouth 2 (two) times daily.   clopidogrel 75 MG tablet Commonly known as: PLAVIX Take 1 tablet (75 mg total) by mouth daily.   EPINEPHrine 0.3 mg/0.3 mL Soaj injection Commonly known as: EPI-PEN Inject 0.3 mLs (0.3 mg total) into the muscle once as  needed (allergic reaction). What changed:  when to take this reasons to take this   HYDROcodone-acetaminophen 10-325 MG tablet Commonly known as: NORCO Take 1 tablet by mouth 3 (three) times daily as needed for moderate pain. What changed: Another medication with the same name was removed. Continue taking this medication, and follow the directions you see here.   hydrOXYzine 25 MG tablet Commonly known as: ATARAX Take 25 mg by mouth 3 (three) times daily.   Lantus 100 UNIT/ML injection Generic drug: insulin glargine Inject 0.25 mLs (25 Units total) into the skin at bedtime.   losartan 100 MG tablet Commonly known as: COZAAR Take 1 tablet (100 mg total) by mouth daily. Start taking on: June 09, 2022   meclizine 12.5 MG tablet Commonly known as: ANTIVERT Take 1 tablet (12.5 mg total) by mouth 2 (two) times daily as needed for dizziness.   metFORMIN 500 MG tablet Commonly known as: GLUCOPHAGE Take 1 tablet (500 mg total) by mouth 2 (two) times daily with a meal.   methocarbamol 500 MG tablet Commonly known as: ROBAXIN Take 1 tablet (500 mg total) by mouth every 6 (six) hours as needed for muscle spasms.   Narcan 4 MG/0.1ML Liqd nasal spray kit Generic drug: naloxone Place 0.4 mg into the nose as needed (opoid overdose).   nitroGLYCERIN 0.4 MG SL tablet Commonly known as: NITROSTAT Place 1 tablet (0.4 mg total) under the tongue every 5 (five) minutes as needed for chest pain.   omeprazole 40 MG capsule Commonly known as: PRILOSEC Take 40 mg by mouth 2 (two) times daily.   OneTouch Delica Plus [id number] Misc Apply topically daily.   OneTouch Verio test strip Generic drug: glucose blood daily.   prochlorperazine 5 MG tablet Commonly known as: COMPAZINE Take 1-2 tablets (5-10 mg total) by mouth every 6 (six) hours as needed for nausea.   traZODone 150 MG tablet Commonly known as: DESYREL Take 1 tablet (150 mg total) by mouth at bedtime.   Vitamin D  (Ergocalciferol) 1.25 MG (50000 UNIT) Caps capsule Commonly known as: DRISDOL Take 1 capsule (50,000 Units total) by mouth every 7 (seven) days. What changed: when to take this       Allergies  Allergen Reactions   Cucumber Extract Anaphylaxis   Molds & Smuts Shortness Of Breath   Norvasc [Amlodipine] Other (See Comments)    Muscle became very tight   Other Anaphylaxis and Other (See Comments)    Tree nuts, walnuts, peanuts, pickles   Apple Juice Hives   Aspirin Rash   Ibuprofen Rash   Niacin And Related Rash    Contact information for follow-up providers     Pecen, Jacqualyn Posey, MD. Schedule an appointment as soon as possible for a visit .   Contact information: [place] [address] [place] Follow up on 06/25/2022.  Why: your appointment is at 9:15 am. Please arrive early and bring: picture ID, insurance card, current medications Contact information: [place] [address] [phone]         Bretta Bang, MD. Schedule an appointment as soon as possible for a visit.   Specialty: Ophthalmology Contact information: [place] [address] [phone]              Contact information for after-discharge care     Destination     [facility] Preferred SNF .   Service: Skilled Nursing Contact information: [address] [place] [phone]                      The results of significant diagnostics from this hospitalization (including imaging, microbiology, ancillary and laboratory) are listed below for reference.    Significant Diagnostic Studies: CT ANGIO HEAD NECK W WO CM  Result Date: 05/31/2022 CLINICAL DATA:  Left eye vision loss for 1 week EXAM: CT ANGIOGRAPHY HEAD AND NECK TECHNIQUE: Multidetector CT imaging of the head and neck was performed using the standard protocol during bolus  administration of intravenous contrast. Multiplanar CT image reconstructions and MIPs were obtained to evaluate the vascular anatomy. Carotid stenosis measurements (when applicable) are obtained utilizing NASCET criteria, using the distal internal carotid diameter as the denominator. RADIATION DOSE REDUCTION: This exam was performed according to the departmental dose-optimization program which includes automated exposure control, adjustment of the mA and/or kV according to patient size and/or use of iterative reconstruction technique. CONTRAST:  45m OMNIPAQUE IOHEXOL 350 MG/ML SOLN COMPARISON:  None Available. FINDINGS: CT HEAD FINDINGS Brain: There is no mass, hemorrhage or extra-axial collection. The size and configuration of the ventricles and extra-axial CSF spaces are normal. There is no acute or chronic infarction. The brain parenchyma is normal. Skull: The visualized skull base, calvarium and extracranial soft tissues are normal. Sinuses/Orbits: No fluid levels or advanced mucosal thickening of the visualized paranasal sinuses. No mastoid or middle ear effusion. The orbits are normal. CTA NECK FINDINGS SKELETON: There is no bony spinal canal stenosis. No lytic or blastic lesion. OTHER NECK: Normal pharynx, larynx and major salivary glands. No cervical lymphadenopathy. Unremarkable thyroid gland. UPPER CHEST: No pneumothorax or pleural effusion. No nodules or masses. AORTIC ARCH: There is calcific atherosclerosis of the aortic arch. There is no aneurysm, dissection or hemodynamically significant stenosis of the visualized portion of the aorta. Conventional 3 vessel aortic branching pattern. The visualized proximal subclavian arteries are widely patent. RIGHT CAROTID SYSTEM: Normal without aneurysm, dissection or stenosis. LEFT CAROTID SYSTEM: No dissection, occlusion or aneurysm. Mild atherosclerotic calcification at the carotid bifurcation without hemodynamically significant stenosis. There is soft plaque  narrowing the distal left ICA causing less than 50% stenosis. VERTEBRAL ARTERIES: Left dominant configuration. Both origins are clearly patent. There is no dissection, occlusion or flow-limiting stenosis to the skull base (V1-V3 segments). CTA HEAD FINDINGS POSTERIOR CIRCULATION: --Vertebral arteries: Normal V4 segments. --Inferior cerebellar arteries: Normal. --Basilar artery: Normal. --Superior cerebellar arteries: Normal. --Posterior cerebral arteries (PCA): Normal. ANTERIOR CIRCULATION: --Intracranial internal carotid arteries: Normal. --Anterior cerebral arteries (ACA): Normal. Both A1 segments are present. Patent anterior communicating artery (a-comm). --Middle cerebral arteries (MCA): Normal. VENOUS SINUSES: As permitted by contrast timing, patent. ANATOMIC VARIANTS: None Review of the MIP images confirms the above findings. IMPRESSION: 1. No emergent large vessel occlusion or hemodynamically significant stenosis of the head or neck. 2. Soft plaque narrowing  the distal left ICA causing less than 50% stenosis. 3. Aortic Atherosclerosis (ICD10-I70.0). Electronically Signed   By: Ulyses Jarred M.D.   On: 05/31/2022 23:34   MR BRAIN WO CONTRAST  Result Date: 05/31/2022 CLINICAL DATA:  Monocular vision loss EXAM: MRI HEAD AND ORBITS WITHOUT CONTRAST TECHNIQUE: Multiplanar, multi-echo pulse sequences of the brain and surrounding structures were acquired without intravenous contrast. Multiplanar, multi-echo pulse sequences of the orbits and surrounding structures were acquired including fat saturation techniques, without intravenous contrast administration. COMPARISON:  None Available. FINDINGS: MRI HEAD FINDINGS Brain: No acute infarct, mass effect or extra-axial collection. No acute or chronic hemorrhage. There is multifocal hyperintense T2-weighted signal within the white matter. Parenchymal volume and CSF spaces are normal. There are old right pontine and left basal ganglia small vessel infarct. The midline  structures are normal. Vascular: Major flow voids are preserved. Skull and upper cervical spine: Normal calvarium and skull base. Visualized upper cervical spine and soft tissues are normal. Sinuses/Orbits:No paranasal sinus fluid levels or advanced mucosal thickening. No mastoid or middle ear effusion. Normal orbits. MRI ORBITS FINDINGS Orbits: The globes are intact. Optic nerves are normal. Normal extraocular muscles and lacrimal glands. The bony orbit, preseptal soft tissues and the intra- and extraconal orbital fat are normal. Visualized sinuses:  No fluid levels or advanced mucosal thickening. Soft tissues: Normal. IMPRESSION: 1. Mild chronic small vessel disease with old right pontine and left basal ganglia small vessel infarcts. 2. Normal orbits. Electronically Signed   By: Ulyses Jarred M.D.   On: 05/31/2022 23:03   MR ORBITS WO CONTRAST  Result Date: 05/31/2022 CLINICAL DATA:  Monocular vision loss EXAM: MRI HEAD AND ORBITS WITHOUT CONTRAST TECHNIQUE: Multiplanar, multi-echo pulse sequences of the brain and surrounding structures were acquired without intravenous contrast. Multiplanar, multi-echo pulse sequences of the orbits and surrounding structures were acquired including fat saturation techniques, without intravenous contrast administration. COMPARISON:  None Available. FINDINGS: MRI HEAD FINDINGS Brain: No acute infarct, mass effect or extra-axial collection. No acute or chronic hemorrhage. There is multifocal hyperintense T2-weighted signal within the white matter. Parenchymal volume and CSF spaces are normal. There are old right pontine and left basal ganglia small vessel infarct. The midline structures are normal. Vascular: Major flow voids are preserved. Skull and upper cervical spine: Normal calvarium and skull base. Visualized upper cervical spine and soft tissues are normal. Sinuses/Orbits:No paranasal sinus fluid levels or advanced mucosal thickening. No mastoid or middle ear effusion.  Normal orbits. MRI ORBITS FINDINGS Orbits: The globes are intact. Optic nerves are normal. Normal extraocular muscles and lacrimal glands. The bony orbit, preseptal soft tissues and the intra- and extraconal orbital fat are normal. Visualized sinuses:  No fluid levels or advanced mucosal thickening. Soft tissues: Normal. IMPRESSION: 1. Mild chronic small vessel disease with old right pontine and left basal ganglia small vessel infarcts. 2. Normal orbits. Electronically Signed   By: Ulyses Jarred M.D.   On: 05/31/2022 23:03   CT Head Wo Contrast  Result Date: 05/29/2022 CLINICAL DATA:  TIA dizziness EXAM: CT HEAD WITHOUT CONTRAST TECHNIQUE: Contiguous axial images were obtained from the base of the skull through the vertex without intravenous contrast. RADIATION DOSE REDUCTION: This exam was performed according to the departmental dose-optimization program which includes automated exposure control, adjustment of the mA and/or kV according to patient size and/or use of iterative reconstruction technique. COMPARISON:  04/20/2022 FINDINGS: Brain: No evidence of acute infarction, hemorrhage, cerebral edema, mass, mass effect, or midline shift. No hydrocephalus or extra-axial  fluid collection. Hypodensity in the pons and left periventricular frontal lobe correlate with the acute infarcts from 04/21/2022. Redemonstrated lacunar infarcts in the left lentiform nucleus. Vascular: No hyperdense vessel. Skull: Normal. Negative for fracture or focal lesion. Sinuses/Orbits: Small right ethmoid osteoma. No acute finding in the paranasal sinuses or orbits. Other: The mastoid air cells are well aerated. IMPRESSION: No acute intracranial process. Electronically Signed   By: Merilyn Baba M.D.   On: 05/29/2022 13:17   VAS [place] ABI WITH/WO TBI  Result Date: 05/26/2022  LOWER EXTREMITY DOPPLER STUDY Patient Name:  Erin Jackson  Date of Exam:   05/26/2022 Medical Rec #: [id number]       Accession #:    [id number] Date of Birth:  May 09, 1966       Patient Gender: F Patient Age:   41 years Exam Location:  [facility] Procedure:      VAS [place] ABI WITH/WO TBI Referring Phys: Jamelle Haring --------------------------------------------------------------------------------  Indications: Claudication, peripheral artery disease, and CTA 04/20/22:              Chronically occluded IMA, left internal iliac artery, and. High Risk Factors: Hypertension, Diabetes, current smoker, prior CVA.  Performing Technologist: Ralene Cork RVT  Examination Guidelines: A complete evaluation includes at minimum, Doppler waveform signals and systolic blood pressure reading at the level of bilateral brachial, anterior tibial, and posterior tibial arteries, when vessel segments are accessible. Bilateral testing is considered an integral part of a complete examination. Photoelectric Plethysmograph (PPG) waveforms and toe systolic pressure readings are included as required and additional duplex testing as needed. Limited examinations for reoccurring indications may be performed as noted.  ABI Findings: +---------+------------------+-----+---------+--------+ Right    Rt Pressure (mmHg)IndexWaveform Comment  +---------+------------------+-----+---------+--------+ Brachial 151                                      +---------+------------------+-----+---------+--------+ PTA      0                 0.00 absent            +---------+------------------+-----+---------+--------+ DP       151               0.96 triphasic         +---------+------------------+-----+---------+--------+ Great Toe125               0.80                   +---------+------------------+-----+---------+--------+ +---------+------------------+-----+-------------------+-------+ Left     Lt Pressure (mmHg)IndexWaveform           Comment +---------+------------------+-----+-------------------+-------+ Brachial 157                                                +---------+------------------+-----+-------------------+-------+ PTA      0                 0.00 absent                     +---------+------------------+-----+-------------------+-------+ DP       52                0.33 dampened monophasic        +---------+------------------+-----+-------------------+-------+ Rella Larve  0.00                            +---------+------------------+-----+-------------------+-------+ +-------+-----------+-----------+------------+------------+ ABI/TBIToday's ABIToday's TBIPrevious ABIPrevious TBI +-------+-----------+-----------+------------+------------+ Right  0.96       0.8        0.93        0.69         +-------+-----------+-----------+------------+------------+ Left   0.33       0          0.4         0            +-------+-----------+-----------+------------+------------+  Previous ABI on 04/08/22.  Summary: Right: Resting right ankle-brachial index is within normal range. The right toe-brachial index is normal. Left: Resting left ankle-brachial index indicates severe left lower extremity arterial disease. The left toe-brachial index is abnormal. *See table(s) above for measurements and observations.  Electronically signed by Monica Martinez MD on 05/26/2022 at 11:58:06 AM.    Final     Microbiology: No results found for this or any previous visit (from the past 240 hour(s)).   Labs: Basic Metabolic Panel: Recent Labs  Lab 06/02/22 0453 06/05/22 0210 06/08/22 0322  NA 138 137 136  K 3.4* 4.2 4.1  CL 105 106 105  CO2 _0 GLUCOSE 166* 141* 120*  BUN 25* 25* 24*  CREATININE 1.28* 1.25* 1.06*  CALCIUM 7.4* 7.4* 8.5*   Liver Function Tests: No results for input(s): "AST", "ALT", "ALKPHOS", "BILITOT", "PROT", "ALBUMIN" in the last 168 hours. No results for input(s): "LIPASE", "AMYLASE" in the last 168 hours. No results for input(s): "AMMONIA" in the last 168 hours. CBC: Recent Labs  Lab  06/02/22 0453 06/05/22 0210 06/08/22 0322  WBC 8.6 8.9 10.3  NEUTROABS  --  4.6  --   HGB 10.5* 10.0* 9.9*  HCT 32.6* 30.4* 31.4*  MCV 87.2 87.6 89.2  PLT 249 214 192   Cardiac Enzymes: No results for input(s): "CKTOTAL", "CKMB", "CKMBINDEX", "TROPONINI" in the last 168 hours. BNP: BNP (last 3 results) Recent Labs    04/20/22 1207  BNP 243.7*    ProBNP (last 3 results) No results for input(s): "PROBNP" in the last 8760 hours.  CBG: Recent Labs  Lab 06/07/22 1340 06/07/22 1610 06/07/22 2150 06/08/22 0630 06/08/22 1208  GLUCAP 329* 228* 240* 123* 136*       Signed:  Desma Maxim MD.  [facility] 06/08/2022, 12:27 PM

## 2022-06-08 NOTE — Progress Notes (Signed)
Physical Therapy Treatment Patient Details Name: Erin Jackson MRN: [id number] DOB: 10-03-1966 Today's Date: 06/08/2022   History of Present Illness Pt is a 56 y/o F presenting on 6/11 with sudden loss of vision, L sided headache.  MRI concerning for temporal arteritis.  S/P temporal artery biopsy 6/13. Noted recent admission for CVA last month. PMH of HTN, DM, Anxiety, GERD, and recent heart cath on 4/20.    PT Comments    Pt progressing towards physical therapy goals. Decreased cues required for compensation and accommodation of gait deficits. Pt expressing frustration and depression with current visual deficits but overall demonstrated a good rehab effort and was appreciative of therapy session. Will continue to follow and progress as able per POC.     Recommendations for follow up therapy are one component of a multi-disciplinary discharge planning process, led by the attending physician.  Recommendations may be updated based on patient status, additional functional criteria and insurance authorization.  Follow Up Recommendations  Acute inpatient rehab (3hours/day)     Assistance Recommended at Discharge Intermittent Supervision/Assistance  Patient can return home with the following A little help with walking and/or transfers;A little help with bathing/dressing/bathroom;Help with stairs or ramp for entrance;Assist for transportation;Assistance with Data processing manager (4 wheels)    Recommendations for Other Services       Precautions / Restrictions Precautions Precautions: Fall Restrictions Weight Bearing Restrictions: No     Mobility  Bed Mobility Overal bed mobility: Modified Independent Bed Mobility: Supine to Sit     Supine to sit: HOB elevated     General bed mobility comments: No assist required. With [facility] elevated pt was able to scoot around to EOB well. Increased time to manage linens around feet.    Transfers Overall  transfer level: Needs assistance Equipment used: Rolling walker (2 wheels) Transfers: Sit to/from Stand Sit to Stand: Supervision           General transfer comment: Increased time to power up to full stand and gain/maintain standing balance. However, no overt LOB noted.    Ambulation/Gait Ambulation/Gait assistance: Min guard Gait Distance (Feet): 150 Feet Assistive device: Rolling walker (2 wheels) Gait Pattern/deviations: Decreased stride length Gait velocity: decreased Gait velocity interpretation: 1.31 - 2.62 ft/sec, indicative of limited community ambulator   General Gait Details: Consistently veering R, eventually making accommodations with walker and angling it to the L to try and maintain a straight path. A few minor bouts of unsteadiness but pt was able to correct without assist. Fatigued quickly, reports LE's feel "heavy" today.   Stairs             Wheelchair Mobility    Modified Rankin (Stroke Patients Only)       Balance Overall balance assessment: Needs assistance Sitting-balance support: Feet supported Sitting balance-Leahy Scale: Fair     Standing balance support: Bilateral upper extremity supported, During functional activity Standing balance-Leahy Scale: Poor Standing balance comment: Furniture walking at times without RW, however required at least 1 UE support for balance during dynamic actvity.                            Cognition Arousal/Alertness: Awake/alert Behavior During Therapy: Flat affect Overall Cognitive Status: Within Functional Limits for tasks assessed  Exercises      General Comments        Pertinent Vitals/Pain Pain Assessment Pain Assessment: Faces Faces Pain Scale: Hurts a little bit Pain Descriptors / Indicators: Headache    Home Living                          Prior Function            PT Goals (current goals can now be  found in the care plan section) Acute Rehab PT Goals Patient Stated Goal: to be independent, have more help at home PT Goal Formulation: With patient Time For Goal Achievement: 06/10/22 Potential to Achieve Goals: Good Progress towards PT goals: Progressing toward goals    Frequency    Min 3X/week      PT Plan Current plan remains appropriate    Co-evaluation              AM-PAC PT "6 Clicks" Mobility   Outcome Measure  Help needed turning from your back to your side while in a flat bed without using bedrails?: None Help needed moving from lying on your back to sitting on the side of a flat bed without using bedrails?: A Little Help needed moving to and from a bed to a chair (including a wheelchair)?: A Little Help needed standing up from a chair using your arms (e.g., wheelchair or bedside chair)?: A Little Help needed to walk in hospital room?: A Little Help needed climbing 3-5 steps with a railing? : A Little 6 Click Score: 19    End of Session Equipment Utilized During Treatment: Gait belt Activity Tolerance: Patient tolerated treatment well Patient left: in chair;with call bell/phone within reach;with chair alarm set Nurse Communication: Mobility status PT Visit Diagnosis: Muscle weakness (generalized) (M62.81);Other abnormalities of gait and mobility (R26.89)     Time: 5852-7782 PT Time Calculation (min) (ACUTE ONLY): 21 min  Charges:  $Gait Training: 8-22 mins                     Erin Jackson, PT, DPT Acute Rehabilitation Services Secure Chat Preferred Office: [phone]    Erin Jackson 06/08/2022, 9:06 AM

## 2022-06-08 NOTE — Plan of Care (Signed)

## 2022-06-09 ENCOUNTER — Telehealth: Payer: Self-pay | Admitting: *Deleted

## 2022-06-09 DIAGNOSIS — I69354 Hemiplegia and hemiparesis following cerebral infarction affecting left non-dominant side: Secondary | ICD-10-CM | POA: Diagnosis not present

## 2022-06-09 DIAGNOSIS — D649 Anemia, unspecified: Secondary | ICD-10-CM | POA: Diagnosis not present

## 2022-06-09 DIAGNOSIS — I152 Hypertension secondary to endocrine disorders: Secondary | ICD-10-CM | POA: Diagnosis not present

## 2022-06-09 DIAGNOSIS — E1159 Type 2 diabetes mellitus with other circulatory complications: Secondary | ICD-10-CM | POA: Diagnosis not present

## 2022-06-09 DIAGNOSIS — M316 Other giant cell arteritis: Secondary | ICD-10-CM | POA: Diagnosis not present

## 2022-06-09 DIAGNOSIS — I679 Cerebrovascular disease, unspecified: Secondary | ICD-10-CM | POA: Diagnosis not present

## 2022-06-09 DIAGNOSIS — Z79899 Other long term (current) drug therapy: Secondary | ICD-10-CM | POA: Diagnosis not present

## 2022-06-09 DIAGNOSIS — I739 Peripheral vascular disease, unspecified: Secondary | ICD-10-CM | POA: Diagnosis not present

## 2022-06-09 DIAGNOSIS — H5440 Blindness, one eye, unspecified eye: Secondary | ICD-10-CM | POA: Diagnosis not present

## 2022-06-09 NOTE — Telephone Encounter (Signed)
Mrs Mackie called requesting to speak with Dr Dalene Carrow. She has some things she needs to discuss with her. Her number is # [phone].

## 2022-06-10 ENCOUNTER — Encounter (HOSPITAL_BASED_OUTPATIENT_CLINIC_OR_DEPARTMENT_OTHER): Payer: Medicare Other | Admitting: Physical Medicine and Rehabilitation

## 2022-06-10 DIAGNOSIS — F411 Generalized anxiety disorder: Secondary | ICD-10-CM | POA: Diagnosis not present

## 2022-06-10 DIAGNOSIS — H5440 Blindness, one eye, unspecified eye: Secondary | ICD-10-CM | POA: Diagnosis not present

## 2022-06-10 MED ORDER — ALPRAZOLAM 2 MG PO TABS: 2.0000 mg | ORAL_TABLET | Freq: Three times a day (TID) | ORAL | 3 refills | Status: DC | PRN

## 2022-06-10 NOTE — Progress Notes (Signed)
Subjective:    Patient ID: Erin Jackson, female    DOB: [birth year], 56 y.o.   MRN: [id number]  HPI Brief HPI:    1) CVA: Erin Jackson is a 56 y.o. female who presented to the emergency department complaining of generalized weakness, dizziness, headache and shortness of breath.  She was found to have elevated systolic blood pressure into the 200s and persistent tachycardia in the 130s.  I am increasingly more unresponsive she was started on nicardipine drip critical care medicine consulted.  Encephalopathy was felt most likely due to malignant hypertension versus PRES.  The following day she endorsed right facial numbness and left hand and foot numbness.  Imaging was significant for hypodensity of the right pons and she was started on antiplatelet therapy with Plavix.  Her MRI was consistent with right pontine infarct and punctate left hypocampus and frontal white matter infarcts.   Hospital Course: Erin Jackson was admitted to rehab 04/29/2022 for inpatient therapies to consist of PT, ST and OT at least three hours five days a week. Past admission physiatrist, therapy team and rehab RN have worked together to provide customized collaborative inpatient rehab.  The patient remained quite dissatisfied with diet restrictions, particularly seasonings.  We discussed salt and carbohydrate intake and over time how not adhering to these restrictions may increase stroke risk.  She relayed how her blood sugars have historically had high and low swings.  We decided to ease the dietary restrictions and continue to closely monitor and ask her to pay attention to appropriate food choices.  Developed bothersome urinary incontinence on 5/15 and bladder program was initiated.  She also underwent neuropsychology evaluation on that day. Magnesium gluconate 250 mg started at bedtime on and restarted clonidine 0.1 mg BID. Lyrica increased to 75 mg daily. Patient is refusing prescriptions for [place] and Zetia. Refused  further magnesium IV on 5/19.    2) HTN Blood pressures were monitored on TID basis and remained well controlled on Lopressor 50 mg twice daily.  This dose was increased to 100 mg twice daily on 5/14. Home clonidine 0.1 mg BID started on 5/17.   3) Diabetes has been monitored with ac/hs CBG checks and SSI was use prn for tighter BS control.  Metformin 500 mg twice daily as well as Farxiga 5 mg daily continued. Metformin increase to 1000 mg BID.  4) Temporal arteritis -blindness has not improved -she was discharged to [facility] -she is to be discharged on Friday  5) Anxiety -she asks for refill of her Xanax -[facility] is referring her to a new PCP -she takes 2mg  up to three times per day     Rehab course: During patient's stay in rehab weekly team conferences were held to monitor patient's progress, set goals and discuss barriers to discharge. At admission, patient required supervision/verbal cuing, min assist for mobility and transfers.   She  has had improvement in activity tolerance, balance, postural control as well as ability to compensate for deficits. She has had improvement in functional use RUE/LUE  and RLE/LLE as well as improvement in awareness   Discussed possible use of Topamax to help with lower extremity pain. Will continue Lyrica at night at time of discharge and she will contact Dr. should she decide she needs this additional medicine.   HPI interval history 56 year old female with past medical history of diabetes mellitus type 2 bipolar disorder hypertension stage IIIb CKD chronic systolic heart failure, PAD, obesity with with recent  right pontine CVA and punctate left hippocampal and frontal white matter infarcts here for follow-up after her recent rehabilitation at [facility].  Patient reports she has most of her medications although she is not able to provide their names.  She does report that she is running low on the Robaxin.  She reports working with PT and OT  at home with continued improvement.  She reports she is continuing her PPI for GERD.  She reports she is having more swelling in her arms and legs since her discharge.  She could not recall if she is taking Lyrica or not.  She also reports some chest wall tenderness for the past several days.  Her blood pressure is elevated but she says she did not take her BP medications this morning.  She reports her glucose has been ranging from 110-170.  She reports she will follow-up with her new PCP tomorrow.  She would like a rollator with a seat because she feels like she gets fatigued during activities and needs to sit down.      06/08/2022   12:10 PM 06/08/2022    8:12 AM 06/08/2022    3:31 AM  Vitals with BMI  Systolic [id number]  Diastolic 75 88 80  Pulse 97 108 90     Pain Inventory Average Pain 9 Pain Right Now 8 My pain is constant, sharp, burning, stabbing, tingling, and aching  LOCATION OF PAIN  neck, shoulder, back, hip, thigh, leg, ankle, toes  BOWEL Number of stools per week: 7  BLADDER Normal  Mobility use a walker how many minutes can you walk? 2-3 minutes ability to climb steps?  no do you drive?  no  Function Do you have any goals in this area?  no  Neuro/Psych numbness tingling trouble walking dizziness depression anxiety  Prior Studies Any changes since last visit?  no  Physicians involved in your care Any changes since last visit?  no   Family History  Problem Relation Age of Onset   Breast cancer Maternal Grandmother    Breast cancer Maternal Aunt    Social History   Socioeconomic History   Marital status: Divorced    Spouse name: Not on file   Number of children: Not on file   Years of education: Not on file   Highest education level: Not on file  Occupational History   Occupation: disabled  Tobacco Use   Smoking status: Former    Packs/day: 0.50    Types: Cigarettes    Quit date: 04/21/2022    Years since quitting: 0.1   Smokeless  tobacco: Never  Vaping Use   Vaping Use: Never used  Substance and Sexual Activity   Alcohol use: Yes    Comment: socially   Drug use: Not Currently    Types: Marijuana    Comment: occasional   Sexual activity: Never  Other Topics Concern   Not on file  Social History Narrative   Not on file   Social Determinants of Health   Financial Resource Strain: Not on file  Food Insecurity: Not on file  Transportation Needs: Not on file  Physical Activity: Not on file  Stress: Not on file  Social Connections: Not on file   Past Surgical History:  Procedure Laterality Date   ABDOMINAL HYSTERECTOMY     ARTERY BIOPSY Left 06/02/2022   Procedure: BIOPSY TEMPORAL ARTERY;  Surgeon: Waynetta Sandy, MD;  Location: [facility];  Service: Vascular;  Laterality: Left;   BREAST LUMPECTOMY  Left 03/25/2022   Procedure: LEFT BREAST LUMPECTOMY;  Surgeon: Griselda Miner, MD;  Location: [facility];  Service: General;  Laterality: Left;   BREAST REDUCTION SURGERY Bilateral 08/15/2018   Procedure: BILATERAL MAMMARY REDUCTION  (BREAST);  Surgeon: Louisa Second, MD;  Location: [facility];  Service: Plastics;  Laterality: Bilateral;   BREAST REDUCTION SURGERY Left 09/25/2019   Procedure: EXCISION OF FAT NECROSIS OF LEFT BREAST;  Surgeon: Louisa Second, MD;  Location: [facility];  Service: Plastics;  Laterality: Left;   CARPAL TUNNEL RELEASE Right 06/17/2020   right thumb CMC arthroplasty with tendon transfer and right carpal tunnel release.    CERVICAL SPINE SURGERY  2015   has a metal plate   FOOT SURGERY Left 2014   HAND SURGERY     LEFT HEART CATH AND CORONARY ANGIOGRAPHY N/A 04/09/2022   Procedure: LEFT HEART CATH AND CORONARY ANGIOGRAPHY;  Surgeon: Orpah Cobb, MD;  Location: [facility];  Service: Cardiovascular;  Laterality: N/A;   REDUCTION MAMMAPLASTY Bilateral 07/2018   Past Medical History:  Diagnosis Date   Anxiety    Chronic  back pain    Diabetes mellitus without complication (HCC)    GERD (gastroesophageal reflux disease)    Hypertension    There were no vitals taken for this visit.  Opioid Risk Score:   Fall Risk Score:  `1  Depression screen [facility] 2/9     05/19/2022   10:38 AM 08/20/2017    9:29 AM  Depression screen PHQ 2/9  Decreased Interest 1 0  Down, Depressed, Hopeless 2 0  PHQ - 2 Score 3 0  Altered sleeping 3   Tired, decreased energy 0   Change in appetite 0   Feeling bad or failure about yourself  1   Trouble concentrating 3   Moving slowly or fidgety/restless 3   Suicidal thoughts 0   PHQ-9 Score 13     Review of Systems  Constitutional:  Positive for unexpected weight change.       Weight gain  Respiratory:  Positive for cough and shortness of breath.   Cardiovascular:  Positive for leg swelling.  Musculoskeletal:  Positive for back pain, gait problem and neck pain.  Neurological:  Positive for dizziness.       Tingling  Psychiatric/Behavioral:         Depression, anxiety  All other systems reviewed and are negative.      Objective:   Physical Exam  Not performed since patient was seen via phone    Assessment & Plan:   Right pontine infarct and punctate left hippocampus and frontal white matter infarcts. Neurology suspects cardioembolic vs small vessel disease source. 30 Day cardionet monitoring to rule out afib -Continue home PT OT -Continue Plavix -We will refill Robaxin for muscle spasms -Continue statin -Order placed for rollator with a seat  2. Chronic pain -She is currently on hydrocodone from a outside pain provider -We will discontinue her Lyrica, this appears to be a recently added medication, possibly causing some of her swelling, also unsure if it is providing any benefit   3. Hypertension elevated blood pressure today, she did not take her medications -Continue clonidine, metoprolol -Advised her to bring her medications to her PCP tomorrow, discussed  importance of taking all her medications   4. Diabetes mellitus type 2 -Continue metformin -Discussed importance of diet -Advised her to follow-up with her PCP, bring all your medications tomorrow to have your PCP with  glucose logs if possible  5. CAD -Advised to go to the ED if she develops new or worsening chest pain   6. PAD: seen in hospital consultation by Dr. Stanford Breed 04/09/2022 -Advised her to continue her outpatient follow-up  7: Tobacco use -Reports she is smoking 1 cigarette a day.  I advised her to quit.  I offered her medications to help quit but she declined.  8) Blindness secondary to suspected temporal arteritis -discussed that her deficits have remained, but she is better handing her blindness emotionally  9) Anxiety -refilled Xanax 2mg  TID prn -encouraged follow-up with PCP  12 minutes spent in discussion of her anxiety and new onset blindness, that her blindness has not improved, but she is in an emotionally better place, that she is in [place] right now with expected discharge on Friday         8. chest wall pain -Suspect from PT and use of walker -We will order lidocaine patch

## 2022-06-11 NOTE — Telephone Encounter (Signed)
Pt called stating sh eis returning your call

## 2022-06-12 DIAGNOSIS — I152 Hypertension secondary to endocrine disorders: Secondary | ICD-10-CM | POA: Diagnosis not present

## 2022-06-12 DIAGNOSIS — N1832 Chronic kidney disease, stage 3b: Secondary | ICD-10-CM | POA: Diagnosis not present

## 2022-06-12 DIAGNOSIS — R600 Localized edema: Secondary | ICD-10-CM | POA: Diagnosis not present

## 2022-06-12 DIAGNOSIS — Z7189 Other specified counseling: Secondary | ICD-10-CM | POA: Diagnosis not present

## 2022-06-12 DIAGNOSIS — E1159 Type 2 diabetes mellitus with other circulatory complications: Secondary | ICD-10-CM | POA: Diagnosis not present

## 2022-06-14 ENCOUNTER — Emergency Department (HOSPITAL_COMMUNITY): Payer: Medicare Other

## 2022-06-14 ENCOUNTER — Emergency Department (HOSPITAL_COMMUNITY)
Admission: EM | Admit: 2022-06-14 | Discharge: 2022-06-15 | Disposition: A | Discharge status: Skilled Nursing Facility | Payer: Medicare Other | Attending: Emergency Medicine | Admitting: Emergency Medicine

## 2022-06-14 ENCOUNTER — Other Ambulatory Visit: Payer: Self-pay

## 2022-06-14 DIAGNOSIS — Z7984 Long term (current) use of oral hypoglycemic drugs: Secondary | ICD-10-CM | POA: Insufficient documentation

## 2022-06-14 DIAGNOSIS — R531 Weakness: Secondary | ICD-10-CM

## 2022-06-14 DIAGNOSIS — I10 Essential (primary) hypertension: Secondary | ICD-10-CM | POA: Diagnosis not present

## 2022-06-14 DIAGNOSIS — Z20822 Contact with and (suspected) exposure to covid-19: Secondary | ICD-10-CM | POA: Diagnosis not present

## 2022-06-14 DIAGNOSIS — I3139 Other pericardial effusion (noninflammatory): Secondary | ICD-10-CM | POA: Diagnosis not present

## 2022-06-14 DIAGNOSIS — Z981 Arthrodesis status: Secondary | ICD-10-CM | POA: Diagnosis not present

## 2022-06-14 DIAGNOSIS — R112 Nausea with vomiting, unspecified: Secondary | ICD-10-CM | POA: Diagnosis not present

## 2022-06-14 DIAGNOSIS — K449 Diaphragmatic hernia without obstruction or gangrene: Secondary | ICD-10-CM | POA: Diagnosis not present

## 2022-06-14 DIAGNOSIS — I6503 Occlusion and stenosis of bilateral vertebral arteries: Secondary | ICD-10-CM | POA: Diagnosis not present

## 2022-06-14 DIAGNOSIS — Z7902 Long term (current) use of antithrombotics/antiplatelets: Secondary | ICD-10-CM | POA: Diagnosis not present

## 2022-06-14 DIAGNOSIS — E119 Type 2 diabetes mellitus without complications: Secondary | ICD-10-CM | POA: Diagnosis not present

## 2022-06-14 DIAGNOSIS — I771 Stricture of artery: Secondary | ICD-10-CM | POA: Diagnosis not present

## 2022-06-14 DIAGNOSIS — Z79899 Other long term (current) drug therapy: Secondary | ICD-10-CM | POA: Diagnosis not present

## 2022-06-14 DIAGNOSIS — R2990 Unspecified symptoms and signs involving the nervous system: Secondary | ICD-10-CM

## 2022-06-14 DIAGNOSIS — R42 Dizziness and giddiness: Secondary | ICD-10-CM | POA: Insufficient documentation

## 2022-06-14 DIAGNOSIS — I517 Cardiomegaly: Secondary | ICD-10-CM | POA: Diagnosis not present

## 2022-06-14 DIAGNOSIS — R519 Headache, unspecified: Secondary | ICD-10-CM | POA: Diagnosis not present

## 2022-06-14 DIAGNOSIS — J9811 Atelectasis: Secondary | ICD-10-CM | POA: Diagnosis not present

## 2022-06-14 DIAGNOSIS — Z794 Long term (current) use of insulin: Secondary | ICD-10-CM | POA: Diagnosis not present

## 2022-06-14 DIAGNOSIS — R0602 Shortness of breath: Secondary | ICD-10-CM | POA: Insufficient documentation

## 2022-06-14 LAB — URINALYSIS, ROUTINE W REFLEX MICROSCOPIC
Bilirubin Urine: NEGATIVE
Glucose, UA: NEGATIVE mg/dL
Ketones, ur: NEGATIVE mg/dL
Leukocytes,Ua: NEGATIVE
Nitrite: NEGATIVE
Protein, ur: 100 mg/dL — AB
Specific Gravity, Urine: 1.008 (ref 1.005–1.030)
pH: 6 (ref 5.0–8.0)

## 2022-06-14 LAB — PROTIME-INR
INR: 1 (ref 0.8–1.2)
Prothrombin Time: 13.4 seconds (ref 11.4–15.2)

## 2022-06-14 LAB — CBC WITH DIFFERENTIAL/PLATELET
Abs Immature Granulocytes: 0.05 10*3/uL (ref 0.00–0.07)
Basophils Absolute: 0 10*3/uL (ref 0.0–0.1)
Basophils Relative: 0 %
Eosinophils Absolute: 0.1 10*3/uL (ref 0.0–0.5)
Eosinophils Relative: 1 %
HCT: 33.3 % — ABNORMAL LOW (ref 36.0–46.0)
Hemoglobin: 10.4 g/dL — ABNORMAL LOW (ref 12.0–15.0)
Immature Granulocytes: 0 %
Lymphocytes Relative: 33 %
Lymphs Abs: 3.7 10*3/uL (ref 0.7–4.0)
MCH: 28.3 pg (ref 26.0–34.0)
MCHC: 31.2 g/dL (ref 30.0–36.0)
MCV: 90.5 fL (ref 80.0–100.0)
Monocytes Absolute: 0.8 10*3/uL (ref 0.1–1.0)
Monocytes Relative: 7 %
Neutro Abs: 6.5 10*3/uL (ref 1.7–7.7)
Neutrophils Relative %: 59 %
Platelets: 242 10*3/uL (ref 150–400)
RBC: 3.68 MIL/uL — ABNORMAL LOW (ref 3.87–5.11)
RDW: 14.6 % (ref 11.5–15.5)
WBC: 11.2 10*3/uL — ABNORMAL HIGH (ref 4.0–10.5)
nRBC: 0 % (ref 0.0–0.2)

## 2022-06-14 LAB — APTT: aPTT: 28 seconds (ref 24–36)

## 2022-06-14 LAB — RAPID URINE DRUG SCREEN, HOSP PERFORMED
Amphetamines: NOT DETECTED
Barbiturates: NOT DETECTED
Benzodiazepines: POSITIVE — AB
Cocaine: NOT DETECTED
Opiates: POSITIVE — AB
Tetrahydrocannabinol: NOT DETECTED

## 2022-06-14 LAB — BASIC METABOLIC PANEL
Anion gap: 12 (ref 5–15)
BUN: 15 mg/dL (ref 6–20)
CO2: 23 mmol/L (ref 22–32)
Calcium: 7.1 mg/dL — ABNORMAL LOW (ref 8.9–10.3)
Chloride: 104 mmol/L (ref 98–111)
Creatinine, Ser: 1.18 mg/dL — ABNORMAL HIGH (ref 0.44–1.00)
GFR, Estimated: 54 mL/min — ABNORMAL LOW (ref >60)
Glucose, Bld: 117 mg/dL — ABNORMAL HIGH (ref 70–99)
Potassium: 4.9 mmol/L (ref 3.5–5.1)
Sodium: 139 mmol/L (ref 135–145)

## 2022-06-14 LAB — ETHANOL: Alcohol, Ethyl (B): <10 mg/dL (ref <10)

## 2022-06-14 LAB — RESP PANEL BY RT-PCR (FLU A&B, COVID) ARPGX2
Influenza A by PCR: NEGATIVE
Influenza B by PCR: NEGATIVE
SARS Coronavirus 2 by RT PCR: NEGATIVE

## 2022-06-14 MED ORDER — METOCLOPRAMIDE HCL 5 MG/ML IJ SOLN
10.0000 mg | Freq: Once | INTRAMUSCULAR | Status: AC
  Administered 2022-06-14: 10 mg via INTRAVENOUS
  Filled 2022-06-14: qty 2

## 2022-06-14 MED ORDER — LORAZEPAM 2 MG/ML IJ SOLN: 0.5000 mg | INTRAMUSCULAR | Status: DC | PRN

## 2022-06-14 MED ORDER — DIPHENHYDRAMINE HCL 50 MG/ML IJ SOLN
12.5000 mg | Freq: Once | INTRAMUSCULAR | Status: AC
  Administered 2022-06-14: 12.5 mg via INTRAVENOUS
  Filled 2022-06-14: qty 1

## 2022-06-14 MED ORDER — MECLIZINE HCL 25 MG PO TABS
25.0000 mg | ORAL_TABLET | Freq: Once | ORAL | Status: AC
  Administered 2022-06-14: 25 mg via ORAL
  Filled 2022-06-14: qty 1

## 2022-06-14 MED ORDER — LORAZEPAM 0.5 MG PO TABS: 0.5000 mg | ORAL_TABLET | ORAL | Status: DC | PRN

## 2022-06-14 MED ORDER — LORAZEPAM 2 MG/ML IJ SOLN
1.0000 mg | INTRAMUSCULAR | Status: DC | PRN
  Administered 2022-06-15: 1 mg via INTRAVENOUS
  Filled 2022-06-14: qty 1

## 2022-06-14 MED ORDER — ACETAMINOPHEN 500 MG PO TABS
1000.0000 mg | ORAL_TABLET | Freq: Once | ORAL | Status: AC
  Administered 2022-06-14: 1000 mg via ORAL
  Filled 2022-06-14: qty 2

## 2022-06-14 NOTE — ED Provider Notes (Signed)
[facility] DEPT Provider Note   CSN: [id number] Arrival date & time: 06/14/22  2013     History  No chief complaint on file.   Erin Jackson is a 56 y.o. female.  HPI  56 year old female with medical history significant for DM 2, HTN, GERD, previous right-sided pontine stroke on Plavix, recent diagnosis of left eye vision loss due to suspected giant cell arteritis who presents emergency department with roughly 2 to 3 days of vertiginous symptoms with room spinning dizziness, worsening left hemibody weakness.  The patient has residual left hemibody weakness since her prior CVA.  She states that over the last 2 to 3 days she has noticed an increase in weakness in the left hemibody.  She has had persistent room spinning dizziness during that time as well. She endorses a generalized headache.   Additionally, the patient states that over the past day she has had bad reflux/heartburn symptoms with burning in her chest with no radiation.  She endorses mild shortness of breath.  She denies any cough, fever or chills.  Home Medications Prior to Admission medications   Medication Sig Start Date End Date Taking? Authorizing Provider  acetaminophen (TYLENOL) 325 MG tablet Take 2 tablets (650 mg total) by mouth every 4 (four) hours as needed for fever or mild pain. 05/07/22  Yes Setzer, Edman Circle, PA-C  alprazolam Duanne Moron) 2 MG tablet Take 1 tablet (2 mg total) by mouth 3 (three) times daily as needed for anxiety. Patient taking differently: Take 1 mg by mouth 2 (two) times daily. 06/10/22  Yes Raulkar, Clide Deutscher, MD  atorvastatin (LIPITOR) 80 MG tablet Take 1 tablet (80 mg total) by mouth daily. Patient taking differently: Take 80 mg by mouth at bedtime. 05/07/22  Yes Setzer, Edman Circle, PA-C  cetirizine (ZYRTEC ALLERGY) 10 MG tablet Take 1 tablet (10 mg total) by mouth daily. Patient taking differently: Take 10 mg by mouth daily as needed for allergies. 04/14/22  Yes Valarie Merino, MD  cloNIDine (CATAPRES) 0.1 MG tablet Take 1 tablet (0.1 mg total) by mouth 2 (two) times daily. Patient taking differently: Take 0.2 mg by mouth 2 (two) times daily. 05/07/22  Yes Setzer, Edman Circle, PA-C  clopidogrel (PLAVIX) 75 MG tablet Take 1 tablet (75 mg total) by mouth daily. 05/07/22  Yes Setzer, Edman Circle, PA-C  EPINEPHRINE 0.3 mg/0.3 mL IJ SOAJ injection Inject 0.3 mLs (0.3 mg total) into the muscle once as needed (allergic reaction). Patient taking differently: Inject 0.3 mg into the muscle as needed for anaphylaxis (allergic reaction). 10/04/18  Yes Fabian November, MD  ferrous gluconate (FERGON) 324 MG tablet Take 324 mg by mouth every other day.   Yes [provider]  HYDROcodone-acetaminophen (NORCO) 10-325 MG tablet Take 1 tablet by mouth 3 (three) times daily as needed for moderate pain. 06/08/22  Yes Wouk, Ailene Rud, MD  insulin lispro (HUMALOG) 100 UNIT/ML injection Inject 2-12 Units into the skin 2 (two) times daily. Per sliding scale, if 70 to 200= 0 units, 201 to 250= 2 units, 251 to 300= 4 units 301 to 350= 6 units, 351 to 400= 8 units, 401 to 450= 10 units, 451 to 600= 12 units   Yes [provider]  losartan (COZAAR) 100 MG tablet Take 1 tablet (100 mg total) by mouth daily. 06/09/22  Yes Wouk, Ailene Rud, MD  meclizine (ANTIVERT) 12.5 MG tablet Take 1 tablet (12.5 mg total) by mouth 2 (two) times daily as needed for  dizziness. 05/07/22  Yes Setzer, Edman Circle, PA-C  meclizine (ANTIVERT) 25 MG tablet Take 1 tablet (25 mg total) by mouth 3 (three) times daily as needed for dizziness. 06/15/22  Yes Valarie Merino, MD  metFORMIN (GLUCOPHAGE) 500 MG tablet Take 1 tablet (500 mg total) by mouth 2 (two) times daily with a meal. 05/08/22  Yes Setzer, Edman Circle, PA-C  methocarbamol (ROBAXIN) 500 MG tablet Take 1 tablet (500 mg total) by mouth every 6 (six) hours as needed for muscle spasms. 05/19/22  Yes Jennye Boroughs, MD  NARCAN 4 MG/0.1ML LIQD nasal spray kit  Place 0.4 mg into the nose as needed (opoid overdose). 01/08/20  Yes [provider]  nitroGLYCERIN (NITROSTAT) 0.4 MG SL tablet Place 1 tablet (0.4 mg total) under the tongue every 5 (five) minutes as needed for chest pain. 04/10/22  Yes Elgergawy, Silver Huguenin, MD  omeprazole (PRILOSEC) 20 MG capsule Take 20 mg by mouth 2 (two) times daily. 05/09/22  Yes [provider]  ondansetron (ZOFRAN) 4 MG tablet Take 1 tablet (4 mg total) by mouth every 6 (six) hours. 06/15/22  Yes Valarie Merino, MD  predniSONE (DELTASONE) 10 MG tablet Take 6 tablets (60 mg total) by mouth daily. Patient instructed to take Prednisone 57m by mouth Daily until stopped by PCP/Rheumatology (patient will likely need prolonged course for 3-5 months) 06/15/22 07/15/22 Yes Messick, PWallis Bamberg MD  prochlorperazine (COMPAZINE) 5 MG tablet Take 1-2 tablets (5-10 mg total) by mouth every 6 (six) hours as needed for nausea. Patient taking differently: Take 5 mg by mouth every 6 (six) hours as needed for nausea. 05/07/22  Yes Setzer, SEdman Circle PA-C  traZODone (DESYREL) 150 MG tablet Take 1 tablet (150 mg total) by mouth at bedtime. 05/07/22  Yes Setzer, SEdman Circle PA-C  Vitamin D, Ergocalciferol, (DRISDOL) 1.25 MG (50000 UNIT) CAPS capsule Take 1 capsule (50,000 Units total) by mouth every 7 (seven) days. Patient taking differently: Take 50,000 Units by mouth every 7 (seven) days. Saturday 05/08/22  Yes Setzer, SEdman Circle PA-C  allopurinol (ZYLOPRIM) 100 MG tablet Take 100 mg by mouth daily. Patient not taking: Reported on 06/15/2022 05/03/22   [provider]  hydrOXYzine (ATARAX) 25 MG tablet Take 25 mg by mouth 3 (three) times daily. Patient not taking: Reported on 06/15/2022 04/17/22   [provider]  insulin glargine (LANTUS) 100 UNIT/ML injection Inject 0.25 mLs (25 Units total) into the skin at bedtime. Patient not taking: Reported on 06/15/2022 06/08/22   Wouk, NAilene Rud MD  Lancets (ONETOUCH DELICA PLUS  [id number] MEllendaleApply topically daily. 05/03/22   [provider]  [facility] test strip daily. 05/03/22   [provider]      Allergies    Cucumber extract, Molds & smuts, Norvasc [amlodipine], Other, Apple juice, Aspirin, Ibuprofen, and Niacin and related    Review of Systems   Review of Systems  Respiratory:  Positive for shortness of breath.   Neurological:  Positive for weakness, numbness and headaches.  All other systems reviewed and are negative.   Physical Exam Updated Vital Signs BP (!) 173/117 (BP Location: Left Arm)   Pulse (!) 114   Temp 98.1 F (36.7 C) (Oral)   Resp 18   Ht '5\' 2"'  (1.575 m)   Wt 90.1 kg   SpO2 100%   BMI 36.33 kg/m  Physical Exam Vitals and nursing note reviewed.  Constitutional:      General: She is not in acute distress.  Appearance: She is well-developed.  HENT:     Head: Normocephalic and atraumatic.  Eyes:     Conjunctiva/sclera: Conjunctivae normal.  Cardiovascular:     Rate and Rhythm: Regular rhythm. Tachycardia present.  Pulmonary:     Effort: Pulmonary effort is normal. No respiratory distress.     Breath sounds: Normal breath sounds.  Abdominal:     Palpations: Abdomen is soft.     Tenderness: There is no abdominal tenderness.  Musculoskeletal:        General: No swelling.     Cervical back: Neck supple.     Right lower leg: No edema.     Left lower leg: No edema.  Skin:    General: Skin is warm and dry.     Capillary Refill: Capillary refill takes less than 2 seconds.  Neurological:     Mental Status: She is alert.     Comments: MENTAL STATUS EXAM:    Orientation: Alert and oriented to person, place and time.  Memory: Cooperative, follows commands well.  Language: Speech is clear and language is normal.   CRANIAL NERVES:    CN 2 (Optic): Vision loss, complete on the left CN 3,4,6 (EOM): Left pupil minimally reactive. Right pupil reactive to light. Full extraocular eye movement without  nystagmus.  CN 5 (Trigeminal): Facial sensation is normal, no weakness of masticatory muscles.  CN 7 (Facial): No facial weakness or asymmetry.  CN 8 (Auditory): Auditory acuity grossly normal.  CN 9,10 (Glossophar): The uvula is midline, the palate elevates symmetrically.  CN 11 (spinal access): Normal sternocleidomastoid and trapezius strength.  CN 12 (Hypoglossal): The tongue is midline. No atrophy or fasciculations.Marland Kitchen   MOTOR:  Muscle Strength: 5/5RUE, 4/5LUE, 5/5RLE, 4/5LLE.   COORDINATION:   Intact finger-to-nose, no tremor.   SENSATION:   Intact to light touch all four extremities.  GAIT: Gait  not assessed   Psychiatric:        Mood and Affect: Mood normal.     ED Results / Procedures / Treatments   Labs (all labs ordered are listed, but only abnormal results are displayed) Labs Reviewed  BASIC METABOLIC PANEL - Abnormal; Notable for the following components:      Result Value   Glucose, Bld 117 (*)    Creatinine, Ser 1.18 (*)    Calcium 7.1 (*)    GFR, Estimated 54 (*)    All other components within normal limits  URINALYSIS, ROUTINE W REFLEX MICROSCOPIC - Abnormal; Notable for the following components:   Color, Urine STRAW (*)    Hgb urine dipstick SMALL (*)    Protein, ur 100 (*)    Bacteria, UA FEW (*)    All other components within normal limits  RAPID URINE DRUG SCREEN, HOSP PERFORMED - Abnormal; Notable for the following components:   Opiates POSITIVE (*)    Benzodiazepines POSITIVE (*)    All other components within normal limits  CBC WITH DIFFERENTIAL/PLATELET - Abnormal; Notable for the following components:   WBC 11.2 (*)    RBC 3.68 (*)    Hemoglobin 10.4 (*)    HCT 33.3 (*)    All other components within normal limits  SEDIMENTATION RATE - Abnormal; Notable for the following components:   Sed Rate 85 (*)    All other components within normal limits  C-REACTIVE PROTEIN - Abnormal; Notable for the following components:   CRP 9.8 (*)    All other  components within normal limits  RESP PANEL BY RT-PCR (FLU  A&B, COVID) ARPGX2  ETHANOL  PROTIME-INR  APTT  BRAIN NATRIURETIC PEPTIDE  TROPONIN I (HIGH SENSITIVITY)  TROPONIN I (HIGH SENSITIVITY)    EKG EKG Interpretation  Date/Time:  Sunday June 14 2022 20:25:59 EDT Ventricular Rate:  120 PR Interval:  131 QRS Duration: 86 QT Interval:  327 QTC Calculation: 462 R Axis:   26 Text Interpretation: Sinus tachycardia Confirmed by Regan Lemming ([id number]) on 06/14/2022 8:47:36 PM  Radiology MR BRAIN WO CONTRAST  Result Date: 06/15/2022 CLINICAL DATA:  Neuro deficit with acute stroke suspected. Dizziness and headache with nausea and vomiting for 3 days EXAM: MRI HEAD WITHOUT CONTRAST TECHNIQUE: Multiplanar, multiecho pulse sequences of the brain and surrounding structures were obtained without intravenous contrast. COMPARISON:  Head CT and CTA from earlier today. Brain MRI 04/21/2022 FINDINGS: Brain: No acute infarction, hemorrhage, hydrocephalus, extra-axial collection or mass lesion. Chronic lacunes in the right pons and deep cerebral white matter asymmetric to the left. No acute infarct when accounting for shine through at a lacune lateral to the frontal horn of the left lateral ventricle. Two tiny remote bilateral cerebellar infarcts. Vascular: Major flow voids are preserved Skull and upper cervical spine: No focal marrow lesion.  C5-6 ACDF Sinuses/Orbits: Negative. IMPRESSION: 1. No acute finding. 2. Chronic lacunar infarcts. Electronically Signed   By: Jorje Guild M.D.   On: 06/15/2022 07:28   CT ANGIO HEAD NECK W WO CM  Result Date: 06/15/2022 CLINICAL DATA:  Dizziness, headache, and nausea and vomiting with weakness on the left side. EXAM: CT ANGIOGRAPHY HEAD AND NECK TECHNIQUE: Multidetector CT imaging of the head and neck was performed using the standard protocol during bolus administration of intravenous contrast. Multiplanar CT image reconstructions and MIPs were obtained to evaluate  the vascular anatomy. Carotid stenosis measurements (when applicable) are obtained utilizing NASCET criteria, using the distal internal carotid diameter as the denominator. RADIATION DOSE REDUCTION: This exam was performed according to the departmental dose-optimization program which includes automated exposure control, adjustment of the mA and/or kV according to patient size and/or use of iterative reconstruction technique. CONTRAST:  40m OMNIPAQUE IOHEXOL 350 MG/ML SOLN, 734mOMNIPAQUE IOHEXOL 350 MG/ML SOLN COMPARISON:  Head CT from yesterday FINDINGS: CTA NECK FINDINGS Aortic arch: Negative Right carotid system: ICA tortuosity with retropharyngeal course. Atheromatous plaque at the bifurcation without ulceration or significant stenosis. Left carotid system: Atheromatous calcification of the distal common carotid and at the bifurcation. Partial retropharyngeal course. No significant stenosis of the common or internal carotid arteries. There is moderate narrowing of the proximal ECA. No ulceration or beading. Vertebral arteries: No proximal subclavian stenosis. Diminutive vertebral arteries in the setting of fetal type bilateral PCA, especially on the left. Although detail is diminished by soft tissue attenuation and bolus density, there is right vertebral origin stenosis of at least 50%. No dissection or beading. Skeleton: C5-6 ACDF with solid arthrodesis. Other neck: No acute or inflammatory changes. Upper chest: Reported on dedicated chest CT Review of the MIP images confirms the above findings CTA HEAD FINDINGS Anterior circulation: Calcified plaque at the carotid siphons, no major branch occlusion, beading, or flow limiting stenosis. Negative for aneurysm or vascular malformation Posterior circulation: Dominant right vertebral artery. Small vertebral and basilar arteries in the setting of fetal type bilateral PCA flow. Superimposed mid basilar stenosis which is high-grade. No branch occlusion, beading, or  aneurysm. Venous sinuses: Diffusely patent Anatomic variants: As above Review of the MIP images confirms the above findings IMPRESSION: 1. No acute finding. 2. 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 3. Carotid atherosclerosis without flow limiting stenosis. 4. Approximately 50% narrowing at the origin of the dominant right vertebral artery. Electronically Signed   By: Jorje Guild M.D.   On: 06/15/2022 04:15   CT Angio Chest PE W and/or Wo Contrast  Result Date: 06/15/2022 CLINICAL DATA:  Pulmonary embolism suspected, high probability. Dizziness, headache, nausea and vomiting for 3 days. Left-sided weakness. EXAM: CT ANGIOGRAPHY CHEST WITH CONTRAST TECHNIQUE: Multidetector CT imaging of the chest was performed using the standard protocol during bolus administration of intravenous contrast. Multiplanar CT image reconstructions and MIPs were obtained to evaluate the vascular anatomy. RADIATION DOSE REDUCTION: This exam was performed according to the departmental dose-optimization program which includes automated exposure control, adjustment of the mA and/or kV according to patient size and/or use of iterative reconstruction technique. CONTRAST:  60m OMNIPAQUE IOHEXOL 350 MG/ML SOLN, 784mOMNIPAQUE IOHEXOL 350 MG/ML SOLN COMPARISON:  04/20/2022. FINDINGS: Cardiovascular: The heart is enlarged and there is a small pericardial effusion. There is mild atherosclerotic calcification of the aorta without evidence of aneurysm. The pulmonary trunk is normal in caliber. No definite evidence of pulmonary embolism. Evaluation is limited due to respiratory motion artifact. Mediastinum/Nodes: Stable prominent lymph nodes are present in the mediastinum. No hilar or axillary lymphadenopathy. The thyroid gland, trachea, and esophagus are within normal limits. There is a small hiatal hernia. Lungs/Pleura: Atelectasis is present bilaterally. No consolidation, effusion, or pneumothorax.  Upper Abdomen: No acute abnormality. Musculoskeletal: Degenerative changes are present in the thoracic spine. No acute osseous abnormality. Review of the MIP images confirms the above findings. IMPRESSION: 1. No definite evidence of pulmonary embolism. Examination is limited due to respiratory motion artifact. 2. Scattered atelectasis bilaterally with no acute process. 3. Cardiomegaly with small pericardial effusion. 4. Aortic atherosclerosis. Electronically Signed   By: LaBrett Fairy.D.   On: 06/15/2022 04:12   DG Chest Portable 1 View  Result Date: 06/14/2022 CLINICAL DATA:  History of CHF.  Dizziness and headache. EXAM: PORTABLE CHEST 1 VIEW COMPARISON:  Chest x-ray 04/22/2022 FINDINGS: Heart is borderline enlarged. There is linear atelectasis or scarring in the left mid lung, unchanged. The lungs are otherwise clear. No pleural effusion or pneumothorax. No acute fracture. Cervical spinal fusion plate again seen. IMPRESSION: No active disease. Electronically Signed   By: AmRonney Asters.D.   On: 06/14/2022 23:41   CT HEAD WO CONTRAST  Result Date: 06/14/2022 CLINICAL DATA:  Dizziness and headache EXAM: CT HEAD WITHOUT CONTRAST TECHNIQUE: Contiguous axial images were obtained from the base of the skull through the vertex without intravenous contrast. RADIATION DOSE REDUCTION: This exam was performed according to the departmental dose-optimization program which includes automated exposure control, adjustment of the mA and/or kV according to patient size and/or use of iterative reconstruction technique. COMPARISON:  05/31/2022 FINDINGS: Brain: There is no mass, hemorrhage or extra-axial collection. The size and configuration of the ventricles and extra-axial CSF spaces are normal. The brain parenchyma is normal, without acute or chronic infarction. Vascular: No abnormal hyperdensity of the major intracranial arteries or dural venous sinuses. No intracranial atherosclerosis. Skull: The visualized skull  base, calvarium and extracranial soft tissues are normal. Sinuses/Orbits: No fluid levels or advanced mucosal thickening of the visualized paranasal sinuses. No mastoid or middle ear effusion. The orbits are normal. IMPRESSION: Normal head CT. Electronically Signed   By: KeUlyses Jarred.D.   On: 06/14/2022 22:09    Procedures Procedures    Medications Ordered in  ED Medications  LORazepam (ATIVAN) tablet 0.5 mg (has no administration in time range)  LORazepam (ATIVAN) injection 0.5 mg (has no administration in time range)  LORazepam (ATIVAN) injection 1 mg (1 mg Intravenous Given 06/15/22 0632)  sodium chloride (PF) 0.9 % injection (has no administration in time range)  metoCLOPramide (REGLAN) injection 10 mg (10 mg Intravenous Given 06/14/22 2214)  diphenhydrAMINE (BENADRYL) injection 12.5 mg (12.5 mg Intravenous Given 06/14/22 2215)  meclizine (ANTIVERT) tablet 25 mg (25 mg Oral Given 06/14/22 2133)  acetaminophen (TYLENOL) tablet 1,000 mg (1,000 mg Oral Given 06/14/22 2346)  iohexol (OMNIPAQUE) 350 MG/ML injection 75 mL (75 mLs Intravenous Contrast Given 06/15/22 0341)  iohexol (OMNIPAQUE) 350 MG/ML injection 60 mL (60 mLs Intravenous Contrast Given 06/15/22 0341)  cloNIDine (CATAPRES) tablet 0.2 mg (0.2 mg Oral Given 06/15/22 0513)  predniSONE (DELTASONE) tablet 60 mg (60 mg Oral Given 06/15/22 1287)    ED Course/ Medical Decision Making/ A&P                           Medical Decision Making Amount and/or Complexity of Data Reviewed Labs: ordered. Radiology: ordered.  Risk OTC drugs. Prescription drug management.   56 year old female with medical history significant for DM 2, HTN, GERD, previous right-sided pontine stroke on Plavix, recent diagnosis of left eye vision loss due to suspected giant cell arteritis who presents emergency department with roughly 2 to 3 days of vertiginous symptoms with room spinning dizziness, worsening left hemibody weakness.  The patient has residual left  hemibody weakness since her prior CVA.  She states that over the last 2 to 3 days she has noticed an increase in weakness in the left hemibody.  She has had persistent room spinning dizziness during that time as well. She endorses a generalized headache.   Additionally, the patient states that over the past day she has had bad reflux/heartburn symptoms with burning in her chest with no radiation.  She endorses mild shortness of breath.  She denies any cough, fever or chills.  On exam, the patient was tachycardic, sinus tachycardia noted on cardiac telemetry, mildly hypertensive, with left hemibody weakness.  Unclear if this is worse from her baseline.  The patient also has blindness in the left eye from a diagnosis of temporal arteritis.  She endorses a headache with vertiginous symptoms that have been ongoing for the past 2 to 3 days.  Concern for complex migraine, temporal arteritis, worsening and new CVA, intracranial hemorrhage.  Broad-based work-up initiated given the patient's tachycardia and multiple symptoms.  A CT head was performed which revealed no acute intracranial abnormality.  Chest x-ray revealed no active disease.  Given the patient's tachycardia and mild shortness of breath, considered CHF exacerbation, PE, pneumonia.  CTA PE study was ordered and pending at time of signout.  CT angio head and neck was ordered at the recommendation of on-call neurology.  MRI brain was also ordered and pending.  Laboratory evaluation significant for cardiac troponins negative, COVID-19 and influenza PCR testing negative, BMP generally unremarkable, UDS positive for opiates and benzodiazepines, CRP elevated to 9.8, ESR elevated to 85, ethanol level normal, CBC with a mild nonspecific leukocytosis to 11.2, anemia to 10.4, urinalysis without evidence of UTI.  Patient was administered meclizine and a migraine cocktail for her symptoms.  Plan at time of signout to follow-up with the patient's imaging of her chest,  neck and head and follow-up MRI imaging.  Disposition pending reassessment following imaging.  Signout given to Dr. Stark Jock at 2330.   Final Clinical Impression(s) / ED Diagnoses Final diagnoses:  Vertigo    Rx / DC Orders ED Discharge Orders          Ordered    predniSONE (DELTASONE) 10 MG tablet  Daily        06/15/22 0827    meclizine (ANTIVERT) 25 MG tablet  3 times daily PRN        06/15/22 0827    ondansetron (ZOFRAN) 4 MG tablet  Every 6 hours        06/15/22 0827              Regan Lemming, MD 06/15/22 1002

## 2022-06-14 NOTE — ED Triage Notes (Signed)
BIB [facility] from rehab with c/o dizziness, headache, n/v x 3 days. States she feels L side is weaker than R, negative stroke screen in the field. Hx of TIA and HTN.

## 2022-06-15 ENCOUNTER — Emergency Department (HOSPITAL_COMMUNITY): Payer: Medicare Other

## 2022-06-15 ENCOUNTER — Encounter (HOSPITAL_COMMUNITY): Payer: Self-pay

## 2022-06-15 DIAGNOSIS — R42 Dizziness and giddiness: Secondary | ICD-10-CM | POA: Diagnosis not present

## 2022-06-15 DIAGNOSIS — E119 Type 2 diabetes mellitus without complications: Secondary | ICD-10-CM | POA: Diagnosis not present

## 2022-06-15 DIAGNOSIS — H547 Unspecified visual loss: Secondary | ICD-10-CM | POA: Diagnosis not present

## 2022-06-15 DIAGNOSIS — M6281 Muscle weakness (generalized): Secondary | ICD-10-CM | POA: Diagnosis not present

## 2022-06-15 DIAGNOSIS — I119 Hypertensive heart disease without heart failure: Secondary | ICD-10-CM | POA: Diagnosis not present

## 2022-06-15 DIAGNOSIS — M316 Other giant cell arteritis: Secondary | ICD-10-CM | POA: Diagnosis not present

## 2022-06-15 DIAGNOSIS — R5381 Other malaise: Secondary | ICD-10-CM | POA: Diagnosis not present

## 2022-06-15 LAB — BRAIN NATRIURETIC PEPTIDE: B Natriuretic Peptide: 63.1 pg/mL (ref 0.0–100.0)

## 2022-06-15 LAB — TROPONIN I (HIGH SENSITIVITY)
Troponin I (High Sensitivity): 8 ng/L (ref <18)
Troponin I (High Sensitivity): 11 ng/L (ref <18)

## 2022-06-15 LAB — SEDIMENTATION RATE: Sed Rate: 85 mm/hr — ABNORMAL HIGH (ref 0–22)

## 2022-06-15 LAB — C-REACTIVE PROTEIN: CRP: 9.8 mg/dL — ABNORMAL HIGH (ref <1.0)

## 2022-06-15 MED ORDER — PREDNISONE 20 MG PO TABS
60.0000 mg | ORAL_TABLET | Freq: Once | ORAL | Status: AC
  Administered 2022-06-15: 60 mg via ORAL
  Filled 2022-06-15: qty 3

## 2022-06-15 MED ORDER — CLONIDINE HCL 0.1 MG PO TABS
0.2000 mg | ORAL_TABLET | Freq: Once | ORAL | Status: AC
  Administered 2022-06-15: 0.2 mg via ORAL
  Filled 2022-06-15: qty 2

## 2022-06-15 MED ORDER — IOHEXOL 350 MG/ML SOLN
60.0000 mL | Freq: Once | INTRAVENOUS | Status: AC | PRN
  Administered 2022-06-15: 60 mL via INTRAVENOUS

## 2022-06-15 MED ORDER — MECLIZINE HCL 25 MG PO TABS: 25.0000 mg | ORAL_TABLET | Freq: Three times a day (TID) | ORAL | 0 refills | Status: DC | PRN

## 2022-06-15 MED ORDER — ONDANSETRON HCL 4 MG PO TABS: 4.0000 mg | ORAL_TABLET | Freq: Four times a day (QID) | ORAL | 0 refills | Status: DC

## 2022-06-15 MED ORDER — IOHEXOL 350 MG/ML SOLN
75.0000 mL | Freq: Once | INTRAVENOUS | Status: AC | PRN
  Administered 2022-06-15: 75 mL via INTRAVENOUS

## 2022-06-15 MED ORDER — SODIUM CHLORIDE (PF) 0.9 % IJ SOLN
INTRAMUSCULAR | Status: AC
  Filled 2022-06-15: qty 100

## 2022-06-15 MED ORDER — PREDNISONE 10 MG PO TABS: 60.0000 mg | ORAL_TABLET | Freq: Every day | ORAL | 0 refills | Status: AC

## 2022-06-15 NOTE — Discharge Instructions (Addendum)
Return for any problem.   As previously instructed, it is important that you take prednisone daily to treat a possible vasculitis.  Your PCP and/or rheumatologist will instruct you as to when to stop taking prednisone.

## 2022-06-15 NOTE — ED Notes (Signed)
Gave report to [employer] at [facility]. Called [facility] for transport

## 2022-06-15 NOTE — ED Notes (Signed)
MD notified of pt BP, now new orders received at this time.

## 2022-06-15 NOTE — ED Notes (Signed)
Patient transported to MRI 

## 2022-06-15 NOTE — ED Notes (Signed)
Save red and dark green tube in main lab

## 2022-06-15 NOTE — ED Provider Notes (Signed)
Patient seen after prior EDP.  She feels improved.  MRI brain is without significant acute abnormality.  After discussing patient's symptoms and recent admission it becomes clear that the patient is not taking prednisone as previously prescribed.  She reports that she felt like it was causing swelling in her lower extremities.  She told her [facility] staff that she did not want to take the prednisone.  Patient's recent admission with discharge on June 19 clearly demonstrates concern for possible vasculitis.  Temporal artery biopsy was negative at that time.  Patient however still met clinical criteria for temporal arteritis.  Patient was advised to take 60 mg of prednisone daily for 3 to 5 months.  Patient admits that its been "several days" since she took prednisone.  Patient will be given a dose of prednisone now.  Patient is advised that she should continue to take prednisone daily as previously instructed.  Close outpatient follow-up with both her PCP and also with rheumatology is again reiterated.  Patient is appropriate for discharge.  Importance of close follow-up is repeatedly stressed.    Valarie Merino, MD 06/15/22 [phone]

## 2022-06-16 DIAGNOSIS — I152 Hypertension secondary to endocrine disorders: Secondary | ICD-10-CM | POA: Diagnosis not present

## 2022-06-16 DIAGNOSIS — E1159 Type 2 diabetes mellitus with other circulatory complications: Secondary | ICD-10-CM | POA: Diagnosis not present

## 2022-06-16 DIAGNOSIS — M316 Other giant cell arteritis: Secondary | ICD-10-CM | POA: Diagnosis not present

## 2022-06-16 DIAGNOSIS — R42 Dizziness and giddiness: Secondary | ICD-10-CM | POA: Diagnosis not present

## 2022-06-16 NOTE — Progress Notes (Deleted)
[facility] [address] [place]. [place] [address] [phone]       HOSPITAL FOLLOW UP NOTE  [name] Date of Birth:  27-Jan-1966 Medical Record Number:  [id number]   Reason for Referral:  hospital stroke follow up    SUBJECTIVE:   CHIEF COMPLAINT:  No chief complaint on file.   HPI:   Erin Jackson is a 56 y.o. female with history of hypertension, diabetes, chest pain status post cardiac cath, mild cardiomyopathy, smoker, LBP who presented on 04/20/2022 with nausea vomiting, chest pain, shortness of breath, headache, left facial numbness, dizziness for several days and hypertensive urgency. No tPA given due to outside window.  Personally reviewed hospitalization pertinent progress notes, lab work and imaging.  Evaluated by Dr. Erlinda Hong for stroke work-up revealing right pontine infarct and punctate left hippocampus and frontal white matter infarcts secondary to small vessel disease vs cardioembolic source.  MRA moderate left M2 stenosis.  Carotid Doppler unremarkable.  EF 40 to 45%, no evidence of PFO.  EEG negative for seizure.  Recommended 30-day cardiac event monitor to rule out A-fib.  LDL 102.  A1c 6.8.  UDS positive for THC.  Resume Plavix 75 mg daily (hx of aspirin allergy) and resumed home dose atorvastatin 80 mg daily in addition to Zetia 10 mg daily.  BP stabilized with long-term BP goal normotensive range.  Current tobacco use with smoking cessation counseling provided.  Per therapy recommendations, discharged to CIR on 5/10.  During CIR admission, noted improvement of function and awareness and discharged home on 5/19.  Presented to ED 6/9 with dizziness and lightheadedness since 6/5 with CTH negative and left AMA.  Returned on 6/11 for left temporal headache and left eye visual loss. MR brain and orbital no acute finding.  CTA head/neck negative LVO, soft plaque narrowing distal left ICA with less than 50% stenosis.  Evaluated by Dr. Erlinda Hong, on exam,  total left eye vision loss, tenderness to touch on left temporal, parietal and orbital areas and etiology concerning for temporal arteritis with elevation in inflammatory markers.  Temporal artery biopsy negative for temporal arteritis but treated for such as she met clinical criteria.  Placed on prednisone for 3 to 37-monthduration and advised outpatient follow-up with ophthalmology and rheumatology.  [facility]  Return to ED 6/25 with 2 to 3-day of vertigo symptoms and worsening left sided weakness and generalized headache. MR brain negative for acute findings.  Inflammatory markers remain elevated.  She was provided meclizine and migraine cocktail with improvement of headache. Per ED note, had been noncompliant with prednisone as she felt it was causing swelling in her lower extremities.  Advised to restart and follow-up with PCP and rheumatology.         PERTINENT IMAGING/LABS  Per hospitalization 04/20/2022 CT no acute abnormality MRI  right pontine infarct and punctate left hippocampus and frontal white matter infarcts MRA intracranial stenosis moderate left M2 stenosis Carotid Doppler unremarkable 2D Echo EF 40 to 45%, no PFO EEG normal, no seizure  LDL 102 HgbA1c 6.8 UDS positive for THC    ROS:   14 system review of systems performed and negative with exception of ***  PMH:  Past Medical History:  Diagnosis Date   Anxiety    Chronic back pain    Diabetes mellitus without complication (HCC)    GERD (gastroesophageal reflux disease)    Hypertension     PSH:  Past Surgical History:  Procedure Laterality Date   ABDOMINAL HYSTERECTOMY     ARTERY BIOPSY Left 06/02/2022   Procedure: BIOPSY TEMPORAL ARTERY;  Surgeon: Waynetta Sandy, MD;  Location: [facility];  Service: Vascular;  Laterality: Left;   BREAST LUMPECTOMY Left 03/25/2022   Procedure: LEFT BREAST LUMPECTOMY;  Surgeon: Jovita Kussmaul, MD;  Location: [facility];  Service: General;  Laterality: Left;   BREAST REDUCTION SURGERY Bilateral 08/15/2018   Procedure: BILATERAL MAMMARY REDUCTION  (BREAST);  Surgeon: Cristine Polio, MD;  Location: [facility];  Service: Plastics;  Laterality: Bilateral;   BREAST REDUCTION SURGERY Left 09/25/2019   Procedure: EXCISION OF FAT NECROSIS OF LEFT BREAST;  Surgeon: Cristine Polio, MD;  Location: [facility];  Service: Plastics;  Laterality: Left;   CARPAL TUNNEL RELEASE Right 06/17/2020   right thumb CMC arthroplasty with tendon transfer and right carpal tunnel release.    CERVICAL SPINE SURGERY  2015   has a metal plate   FOOT SURGERY Left 2014   HAND SURGERY     LEFT HEART CATH AND CORONARY ANGIOGRAPHY N/A 04/09/2022   Procedure: LEFT HEART CATH AND CORONARY ANGIOGRAPHY;  Surgeon: Dixie Dials, MD;  Location: [facility];  Service: Cardiovascular;  Laterality: N/A;   REDUCTION MAMMAPLASTY Bilateral 07/2018    Social History:  Social History   Socioeconomic History   Marital status: Divorced    Spouse name: Not on file   Number of children: Not on file   Years of education: Not on file   Highest education level: Not on file  Occupational History   Occupation: disabled  Tobacco Use   Smoking status: Former    Packs/day: 0.50    Types: Cigarettes    Quit date: 04/21/2022    Years since quitting: 0.1   Smokeless tobacco: Never  Vaping Use   Vaping Use: Never used  Substance and Sexual Activity   Alcohol use: Yes    Comment: socially   Drug use: Not Currently    Types: Marijuana    Comment: occasional   Sexual activity: Never  Other Topics Concern   Not on file  Social History Narrative   Not on file   Social Determinants of Health   Financial Resource Strain: Not on file  Food Insecurity: Not on file  Transportation Needs: Not on file  Physical Activity: Not on file  Stress: Not on file  Social Connections: Not on file  Intimate Partner  Violence: Not on file    Family History:  Family History  Problem Relation Age of Onset   Breast cancer Maternal Grandmother    Breast cancer Maternal Aunt     Medications:   Current Outpatient Medications on File Prior to Visit  Medication Sig Dispense Refill   acetaminophen (TYLENOL) 325 MG tablet Take 2 tablets (650 mg total) by mouth every 4 (four) hours as needed for fever or mild pain.     allopurinol (ZYLOPRIM) 100 MG tablet Take 100 mg by mouth daily. (Patient not taking: Reported on 06/15/2022)     alprazolam (XANAX) 2 MG tablet Take 1 tablet (2 mg total) by mouth 3 (three) times daily as needed for anxiety. (Patient taking differently: Take 1 mg by mouth 2 (two) times daily.) 90 tablet 3   atorvastatin (LIPITOR) 80 MG tablet Take 1 tablet (80 mg total) by mouth daily. (Patient taking differently: Take 80 mg by mouth at bedtime.) 30 tablet 0   cetirizine (ZYRTEC ALLERGY) 10 MG tablet  Take 1 tablet (10 mg total) by mouth daily. (Patient taking differently: Take 10 mg by mouth daily as needed for allergies.) 30 tablet 1   cloNIDine (CATAPRES) 0.1 MG tablet Take 1 tablet (0.1 mg total) by mouth 2 (two) times daily. (Patient taking differently: Take 0.2 mg by mouth 2 (two) times daily.) 60 tablet 0   clopidogrel (PLAVIX) 75 MG tablet Take 1 tablet (75 mg total) by mouth daily. 30 tablet 0   EPINEPHRINE 0.3 mg/0.3 mL IJ SOAJ injection Inject 0.3 mLs (0.3 mg total) into the muscle once as needed (allergic reaction). (Patient taking differently: Inject 0.3 mg into the muscle as needed for anaphylaxis (allergic reaction).) 1 Device 1   ferrous gluconate (FERGON) 324 MG tablet Take 324 mg by mouth every other day.     HYDROcodone-acetaminophen (NORCO) 10-325 MG tablet Take 1 tablet by mouth 3 (three) times daily as needed for moderate pain. 30 tablet 0   hydrOXYzine (ATARAX) 25 MG tablet Take 25 mg by mouth 3 (three) times daily. (Patient not taking: Reported on 06/15/2022)     insulin glargine  (LANTUS) 100 UNIT/ML injection Inject 0.25 mLs (25 Units total) into the skin at bedtime. (Patient not taking: Reported on 06/15/2022) 10 mL 11   insulin lispro (HUMALOG) 100 UNIT/ML injection Inject 2-12 Units into the skin 2 (two) times daily. Per sliding scale, if 70 to 200= 0 units, 201 to 250= 2 units, 251 to 300= 4 units 301 to 350= 6 units, 351 to 400= 8 units, 401 to 450= 10 units, 451 to 600= 12 units     Lancets (ONETOUCH DELICA PLUS [id number]) MISC Apply topically daily.     losartan (COZAAR) 100 MG tablet Take 1 tablet (100 mg total) by mouth daily.     meclizine (ANTIVERT) 12.5 MG tablet Take 1 tablet (12.5 mg total) by mouth 2 (two) times daily as needed for dizziness. 30 tablet 0   meclizine (ANTIVERT) 25 MG tablet Take 1 tablet (25 mg total) by mouth 3 (three) times daily as needed for dizziness. 30 tablet 0   metFORMIN (GLUCOPHAGE) 500 MG tablet Take 1 tablet (500 mg total) by mouth 2 (two) times daily with a meal. 60 tablet 0   methocarbamol (ROBAXIN) 500 MG tablet Take 1 tablet (500 mg total) by mouth every 6 (six) hours as needed for muscle spasms. 30 tablet 0   NARCAN 4 MG/0.1ML LIQD nasal spray kit Place 0.4 mg into the nose as needed (opoid overdose).     nitroGLYCERIN (NITROSTAT) 0.4 MG SL tablet Place 1 tablet (0.4 mg total) under the tongue every 5 (five) minutes as needed for chest pain. 25 tablet 0   omeprazole (PRILOSEC) 20 MG capsule Take 20 mg by mouth 2 (two) times daily.     ondansetron (ZOFRAN) 4 MG tablet Take 1 tablet (4 mg total) by mouth every 6 (six) hours. 12 tablet 0   ONETOUCH VERIO test strip daily.     predniSONE (DELTASONE) 10 MG tablet Take 6 tablets (60 mg total) by mouth daily. Patient instructed to take Prednisone 72m by mouth Daily until stopped by PCP/Rheumatology (patient will likely need prolonged course for 3-5 months) 180 tablet 0   prochlorperazine (COMPAZINE) 5 MG tablet Take 1-2 tablets (5-10 mg total) by mouth every 6 (six) hours as needed for  nausea. (Patient taking differently: Take 5 mg by mouth every 6 (six) hours as needed for nausea.) 30 tablet 0   traZODone (DESYREL) 150 MG tablet Take 1  tablet (150 mg total) by mouth at bedtime. 30 tablet 0   Vitamin D, Ergocalciferol, (DRISDOL) 1.25 MG (50000 UNIT) CAPS capsule Take 1 capsule (50,000 Units total) by mouth every 7 (seven) days. (Patient taking differently: Take 50,000 Units by mouth every 7 (seven) days. Saturday) 5 capsule    Current Facility-Administered Medications on File Prior to Visit  Medication Dose Route Frequency Provider Last Rate Last Admin   lidocaine (LIDODERM) 5 % 1 patch  1 patch Transdermal Q24H Jennye Boroughs, MD        Allergies:   Allergies  Allergen Reactions   Cucumber Extract Anaphylaxis   Molds & Smuts Shortness Of Breath   Norvasc [Amlodipine] Other (See Comments)    Muscle became very tight   Other Anaphylaxis and Other (See Comments)    Tree nuts, walnuts, peanuts, pickles   Apple Juice Hives   Aspirin Rash   Ibuprofen Rash   Niacin And Related Rash      OBJECTIVE:  Physical Exam  There were no vitals filed for this visit. There is no height or weight on file to calculate BMI. No results found.     05/19/2022   10:38 AM  Depression screen PHQ 2/9  Decreased Interest 1  Down, Depressed, Hopeless 2  PHQ - 2 Score 3  Altered sleeping 3  Tired, decreased energy 0  Change in appetite 0  Feeling bad or failure about yourself  1  Trouble concentrating 3  Moving slowly or fidgety/restless 3  Suicidal thoughts 0  PHQ-9 Score 13     General: well developed, well nourished, seated, in no evident distress Head: head normocephalic and atraumatic.   Neck: supple with no carotid or supraclavicular bruits Cardiovascular: regular rate and rhythm, no murmurs Musculoskeletal: no deformity Skin:  no rash/petichiae Vascular:  Normal pulses all extremities   Neurologic Exam Mental Status: Awake and fully alert. Oriented to place  and time. Recent and remote memory intact. Attention span, concentration and fund of knowledge appropriate. Mood and affect appropriate.  Cranial Nerves: Fundoscopic exam reveals sharp disc margins. Pupils equal, briskly reactive to light. Extraocular movements full without nystagmus. Visual fields full to confrontation. Hearing intact. Facial sensation intact. Face, tongue, palate moves normally and symmetrically.  Motor: Normal bulk and tone. Normal strength in all tested extremity muscles Sensory.: intact to touch , pinprick , position and vibratory sensation.  Coordination: Rapid alternating movements normal in all extremities. Finger-to-nose and heel-to-shin performed accurately bilaterally. Gait and Station: Arises from chair without difficulty. Stance is normal. Gait demonstrates normal stride length and balance with ***. Tandem walk and heel toe ***.  Reflexes: 1+ and symmetric. Toes downgoing.     NIHSS  *** Modified Rankin  ***      ASSESSMENT: Erin Jackson is a 56 y.o. year old female with right pontine, punctate left hippocampus and frontal white matter infarcts on 04/20/2022 secondary to small vessel disease vs cardioembolic source. Vascular risk factors include HTN, HLD, DM, CAD, PVD, mild cardiomyopathy and tobacco use.  Diagnosed with left eye vision loss 05/31/2022 likely due to temporal arteritis with elevated inflammatory markers and despite negative biopsy.     PLAN:  Ischemic strokes:  Residual deficit: ***.  Continue clopidogrel 75 mg daily (aspirin allergy) and ***  for secondary stroke prevention.   Cardiac monitor *** Discussed secondary stroke prevention measures and importance of close PCP follow up for aggressive stroke risk factor management including BP goal<130/90, HLD with LDL goal<70 and DM with A1c.<7.  Stroke labs 04/2022: LDL 102, A1c 6.8.   I have gone over the pathophysiology of stroke, warning signs and symptoms, risk factors and their management in  some detail with instructions to go to the closest emergency room for symptoms of concern. Temporal arteritis:     Follow up in *** or call earlier if needed   CC:  [facility] provider: Dr. Leonie Man PCP: Pcp, No    I spent *** minutes of face-to-face and non-face-to-face time with patient.  This included previsit chart review including review of recent hospitalization, lab review, study review, order entry, electronic health record documentation, patient education regarding recent stroke including etiology, secondary stroke prevention measures and importance of managing stroke risk factors, residual deficits and typical recovery time and answered all other questions to patient satisfaction   Frann Rider, AGNP-BC  [facility] [address] [place], [place] [address]  Phone [phone] Fax [phone] Note: This document was prepared with digital dictation and possible smart phrase technology. Any transcriptional errors that result from this process are unintentional.

## 2022-06-17 ENCOUNTER — Encounter: Payer: Self-pay | Admitting: Adult Health

## 2022-06-17 ENCOUNTER — Inpatient Hospital Stay: Payer: Medicare Other | Admitting: Adult Health

## 2022-06-17 DIAGNOSIS — M316 Other giant cell arteritis: Secondary | ICD-10-CM | POA: Diagnosis not present

## 2022-06-17 DIAGNOSIS — E876 Hypokalemia: Secondary | ICD-10-CM | POA: Diagnosis not present

## 2022-06-17 DIAGNOSIS — I69352 Hemiplegia and hemiparesis following cerebral infarction affecting left dominant side: Secondary | ICD-10-CM | POA: Diagnosis not present

## 2022-06-17 DIAGNOSIS — E1122 Type 2 diabetes mellitus with diabetic chronic kidney disease: Secondary | ICD-10-CM | POA: Diagnosis not present

## 2022-06-17 DIAGNOSIS — K219 Gastro-esophageal reflux disease without esophagitis: Secondary | ICD-10-CM | POA: Diagnosis not present

## 2022-06-17 DIAGNOSIS — D72829 Elevated white blood cell count, unspecified: Secondary | ICD-10-CM | POA: Diagnosis not present

## 2022-06-17 DIAGNOSIS — Z8673 Personal history of transient ischemic attack (TIA), and cerebral infarction without residual deficits: Secondary | ICD-10-CM | POA: Diagnosis not present

## 2022-06-17 DIAGNOSIS — Z7984 Long term (current) use of oral hypoglycemic drugs: Secondary | ICD-10-CM | POA: Diagnosis not present

## 2022-06-17 DIAGNOSIS — R5381 Other malaise: Secondary | ICD-10-CM | POA: Diagnosis not present

## 2022-06-17 DIAGNOSIS — R509 Fever, unspecified: Secondary | ICD-10-CM | POA: Diagnosis not present

## 2022-06-17 DIAGNOSIS — R569 Unspecified convulsions: Secondary | ICD-10-CM | POA: Diagnosis not present

## 2022-06-17 DIAGNOSIS — E860 Dehydration: Secondary | ICD-10-CM | POA: Diagnosis not present

## 2022-06-17 DIAGNOSIS — R2689 Other abnormalities of gait and mobility: Secondary | ICD-10-CM | POA: Diagnosis not present

## 2022-06-17 DIAGNOSIS — J9811 Atelectasis: Secondary | ICD-10-CM | POA: Diagnosis not present

## 2022-06-17 DIAGNOSIS — N183 Chronic kidney disease, stage 3 unspecified: Secondary | ICD-10-CM | POA: Diagnosis not present

## 2022-06-17 DIAGNOSIS — N1832 Chronic kidney disease, stage 3b: Secondary | ICD-10-CM | POA: Diagnosis not present

## 2022-06-17 DIAGNOSIS — D649 Anemia, unspecified: Secondary | ICD-10-CM | POA: Diagnosis not present

## 2022-06-17 DIAGNOSIS — G4489 Other headache syndrome: Secondary | ICD-10-CM | POA: Diagnosis not present

## 2022-06-17 DIAGNOSIS — H5462 Unqualified visual loss, left eye, normal vision right eye: Secondary | ICD-10-CM | POA: Diagnosis not present

## 2022-06-17 DIAGNOSIS — R0902 Hypoxemia: Secondary | ICD-10-CM | POA: Diagnosis not present

## 2022-06-17 DIAGNOSIS — Z743 Need for continuous supervision: Secondary | ICD-10-CM | POA: Diagnosis not present

## 2022-06-17 DIAGNOSIS — E119 Type 2 diabetes mellitus without complications: Secondary | ICD-10-CM | POA: Diagnosis not present

## 2022-06-17 DIAGNOSIS — N179 Acute kidney failure, unspecified: Secondary | ICD-10-CM | POA: Diagnosis not present

## 2022-06-17 DIAGNOSIS — I152 Hypertension secondary to endocrine disorders: Secondary | ICD-10-CM | POA: Diagnosis not present

## 2022-06-17 DIAGNOSIS — H547 Unspecified visual loss: Secondary | ICD-10-CM | POA: Diagnosis not present

## 2022-06-17 DIAGNOSIS — R2681 Unsteadiness on feet: Secondary | ICD-10-CM | POA: Diagnosis not present

## 2022-06-17 DIAGNOSIS — I5022 Chronic systolic (congestive) heart failure: Secondary | ICD-10-CM | POA: Diagnosis not present

## 2022-06-17 DIAGNOSIS — I130 Hypertensive heart and chronic kidney disease with heart failure and stage 1 through stage 4 chronic kidney disease, or unspecified chronic kidney disease: Secondary | ICD-10-CM | POA: Diagnosis not present

## 2022-06-17 DIAGNOSIS — F17210 Nicotine dependence, cigarettes, uncomplicated: Secondary | ICD-10-CM | POA: Diagnosis not present

## 2022-06-17 DIAGNOSIS — I10 Essential (primary) hypertension: Secondary | ICD-10-CM | POA: Diagnosis not present

## 2022-06-17 DIAGNOSIS — R262 Difficulty in walking, not elsewhere classified: Secondary | ICD-10-CM | POA: Diagnosis not present

## 2022-06-17 DIAGNOSIS — Z91199 Patient's noncompliance with other medical treatment and regimen due to unspecified reason: Secondary | ICD-10-CM | POA: Diagnosis not present

## 2022-06-17 DIAGNOSIS — R112 Nausea with vomiting, unspecified: Secondary | ICD-10-CM | POA: Diagnosis not present

## 2022-06-17 DIAGNOSIS — Z9181 History of falling: Secondary | ICD-10-CM | POA: Diagnosis not present

## 2022-06-17 DIAGNOSIS — M180 Bilateral primary osteoarthritis of first carpometacarpal joints: Secondary | ICD-10-CM | POA: Diagnosis not present

## 2022-06-17 DIAGNOSIS — G4733 Obstructive sleep apnea (adult) (pediatric): Secondary | ICD-10-CM | POA: Diagnosis not present

## 2022-06-17 DIAGNOSIS — I119 Hypertensive heart disease without heart failure: Secondary | ICD-10-CM | POA: Diagnosis not present

## 2022-06-17 DIAGNOSIS — I69398 Other sequelae of cerebral infarction: Secondary | ICD-10-CM | POA: Diagnosis not present

## 2022-06-17 DIAGNOSIS — I471 Supraventricular tachycardia: Secondary | ICD-10-CM | POA: Diagnosis not present

## 2022-06-17 DIAGNOSIS — F17200 Nicotine dependence, unspecified, uncomplicated: Secondary | ICD-10-CM | POA: Diagnosis not present

## 2022-06-17 DIAGNOSIS — R000 Tachycardia, unspecified: Secondary | ICD-10-CM | POA: Diagnosis not present

## 2022-06-17 DIAGNOSIS — M6281 Muscle weakness (generalized): Secondary | ICD-10-CM | POA: Diagnosis not present

## 2022-06-17 DIAGNOSIS — E1169 Type 2 diabetes mellitus with other specified complication: Secondary | ICD-10-CM | POA: Diagnosis not present

## 2022-06-17 DIAGNOSIS — R42 Dizziness and giddiness: Secondary | ICD-10-CM | POA: Diagnosis not present

## 2022-06-17 DIAGNOSIS — R059 Cough, unspecified: Secondary | ICD-10-CM | POA: Diagnosis not present

## 2022-06-17 DIAGNOSIS — I2510 Atherosclerotic heart disease of native coronary artery without angina pectoris: Secondary | ICD-10-CM | POA: Diagnosis not present

## 2022-06-17 DIAGNOSIS — E1159 Type 2 diabetes mellitus with other circulatory complications: Secondary | ICD-10-CM | POA: Diagnosis not present

## 2022-06-17 DIAGNOSIS — Z20822 Contact with and (suspected) exposure to covid-19: Secondary | ICD-10-CM | POA: Diagnosis not present

## 2022-06-18 DIAGNOSIS — M316 Other giant cell arteritis: Secondary | ICD-10-CM | POA: Diagnosis not present

## 2022-06-18 DIAGNOSIS — I10 Essential (primary) hypertension: Secondary | ICD-10-CM | POA: Diagnosis not present

## 2022-06-18 DIAGNOSIS — E876 Hypokalemia: Secondary | ICD-10-CM | POA: Diagnosis not present

## 2022-06-18 DIAGNOSIS — Z8673 Personal history of transient ischemic attack (TIA), and cerebral infarction without residual deficits: Secondary | ICD-10-CM | POA: Diagnosis not present

## 2022-06-18 DIAGNOSIS — N179 Acute kidney failure, unspecified: Secondary | ICD-10-CM | POA: Diagnosis not present

## 2022-06-18 DIAGNOSIS — E119 Type 2 diabetes mellitus without complications: Secondary | ICD-10-CM | POA: Diagnosis not present

## 2022-06-18 DIAGNOSIS — I471 Supraventricular tachycardia: Secondary | ICD-10-CM | POA: Diagnosis not present

## 2022-06-18 DIAGNOSIS — R569 Unspecified convulsions: Secondary | ICD-10-CM | POA: Diagnosis not present

## 2022-06-18 DIAGNOSIS — R000 Tachycardia, unspecified: Secondary | ICD-10-CM | POA: Diagnosis not present

## 2022-06-19 DIAGNOSIS — J9811 Atelectasis: Secondary | ICD-10-CM | POA: Diagnosis not present

## 2022-06-19 DIAGNOSIS — R059 Cough, unspecified: Secondary | ICD-10-CM | POA: Diagnosis not present

## 2022-06-24 DIAGNOSIS — D649 Anemia, unspecified: Secondary | ICD-10-CM | POA: Diagnosis not present

## 2022-06-24 DIAGNOSIS — R42 Dizziness and giddiness: Secondary | ICD-10-CM | POA: Diagnosis not present

## 2022-06-24 DIAGNOSIS — N1832 Chronic kidney disease, stage 3b: Secondary | ICD-10-CM | POA: Diagnosis not present

## 2022-06-24 DIAGNOSIS — D72829 Elevated white blood cell count, unspecified: Secondary | ICD-10-CM | POA: Diagnosis not present

## 2022-06-24 DIAGNOSIS — R262 Difficulty in walking, not elsewhere classified: Secondary | ICD-10-CM | POA: Diagnosis not present

## 2022-06-24 DIAGNOSIS — I129 Hypertensive chronic kidney disease with stage 1 through stage 4 chronic kidney disease, or unspecified chronic kidney disease: Secondary | ICD-10-CM | POA: Diagnosis not present

## 2022-06-24 DIAGNOSIS — E559 Vitamin D deficiency, unspecified: Secondary | ICD-10-CM | POA: Diagnosis not present

## 2022-06-24 DIAGNOSIS — H547 Unspecified visual loss: Secondary | ICD-10-CM | POA: Diagnosis not present

## 2022-06-24 DIAGNOSIS — M6281 Muscle weakness (generalized): Secondary | ICD-10-CM | POA: Diagnosis not present

## 2022-06-24 DIAGNOSIS — S0993XA Unspecified injury of face, initial encounter: Secondary | ICD-10-CM | POA: Diagnosis not present

## 2022-06-24 DIAGNOSIS — Z743 Need for continuous supervision: Secondary | ICD-10-CM | POA: Diagnosis not present

## 2022-06-24 DIAGNOSIS — I119 Hypertensive heart disease without heart failure: Secondary | ICD-10-CM | POA: Diagnosis not present

## 2022-06-24 DIAGNOSIS — I69398 Other sequelae of cerebral infarction: Secondary | ICD-10-CM | POA: Diagnosis not present

## 2022-06-24 DIAGNOSIS — I69354 Hemiplegia and hemiparesis following cerebral infarction affecting left non-dominant side: Secondary | ICD-10-CM | POA: Diagnosis not present

## 2022-06-24 DIAGNOSIS — E1159 Type 2 diabetes mellitus with other circulatory complications: Secondary | ICD-10-CM | POA: Diagnosis not present

## 2022-06-24 DIAGNOSIS — Z7952 Long term (current) use of systemic steroids: Secondary | ICD-10-CM | POA: Diagnosis not present

## 2022-06-24 DIAGNOSIS — R2689 Other abnormalities of gait and mobility: Secondary | ICD-10-CM | POA: Diagnosis not present

## 2022-06-24 DIAGNOSIS — I471 Supraventricular tachycardia: Secondary | ICD-10-CM | POA: Diagnosis not present

## 2022-06-24 DIAGNOSIS — I739 Peripheral vascular disease, unspecified: Secondary | ICD-10-CM | POA: Diagnosis not present

## 2022-06-24 DIAGNOSIS — R2681 Unsteadiness on feet: Secondary | ICD-10-CM | POA: Diagnosis not present

## 2022-06-24 DIAGNOSIS — E119 Type 2 diabetes mellitus without complications: Secondary | ICD-10-CM | POA: Diagnosis not present

## 2022-06-24 DIAGNOSIS — R5381 Other malaise: Secondary | ICD-10-CM | POA: Diagnosis not present

## 2022-06-24 DIAGNOSIS — Z8673 Personal history of transient ischemic attack (TIA), and cerebral infarction without residual deficits: Secondary | ICD-10-CM | POA: Diagnosis not present

## 2022-06-24 DIAGNOSIS — N183 Chronic kidney disease, stage 3 unspecified: Secondary | ICD-10-CM | POA: Diagnosis not present

## 2022-06-24 DIAGNOSIS — N179 Acute kidney failure, unspecified: Secondary | ICD-10-CM | POA: Diagnosis not present

## 2022-06-24 DIAGNOSIS — M316 Other giant cell arteritis: Secondary | ICD-10-CM | POA: Diagnosis not present

## 2022-06-24 DIAGNOSIS — H5440 Blindness, one eye, unspecified eye: Secondary | ICD-10-CM | POA: Diagnosis not present

## 2022-06-24 DIAGNOSIS — I679 Cerebrovascular disease, unspecified: Secondary | ICD-10-CM | POA: Diagnosis not present

## 2022-06-24 DIAGNOSIS — R569 Unspecified convulsions: Secondary | ICD-10-CM | POA: Diagnosis not present

## 2022-06-24 DIAGNOSIS — E1121 Type 2 diabetes mellitus with diabetic nephropathy: Secondary | ICD-10-CM | POA: Diagnosis not present

## 2022-06-24 DIAGNOSIS — I152 Hypertension secondary to endocrine disorders: Secondary | ICD-10-CM | POA: Diagnosis not present

## 2022-06-24 DIAGNOSIS — I10 Essential (primary) hypertension: Secondary | ICD-10-CM | POA: Diagnosis not present

## 2022-06-25 DIAGNOSIS — I129 Hypertensive chronic kidney disease with stage 1 through stage 4 chronic kidney disease, or unspecified chronic kidney disease: Secondary | ICD-10-CM | POA: Diagnosis not present

## 2022-06-25 DIAGNOSIS — N179 Acute kidney failure, unspecified: Secondary | ICD-10-CM | POA: Diagnosis not present

## 2022-06-25 DIAGNOSIS — M316 Other giant cell arteritis: Secondary | ICD-10-CM | POA: Diagnosis not present

## 2022-06-25 DIAGNOSIS — I471 Supraventricular tachycardia: Secondary | ICD-10-CM | POA: Diagnosis not present

## 2022-06-25 DIAGNOSIS — R569 Unspecified convulsions: Secondary | ICD-10-CM | POA: Diagnosis not present

## 2022-06-25 DIAGNOSIS — I152 Hypertension secondary to endocrine disorders: Secondary | ICD-10-CM | POA: Diagnosis not present

## 2022-06-25 DIAGNOSIS — E1159 Type 2 diabetes mellitus with other circulatory complications: Secondary | ICD-10-CM | POA: Diagnosis not present

## 2022-06-25 DIAGNOSIS — N1832 Chronic kidney disease, stage 3b: Secondary | ICD-10-CM | POA: Diagnosis not present

## 2022-06-25 DIAGNOSIS — E1121 Type 2 diabetes mellitus with diabetic nephropathy: Secondary | ICD-10-CM | POA: Diagnosis not present

## 2022-06-25 DIAGNOSIS — S0993XA Unspecified injury of face, initial encounter: Secondary | ICD-10-CM | POA: Diagnosis not present

## 2022-06-25 DIAGNOSIS — Z7952 Long term (current) use of systemic steroids: Secondary | ICD-10-CM | POA: Diagnosis not present

## 2022-06-25 DIAGNOSIS — I679 Cerebrovascular disease, unspecified: Secondary | ICD-10-CM | POA: Diagnosis not present

## 2022-06-25 DIAGNOSIS — D649 Anemia, unspecified: Secondary | ICD-10-CM | POA: Diagnosis not present

## 2022-06-25 DIAGNOSIS — I69354 Hemiplegia and hemiparesis following cerebral infarction affecting left non-dominant side: Secondary | ICD-10-CM | POA: Diagnosis not present

## 2022-06-25 DIAGNOSIS — I739 Peripheral vascular disease, unspecified: Secondary | ICD-10-CM | POA: Diagnosis not present

## 2022-06-25 DIAGNOSIS — H5440 Blindness, one eye, unspecified eye: Secondary | ICD-10-CM | POA: Diagnosis not present

## 2022-06-29 DIAGNOSIS — M6281 Muscle weakness (generalized): Secondary | ICD-10-CM | POA: Diagnosis not present

## 2022-06-29 DIAGNOSIS — E119 Type 2 diabetes mellitus without complications: Secondary | ICD-10-CM | POA: Diagnosis not present

## 2022-06-29 DIAGNOSIS — R5381 Other malaise: Secondary | ICD-10-CM | POA: Diagnosis not present

## 2022-06-29 DIAGNOSIS — E559 Vitamin D deficiency, unspecified: Secondary | ICD-10-CM | POA: Diagnosis not present

## 2022-06-29 DIAGNOSIS — H547 Unspecified visual loss: Secondary | ICD-10-CM | POA: Diagnosis not present

## 2022-06-29 DIAGNOSIS — M316 Other giant cell arteritis: Secondary | ICD-10-CM | POA: Diagnosis not present

## 2022-06-29 DIAGNOSIS — I119 Hypertensive heart disease without heart failure: Secondary | ICD-10-CM | POA: Diagnosis not present

## 2022-07-01 DIAGNOSIS — I119 Hypertensive heart disease without heart failure: Secondary | ICD-10-CM | POA: Diagnosis not present

## 2022-07-01 DIAGNOSIS — M316 Other giant cell arteritis: Secondary | ICD-10-CM | POA: Diagnosis not present

## 2022-07-01 DIAGNOSIS — M6281 Muscle weakness (generalized): Secondary | ICD-10-CM | POA: Diagnosis not present

## 2022-07-01 DIAGNOSIS — H547 Unspecified visual loss: Secondary | ICD-10-CM | POA: Diagnosis not present

## 2022-07-01 DIAGNOSIS — E119 Type 2 diabetes mellitus without complications: Secondary | ICD-10-CM | POA: Diagnosis not present

## 2022-07-01 DIAGNOSIS — R5381 Other malaise: Secondary | ICD-10-CM | POA: Diagnosis not present

## 2022-07-03 DIAGNOSIS — N1832 Chronic kidney disease, stage 3b: Secondary | ICD-10-CM | POA: Diagnosis not present

## 2022-07-03 DIAGNOSIS — I471 Supraventricular tachycardia: Secondary | ICD-10-CM | POA: Diagnosis not present

## 2022-07-03 DIAGNOSIS — M316 Other giant cell arteritis: Secondary | ICD-10-CM | POA: Diagnosis not present

## 2022-07-03 DIAGNOSIS — D649 Anemia, unspecified: Secondary | ICD-10-CM | POA: Diagnosis not present

## 2022-07-03 DIAGNOSIS — H5440 Blindness, one eye, unspecified eye: Secondary | ICD-10-CM | POA: Diagnosis not present

## 2022-07-03 DIAGNOSIS — I679 Cerebrovascular disease, unspecified: Secondary | ICD-10-CM | POA: Diagnosis not present

## 2022-07-03 DIAGNOSIS — R42 Dizziness and giddiness: Secondary | ICD-10-CM | POA: Diagnosis not present

## 2022-07-03 DIAGNOSIS — I152 Hypertension secondary to endocrine disorders: Secondary | ICD-10-CM | POA: Diagnosis not present

## 2022-07-06 DIAGNOSIS — R262 Difficulty in walking, not elsewhere classified: Secondary | ICD-10-CM | POA: Diagnosis not present

## 2022-07-07 ENCOUNTER — Other Ambulatory Visit (HOSPITAL_COMMUNITY): Payer: Self-pay

## 2022-07-08 ENCOUNTER — Other Ambulatory Visit: Payer: Self-pay | Admitting: Physical Medicine and Rehabilitation

## 2022-07-08 ENCOUNTER — Telehealth: Payer: Self-pay | Admitting: *Deleted

## 2022-07-08 DIAGNOSIS — F411 Generalized anxiety disorder: Secondary | ICD-10-CM

## 2022-07-08 MED ORDER — ALPRAZOLAM 2 MG PO TABS
1.0000 mg | ORAL_TABLET | Freq: Two times a day (BID) | ORAL | 3 refills | Status: DC
Start: 2022-07-08 — End: 2022-07-25

## 2022-07-08 NOTE — Telephone Encounter (Signed)
Notified. 

## 2022-07-08 NOTE — Telephone Encounter (Signed)
Erin Jackson called for a refill on her alprazolam.  Please review PMP for this medication for prescribers.

## 2022-07-11 DIAGNOSIS — M7989 Other specified soft tissue disorders: Secondary | ICD-10-CM | POA: Diagnosis not present

## 2022-07-11 DIAGNOSIS — Z7689 Persons encountering health services in other specified circumstances: Secondary | ICD-10-CM | POA: Diagnosis not present

## 2022-07-11 DIAGNOSIS — Z79899 Other long term (current) drug therapy: Secondary | ICD-10-CM | POA: Diagnosis not present

## 2022-07-11 DIAGNOSIS — Z760 Encounter for issue of repeat prescription: Secondary | ICD-10-CM | POA: Diagnosis not present

## 2022-07-11 DIAGNOSIS — R030 Elevated blood-pressure reading, without diagnosis of hypertension: Secondary | ICD-10-CM | POA: Diagnosis not present

## 2022-07-11 DIAGNOSIS — E559 Vitamin D deficiency, unspecified: Secondary | ICD-10-CM | POA: Diagnosis not present

## 2022-07-11 DIAGNOSIS — Z8673 Personal history of transient ischemic attack (TIA), and cerebral infarction without residual deficits: Secondary | ICD-10-CM | POA: Diagnosis not present

## 2022-07-11 DIAGNOSIS — Z0130 Encounter for examination of blood pressure without abnormal findings: Secondary | ICD-10-CM | POA: Diagnosis not present

## 2022-07-11 DIAGNOSIS — E1165 Type 2 diabetes mellitus with hyperglycemia: Secondary | ICD-10-CM | POA: Diagnosis not present

## 2022-07-15 DIAGNOSIS — Z760 Encounter for issue of repeat prescription: Secondary | ICD-10-CM | POA: Diagnosis not present

## 2022-07-15 DIAGNOSIS — R892 Abnormal level of other drugs, medicaments and biological substances in specimens from other organs, systems and tissues: Secondary | ICD-10-CM | POA: Diagnosis not present

## 2022-07-15 DIAGNOSIS — Z79899 Other long term (current) drug therapy: Secondary | ICD-10-CM | POA: Diagnosis not present

## 2022-07-15 DIAGNOSIS — E1165 Type 2 diabetes mellitus with hyperglycemia: Secondary | ICD-10-CM | POA: Diagnosis not present

## 2022-07-15 DIAGNOSIS — D649 Anemia, unspecified: Secondary | ICD-10-CM | POA: Diagnosis not present

## 2022-07-15 DIAGNOSIS — N183 Chronic kidney disease, stage 3 unspecified: Secondary | ICD-10-CM | POA: Diagnosis not present

## 2022-07-15 DIAGNOSIS — Z0130 Encounter for examination of blood pressure without abnormal findings: Secondary | ICD-10-CM | POA: Diagnosis not present

## 2022-07-15 DIAGNOSIS — R030 Elevated blood-pressure reading, without diagnosis of hypertension: Secondary | ICD-10-CM | POA: Diagnosis not present

## 2022-07-16 DIAGNOSIS — Z79899 Other long term (current) drug therapy: Secondary | ICD-10-CM | POA: Diagnosis not present

## 2022-07-17 DIAGNOSIS — N1832 Chronic kidney disease, stage 3b: Secondary | ICD-10-CM | POA: Diagnosis not present

## 2022-07-17 DIAGNOSIS — I5022 Chronic systolic (congestive) heart failure: Secondary | ICD-10-CM | POA: Diagnosis not present

## 2022-07-20 ENCOUNTER — Emergency Department (HOSPITAL_COMMUNITY): Payer: Medicare Other

## 2022-07-20 ENCOUNTER — Encounter (HOSPITAL_COMMUNITY): Payer: Self-pay | Admitting: Emergency Medicine

## 2022-07-20 ENCOUNTER — Inpatient Hospital Stay (HOSPITAL_COMMUNITY)
Admission: EM | Admit: 2022-07-20 | Discharge: 2022-07-27 | DRG: 640 | Disposition: A | Discharge status: Skilled Nursing Facility | Payer: Medicare Other | Attending: Family Medicine | Admitting: Family Medicine

## 2022-07-20 ENCOUNTER — Other Ambulatory Visit: Payer: Self-pay

## 2022-07-20 DIAGNOSIS — R609 Edema, unspecified: Secondary | ICD-10-CM | POA: Diagnosis not present

## 2022-07-20 DIAGNOSIS — Z91148 Patient's other noncompliance with medication regimen for other reason: Secondary | ICD-10-CM

## 2022-07-20 DIAGNOSIS — R1310 Dysphagia, unspecified: Secondary | ICD-10-CM | POA: Diagnosis present

## 2022-07-20 DIAGNOSIS — I160 Hypertensive urgency: Secondary | ICD-10-CM | POA: Diagnosis not present

## 2022-07-20 DIAGNOSIS — F32A Depression, unspecified: Secondary | ICD-10-CM | POA: Diagnosis present

## 2022-07-20 DIAGNOSIS — I6381 Other cerebral infarction due to occlusion or stenosis of small artery: Secondary | ICD-10-CM | POA: Diagnosis not present

## 2022-07-20 DIAGNOSIS — E669 Obesity, unspecified: Secondary | ICD-10-CM | POA: Diagnosis not present

## 2022-07-20 DIAGNOSIS — K2970 Gastritis, unspecified, without bleeding: Secondary | ICD-10-CM | POA: Diagnosis present

## 2022-07-20 DIAGNOSIS — N1832 Chronic kidney disease, stage 3b: Secondary | ICD-10-CM | POA: Diagnosis not present

## 2022-07-20 DIAGNOSIS — Z6838 Body mass index (BMI) 38.0-38.9, adult: Secondary | ICD-10-CM

## 2022-07-20 DIAGNOSIS — D631 Anemia in chronic kidney disease: Secondary | ICD-10-CM | POA: Diagnosis not present

## 2022-07-20 DIAGNOSIS — J690 Pneumonitis due to inhalation of food and vomit: Secondary | ICD-10-CM | POA: Diagnosis present

## 2022-07-20 DIAGNOSIS — G8929 Other chronic pain: Secondary | ICD-10-CM | POA: Diagnosis not present

## 2022-07-20 DIAGNOSIS — I10 Essential (primary) hypertension: Secondary | ICD-10-CM | POA: Diagnosis not present

## 2022-07-20 DIAGNOSIS — M549 Dorsalgia, unspecified: Secondary | ICD-10-CM | POA: Diagnosis present

## 2022-07-20 DIAGNOSIS — E119 Type 2 diabetes mellitus without complications: Secondary | ICD-10-CM

## 2022-07-20 DIAGNOSIS — Z8673 Personal history of transient ischemic attack (TIA), and cerebral infarction without residual deficits: Secondary | ICD-10-CM | POA: Diagnosis not present

## 2022-07-20 DIAGNOSIS — R933 Abnormal findings on diagnostic imaging of other parts of digestive tract: Secondary | ICD-10-CM | POA: Diagnosis not present

## 2022-07-20 DIAGNOSIS — R000 Tachycardia, unspecified: Secondary | ICD-10-CM | POA: Diagnosis not present

## 2022-07-20 DIAGNOSIS — I739 Peripheral vascular disease, unspecified: Secondary | ICD-10-CM | POA: Diagnosis not present

## 2022-07-20 DIAGNOSIS — F419 Anxiety disorder, unspecified: Secondary | ICD-10-CM | POA: Diagnosis present

## 2022-07-20 DIAGNOSIS — E8809 Other disorders of plasma-protein metabolism, not elsewhere classified: Secondary | ICD-10-CM | POA: Diagnosis not present

## 2022-07-20 DIAGNOSIS — E1151 Type 2 diabetes mellitus with diabetic peripheral angiopathy without gangrene: Secondary | ICD-10-CM | POA: Diagnosis not present

## 2022-07-20 DIAGNOSIS — R059 Cough, unspecified: Secondary | ICD-10-CM | POA: Diagnosis not present

## 2022-07-20 DIAGNOSIS — R5381 Other malaise: Secondary | ICD-10-CM

## 2022-07-20 DIAGNOSIS — K219 Gastro-esophageal reflux disease without esophagitis: Secondary | ICD-10-CM | POA: Diagnosis present

## 2022-07-20 DIAGNOSIS — D509 Iron deficiency anemia, unspecified: Secondary | ICD-10-CM | POA: Diagnosis not present

## 2022-07-20 DIAGNOSIS — Z79899 Other long term (current) drug therapy: Secondary | ICD-10-CM

## 2022-07-20 DIAGNOSIS — D649 Anemia, unspecified: Secondary | ICD-10-CM | POA: Diagnosis not present

## 2022-07-20 DIAGNOSIS — Z87891 Personal history of nicotine dependence: Secondary | ICD-10-CM

## 2022-07-20 DIAGNOSIS — Z91018 Allergy to other foods: Secondary | ICD-10-CM

## 2022-07-20 DIAGNOSIS — E1169 Type 2 diabetes mellitus with other specified complication: Secondary | ICD-10-CM

## 2022-07-20 DIAGNOSIS — T380X5A Adverse effect of glucocorticoids and synthetic analogues, initial encounter: Secondary | ICD-10-CM | POA: Diagnosis not present

## 2022-07-20 DIAGNOSIS — H547 Unspecified visual loss: Secondary | ICD-10-CM

## 2022-07-20 DIAGNOSIS — R531 Weakness: Secondary | ICD-10-CM | POA: Diagnosis not present

## 2022-07-20 DIAGNOSIS — Z888 Allergy status to other drugs, medicaments and biological substances status: Secondary | ICD-10-CM

## 2022-07-20 DIAGNOSIS — W19XXXA Unspecified fall, initial encounter: Secondary | ICD-10-CM | POA: Diagnosis not present

## 2022-07-20 DIAGNOSIS — I130 Hypertensive heart and chronic kidney disease with heart failure and stage 1 through stage 4 chronic kidney disease, or unspecified chronic kidney disease: Secondary | ICD-10-CM | POA: Diagnosis present

## 2022-07-20 DIAGNOSIS — Y92009 Unspecified place in unspecified non-institutional (private) residence as the place of occurrence of the external cause: Secondary | ICD-10-CM

## 2022-07-20 DIAGNOSIS — T452X6A Underdosing of vitamins, initial encounter: Secondary | ICD-10-CM | POA: Diagnosis present

## 2022-07-20 DIAGNOSIS — R1319 Other dysphagia: Secondary | ICD-10-CM | POA: Diagnosis not present

## 2022-07-20 DIAGNOSIS — I5022 Chronic systolic (congestive) heart failure: Secondary | ICD-10-CM | POA: Diagnosis not present

## 2022-07-20 DIAGNOSIS — I2510 Atherosclerotic heart disease of native coronary artery without angina pectoris: Secondary | ICD-10-CM | POA: Diagnosis present

## 2022-07-20 DIAGNOSIS — Z794 Long term (current) use of insulin: Secondary | ICD-10-CM

## 2022-07-20 DIAGNOSIS — E1165 Type 2 diabetes mellitus with hyperglycemia: Secondary | ICD-10-CM | POA: Diagnosis not present

## 2022-07-20 DIAGNOSIS — Z20822 Contact with and (suspected) exposure to covid-19: Secondary | ICD-10-CM | POA: Diagnosis present

## 2022-07-20 DIAGNOSIS — E1122 Type 2 diabetes mellitus with diabetic chronic kidney disease: Secondary | ICD-10-CM | POA: Diagnosis not present

## 2022-07-20 DIAGNOSIS — M7989 Other specified soft tissue disorders: Secondary | ICD-10-CM | POA: Diagnosis present

## 2022-07-20 DIAGNOSIS — H5462 Unqualified visual loss, left eye, normal vision right eye: Secondary | ICD-10-CM | POA: Diagnosis present

## 2022-07-20 DIAGNOSIS — Z90710 Acquired absence of both cervix and uterus: Secondary | ICD-10-CM

## 2022-07-20 DIAGNOSIS — K449 Diaphragmatic hernia without obstruction or gangrene: Secondary | ICD-10-CM | POA: Diagnosis present

## 2022-07-20 DIAGNOSIS — Z91048 Other nonmedicinal substance allergy status: Secondary | ICD-10-CM

## 2022-07-20 DIAGNOSIS — E876 Hypokalemia: Secondary | ICD-10-CM | POA: Diagnosis not present

## 2022-07-20 DIAGNOSIS — Z886 Allergy status to analgesic agent status: Secondary | ICD-10-CM

## 2022-07-20 DIAGNOSIS — Z7984 Long term (current) use of oral hypoglycemic drugs: Secondary | ICD-10-CM

## 2022-07-20 DIAGNOSIS — K649 Unspecified hemorrhoids: Secondary | ICD-10-CM | POA: Diagnosis present

## 2022-07-20 DIAGNOSIS — J189 Pneumonia, unspecified organism: Secondary | ICD-10-CM

## 2022-07-20 DIAGNOSIS — Z79891 Long term (current) use of opiate analgesic: Secondary | ICD-10-CM

## 2022-07-20 DIAGNOSIS — K5900 Constipation, unspecified: Secondary | ICD-10-CM | POA: Diagnosis not present

## 2022-07-20 DIAGNOSIS — Z7902 Long term (current) use of antithrombotics/antiplatelets: Secondary | ICD-10-CM

## 2022-07-20 LAB — DIFFERENTIAL
Abs Immature Granulocytes: 0.04 10*3/uL (ref 0.00–0.07)
Basophils Absolute: 0 10*3/uL (ref 0.0–0.1)
Basophils Relative: 0 %
Eosinophils Absolute: 0 10*3/uL (ref 0.0–0.5)
Eosinophils Relative: 0 %
Immature Granulocytes: 0 %
Lymphocytes Relative: 10 %
Lymphs Abs: 1.1 10*3/uL (ref 0.7–4.0)
Monocytes Absolute: 0.3 10*3/uL (ref 0.1–1.0)
Monocytes Relative: 3 %
Neutro Abs: 9.2 10*3/uL — ABNORMAL HIGH (ref 1.7–7.7)
Neutrophils Relative %: 87 %

## 2022-07-20 LAB — COMPREHENSIVE METABOLIC PANEL
ALT: 15 U/L (ref 0–44)
AST: 18 U/L (ref 15–41)
Albumin: 2.7 g/dL — ABNORMAL LOW (ref 3.5–5.0)
Alkaline Phosphatase: 75 U/L (ref 38–126)
Anion gap: 14 (ref 5–15)
BUN: 17 mg/dL (ref 6–20)
CO2: 21 mmol/L — ABNORMAL LOW (ref 22–32)
Calcium: 5.4 mg/dL — CL (ref 8.9–10.3)
Chloride: 105 mmol/L (ref 98–111)
Creatinine, Ser: 1.21 mg/dL — ABNORMAL HIGH (ref 0.44–1.00)
GFR, Estimated: 52 mL/min — ABNORMAL LOW (ref >60)
Glucose, Bld: 230 mg/dL — ABNORMAL HIGH (ref 70–99)
Potassium: 3.9 mmol/L (ref 3.5–5.1)
Sodium: 140 mmol/L (ref 135–145)
Total Bilirubin: 0.9 mg/dL (ref 0.3–1.2)
Total Protein: 5.8 g/dL — ABNORMAL LOW (ref 6.5–8.1)

## 2022-07-20 LAB — CBC
HCT: 35.7 % — ABNORMAL LOW (ref 36.0–46.0)
Hemoglobin: 11.1 g/dL — ABNORMAL LOW (ref 12.0–15.0)
MCH: 28.2 pg (ref 26.0–34.0)
MCHC: 31.1 g/dL (ref 30.0–36.0)
MCV: 90.8 fL (ref 80.0–100.0)
Platelets: 228 10*3/uL (ref 150–400)
RBC: 3.93 MIL/uL (ref 3.87–5.11)
RDW: 16.3 % — ABNORMAL HIGH (ref 11.5–15.5)
WBC: 10.6 10*3/uL — ABNORMAL HIGH (ref 4.0–10.5)
nRBC: 0 % (ref 0.0–0.2)

## 2022-07-20 LAB — I-STAT CHEM 8, ED
BUN: 19 mg/dL (ref 6–20)
Calcium, Ion: 0.7 mmol/L — CL (ref 1.15–1.40)
Chloride: 101 mmol/L (ref 98–111)
Creatinine, Ser: 1 mg/dL (ref 0.44–1.00)
Glucose, Bld: 296 mg/dL — ABNORMAL HIGH (ref 70–99)
HCT: 34 % — ABNORMAL LOW (ref 36.0–46.0)
Hemoglobin: 11.6 g/dL — ABNORMAL LOW (ref 12.0–15.0)
Potassium: 4 mmol/L (ref 3.5–5.1)
Sodium: 139 mmol/L (ref 135–145)
TCO2: 21 mmol/L — ABNORMAL LOW (ref 22–32)

## 2022-07-20 LAB — PROTIME-INR
INR: 1.2 (ref 0.8–1.2)
Prothrombin Time: 15.2 seconds (ref 11.4–15.2)

## 2022-07-20 LAB — TROPONIN I (HIGH SENSITIVITY)
Troponin I (High Sensitivity): 45 ng/L — ABNORMAL HIGH (ref <18)
Troponin I (High Sensitivity): 34 ng/L — ABNORMAL HIGH (ref <18)

## 2022-07-20 LAB — APTT: aPTT: 26 seconds (ref 24–36)

## 2022-07-20 LAB — CBG MONITORING, ED: Glucose-Capillary: 372 mg/dL — ABNORMAL HIGH (ref 70–99)

## 2022-07-20 LAB — I-STAT BETA HCG BLOOD, ED (MC, WL, AP ONLY): I-stat hCG, quantitative: <5 m[IU]/mL (ref <5)

## 2022-07-20 LAB — RESP PANEL BY RT-PCR (FLU A&B, COVID) ARPGX2
Influenza A by PCR: NEGATIVE
Influenza B by PCR: NEGATIVE
SARS Coronavirus 2 by RT PCR: NEGATIVE

## 2022-07-20 LAB — PHOSPHORUS: Phosphorus: 4.2 mg/dL (ref 2.5–4.6)

## 2022-07-20 LAB — VITAMIN D 25 HYDROXY (VIT D DEFICIENCY, FRACTURES): Vit D, 25-Hydroxy: 25.62 ng/mL — ABNORMAL LOW (ref 30–100)

## 2022-07-20 LAB — MAGNESIUM: Magnesium: <0.5 mg/dL — CL (ref 1.7–2.4)

## 2022-07-20 LAB — ETHANOL: Alcohol, Ethyl (B): <10 mg/dL (ref <10)

## 2022-07-20 MED ORDER — MAGNESIUM SULFATE 4 GM/100ML IV SOLN
4.0000 g | Freq: Once | INTRAVENOUS | Status: AC
Start: 2022-07-20 — End: 2022-07-20
  Administered 2022-07-20: 4 g via INTRAVENOUS
  Filled 2022-07-20: qty 100

## 2022-07-20 MED ORDER — CLOPIDOGREL BISULFATE 75 MG PO TABS
75.0000 mg | ORAL_TABLET | Freq: Every day | ORAL | Status: DC
  Administered 2022-07-21 – 2022-07-27 (×7): 75 mg via ORAL
  Filled 2022-07-20 (×7): qty 1

## 2022-07-20 MED ORDER — ENOXAPARIN SODIUM 40 MG/0.4ML IJ SOSY
40.0000 mg | PREFILLED_SYRINGE | INTRAMUSCULAR | Status: DC
  Administered 2022-07-20 – 2022-07-26 (×7): 40 mg via SUBCUTANEOUS
  Filled 2022-07-20 (×7): qty 0.4

## 2022-07-20 MED ORDER — AMOXICILLIN-POT CLAVULANATE 875-125 MG PO TABS
1.0000 | ORAL_TABLET | Freq: Two times a day (BID) | ORAL | Status: DC
  Administered 2022-07-20 – 2022-07-24 (×8): 1 via ORAL
  Filled 2022-07-20 (×8): qty 1

## 2022-07-20 MED ORDER — PROCHLORPERAZINE MALEATE 5 MG PO TABS: 5.0000 mg | ORAL_TABLET | Freq: Four times a day (QID) | ORAL | Status: DC | PRN

## 2022-07-20 MED ORDER — CLONIDINE HCL 0.1 MG PO TABS
0.2000 mg | ORAL_TABLET | Freq: Two times a day (BID) | ORAL | Status: DC
  Administered 2022-07-20 – 2022-07-27 (×14): 0.2 mg via ORAL
  Filled 2022-07-20 (×2): qty 2
  Filled 2022-07-20: qty 1
  Filled 2022-07-20 (×11): qty 2

## 2022-07-20 MED ORDER — VITAMIN D (ERGOCALCIFEROL) 1.25 MG (50000 UNIT) PO CAPS: 50000.0000 [IU] | ORAL_CAPSULE | ORAL | Status: DC

## 2022-07-20 MED ORDER — CALCIUM CARBONATE 1250 (500 CA) MG PO TABS
1.0000 | ORAL_TABLET | Freq: Three times a day (TID) | ORAL | Status: DC
  Administered 2022-07-21 – 2022-07-23 (×9): 1250 mg via ORAL
  Filled 2022-07-20 (×9): qty 1

## 2022-07-20 MED ORDER — ALPRAZOLAM 0.5 MG PO TABS
1.0000 mg | ORAL_TABLET | Freq: Two times a day (BID) | ORAL | Status: DC
  Administered 2022-07-20 – 2022-07-27 (×14): 1 mg via ORAL
  Filled 2022-07-20 (×5): qty 2
  Filled 2022-07-20: qty 4
  Filled 2022-07-20 (×8): qty 2

## 2022-07-20 MED ORDER — CALCIUM GLUCONATE-NACL 1-0.675 GM/50ML-% IV SOLN
1.0000 g | Freq: Once | INTRAVENOUS | Status: AC
  Administered 2022-07-20: 1000 mg via INTRAVENOUS
  Filled 2022-07-20: qty 50

## 2022-07-20 MED ORDER — LOSARTAN POTASSIUM 50 MG PO TABS
100.0000 mg | ORAL_TABLET | Freq: Every day | ORAL | Status: DC
  Administered 2022-07-21 – 2022-07-27 (×7): 100 mg via ORAL
  Filled 2022-07-20 (×7): qty 2

## 2022-07-20 MED ORDER — ATORVASTATIN CALCIUM 80 MG PO TABS
80.0000 mg | ORAL_TABLET | Freq: Every day | ORAL | Status: DC
  Administered 2022-07-21 – 2022-07-27 (×7): 80 mg via ORAL
  Filled 2022-07-20 (×7): qty 1

## 2022-07-20 MED ORDER — SODIUM CHLORIDE 0.9% FLUSH
3.0000 mL | Freq: Once | INTRAVENOUS | Status: AC
  Administered 2022-07-20: 3 mL via INTRAVENOUS

## 2022-07-20 MED ORDER — PREDNISONE 50 MG PO TABS
50.0000 mg | ORAL_TABLET | Freq: Every day | ORAL | Status: DC
  Administered 2022-07-22: 50 mg via ORAL
  Filled 2022-07-20 (×2): qty 1

## 2022-07-20 MED ORDER — PANTOPRAZOLE SODIUM 40 MG PO TBEC
40.0000 mg | DELAYED_RELEASE_TABLET | Freq: Every day | ORAL | Status: DC
  Administered 2022-07-21 – 2022-07-23 (×3): 40 mg via ORAL
  Filled 2022-07-20 (×3): qty 1

## 2022-07-20 MED ORDER — TRAZODONE HCL 50 MG PO TABS
150.0000 mg | ORAL_TABLET | Freq: Every day | ORAL | Status: DC
  Administered 2022-07-20 – 2022-07-26 (×7): 150 mg via ORAL
  Filled 2022-07-20 (×4): qty 1
  Filled 2022-07-20: qty 3
  Filled 2022-07-20 (×2): qty 1

## 2022-07-20 MED ORDER — HYDROCODONE-ACETAMINOPHEN 10-325 MG PO TABS
1.0000 | ORAL_TABLET | Freq: Three times a day (TID) | ORAL | Status: DC | PRN
  Administered 2022-07-20 – 2022-07-27 (×9): 1 via ORAL
  Filled 2022-07-20 (×10): qty 1

## 2022-07-20 MED ORDER — ACETAMINOPHEN 325 MG PO TABS: 650.0000 mg | ORAL_TABLET | ORAL | Status: DC | PRN

## 2022-07-20 MED ORDER — DOXYCYCLINE HYCLATE 100 MG PO TABS: 100.0000 mg | ORAL_TABLET | Freq: Two times a day (BID) | ORAL | Status: DC

## 2022-07-20 MED ORDER — MECLIZINE HCL 12.5 MG PO TABS: 12.5000 mg | ORAL_TABLET | Freq: Two times a day (BID) | ORAL | Status: DC | PRN

## 2022-07-20 MED ORDER — METHOCARBAMOL 500 MG PO TABS
500.0000 mg | ORAL_TABLET | Freq: Four times a day (QID) | ORAL | Status: DC | PRN
  Administered 2022-07-20 – 2022-07-26 (×6): 500 mg via ORAL
  Filled 2022-07-20 (×6): qty 1

## 2022-07-20 MED ORDER — SODIUM CHLORIDE 0.9 % IV SOLN
1.0000 g | Freq: Once | INTRAVENOUS | Status: AC
  Administered 2022-07-20: 1 g via INTRAVENOUS
  Filled 2022-07-20: qty 10

## 2022-07-20 MED ORDER — ONDANSETRON HCL 4 MG PO TABS
4.0000 mg | ORAL_TABLET | Freq: Four times a day (QID) | ORAL | Status: DC
  Administered 2022-07-20 – 2022-07-27 (×20): 4 mg via ORAL
  Filled 2022-07-20 (×23): qty 1

## 2022-07-20 MED ORDER — INSULIN ASPART 100 UNIT/ML IJ SOLN
0.0000 [IU] | Freq: Three times a day (TID) | INTRAMUSCULAR | Status: DC
  Administered 2022-07-21 (×2): 11 [IU] via SUBCUTANEOUS
  Administered 2022-07-22: 4 [IU] via SUBCUTANEOUS
  Administered 2022-07-22: 7 [IU] via SUBCUTANEOUS
  Administered 2022-07-22 – 2022-07-23 (×2): 20 [IU] via SUBCUTANEOUS
  Administered 2022-07-23 (×2): 4 [IU] via SUBCUTANEOUS
  Administered 2022-07-24: 7 [IU] via SUBCUTANEOUS
  Administered 2022-07-24: 11 [IU] via SUBCUTANEOUS
  Administered 2022-07-25: 4 [IU] via SUBCUTANEOUS
  Administered 2022-07-25: 15 [IU] via SUBCUTANEOUS
  Administered 2022-07-25: 7 [IU] via SUBCUTANEOUS
  Administered 2022-07-26: 11 [IU] via SUBCUTANEOUS
  Administered 2022-07-26: 4 [IU] via SUBCUTANEOUS
  Administered 2022-07-26: 20 [IU] via SUBCUTANEOUS
  Administered 2022-07-27: 4 [IU] via SUBCUTANEOUS

## 2022-07-20 MED ORDER — FERROUS GLUCONATE 324 (38 FE) MG PO TABS
324.0000 mg | ORAL_TABLET | ORAL | Status: DC
  Administered 2022-07-21 – 2022-07-27 (×4): 324 mg via ORAL
  Filled 2022-07-20 (×5): qty 1

## 2022-07-20 NOTE — ED Provider Triage Note (Signed)
Emergency Medicine Provider Triage Evaluation Note  Erin Jackson , a 56 y.o. female  was evaluated in triage.  Pt complains of not feeling well including chest pain since yesterday.  Denies any change in symptoms today other than persistently not feeling well.  Endorses history of prior CVA but not able to provide much additional details.  Exam without focal deficits.  Reports generalized weakness.  Denies recent cough fever, or dysuria.   Review of Systems  Positive: As above Negative: As above  Physical Exam  BP (!) 166/124 (BP Location: Right Arm)   Pulse (!) 112   Temp 98.5 F (36.9 C) (Oral)   Resp 17   SpO2 98%  Gen:   Awake, no distress   Resp:  Normal effort  MSK:   Moves extremities without difficulty  Other:  Cranial nerves III through XII intact.  Without facial droop.  Tongue midline.  Generalized weakness present in bilateral upper and lower extremities.  This is symmetrical.  Sensation intact bilaterally in upper and lower extremities.   Medical Decision Making  Medically screening exam initiated at 1:02 PM.  Appropriate orders placed.  HALEY FUERSTENBERG was informed that the remainder of the evaluation will be completed by another provider, this initial triage assessment does not replace that evaluation, and the importance of remaining in the ED until their evaluation is complete.     Marita Kansas, PA-C 07/20/22 1304

## 2022-07-20 NOTE — H&P (Addendum)
History and Physical    Erin Jackson EUM:[id number] DOB: Jul 08, 1966 DOA: 07/20/2022  PCP: Pcp, No (Confirm with patient/family/NH records and if not entered, this has to be entered at [facility] point of entry) Patient coming from: Home  I have personally briefly reviewed patient's old medical records in [place]  Chief Complaint: Arms and legs numbness and cramping  HPI: Erin Jackson is a 56 y.o. female with medical history significant of IIDM, HTN, anxiety/depression, chronic vitamin D deficiency, presented with new onset of bilateral arms and fingers numbness and cramping and bilateral calf cramping.  Patient has diagnosed vitamin D deficiency however has not been not compliant with vitamin D supplement.  Yesterday evening, after dinner, patient developed bilateral forearm and hands and all 10 fingers numbness and overnight she developed cramping pain of bilateral forearm and hands.  This morning, she woke up and symptoms of normal hands numbness and cramping persisted, and when she also developed bilateral calf cramping. "Thinking of I might have a stroke".  No chest pain no abdominal pain.  Patient also reported productive cough with whitish phlegm for the last 2 to 3 days, no fever or chills.  At baseline, patient has had episodes of coughing and choking after eating solid food.  No history of aspiration denies any fever or chills.  ED Course: Blood pressure elevated.  Severe hypocalcemia, CA 5.4, corrected Ca 6.0.  CT head negative for acute finding but only chronic lacunar stroke.  Patient received IV calcium.  Review of Systems: As per HPI otherwise 14 point review of systems negative.    Past Medical History:  Diagnosis Date   Anxiety    Chronic back pain    Diabetes mellitus without complication ([place])    GERD (gastroesophageal reflux disease)    Hypertension     Past Surgical History:  Procedure Laterality Date   ABDOMINAL HYSTERECTOMY     ARTERY BIOPSY Left  06/02/2022   Procedure: BIOPSY TEMPORAL ARTERY;  Surgeon: Waynetta Sandy, MD;  Location: [place];  Service: Vascular;  Laterality: Left;   BREAST LUMPECTOMY Left 03/25/2022   Procedure: LEFT BREAST LUMPECTOMY;  Surgeon: Jovita Kussmaul, MD;  Location: [place];  Service: General;  Laterality: Left;   BREAST REDUCTION SURGERY Bilateral 08/15/2018   Procedure: BILATERAL MAMMARY REDUCTION  (BREAST);  Surgeon: Cristine Polio, MD;  Location: [place];  Service: Plastics;  Laterality: Bilateral;   BREAST REDUCTION SURGERY Left 09/25/2019   Procedure: EXCISION OF FAT NECROSIS OF LEFT BREAST;  Surgeon: Cristine Polio, MD;  Location: [place];  Service: Plastics;  Laterality: Left;   CARPAL TUNNEL RELEASE Right 06/17/2020   right thumb CMC arthroplasty with tendon transfer and right carpal tunnel release.    CERVICAL SPINE SURGERY  2015   has a metal plate   FOOT SURGERY Left 2014   HAND SURGERY     LEFT HEART CATH AND CORONARY ANGIOGRAPHY N/A 04/09/2022   Procedure: LEFT HEART CATH AND CORONARY ANGIOGRAPHY;  Surgeon: Dixie Dials, MD;  Location: [facility];  Service: Cardiovascular;  Laterality: N/A;   REDUCTION MAMMAPLASTY Bilateral 07/2018     reports that she quit smoking about 2 months ago. Her smoking use included cigarettes. She smoked an average of .5 packs per day. She has never used smokeless tobacco. She reports current alcohol use. She reports that she does not currently use drugs after having used the following drugs: Marijuana.  Allergies  Allergen Reactions  Cucumber Extract Anaphylaxis   Molds & Smuts Shortness Of Breath   Norvasc [Amlodipine] Other (See Comments)    Muscle became very tight   Other Anaphylaxis and Other (See Comments)    Tree nuts, walnuts, peanuts, pickles   Apple Juice Hives   Aspirin Rash   Ibuprofen Rash   Niacin And Related Rash    Family History  Problem Relation Age of Onset    Breast cancer Maternal Grandmother    Breast cancer Maternal Aunt     Prior to Admission medications   Medication Sig Start Date End Date Taking? Authorizing Provider  acetaminophen (TYLENOL) 325 MG tablet Take 2 tablets (650 mg total) by mouth every 4 (four) hours as needed for fever or mild pain. 05/07/22   Setzer, Edman Circle, PA-C  allopurinol (ZYLOPRIM) 100 MG tablet Take 100 mg by mouth daily. Patient not taking: Reported on 06/15/2022 05/03/22   [provider]  alprazolam Duanne Moron) 2 MG tablet Take 0.5 tablets (1 mg total) by mouth 2 (two) times daily. 07/08/22   Raulkar, Clide Deutscher, MD  atorvastatin (LIPITOR) 80 MG tablet Take 1 tablet (80 mg total) by mouth daily. Patient taking differently: Take 80 mg by mouth at bedtime. 05/07/22   Setzer, Edman Circle, PA-C  cetirizine (ZYRTEC ALLERGY) 10 MG tablet Take 1 tablet (10 mg total) by mouth daily. Patient taking differently: Take 10 mg by mouth daily as needed for allergies. 04/14/22   Valarie Merino, MD  cloNIDine (CATAPRES) 0.1 MG tablet Take 1 tablet (0.1 mg total) by mouth 2 (two) times daily. Patient taking differently: Take 0.2 mg by mouth 2 (two) times daily. 05/07/22   Setzer, Edman Circle, PA-C  clopidogrel (PLAVIX) 75 MG tablet Take 1 tablet (75 mg total) by mouth daily. 05/07/22   Setzer, Edman Circle, PA-C  EPINEPHRINE 0.3 mg/0.3 mL IJ SOAJ injection Inject 0.3 mLs (0.3 mg total) into the muscle once as needed (allergic reaction). Patient taking differently: Inject 0.3 mg into the muscle as needed for anaphylaxis (allergic reaction). 10/04/18   Fabian November, MD  ferrous gluconate (FERGON) 324 MG tablet Take 324 mg by mouth every other day.    [provider]  HYDROcodone-acetaminophen (NORCO) 10-325 MG tablet Take 1 tablet by mouth 3 (three) times daily as needed for moderate pain. 06/08/22   Wouk, Ailene Rud, MD  hydrOXYzine (ATARAX) 25 MG tablet Take 25 mg by mouth 3 (three) times daily. Patient not taking: Reported on 06/15/2022  04/17/22   [provider]  insulin glargine (LANTUS) 100 UNIT/ML injection Inject 0.25 mLs (25 Units total) into the skin at bedtime. Patient not taking: Reported on 06/15/2022 06/08/22   Wouk, Ailene Rud, MD  insulin lispro (HUMALOG) 100 UNIT/ML injection Inject 2-12 Units into the skin 2 (two) times daily. Per sliding scale, if 70 to 200= 0 units, 201 to 250= 2 units, 251 to 300= 4 units 301 to 350= 6 units, 351 to 400= 8 units, 401 to 450= 10 units, 451 to 600= 12 units    [provider]  Lancets (ONETOUCH DELICA PLUS [id number]) [place] Apply topically daily. 05/03/22   [provider]  losartan (COZAAR) 100 MG tablet Take 1 tablet (100 mg total) by mouth daily. 06/09/22   Wouk, Ailene Rud, MD  meclizine (ANTIVERT) 12.5 MG tablet Take 1 tablet (12.5 mg total) by mouth 2 (two) times daily as needed for dizziness. 05/07/22   Setzer, Edman Circle, PA-C  meclizine (ANTIVERT) 25 MG tablet Take 1 tablet (  25 mg total) by mouth 3 (three) times daily as needed for dizziness. 06/15/22   Valarie Merino, MD  metFORMIN (GLUCOPHAGE) 500 MG tablet Take 1 tablet (500 mg total) by mouth 2 (two) times daily with a meal. 05/08/22   Setzer, Edman Circle, PA-C  methocarbamol (ROBAXIN) 500 MG tablet Take 1 tablet (500 mg total) by mouth every 6 (six) hours as needed for muscle spasms. 05/19/22   Jennye Boroughs, MD  NARCAN 4 MG/0.1ML LIQD nasal spray kit Place 0.4 mg into the nose as needed (opoid overdose). 01/08/20   [provider]  nitroGLYCERIN (NITROSTAT) 0.4 MG SL tablet Place 1 tablet (0.4 mg total) under the tongue every 5 (five) minutes as needed for chest pain. 04/10/22   Elgergawy, Silver Huguenin, MD  omeprazole (PRILOSEC) 20 MG capsule Take 20 mg by mouth 2 (two) times daily. 05/09/22   [provider]  ondansetron (ZOFRAN) 4 MG tablet Take 1 tablet (4 mg total) by mouth every 6 (six) hours. 06/15/22   Valarie Merino, MD  [facility] VERIO test strip daily. 05/03/22   [provider]  prochlorperazine (COMPAZINE) 5 MG tablet Take 1-2 tablets (5-10 mg total) by mouth every 6 (six) hours as needed for nausea. Patient taking differently: Take 5 mg by mouth every 6 (six) hours as needed for nausea. 05/07/22   Setzer, Edman Circle, PA-C  traZODone (DESYREL) 150 MG tablet Take 1 tablet (150 mg total) by mouth at bedtime. 05/07/22   Setzer, Edman Circle, PA-C  Vitamin D, Ergocalciferol, (DRISDOL) 1.25 MG (50000 UNIT) CAPS capsule Take 1 capsule (50,000 Units total) by mouth every 7 (seven) days. Patient taking differently: Take 50,000 Units by mouth every 7 (seven) days. Saturday 05/08/22   Barbie Banner, PA-C    Physical Exam: Vitals:   07/20/22 1241 07/20/22 1436  BP: (!) 166/124 (!) 165/120  Pulse: (!) 112 100  Resp: 17 16  Temp: 98.5 F (36.9 C) 98 F (36.7 C)  TempSrc: Oral Oral  SpO2: 98% 99%    Constitutional: NAD, calm, comfortable Vitals:   07/20/22 1241 07/20/22 1436  BP: (!) 166/124 (!) 165/120  Pulse: (!) 112 100  Resp: 17 16  Temp: 98.5 F (36.9 C) 98 F (36.7 C)  TempSrc: Oral Oral  SpO2: 98% 99%   Eyes: PERRL, lids and conjunctivae normal ENMT: Mucous membranes are moist. Posterior pharynx clear of any exudate or lesions.Normal dentition.  Neck: normal, supple, no masses, no thyromegaly Respiratory: clear to auscultation bilaterally, no wheezing, coarse crackles on right lower field. Normal respiratory effort. No accessory muscle use.  Cardiovascular: Regular rate and rhythm, no murmurs / rubs / gallops. No extremity edema. 2+ pedal pulses. No carotid bruits.  Abdomen: no tenderness, no masses palpated. No hepatosplenomegaly. Bowel sounds positive.  Musculoskeletal: no clubbing / cyanosis. No joint deformity upper and lower extremities. Good ROM, no contractures. Normal muscle tone.  Skin: no rashes, lesions, ulcers. No induration Neurologic: CN 2-12 grossly intact. Sensation intact, DTR normal. Strength 5/5 in all 4.  Psychiatric: Normal  judgment and insight. Alert and oriented x 3. Normal mood.     Labs on Admission: I have personally reviewed following labs and imaging studies  CBC: Recent Labs  Lab 07/20/22 1307  WBC 10.6*  NEUTROABS 9.2*  HGB 11.1*  HCT 35.7*  MCV 90.8  PLT 381   Basic Metabolic Panel: Recent Labs  Lab 07/20/22 1307  NA 140  K 3.9  CL 105  CO2 21*  GLUCOSE 230*  BUN 17  CREATININE 1.21*  CALCIUM 5.4*   GFR: CrCl cannot be calculated (Unknown ideal weight.). Liver Function Tests: Recent Labs  Lab 07/20/22 1307  AST 18  ALT 15  ALKPHOS 75  BILITOT 0.9  PROT 5.8*  ALBUMIN 2.7*   No results for input(s): "LIPASE", "AMYLASE" in the last 168 hours. No results for input(s): "AMMONIA" in the last 168 hours. Coagulation Profile: Recent Labs  Lab 07/20/22 1307  INR 1.2   Cardiac Enzymes: No results for input(s): "CKTOTAL", "CKMB", "CKMBINDEX", "TROPONINI" in the last 168 hours. BNP (last 3 results) No results for input(s): "PROBNP" in the last 8760 hours. HbA1C: No results for input(s): "HGBA1C" in the last 72 hours. CBG: No results for input(s): "GLUCAP" in the last 168 hours. Lipid Profile: No results for input(s): "CHOL", "HDL", "LDLCALC", "TRIG", "CHOLHDL", "LDLDIRECT" in the last 72 hours. Thyroid Function Tests: No results for input(s): "TSH", "T4TOTAL", "FREET4", "T3FREE", "THYROIDAB" in the last 72 hours. Anemia Panel: No results for input(s): "VITAMINB12", "FOLATE", "FERRITIN", "TIBC", "IRON", "RETICCTPCT" in the last 72 hours. Urine analysis:    Component Value Date/Time   COLORURINE STRAW (A) 06/14/2022 2128   APPEARANCEUR CLEAR 06/14/2022 2128   LABSPEC 1.008 06/14/2022 2128   PHURINE 6.0 06/14/2022 2128   GLUCOSEU NEGATIVE 06/14/2022 2128   HGBUR SMALL (A) 06/14/2022 2128   BILIRUBINUR NEGATIVE 06/14/2022 2128   [place] NEGATIVE 06/14/2022 2128   PROTEINUR 100 (A) 06/14/2022 2128   NITRITE NEGATIVE 06/14/2022 2128   LEUKOCYTESUR NEGATIVE  06/14/2022 2128    Radiological Exams on Admission: DG Chest 2 View  Result Date: 07/20/2022 CLINICAL DATA:  Cough. EXAM: CHEST - 2 VIEW COMPARISON:  Chest x-ray 06/19/2022 FINDINGS: Lung volumes are low. There are some patchy opacities in the right lower lobe. There is stable scarring in the left mid lung. The heart is mildly enlarged, unchanged. There is no pleural effusion or pneumothorax. No acute fractures are seen. IMPRESSION: 1. Right lower lobe atelectasis/airspace disease. 2. Stable cardiomegaly. Electronically Signed   By: Ronney Asters M.D.   On: 07/20/2022 16:07   CT HEAD WO CONTRAST  Result Date: 07/20/2022 CLINICAL DATA:  Patient complains of awakening with weakness this morning. EXAM: CT HEAD WITHOUT CONTRAST TECHNIQUE: Contiguous axial images were obtained from the base of the skull through the vertex without intravenous contrast. RADIATION DOSE REDUCTION: This exam was performed according to the departmental dose-optimization program which includes automated exposure control, adjustment of the mA and/or kV according to patient size and/or use of iterative reconstruction technique. COMPARISON:  06/15/2022 FINDINGS: Brain: No evidence of acute infarction, hemorrhage, hydrocephalus, extra-axial collection or mass lesion/mass effect. Chronic lacunar infarct within the left basal ganglia and right-side of pons identified. Vascular: No hyperdense vessel or unexpected calcification. Skull: Normal. Negative for fracture or focal lesion. Sinuses/Orbits: No acute finding. Other: None IMPRESSION: 1. No acute intracranial abnormalities. 2. Chronic lacunar infarcts within the left basal ganglia and right-side of pons. Electronically Signed   By: Kerby Moors M.D.   On: 07/20/2022 14:43    EKG: Independently reviewed.  Sinus rhythm, borderline prolonged QTc, no PR interval changes.  Assessment/Plan Principal Problem:   Hypocalcemia  (please populate well all problems here in Problem List. (For  example, if patient is on BP meds at home and you resume or decide to hold them, it is a problem that needs to be her. Same for CAD, COPD, HLD and so on)  Severe hypocalcemia -Secondary to noncompliance with vitamin D  supplement and history of vitamin D deficiency -IV supplement, starting tonight, p.o. Os-Cal D and resume Vitamin D supplement. -Other Ddx, stroke unlikely given her symptoms are bilateral. Patient had 3 brain MRIs last month. -25D, PTH, Magnesium and phosphorus level pending  Aspiration pneumonia -She has a new cough, no infiltrate on x-ray and history compatible with aspiration of solid food. -No significant hypoxia, change antibiotics to Augmentin -Speech evaluation  Recent left eye vision loss -According to discharge summary on June 19, patient developed left vision loss on last admission and stroke was ruled out.  And as patient's inflammation marker increased, giant cell arteritis was entertained and patient underwent temporal artery biopsy.  And patient was discharged on prednisone 60 mg daily.  Which she is still taking.  Reviewed pathology showed negative for giant cell arteritis, will start taper her prednisone, starting 50 mg daily tomorrow, aiming to taper to discontinue in 7 to 10 days.  Explained to the patient and reviewed the pathology study along with her, who expressed understanding and agreed. -Still recommend she go see outpatient ophthalmology which she has not done since last hospitalization.  HTN -Uncontrolled -Continue clonidine and losartan  IDDM, with hyperglycemia -Continue insulin sliding scale -Check A1c  PVD -Continue Plavix and statin  DVT prophylaxis: Lovenox Code Status: Full code Family Communication: Son over the phone, all questions answered. Disposition Plan: Expect less than 2 midnight hospital stay Consults called: NOne Admission status: Tele obs   Lequita Halt MD [facility] Pager [phone]  07/20/2022, 6:44 PM

## 2022-07-20 NOTE — ED Triage Notes (Signed)
Patient complains of awakening with weakness this am. States I think I am having another stroke. Patient has had previous stroke with reported left sided deficits-refuses to open eyes. Swelling noted to eyelids

## 2022-07-20 NOTE — ED Provider Notes (Signed)
[facility] EMERGENCY DEPARTMENT Provider Note   CSN: [id number] Arrival date & time: 07/20/22  1216     History  No chief complaint on file.   Erin Jackson is a 56 y.o. female.  HPI 56 year old female presents with facial numbness and concern for stroke.  She woke up this way at 6 AM.  She has been feeling generally weak for a couple days.  No fevers.  Has had cough and some shortness of breath since yesterday.  Feels generally weak but no new unilateral weakness besides her chronic mild left-sided weakness from previous stroke.  Also has chronic left vision loss.  She thinks her medications were adjusted the last time she was admitted but is not sure how.  She had a couple episodes of diarrhea this morning but otherwise has not had any preceding illness.  Home Medications Prior to Admission medications   Medication Sig Start Date End Date Taking? Authorizing Provider  acetaminophen (TYLENOL) 325 MG tablet Take 2 tablets (650 mg total) by mouth every 4 (four) hours as needed for fever or mild pain. 05/07/22   Setzer, Edman Circle, PA-C  allopurinol (ZYLOPRIM) 100 MG tablet Take 100 mg by mouth daily. Patient not taking: Reported on 06/15/2022 05/03/22   [provider]  alprazolam Duanne Moron) 2 MG tablet Take 0.5 tablets (1 mg total) by mouth 2 (two) times daily. 07/08/22   Raulkar, Clide Deutscher, MD  atorvastatin (LIPITOR) 80 MG tablet Take 1 tablet (80 mg total) by mouth daily. Patient taking differently: Take 80 mg by mouth at bedtime. 05/07/22   Setzer, Edman Circle, PA-C  cetirizine (ZYRTEC ALLERGY) 10 MG tablet Take 1 tablet (10 mg total) by mouth daily. Patient taking differently: Take 10 mg by mouth daily as needed for allergies. 04/14/22   Valarie Merino, MD  cloNIDine (CATAPRES) 0.1 MG tablet Take 1 tablet (0.1 mg total) by mouth 2 (two) times daily. Patient taking differently: Take 0.2 mg by mouth 2 (two) times daily. 05/07/22   Setzer, Edman Circle, PA-C  clopidogrel  (PLAVIX) 75 MG tablet Take 1 tablet (75 mg total) by mouth daily. 05/07/22   Setzer, Edman Circle, PA-C  EPINEPHRINE 0.3 mg/0.3 mL IJ SOAJ injection Inject 0.3 mLs (0.3 mg total) into the muscle once as needed (allergic reaction). Patient taking differently: Inject 0.3 mg into the muscle as needed for anaphylaxis (allergic reaction). 10/04/18   Fabian November, MD  ferrous gluconate (FERGON) 324 MG tablet Take 324 mg by mouth every other day.    [provider]  HYDROcodone-acetaminophen (NORCO) 10-325 MG tablet Take 1 tablet by mouth 3 (three) times daily as needed for moderate pain. 06/08/22   Wouk, Ailene Rud, MD  hydrOXYzine (ATARAX) 25 MG tablet Take 25 mg by mouth 3 (three) times daily. Patient not taking: Reported on 06/15/2022 04/17/22   [provider]  insulin glargine (LANTUS) 100 UNIT/ML injection Inject 0.25 mLs (25 Units total) into the skin at bedtime. Patient not taking: Reported on 06/15/2022 06/08/22   Wouk, Ailene Rud, MD  insulin lispro (HUMALOG) 100 UNIT/ML injection Inject 2-12 Units into the skin 2 (two) times daily. Per sliding scale, if 70 to 200= 0 units, 201 to 250= 2 units, 251 to 300= 4 units 301 to 350= 6 units, 351 to 400= 8 units, 401 to 450= 10 units, 451 to 600= 12 units    [provider]  Lancets (ONETOUCH DELICA PLUS [id number]) [place] Apply topically daily. 05/03/22   [provider]  losartan (COZAAR) 100 MG tablet Take 1 tablet (100 mg total) by mouth daily. 06/09/22   Wouk, Ailene Rud, MD  meclizine (ANTIVERT) 12.5 MG tablet Take 1 tablet (12.5 mg total) by mouth 2 (two) times daily as needed for dizziness. 05/07/22   Setzer, Edman Circle, PA-C  meclizine (ANTIVERT) 25 MG tablet Take 1 tablet (25 mg total) by mouth 3 (three) times daily as needed for dizziness. 06/15/22   Valarie Merino, MD  metFORMIN (GLUCOPHAGE) 500 MG tablet Take 1 tablet (500 mg total) by mouth 2 (two) times daily with a meal. 05/08/22   Setzer, Edman Circle, PA-C   methocarbamol (ROBAXIN) 500 MG tablet Take 1 tablet (500 mg total) by mouth every 6 (six) hours as needed for muscle spasms. 05/19/22   Jennye Boroughs, MD  NARCAN 4 MG/0.1ML LIQD nasal spray kit Place 0.4 mg into the nose as needed (opoid overdose). 01/08/20   [provider]  nitroGLYCERIN (NITROSTAT) 0.4 MG SL tablet Place 1 tablet (0.4 mg total) under the tongue every 5 (five) minutes as needed for chest pain. 04/10/22   Elgergawy, Silver Huguenin, MD  omeprazole (PRILOSEC) 20 MG capsule Take 20 mg by mouth 2 (two) times daily. 05/09/22   [provider]  ondansetron (ZOFRAN) 4 MG tablet Take 1 tablet (4 mg total) by mouth every 6 (six) hours. 06/15/22   Valarie Merino, MD  [facility] VERIO test strip daily. 05/03/22   [provider]  prochlorperazine (COMPAZINE) 5 MG tablet Take 1-2 tablets (5-10 mg total) by mouth every 6 (six) hours as needed for nausea. Patient taking differently: Take 5 mg by mouth every 6 (six) hours as needed for nausea. 05/07/22   Setzer, Edman Circle, PA-C  traZODone (DESYREL) 150 MG tablet Take 1 tablet (150 mg total) by mouth at bedtime. 05/07/22   Setzer, Edman Circle, PA-C  Vitamin D, Ergocalciferol, (DRISDOL) 1.25 MG (50000 UNIT) CAPS capsule Take 1 capsule (50,000 Units total) by mouth every 7 (seven) days. Patient taking differently: Take 50,000 Units by mouth every 7 (seven) days. Saturday 05/08/22   Barbie Banner, PA-C      Allergies    Cucumber extract, Molds & smuts, Norvasc [amlodipine], Other, Apple juice, Aspirin, Ibuprofen, and Niacin and related    Review of Systems   Review of Systems  Constitutional:  Negative for fever.  Respiratory:  Positive for cough and shortness of breath.   Gastrointestinal:  Negative for diarrhea and vomiting.  Neurological:  Positive for tremors, weakness and numbness. Negative for headaches.    Physical Exam Updated Vital Signs BP (!) 165/120 (BP Location: Left Arm)   Pulse 100   Temp 98 F (36.7 C)  (Oral)   Resp 16   SpO2 99%  Physical Exam Vitals and nursing note reviewed.  Constitutional:      Appearance: She is well-developed.  HENT:     Head: Normocephalic and atraumatic.  Eyes:     Extraocular Movements: Extraocular movements intact.     Pupils: Pupils are equal, round, and reactive to light.  Cardiovascular:     Rate and Rhythm: Normal rate and regular rhythm.     Heart sounds: Normal heart sounds.  Pulmonary:     Effort: Pulmonary effort is normal.     Breath sounds: Normal breath sounds.  Abdominal:     Palpations: Abdomen is soft.     Tenderness: There is no abdominal tenderness.  Skin:    General: Skin is warm and dry.  Neurological:     Mental Status: She is alert and oriented to person, place, and time.     Comments: Positive chvostek's sign bilaterally.  Both eyelids seem to be tremoring/twitching.  Some tremor in the hands.  Otherwise has diffuse generalized weakness but seems symmetric.     ED Results / Procedures / Treatments   Labs (all labs ordered are listed, but only abnormal results are displayed) Labs Reviewed  CBC - Abnormal; Notable for the following components:      Result Value   WBC 10.6 (*)    Hemoglobin 11.1 (*)    HCT 35.7 (*)    RDW 16.3 (*)    All other components within normal limits  DIFFERENTIAL - Abnormal; Notable for the following components:   Neutro Abs 9.2 (*)    All other components within normal limits  COMPREHENSIVE METABOLIC PANEL - Abnormal; Notable for the following components:   CO2 21 (*)    Glucose, Bld 230 (*)    Creatinine, Ser 1.21 (*)    Calcium 5.4 (*)    Total Protein 5.8 (*)    Albumin 2.7 (*)    GFR, Estimated 52 (*)    All other components within normal limits  TROPONIN I (HIGH SENSITIVITY) - Abnormal; Notable for the following components:   Troponin I (High Sensitivity) 34 (*)    All other components within normal limits  RESP PANEL BY RT-PCR (FLU A&B, COVID) ARPGX2  CULTURE, BLOOD (ROUTINE X 2)   CULTURE, BLOOD (ROUTINE X 2)  PROTIME-INR  APTT  ETHANOL  MAGNESIUM  PHOSPHORUS  PTH, INTACT AND CALCIUM  VITAMIN D 25 HYDROXY (VIT D DEFICIENCY, FRACTURES)  I-STAT CHEM 8, ED  I-STAT BETA HCG BLOOD, ED ([facility], [facility], [facility] ONLY)  CBG MONITORING, ED  TROPONIN I (HIGH SENSITIVITY)    EKG EKG Interpretation  Date/Time:  Monday July 20 2022 16:35:27 EDT Ventricular Rate:  107 PR Interval:  123 QRS Duration: 82 QT Interval:  365 QTC Calculation: 487 R Axis:   6 Text Interpretation: Sinus tachycardia Borderline prolonged QT interval Confirmed by Sherwood Gambler [phone]) on 07/20/2022 4:42:15 PM  Radiology DG Chest 2 View  Result Date: 07/20/2022 CLINICAL DATA:  Cough. EXAM: CHEST - 2 VIEW COMPARISON:  Chest x-ray 06/19/2022 FINDINGS: Lung volumes are low. There are some patchy opacities in the right lower lobe. There is stable scarring in the left mid lung. The heart is mildly enlarged, unchanged. There is no pleural effusion or pneumothorax. No acute fractures are seen. IMPRESSION: 1. Right lower lobe atelectasis/airspace disease. 2. Stable cardiomegaly. Electronically Signed   By: Ronney Asters M.D.   On: 07/20/2022 16:07   CT HEAD WO CONTRAST  Result Date: 07/20/2022 CLINICAL DATA:  Patient complains of awakening with weakness this morning. EXAM: CT HEAD WITHOUT CONTRAST TECHNIQUE: Contiguous axial images were obtained from the base of the skull through the vertex without intravenous contrast. RADIATION DOSE REDUCTION: This exam was performed according to the departmental dose-optimization program which includes automated exposure control, adjustment of the mA and/or kV according to patient size and/or use of iterative reconstruction technique. COMPARISON:  06/15/2022 FINDINGS: Brain: No evidence of acute infarction, hemorrhage, hydrocephalus, extra-axial collection or mass lesion/mass effect. Chronic lacunar infarct within the left basal ganglia and right-side of pons identified. Vascular: No  hyperdense vessel or unexpected calcification. Skull: Normal. Negative for fracture or focal lesion. Sinuses/Orbits: No acute finding. Other: None IMPRESSION: 1. No acute intracranial abnormalities. 2. Chronic lacunar infarcts within the left basal  ganglia and right-side of pons. Electronically Signed   By: Kerby Moors M.D.   On: 07/20/2022 14:43    Procedures .Critical Care  Performed by: Sherwood Gambler, MD Authorized by: Sherwood Gambler, MD   Critical care provider statement:    Critical care time (minutes):  35   Critical care time was exclusive of:  Separately billable procedures and treating other patients   Critical care was necessary to treat or prevent imminent or life-threatening deterioration of the following conditions:  Metabolic crisis   Critical care was time spent personally by me on the following activities:  Development of treatment plan with patient or surrogate, discussions with consultants, evaluation of patient's response to treatment, examination of patient, ordering and review of laboratory studies, ordering and review of radiographic studies, ordering and performing treatments and interventions, pulse oximetry, re-evaluation of patient's condition and review of old charts     Medications Ordered in ED Medications  sodium chloride flush (NS) 0.9 % injection 3 mL (has no administration in time range)  calcium gluconate 1 g/ 50 mL sodium chloride IVPB (has no administration in time range)  cefTRIAXone (ROCEPHIN) 1 g in sodium chloride 0.9 % 100 mL IVPB (has no administration in time range)  doxycycline (VIBRA-TABS) tablet 100 mg (has no administration in time range)    ED Course/ Medical Decision Making/ A&P                           Medical Decision Making Amount and/or Complexity of Data Reviewed External Data Reviewed: notes.    Details: Admitted to [facility] last month.  In and out of the hospital for neuro problems such as stroke Labs: ordered.     Details: Low calcium, with her low albumin is corrected to 6.4.  Leukocytosis of 10.6 with left shift.  Mildly elevated troponin, unclear cause, probably not ACS Radiology: ordered and independent interpretation performed.    Details: Right lower lobe pneumonia.  CT head without head bleed ECG/medicine tests: ordered and independent interpretation performed.    Details: No ischemia, QTc on the upper limits of normal  Risk Prescription drug management. Decision regarding hospitalization.   I think her "numbness" to her face is probably more paresthesias as she has normal sensation on exam.  This presentation seems mostly consistent with hypocalcemia.  She has been indicating some intermittent spasms to her hands though not actively present.  She was given 1 g IV calcium to start.  She is also dealing with a cough and chest x-ray is concerning for pneumonia.  Started on antibiotics.  While her QTc is not prolonged I will avoid azithromycin for now and will give Doxy.  Troponin of 34 is probably not pathologic, will need repeat.  No anginal symptoms at this time.  At this point, she will need admission for monitoring and calcium are Pleatman.  Unclear cause of why she is so hypocalcemic.  Discussed with Dr. Roosevelt Locks for admission.        Final Clinical Impression(s) / ED Diagnoses Final diagnoses:  Hypocalcemia  Pneumonia of right lower lobe due to infectious organism    Rx / DC Orders ED Discharge Orders     None         Sherwood Gambler, MD 07/20/22 1655

## 2022-07-21 ENCOUNTER — Observation Stay (HOSPITAL_COMMUNITY): Payer: Medicare Other

## 2022-07-21 DIAGNOSIS — R5381 Other malaise: Secondary | ICD-10-CM | POA: Diagnosis not present

## 2022-07-21 DIAGNOSIS — H5462 Unqualified visual loss, left eye, normal vision right eye: Secondary | ICD-10-CM | POA: Diagnosis present

## 2022-07-21 DIAGNOSIS — I10 Essential (primary) hypertension: Secondary | ICD-10-CM | POA: Diagnosis not present

## 2022-07-21 DIAGNOSIS — R1310 Dysphagia, unspecified: Secondary | ICD-10-CM | POA: Diagnosis not present

## 2022-07-21 DIAGNOSIS — I130 Hypertensive heart and chronic kidney disease with heart failure and stage 1 through stage 4 chronic kidney disease, or unspecified chronic kidney disease: Secondary | ICD-10-CM | POA: Diagnosis present

## 2022-07-21 DIAGNOSIS — I609 Nontraumatic subarachnoid hemorrhage, unspecified: Secondary | ICD-10-CM | POA: Diagnosis not present

## 2022-07-21 DIAGNOSIS — E669 Obesity, unspecified: Secondary | ICD-10-CM | POA: Diagnosis present

## 2022-07-21 DIAGNOSIS — R296 Repeated falls: Secondary | ICD-10-CM | POA: Diagnosis not present

## 2022-07-21 DIAGNOSIS — R1319 Other dysphagia: Secondary | ICD-10-CM | POA: Diagnosis not present

## 2022-07-21 DIAGNOSIS — I2510 Atherosclerotic heart disease of native coronary artery without angina pectoris: Secondary | ICD-10-CM | POA: Diagnosis not present

## 2022-07-21 DIAGNOSIS — E119 Type 2 diabetes mellitus without complications: Secondary | ICD-10-CM | POA: Diagnosis not present

## 2022-07-21 DIAGNOSIS — M545 Low back pain, unspecified: Secondary | ICD-10-CM | POA: Diagnosis not present

## 2022-07-21 DIAGNOSIS — R609 Edema, unspecified: Secondary | ICD-10-CM | POA: Diagnosis not present

## 2022-07-21 DIAGNOSIS — S72142S Displaced intertrochanteric fracture of left femur, sequela: Secondary | ICD-10-CM | POA: Diagnosis not present

## 2022-07-21 DIAGNOSIS — K2970 Gastritis, unspecified, without bleeding: Secondary | ICD-10-CM | POA: Diagnosis not present

## 2022-07-21 DIAGNOSIS — E7800 Pure hypercholesterolemia, unspecified: Secondary | ICD-10-CM | POA: Diagnosis not present

## 2022-07-21 DIAGNOSIS — Z8673 Personal history of transient ischemic attack (TIA), and cerebral infarction without residual deficits: Secondary | ICD-10-CM | POA: Diagnosis not present

## 2022-07-21 DIAGNOSIS — E1122 Type 2 diabetes mellitus with diabetic chronic kidney disease: Secondary | ICD-10-CM | POA: Diagnosis present

## 2022-07-21 DIAGNOSIS — I5022 Chronic systolic (congestive) heart failure: Secondary | ICD-10-CM | POA: Diagnosis not present

## 2022-07-21 DIAGNOSIS — R933 Abnormal findings on diagnostic imaging of other parts of digestive tract: Secondary | ICD-10-CM | POA: Diagnosis not present

## 2022-07-21 DIAGNOSIS — M549 Dorsalgia, unspecified: Secondary | ICD-10-CM | POA: Diagnosis present

## 2022-07-21 DIAGNOSIS — R279 Unspecified lack of coordination: Secondary | ICD-10-CM | POA: Diagnosis not present

## 2022-07-21 DIAGNOSIS — R2689 Other abnormalities of gait and mobility: Secondary | ICD-10-CM | POA: Diagnosis not present

## 2022-07-21 DIAGNOSIS — F419 Anxiety disorder, unspecified: Secondary | ICD-10-CM | POA: Diagnosis present

## 2022-07-21 DIAGNOSIS — Z7902 Long term (current) use of antithrombotics/antiplatelets: Secondary | ICD-10-CM | POA: Diagnosis not present

## 2022-07-21 DIAGNOSIS — G8929 Other chronic pain: Secondary | ICD-10-CM | POA: Diagnosis present

## 2022-07-21 DIAGNOSIS — I739 Peripheral vascular disease, unspecified: Secondary | ICD-10-CM | POA: Diagnosis not present

## 2022-07-21 DIAGNOSIS — D509 Iron deficiency anemia, unspecified: Secondary | ICD-10-CM | POA: Diagnosis not present

## 2022-07-21 DIAGNOSIS — W19XXXA Unspecified fall, initial encounter: Secondary | ICD-10-CM | POA: Diagnosis not present

## 2022-07-21 DIAGNOSIS — S82143A Displaced bicondylar fracture of unspecified tibia, initial encounter for closed fracture: Secondary | ICD-10-CM | POA: Diagnosis not present

## 2022-07-21 DIAGNOSIS — K219 Gastro-esophageal reflux disease without esophagitis: Secondary | ICD-10-CM | POA: Diagnosis not present

## 2022-07-21 DIAGNOSIS — N1832 Chronic kidney disease, stage 3b: Secondary | ICD-10-CM | POA: Diagnosis not present

## 2022-07-21 DIAGNOSIS — R1311 Dysphagia, oral phase: Secondary | ICD-10-CM | POA: Diagnosis not present

## 2022-07-21 DIAGNOSIS — D649 Anemia, unspecified: Secondary | ICD-10-CM | POA: Diagnosis not present

## 2022-07-21 DIAGNOSIS — I160 Hypertensive urgency: Secondary | ICD-10-CM | POA: Diagnosis present

## 2022-07-21 DIAGNOSIS — I110 Hypertensive heart disease with heart failure: Secondary | ICD-10-CM | POA: Diagnosis not present

## 2022-07-21 DIAGNOSIS — K449 Diaphragmatic hernia without obstruction or gangrene: Secondary | ICD-10-CM | POA: Diagnosis not present

## 2022-07-21 DIAGNOSIS — Z87891 Personal history of nicotine dependence: Secondary | ICD-10-CM | POA: Diagnosis not present

## 2022-07-21 DIAGNOSIS — D631 Anemia in chronic kidney disease: Secondary | ICD-10-CM | POA: Diagnosis present

## 2022-07-21 DIAGNOSIS — E1151 Type 2 diabetes mellitus with diabetic peripheral angiopathy without gangrene: Secondary | ICD-10-CM | POA: Diagnosis present

## 2022-07-21 DIAGNOSIS — I509 Heart failure, unspecified: Secondary | ICD-10-CM | POA: Diagnosis not present

## 2022-07-21 DIAGNOSIS — Z20822 Contact with and (suspected) exposure to covid-19: Secondary | ICD-10-CM | POA: Diagnosis present

## 2022-07-21 DIAGNOSIS — F32A Depression, unspecified: Secondary | ICD-10-CM | POA: Diagnosis present

## 2022-07-21 DIAGNOSIS — H53132 Sudden visual loss, left eye: Secondary | ICD-10-CM | POA: Diagnosis not present

## 2022-07-21 DIAGNOSIS — E1165 Type 2 diabetes mellitus with hyperglycemia: Secondary | ICD-10-CM | POA: Diagnosis not present

## 2022-07-21 DIAGNOSIS — N183 Chronic kidney disease, stage 3 unspecified: Secondary | ICD-10-CM | POA: Diagnosis not present

## 2022-07-21 DIAGNOSIS — J690 Pneumonitis due to inhalation of food and vomit: Secondary | ICD-10-CM | POA: Diagnosis present

## 2022-07-21 DIAGNOSIS — M6281 Muscle weakness (generalized): Secondary | ICD-10-CM | POA: Diagnosis not present

## 2022-07-21 DIAGNOSIS — E8809 Other disorders of plasma-protein metabolism, not elsewhere classified: Secondary | ICD-10-CM | POA: Diagnosis not present

## 2022-07-21 LAB — CBC
HCT: 34 % — ABNORMAL LOW (ref 36.0–46.0)
Hemoglobin: 10.7 g/dL — ABNORMAL LOW (ref 12.0–15.0)
MCH: 28.2 pg (ref 26.0–34.0)
MCHC: 31.5 g/dL (ref 30.0–36.0)
MCV: 89.7 fL (ref 80.0–100.0)
Platelets: 227 10*3/uL (ref 150–400)
RBC: 3.79 MIL/uL — ABNORMAL LOW (ref 3.87–5.11)
RDW: 16.1 % — ABNORMAL HIGH (ref 11.5–15.5)
WBC: 7.3 10*3/uL (ref 4.0–10.5)
nRBC: 0 % (ref 0.0–0.2)

## 2022-07-21 LAB — MAGNESIUM
Magnesium: 1.3 mg/dL — ABNORMAL LOW (ref 1.7–2.4)
Magnesium: 1.5 mg/dL — ABNORMAL LOW (ref 1.7–2.4)

## 2022-07-21 LAB — COMPREHENSIVE METABOLIC PANEL
ALT: 12 U/L (ref 0–44)
ALT: 12 U/L (ref 0–44)
AST: 17 U/L (ref 15–41)
AST: 16 U/L (ref 15–41)
Albumin: 2.2 g/dL — ABNORMAL LOW (ref 3.5–5.0)
Albumin: 2.2 g/dL — ABNORMAL LOW (ref 3.5–5.0)
Alkaline Phosphatase: 58 U/L (ref 38–126)
Alkaline Phosphatase: 72 U/L (ref 38–126)
Anion gap: 9 (ref 5–15)
Anion gap: 6 (ref 5–15)
BUN: 21 mg/dL — ABNORMAL HIGH (ref 6–20)
BUN: 22 mg/dL — ABNORMAL HIGH (ref 6–20)
CO2: 25 mmol/L (ref 22–32)
CO2: 25 mmol/L (ref 22–32)
Calcium: 6.2 mg/dL — CL (ref 8.9–10.3)
Calcium: 7.1 mg/dL — ABNORMAL LOW (ref 8.9–10.3)
Chloride: 102 mmol/L (ref 98–111)
Chloride: 102 mmol/L (ref 98–111)
Creatinine, Ser: 1.46 mg/dL — ABNORMAL HIGH (ref 0.44–1.00)
Creatinine, Ser: 1.54 mg/dL — ABNORMAL HIGH (ref 0.44–1.00)
GFR, Estimated: 41 mL/min — ABNORMAL LOW (ref >60)
GFR, Estimated: 39 mL/min — ABNORMAL LOW (ref >60)
Glucose, Bld: 142 mg/dL — ABNORMAL HIGH (ref 70–99)
Glucose, Bld: 256 mg/dL — ABNORMAL HIGH (ref 70–99)
Potassium: 3.7 mmol/L (ref 3.5–5.1)
Potassium: 3.5 mmol/L (ref 3.5–5.1)
Sodium: 136 mmol/L (ref 135–145)
Sodium: 133 mmol/L — ABNORMAL LOW (ref 135–145)
Total Bilirubin: 0.7 mg/dL (ref 0.3–1.2)
Total Bilirubin: 0.5 mg/dL (ref 0.3–1.2)
Total Protein: 5.1 g/dL — ABNORMAL LOW (ref 6.5–8.1)
Total Protein: 5.2 g/dL — ABNORMAL LOW (ref 6.5–8.1)

## 2022-07-21 LAB — HEPATIC FUNCTION PANEL
ALT: 16 U/L (ref 0–44)
AST: 17 U/L (ref 15–41)
Albumin: 2.6 g/dL — ABNORMAL LOW (ref 3.5–5.0)
Alkaline Phosphatase: 65 U/L (ref 38–126)
Bilirubin, Direct: <0.1 mg/dL (ref 0.0–0.2)
Total Bilirubin: 1.3 mg/dL — ABNORMAL HIGH (ref 0.3–1.2)
Total Protein: 5.9 g/dL — ABNORMAL LOW (ref 6.5–8.1)

## 2022-07-21 LAB — BASIC METABOLIC PANEL
Anion gap: 11 (ref 5–15)
BUN: 19 mg/dL (ref 6–20)
CO2: 24 mmol/L (ref 22–32)
Calcium: 5.6 mg/dL — CL (ref 8.9–10.3)
Chloride: 103 mmol/L (ref 98–111)
Creatinine, Ser: 1.36 mg/dL — ABNORMAL HIGH (ref 0.44–1.00)
GFR, Estimated: 45 mL/min — ABNORMAL LOW (ref >60)
Glucose, Bld: 206 mg/dL — ABNORMAL HIGH (ref 70–99)
Potassium: 4.2 mmol/L (ref 3.5–5.1)
Sodium: 138 mmol/L (ref 135–145)

## 2022-07-21 LAB — GLUCOSE, CAPILLARY
Glucose-Capillary: 260 mg/dL — ABNORMAL HIGH (ref 70–99)
Glucose-Capillary: 96 mg/dL (ref 70–99)
Glucose-Capillary: 294 mg/dL — ABNORMAL HIGH (ref 70–99)
Glucose-Capillary: 211 mg/dL — ABNORMAL HIGH (ref 70–99)

## 2022-07-21 LAB — PTH, INTACT AND CALCIUM
Calcium, Total (PTH): 5.3 mg/dL — CL (ref 8.7–10.2)
PTH: 23 pg/mL (ref 15–65)

## 2022-07-21 MED ORDER — CALCIUM GLUCONATE-NACL 2-0.675 GM/100ML-% IV SOLN
2.0000 g | Freq: Once | INTRAVENOUS | Status: AC
  Administered 2022-07-21: 2000 mg via INTRAVENOUS
  Filled 2022-07-21: qty 100

## 2022-07-21 MED ORDER — SODIUM CHLORIDE 0.45 % IV SOLN: INTRAVENOUS | Status: DC

## 2022-07-21 MED ORDER — INSULIN ASPART 100 UNIT/ML IJ SOLN
2.0000 [IU] | Freq: Three times a day (TID) | INTRAMUSCULAR | Status: DC
  Administered 2022-07-22 – 2022-07-23 (×6): 2 [IU] via SUBCUTANEOUS

## 2022-07-21 MED ORDER — INSULIN DETEMIR 100 UNIT/ML ~~LOC~~ SOLN
10.0000 [IU] | Freq: Every day | SUBCUTANEOUS | Status: DC
  Administered 2022-07-21 – 2022-07-23 (×3): 10 [IU] via SUBCUTANEOUS
  Filled 2022-07-21 (×4): qty 0.1

## 2022-07-21 MED ORDER — MAGNESIUM SULFATE 2 GM/50ML IV SOLN
2.0000 g | Freq: Once | INTRAVENOUS | Status: AC
  Administered 2022-07-21: 2 g via INTRAVENOUS
  Filled 2022-07-21: qty 50

## 2022-07-21 NOTE — Evaluation (Signed)
Physical Therapy Evaluation Patient Details Name: Erin Jackson MRN: [id number] DOB: 02-14-1966 Today's Date: 07/21/2022  History of Present Illness  Pt is a 56 y/o female admitted secondary to weakness and cramping. Found to have hypocalcemia. PMH includes CVA, DM, HTN, and vitamin D deficiency.  Clinical Impression  Pt admitted secondary to problem above with deficits below. Pt requiring min A for bed mobility tasks. Increased pain limited further mobility. Pt reports having difficulty caring for herself at home. Recommending SNF level therapies at d/c to address current mobility deficits. Will continue to follow acutely.        Recommendations for follow up therapy are one component of a multi-disciplinary discharge planning process, led by the attending physician.  Recommendations may be updated based on patient status, additional functional criteria and insurance authorization.  Follow Up Recommendations Skilled nursing-short term rehab (<3 hours/day) Can patient physically be transported by private vehicle: Yes    Assistance Recommended at Discharge Intermittent Supervision/Assistance  Patient can return home with the following  A little help with walking and/or transfers;A little help with bathing/dressing/bathroom;Help with stairs or ramp for entrance;Assist for transportation;Assistance with cooking/housework    Equipment Recommendations None recommended by PT  Recommendations for Other Services       Functional Status Assessment Patient has had a recent decline in their functional status and demonstrates the ability to make significant improvements in function in a reasonable and predictable amount of time.     Precautions / Restrictions Precautions Precautions: Fall Restrictions Weight Bearing Restrictions: No      Mobility  Bed Mobility Overal bed mobility: Needs Assistance Bed Mobility: Supine to Sit, Sit to Supine     Supine to sit: Min assist Sit to supine:  Min assist   General bed mobility comments: Assist for LE and trunk. Increased time required. Pt reporting increased pain and wanting to return to sitting.    Transfers                        Ambulation/Gait                  Stairs            Wheelchair Mobility    Modified Rankin (Stroke Patients Only)       Balance Overall balance assessment: Needs assistance Sitting-balance support: Feet supported Sitting balance-Leahy Scale: Fair                                       Pertinent Vitals/Pain Pain Assessment Pain Assessment: Faces Pain Score: 10-Worst pain ever Pain Location: hands. Pain Descriptors / Indicators: Cramping Pain Intervention(s): Limited activity within patient's tolerance, Monitored during session, Repositioned    Home Living Family/patient expects to be discharged to:: Private residence Living Arrangements: Alone Available Help at Discharge: Family;Available PRN/intermittently Type of Home: Apartment Home Access: Stairs to enter Entrance Stairs-Rails: None Entrance Stairs-Number of Steps: 3   Home Layout: One level Home Equipment: Wheelchair - Forensic psychologist (2 wheels);Tub bench      Prior Function Prior Level of Function : Independent/Modified Independent             Mobility Comments: using RW for mobility, reports having to use the WC the day before admission secondary to weakness ADLs Comments: Is having difficulty performing ADL tasks.     Hand Dominance  Extremity/Trunk Assessment   Upper Extremity Assessment Upper Extremity Assessment: Defer to OT evaluation    Lower Extremity Assessment Lower Extremity Assessment: Generalized weakness    Cervical / Trunk Assessment Cervical / Trunk Assessment: Kyphotic  Communication   Communication: No difficulties  Cognition Arousal/Alertness: Awake/alert Behavior During Therapy: Flat affect Overall Cognitive Status: No  family/caregiver present to determine baseline cognitive functioning                                 General Comments: Very flat throughout, but following all commands.        General Comments General comments (skin integrity, edema, etc.): Pt reporting concerns about going home alone    Exercises     Assessment/Plan    PT Assessment Patient needs continued PT services  PT Problem List Decreased strength;Decreased range of motion;Decreased activity tolerance;Decreased balance;Decreased mobility;Decreased safety awareness       PT Treatment Interventions DME instruction;Gait training;Stair training;Functional mobility training;Therapeutic exercise;Therapeutic activities;Balance training;Neuromuscular re-education;Patient/family education    PT Goals (Current goals can be found in the Care Plan section)  Acute Rehab PT Goals Patient Stated Goal: to get stronger PT Goal Formulation: With patient Time For Goal Achievement: 08/04/22 Potential to Achieve Goals: Good    Frequency Min 2X/week     Co-evaluation               AM-PAC PT "6 Clicks" Mobility  Outcome Measure Help needed turning from your back to your side while in a flat bed without using bedrails?: A Little Help needed moving from lying on your back to sitting on the side of a flat bed without using bedrails?: A Little Help needed moving to and from a bed to a chair (including a wheelchair)?: A Lot Help needed standing up from a chair using your arms (e.g., wheelchair or bedside chair)?: A Lot Help needed to walk in hospital room?: A Lot Help needed climbing 3-5 steps with a railing? : A Lot 6 Click Score: 14    End of Session   Activity Tolerance: Patient limited by pain Patient left: in bed;with call bell/phone within reach;with bed alarm set Nurse Communication: Mobility status PT Visit Diagnosis: Muscle weakness (generalized) (M62.81);Difficulty in walking, not elsewhere classified  (R26.2)    Time: 1751-0258 PT Time Calculation (min) (ACUTE ONLY): 20 min   Charges:   PT Evaluation $PT Eval Moderate Complexity: 1 Mod          Farley Ly, PT, DPT  Acute Rehabilitation Services  Office: [phone]   Lehman Prom 07/21/2022, 2:47 PM

## 2022-07-21 NOTE — Progress Notes (Signed)
Progress Note  Patient: Erin Jackson IEP:[id number] DOB: [birth year]  DOA: 07/20/2022  DOS: 07/21/2022    Brief hospital course: TAHNEE CIFUENTES is a 56 y.o. female with medical history significant of IIDM, HTN, anxiety/depression, chronic vitamin D deficiency, presented with new onset of bilateral arms and fingers numbness and cramping and bilateral calf cramping.   Patient has diagnosed vitamin D deficiency however has not been not compliant with vitamin D supplement.  Yesterday evening, after dinner, patient developed bilateral forearm and hands and all 10 fingers numbness and overnight she developed cramping pain of bilateral forearm and hands.  This morning, she woke up and symptoms of normal hands numbness and cramping persisted, and when she also developed bilateral calf cramping. "Thinking of I might have a stroke".  No chest pain no abdominal pain.   Patient also reported productive cough with whitish phlegm for the last 2 to 3 days, no fever or chills.  At baseline, patient has had episodes of coughing and choking after eating solid food.  No history of aspiration denies any fever or chills.   ED Course: Blood pressure elevated.  Severe hypocalcemia, CA 5.4, corrected Ca 6.0.  CT head negative for acute finding but only chronic lacunar stroke.  Patient received IV calcium.  Assessment and Plan: Severe hypocalcemia, vitamin D deficiency: ?Hypoparathyroidism  - Restarted home oral calcium supplement, give vitamin D 50k units weekly, recheck in 8-12 weeks. Level on 7/31 was 25.  - Continue IV calcium gluconate given symptomatic presentation and severity. Repeated supplement this afternoon with persistent though somewhat improved deficiency.  - PTH pending - Continue serial lab monitoring.  Hypomagnesemia: Refractory to supplementation.  - Repeat supplementation and continue monitoring.   Aspiration pneumonia: Reports dysphagia and odynophagia at times as well as cough. CXR with RLL  infiltrate.  - Continue augmentin planning 5 days Tx  Dysphagia, odynophagia: D/w SLP.  - Esophagram.  - Continue PPI, prn compazine   Left eye vision loss: Stroke ruled out during recent admission. Empirically continued on steroids due to elevated inflammatory marker (ESR 69, CRP undetectable), though temporal artery biopsy from 6/13 revealed no specific histopathologic changes, specifically negative for inflammation, giant cells, granulomas, or fragmentation of elastic lamina.  - Temporal arteritis having been ruled out, will start tapering prednisone 75m > 581mdaily.  - Recommend outpatient ophthalmology follow up   HTN: - Continue clonidine and losartan   IDT2DM: HbA1c 6.8%, with hyperglycemia - Continue SSI, add mealtime novolog - Long-acting insulin recently started due to steroid-induced hyperglycemia. CBGs well controlled here off levemir. May need to start lower dose, though would be cautious to avoid hypoglycemia in setting of steroid taper.  - Pt requests regular diet.  Debility, falls:  - PT/OT consulted given her failing to functionally thrive. May require return to nursing facility.    PVD, CAD:  - Continue plavix and statin  Chronic combined HFrEF: LVEF 40-45%, global hypokinesis, G2DD.  - Appears to be hypovolemic at this time, so will cautiously start IV fluids.  - Continue ARB. Not on coreg or metoprolol for unclear reasons.   Stage IIIb CKD: CrCl worsening since arrival, poor oral intake, BUN also rising..  - Start IVF judiciously and monitor Cr closely, avoiding nephrotoxins.   Iron deficiency anemia: Mild.  - Continue supplementation  Obesity: Estimated body mass index is 38.31 kg/m as calculated from the following:   Height as of this encounter: _0  (1.575 m).   Weight as of this encounter: 95 kg.  Subjective: Remains with abnormal sensation in the face and intermittent spasm in fingers. Feels she is unsafe at home, has fallen. No vision in left eye,  decreased acuity in right eye.  Objective: Vitals:   07/21/22 0441 07/21/22 0749 07/21/22 1144 07/21/22 1513  BP: (!) 141/88 128/83 94/67 100/72  Pulse: 99 (!) 108  100  Resp: _0 Temp: 98.3 F (36.8 C) 98.7 F (37.1 C) 98.3 F (36.8 C) 98.7 F (37.1 C)  TempSrc: Oral Oral Oral Oral  SpO2: 100% 97% 98% 98%  Weight:      Height:       Gen: 56 y.o. female in no distress Pulm: Nonlabored breathing room air. Right base diminished. CV: Regular rate and rhythm. No murmur, rub, or gallop. No JVD, no pitting dependent edema. GI: Abdomen soft, non-tender, non-distended, with normoactive bowel sounds.  Ext: Warm, no deformities Skin: No rashes, lesions or ulcers on visualized skin. Neuro: Alert and mostly oriented. +[place]. Negative Trousseau. Left eye blind, right eye with decreased acuity, PERRL, no APD. No acute focal neurological deficits. Psych: Judgement and insight appear fair. Mood euthymic & affect congruent. Behavior is appropriate.    Data Personally reviewed: CBC: Recent Labs  Lab 07/20/22 1307 07/20/22 2112 07/21/22 0256  WBC 10.6*  --  7.3  NEUTROABS 9.2*  --   --   HGB 11.1* 11.6* 10.7*  HCT 35.7* 34.0* 34.0*  MCV 90.8  --  89.7  PLT 228  --  502   Basic Metabolic Panel: Recent Labs  Lab 07/20/22 1216 07/20/22 1307 07/20/22 2112 07/21/22 0256 07/21/22 1038  NA  --  140 139 138 136  K  --  3.9 4.0 4.2 3.7  CL  --  105 101 103 102  CO2  --  21*  --  24 25  GLUCOSE  --  230* 296* 206* 142*  BUN  --  _1 21*  CREATININE  --  1.21* 1.00 1.36* 1.46*  CALCIUM 5.3* 5.4*  --  5.6* 6.2*  MG <0.5*  --   --  1.3* 1.5*  PHOS 4.2  --   --   --   --    GFR: Estimated Creatinine Clearance: 45.2 mL/min (A) (by C-G formula based on SCr of 1.46 mg/dL (H)). Liver Function Tests: Recent Labs  Lab 07/20/22 1307 07/21/22 0256 07/21/22 1038  AST _2 ALT _3 ALKPHOS 75 65 58  BILITOT 0.9 1.3* 0.7  PROT 5.8* 5.9* 5.1*  ALBUMIN 2.7* 2.6*  2.2*   No results for input(s): "LIPASE", "AMYLASE" in the last 168 hours. No results for input(s): "AMMONIA" in the last 168 hours. Coagulation Profile: Recent Labs  Lab 07/20/22 1307  INR 1.2   Cardiac Enzymes: No results for input(s): "CKTOTAL", "CKMB", "CKMBINDEX", "TROPONINI" in the last 168 hours. BNP (last 3 results) No results for input(s): "PROBNP" in the last 8760 hours. HbA1C: No results for input(s): "HGBA1C" in the last 72 hours. CBG: Recent Labs  Lab 07/20/22 2001 07/21/22 0621 07/21/22 1141  GLUCAP 372* 260* 96   Lipid Profile: No results for input(s): "CHOL", "HDL", "LDLCALC", "TRIG", "CHOLHDL", "LDLDIRECT" in the last 72 hours. Thyroid Function Tests: No results for input(s): "TSH", "T4TOTAL", "FREET4", "T3FREE", "THYROIDAB" in the last 72 hours. Anemia Panel: No results for input(s): "VITAMINB12", "FOLATE", "FERRITIN", "TIBC", "IRON", "RETICCTPCT" in the last 72 hours. Urine analysis:    Component Value Date/Time   COLORURINE STRAW (A) 06/14/2022 2128  APPEARANCEUR CLEAR 06/14/2022 2128   LABSPEC 1.008 06/14/2022 2128   PHURINE 6.0 06/14/2022 2128   GLUCOSEU NEGATIVE 06/14/2022 2128   HGBUR SMALL (A) 06/14/2022 2128   BILIRUBINUR NEGATIVE 06/14/2022 2128   [place] 06/14/2022 2128   PROTEINUR 100 (A) 06/14/2022 2128   NITRITE NEGATIVE 06/14/2022 2128   LEUKOCYTESUR NEGATIVE 06/14/2022 2128   Recent Results (from the past 240 hour(s))  Resp Panel by RT-PCR (Flu A&B, Covid) Anterior Nasal Swab     Status: None   Collection Time: 07/20/22  3:43 PM   Specimen: Anterior Nasal Swab  Result Value Ref Range Status   SARS Coronavirus 2 by RT PCR NEGATIVE NEGATIVE Final    Comment: (NOTE) SARS-CoV-2 target nucleic acids are NOT DETECTED.  The SARS-CoV-2 RNA is generally detectable in upper respiratory specimens during the acute phase of infection. The lowest concentration of SARS-CoV-2 viral copies this assay can detect is 138 copies/mL. A  negative result does not preclude SARS-Cov-2 infection and should not be used as the sole basis for treatment or other patient management decisions. A negative result may occur with  improper specimen collection/handling, submission of specimen other than nasopharyngeal swab, presence of viral mutation(s) within the areas targeted by this assay, and inadequate number of viral copies(<138 copies/mL). A negative result must be combined with clinical observations, patient history, and epidemiological information. The expected result is Negative.  Fact Sheet for Patients:  EntrepreneurPulse.com.au  Fact Sheet for Healthcare Providers:  IncredibleEmployment.be  This test is no t yet approved or cleared by the [place] FDA and  has been authorized for detection and/or diagnosis of SARS-CoV-2 by FDA under an Emergency Use Authorization (EUA). This EUA will remain  in effect (meaning this test can be used) for the duration of the COVID-19 declaration under Section 564(b)(1) of the Act, 21 U.S.C.section 360bbb-3(b)(1), unless the authorization is terminated  or revoked sooner.       Influenza A by PCR NEGATIVE NEGATIVE Final   Influenza B by PCR NEGATIVE NEGATIVE Final    Comment: (NOTE) The Xpert Xpress SARS-CoV-2/FLU/RSV plus assay is intended as an aid in the diagnosis of influenza from Nasopharyngeal swab specimens and should not be used as a sole basis for treatment. Nasal washings and aspirates are unacceptable for Xpert Xpress SARS-CoV-2/FLU/RSV testing.  Fact Sheet for Patients: EntrepreneurPulse.com.au  Fact Sheet for Healthcare Providers: IncredibleEmployment.be  This test is not yet approved or cleared by the [place] FDA and has been authorized for detection and/or diagnosis of SARS-CoV-2 by FDA under an Emergency Use Authorization (EUA). This EUA will remain in effect (meaning this test can  be used) for the duration of the COVID-19 declaration under Section 564(b)(1) of the Act, 21 U.S.C. section 360bbb-3(b)(1), unless the authorization is terminated or revoked.  Performed at [facility], [place] [address], [place], [place] [address]   Culture, blood (routine x 2)     Status: None (Preliminary result)   Collection Time: 07/20/22  6:20 PM   Specimen: BLOOD  Result Value Ref Range Status   Specimen Description BLOOD RIGHT ANTECUBITAL  Final   Special Requests   Final    BOTTLES DRAWN AEROBIC AND ANAEROBIC Blood Culture results may not be optimal due to an inadequate volume of blood received in culture bottles   Culture   Final    NO GROWTH < 24 HOURS Performed at [facility], [place] [address] [place], [place] [address]    Report Status PENDING  Incomplete  Culture, blood (routine x 2)     Status: None (Preliminary result)   Collection Time: 07/20/22  6:45 PM   Specimen: BLOOD  Result Value Ref Range Status   Specimen Description BLOOD BLOOD RIGHT HAND  Final   Special Requests   Final    BOTTLES DRAWN AEROBIC ONLY Blood Culture results may not be optimal due to an inadequate volume of blood received in culture bottles   Culture   Final    NO GROWTH < 24 HOURS Performed at [facility], [place] [address] [place], [place] [address]    Report Status PENDING  Incomplete     DG Chest 2 View  Result Date: 07/20/2022 CLINICAL DATA:  Cough. EXAM: CHEST - 2 VIEW COMPARISON:  Chest x-ray 06/19/2022 FINDINGS: Lung volumes are low. There are some patchy opacities in the right lower lobe. There is stable scarring in the left mid lung. The heart is mildly enlarged, unchanged. There is no pleural effusion or pneumothorax. No acute fractures are seen. IMPRESSION: 1. Right lower lobe atelectasis/airspace disease. 2. Stable cardiomegaly. Electronically Signed   By: Ronney Asters M.D.   On: 07/20/2022 16:07   CT HEAD WO CONTRAST  Result Date: 07/20/2022 CLINICAL DATA:   Patient complains of awakening with weakness this morning. EXAM: CT HEAD WITHOUT CONTRAST TECHNIQUE: Contiguous axial images were obtained from the base of the skull through the vertex without intravenous contrast. RADIATION DOSE REDUCTION: This exam was performed according to the departmental dose-optimization program which includes automated exposure control, adjustment of the mA and/or kV according to patient size and/or use of iterative reconstruction technique. COMPARISON:  06/15/2022 FINDINGS: Brain: No evidence of acute infarction, hemorrhage, hydrocephalus, extra-axial collection or mass lesion/mass effect. Chronic lacunar infarct within the left basal ganglia and right-side of pons identified. Vascular: No hyperdense vessel or unexpected calcification. Skull: Normal. Negative for fracture or focal lesion. Sinuses/Orbits: No acute finding. Other: None IMPRESSION: 1. No acute intracranial abnormalities. 2. Chronic lacunar infarcts within the left basal ganglia and right-side of pons. Electronically Signed   By: Kerby Moors M.D.   On: 07/20/2022 14:43    Family Communication: None at bedside during rounds, will discuss this afternoon  Disposition: Status is: Inpatient Remains inpatient appropriate because: Persistent symptomatic severe electrolyte derangements Planned Discharge Destination: [place], [place] 07/21/2022 5:22 PM Page by [email or web address]

## 2022-07-21 NOTE — Inpatient Diabetes Management (Signed)
Inpatient Diabetes Program Recommendations  AACE/ADA: New Consensus Statement on Inpatient Glycemic Control (2015)  Target Ranges:  Prepandial:   less than 140 mg/dL      Peak postprandial:   less than 180 mg/dL (1-2 hours)      Critically ill patients:  140 - 180 mg/dL   Lab Results  Component Value Date   GLUCAP 96 07/21/2022   HGBA1C 6.8 (H) 04/22/2022    Review of Glycemic Control  Diabetes history: DM2 Outpatient Diabetes medications: Levemir 20 QHS, Humalog 2-12 units BID, metformin 500 mg BID. On Pred 60 QD Current orders for Inpatient glycemic control: Novolog 0-20 units TID, Pred 50 QD  HgbA1C - 7.0% on 06/17/22 (Care Everywhere)  Inpatient Diabetes Program Recommendations:    Consider adding Levemir 10 units QHS Consider Novolog 2 units TID with meals if eating > 50%  Follow glucose trends.   Thank you. Ailene Ards, RD, LDN, CDE Inpatient Diabetes Coordinator [phone]

## 2022-07-21 NOTE — TOC Initial Note (Signed)
Transition of Care [facility]) - Initial/Assessment Note    Patient Details  Name: Erin Jackson MRN: [id number] Date of Birth: July 02, 1966  Transition of Care [facility]) CM/SW Contact:    Geralynn Ochs, LCSW Phone Number: 07/21/2022, 3:42 PM  Clinical Narrative:       CSW met with patient to discuss recommendation for SNF. Patient recently at [facility], but unsure how long she was there. CSW explained that patient may be in copay days, so she may owe something if she returns to SNF. Patient in agreement to SNF, but did not seem to understand what CSW was saying about copay days. CSW checked with [facility], where patient was recently, and patient had been there from 6/19-7/16, so she has used all of her 20 days. However, this is patient's 5th admission this year; she may have already met her out of pocket maximum for the year and will not owe copays. [facility] to look into patient's copay amounts and whether they can offer, and update CSW. CSW to follow.            Expected Discharge Plan: Skilled Nursing Facility Barriers to Discharge: Continued Medical Work up, Ship broker   Patient Goals and CMS Choice Patient states their goals for this hospitalization and ongoing recovery are:: to get better CMS Medicare.gov Compare Post Acute Care list provided to:: Patient Choice offered to / list presented to : Patient  Expected Discharge Plan and Services Expected Discharge Plan: [place] Choice: [place] Living arrangements for the past 2 months: Apartment                                      Prior Living Arrangements/Services Living arrangements for the past 2 months: Apartment Lives with:: Self Patient language and need for interpreter reviewed:: Yes Do you feel safe going back to the place where you live?: Yes      Need for Family Participation in Patient Care: No (Comment) Care giver support system in place?: No  (comment) Current home services: DME Criminal Activity/Legal Involvement Pertinent to Current Situation/Hospitalization: No - Comment as needed  Activities of Daily Living Home Assistive Devices/Equipment: None ADL Screening (condition at time of admission) Patient's cognitive ability adequate to safely complete daily activities?: Yes Is the patient deaf or have difficulty hearing?: No Does the patient have difficulty seeing, even when wearing glasses/contacts?: Yes Does the patient have difficulty concentrating, remembering, or making decisions?: No Patient able to express need for assistance with ADLs?: Yes Does the patient have difficulty dressing or bathing?: No Independently performs ADLs?: Yes (appropriate for developmental age) Does the patient have difficulty walking or climbing stairs?: No Weakness of Legs: None Weakness of Arms/Hands: None  Permission Sought/Granted Permission sought to share information with : Chartered certified accountant granted to share information with : Yes, Verbal Permission Granted     Permission granted to share info w AGENCY: SNF        Emotional Assessment Appearance:: Appears stated age Attitude/Demeanor/Rapport: Engaged Affect (typically observed): Appropriate Orientation: : Oriented to Self, Oriented to Place, Oriented to Situation, Oriented to  Time Alcohol / Substance Use: Not Applicable Psych Involvement: No (comment)  Admission diagnosis:  Hypocalcemia [E83.51] Pneumonia of right lower lobe due to infectious organism [J18.9] Patient Active Problem List   Diagnosis Date Noted   Hypocalcemia 07/20/2022   Vision loss  06/01/2022   Temporal arteritis ([place]) 06/13/7627   Embolic stroke ([place]) 31/51/7616   Stage 3b chronic kidney disease ([place])    Chronic systolic heart failure ([place])    Cerebral embolism with cerebral infarction 04/22/2022   Hypertensive urgency 04/21/2022   Altered mental status    Dizziness    Nausea     CAD (coronary artery disease) 04/10/2022   PVD (peripheral vascular disease) ([place]) 04/10/2022   Ischemic cardiomyopathy 07/37/1062   Acute systolic CHF (congestive heart failure) ([place]) 04/10/2022   Allergic reaction 04/08/2022   Angina at rest [facility]) 04/08/2022   Osteoarthritis of carpometacarpal [facility]) joint of right thumb 06/18/2020   Primary osteoarthritis of first carpometacarpal joint of right hand 11/09/2019   Right carpal tunnel syndrome 11/09/2019   De Quervain's tenosynovitis, right 11/09/2019   Arthritis of carpometacarpal ([facility]) joints of both thumbs 11/04/2017   Chronic neck pain 08/20/2017   Supraclavicular mass 08/20/2017   Primary hypertension 08/20/2017   T2DM (type 2 diabetes mellitus) ([place]) 08/20/2017   GERD (gastroesophageal reflux disease) 08/20/2017   Cigarette nicotine dependence without complication 69/48/5462   PCP:  Pcp, No Pharmacy:   [facility] [place], [place] - [address] [place] [address] Phone: [phone] Fax: [place], [place] [address] [place] [address] Phone: [phone] Fax: [phone]     Social Determinants of Health (SDOH) Interventions    Readmission Risk Interventions     No data to display

## 2022-07-21 NOTE — [place] FL2 (Signed)
[place] MEDICAID FL2 LEVEL OF CARE SCREENING TOOL     IDENTIFICATION  Patient Name: Erin Jackson Birthdate: 1966-08-21 Sex: female Admission Date (Current Location): 07/20/2022  [facility] and [place] Number:  Producer, television/film/video and Address:  [facility], [address] [place], [place] [address]      Provider Number: [id number]  Attending Physician Name and Address:  Tyrone Nine, MD  Relative Name and Phone Number:       Current Level of Care: Hospital Recommended Level of Care: Skilled Nursing Facility Prior Approval Number:    Date Approved/Denied:   PASRR Number: [id number]  Discharge Plan: SNF    Current Diagnoses: Patient Active Problem List   Diagnosis Date Noted   Hypocalcemia 07/20/2022   Vision loss 06/01/2022   Temporal arteritis (HCC) 06/01/2022   Embolic stroke (HCC) 04/29/2022   Stage 3b chronic kidney disease (HCC)    Chronic systolic heart failure (HCC)    Cerebral embolism with cerebral infarction 04/22/2022   Hypertensive urgency 04/21/2022   Altered mental status    Dizziness    Nausea    CAD (coronary artery disease) 04/10/2022   PVD (peripheral vascular disease) (HCC) 04/10/2022   Ischemic cardiomyopathy 04/10/2022   Acute systolic CHF (congestive heart failure) (HCC) 04/10/2022   Allergic reaction 04/08/2022   Angina at rest [facility]) 04/08/2022   Osteoarthritis of carpometacarpal (CMC) joint of right thumb 06/18/2020   Primary osteoarthritis of first carpometacarpal joint of right hand 11/09/2019   Right carpal tunnel syndrome 11/09/2019   De Quervain's tenosynovitis, right 11/09/2019   Arthritis of carpometacarpal (CMC) joints of both thumbs 11/04/2017   Chronic neck pain 08/20/2017   Supraclavicular mass 08/20/2017   Primary hypertension 08/20/2017   T2DM (type 2 diabetes mellitus) (HCC) 08/20/2017   GERD (gastroesophageal reflux disease) 08/20/2017   Cigarette nicotine dependence without complication  08/20/2017    Orientation RESPIRATION BLADDER Height & Weight     Self, Time, Situation, Place  Normal Continent Weight: 209 lb 7 oz (95 kg) Height:  5\' 2"  (157.5 cm)  BEHAVIORAL SYMPTOMS/MOOD NEUROLOGICAL BOWEL NUTRITION STATUS      Continent Diet (regular)  AMBULATORY STATUS COMMUNICATION OF NEEDS Skin   Limited Assist Verbally Normal                       Personal Care Assistance Level of Assistance  Bathing, Feeding, Dressing Bathing Assistance: Limited assistance Feeding assistance: Independent Dressing Assistance: Limited assistance     Functional Limitations Info  Sight Sight Info: Impaired (blind left eye)        SPECIAL CARE FACTORS FREQUENCY  PT (By licensed PT), OT (By licensed OT)     PT Frequency: 5x/wk OT Frequency: 5x/wk            Contractures Contractures Info: Not present    Additional Factors Info  Code Status, Allergies, Psychotropic, Insulin Sliding Scale Code Status Info: Full Allergies Info: Cucumber Extract, Molds & Smuts, Norvasc (Amlodipine), Other, Apple Juice, Aspirin, Ibuprofen, Niacin And Related Psychotropic Info: Xanax 1mg  2x/day Insulin Sliding Scale Info: see DC summary       Current Medications (07/21/2022):  This is the current hospital active medication list Current Facility-Administered Medications  Medication Dose Route Frequency Provider Last Rate Last Admin   0.45 % sodium chloride infusion   Intravenous Continuous , MD 75 mL/hr at 07/21/22 1445 New Bag at 07/21/22 1445   acetaminophen (TYLENOL) tablet 650 mg  650 mg Oral Q4H PRN Mikey College T, MD       ALPRAZolam Prudy Feeler) tablet 1 mg  1 mg Oral BID Mikey College T, MD   1 mg at 07/21/22 0857   amoxicillin-clavulanate (AUGMENTIN) 875-125 MG per tablet 1 tablet  1 tablet Oral Q12H Mikey College T, MD   1 tablet at 07/21/22 0857   atorvastatin (LIPITOR) tablet 80 mg  80 mg Oral Daily Mikey College T, MD   80 mg at 07/21/22 0857   calcium carbonate (OS-CAL - dosed  in mg of elemental calcium) tablet 1,250 mg  1 tablet Oral TID WC Mikey College T, MD   1,250 mg at 07/21/22 1223   calcium gluconate 2 g/ 100 mL sodium chloride IVPB  2 g Intravenous Once Hazeline Junker B, MD 100 mL/hr at 07/21/22 1446 2,000 mg at 07/21/22 1446   cloNIDine (CATAPRES) tablet 0.2 mg  0.2 mg Oral BID Mikey College T, MD   0.2 mg at 07/21/22 0857   clopidogrel (PLAVIX) tablet 75 mg  75 mg Oral Daily Mikey College T, MD   75 mg at 07/21/22 0857   enoxaparin (LOVENOX) injection 40 mg  40 mg Subcutaneous Q24H Mikey College T, MD   40 mg at 07/20/22 2059   ferrous gluconate (FERGON) tablet 324 mg  324 mg Oral Georgia Duff T, MD   324 mg at 07/21/22 1223   HYDROcodone-acetaminophen (NORCO) 10-325 MG per tablet 1 tablet  1 tablet Oral TID PRN Mikey College T, MD   1 tablet at 07/21/22 0611   insulin aspart (novoLOG) injection 0-20 Units  0-20 Units Subcutaneous TID WC Emeline General, MD   11 Units at 07/21/22 0855   losartan (COZAAR) tablet 100 mg  100 mg Oral Daily Mikey College T, MD   100 mg at 07/21/22 0857   magnesium sulfate IVPB 2 g 50 mL  2 g Intravenous Once Tyrone Nine, MD       meclizine (ANTIVERT) tablet 12.5 mg  12.5 mg Oral BID PRN Mikey College T, MD       methocarbamol (ROBAXIN) tablet 500 mg  500 mg Oral Q6H PRN Mikey College T, MD   500 mg at 07/20/22 2100   ondansetron (ZOFRAN) tablet 4 mg  4 mg Oral Q6H Mikey College T, MD   4 mg at 07/21/22 0552   pantoprazole (PROTONIX) EC tablet 40 mg  40 mg Oral Daily Mikey College T, MD   40 mg at 07/21/22 0858   predniSONE (DELTASONE) tablet 50 mg  50 mg Oral Q breakfast Mikey College T, MD       prochlorperazine (COMPAZINE) tablet 5 mg  5 mg Oral Q6H PRN Mikey College T, MD       traZODone (DESYREL) tablet 150 mg  150 mg Oral QHS Mikey College T, MD   150 mg at 07/20/22 2100   [START ON 08/01/2022] Vitamin D (Ergocalciferol) (DRISDOL) 1.25 MG (50000 UNIT) capsule 50,000 Units  50,000 Units Oral Q7 days Emeline General, MD         Discharge  Medications: Please see discharge summary for a list of discharge medications.  Relevant Imaging Results:  Relevant Lab Results:   Additional Information SS#: [id number]  [place], [place]

## 2022-07-21 NOTE — Evaluation (Addendum)
Clinical/Bedside Swallow Evaluation Patient Details  Name: Erin Jackson MRN: [id number] Date of Birth: September 16, 1966  Today's Date: 07/21/2022 Time: SLP Start Time (ACUTE ONLY): 0808 SLP Stop Time (ACUTE ONLY): 0845 SLP Time Calculation (min) (ACUTE ONLY): 37 min  Past Medical History:  Past Medical History:  Diagnosis Date   Anxiety    Chronic back pain    Diabetes mellitus without complication (HCC)    GERD (gastroesophageal reflux disease)    Hypertension    Past Surgical History:  Past Surgical History:  Procedure Laterality Date   ABDOMINAL HYSTERECTOMY     ARTERY BIOPSY Left 06/02/2022   Procedure: BIOPSY TEMPORAL ARTERY;  Surgeon: Maeola Harman, MD;  Location: [facility] OR;  Service: Vascular;  Laterality: Left;   BREAST LUMPECTOMY Left 03/25/2022   Procedure: LEFT BREAST LUMPECTOMY;  Surgeon: Griselda Miner, MD;  Location: [facility];  Service: General;  Laterality: Left;   BREAST REDUCTION SURGERY Bilateral 08/15/2018   Procedure: BILATERAL MAMMARY REDUCTION  (BREAST);  Surgeon: Louisa Second, MD;  Location: [facility];  Service: Plastics;  Laterality: Bilateral;   BREAST REDUCTION SURGERY Left 09/25/2019   Procedure: EXCISION OF FAT NECROSIS OF LEFT BREAST;  Surgeon: Louisa Second, MD;  Location: [facility];  Service: Plastics;  Laterality: Left;   CARPAL TUNNEL RELEASE Right 06/17/2020   right thumb CMC arthroplasty with tendon transfer and right carpal tunnel release.    CERVICAL SPINE SURGERY  2015   has a metal plate   FOOT SURGERY Left 2014   HAND SURGERY     LEFT HEART CATH AND CORONARY ANGIOGRAPHY N/A 04/09/2022   Procedure: LEFT HEART CATH AND CORONARY ANGIOGRAPHY;  Surgeon: Orpah Cobb, MD;  Location: [facility];  Service: Cardiovascular;  Laterality: N/A;   REDUCTION MAMMAPLASTY Bilateral 07/2018   HPI:  56 yo female adm to [facility] with hypocalceia and hypomagenesemia, recent left eye vision loss s/p  temporal artery biopsy in June and discharged on prednisone. Pt also with h/o ACDF C5-C6, left frontal white matter and left hippocampus CVAs, GERD, DM2.  She was on CIR in May 2023.  Swallow eval ordered due to concerns for aspiration- CXR showed right LL ATX vs pna.  MRI brain negative 07/20/2022.    Assessment / Plan / Recommendation  Clinical Impression  Patient presents with functional oropharyngeal swallow ability based on clinical swallow evaluation.  She does have h/o uvulectomy and ACDF C5-C6 but denies dysphagia associated with these procedures.  Pt easily passed 3 Ounce Yale swallow screen and did not demonstrate any indication of dysphagia with solids observed and CN exam was negative.  Esophageal issues suspected as pt reports terrible "heart burn" and admits to sensing food/drink/pills in pharynx (suspect refferant) - but also points to esophagus for area of retention.  SLP provided her with esophageal precautions - recommend consideration for dedicated esophageal evaluation and/or follow up with GI as an OP.   Pt's symptoms include sensing food and liquids sticking in pharynx but also esophagus and expectoration is accompained by frothy secretions.  Of note,  pt denies taking too many pain medications that may decr resp drive. SLP Visit Diagnosis: Dysphagia, unspecified (R13.10)    Aspiration Risk  Moderate aspiration risk    Diet Recommendation Regular;Thin liquid   Liquid Administration via: Cup;Straw Medication Administration: Whole meds with liquid Supervision: Patient able to self feed Compensations: Slow rate;Small sips/bites    Other  Recommendations Recommended Consults: Consider esophageal assessment Oral Care Recommendations:  Oral care BID    Recommendations for follow up therapy are one component of a multi-disciplinary discharge planning process, led by the attending physician.  Recommendations may be updated based on patient status, additional functional criteria and  insurance authorization.  Follow up Recommendations No SLP follow up      Assistance Recommended at Discharge None  Functional Status Assessment Patient has not had a recent decline in their functional status (oropharyngeal swallow function)  Frequency and Duration      N/a      Prognosis   N/a     Swallow Study   General Date of Onset: 07/21/22 HPI: 55 yo female adm to [facility] with hypocalceia and hypomagenesemia, recent left eye vision loss s/p temporal artery biopsy in June and discharged on prednisone. Pt also with h/o ACDF C5-C6, left frontal white matter and left hippocampus CVAs, GERD, DM2.  She was on CIR in May 2023.  Swallow eval ordered due to concerns for aspiration- CXR showed right LL ATX vs pna.  MRI brain negative 07/20/2022. Type of Study: Bedside Swallow Evaluation Diet Prior to this Study: Regular;Thin liquids Temperature Spikes Noted: No Respiratory Status: Room air History of Recent Intubation: No Behavior/Cognition: Alert;Cooperative;Pleasant mood Oral Cavity Assessment: Within Functional Limits Oral Care Completed by SLP: No Oral Cavity - Dentition: Adequate natural dentition Vision: Functional for self-feeding Self-Feeding Abilities: Able to feed self Patient Positioning: Upright in bed Baseline Vocal Quality: Normal Volitional Cough: Strong Volitional Swallow: Able to elicit    Oral/Motor/Sensory Function Overall Oral Motor/Sensory Function: Within functional limits   Ice Chips Ice chips: Not tested   Thin Liquid Thin Liquid: Within functional limits Presentation: Straw    Nectar Thick Nectar Thick Liquid: Not tested   Honey Thick Honey Thick Liquid: Not tested   Puree Puree: Not tested   Solid     Solid: Within functional limits Presentation: Self Orvan July 07/21/2022,9:24 AM  Rolena Infante, MS [facility] SLP Acute Rehab Services Office [phone] Pager [phone]

## 2022-07-21 NOTE — Progress Notes (Signed)
Pt refusing IV access at this time. Attempt started with ultrasound, pt wasn't able to hold still and the needle only broke the skin. The patient told this RN to stop and take it out. Primary RN notified.

## 2022-07-22 ENCOUNTER — Inpatient Hospital Stay (HOSPITAL_COMMUNITY): Payer: Medicare Other

## 2022-07-22 DIAGNOSIS — Y92009 Unspecified place in unspecified non-institutional (private) residence as the place of occurrence of the external cause: Secondary | ICD-10-CM

## 2022-07-22 DIAGNOSIS — Z8673 Personal history of transient ischemic attack (TIA), and cerebral infarction without residual deficits: Secondary | ICD-10-CM

## 2022-07-22 DIAGNOSIS — D509 Iron deficiency anemia, unspecified: Secondary | ICD-10-CM

## 2022-07-22 DIAGNOSIS — E669 Obesity, unspecified: Secondary | ICD-10-CM

## 2022-07-22 DIAGNOSIS — R5381 Other malaise: Secondary | ICD-10-CM

## 2022-07-22 DIAGNOSIS — E8809 Other disorders of plasma-protein metabolism, not elsewhere classified: Secondary | ICD-10-CM

## 2022-07-22 LAB — COMPREHENSIVE METABOLIC PANEL
ALT: 11 U/L (ref 0–44)
AST: 14 U/L — ABNORMAL LOW (ref 15–41)
Albumin: 2.3 g/dL — ABNORMAL LOW (ref 3.5–5.0)
Alkaline Phosphatase: 55 U/L (ref 38–126)
Anion gap: 8 (ref 5–15)
BUN: 19 mg/dL (ref 6–20)
CO2: 25 mmol/L (ref 22–32)
Calcium: 7.3 mg/dL — ABNORMAL LOW (ref 8.9–10.3)
Chloride: 103 mmol/L (ref 98–111)
Creatinine, Ser: 1.34 mg/dL — ABNORMAL HIGH (ref 0.44–1.00)
GFR, Estimated: 46 mL/min — ABNORMAL LOW (ref >60)
Glucose, Bld: 133 mg/dL — ABNORMAL HIGH (ref 70–99)
Potassium: 3.8 mmol/L (ref 3.5–5.1)
Sodium: 136 mmol/L (ref 135–145)
Total Bilirubin: 0.5 mg/dL (ref 0.3–1.2)
Total Protein: 5.5 g/dL — ABNORMAL LOW (ref 6.5–8.1)

## 2022-07-22 LAB — GLUCOSE, CAPILLARY
Glucose-Capillary: 200 mg/dL — ABNORMAL HIGH (ref 70–99)
Glucose-Capillary: 218 mg/dL — ABNORMAL HIGH (ref 70–99)
Glucose-Capillary: 455 mg/dL — ABNORMAL HIGH (ref 70–99)
Glucose-Capillary: 336 mg/dL — ABNORMAL HIGH (ref 70–99)
Glucose-Capillary: 261 mg/dL — ABNORMAL HIGH (ref 70–99)

## 2022-07-22 LAB — CALCIUM, IONIZED: Calcium, Ionized, Serum: <3 mg/dL — ABNORMAL LOW (ref 4.5–5.6)

## 2022-07-22 MED ORDER — HYDRALAZINE HCL 20 MG/ML IJ SOLN
5.0000 mg | Freq: Once | INTRAMUSCULAR | Status: AC
  Administered 2022-07-22: 5 mg via INTRAVENOUS
  Filled 2022-07-22: qty 1

## 2022-07-22 MED ORDER — PREDNISONE 20 MG PO TABS
40.0000 mg | ORAL_TABLET | Freq: Every day | ORAL | Status: DC
  Administered 2022-07-23 – 2022-07-25 (×3): 40 mg via ORAL
  Filled 2022-07-22 (×4): qty 2

## 2022-07-22 NOTE — TOC Progression Note (Signed)
Transition of Care [facility]) - Progression Note    Patient Details  Name: Erin Jackson MRN: [id number] Date of Birth: Nov 26, 1966  Transition of Care [facility]) CM/SW [place], [place] Phone Number: 07/22/2022, 4:09 PM  Clinical Narrative:   CSW met with patient to provide bed offers. Patient to review and determine choice. CSW to follow.    Expected Discharge Plan: [place] Barriers to Discharge: Continued Medical Work up, Ship broker  Expected Discharge Plan and Services Expected Discharge Plan: [place] Choice: [place] arrangements for the past 2 months: Apartment                                       Social Determinants of Health (SDOH) Interventions    Readmission Risk Interventions     No data to display

## 2022-07-22 NOTE — Evaluation (Signed)
Occupational Therapy Evaluation Patient Details Name: Erin Jackson MRN: [id number] DOB: August 20, 1966 Today's Date: 07/22/2022   History of Present Illness Pt is a 56 y/o female admitted secondary to weakness and cramping. Found to have hypocalcemia. PMH includes CVA, DM, HTN, and vitamin D deficiency.   Clinical Impression   PTA, pt lived alone and was independent in ADL. Pt reporting she did not du much cooking or cleaning, and frequently ordered door dash. Currently, pt limited by pain, decreased strength, and fatigue. Pt performing LB ADL with Mod A, and UB ADL with supervision/set-up. Performing sit<>stand transfers with close min guard as well as taking side steps. Recommend discharge to SNF for continued OT services to optiomize safety and independence in ADL. Will continue to follow acutely.       Recommendations for follow up therapy are one component of a multi-disciplinary discharge planning process, led by the attending physician.  Recommendations may be updated based on patient status, additional functional criteria and insurance authorization.   Follow Up Recommendations  Skilled nursing-short term rehab (<3 hours/day)    Assistance Recommended at Discharge Intermittent Supervision/Assistance  Patient can return home with the following A little help with walking and/or transfers;A little help with bathing/dressing/bathroom;Assistance with cooking/housework;Direct supervision/assist for medications management;Direct supervision/assist for financial management;Assist for transportation;Help with stairs or ramp for entrance    Functional Status Assessment  Patient has had a recent decline in their functional status and demonstrates the ability to make significant improvements in function in a reasonable and predictable amount of time.  Equipment Recommendations  Other (comment) (Defer)    Recommendations for Other Services       Precautions / Restrictions  Precautions Precautions: Fall Restrictions Weight Bearing Restrictions: No      Mobility Bed Mobility Overal bed mobility: Needs Assistance Bed Mobility: Supine to Sit, Sit to Supine     Supine to sit: Min assist Sit to supine: Min guard   General bed mobility comments: Assist for trunk to rise. Increased time    Transfers Overall transfer level: Needs assistance Equipment used: Rolling walker (2 wheels) Transfers: Sit to/from Stand Sit to Stand: Min guard           General transfer comment: Increased time for sit<>stand. Min verbal cues for hand placement.      Balance Overall balance assessment: Needs assistance Sitting-balance support: Feet supported Sitting balance-Leahy Scale: Fair Sitting balance - Comments: Weight shifting to bring thigh onto bed to don socks.   Standing balance support: Bilateral upper extremity supported, During functional activity Standing balance-Leahy Scale: Poor Standing balance comment: Reliant on RW                           ADL either performed or assessed with clinical judgement   ADL Overall ADL's : Needs assistance/impaired Eating/Feeding: Set up;Sitting   Grooming: Set up;Sitting;Supervision/safety   Upper Body Bathing: Supervision/ safety;Set up;Sitting   Lower Body Bathing: Sit to/from stand;Moderate assistance   Upper Body Dressing : Sitting;Min guard   Lower Body Dressing: Sit to/from stand;Moderate assistance Lower Body Dressing Details (indicate cue type and reason): Requiring mod A to start socks on BLE Toilet Transfer: Min guard;Rolling walker (2 wheels);Comfort height toilet Toilet Transfer Details (indicate cue type and reason): Simulated sit<>stand         Functional mobility during ADLs: Min guard;Rolling walker (2 wheels) General ADL Comments: Pt taking 1-2 steps forward and 1-2 steps backward; 3 steps toward [facility]  Vision Baseline Vision/History: 1 Wears glasses (reading; reports that  she cannot see well out of her L eye and that this is not new) Additional Comments: Pt reports no new changes in vision     Perception     Praxis      Pertinent Vitals/Pain Pain Assessment Pain Assessment: 0-10 Pain Score: 10-Worst pain ever Pain Location: Left anterior lower leg Pain Descriptors / Indicators: Aching, Constant, Discomfort, Moaning (Pt reporting it feels like someone is hitting her with a hammer) Pain Intervention(s): Limited activity within patient's tolerance, Monitored during session, Repositioned, RN gave pain meds during session, Relaxation     Hand Dominance Right   Extremity/Trunk Assessment Upper Extremity Assessment Upper Extremity Assessment: Generalized weakness;RUE deficits/detail;LUE deficits/detail RUE Deficits / Details: 3+/5 grip strength, 4-/5 push/pull, decreased speed of thumb opposition LUE Deficits / Details: 3+/5 grip strength, 4-/5 push/pull, decreased speed of thumb opposition; limited shoulder ROM from prior CVA   Lower Extremity Assessment Lower Extremity Assessment: Defer to PT evaluation   Cervical / Trunk Assessment Cervical / Trunk Assessment: Kyphotic   Communication Communication Communication: No difficulties   Cognition Arousal/Alertness: Awake/alert Behavior During Therapy: Flat affect Overall Cognitive Status: No family/caregiver present to determine baseline cognitive functioning                                 General Comments: Flat, but following commands with increased time. Pt with decreased processing speed, sustained attention, and ability to follow one step commands. Pt oriented, however, requiring ~2 minutes to recall day of the week. Pt requiring min verbal cues for safety with transfer.     General Comments  Pt reporting she is depressed. Offered to contact social work and Orthoptist, and pt denying.    Exercises     Shoulder Instructions      Home Living Family/patient expects to be discharged  to:: Private residence Living Arrangements: Alone Available Help at Discharge: Family;Available PRN/intermittently Type of Home: Apartment Home Access: Stairs to enter Entrance Stairs-Number of Steps: 3 Entrance Stairs-Rails: None Home Layout: One level     Bathroom Shower/Tub: Chief Strategy Officer: Standard     Home Equipment: Wheelchair - Forensic psychologist (2 wheels);Tub bench          Prior Functioning/Environment Prior Level of Function : Independent/Modified Independent             Mobility Comments: using RW for mobility, reports having to use the WC the day before admission secondary to weakness ADLs Comments: Pt reports she did all of her ADLs, but difficulty with IADL and cleaning. Reporting she does not drive and typically utilizes door dash for food        OT Problem List: Decreased strength;Decreased activity tolerance;Decreased range of motion;Impaired balance (sitting and/or standing);Decreased coordination;Decreased knowledge of use of DME or AE;Decreased knowledge of precautions;Obesity;Decreased cognition;Decreased safety awareness      OT Treatment/Interventions: Self-care/ADL training;DME and/or AE instruction;Therapeutic activities;Visual/perceptual remediation/compensation;Patient/family education;Balance training;Neuromuscular education;Therapeutic exercise    OT Goals(Current goals can be found in the care plan section) Acute Rehab OT Goals Patient Stated Goal: No pain OT Goal Formulation: With patient Time For Goal Achievement: 06/18/22 Potential to Achieve Goals: Good ADL Goals Pt Will Perform Grooming: with modified independence;standing Pt Will Perform Lower Body Dressing: with modified independence;sit to/from stand Pt Will Transfer to Toilet: with modified independence;ambulating;regular height toilet Pt Will Perform Toileting - Clothing Manipulation and hygiene: with modified  independence;sit to/from stand Pt Will Perform  Tub/Shower Transfer: Tub transfer;tub bench;rolling walker;ambulating  OT Frequency: Min 2X/week    Co-evaluation              AM-PAC OT "6 Clicks" Daily Activity     Outcome Measure Help from another person eating meals?: A Little Help from another person taking care of personal grooming?: A Little Help from another person toileting, which includes using toliet, bedpan, or urinal?: A Little Help from another person bathing (including washing, rinsing, drying)?: A Little Help from another person to put on and taking off regular upper body clothing?: A Little Help from another person to put on and taking off regular lower body clothing?: A Lot 6 Click Score: 17   End of Session Equipment Utilized During Treatment: Gait belt;Rolling walker (2 wheels) Nurse Communication: Mobility status;Other (comment) (LLE pain)  Activity Tolerance: Patient tolerated treatment well Patient left: in bed;with call bell/phone within reach;with bed alarm set;with nursing/sitter in room  OT Visit Diagnosis: Other abnormalities of gait and mobility (R26.89);Muscle weakness (generalized) (M62.81);Low vision, both eyes (H54.2)                Time: 1950-9326 OT Time Calculation (min): 24 min Charges:  OT General Charges $OT Visit: 1 Visit OT Evaluation $OT Eval Moderate Complexity: 1 Mod OT Treatments $Self Care/Home Management : 8-22 mins  Ladene Artist, OTR/L [facility] Acute Rehabilitation Office: [phone]   Drue Novel 07/22/2022, 4:16 PM

## 2022-07-22 NOTE — Progress Notes (Signed)
Progress Note  Patient: SHERREY NORTH TDH:[id number] DOB: April 09, 1966  DOA: 07/20/2022  DOS: 07/22/2022    Brief hospital course: KERSTYN CORYELL is a 56 y.o. female with medical history significant of IIDM, HTN, anxiety/depression, chronic vitamin D deficiency, presented with new onset of bilateral arms and fingers numbness and cramping and bilateral calf cramping.   Patient has diagnosed vitamin D deficiency however has not been not compliant with vitamin D supplement.  Yesterday evening, after dinner, patient developed bilateral forearm and hands and all 10 fingers numbness and overnight she developed cramping pain of bilateral forearm and hands.  This morning, she woke up and symptoms of normal hands numbness and cramping persisted, and when she also developed bilateral calf cramping. "Thinking of I might have a stroke".  No chest pain no abdominal pain.   Patient also reported productive cough with whitish phlegm for the last 2 to 3 days, no fever or chills.  At baseline, patient has had episodes of coughing and choking after eating solid food.  No history of aspiration denies any fever or chills.   ED Course: Blood pressure elevated.  Severe hypocalcemia, CA 5.4, corrected Ca 6.0.  CT head negative for acute finding but only chronic lacunar stroke.  Patient received IV calcium.  Assessment and Plan: Severe hypocalcemia, vitamin D deficiency: PTH is wnl indicating non-parathyroid-related etiology. - Restarted home oral calcium supplement, giving vitamin D 50k units weekly, recheck in 8-12 weeks. Level on 7/31 was 25.  - Has received several doses of IV calcium with steady improvement. Level overnight was 7.1 (corrects to 8.5), this AM is 7.3 (corrects to 8.7).  - Continue oral supplementation.   - Continue serial lab monitoring.  Bilateral carpal spasm: CT without stroke, no other focal deficits and this bilateral symptom is waxing/waning, suspected to be due to hypocalcemia.  - Continue  monitoring. Added antispasmodic with improvement.   Hypomagnesemia: Improved with supplementation.   Aspiration pneumonia: Reports dysphagia and odynophagia at times as well as cough. CXR with RLL infiltrate.  - Continue augmentin planning 5 days Tx  Dysphagia, odynophagia: For solids >>> liquids. - Esophagram pending.  - Continue PPI, prn compazine   Left eye vision loss: Stroke ruled out during recent admission. Empirically continued on steroids due to elevated inflammatory marker (ESR 69, CRP undetectable), though temporal artery biopsy from 6/13 revealed no specific histopathologic changes, specifically negative for inflammation, giant cells, granulomas, or fragmentation of elastic lamina.  - Temporal arteritis having been ruled out, will start tapering prednisone 66m > 569m> 4054mWill need a prolonged taper since she's been on this for 7 weeks. - Recommend outpatient ophthalmology follow up   History of CVA:  - Continue plavix, statin.  - Pt believes her symptoms since this event, including left eye blindness, have made her unsafe to be home alone, we are pursuing placement for continued rehab at this time.  HTN: - Continue clonidine and losartan   IDT2DM: HbA1c 6.8%, with hyperglycemia - Continue SSI, mealtime novolog, levemir 10u qHS. - Caution to avoid hypoglycemia in setting of steroid taper.  - Pt requests regular diet which increased CBGs, dietitian consulted also to assist with education.  Debility, falls:  - PT/OT recommending SNF rehabilitation. TOC aware and pursuing this.   PVD, CAD:  - Continue plavix and statin  Chronic combined HFrEF: LVEF 40-45%, global hypokinesis, G2DD.  - Stop IVF as swelling in legs is slightly worse. No new respiratory complaints. Will monitor for need for diuretic. -  Continue ARB. Not on coreg or metoprolol for unclear reasons.   Stage IIIb CKD: CrCl worsened initially, now better with IVF. - Monitor Cr closely, avoiding  nephrotoxins.   Iron deficiency anemia: Mild.  - Continue supplementation  Hypoalbuminemia:  - Dietitian consulted   Obesity: Estimated body mass index is 38.31 kg/m as calculated from the following:   Height as of this encounter: _0  (1.575 m).   Weight as of this encounter: 95 kg.  Subjective: Has waxing/waning painful spasm of fingers of both hands last night, less this morning, is taking normal fluids/food by mouth but continues to have heartburn and occasional difficulty swallowing solids, not liquids. Has general headache and numbness on the left face.   Objective: Vitals:   07/22/22 0029 07/22/22 0443 07/22/22 0713 07/22/22 1253  BP: 139/87 107/62 136/86 (!) 161/92  Pulse: 91 97 100 96  Resp:  _1 Temp: 98.9 F (37.2 C) 97.9 F (36.6 C) 98.5 F (36.9 C) 97.9 F (36.6 C)  TempSrc: Oral Oral Oral Oral  SpO2: 97% 100% 100% 100%  Weight:      Height:      Gen: 55 y.o. female in no distress Pulm: Nonlabored breathing room air. Cleared. CV: Regular rate and rhythm. No murmur, rub, or gallop. No JVD, pitting dependent edema 1+ (worse than previous) GI: Abdomen soft, non-tender, non-distended, with normoactive bowel sounds.  Ext: Warm, no deformities Skin: No rashes, lesions or ulcers on visualized skin. Neuro: Alert and oriented. No acute focal neurological deficits, though diffusely weak. Negative Chvostek/Trousseau.  Psych: Judgement and insight appear fair. Mood euthymic & affect congruent. Behavior is appropriate.   Data Personally reviewed: CBC: Recent Labs  Lab 07/20/22 1307 07/20/22 2112 07/21/22 0256  WBC 10.6*  --  7.3  NEUTROABS 9.2*  --   --   HGB 11.1* 11.6* 10.7*  HCT 35.7* 34.0* 34.0*  MCV 90.8  --  89.7  PLT 228  --  096   Basic Metabolic Panel: Recent Labs  Lab 07/20/22 1216 07/20/22 1307 07/20/22 1307 07/20/22 2112 07/21/22 0256 07/21/22 1038 07/21/22 1801 07/22/22 0708  NA  --  140   < > 139 138 136 133* 136  K  --  3.9   < >  4.0 4.2 3.7 3.5 3.8  CL  --  105   < > 101 103 102 102 103  CO2  --  21*  --   --  _2 GLUCOSE  --  230*   < > 296* 206* 142* 256* 133*  BUN  --  17   < > 19 19 21* 22* 19  CREATININE  --  1.21*   < > 1.00 1.36* 1.46* 1.54* 1.34*  CALCIUM 5.3* 5.4*  --   --  5.6* 6.2* 7.1* 7.3*  MG <0.5*  --   --   --  1.3* 1.5*  --   --   PHOS 4.2  --   --   --   --   --   --   --    < > = values in this interval not displayed.   GFR: Estimated Creatinine Clearance: 49.2 mL/min (A) (by C-G formula based on SCr of 1.34 mg/dL (H)). Liver Function Tests: Recent Labs  Lab 07/20/22 1307 07/21/22 0256 07/21/22 1038 07/21/22 1801 07/22/22 0708  AST _3 14*  ALT _4 ALKPHOS 75 65 58 72 55  BILITOT 0.9  1.3* 0.7 0.5 0.5  PROT 5.8* 5.9* 5.1* 5.2* 5.5*  ALBUMIN 2.7* 2.6* 2.2* 2.2* 2.3*    Recent Results (from the past 240 hour(s))  Resp Panel by RT-PCR (Flu A&B, Covid) Anterior Nasal Swab     Status: None   Collection Time: 07/20/22  3:43 PM   Specimen: Anterior Nasal Swab  Result Value Ref Range Status   SARS Coronavirus 2 by RT PCR NEGATIVE NEGATIVE Final    Comment: (NOTE) SARS-CoV-2 target nucleic acids are NOT DETECTED.  The SARS-CoV-2 RNA is generally detectable in upper respiratory specimens during the acute phase of infection. The lowest concentration of SARS-CoV-2 viral copies this assay can detect is 138 copies/mL. A negative result does not preclude SARS-Cov-2 infection and should not be used as the sole basis for treatment or other patient management decisions. A negative result may occur with  improper specimen collection/handling, submission of specimen other than nasopharyngeal swab, presence of viral mutation(s) within the areas targeted by this assay, and inadequate number of viral copies(<138 copies/mL). A negative result must be combined with clinical observations, patient history, and epidemiological information. The expected result is  Negative.  Fact Sheet for Patients:  EntrepreneurPulse.com.au  Fact Sheet for Healthcare Providers:  IncredibleEmployment.be  This test is no t yet approved or cleared by the [place] FDA and  has been authorized for detection and/or diagnosis of SARS-CoV-2 by FDA under an Emergency Use Authorization (EUA). This EUA will remain  in effect (meaning this test can be used) for the duration of the COVID-19 declaration under Section 564(b)(1) of the Act, 21 U.S.C.section 360bbb-3(b)(1), unless the authorization is terminated  or revoked sooner.       Influenza A by PCR NEGATIVE NEGATIVE Final   Influenza B by PCR NEGATIVE NEGATIVE Final    Comment: (NOTE) The Xpert Xpress SARS-CoV-2/FLU/RSV plus assay is intended as an aid in the diagnosis of influenza from Nasopharyngeal swab specimens and should not be used as a sole basis for treatment. Nasal washings and aspirates are unacceptable for Xpert Xpress SARS-CoV-2/FLU/RSV testing.  Fact Sheet for Patients: EntrepreneurPulse.com.au  Fact Sheet for Healthcare Providers: IncredibleEmployment.be  This test is not yet approved or cleared by the [place] FDA and has been authorized for detection and/or diagnosis of SARS-CoV-2 by FDA under an Emergency Use Authorization (EUA). This EUA will remain in effect (meaning this test can be used) for the duration of the COVID-19 declaration under Section 564(b)(1) of the Act, 21 U.S.C. section 360bbb-3(b)(1), unless the authorization is terminated or revoked.  Performed at [facility], [place] [address], [place], [place] [address]   Culture, blood (routine x 2)     Status: None (Preliminary result)   Collection Time: 07/20/22  6:20 PM   Specimen: BLOOD  Result Value Ref Range Status   Specimen Description BLOOD RIGHT ANTECUBITAL  Final   Special Requests   Final    BOTTLES DRAWN AEROBIC AND ANAEROBIC Blood  Culture results may not be optimal due to an inadequate volume of blood received in culture bottles   Culture   Final    NO GROWTH 2 DAYS Performed at [facility], [place] [address] [place], [place] [address]    Report Status PENDING  Incomplete  Culture, blood (routine x 2)     Status: None (Preliminary result)   Collection Time: 07/20/22  6:45 PM   Specimen: BLOOD  Result Value Ref Range Status   Specimen Description BLOOD BLOOD RIGHT HAND  Final   Special Requests   Final    BOTTLES DRAWN AEROBIC ONLY Blood Culture results may not be optimal due to an inadequate volume of blood received in culture bottles   Culture   Final    NO GROWTH 2 DAYS Performed at [facility], [place] [address], [place], [place] [address]    Report Status PENDING  Incomplete     DG ESOPHAGUS W SINGLE CM (SOL OR THIN BA)  Result Date: 07/22/2022 CLINICAL DATA:  56 year old female with history of GERD who presents with dysphagia as well as concern for aspiration pneumonia. Patient has no UGI related past surgical history. Speech evaluation yesterday did not reveal any site of aspiration. Request for single-contrast esophagram for further evaluation. EXAM: ESOPHAGUS/BARIUM SWALLOW/TABLET STUDY TECHNIQUE: Single contrast examination was performed using thin liquid barium. This exam was performed by Durenda Guthrie, PA-C, and was supervised and interpreted by Macy Mis, MD. Exam was limited due to patient's immobility. FLUOROSCOPY: Radiation Exposure Index (as provided by the fluoroscopic device): 17.6 mGy Kerma COMPARISON:  None Available. FINDINGS: Swallowing: Appears normal. No vestibular penetration or aspiration seen on this limited exam. Pharynx: Unremarkable. Esophagus: No mass. Question of mild relative narrowing at the gastroesophageal junction. Esophageal motility: Within normal limits. Hiatal Hernia: None. Gastroesophageal reflux: None visualized. Ingested 64m barium tablet: Held up at the  gastroesophageal junction. Did not pass during the course of study. Other: None. IMPRESSION: Question of mild relative narrowing at the gastroesophageal junction without obstruction to passage of contrast. The barium tablet was held up at the junction and did not pass during course of study. Electronically Signed   By: PMacy MisM.D.   On: 07/22/2022 12:36   DG Chest 2 View  Result Date: 07/20/2022 CLINICAL DATA:  Cough. EXAM: CHEST - 2 VIEW COMPARISON:  Chest x-ray 06/19/2022 FINDINGS: Lung volumes are low. There are some patchy opacities in the right lower lobe. There is stable scarring in the left mid lung. The heart is mildly enlarged, unchanged. There is no pleural effusion or pneumothorax. No acute fractures are seen. IMPRESSION: 1. Right lower lobe atelectasis/airspace disease. 2. Stable cardiomegaly. Electronically Signed   By: ARonney AstersM.D.   On: 07/20/2022 16:07   CT HEAD WO CONTRAST  Result Date: 07/20/2022 CLINICAL DATA:  Patient complains of awakening with weakness this morning. EXAM: CT HEAD WITHOUT CONTRAST TECHNIQUE: Contiguous axial images were obtained from the base of the skull through the vertex without intravenous contrast. RADIATION DOSE REDUCTION: This exam was performed according to the departmental dose-optimization program which includes automated exposure control, adjustment of the mA and/or kV according to patient size and/or use of iterative reconstruction technique. COMPARISON:  06/15/2022 FINDINGS: Brain: No evidence of acute infarction, hemorrhage, hydrocephalus, extra-axial collection or mass lesion/mass effect. Chronic lacunar infarct within the left basal ganglia and right-side of pons identified. Vascular: No hyperdense vessel or unexpected calcification. Skull: Normal. Negative for fracture or focal lesion. Sinuses/Orbits: No acute finding. Other: None IMPRESSION: 1. No acute intracranial abnormalities. 2. Chronic lacunar infarcts within the left basal ganglia  and right-side of pons. Electronically Signed   By: TKerby MoorsM.D.   On: 07/20/2022 14:43    Family Communication: None at bedside during rounds, will discuss this afternoon  Disposition: Status is: Inpatient Remains inpatient appropriate because: Persistent symptomatic severe electrolyte derangements and need to arrange SNF placement [place] Dest[place]: [place] MD 07/22/2022 1:52 PM Page by aShea [email or web address]

## 2022-07-22 NOTE — Progress Notes (Signed)
1652 CBG 455, MD notified, orders to administer 22 units to patient and change diet orders to carb modified. Patient educated about hyperglycemia, and managing blood sugar with diet and medications. Patient stated " adults should be able to eat what they want" and asked " can I still order [employer] at least" MD notified about patient being upset with diet change.

## 2022-07-22 NOTE — Plan of Care (Signed)
  Problem: Education: Goal: Individualized Educational Video(s) Outcome: Progressing   Problem: Coping: Goal: Ability to adjust to condition or change in health will improve Outcome: Progressing   Problem: Fluid Volume: Goal: Ability to maintain a balanced intake and output will improve Outcome: Progressing   Problem: Health Behavior/Discharge Planning: Goal: Ability to identify and utilize available resources and services will improve Outcome: Progressing Goal: Ability to manage health-related needs will improve Outcome: Progressing   Problem: Metabolic: Goal: Ability to maintain appropriate glucose levels will improve Outcome: Progressing   Problem: Nutritional: Goal: Maintenance of adequate nutrition will improve Outcome: Progressing Goal: Progress toward achieving an optimal weight will improve Outcome: Progressing   Problem: Skin Integrity: Goal: Risk for impaired skin integrity will decrease Outcome: Progressing   Problem: Tissue Perfusion: Goal: Adequacy of tissue perfusion will improve Outcome: Progressing   Problem: Education: Goal: Knowledge of General Education information will improve Description: Including pain rating scale, medication(s)/side effects and non-pharmacologic comfort measures Outcome: Progressing   Problem: Health Behavior/Discharge Planning: Goal: Ability to manage health-related needs will improve Outcome: Progressing   Problem: Clinical Measurements: Goal: Ability to maintain clinical measurements within normal limits will improve Outcome: Progressing Goal: Will remain free from infection Outcome: Progressing Goal: Diagnostic test results will improve Outcome: Progressing Goal: Respiratory complications will improve Outcome: Progressing Goal: Cardiovascular complication will be avoided Outcome: Progressing   Problem: Activity: Goal: Risk for activity intolerance will decrease Outcome: Progressing   Problem: Nutrition: Goal:  Adequate nutrition will be maintained Outcome: Progressing   Problem: Coping: Goal: Level of anxiety will decrease Outcome: Progressing   Problem: Elimination: Goal: Will not experience complications related to bowel motility Outcome: Progressing Goal: Will not experience complications related to urinary retention Outcome: Progressing   Problem: Pain Managment: Goal: General experience of comfort will improve Outcome: Progressing   Problem: Safety: Goal: Ability to remain free from injury will improve Outcome: Progressing   Problem: Skin Integrity: Goal: Risk for impaired skin integrity will decrease Outcome: Progressing   

## 2022-07-23 ENCOUNTER — Inpatient Hospital Stay (HOSPITAL_COMMUNITY): Payer: Medicare Other

## 2022-07-23 DIAGNOSIS — I2583 Coronary atherosclerosis due to lipid rich plaque: Secondary | ICD-10-CM

## 2022-07-23 DIAGNOSIS — D649 Anemia, unspecified: Secondary | ICD-10-CM | POA: Diagnosis not present

## 2022-07-23 DIAGNOSIS — R1319 Other dysphagia: Secondary | ICD-10-CM | POA: Diagnosis not present

## 2022-07-23 DIAGNOSIS — I10 Essential (primary) hypertension: Secondary | ICD-10-CM

## 2022-07-23 DIAGNOSIS — Y92009 Unspecified place in unspecified non-institutional (private) residence as the place of occurrence of the external cause: Secondary | ICD-10-CM

## 2022-07-23 DIAGNOSIS — R933 Abnormal findings on diagnostic imaging of other parts of digestive tract: Secondary | ICD-10-CM | POA: Diagnosis not present

## 2022-07-23 DIAGNOSIS — E669 Obesity, unspecified: Secondary | ICD-10-CM

## 2022-07-23 DIAGNOSIS — I5022 Chronic systolic (congestive) heart failure: Secondary | ICD-10-CM

## 2022-07-23 DIAGNOSIS — R5381 Other malaise: Secondary | ICD-10-CM

## 2022-07-23 DIAGNOSIS — Z8673 Personal history of transient ischemic attack (TIA), and cerebral infarction without residual deficits: Secondary | ICD-10-CM

## 2022-07-23 DIAGNOSIS — R609 Edema, unspecified: Secondary | ICD-10-CM | POA: Diagnosis not present

## 2022-07-23 DIAGNOSIS — E1169 Type 2 diabetes mellitus with other specified complication: Secondary | ICD-10-CM

## 2022-07-23 DIAGNOSIS — I2510 Atherosclerotic heart disease of native coronary artery without angina pectoris: Secondary | ICD-10-CM

## 2022-07-23 DIAGNOSIS — D509 Iron deficiency anemia, unspecified: Secondary | ICD-10-CM

## 2022-07-23 DIAGNOSIS — N1832 Chronic kidney disease, stage 3b: Secondary | ICD-10-CM

## 2022-07-23 DIAGNOSIS — W19XXXA Unspecified fall, initial encounter: Secondary | ICD-10-CM

## 2022-07-23 DIAGNOSIS — I739 Peripheral vascular disease, unspecified: Secondary | ICD-10-CM

## 2022-07-23 DIAGNOSIS — K219 Gastro-esophageal reflux disease without esophagitis: Secondary | ICD-10-CM

## 2022-07-23 DIAGNOSIS — H547 Unspecified visual loss: Secondary | ICD-10-CM

## 2022-07-23 DIAGNOSIS — E8809 Other disorders of plasma-protein metabolism, not elsewhere classified: Secondary | ICD-10-CM

## 2022-07-23 LAB — GLUCOSE, CAPILLARY
Glucose-Capillary: 164 mg/dL — ABNORMAL HIGH (ref 70–99)
Glucose-Capillary: 191 mg/dL — ABNORMAL HIGH (ref 70–99)
Glucose-Capillary: 419 mg/dL — ABNORMAL HIGH (ref 70–99)
Glucose-Capillary: 308 mg/dL — ABNORMAL HIGH (ref 70–99)

## 2022-07-23 LAB — COMPREHENSIVE METABOLIC PANEL
ALT: 19 U/L (ref 0–44)
AST: 17 U/L (ref 15–41)
Albumin: 2.1 g/dL — ABNORMAL LOW (ref 3.5–5.0)
Alkaline Phosphatase: 65 U/L (ref 38–126)
Anion gap: 7 (ref 5–15)
BUN: 19 mg/dL (ref 6–20)
CO2: 25 mmol/L (ref 22–32)
Calcium: 8 mg/dL — ABNORMAL LOW (ref 8.9–10.3)
Chloride: 104 mmol/L (ref 98–111)
Creatinine, Ser: 1.09 mg/dL — ABNORMAL HIGH (ref 0.44–1.00)
GFR, Estimated: 59 mL/min — ABNORMAL LOW (ref >60)
Glucose, Bld: 175 mg/dL — ABNORMAL HIGH (ref 70–99)
Potassium: 4.7 mmol/L (ref 3.5–5.1)
Sodium: 136 mmol/L (ref 135–145)
Total Bilirubin: 0.5 mg/dL (ref 0.3–1.2)
Total Protein: 5.4 g/dL — ABNORMAL LOW (ref 6.5–8.1)

## 2022-07-23 MED ORDER — PANTOPRAZOLE SODIUM 40 MG PO TBEC
40.0000 mg | DELAYED_RELEASE_TABLET | Freq: Two times a day (BID) | ORAL | Status: DC
  Administered 2022-07-23 – 2022-07-27 (×8): 40 mg via ORAL
  Filled 2022-07-23 (×8): qty 1

## 2022-07-23 MED ORDER — SUCRALFATE 1 G PO TABS
1.0000 g | ORAL_TABLET | Freq: Three times a day (TID) | ORAL | Status: DC
  Administered 2022-07-23 – 2022-07-27 (×10): 1 g via ORAL
  Filled 2022-07-23 (×10): qty 1

## 2022-07-23 MED ORDER — HYDRALAZINE HCL 50 MG PO TABS
50.0000 mg | ORAL_TABLET | Freq: Four times a day (QID) | ORAL | Status: DC | PRN
  Administered 2022-07-23 – 2022-07-24 (×3): 50 mg via ORAL
  Filled 2022-07-23 (×3): qty 1

## 2022-07-23 MED ORDER — LABETALOL HCL 5 MG/ML IV SOLN
5.0000 mg | INTRAVENOUS | Status: DC | PRN
  Administered 2022-07-23 – 2022-07-24 (×2): 5 mg via INTRAVENOUS
  Filled 2022-07-23 (×3): qty 4

## 2022-07-23 NOTE — Consult Note (Signed)
Consultation  Referring Provider:   [facility] Primary Care Physician:  [facility] Primary Gastroenterologist:  Althia Forts       Reason for Consultation:     Dysphagia   Impression    56 year old female with history of coronary disease, diabetes, hypertension, possible history of aspiration pneumonia with coughing during eating and drinking had barium swallow that showed possible narrowing at GE junction.   Patient does have a history of reflux and most recently has been on prednisone since the end of June for possible temporal arteritis, biopsy was negative she is currently being tapered from this. Smoking history, longstanding history of reflux, via cervical neck surgery No overt GI bleeding  Aspiration pneumonia On Augmentin, first day 07/20/2022 Had speech pathology suggested but do not see where she has been seen by them.  GERD Has had for years, worse with prednisone use since June Has been on Prilosec 40 mg twice daily outpatient with continuing symptoms  Chronic systolic heart failure 81/27/5170 echocardiogram showed improved ejection fraction 0174% grade 2 diastolic dysfunction negative bubble study  CAD/PVD/history of CVA 04/09/2022 multivessel disease mild to moderate, no obstructive coronary disease, continue medical management No chest pain at this time. Last dose of Plavix 0 8/3 this AM  Normocytic anemia, no overt GI bleeding CBC on 07/21/2022   WBC 7.3 HGB 10.7 MCV 89.7 Platelets 227 Has not had an iron, B12 normal, does have stage IIIb CKD We will check iron, ferritin  Stage IIIb chronic kidney disease  Type 2 diabetes  Hypokalemia     Plan   We will check iron ferritin for possible iron deficiency, would suggest patient have colonoscopy with EGD if positive. Patient at some point does need a screening colonoscopy.  Patient with history of aspiration pneumonia, by history still sounds like more oropharynx dysphagia, will  plan for modified barium swallow/speech pathology consult.  Barium swallow showed mild relative narrowing at GE junction without obstruction and contrast but barium tablet was held up, has had longstanding GERD, worsened recently with prednisone use, dysphagia solid food does appear to be more intermittent, possibly from reflux. We will benefit from endoscopy however with most recent Plavix dose being this morning, in order for possible dilatation would need 5-day washout.  Could potentially do a look while on Plavix but would not be able to be therapeutic. Needs EGD at some point to evaluate for esophagitis, Barrett's esophagus, gastritis, H. pylori, carcinoma, timing to be discussed with patient by Dr. Rush Landmark.  I discussed risks of EGD with patient today, including risk of sedation, bleeding or perforation.  Patient provides understanding and gave verbal consent to proceed. Dr. Rush Landmark will see patient the patient and give further recommendations.  Thank you for your kind consultation, we will continue to follow.         HPI:   Erin Jackson is a 56 y.o. female with past medical history significant for type 2 diabetes, hypertension, reflux, 04/09/2022 coronary artery disease multivessel nonobstructive started on Plavix, medical management,, EF 35 to 40% started on Entresto and Farxiga, peripheral arterial disease with left femoral artery occlusion no plan for intervention continue medical management, previous cervical spine surgery 2015, presented for extremity numbness and cramping.  Found to have severe hypocalcemia of 5.4, CT head negative for acute findings but only chronic lacunar stroke. Patient has new cough, right lower lobe infiltrate compatible history with aspiration pneumonia had speech evaluation ordered 07/31 I do not see report, on  Augmentin. 07/22/2022 barium swallow showed mild relative narrowing at the GE junction without obstruction to passage of contrast.  Barium tablet  was held up at the junction did not pass  during course of study. Patient lying in bed, no family at bedside. States she has had longstanding history of reflux for years, has never seen GI doctor no previous endoscopic evaluation or screening colonoscopy. States she is on prescription Prilosec 40 mg twice daily that she takes before food for her reflux, continues to have nocturnal symptoms with waking up and coughing at night. Has burning chest pain, states has been worse since being on prednisone taper in June. Patient is intermittent dysphagia that comes and goes, occasionally drinking water will help or she will have to burp with little bit going into her mouth and then she can swallow. Denies globulus sensation or odynophagia. Also describes fluids "going down the wrong pipe" with coughing with drinking or food.  Patient's had cough recently white phlegm, some increasing shortness of breath but no chest pain. Patient normally has bowel movement every day the last couple weeks she has been having diarrhea up to 4 times a day denies melena or hematochezia.  Since being here she states she has not had a bowel movement Patient's adopted, unknown family history. Patient denies NSAIDs, alcohol, has history of smoking quit 4 months ago, a pack would last 3 to 4 days. Denies any weight loss Patient had admission in June with left vision loss, stroke ruled out, underwent temporal artery biopsy which was negative, and has been on prednisone 60 mg daily since discharge, tapered down to 50 mg and continuing taper to get off.   Abnormal ED labs:   Past Medical History:  Diagnosis Date   Anxiety    Chronic back pain    Diabetes mellitus without complication (HCC)    GERD (gastroesophageal reflux disease)    Hypertension     Surgical History:  She  has a past surgical history that includes Cervical spine surgery (2015); Abdominal hysterectomy; Foot surgery (Left, 2014); Hand surgery; Breast  reduction surgery (Bilateral, 08/15/2018); Reduction mammaplasty (Bilateral, 07/2018); Breast reduction surgery (Left, 09/25/2019); Carpal tunnel release (Right, 06/17/2020); Breast lumpectomy (Left, 03/25/2022); LEFT HEART CATH AND CORONARY ANGIOGRAPHY (N/A, 04/09/2022); and Artery Biopsy (Left, 06/02/2022). Family History:  Her family history includes Breast cancer in her maternal aunt and maternal grandmother. Social History:   reports that she quit smoking about 3 months ago. Her smoking use included cigarettes. She smoked an average of .5 packs per day. She has never used smokeless tobacco. She reports current alcohol use. She reports that she does not currently use drugs after having used the following drugs: Marijuana.  Prior to Admission medications   Medication Sig Start Date End Date Taking? Authorizing Provider  acetaminophen (TYLENOL) 325 MG tablet Take 2 tablets (650 mg total) by mouth every 4 (four) hours as needed for fever or mild pain. 05/07/22  Yes Setzer, Edman Circle, PA-C  alprazolam Duanne Moron) 2 MG tablet Take 0.5 tablets (1 mg total) by mouth 2 (two) times daily. Patient taking differently: Take 2 mg by mouth 2 (two) times daily. 07/08/22  Yes Raulkar, Clide Deutscher, MD  atorvastatin (LIPITOR) 80 MG tablet Take 1 tablet (80 mg total) by mouth daily. Patient taking differently: Take 80 mg by mouth at bedtime. 05/07/22  Yes Setzer, Edman Circle, PA-C  cetirizine (ZYRTEC ALLERGY) 10 MG tablet Take 1 tablet (10 mg total) by mouth daily. Patient taking differently: Take 10 mg  by mouth daily as needed for allergies. 04/14/22  Yes Valarie Merino, MD  cloNIDine (CATAPRES) 0.1 MG tablet Take 1 tablet (0.1 mg total) by mouth 2 (two) times daily. Patient taking differently: Take 0.2 mg by mouth 2 (two) times daily. 05/07/22  Yes Setzer, Edman Circle, PA-C  clopidogrel (PLAVIX) 75 MG tablet Take 1 tablet (75 mg total) by mouth daily. 05/07/22  Yes Setzer, Edman Circle, PA-C  EPINEPHRINE 0.3 mg/0.3 mL IJ SOAJ injection  Inject 0.3 mLs (0.3 mg total) into the muscle once as needed (allergic reaction). Patient taking differently: Inject 0.3 mg into the muscle as needed for anaphylaxis (allergic reaction). 10/04/18  Yes Fabian November, MD  ferrous gluconate (FERGON) 324 MG tablet Take 324 mg by mouth every other day.   Yes [provider]  insulin lispro (HUMALOG) 100 UNIT/ML injection Inject 2-12 Units into the skin 2 (two) times daily. Per sliding scale, if 70 to 200= 0 units, 201 to 250= 2 units, 251 to 300= 4 units 301 to 350= 6 units, 351 to 400= 8 units, 401 to 450= 10 units, 451 to 600= 12 units   Yes [provider]  LEVEMIR FLEXPEN 100 UNIT/ML FlexPen Inject 20 Units into the skin at bedtime. 07/09/22  Yes [provider]  losartan (COZAAR) 100 MG tablet Take 1 tablet (100 mg total) by mouth daily. Patient taking differently: Take 25 mg by mouth daily. 06/09/22  Yes Wouk, Ailene Rud, MD  metFORMIN (GLUCOPHAGE) 500 MG tablet Take 1 tablet (500 mg total) by mouth 2 (two) times daily with a meal. 05/08/22  Yes Setzer, Edman Circle, PA-C  NARCAN 4 MG/0.1ML LIQD nasal spray kit Place 0.4 mg into the nose as needed (opoid overdose). 01/08/20  Yes [provider]  nitroGLYCERIN (NITROSTAT) 0.4 MG SL tablet Place 1 tablet (0.4 mg total) under the tongue every 5 (five) minutes as needed for chest pain. 04/10/22  Yes Elgergawy, Silver Huguenin, MD  omeprazole (PRILOSEC) 40 MG capsule Take 40 mg by mouth daily. 05/09/22  Yes [provider]  ondansetron (ZOFRAN) 4 MG tablet Take 1 tablet (4 mg total) by mouth every 6 (six) hours. 06/15/22  Yes Valarie Merino, MD  predniSONE (DELTASONE) 20 MG tablet Take 60 mg by mouth daily. 06/24/22  Yes [provider]  traZODone (DESYREL) 150 MG tablet Take 1 tablet (150 mg total) by mouth at bedtime. 05/07/22  Yes Setzer, Edman Circle, PA-C  allopurinol (ZYLOPRIM) 100 MG tablet Take 100 mg by mouth daily. Patient not taking: Reported on 06/15/2022 05/03/22    [provider]  HYDROcodone-acetaminophen (NORCO) 10-325 MG tablet Take 1 tablet by mouth 3 (three) times daily as needed for moderate pain. Patient not taking: Reported on 07/20/2022 06/08/22   Gwynne Edinger, MD  hydrOXYzine (ATARAX) 25 MG tablet Take 25 mg by mouth 3 (three) times daily. Patient not taking: Reported on 06/15/2022 04/17/22   [provider]  Lancets [facility] DELICA PLUS [id number]) [place] Apply topically daily. 05/03/22   [provider]  meclizine (ANTIVERT) 12.5 MG tablet Take 1 tablet (12.5 mg total) by mouth 2 (two) times daily as needed for dizziness. Patient not taking: Reported on 07/20/2022 05/07/22   Vaughan Basta, Edman Circle, PA-C  methocarbamol (ROBAXIN) 500 MG tablet Take 1 tablet (500 mg total) by mouth every 6 (six) hours as needed for muscle spasms. Patient not taking: Reported on 07/20/2022 05/19/22   Jennye Boroughs, MD  [facility] VERIO test strip daily. 05/03/22   [provider]  prochlorperazine (COMPAZINE) 5 MG tablet Take 1-2 tablets (5-10 mg total) by mouth every 6 (six) hours as needed for nausea. Patient not taking: Reported on 07/20/2022 05/07/22   Barbie Banner, PA-C  Vitamin D, Ergocalciferol, (DRISDOL) 1.25 MG (50000 UNIT) CAPS capsule Take 1 capsule (50,000 Units total) by mouth every 7 (seven) days. Patient not taking: Reported on 07/20/2022 05/08/22   Barbie Banner, PA-C    Current Facility-Administered Medications  Medication Dose Route Frequency Provider Last Rate Last Admin   acetaminophen (TYLENOL) tablet 650 mg  650 mg Oral Q4H PRN Lequita Halt, MD       ALPRAZolam Duanne Moron) tablet 1 mg  1 mg Oral BID Wynetta Fines T, MD   1 mg at 07/23/22 0930   amoxicillin-clavulanate (AUGMENTIN) 875-125 MG per tablet 1 tablet  1 tablet Oral Q12H Wynetta Fines T, MD   1 tablet at 07/23/22 0930   atorvastatin (LIPITOR) tablet 80 mg  80 mg Oral Daily Wynetta Fines T, MD   80 mg at 07/23/22 0930   calcium carbonate (OS-CAL - dosed in mg of  elemental calcium) tablet 1,250 mg  1 tablet Oral TID WC Wynetta Fines T, MD   1,250 mg at 07/23/22 1211   cloNIDine (CATAPRES) tablet 0.2 mg  0.2 mg Oral BID Wynetta Fines T, MD   0.2 mg at 07/23/22 0930   clopidogrel (PLAVIX) tablet 75 mg  75 mg Oral Daily Wynetta Fines T, MD   75 mg at 07/23/22 0930   enoxaparin (LOVENOX) injection 40 mg  40 mg Subcutaneous Q24H Wynetta Fines T, MD   40 mg at 07/22/22 2023   ferrous gluconate (FERGON) tablet 324 mg  324 mg Oral Verneda Skill, MD   324 mg at 07/23/22 0833   HYDROcodone-acetaminophen (NORCO) 10-325 MG per tablet 1 tablet  1 tablet Oral TID PRN Lequita Halt, MD   1 tablet at 07/23/22 5465   insulin aspart (novoLOG) injection 0-20 Units  0-20 Units Subcutaneous TID WC Wynetta Fines T, MD   4 Units at 07/23/22 1211   insulin aspart (novoLOG) injection 2 Units  2 Units Subcutaneous TID WC Patrecia Pour, MD   2 Units at 07/23/22 1211   insulin detemir (LEVEMIR) injection 10 Units  10 Units Subcutaneous QHS Patrecia Pour, MD   10 Units at 07/22/22 2229   losartan (COZAAR) tablet 100 mg  100 mg Oral Daily Wynetta Fines T, MD   100 mg at 07/23/22 0930   meclizine (ANTIVERT) tablet 12.5 mg  12.5 mg Oral BID PRN Lequita Halt, MD       methocarbamol (ROBAXIN) tablet 500 mg  500 mg Oral Q6H PRN Wynetta Fines T, MD   500 mg at 07/23/22 0608   ondansetron (ZOFRAN) tablet 4 mg  4 mg Oral Q6H Wynetta Fines T, MD   4 mg at 07/23/22 1211   pantoprazole (PROTONIX) EC tablet 40 mg  40 mg Oral Daily Wynetta Fines T, MD   40 mg at 07/23/22 0930   predniSONE (DELTASONE) tablet 40 mg  40 mg Oral Q breakfast Patrecia Pour, MD   40 mg at 07/23/22 6812   prochlorperazine (COMPAZINE) tablet 5 mg  5 mg Oral Q6H PRN Lequita Halt, MD       traZODone (DESYREL) tablet 150 mg  150 mg Oral QHS Wynetta Fines T, MD   150 mg at 07/22/22 2229   [START ON 08/01/2022] Vitamin D (Ergocalciferol) (  DRISDOL) 1.25 MG (50000 UNIT) capsule 50,000 Units  50,000 Units Oral Q7 days Lequita Halt, MD         Allergies as of 07/20/2022 - Review Complete 07/20/2022  Allergen Reaction Noted   Cucumber extract Anaphylaxis 08/15/2018   Molds & smuts Shortness Of Breath 04/14/2022   Norvasc [amlodipine] Other (See Comments) 08/20/2017   Other Anaphylaxis and Other (See Comments) 12/25/2017   Apple juice Hives 09/13/2019   Aspirin Rash 11/01/2016   Ibuprofen Rash 11/01/2016   Niacin and related Rash 11/01/2016    Review of Systems:    Constitutional: No weight loss, fever, chills, weakness or fatigue HEENT: Eyes: No change in vision               Ears, Nose, Throat:  No change in hearing or congestion Skin: No rash or itching Cardiovascular: No chest pain, chest pressure or palpitations   Respiratory: No SOB or cough Gastrointestinal: See HPI and otherwise negative Genitourinary: No dysuria or change in urinary frequency Neurological: No headache, dizziness or syncope Musculoskeletal: No new muscle or joint pain Hematologic: No bleeding or bruising Psychiatric: No history of depression or anxiety     Physical Exam:  Vital signs in last 24 hours: Temp:  [97.7 F (36.5 C)-98.5 F (36.9 C)] (P) 98.1 F (36.7 C) (08/03 0800) Pulse Rate:  [78-98] 78 (08/03 0357) Resp:  [14-18] 18 (08/03 0357) BP: (141-186)/(69-98) 160/81 (08/03 0357) SpO2:  [99 %-100 %] 100 % (08/03 0357) Last BM Date : 07/20/22 Last BM recorded by nurses in past 5 days No data recorded  General:   Pleasant, well developed female in no acute distress Head:  Normocephalic and atraumatic. Eyes: sclerae anicteric,conjunctive pink  Heart:  regular rate and rhythm Pulm: Clear anteriorly; no wheezing Abdomen:  Soft, Obese AB, Active bowel sounds. moderate tenderness in the epigastrium. Without guarding and Without rebound, No organomegaly appreciated. Extremities:  Without edema. Msk:  Symmetrical without gross deformities. Peripheral pulses intact.  Neurologic:  Alert and  oriented x4;  No focal deficits.  Skin:    Dry and intact without significant lesions or rashes. Psychiatric:  Cooperative. Normal mood and affect.  LAB RESULTS: Recent Labs    07/20/22 2112 07/21/22 0256  WBC  --  7.3  HGB 11.6* 10.7*  HCT 34.0* 34.0*  PLT  --  227   BMET Recent Labs    07/21/22 1801 07/22/22 0708 07/23/22 0556  NA 133* 136 136  K 3.5 3.8 4.7  CL 102 103 104  CO2 '25 25 25  ' GLUCOSE 256* 133* 175*  BUN 22* 19 19  CREATININE 1.54* 1.34* 1.09*  CALCIUM 7.1* 7.3* 8.0*   LFT Recent Labs    07/21/22 0256 07/21/22 1038 07/23/22 0556  PROT 5.9*   < > 5.4*  ALBUMIN 2.6*   < > 2.1*  AST 17   < > 17  ALT 16   < > 19  ALKPHOS 65   < > 65  BILITOT 1.3*   < > 0.5  BILIDIR <0.1  --   --   IBILI NOT CALCULATED  --   --    < > = values in this interval not displayed.   PT/INR No results for input(s): "LABPROT", "INR" in the last 72 hours.  STUDIES: VAS [place] LOWER EXTREMITY VENOUS (DVT)  Result Date: 07/23/2022  Lower Venous DVT Study Patient Name:  Erin Jackson  Date of Exam:   07/23/2022 Medical Rec #: [id number]  Accession #:    [id number] Date of Birth: 1966/01/03       Patient Gender: F Patient Age:   36 years Exam Location:  [facility] Procedure:      VAS [place] LOWER EXTREMITY VENOUS (DVT) Referring Phys: Vance Gather --------------------------------------------------------------------------------  Indications: Edema.  Comparison Study: 04/09/22, negative for DVT. Performing Technologist: Bobetta Lime BS, RVT  Examination Guidelines: A complete evaluation includes B-mode imaging, spectral Doppler, color Doppler, and power Doppler as needed of all accessible portions of each vessel. Bilateral testing is considered an integral part of a complete examination. Limited examinations for reoccurring indications may be performed as noted. The reflux portion of the exam is performed with the patient in reverse Trendelenburg.  +---------+---------------+---------+-----------+----------+--------------+ RIGHT     CompressibilityPhasicitySpontaneityPropertiesThrombus Aging +---------+---------------+---------+-----------+----------+--------------+ CFV      Full           Yes      Yes                                 +---------+---------------+---------+-----------+----------+--------------+ SFJ      Full                                                        +---------+---------------+---------+-----------+----------+--------------+ FV Prox  Full                                                        +---------+---------------+---------+-----------+----------+--------------+ FV Mid   Full                                                        +---------+---------------+---------+-----------+----------+--------------+ FV DistalFull                                                        +---------+---------------+---------+-----------+----------+--------------+ PFV      Full                                                        +---------+---------------+---------+-----------+----------+--------------+ POP      Full           Yes      Yes                                 +---------+---------------+---------+-----------+----------+--------------+ PTV      Full                                                        +---------+---------------+---------+-----------+----------+--------------+  PERO     Full                                                        +---------+---------------+---------+-----------+----------+--------------+   +---------+---------------+---------+-----------+----------+--------------+ LEFT     CompressibilityPhasicitySpontaneityPropertiesThrombus Aging +---------+---------------+---------+-----------+----------+--------------+ CFV      Full           Yes      Yes                                 +---------+---------------+---------+-----------+----------+--------------+ SFJ      Full                                                         +---------+---------------+---------+-----------+----------+--------------+ FV Prox  Full                                                        +---------+---------------+---------+-----------+----------+--------------+ FV Mid   Full                                                        +---------+---------------+---------+-----------+----------+--------------+ FV DistalFull                                                        +---------+---------------+---------+-----------+----------+--------------+ PFV      Full                                                        +---------+---------------+---------+-----------+----------+--------------+ POP      Full           Yes      Yes                                 +---------+---------------+---------+-----------+----------+--------------+ PTV      Full                                                        +---------+---------------+---------+-----------+----------+--------------+ PERO     Full                                                        +---------+---------------+---------+-----------+----------+--------------+  Summary: BILATERAL: - No evidence of deep vein thrombosis seen in the lower extremities, bilaterally. -No evidence of popliteal cyst, bilaterally.   *See table(s) above for measurements and observations.    Preliminary    DG ESOPHAGUS W SINGLE CM (SOL OR THIN BA)  Result Date: 07/22/2022 CLINICAL DATA:  56 year old female with history of GERD who presents with dysphagia as well as concern for aspiration pneumonia. Patient has no UGI related past surgical history. Speech evaluation yesterday did not reveal any site of aspiration. Request for single-contrast esophagram for further evaluation. EXAM: ESOPHAGUS/BARIUM SWALLOW/TABLET STUDY TECHNIQUE: Single contrast examination was performed using thin liquid barium. This exam was performed by Durenda Guthrie, PA-C, and was supervised  and interpreted by Macy Mis, MD. Exam was limited due to patient's immobility. FLUOROSCOPY: Radiation Exposure Index (as provided by the fluoroscopic device): 17.6 mGy Kerma COMPARISON:  None Available. FINDINGS: Swallowing: Appears normal. No vestibular penetration or aspiration seen on this limited exam. Pharynx: Unremarkable. Esophagus: No mass. Question of mild relative narrowing at the gastroesophageal junction. Esophageal motility: Within normal limits. Hiatal Hernia: None. Gastroesophageal reflux: None visualized. Ingested 29m barium tablet: Held up at the gastroesophageal junction. Did not pass during the course of study. Other: None. IMPRESSION: Question of mild relative narrowing at the gastroesophageal junction without obstruction to passage of contrast. The barium tablet was held up at the junction and did not pass during course of study. Electronically Signed   By: PMacy MisM.D.   On: 07/22/2022 12:36     AVladimir Crofts 07/23/2022, 1:11 PM

## 2022-07-23 NOTE — Progress Notes (Addendum)
Progress Note  Patient: Erin Jackson LZJ:[id number] DOB: 11-Aug-1966  DOA: 07/20/2022  DOS: 07/23/2022    Brief hospital course: Erin Jackson is a 56 y.o. female with medical history significant of IIDM, HTN, anxiety/depression, chronic vitamin D deficiency, presented with new onset of bilateral arms and fingers numbness and cramping and bilateral calf cramping.   Patient has diagnosed vitamin D deficiency however has not been not compliant with vitamin D supplement.  Yesterday evening, after dinner, patient developed bilateral forearm and hands and all 10 fingers numbness and overnight she developed cramping pain of bilateral forearm and hands.  This morning, she woke up and symptoms of normal hands numbness and cramping persisted, and when she also developed bilateral calf cramping. "Thinking of I might have a stroke".  No chest pain no abdominal pain.   Patient also reported productive cough with whitish phlegm for the last 2 to 3 days, no fever or chills.  At baseline, patient has had episodes of coughing and choking after eating solid food.  No history of aspiration denies any fever or chills.   ED Course: Blood pressure elevated.  Severe hypocalcemia, CA 5.4, corrected Ca 6.0.  CT head negative for acute finding but only chronic lacunar stroke.  Patient received IV calcium.  Assessment and Plan: Severe hypocalcemia, vitamin D deficiency: PTH is wnl indicating non-parathyroid-related etiology. Calcium corrects to normal limits as of 8/3, symptoms resolved. - Restarted home oral calcium supplement, giving vitamin D 50k units weekly, recheck in 8-12 weeks. Level on 7/31 was 25.  - Has received several doses of IV calcium, level this morning is 8.0, corrects to 9.5 (wnl) - Continue oral supplementation.   - Recheck labs in next week.  Bilateral carpal spasm: CT without stroke, no other focal deficits and this bilateral symptom is waxing/waning, suspected to be due to hypocalcemia.  -  Continue monitoring. Added antispasmodic with improvement. Does have history of CTS though this does not fit that symptomatology.  Hypomagnesemia: Improved with supplementation.   Aspiration pneumonia: Reports dysphagia and odynophagia at times as well as cough. CXR with RLL infiltrate.  - Continue augmentin planning 5 days Tx  Dysphagia, odynophagia: For solids >>> liquids. - Esophagram showed narrowing at distal esophagus. 59m barium tablet was held up at that site. Given her aspiration, ongoing cough, I have asked GI to evaluate whether endoscopic evaluation might be helpful.  - Continue PPI, prn compazine   Left leg swelling, pain: No cellulitis noted. Has been getting prophylactic enoxaparin. - Venous U/S pending.  Left eye vision loss: Stroke ruled out during recent admission. Empirically continued on steroids due to elevated inflammatory marker (ESR 69, CRP undetectable), though temporal artery biopsy from 6/13 revealed no specific histopathologic changes, specifically negative for inflammation, giant cells, granulomas, or fragmentation of elastic lamina.  - Temporal arteritis having been ruled out, we've started tapering prednisone 678m> 5032m 53m48mill need a prolonged taper since she's been on this for 7 weeks. - Recommend outpatient ophthalmology follow up   History of CVA:  - Continue plavix, statin.  - Pt believes her symptoms since this event, including left eye blindness, have made her unsafe to be home alone, we are pursuing placement for continued rehab at this time.  HTN: - Continue clonidine and losartan   IDT2DM: HbA1c 6.8%, with hyperglycemia - Continue resistant SSI, mealtime novolog (increase to 4u TIDWC), levemir 10u qHS. - Caution to avoid hypoglycemia in setting of steroid taper.  - Ate an entire homemade  pot pie yesterday afternoon resulting in CBG of 455. Dietary indiscretions are bound to make tight control difficult. Dietary counseling provided yesterday  and today. Changed back to carb-modified diet.  Debility, falls:  - PT/OT recommending SNF rehabilitation. D/w CSW anticipate stability 8/4.   PVD, CAD:  - Continue plavix and statin  Chronic combined HFrEF: LVEF 40-45%, global hypokinesis, G2DD.  - Volume replete with IVF, stopped IVF, will not give diuretic at this point, though may need this prn. - Continue ARB. Not on coreg or metoprolol for unclear reasons.   Stage IIIb CKD: CrCl worsened initially, now better with IVF. - Monitor Cr closely, avoiding nephrotoxins.   Iron deficiency anemia: Mild.  - Continue supplementation  Hypoalbuminemia:  - Dietitian consulted   Obesity: Estimated body mass index is 38.31 kg/m as calculated from the following:   Height as of this encounter: _0  (1.575 m).   Weight as of this encounter: 95 kg.  Subjective: Having constant pain in the left leg worse with movement, swelling worse than right leg. Spasm in hands is resolved.   Objective: Vitals:   07/23/22 0117 07/23/22 0349 07/23/22 0357 07/23/22 0800  BP: (!) 150/84 (!) 145/69 (!) 160/81   Pulse:  79 78   Resp:  18 18   Temp:  97.9 F (36.6 C) 97.8 F (36.6 C) (P) 98.1 F (36.7 C)  TempSrc:  Oral Oral (P) Oral  SpO2:  100% 100%   Weight:      Height:      Gen: 56 y.o. female in no distress Pulm: Nonlabored breathing room air. Clear. CV: Regular rate and rhythm. No murmur, rub, or gallop. No JVD, L > R LE pitting dependent edema. GI: Abdomen soft, non-tender, non-distended, with normoactive bowel sounds.  Ext: Warm, no deformities. +homan's on right, no palpable cords.  Skin: No acute rashes, lesions or ulcers on visualized skin. Neuro: Alert and oriented. No spasm/tetany, or focal neurological deficits. Psych: Judgement and insight appear fair. Mood euthymic & affect congruent. Behavior is appropriate.    Data Personally reviewed: CBC: Recent Labs  Lab 07/20/22 1307 07/20/22 2112 07/21/22 0256  WBC 10.6*  --  7.3   NEUTROABS 9.2*  --   --   HGB 11.1* 11.6* 10.7*  HCT 35.7* 34.0* 34.0*  MCV 90.8  --  89.7  PLT 228  --  269   Basic Metabolic Panel: Recent Labs  Lab 07/20/22 1216 07/20/22 1307 07/21/22 0256 07/21/22 1038 07/21/22 1801 07/22/22 0708 07/23/22 0556  NA  --    < > 138 136 133* 136 136  K  --    < > 4.2 3.7 3.5 3.8 4.7  CL  --    < > 103 102 102 103 104  CO2  --    < > _1 GLUCOSE  --    < > 206* 142* 256* 133* 175*  BUN  --    < > 19 21* 22* 19 19  CREATININE  --    < > 1.36* 1.46* 1.54* 1.34* 1.09*  CALCIUM 5.3*   < > 5.6* 6.2* 7.1* 7.3* 8.0*  MG <0.5*  --  1.3* 1.5*  --   --   --   PHOS 4.2  --   --   --   --   --   --    < > = values in this interval not displayed.   GFR: Estimated Creatinine Clearance: 60.5 mL/min (A) (by C-G  formula based on SCr of 1.09 mg/dL (H)). Liver Function Tests: Recent Labs  Lab 07/21/22 0256 07/21/22 1038 07/21/22 1801 07/22/22 0708 07/23/22 0556  AST _0 14* 17  ALT _1 ALKPHOS 65 58 72 55 65  BILITOT 1.3* 0.7 0.5 0.5 0.5  PROT 5.9* 5.1* 5.2* 5.5* 5.4*  ALBUMIN 2.6* 2.2* 2.2* 2.3* 2.1*    Recent Results (from the past 240 hour(s))  Resp Panel by RT-PCR (Flu A&B, Covid) Anterior Nasal Swab     Status: None   Collection Time: 07/20/22  3:43 PM   Specimen: Anterior Nasal Swab  Result Value Ref Range Status   SARS Coronavirus 2 by RT PCR NEGATIVE NEGATIVE Final    Comment: (NOTE) SARS-CoV-2 target nucleic acids are NOT DETECTED.  The SARS-CoV-2 RNA is generally detectable in upper respiratory specimens during the acute phase of infection. The lowest concentration of SARS-CoV-2 viral copies this assay can detect is 138 copies/mL. A negative result does not preclude SARS-Cov-2 infection and should not be used as the sole basis for treatment or other patient management decisions. A negative result may occur with  improper specimen collection/handling, submission of specimen other than nasopharyngeal  swab, presence of viral mutation(s) within the areas targeted by this assay, and inadequate number of viral copies(<138 copies/mL). A negative result must be combined with clinical observations, patient history, and epidemiological information. The expected result is Negative.  Fact Sheet for Patients:  EntrepreneurPulse.com.au  Fact Sheet for Healthcare Providers:  IncredibleEmployment.be  This test is no t yet approved or cleared by the [place] FDA and  has been authorized for detection and/or diagnosis of SARS-CoV-2 by FDA under an Emergency Use Authorization (EUA). This EUA will remain  in effect (meaning this test can be used) for the duration of the COVID-19 declaration under Section 564(b)(1) of the Act, 21 U.S.C.section 360bbb-3(b)(1), unless the authorization is terminated  or revoked sooner.       Influenza A by PCR NEGATIVE NEGATIVE Final   Influenza B by PCR NEGATIVE NEGATIVE Final    Comment: (NOTE) The Xpert Xpress SARS-CoV-2/FLU/RSV plus assay is intended as an aid in the diagnosis of influenza from Nasopharyngeal swab specimens and should not be used as a sole basis for treatment. Nasal washings and aspirates are unacceptable for Xpert Xpress SARS-CoV-2/FLU/RSV testing.  Fact Sheet for Patients: EntrepreneurPulse.com.au  Fact Sheet for Healthcare Providers: IncredibleEmployment.be  This test is not yet approved or cleared by the [place] FDA and has been authorized for detection and/or diagnosis of SARS-CoV-2 by FDA under an Emergency Use Authorization (EUA). This EUA will remain in effect (meaning this test can be used) for the duration of the COVID-19 declaration under Section 564(b)(1) of the Act, 21 U.S.C. section 360bbb-3(b)(1), unless the authorization is terminated or revoked.  Performed at [facility], [place] [address], [place], [place] [address]   Culture,  blood (routine x 2)     Status: None (Preliminary result)   Collection Time: 07/20/22  6:20 PM   Specimen: BLOOD  Result Value Ref Range Status   Specimen Description BLOOD RIGHT ANTECUBITAL  Final   Special Requests   Final    BOTTLES DRAWN AEROBIC AND ANAEROBIC Blood Culture results may not be optimal due to an inadequate volume of blood received in culture bottles   Culture   Final    NO GROWTH 2 DAYS Performed at [facility], [place] [address] [place], [place] [address]  Report Status PENDING  Incomplete  Culture, blood (routine x 2)     Status: None (Preliminary result)   Collection Time: 07/20/22  6:45 PM   Specimen: BLOOD  Result Value Ref Range Status   Specimen Description BLOOD BLOOD RIGHT HAND  Final   Special Requests   Final    BOTTLES DRAWN AEROBIC ONLY Blood Culture results may not be optimal due to an inadequate volume of blood received in culture bottles   Culture   Final    NO GROWTH 2 DAYS Performed at [facility], [place] [address], [place], [place] [address]    Report Status PENDING  Incomplete     DG ESOPHAGUS W SINGLE CM (SOL OR THIN BA)  Result Date: 07/22/2022 CLINICAL DATA:  56 year old female with history of GERD who presents with dysphagia as well as concern for aspiration pneumonia. Patient has no UGI related past surgical history. Speech evaluation yesterday did not reveal any site of aspiration. Request for single-contrast esophagram for further evaluation. EXAM: ESOPHAGUS/BARIUM SWALLOW/TABLET STUDY TECHNIQUE: Single contrast examination was performed using thin liquid barium. This exam was performed by Durenda Guthrie, PA-C, and was supervised and interpreted by Macy Mis, MD. Exam was limited due to patient's immobility. FLUOROSCOPY: Radiation Exposure Index (as provided by the fluoroscopic device): 17.6 mGy Kerma COMPARISON:  None Available. FINDINGS: Swallowing: Appears normal. No vestibular penetration or aspiration seen on this limited exam.  Pharynx: Unremarkable. Esophagus: No mass. Question of mild relative narrowing at the gastroesophageal junction. Esophageal motility: Within normal limits. Hiatal Hernia: None. Gastroesophageal reflux: None visualized. Ingested 13m barium tablet: Held up at the gastroesophageal junction. Did not pass during the course of study. Other: None. IMPRESSION: Question of mild relative narrowing at the gastroesophageal junction without obstruction to passage of contrast. The barium tablet was held up at the junction and did not pass during course of study. Electronically Signed   By: PMacy MisM.D.   On: 07/22/2022 12:36    Family Communication: Niece at bedside yesterday afternoon  Disposition: Status is: Inpatient Remains inpatient appropriate because: GI consulted for esophageal stenosis that may have contributed to aspiration pneumonia. Also evaluating LLE pain, r/o DVT.  Planned Discharge Destination: Skilled nursing facility    RPatrecia Pour MD 07/23/2022 11:46 AM Page by aShea [email or web address]

## 2022-07-23 NOTE — Progress Notes (Signed)
Bilateral LE venous duplex study completed. Please see CV Proc for preliminary results.  Abilene Mcphee BS, RVT 07/23/2022 12:01 PM

## 2022-07-23 NOTE — Plan of Care (Signed)
  Problem: Education: Goal: Ability to describe self-care measures that may prevent or decrease complications (Diabetes Survival Skills Education) will improve Outcome: Progressing   Problem: Coping: Goal: Ability to adjust to condition or change in health will improve Outcome: Not Progressing   Problem: Fluid Volume: Goal: Ability to maintain a balanced intake and output will improve Outcome: Progressing   Problem: Health Behavior/Discharge Planning: Goal: Ability to identify and utilize available resources and services will improve Outcome: Progressing Goal: Ability to manage health-related needs will improve Outcome: Progressing

## 2022-07-23 NOTE — Progress Notes (Signed)
Initial Nutrition Assessment  DOCUMENTATION CODES:   Obesity unspecified  INTERVENTION:  Encouraged balanced nutrition for blood sugar control Ordered low carbohydrate snacks to aid in satiety Briefly discussed nutrition for diabetes management and the importance for long term health "Plate Method" and "Carbohydrate Counting for People with Diabetes" handout added to AVS  NUTRITION DIAGNOSIS:   Increased nutrient needs related to acute illness as evidenced by estimated needs.  GOAL:   Patient will meet greater than or equal to 90% of their needs  MONITOR:   PO intake, Labs, Weight trends  REASON FOR ASSESSMENT:   Consult Assessment of nutrition requirement/status, Diet education  ASSESSMENT:   Pt admitted with new onset bilateral arms and finger numbness and cramping and bilateral calf cramping r/t hypocalcemia. PMH significant for T2DM, HTN, GERD, anxiety, depression, and chronic vitamin D deficiency.  Pt noted to have dysphagia/odynophagia with solids. S/p SLP evaluation and esophagram findings of narrowing at distal esophagus. GI evaluated. Recommend EGD for further work-up.  Food allergies: cucumber, pickles, nuts and apple juice  Spoke with pt at bedside. Observed 100% of lunch meal completed at time of visit. She expressed frustrations over diet order and states that she "does not like being told what to do" when it comes to her meals. At home she reports no declines to her baseline intake. She reports that she does not usually cook anymore as she is too weak and tired. Most of her meals consist of convenience foods and [employer]. She reports that she has been more mindful about sweetened beverages and including more fruit in her diet to help manage her blood sugar at home. At home she states that he blood sugars are usually low 100s despite being on steroids and aside from her chicken pot pie, she isn't sure why her blood sugars have been more elevated during admission.    Explained to pt concern with elevated blood sugar and the effects that chronically elevated blood sugar can have on long term health. Congratulated her as her HgbA1c has decreased from 9/8% (2020) down to 6.8% (04/2022). Briefly discussed ways to balance out her meals and prioritize adding vegetables and protein to her meals to help with blood sugar maintenance.   She reports that she is still feeling hungry after meals during admission. Will add a protein and fruit containing snack for meals to help with satiety.   Reviewed wt history. Her wt appeared to trend down between April and May, however noted to have remained stable. Current wt is 95 kg.   Edema: deep pitting BLE  Medications: augmentin, calcium carbonate, fergon, SSI 0-20 units TID, SSI 2 units TID, levemir 10 units qhs, zofran, protonix, sucralfate, Vitamin D q7days  Labs: HgbA1c 6/8%, CBG's 419, 191, 164, 261, 336, 455 x24 hours, ionized Ca <3.0, GFR 5.4   Diet Order:   Diet Order             Diet Carb Modified Fluid consistency: Thin; Room service appropriate? Yes  Diet effective now                   EDUCATION NEEDS:   Education needs have been addressed  Skin:  Skin Assessment: Reviewed RN Assessment  Last BM:  7/31  Height:   Ht Readings from Last 1 Encounters:  07/20/22 5\' 2"  (1.575 m)    Weight:   Wt Readings from Last 1 Encounters:  07/20/22 95 kg    Ideal Body Weight:  50 kg  BMI:  Body  mass index is 38.31 kg/m.  Estimated Nutritional Needs:   Kcal:  1500-1700  Protein:  65-80g  Fluid:  >/=1.5L  Drusilla Kanner, RDN, LDN Clinical Nutrition

## 2022-07-23 NOTE — Inpatient Diabetes Management (Signed)
Inpatient Diabetes Program Recommendations  AACE/ADA: New Consensus Statement on Inpatient Glycemic Control (2015)  Target Ranges:  Prepandial:   less than 140 mg/dL      Peak postprandial:   less than 180 mg/dL (1-2 hours)      Critically ill patients:  140 - 180 mg/dL   Lab Results  Component Value Date   GLUCAP 164 (H) 07/23/2022   HGBA1C 6.8 (H) 04/22/2022    Review of Glycemic Control  Latest Reference Range & Units 07/22/22 08:04 07/22/22 12:43 07/22/22 16:52 07/22/22 20:08 07/22/22 22:07 07/23/22 06:18  Glucose-Capillary 70 - 99 mg/dL 818 (H) 403 (H) 754 (H) 336 (H) 261 (H) 164 (H)  (H): Data is abnormally high  Diabetes history: DM2 Outpatient Diabetes medications: Levemir 20 QHS, Humalog 2-12 units BID, metformin 500 mg BID. On Pred 60 QD Current orders for Inpatient glycemic control: Levemir 10 units, Novolog 2 units tid meal Novolog 0-20 units TID, Pred 40 QD  Inpatient Diabetes Program Recommendations:   Post prandial CBGs continue to be elevated. Please consider: -Increase Novolog meal coverage to 4 units tid if eats 50%  Thank you, Darel Hong E. Tyleigh Mahn, RN, MSN, CDE  Diabetes Coordinator Inpatient Glycemic Control Team Team Pager [phone] (8am-5pm) 07/23/2022 10:45 AM

## 2022-07-23 NOTE — Progress Notes (Signed)
Attending notified regarding patient's elevated blood pressure via secure chat.

## 2022-07-24 DIAGNOSIS — I160 Hypertensive urgency: Secondary | ICD-10-CM

## 2022-07-24 LAB — BASIC METABOLIC PANEL
Anion gap: 6 (ref 5–15)
BUN: 21 mg/dL — ABNORMAL HIGH (ref 6–20)
CO2: 25 mmol/L (ref 22–32)
Calcium: 8.9 mg/dL (ref 8.9–10.3)
Chloride: 104 mmol/L (ref 98–111)
Creatinine, Ser: 1.13 mg/dL — ABNORMAL HIGH (ref 0.44–1.00)
GFR, Estimated: 56 mL/min — ABNORMAL LOW (ref >60)
Glucose, Bld: 224 mg/dL — ABNORMAL HIGH (ref 70–99)
Potassium: 4.9 mmol/L (ref 3.5–5.1)
Sodium: 135 mmol/L (ref 135–145)

## 2022-07-24 LAB — CBC
HCT: 30.7 % — ABNORMAL LOW (ref 36.0–46.0)
Hemoglobin: 9.7 g/dL — ABNORMAL LOW (ref 12.0–15.0)
MCH: 28.4 pg (ref 26.0–34.0)
MCHC: 31.6 g/dL (ref 30.0–36.0)
MCV: 89.8 fL (ref 80.0–100.0)
Platelets: 225 10*3/uL (ref 150–400)
RBC: 3.42 MIL/uL — ABNORMAL LOW (ref 3.87–5.11)
RDW: 14.9 % (ref 11.5–15.5)
WBC: 8 10*3/uL (ref 4.0–10.5)
nRBC: 0 % (ref 0.0–0.2)

## 2022-07-24 LAB — IRON AND TIBC
Iron: 47 ug/dL (ref 28–170)
Saturation Ratios: 20 % (ref 10.4–31.8)
TIBC: 235 ug/dL — ABNORMAL LOW (ref 250–450)
UIBC: 188 ug/dL

## 2022-07-24 LAB — GLUCOSE, CAPILLARY
Glucose-Capillary: 406 mg/dL — ABNORMAL HIGH (ref 70–99)
Glucose-Capillary: 297 mg/dL — ABNORMAL HIGH (ref 70–99)
Glucose-Capillary: 216 mg/dL — ABNORMAL HIGH (ref 70–99)
Glucose-Capillary: 334 mg/dL — ABNORMAL HIGH (ref 70–99)

## 2022-07-24 LAB — FERRITIN: Ferritin: 105 ng/mL (ref 11–307)

## 2022-07-24 MED ORDER — AMOXICILLIN-POT CLAVULANATE 875-125 MG PO TABS
1.0000 | ORAL_TABLET | Freq: Two times a day (BID) | ORAL | Status: AC
Start: 2022-07-24 — End: 2022-07-25
  Administered 2022-07-24 – 2022-07-25 (×2): 1 via ORAL
  Filled 2022-07-24 (×2): qty 1

## 2022-07-24 MED ORDER — INSULIN DETEMIR 100 UNIT/ML ~~LOC~~ SOLN
10.0000 [IU] | Freq: Two times a day (BID) | SUBCUTANEOUS | Status: DC
  Administered 2022-07-24 – 2022-07-27 (×7): 10 [IU] via SUBCUTANEOUS
  Filled 2022-07-24 (×8): qty 0.1

## 2022-07-24 MED ORDER — CALCIUM CARBONATE 1250 (500 CA) MG PO TABS
1.0000 | ORAL_TABLET | Freq: Two times a day (BID) | ORAL | Status: DC
  Administered 2022-07-24 – 2022-07-27 (×6): 1250 mg via ORAL
  Filled 2022-07-24 (×7): qty 1

## 2022-07-24 MED ORDER — HYDROCHLOROTHIAZIDE 12.5 MG PO TABS
12.5000 mg | ORAL_TABLET | Freq: Every day | ORAL | Status: DC
  Administered 2022-07-24: 12.5 mg via ORAL
  Filled 2022-07-24: qty 1

## 2022-07-24 MED ORDER — INSULIN DETEMIR 100 UNIT/ML ~~LOC~~ SOLN
20.0000 [IU] | Freq: Every day | SUBCUTANEOUS | Status: DC
Start: 2022-07-24 — End: 2022-07-24

## 2022-07-24 MED ORDER — INSULIN ASPART 100 UNIT/ML IJ SOLN
4.0000 [IU] | Freq: Three times a day (TID) | INTRAMUSCULAR | Status: DC
  Administered 2022-07-24 – 2022-07-27 (×8): 4 [IU] via SUBCUTANEOUS

## 2022-07-24 MED ORDER — INSULIN ASPART 100 UNIT/ML IJ SOLN
24.0000 [IU] | Freq: Once | INTRAMUSCULAR | Status: AC
  Administered 2022-07-24: 24 [IU] via SUBCUTANEOUS

## 2022-07-24 MED ORDER — METOPROLOL TARTRATE 25 MG PO TABS
25.0000 mg | ORAL_TABLET | Freq: Two times a day (BID) | ORAL | Status: DC
  Administered 2022-07-24 – 2022-07-27 (×7): 25 mg via ORAL
  Filled 2022-07-24 (×7): qty 1

## 2022-07-24 NOTE — Plan of Care (Signed)

## 2022-07-24 NOTE — TOC Progression Note (Signed)
Transition of Care [facility]) - Progression Note    Patient Details  Name: Erin Jackson MRN: [id number] Date of Birth: [birth year]  Transition of Care [facility]) CM/SW [place], [place] Phone Number: 07/24/2022, 2:37 PM  Clinical Narrative:   CSW met with patient to discuss bed offers, and patient asked for time to discuss with her niece prior to making a final decision. CSW to check back with patient for SNF choice.    Expected Discharge Plan: [place] Barriers to Discharge: Continued Medical Work up, Ship broker  Expected Discharge Plan and Services Expected Discharge Plan: [place] Choice: [place] arrangements for the past 2 months: Apartment                                       Social Determinants of Health (SDOH) Interventions    Readmission Risk Interventions     No data to display

## 2022-07-24 NOTE — Progress Notes (Addendum)
[facility] Progress Note  CC:    Dysphagia  Subjective: She denies having any N/V. She has upper abdominal cramping when she sits up in the bed and sometimes occurs when eating.   Objective:   Barium Swallow study 07/22/2022: Question of mild relative narrowing at the gastroesophageal junction without obstruction to passage of contrast. The barium tablet was held up at the junction and did not pass during course of study.  Vital signs in last 24 hours: Temp:  [98.3 F (36.8 C)-98.6 F (37 C)] 98.3 F (36.8 C) (08/04 0808) Pulse Rate:  [82-93] 93 (08/04 0955) Resp:  [19-20] 20 (08/04 0808) BP: (154-215)/(87-110) 154/100 (08/04 0955) SpO2:  [95 %-100 %] 99 % (08/04 0808) Last BM Date : 07/22/22 General: Alert 56 year old female in NAD. Heart: RRR, no murmur. Pulm:  Breath sounds clear throughout. Abdomen: Soft, nondistended. Nontender. Positive bowel sounds x 4 quads.  Extremities:  Without edema. Neurologic:  Alert and  oriented x 4. Grossly normal neurologically. Psych:  Alert and cooperative. Normal mood and affect.  Intake/Output from previous day: 08/03 0701 - 08/04 0700 In: -  Out: 2450 [Urine:2450] Intake/Output this shift: No intake/output data recorded.  Lab Results: Recent Labs    07/24/22 0609  WBC 8.0  HGB 9.7*  HCT 30.7*  PLT 225   BMET Recent Labs    07/22/22 0708 07/23/22 0556 07/24/22 0609  NA 136 136 135  K 3.8 4.7 4.9  CL 103 104 104  CO2 25 25 25   GLUCOSE 133* 175* 224*  BUN 19 19 21*  CREATININE 1.34* 1.09* 1.13*  CALCIUM 7.3* 8.0* 8.9   LFT Recent Labs    07/23/22 0556  PROT 5.4*  ALBUMIN 2.1*  AST 17  ALT 19  ALKPHOS 65  BILITOT 0.5   PT/INR No results for input(s): "LABPROT", "INR" in the last 72 hours. Hepatitis Panel No results for input(s): "HEPBSAG", "HCVAB", "HEPAIGM", "HEPBIGM" in the last 72 hours.  VAS 09/22/22 LOWER EXTREMITY VENOUS (DVT)  Result Date: 07/23/2022  Lower Venous DVT Study Patient  Name:  Erin Jackson  Date of Exam:   07/23/2022 Medical Rec #: [id number]/22/[id number]       Accession #:    [id number] Date of Birth: Sep 30, 1966       Patient Gender: F Patient Age:   53 years Exam Location:  [facility] Procedure:      VAS [facility] LOWER EXTREMITY VENOUS (DVT) Referring Phys: [place] --------------------------------------------------------------------------------  Indications: Edema.  Comparison Study: 04/09/22, negative for DVT. Performing Technologist: 04/11/22 BS, RVT  Examination Guidelines: A complete evaluation includes B-mode imaging, spectral Doppler, color Doppler, and power Doppler as needed of all accessible portions of each vessel. Bilateral testing is considered an integral part of a complete examination. Limited examinations for reoccurring indications may be performed as noted. The reflux portion of the exam is performed with the patient in reverse Trendelenburg.  +---------+---------------+---------+-----------+----------+--------------+ RIGHT    CompressibilityPhasicitySpontaneityPropertiesThrombus Aging +---------+---------------+---------+-----------+----------+--------------+ CFV      Full           Yes      Yes                                 +---------+---------------+---------+-----------+----------+--------------+ SFJ      Full                                                        +---------+---------------+---------+-----------+----------+--------------+  FV Prox  Full                                                        +---------+---------------+---------+-----------+----------+--------------+ FV Mid   Full                                                        +---------+---------------+---------+-----------+----------+--------------+ FV DistalFull                                                        +---------+---------------+---------+-----------+----------+--------------+ PFV      Full                                                         +---------+---------------+---------+-----------+----------+--------------+ POP      Full           Yes      Yes                                 +---------+---------------+---------+-----------+----------+--------------+ PTV      Full                                                        +---------+---------------+---------+-----------+----------+--------------+ PERO     Full                                                        +---------+---------------+---------+-----------+----------+--------------+   +---------+---------------+---------+-----------+----------+--------------+ LEFT     CompressibilityPhasicitySpontaneityPropertiesThrombus Aging +---------+---------------+---------+-----------+----------+--------------+ CFV      Full           Yes      Yes                                 +---------+---------------+---------+-----------+----------+--------------+ SFJ      Full                                                        +---------+---------------+---------+-----------+----------+--------------+ FV Prox  Full                                                        +---------+---------------+---------+-----------+----------+--------------+  FV Mid   Full                                                        +---------+---------------+---------+-----------+----------+--------------+ FV DistalFull                                                        +---------+---------------+---------+-----------+----------+--------------+ PFV      Full                                                        +---------+---------------+---------+-----------+----------+--------------+ POP      Full           Yes      Yes                                 +---------+---------------+---------+-----------+----------+--------------+ PTV      Full                                                         +---------+---------------+---------+-----------+----------+--------------+ PERO     Full                                                        +---------+---------------+---------+-----------+----------+--------------+     Summary: BILATERAL: - No evidence of deep vein thrombosis seen in the lower extremities, bilaterally. -No evidence of popliteal cyst, bilaterally.   *See table(s) above for measurements and observations. Electronically signed by Heath Lark on 07/23/2022 at 4:36:22 PM.    Final    DG ESOPHAGUS W SINGLE CM (SOL OR THIN BA)  Result Date: 07/22/2022 CLINICAL DATA:  56 year old female with history of GERD who presents with dysphagia as well as concern for aspiration pneumonia. Patient has no UGI related past surgical history. Speech evaluation yesterday did not reveal any site of aspiration. Request for single-contrast esophagram for further evaluation. EXAM: ESOPHAGUS/BARIUM SWALLOW/TABLET STUDY TECHNIQUE: Single contrast examination was performed using thin liquid barium. This exam was performed by Lawernce Ion, PA-C, and was supervised and interpreted by Guadlupe Spanish, MD. Exam was limited due to patient's immobility. FLUOROSCOPY: Radiation Exposure Index (as provided by the fluoroscopic device): 17.6 mGy [place] COMPARISON:  None Available. FINDINGS: Swallowing: Appears normal. No vestibular penetration or aspiration seen on this limited exam. Pharynx: Unremarkable. Esophagus: No mass. Question of mild relative narrowing at the gastroesophageal junction. Esophageal motility: Within normal limits. Hiatal Hernia: None. Gastroesophageal reflux: None visualized. Ingested 12mm barium tablet: Held up at the gastroesophageal junction. Did not pass during the course of study. Other: None. IMPRESSION: Question of mild relative narrowing at the gastroesophageal junction without  obstruction to passage of contrast. The barium tablet was held up at the junction and did not pass during course of study.  Electronically Signed   By: Macy Mis M.D.   On: 07/22/2022 12:36    Assessment / Plan:  56 year old female with a history of GERD was admitted to the hospital with severe hypocalcemia, vitamin D deficiency, hypomagnesemia and aspiration pneumonia who developed new onset dysphagia, coughing during eating and drinking. Barium swallow showed possible narrowing at GE junction. On Prednisone since the end of June for possible temporal arteritis, biopsy was negative she is currently being tapered off.  No overt GI bleeding -Diagnostic EGD to assess for an esophageal stricture as well as reflux vs candidiasis esophagitis on Plavix, scheduled Saturday 8/5 with Dr. Lyndel Safe, benefits and risks discussed including risk with sedation, risk of bleeding, perforation and infection  -Resume prior diet then NPO after midnight -Continue PPI bid   Aspiration pneumonia, resolving  On Augmentin, first day [id number]   Chronic systolic heart failure [id number] echocardiogram showed improved ejection fraction [id number] grade 2 diastolic dysfunction negative bubble study   CAD/PVD/history of CVA on Plavix   Normocytic anemia, likely due to CKD. No overt GI bleeding. Hg 11.6 -> 10.7 -> today Hg 9.7. Iron 47. Ferritin 105. TIBC 235. On Ferrous Sulfate po.   Stage IIIb chronic kidney disease   Type 2 diabetes         Principal Problem:   Hypocalcemia Active Problems:   Primary hypertension   T2DM (type 2 diabetes mellitus) (HCC)   GERD (gastroesophageal reflux disease)   CAD (coronary artery disease)   PVD (peripheral vascular disease) (HCC)   Stage 3b chronic kidney disease (HCC)   Chronic systolic heart failure (HCC)   Vision loss   Iron deficiency anemia   History of CVA (cerebrovascular accident)   Debility   Fall at home   Hypoalbuminemia   Obesity (BMI 30-39.9)     LOS: 3 days   Noralyn Pick  07/24/2022, 11:49 AM   Attending physician's note   I have taken history, reviewed  the chart and examined the patient. I performed a substantive portion of this encounter, including complete performance of at least one of the key components, in conjunction with the APP. I agree with the Advanced Practitioner's note, impression and recommendations.   GERD with eso dysphagia. Ba swallow showing GE Jn stricture. CAD/PVD/H/O CVA on plavix   Plan: -EGD with dil (if safe) in AM.  -Continue Protonix BID  I have discussed risks and benefits including higher risks of bleeding.  Benefits were also discussed.  She wishes to proceed.  Carmell Austria, MD [facility] [phone]

## 2022-07-24 NOTE — Progress Notes (Signed)
Physical Therapy Treatment Patient Details Name: Erin Jackson MRN: [id number] DOB: 09-11-1966 Today's Date: 07/24/2022   History of Present Illness Pt is a 56 y/o female admitted secondary to weakness and cramping. Found to have hypocalcemia. PMH includes CVA, DM, HTN, and vitamin D deficiency.    PT Comments    Some participation with acute therapy today. Willing to walk short distance in room with encouragement. Min guard-min assist with bed mobility, transfer, and gait training. Encouraged frequent periods out of bed to prevent secondary complications attributable to immobility. Patient will continue to benefit from skilled physical therapy services to further improve independence with functional mobility.    Recommendations for follow up therapy are one component of a multi-disciplinary discharge planning process, led by the attending physician.  Recommendations may be updated based on patient status, additional functional criteria and insurance authorization.  Follow Up Recommendations  Skilled nursing-short term rehab (<3 hours/day) Can patient physically be transported by private vehicle: Yes   Assistance Recommended at Discharge Intermittent Supervision/Assistance  Patient can return home with the following A little help with walking and/or transfers;A little help with bathing/dressing/bathroom;Help with stairs or ramp for entrance;Assist for transportation;Assistance with cooking/housework   Equipment Recommendations  None recommended by PT    Recommendations for Other Services       Precautions / Restrictions Precautions Precautions: Fall Restrictions Weight Bearing Restrictions: No     Mobility  Bed Mobility Overal bed mobility: Needs Assistance Bed Mobility: Supine to Sit     Supine to sit: Min guard     General bed mobility comments: Min guard for safety, increased time, use of bed rail.    Transfers Overall transfer level: Needs assistance Equipment  used: Rolling walker (2 wheels) Transfers: Sit to/from Stand Sit to Stand: Min guard           General transfer comment: Close guard for safety. Provided instructions several times and throughout transfer however pt not compliant for more efficent and safer techniques.    Ambulation/Gait Ambulation/Gait assistance: Min assist Gait Distance (Feet): 18 Feet Assistive device: Rolling walker (2 wheels) Gait Pattern/deviations: Decreased stride length, Staggering right, Staggering left Gait velocity: decreased Gait velocity interpretation: <1.31 ft/sec, indicative of household ambulator   General Gait Details: Erratic control of RW, following some instructions for safety and control. Instability noted with turns. Slow. Cues for safety, sequencing and AD use.   Stairs             Wheelchair Mobility    Modified Rankin (Stroke Patients Only)       Balance Overall balance assessment: Needs assistance Sitting-balance support: Feet supported, No upper extremity supported Sitting balance-Leahy Scale: Good Sitting balance - Comments: sits unsupported   Standing balance support: Bilateral upper extremity supported, During functional activity Standing balance-Leahy Scale: Poor Standing balance comment: Reliant on RW                            Cognition Arousal/Alertness: Awake/alert Behavior During Therapy: Flat affect Overall Cognitive Status: No family/caregiver present to determine baseline cognitive functioning                                 General Comments: Flat, but following commands with increased time. Pt requiring min verbal cues for safety with transfer but disregards instructions. Pt with difficulty understanding benefit of getting OOB to recliner and refusing recliner this date,  but agreeable to short-distance mobility.        Exercises General Exercises - Lower Extremity Ankle Circles/Pumps: AROM, Both, 10 reps, Seated     General Comments General comments (skin integrity, edema, etc.): Requires encouragement to participate. Educated on risks of immobility and remaining in bed for prolonged periods of time.      Pertinent Vitals/Pain Pain Assessment Pain Assessment: 0-10 Pain Score: 8  Pain Location: Left anterior lower leg Pain Descriptors / Indicators: Aching, Constant, Discomfort, Moaning Pain Intervention(s): Limited activity within patient's tolerance, Monitored during session, Repositioned    Home Living                          Prior Function            PT Goals (current goals can now be found in the care plan section) Acute Rehab PT Goals Patient Stated Goal: go to rehab PT Goal Formulation: With patient Time For Goal Achievement: 08/04/22 Potential to Achieve Goals: Good Progress towards PT goals: Progressing toward goals    Frequency    Min 2X/week      PT Plan Current plan remains appropriate    Co-evaluation              AM-PAC PT "6 Clicks" Mobility   Outcome Measure  Help needed turning from your back to your side while in a flat bed without using bedrails?: A Little Help needed moving from lying on your back to sitting on the side of a flat bed without using bedrails?: A Little Help needed moving to and from a bed to a chair (including a wheelchair)?: A Little Help needed standing up from a chair using your arms (e.g., wheelchair or bedside chair)?: A Little Help needed to walk in hospital room?: A Little Help needed climbing 3-5 steps with a railing? : A Lot 6 Click Score: 17    End of Session Equipment Utilized During Treatment: Gait belt Activity Tolerance: Other (comment) (Self-limited) Patient left: with call bell/phone within reach;with bed alarm set;in bed;with family/visitor present (Sitting EOB eating lunch, RN in room.)   PT Visit Diagnosis: Muscle weakness (generalized) (M62.81);Difficulty in walking, not elsewhere classified (R26.2)      Time: 8177-1165 PT Time Calculation (min) (ACUTE ONLY): 17 min  Charges:  $Gait Training: 8-22 mins                     Kathlyn Sacramento, PT    [place] 07/24/2022, 1:25 PM

## 2022-07-24 NOTE — TOC Progression Note (Signed)
Transition of Care [facility]) - Progression Note    Patient Details  Name: Erin Jackson MRN: [id number] Date of Birth: 09-Oct-1966  Transition of Care [facility]) CM/SW [place],  Phone Number: 07/24/2022, 2:38 PM  Clinical Narrative:   CSW met with patient to discuss SNF choice. Patient asking for [facility], but only if they have a private room. CSW confirmed private room availability with [facility], and patient in agreement. CSW requested initiation of insurance authorization for SNF. CSW to follow.    Expected Discharge Plan: [place] Barriers to Discharge: Continued Medical Work up, Ship broker  Expected Discharge Plan and Services Expected Discharge Plan: [place] Choice: [place] arrangements for the past 2 months: Apartment                                       Social Determinants of Health (SDOH) Interventions    Readmission Risk Interventions     No data to display

## 2022-07-24 NOTE — Progress Notes (Signed)
Progress Note  Patient: Erin Jackson FYT:[id number] DOB: Aug 19, 1966  DOA: 07/20/2022  DOS: 07/24/2022    Brief hospital course: CORINE SOLORIO is a 56 y.o. female with medical history significant of IIDM, HTN, anxiety/depression, chronic vitamin D deficiency, presented with new onset of bilateral arms and fingers numbness and cramping and bilateral calf cramping.   Patient has diagnosed vitamin D deficiency however has not been not compliant with vitamin D supplement.  Yesterday evening, after dinner, patient developed bilateral forearm and hands and all 10 fingers numbness and overnight she developed cramping pain of bilateral forearm and hands.  This morning, she woke up and symptoms of normal hands numbness and cramping persisted, and when she also developed bilateral calf cramping. "Thinking of I might have a stroke".  No chest pain no abdominal pain.   Patient also reported productive cough with whitish phlegm for the last 2 to 3 days, no fever or chills.  At baseline, patient has had episodes of coughing and choking after eating solid food.  No history of aspiration denies any fever or chills.   ED Course: Blood pressure elevated.  Severe hypocalcemia, CA 5.4, corrected Ca 6.0.  CT head negative for acute finding but only chronic lacunar stroke.  Patient received IV calcium.  Assessment and Plan: Severe hypocalcemia, vitamin D deficiency: PTH is wnl indicating non-parathyroid-related etiology. Calcium corrects to normal limits as of 8/3, symptoms resolved. - Restarted home oral calcium supplement, giving vitamin D 50k units weekly, recheck in 8-12 weeks. Level on 7/31 was 25.  - Calcium level is durably normalized. - Continue oral supplementation.   - Recheck labs in next week.  Bilateral carpal spasm: CT without stroke, no other focal deficits and this bilateral symptom is waxing/waning, suspected to be due to hypocalcemia.  - Resolved. Added antispasmodic with improvement. Does have  history of CTS though this does not fit that symptomatology.  Hypomagnesemia: Improved with supplementation.   Aspiration pneumonia: Reports dysphagia and odynophagia at times as well as cough. CXR with RLL infiltrate.  - Continue augmentin, will complete 10 doses.  Dysphagia, odynophagia: For solids > liquids. - Esophagram showed narrowing at distal esophagus. 26m barium tablet was held up at that site. GI consult appreciated, added carafate, entertaining idea of EGD. Diet was changed to clear liquids. - Continue PPI, prn compazine   Left leg swelling, pain: No cellulitis noted. Has been getting prophylactic enoxaparin. - Venous U/S negative. No obvious source of pain, though may be muscle spasm in relation to metabolic/electrolyte fluctuations.   Left eye vision loss: Stroke ruled out during recent admission. Empirically continued on steroids due to elevated inflammatory marker (ESR 69, CRP undetectable), though temporal artery biopsy from 6/13 revealed no specific histopathologic changes, specifically negative for inflammation, giant cells, granulomas, or fragmentation of elastic lamina.  - Temporal arteritis having been ruled out, we've started tapering prednisone 662m> 5084m 57m50mill need a prolonged taper since she's been on this for 7 weeks. - Recommend outpatient ophthalmology follow up   History of CVA:  - Continue plavix, statin.  - Pt believes her symptoms since this event, including left eye blindness, have made her unsafe to be home alone, we are pursuing placement for continued rehab at this time.   IDT2DM: HbA1c 6.8%, with hyperglycemia - Continue resistant SSI, mealtime novolog (increase to 4u TIDWC), increase levemir to 10u BID. - Caution to avoid hypoglycemia in setting of steroid taper.  - Dietary indiscretions are bound to make tight control  difficult. Dietary counseling provided. Changed back to carb-modified diet.  Debility, falls:  - PT/OT recommending SNF  rehabilitation. D/w CSW anticipate stability 8/4.   PVD, CAD:  - Continue plavix and statin  HTN with HTN urgency developing 8/3: Unclear precipitant of elevated BPs. No evidence of end-organ damage at this time, though did have headache (this may be stress-related as well) - Continue clonidine and losartan - Start metoprolol given her CHF history - Start HCTZ which should also help with dependent steroid-induced edema and hypocalcemia - prn hydralazine ordered.  Chronic combined HFrEF: LVEF 40-45%, global hypokinesis, G2DD.  - Volume replete with IVF, stopped IVF, will not give diuretic at this point, though may need this prn. - Continue ARB. Not on coreg or metoprolol previously. With elevated BP, started metoprolol. Will titrate dose and then consolidate to succinate prior to discharge.  Stage IIIb CKD: CrCl worsened initially, now better with IVF. - Monitor Cr closely, avoiding nephrotoxins.   Iron deficiency anemia: Mild, normocytic with replete iron stores based on ferritin (105). Suspect a component of anemia of CKD as well. - Continue supplementation - Iron deficiency not otherwise explained and age indicate need for colonoscopy, though this is not likely urgent.  Hypoalbuminemia:  - Dietitian consulted   Obesity: Estimated body mass index is 38.31 kg/m as calculated from the following:   Height as of this encounter: _0  (1.575 m).   Weight as of this encounter: 95 kg.  Subjective: Having headache mild, constant originating from neck posteriorly radiating forward globally since yesterday. No new complaints. States swelling in legs has improved. Still having the cough, sometimes with difficulty swallowing. No bleeding that she is aware of.  Objective: Vitals:   07/23/22 2012 07/24/22 0500 07/24/22 0808 07/24/22 0955  BP: (!) 209/98  (!) 160/90 (!) 154/100  Pulse: 87   93  Resp: 20  20   Temp: 98.4 F (36.9 C) 98.6 F (37 C) 98.3 F (36.8 C)   TempSrc: Oral Oral Oral    SpO2: 97% 95% 99%   Weight:      Height:      Gen: 56 y.o. female in no distress Pulm: Nonlabored breathing room air. Clear. CV: Regular rate and rhythm. No murmur, rub, or gallop. No JVD, improved, trace dependent edema. GI: Abdomen soft, non-tender, non-distended, with normoactive bowel sounds.  Ext: Warm, no deformities. No visible or palpable abnormality on left leg. No spasms. Skin: No rashes, lesions or ulcers on visualized skin. Neuro: Alert and oriented. No focal neurological deficits. Psych: Judgement and insight appear fair. Mood euthymic & affect congruent. Behavior is appropriate.    Data Personally reviewed: CBC: Recent Labs  Lab 07/20/22 1307 07/20/22 2112 07/21/22 0256 07/24/22 0609  WBC 10.6*  --  7.3 8.0  NEUTROABS 9.2*  --   --   --   HGB 11.1* 11.6* 10.7* 9.7*  HCT 35.7* 34.0* 34.0* 30.7*  MCV 90.8  --  89.7 89.8  PLT 228  --  227 845   Basic Metabolic Panel: Recent Labs  Lab 07/20/22 1216 07/20/22 1307 07/21/22 0256 07/21/22 1038 07/21/22 1801 07/22/22 0708 07/23/22 0556 07/24/22 0609  NA  --    < > 138 136 133* 136 136 135  K  --    < > 4.2 3.7 3.5 3.8 4.7 4.9  CL  --    < > 103 102 102 103 104 104  CO2  --    < > _1 25  GLUCOSE  --    < > 206* 142* 256* 133* 175* 224*  BUN  --    < > 19 21* 22* 19 19 21*  CREATININE  --    < > 1.36* 1.46* 1.54* 1.34* 1.09* 1.13*  CALCIUM 5.3*   < > 5.6* 6.2* 7.1* 7.3* 8.0* 8.9  MG <0.5*  --  1.3* 1.5*  --   --   --   --   PHOS 4.2  --   --   --   --   --   --   --    < > = values in this interval not displayed.   GFR: Estimated Creatinine Clearance: 58.3 mL/min (A) (by C-G formula based on SCr of 1.13 mg/dL (H)). Liver Function Tests: Recent Labs  Lab 07/21/22 0256 07/21/22 1038 07/21/22 1801 07/22/22 0708 07/23/22 0556  AST _0 14* 17  ALT _1 ALKPHOS 65 58 72 55 65  BILITOT 1.3* 0.7 0.5 0.5 0.5  PROT 5.9* 5.1* 5.2* 5.5* 5.4*  ALBUMIN 2.6* 2.2* 2.2* 2.3* 2.1*     Recent Results (from the past 240 hour(s))  Resp Panel by RT-PCR (Flu A&B, Covid) Anterior Nasal Swab     Status: None   Collection Time: 07/20/22  3:43 PM   Specimen: Anterior Nasal Swab  Result Value Ref Range Status   SARS Coronavirus 2 by RT PCR NEGATIVE NEGATIVE Final    Comment: (NOTE) SARS-CoV-2 target nucleic acids are NOT DETECTED.  The SARS-CoV-2 RNA is generally detectable in upper respiratory specimens during the acute phase of infection. The lowest concentration of SARS-CoV-2 viral copies this assay can detect is 138 copies/mL. A negative result does not preclude SARS-Cov-2 infection and should not be used as the sole basis for treatment or other patient management decisions. A negative result may occur with  improper specimen collection/handling, submission of specimen other than nasopharyngeal swab, presence of viral mutation(s) within the areas targeted by this assay, and inadequate number of viral copies(<138 copies/mL). A negative result must be combined with clinical observations, patient history, and epidemiological information. The expected result is Negative.  Fact Sheet for Patients:  EntrepreneurPulse.com.au  Fact Sheet for Healthcare Providers:  IncredibleEmployment.be  This test is no t yet approved or cleared by the [place] FDA and  has been authorized for detection and/or diagnosis of SARS-CoV-2 by FDA under an Emergency Use Authorization (EUA). This EUA will remain  in effect (meaning this test can be used) for the duration of the COVID-19 declaration under Section 564(b)(1) of the Act, 21 U.S.C.section 360bbb-3(b)(1), unless the authorization is terminated  or revoked sooner.       Influenza A by PCR NEGATIVE NEGATIVE Final   Influenza B by PCR NEGATIVE NEGATIVE Final    Comment: (NOTE) The Xpert Xpress SARS-CoV-2/FLU/RSV plus assay is intended as an aid in the diagnosis of influenza from  Nasopharyngeal swab specimens and should not be used as a sole basis for treatment. Nasal washings and aspirates are unacceptable for Xpert Xpress SARS-CoV-2/FLU/RSV testing.  Fact Sheet for Patients: EntrepreneurPulse.com.au  Fact Sheet for Healthcare Providers: IncredibleEmployment.be  This test is not yet approved or cleared by the [place] FDA and has been authorized for detection and/or diagnosis of SARS-CoV-2 by FDA under an Emergency Use Authorization (EUA). This EUA will remain in effect (meaning this test can be used) for the duration of the COVID-19 declaration under Section 564(b)(1) of the Act, 21 U.S.C. section  360bbb-3(b)(1), unless the authorization is terminated or revoked.  Performed at [facility], [place] [address], [place], [place] [address]   Culture, blood (routine x 2)     Status: None (Preliminary result)   Collection Time: 07/20/22  6:20 PM   Specimen: BLOOD  Result Value Ref Range Status   Specimen Description BLOOD RIGHT ANTECUBITAL  Final   Special Requests   Final    BOTTLES DRAWN AEROBIC AND ANAEROBIC Blood Culture results may not be optimal due to an inadequate volume of blood received in culture bottles   Culture   Final    NO GROWTH 3 DAYS Performed at [facility], [place] [address] [place], [place] [address]    Report Status PENDING  Incomplete  Culture, blood (routine x 2)     Status: None (Preliminary result)   Collection Time: 07/20/22  6:45 PM   Specimen: BLOOD  Result Value Ref Range Status   Specimen Description BLOOD BLOOD RIGHT HAND  Final   Special Requests   Final    BOTTLES DRAWN AEROBIC ONLY Blood Culture results may not be optimal due to an inadequate volume of blood received in culture bottles   Culture   Final    NO GROWTH 3 DAYS Performed at [facility], [place] [address], [place], [place] [address]    Report Status PENDING  Incomplete     VAS [place] LOWER EXTREMITY VENOUS  (DVT)  Result Date: 07/23/2022  Lower Venous DVT Study Patient Name:  MATY ZEISLER  Date of Exam:   07/23/2022 Medical Rec #: [id number]       Accession #:    [id number] Date of Birth: 12/27/1965       Patient Gender: F Patient Age:   65 years Exam Location:  [facility] Procedure:      VAS [place] LOWER EXTREMITY VENOUS (DVT) Referring Phys: Vance Gather --------------------------------------------------------------------------------  Indications: Edema.  Comparison Study: 04/09/22, negative for DVT. Performing Technologist: Bobetta Lime BS, RVT  Examination Guidelines: A complete evaluation includes B-mode imaging, spectral Doppler, color Doppler, and power Doppler as needed of all accessible portions of each vessel. Bilateral testing is considered an integral part of a complete examination. Limited examinations for reoccurring indications may be performed as noted. The reflux portion of the exam is performed with the patient in reverse Trendelenburg.  +---------+---------------+---------+-----------+----------+--------------+ RIGHT    CompressibilityPhasicitySpontaneityPropertiesThrombus Aging +---------+---------------+---------+-----------+----------+--------------+ CFV      Full           Yes      Yes                                 +---------+---------------+---------+-----------+----------+--------------+ SFJ      Full                                                        +---------+---------------+---------+-----------+----------+--------------+ FV Prox  Full                                                        +---------+---------------+---------+-----------+----------+--------------+ FV Mid   Full                                                        +---------+---------------+---------+-----------+----------+--------------+  FV DistalFull                                                         +---------+---------------+---------+-----------+----------+--------------+ PFV      Full                                                        +---------+---------------+---------+-----------+----------+--------------+ POP      Full           Yes      Yes                                 +---------+---------------+---------+-----------+----------+--------------+ PTV      Full                                                        +---------+---------------+---------+-----------+----------+--------------+ PERO     Full                                                        +---------+---------------+---------+-----------+----------+--------------+   +---------+---------------+---------+-----------+----------+--------------+ LEFT     CompressibilityPhasicitySpontaneityPropertiesThrombus Aging +---------+---------------+---------+-----------+----------+--------------+ CFV      Full           Yes      Yes                                 +---------+---------------+---------+-----------+----------+--------------+ SFJ      Full                                                        +---------+---------------+---------+-----------+----------+--------------+ FV Prox  Full                                                        +---------+---------------+---------+-----------+----------+--------------+ FV Mid   Full                                                        +---------+---------------+---------+-----------+----------+--------------+ FV DistalFull                                                        +---------+---------------+---------+-----------+----------+--------------+  PFV      Full                                                        +---------+---------------+---------+-----------+----------+--------------+ POP      Full           Yes      Yes                                  +---------+---------------+---------+-----------+----------+--------------+ PTV      Full                                                        +---------+---------------+---------+-----------+----------+--------------+ PERO     Full                                                        +---------+---------------+---------+-----------+----------+--------------+     Summary: BILATERAL: - No evidence of deep vein thrombosis seen in the lower extremities, bilaterally. -No evidence of popliteal cyst, bilaterally.   *See table(s) above for measurements and observations. Electronically signed by Jamelle Haring on 07/23/2022 at 4:36:22 PM.    Final    DG ESOPHAGUS W SINGLE CM (SOL OR THIN BA)  Result Date: 07/22/2022 CLINICAL DATA:  56 year old female with history of GERD who presents with dysphagia as well as concern for aspiration pneumonia. Patient has no UGI related past surgical history. Speech evaluation yesterday did not reveal any site of aspiration. Request for single-contrast esophagram for further evaluation. EXAM: ESOPHAGUS/BARIUM SWALLOW/TABLET STUDY TECHNIQUE: Single contrast examination was performed using thin liquid barium. This exam was performed by Durenda Guthrie, PA-C, and was supervised and interpreted by Macy Mis, MD. Exam was limited due to patient's immobility. FLUOROSCOPY: Radiation Exposure Index (as provided by the fluoroscopic device): 17.6 mGy Kerma COMPARISON:  None Available. FINDINGS: Swallowing: Appears normal. No vestibular penetration or aspiration seen on this limited exam. Pharynx: Unremarkable. Esophagus: No mass. Question of mild relative narrowing at the gastroesophageal junction. Esophageal motility: Within normal limits. Hiatal Hernia: None. Gastroesophageal reflux: None visualized. Ingested 42m barium tablet: Held up at the gastroesophageal junction. Did not pass during the course of study. Other: None. IMPRESSION: Question of mild relative narrowing at the  gastroesophageal junction without obstruction to passage of contrast. The barium tablet was held up at the junction and did not pass during course of study. Electronically Signed   By: PMacy MisM.D.   On: 07/22/2022 12:36    Family Communication: None at bedside this AM  Disposition: Status is: Inpatient Remains inpatient appropriate because: GI considering EGD. Titrating HTN medications for HTN urgency.  Planned Discharge Destination: Skilled nursing facility    RPatrecia Pour MD 07/24/2022 11:47 AM Page by [email or web address] [email or web address]

## 2022-07-24 NOTE — Progress Notes (Signed)
Occupational Therapy Treatment Patient Details Name: Erin Jackson MRN: [id number] DOB: December 07, 1966 Today's Date: 07/24/2022   History of present illness Pt is a 56 y/o female admitted secondary to weakness and cramping. Found to have hypocalcemia. PMH includes CVA, DM, HTN, and vitamin D deficiency.   OT comments  Pt progressing towards established OT goals. Pt agreeable to therapy session, but not agreeable to finishing session in recliner despite explanation of benefits of OOB. Pt donning socks with min guard A, and performing short distance functional mobility with min Guard A using RW. Challenging endurance, ROM and strength with BUE exercises this date. Pt with decreased endurance, and requiring one rest break. Pt eating at end of session, and using visual compensatory techniques to locate items on tray. Pt continues to present with decreased balance, coordination, strength, cognition, and activity tolerance. Pt observed with decreased mood state, and OT encourageing pt throughout. Recommend discharge to SNF for continued OT services to optimize safety and independence with ADL and IADL. Will continue to follow acutely as admitted.    Recommendations for follow up therapy are one component of a multi-disciplinary discharge planning process, led by the attending physician.  Recommendations may be updated based on patient status, additional functional criteria and insurance authorization.    Follow Up Recommendations  Skilled nursing-short term rehab (<3 hours/day)    Assistance Recommended at Discharge Intermittent Supervision/Assistance  Patient can return home with the following  A little help with walking and/or transfers;A little help with bathing/dressing/bathroom;Assistance with cooking/housework;Direct supervision/assist for medications management;Direct supervision/assist for financial management;Assist for transportation;Help with stairs or ramp for entrance   Equipment  Recommendations  Other (comment) (Defer to next venue)    Recommendations for Other Services      Precautions / Restrictions Precautions Precautions: Fall Restrictions Weight Bearing Restrictions: No       Mobility Bed Mobility Overal bed mobility: Needs Assistance Bed Mobility: Supine to Sit, Sit to Supine     Supine to sit: Min guard Sit to supine: Min guard   General bed mobility comments: Increased time. Min guard A for safety.    Transfers Overall transfer level: Needs assistance Equipment used: Rolling walker (2 wheels) Transfers: Sit to/from Stand Sit to Stand: Min guard           General transfer comment: Increased time for sit<>stand. Min verbal cues for hand placement and RW management on return to EOB     Balance Overall balance assessment: Needs assistance Sitting-balance support: Feet supported Sitting balance-Leahy Scale: Good Sitting balance - Comments: Weight shifting to bring thigh onto bed to don socks, and donning with min guard A for safety.   Standing balance support: Bilateral upper extremity supported, During functional activity Standing balance-Leahy Scale: Poor Standing balance comment: Reliant on RW                           ADL either performed or assessed with clinical judgement   ADL Overall ADL's : Needs assistance/impaired Eating/Feeding: Set up;Sitting Eating/Feeding Details (indicate cue type and reason): Finishing meal on arrival. Pt scanning tray to L and R to locate needed items at beginning of session, and agian at end of session, as she decided to eat her second cup of jello. Pt using BLE for self-feeding (holding bowl with LUE, and using spoon with RUE)                 Lower Body Dressing: Min guard;Sitting/lateral leans Lower  Body Dressing Details (indicate cue type and reason): Donning socks sitting EOB with min guard A for safety. Toilet Transfer: Min guard;Rolling walker (2 wheels);Comfort height  toilet Toilet Transfer Details (indicate cue type and reason): Simulated sit<>stand, ambulation, and return to EOB         Functional mobility during ADLs: Min guard;Rolling walker (2 wheels) General ADL Comments: Pt walking to sink, but reporting grooming tasks already performed this AM.    Extremity/Trunk Assessment Upper Extremity Assessment Upper Extremity Assessment: RUE deficits/detail;LUE deficits/detail RUE Deficits / Details: 3+/5 grip strength, 4-/5 push/pull, decreased speed of thumb opposition LUE Deficits / Details: 4-/5 grip strength, 4-/5 push/pull, decreased speed of thumb opposition; limited shoulder ROM from prior CVA   Lower Extremity Assessment Lower Extremity Assessment: Defer to PT evaluation        Vision   Additional Comments: Pt continuing to report no new changes in vision. Pt closing eyes during mobility, and when questioned, denies double vision and reporting she cannot see out of L eye anyway.   Perception     Praxis      Cognition Arousal/Alertness: Awake/alert Behavior During Therapy: Flat affect Overall Cognitive Status: No family/caregiver present to determine baseline cognitive functioning                                 General Comments: Flat, but following commands with increased time. Pt with decreased processing speed, sustained attention, and ability to follow multi-step commands. Pt requiring min verbal cues for safety with transfer. Pt with difficulty understanding benefit of getting OOB to recliner and refusing recliner this date, but agreeable to short-distance mobility.        Exercises Exercises: Other exercises Other Exercises Other Exercises: Shoulder flexion x10 BUE Other Exercises: Composite finger flexion/gross grasp x10 BUE    Shoulder Instructions       General Comments Pt agreeable to therapy sesssion.    Pertinent Vitals/ Pain       Pain Assessment Pain Assessment: 0-10 Pain Score: 8  Pain  Location: Left anterior lower leg Pain Descriptors / Indicators: Aching, Constant, Discomfort, Moaning Pain Intervention(s): Limited activity within patient's tolerance, Monitored during session, Repositioned, Relaxation  Home Living                                          Prior Functioning/Environment              Frequency  Min 2X/week        Progress Toward Goals  OT Goals(current goals can now be found in the care plan section)  Progress towards OT goals: Progressing toward goals  Acute Rehab OT Goals Patient Stated Goal: No pain OT Goal Formulation: With patient Time For Goal Achievement: 06/18/22 Potential to Achieve Goals: Good ADL Goals Pt Will Perform Grooming: with modified independence;standing Pt Will Perform Lower Body Dressing: with modified independence;sit to/from stand Pt Will Transfer to Toilet: with modified independence;ambulating;regular height toilet Pt Will Perform Toileting - Clothing Manipulation and hygiene: with modified independence;sit to/from stand Pt Will Perform Tub/Shower Transfer: Tub transfer;tub bench;rolling walker;ambulating Additional ADL Goal #1: Pt will utilize visual compensatory techniques to complete ADLs and navigate enviorment with modified independence.  Plan Frequency remains appropriate;Discharge plan remains appropriate    Co-evaluation  AM-PAC OT "6 Clicks" Daily Activity     Outcome Measure   Help from another person eating meals?: A Little Help from another person taking care of personal grooming?: A Little Help from another person toileting, which includes using toliet, bedpan, or urinal?: A Little Help from another person bathing (including washing, rinsing, drying)?: A Little Help from another person to put on and taking off regular upper body clothing?: A Little Help from another person to put on and taking off regular lower body clothing?: A Little 6 Click Score:  18    End of Session Equipment Utilized During Treatment: Gait belt;Rolling walker (2 wheels)  OT Visit Diagnosis: Other abnormalities of gait and mobility (R26.89);Muscle weakness (generalized) (M62.81);Low vision, both eyes (H54.2);Pain;Dizziness and giddiness (R42);Other symptoms and signs involving cognitive function;Unsteadiness on feet (R26.81) Pain - Right/Left: Left Pain - part of body: Leg   Activity Tolerance Patient tolerated treatment well   Patient Left in bed;with call bell/phone within reach;with bed alarm set;with nursing/sitter in room   Nurse Communication Mobility status        Time: 4580-9983 OT Time Calculation (min): 20 min  Charges: OT General Charges $OT Visit: 1 Visit OT Treatments $Self Care/Home Management : 8-22 mins  Ladene Artist, OTR/L [facility] Acute Rehabilitation Office: [phone]   Drue Novel 07/24/2022, 12:04 PM

## 2022-07-24 NOTE — H&P (View-Only) (Signed)
[facility] Progress Note  CC:    Dysphagia  Subjective: She denies having any N/V. She has upper abdominal cramping when she sits up in the bed and sometimes occurs when eating.   Objective:   Barium Swallow study 07/22/2022: Question of mild relative narrowing at the gastroesophageal junction without obstruction to passage of contrast. The barium tablet was held up at the junction and did not pass during course of study.  Vital signs in last 24 hours: Temp:  [98.3 F (36.8 C)-98.6 F (37 C)] 98.3 F (36.8 C) (08/04 0808) Pulse Rate:  [82-93] 93 (08/04 0955) Resp:  [19-20] 20 (08/04 0808) BP: (154-215)/(87-110) 154/100 (08/04 0955) SpO2:  [95 %-100 %] 99 % (08/04 0808) Last BM Date : 07/22/22 General: Alert 56 year old female in NAD. Heart: RRR, no murmur. Pulm:  Breath sounds clear throughout. Abdomen: Soft, nondistended. Nontender. Positive bowel sounds x 4 quads.  Extremities:  Without edema. Neurologic:  Alert and  oriented x 4. Grossly normal neurologically. Psych:  Alert and cooperative. Normal mood and affect.  Intake/Output from previous day: 08/03 0701 - 08/04 0700 In: -  Out: 2450 [Urine:2450] Intake/Output this shift: No intake/output data recorded.  Lab Results: Recent Labs    07/24/22 0609  WBC 8.0  HGB 9.7*  HCT 30.7*  PLT 225   BMET Recent Labs    07/22/22 0708 07/23/22 0556 07/24/22 0609  NA 136 136 135  K 3.8 4.7 4.9  CL 103 104 104  CO2 25 25 25   GLUCOSE 133* 175* 224*  BUN 19 19 21*  CREATININE 1.34* 1.09* 1.13*  CALCIUM 7.3* 8.0* 8.9   LFT Recent Labs    07/23/22 0556  PROT 5.4*  ALBUMIN 2.1*  AST 17  ALT 19  ALKPHOS 65  BILITOT 0.5   PT/INR No results for input(s): "LABPROT", "INR" in the last 72 hours. Hepatitis Panel No results for input(s): "HEPBSAG", "HCVAB", "HEPAIGM", "HEPBIGM" in the last 72 hours.  VAS 09/22/22 LOWER EXTREMITY VENOUS (DVT)  Result Date: 07/23/2022  Lower Venous DVT Study Patient  Name:  Erin Jackson  Date of Exam:   07/23/2022 Medical Rec #: [id number]/22/[id number]       Accession #:    [id number] Date of Birth: Sep 30, 1966       Patient Gender: F Patient Age:   53 years Exam Location:  [facility] Procedure:      VAS [facility] LOWER EXTREMITY VENOUS (DVT) Referring Phys: [place] --------------------------------------------------------------------------------  Indications: Edema.  Comparison Study: 04/09/22, negative for DVT. Performing Technologist: 04/11/22 BS, RVT  Examination Guidelines: A complete evaluation includes B-mode imaging, spectral Doppler, color Doppler, and power Doppler as needed of all accessible portions of each vessel. Bilateral testing is considered an integral part of a complete examination. Limited examinations for reoccurring indications may be performed as noted. The reflux portion of the exam is performed with the patient in reverse Trendelenburg.  +---------+---------------+---------+-----------+----------+--------------+ RIGHT    CompressibilityPhasicitySpontaneityPropertiesThrombus Aging +---------+---------------+---------+-----------+----------+--------------+ CFV      Full           Yes      Yes                                 +---------+---------------+---------+-----------+----------+--------------+ SFJ      Full                                                        +---------+---------------+---------+-----------+----------+--------------+  FV Prox  Full                                                        +---------+---------------+---------+-----------+----------+--------------+ FV Mid   Full                                                        +---------+---------------+---------+-----------+----------+--------------+ FV DistalFull                                                        +---------+---------------+---------+-----------+----------+--------------+ PFV      Full                                                         +---------+---------------+---------+-----------+----------+--------------+ POP      Full           Yes      Yes                                 +---------+---------------+---------+-----------+----------+--------------+ PTV      Full                                                        +---------+---------------+---------+-----------+----------+--------------+ PERO     Full                                                        +---------+---------------+---------+-----------+----------+--------------+   +---------+---------------+---------+-----------+----------+--------------+ LEFT     CompressibilityPhasicitySpontaneityPropertiesThrombus Aging +---------+---------------+---------+-----------+----------+--------------+ CFV      Full           Yes      Yes                                 +---------+---------------+---------+-----------+----------+--------------+ SFJ      Full                                                        +---------+---------------+---------+-----------+----------+--------------+ FV Prox  Full                                                        +---------+---------------+---------+-----------+----------+--------------+  FV Mid   Full                                                        +---------+---------------+---------+-----------+----------+--------------+ FV DistalFull                                                        +---------+---------------+---------+-----------+----------+--------------+ PFV      Full                                                        +---------+---------------+---------+-----------+----------+--------------+ POP      Full           Yes      Yes                                 +---------+---------------+---------+-----------+----------+--------------+ PTV      Full                                                         +---------+---------------+---------+-----------+----------+--------------+ PERO     Full                                                        +---------+---------------+---------+-----------+----------+--------------+     Summary: BILATERAL: - No evidence of deep vein thrombosis seen in the lower extremities, bilaterally. -No evidence of popliteal cyst, bilaterally.   *See table(s) above for measurements and observations. Electronically signed by Heath Lark on 07/23/2022 at 4:36:22 PM.    Final    DG ESOPHAGUS W SINGLE CM (SOL OR THIN BA)  Result Date: 07/22/2022 CLINICAL DATA:  56 year old female with history of GERD who presents with dysphagia as well as concern for aspiration pneumonia. Patient has no UGI related past surgical history. Speech evaluation yesterday did not reveal any site of aspiration. Request for single-contrast esophagram for further evaluation. EXAM: ESOPHAGUS/BARIUM SWALLOW/TABLET STUDY TECHNIQUE: Single contrast examination was performed using thin liquid barium. This exam was performed by Lawernce Ion, PA-C, and was supervised and interpreted by Guadlupe Spanish, MD. Exam was limited due to patient's immobility. FLUOROSCOPY: Radiation Exposure Index (as provided by the fluoroscopic device): 17.6 mGy Kerma COMPARISON:  None Available. FINDINGS: Swallowing: Appears normal. No vestibular penetration or aspiration seen on this limited exam. Pharynx: Unremarkable. Esophagus: No mass. Question of mild relative narrowing at the gastroesophageal junction. Esophageal motility: Within normal limits. Hiatal Hernia: None. Gastroesophageal reflux: None visualized. Ingested 12mm barium tablet: Held up at the gastroesophageal junction. Did not pass during the course of study. Other: None. IMPRESSION: Question of mild relative narrowing at the gastroesophageal junction without  obstruction to passage of contrast. The barium tablet was held up at the junction and did not pass during course of study.  Electronically Signed   By: Macy Mis M.D.   On: 07/22/2022 12:36    Assessment / Plan:  56 year old female with a history of GERD was admitted to the hospital with severe hypocalcemia, vitamin D deficiency, hypomagnesemia and aspiration pneumonia who developed new onset dysphagia, coughing during eating and drinking. Barium swallow showed possible narrowing at GE junction. On Prednisone since the end of June for possible temporal arteritis, biopsy was negative she is currently being tapered off.  No overt GI bleeding -Diagnostic EGD to assess for an esophageal stricture as well as reflux vs candidiasis esophagitis on Plavix, scheduled Saturday 8/5 with Dr. Lyndel Safe, benefits and risks discussed including risk with sedation, risk of bleeding, perforation and infection  -Resume prior diet then NPO after midnight -Continue PPI bid   Aspiration pneumonia, resolving  On Augmentin, first day [id number]   Chronic systolic heart failure [id number] echocardiogram showed improved ejection fraction [id number] grade 2 diastolic dysfunction negative bubble study   CAD/PVD/history of CVA on Plavix   Normocytic anemia, likely due to CKD. No overt GI bleeding. Hg 11.6 -> 10.7 -> today Hg 9.7. Iron 47. Ferritin 105. TIBC 235. On Ferrous Sulfate po.   Stage IIIb chronic kidney disease   Type 2 diabetes         Principal Problem:   Hypocalcemia Active Problems:   Primary hypertension   T2DM (type 2 diabetes mellitus) (HCC)   GERD (gastroesophageal reflux disease)   CAD (coronary artery disease)   PVD (peripheral vascular disease) (HCC)   Stage 3b chronic kidney disease (HCC)   Chronic systolic heart failure (HCC)   Vision loss   Iron deficiency anemia   History of CVA (cerebrovascular accident)   Debility   Fall at home   Hypoalbuminemia   Obesity (BMI 30-39.9)     LOS: 3 days   Noralyn Pick  07/24/2022, 11:49 AM   Attending physician's note   I have taken history, reviewed  the chart and examined the patient. I performed a substantive portion of this encounter, including complete performance of at least one of the key components, in conjunction with the APP. I agree with the Advanced Practitioner's note, impression and recommendations.   GERD with eso dysphagia. Ba swallow showing GE Jn stricture. CAD/PVD/H/O CVA on plavix   Plan: -EGD with dil (if safe) in AM.  -Continue Protonix BID  I have discussed risks and benefits including higher risks of bleeding.  Benefits were also discussed.  She wishes to proceed.  Carmell Austria, MD [facility] [phone]

## 2022-07-24 NOTE — Progress Notes (Signed)
   07/23/22 2012  Assess: MEWS Score  Temp 98.4 F (36.9 C)  BP (!) 209/98  MAP (mmHg) 129  Pulse Rate 87  Resp 20  SpO2 97 %  O2 Device Room Air  Assess: MEWS Score  MEWS Temp 0  MEWS Systolic 2  MEWS Pulse 0  MEWS RR 0  MEWS LOC 0  MEWS Score 2  MEWS Score Color Yellow  Assess: if the MEWS score is Yellow or Red  Were vital signs taken at a resting state? Yes  Focused Assessment No change from prior assessment  Does the patient meet 2 or more of the SIRS criteria? No  MEWS guidelines implemented *See Row Information* No, other (Comment)  Treat  MEWS Interventions Administered scheduled meds/treatments  Notify: Provider  Provider Name/Title Marcha Dutton, MD  Date Provider Notified 07/23/22  Time Provider Notified 2018  Method of Notification Page (CHAT)  Notification Reason Critical result  Provider response See new orders  Date of Provider Response 07/23/22  Time of Provider Response 2020  Document  Patient Outcome Stabilized after interventions  Assess: SIRS CRITERIA  SIRS Temperature  0  SIRS Pulse 0  SIRS Respirations  0  SIRS WBC 1  SIRS Score Sum  1

## 2022-07-25 ENCOUNTER — Encounter (HOSPITAL_COMMUNITY): Payer: Self-pay | Admitting: Family Medicine

## 2022-07-25 ENCOUNTER — Inpatient Hospital Stay (HOSPITAL_COMMUNITY): Payer: Medicare Other | Admitting: Anesthesiology

## 2022-07-25 ENCOUNTER — Encounter (HOSPITAL_COMMUNITY)
Admission: EM | Disposition: A | Discharge status: Skilled Nursing Facility | Payer: Self-pay | Source: Home / Self Care | Attending: Family Medicine

## 2022-07-25 DIAGNOSIS — R1310 Dysphagia, unspecified: Secondary | ICD-10-CM

## 2022-07-25 DIAGNOSIS — W19XXXA Unspecified fall, initial encounter: Secondary | ICD-10-CM | POA: Diagnosis not present

## 2022-07-25 DIAGNOSIS — I5022 Chronic systolic (congestive) heart failure: Secondary | ICD-10-CM | POA: Diagnosis not present

## 2022-07-25 DIAGNOSIS — K2970 Gastritis, unspecified, without bleeding: Secondary | ICD-10-CM

## 2022-07-25 DIAGNOSIS — I2510 Atherosclerotic heart disease of native coronary artery without angina pectoris: Secondary | ICD-10-CM

## 2022-07-25 DIAGNOSIS — I110 Hypertensive heart disease with heart failure: Secondary | ICD-10-CM

## 2022-07-25 DIAGNOSIS — K449 Diaphragmatic hernia without obstruction or gangrene: Secondary | ICD-10-CM

## 2022-07-25 HISTORY — PX: ESOPHAGOGASTRODUODENOSCOPY: SHX5428

## 2022-07-25 HISTORY — PX: BIOPSY: SHX5522

## 2022-07-25 LAB — CULTURE, BLOOD (ROUTINE X 2)
Culture: NO GROWTH
Culture: NO GROWTH

## 2022-07-25 LAB — GLUCOSE, CAPILLARY
Glucose-Capillary: 187 mg/dL — ABNORMAL HIGH (ref 70–99)
Glucose-Capillary: 145 mg/dL — ABNORMAL HIGH (ref 70–99)
Glucose-Capillary: 212 mg/dL — ABNORMAL HIGH (ref 70–99)
Glucose-Capillary: 306 mg/dL — ABNORMAL HIGH (ref 70–99)
Glucose-Capillary: 384 mg/dL — ABNORMAL HIGH (ref 70–99)

## 2022-07-25 SURGERY — EGD (ESOPHAGOGASTRODUODENOSCOPY)
Anesthesia: Monitor Anesthesia Care

## 2022-07-25 MED ORDER — ONDANSETRON HCL 4 MG/2ML IJ SOLN: 4.0000 mg | Freq: Once | INTRAMUSCULAR | Status: DC | PRN

## 2022-07-25 MED ORDER — HYDROCHLOROTHIAZIDE 25 MG PO TABS: 25.0000 mg | ORAL_TABLET | Freq: Every day | ORAL | Status: DC

## 2022-07-25 MED ORDER — HYDROCODONE-ACETAMINOPHEN 10-325 MG PO TABS: 0.5000 | ORAL_TABLET | Freq: Three times a day (TID) | ORAL | 0 refills | Status: DC | PRN

## 2022-07-25 MED ORDER — CLONIDINE HCL 0.1 MG PO TABS: 0.2000 mg | ORAL_TABLET | Freq: Two times a day (BID) | ORAL | Status: DC

## 2022-07-25 MED ORDER — LACTATED RINGERS IV SOLN: INTRAVENOUS | Status: DC | PRN

## 2022-07-25 MED ORDER — ONDANSETRON HCL 4 MG PO TABS: 4.0000 mg | ORAL_TABLET | Freq: Three times a day (TID) | ORAL | Status: AC | PRN

## 2022-07-25 MED ORDER — LIDOCAINE 2% (20 MG/ML) 5 ML SYRINGE
INTRAMUSCULAR | Status: DC | PRN
  Administered 2022-07-25: 60 mg via INTRAVENOUS

## 2022-07-25 MED ORDER — SUCRALFATE 1 G PO TABS: 1.0000 g | ORAL_TABLET | Freq: Three times a day (TID) | ORAL | 0 refills | Status: DC

## 2022-07-25 MED ORDER — CALCIUM CARBONATE 1250 (500 CA) MG PO TABS: 1.0000 | ORAL_TABLET | Freq: Two times a day (BID) | ORAL | Status: AC

## 2022-07-25 MED ORDER — PANTOPRAZOLE SODIUM 40 MG PO TBEC: 40.0000 mg | DELAYED_RELEASE_TABLET | Freq: Two times a day (BID) | ORAL | Status: AC

## 2022-07-25 MED ORDER — PREDNISONE 20 MG PO TABS: 40.0000 mg | ORAL_TABLET | Freq: Every day | ORAL | Status: DC

## 2022-07-25 MED ORDER — SODIUM CHLORIDE 0.9 % IV SOLN: INTRAVENOUS | Status: DC

## 2022-07-25 MED ORDER — HYDROCHLOROTHIAZIDE 25 MG PO TABS
25.0000 mg | ORAL_TABLET | Freq: Every day | ORAL | Status: DC
  Administered 2022-07-25 – 2022-07-26 (×2): 25 mg via ORAL
  Filled 2022-07-25 (×2): qty 1

## 2022-07-25 MED ORDER — PROPOFOL 500 MG/50ML IV EMUL
INTRAVENOUS | Status: DC | PRN
  Administered 2022-07-25: 150 ug/kg/min via INTRAVENOUS
  Administered 2022-07-25 (×2): 20 mg via INTRAVENOUS

## 2022-07-25 MED ORDER — ALPRAZOLAM 2 MG PO TABS: 1.0000 mg | ORAL_TABLET | Freq: Two times a day (BID) | ORAL | 0 refills | Status: DC

## 2022-07-25 MED ORDER — AMISULPRIDE (ANTIEMETIC) 5 MG/2ML IV SOLN: 10.0000 mg | Freq: Once | INTRAVENOUS | Status: DC | PRN

## 2022-07-25 MED ORDER — METOPROLOL SUCCINATE ER 25 MG PO TB24: 25.0000 mg | ORAL_TABLET | Freq: Every day | ORAL | Status: DC

## 2022-07-25 NOTE — Anesthesia Procedure Notes (Signed)
Procedure Name: MAC Date/Time: 07/25/2022 8:51 AM  Performed by: Barrington Ellison, CRNAPre-anesthesia Checklist: Patient identified, Emergency Drugs available, Suction available, Patient being monitored and Timeout performed Patient Re-evaluated:Patient Re-evaluated prior to induction Oxygen Delivery Method: Nasal cannula

## 2022-07-25 NOTE — Progress Notes (Signed)
Pt for dc to [facility] today, auth received and approval details provided to [facility] in admissions who confirmed they are prepared to admit pt to room 110.   RN called report then later received call from [facility] stating they are unable to accept pt until Monday due to inability to obtain pt medications. SW spoke to [employer] who reports he will need to discuss barrier to admission with his team and will update SW. TOC leadership made aware of barrier to dc.   Dellie Burns, MSW, LCSW [phone] (coverage)

## 2022-07-25 NOTE — Anesthesia Preprocedure Evaluation (Signed)
Anesthesia Evaluation    Reviewed: Allergy & Precautions, Patient's Chart, lab work & pertinent test results  Airway Mallampati: III  TM Distance: >3 FB Neck ROM: Full    Dental  (+) Teeth Intact, Dental Advisory Given   Pulmonary former smoker,    breath sounds clear to auscultation       Cardiovascular hypertension, Pt. on medications + CAD, + Peripheral Vascular Disease and +CHF   Rhythm:Regular Rate:Normal     Neuro/Psych Anxiety CVA    GI/Hepatic Neg liver ROS, GERD  Medicated and Controlled,  Endo/Other  diabetes, Type 2, Oral Hypoglycemic Agents  Renal/GU Renal disease     Musculoskeletal  (+) Arthritis ,   Abdominal Normal abdominal exam  (+)   Peds  Hematology   Anesthesia Other Findings   Reproductive/Obstetrics                             Anesthesia Physical Anesthesia Plan  ASA: 3  Anesthesia Plan: MAC   Post-op Pain Management:    Induction: Intravenous  PONV Risk Score and Plan: 0 and Propofol infusion  Airway Management Planned: Natural Airway and Simple Face Mask  Additional Equipment: None  Intra-op Plan:   Post-operative Plan:   Informed Consent: I have reviewed the patients History and Physical, chart, labs and discussed the procedure including the risks, benefits and alternatives for the proposed anesthesia with the patient or authorized representative who has indicated his/her understanding and acceptance.       Plan Discussed with: CRNA  Anesthesia Plan Comments:         Anesthesia Quick Evaluation

## 2022-07-25 NOTE — Interval H&P Note (Signed)
History and Physical Interval Note:  07/25/2022 9:14 AM  Erin Jackson  has presented today for surgery, with the diagnosis of Dysphagia, antiplatelet therapy, abnormal barium swallow.  The various methods of treatment have been discussed with the patient and family. After consideration of risks, benefits and other options for treatment, the patient has consented to  Procedure(s): ESOPHAGOGASTRODUODENOSCOPY (EGD) (N/A) as a surgical intervention.  The patient's history has been reviewed, patient examined, no change in status, stable for surgery.  I have reviewed the patient's chart and labs.  Questions were answered to the patient's satisfaction.     Lynann Bologna

## 2022-07-25 NOTE — Discharge Summary (Signed)
Physician Discharge Summary   Patient: Erin Jackson MRN: [id number] DOB: Nov 18, 1966  Admit date:     07/20/2022  Discharge date: 07/25/22  Discharge Physician: Patrecia Pour   PCP: [place]   Recommendations at discharge:  Follow up gastric biopsy performed at EGD 8/5. GI follow up for colonoscopy as well (due for screening and has otherwise unexplained iron deficiency). Monitoring CBC, BMP, and vitamin D levels recommended. Follow up with ophthalmology for left eye blindness.  Continue prednisone taper (was prescribed since June for possible temporal arteritis, though biopsy was negative).   Discharge Diagnoses: Principal Problem:   Hypocalcemia Active Problems:   Primary hypertension   T2DM (type 2 diabetes mellitus) (HCC)   GERD (gastroesophageal reflux disease)   CAD (coronary artery disease)   PVD (peripheral vascular disease) (HCC)   Stage 3b chronic kidney disease ([place])   Chronic systolic heart failure (HCC)   Vision loss   Iron deficiency anemia   History of CVA (cerebrovascular accident)   Debility   Fall at home   Hypoalbuminemia   Obesity (BMI 30-39.9)  Hospital Course: Per H&P: Erin Jackson is a 56 y.o. female with medical history significant of IIDM, HTN, anxiety/depression, chronic vitamin D deficiency, presented with new onset of bilateral arms and fingers numbness and cramping and bilateral calf cramping.   Patient has diagnosed vitamin D deficiency however has not been not compliant with vitamin D supplement.  Yesterday evening, after dinner, patient developed bilateral forearm and hands and all 10 fingers numbness and overnight she developed cramping pain of bilateral forearm and hands.  This morning, she woke up and symptoms of normal hands numbness and cramping persisted, and when she also developed bilateral calf cramping. "Thinking of I might have a stroke".  No chest pain no abdominal pain.   Patient also reported productive cough with  whitish phlegm for the last 2 to 3 days, no fever or chills.  At baseline, patient has had episodes of coughing and choking after eating solid food.  No history of aspiration denies any fever or chills.   ED Course: Blood pressure elevated.  Severe hypocalcemia, CA 5.4, corrected Ca 6.0.  CT head negative for acute finding but only chronic lacunar stroke.  Patient received IV calcium.  Problem-based hospital course: Severe hypocalcemia, vitamin D deficiency: PTH is wnl indicating non-parathyroid-related etiology. Calcium corrects to normal limits as of 8/3, symptoms resolved. - Restarted home oral calcium supplement, giving vitamin D 50k units weekly, recheck in 8-12 weeks. Level on 7/31 was 25.  - Calcium level is durably normalized. - Continue oral supplementation.   - Recheck labs in next week.   Bilateral carpal spasm: CT without stroke, no other focal deficits and this bilateral symptom is waxing/waning, suspected to be due to hypocalcemia.  - Resolved. Added antispasmodic with improvement. Does have history of CTS though this does not fit that symptomatology.   Hypomagnesemia: Improved with supplementation.    Aspiration pneumonia: Reports dysphagia and odynophagia at times as well as cough. CXR with RLL infiltrate.  - Continue augmentin, completed 10 doses.   Dysphagia, odynophagia: For solids > liquids. Esophagram showed narrowing at distal esophagus. 62mm barium tablet was held up at that site. GI consult appreciated, added carafate, EGD on 8/5 by Dr. Lyndel Safe showed a small hiatal hernia, gastritis (which was biopsied), and no esophageal stricture. - Continue SLP at SNF   Gastritis: Pending biopsy, suspect steroid-induced - Continue PPI increased to BID while on steroids, changed to  protonix due to plavix (avoiding omeprazole)  - Carafate x5 days  Left leg swelling, pain: No cellulitis noted. Venous U/S negative. No obvious source of pain, though may be muscle spasm in relation to  metabolic/electrolyte fluctuations. - Continue monitoring and supportive care. Swelling has been related to prednisone and is improving.   Left eye vision loss: Stroke ruled out during recent admission. Empirically continued on steroids due to elevated inflammatory marker (ESR 69, CRP undetectable), though temporal artery biopsy from 6/13 revealed no specific histopathologic changes, specifically negative for inflammation, giant cells, granulomas, or fragmentation of elastic lamina.  - Temporal arteritis having been ruled out, we've started tapering prednisone 60mg  > 50mg  > 40mg . Will need a prolonged taper since she's been on this for 7 weeks. - Recommend outpatient ophthalmology follow up    History of CVA:  - Continue plavix, statin.  - Pt believes her symptoms since this event, including left eye blindness, have made her unsafe to be home alone, we are pursuing placement for continued rehab at this time.   IDT2DM: HbA1c 6.8%, with steroid-induced hyperglycemia - Patient has been resistant to dietary counseling. These include pot pies and [employer] fried chicken despite being placed on carb-modified diet. Hyperglycemic excursions have occurred as expected postprandially. Recommend continuing levemir 20u daily and sliding scale insulin as well as ongoing dietary counseling. Anticipate need to decrease basal insulin as steroid taper continues.     Debility, falls:  - PT/OT recommending SNF rehabilitation.    PVD, CAD:  - Continue plavix and statin   HTN with HTN urgency developing 8/3: Suspect worsened by significant salt loads due to diet. No evidence of end-organ damage at this time, though did have headache (this may be stress-related as well). BP overall improved, not severe. - Continue clonidine and losartan - Started metoprolol given her CHF history - Start HCTZ which should also help with dependent steroid-induced edema and hypocalcemia, continue this at discharge.  - prn hydralazine  ordered.   Chronic combined HFrEF: LVEF 40-45%, global hypokinesis, G2DD.  - Appears euvolemic on day of discharge. - Continue ARB. Not on coreg or metoprolol previously. With elevated BP, started metoprolol. Will consolidate to succinate at discharge.   Stage IIIb CKD: CrCl worsened initially, now better. - Monitor Cr closely, avoiding nephrotoxins.    Iron deficiency anemia: Mild, normocytic with replete iron stores based on ferritin (105). Suspect a component of anemia of CKD as well. - Continue supplementation - Iron deficiency not otherwise explained and age indicate need for colonoscopy, though this is not urgent.   Hypoalbuminemia:  - Dietitian consulted    Obesity: Estimated body mass index is 38.31 kg/m as calculated from the following:   Height as of this encounter: 5\' 2"  (1.575 m).   Weight as of this encounter: 95 kg.  Consultants: GI Procedures performed:  EGD 07/25/2022 Dr. Lyndel Safe:  Impression:        - Small hiatal hernia.                           - Gastritis. Biopsied.                           - No esophageal strictures. We decided to hold off on empiric esophageal dilatation since patient is on Plavix. Recommendation:- Return patient to hospital ward for ongoing care.                           -  Resume previous diet.                           - Continue present medications including Protonix 40 mg p.o. daily.                           - Await pathology results.                           - Return to GI clinic in 4-6 weeks. If still with                            problems, further work-up.                           - The findings and recommendations were discussed with the patient.                           - Will sign off for now.  Disposition: Skilled nursing facility Diet recommendation:  Cardiac and Carb modified diet DISCHARGE MEDICATION: Allergies as of 07/25/2022       Reactions   Cucumber Extract Anaphylaxis   Molds & Smuts Shortness Of Breath    Norvasc [amlodipine] Other (See Comments)   Muscle became very tight   Other Anaphylaxis, Other (See Comments)   Tree nuts, walnuts, peanuts, pickles   Apple Juice Hives   Aspirin Rash   Ibuprofen Rash   Niacin And Related Rash        Medication List     STOP taking these medications    allopurinol 100 MG tablet Commonly known as: ZYLOPRIM   hydrOXYzine 25 MG tablet Commonly known as: ATARAX   meclizine 12.5 MG tablet Commonly known as: ANTIVERT   methocarbamol 500 MG tablet Commonly known as: ROBAXIN   omeprazole 40 MG capsule Commonly known as: PRILOSEC Replaced by: pantoprazole 40 MG tablet   prochlorperazine 5 MG tablet Commonly known as: COMPAZINE       TAKE these medications    acetaminophen 325 MG tablet Commonly known as: TYLENOL Take 2 tablets (650 mg total) by mouth every 4 (four) hours as needed for fever or mild pain.   alprazolam 2 MG tablet Commonly known as: XANAX Take 0.5 tablets (1 mg total) by mouth 2 (two) times daily. What changed: how much to take   atorvastatin 80 MG tablet Commonly known as: LIPITOR Take 1 tablet (80 mg total) by mouth daily. What changed: when to take this   calcium carbonate 1250 (500 Ca) MG tablet Commonly known as: OS-CAL - dosed in mg of elemental calcium Take 1 tablet (1,250 mg total) by mouth 2 (two) times daily with a meal.   cetirizine 10 MG tablet Commonly known as: ZyrTEC Allergy Take 1 tablet (10 mg total) by mouth daily. What changed:  when to take this reasons to take this   cloNIDine 0.1 MG tablet Commonly known as: CATAPRES Take 2 tablets (0.2 mg total) by mouth 2 (two) times daily.   clopidogrel 75 MG tablet Commonly known as: PLAVIX Take 1 tablet (75 mg total) by mouth daily.   EPINEPHrine 0.3 mg/0.3 mL Soaj injection Commonly known as: EPI-PEN Inject 0.3 mLs (0.3 mg total) into the muscle once as needed (allergic reaction). What  changed:  when to take this reasons to take this    ferrous gluconate 324 MG tablet Commonly known as: FERGON Take 324 mg by mouth every other day.   hydrochlorothiazide 25 MG tablet Commonly known as: HYDRODIURIL Take 1 tablet (25 mg total) by mouth daily.   HYDROcodone-acetaminophen 10-325 MG tablet Commonly known as: NORCO Take 0.5-1 tablets by mouth 3 (three) times daily as needed for moderate pain or severe pain. What changed:  how much to take reasons to take this   insulin lispro 100 UNIT/ML injection Commonly known as: HUMALOG Inject 2-12 Units into the skin 2 (two) times daily. Per sliding scale, if 70 to 200= 0 units, 201 to 250= 2 units, 251 to 300= 4 units 301 to 350= 6 units, 351 to 400= 8 units, 401 to 450= 10 units, 451 to 600= 12 units   Levemir FlexPen 100 UNIT/ML FlexPen Generic drug: insulin detemir Inject 20 Units into the skin at bedtime.   losartan 100 MG tablet Commonly known as: COZAAR Take 1 tablet (100 mg total) by mouth daily. What changed: how much to take   metFORMIN 500 MG tablet Commonly known as: GLUCOPHAGE Take 1 tablet (500 mg total) by mouth 2 (two) times daily with a meal.   metoprolol succinate 25 MG 24 hr tablet Commonly known as: Toprol XL Take 1 tablet (25 mg total) by mouth daily.   Narcan 4 MG/0.1ML Liqd nasal spray kit Generic drug: naloxone Place 0.4 mg into the nose as needed (opoid overdose).   nitroGLYCERIN 0.4 MG SL tablet Commonly known as: NITROSTAT Place 1 tablet (0.4 mg total) under the tongue every 5 (five) minutes as needed for chest pain.   ondansetron 4 MG tablet Commonly known as: ZOFRAN Take 1 tablet (4 mg total) by mouth every 8 (eight) hours as needed for nausea or vomiting. What changed:  when to take this reasons to take this   OneTouch Delica Plus [id number] Misc Apply topically daily.   OneTouch Verio test strip Generic drug: glucose blood daily.   pantoprazole 40 MG tablet Commonly known as: PROTONIX Take 1 tablet (40 mg total) by mouth 2  (two) times daily before a meal. Replaces: omeprazole 40 MG capsule   predniSONE 20 MG tablet Commonly known as: DELTASONE Take 2 tablets (40 mg total) by mouth daily. What changed: how much to take   sucralfate 1 g tablet Commonly known as: CARAFATE Take 1 tablet (1 g total) by mouth 3 (three) times daily before meals for 5 days.   traZODone 150 MG tablet Commonly known as: DESYREL Take 1 tablet (150 mg total) by mouth at bedtime.   Vitamin D (Ergocalciferol) 1.25 MG (50000 UNIT) Caps capsule Commonly known as: DRISDOL Take 1 capsule (50,000 Units total) by mouth every 7 (seven) days.        Contact information for follow-up providers     Center, [facility]. Schedule an appointment as soon as possible for a visit.   Contact information: [place] [address] [phone]         Jackquline Denmark, MD Follow up.   Specialties: Gastroenterology, Internal Medicine Why: to follow up gastric biopsy from 8/5 and arrange colonoscopy Contact information: [address]. [place] [address] [phone]         Alroy Dust, MD. Schedule an appointment as soon as possible for a visit.   Why: Ophthalmology follow up as soon as available post discharge Contact information: [place]  [place] [address] [phone]              Contact information for after-discharge care     Destination     [facility] Preferred SNF .   Service: Skilled Nursing Contact information: [place] [phone]                    Discharge Exam: Danley Danker Weights   07/20/22 2231  Weight: 95 kg  BP 111/66 (BP Location: Right Arm)   Pulse 84   Temp 98.2 F (36.8 C) (Oral)   Resp 18   Ht $R'5\' 2"'Tg$  (1.575 m)   Wt 95 kg   SpO2 100%   BMI 38.31 kg/m   Well-appearing obese female in no distress Clear, nonlabored on room air RRR without MRG. Edema improved in legs, no  deformities noted. Alert, oriented, stable left eye visual loss, decreased right side acuity grossly, no new focal deficits, ambulated with walker.   Condition at discharge: stable  The results of significant diagnostics from this hospitalization (including imaging, microbiology, ancillary and laboratory) are listed below for reference.   Imaging Studies: VAS [place] LOWER EXTREMITY VENOUS (DVT)  Result Date: 07/23/2022  Lower Venous DVT Study Patient Name:  DORRENE BENTLY  Date of Exam:   07/23/2022 Medical Rec #: [id number]       Accession #:    [id number] Date of Birth: 11/29/1966       Patient Gender: F Patient Age:   56 years Exam Location:  [facility] Procedure:      VAS [place] LOWER EXTREMITY VENOUS (DVT) Referring Phys: Vance Gather --------------------------------------------------------------------------------  Indications: Edema.  Comparison Study: 04/09/22, negative for DVT. Performing Technologist: Bobetta Lime BS, RVT  Examination Guidelines: A complete evaluation includes B-mode imaging, spectral Doppler, color Doppler, and power Doppler as needed of all accessible portions of each vessel. Bilateral testing is considered an integral part of a complete examination. Limited examinations for reoccurring indications may be performed as noted. The reflux portion of the exam is performed with the patient in reverse Trendelenburg.  +---------+---------------+---------+-----------+----------+--------------+ RIGHT    CompressibilityPhasicitySpontaneityPropertiesThrombus Aging +---------+---------------+---------+-----------+----------+--------------+ CFV      Full           Yes      Yes                                 +---------+---------------+---------+-----------+----------+--------------+ SFJ      Full                                                        +---------+---------------+---------+-----------+----------+--------------+ FV Prox  Full                                                         +---------+---------------+---------+-----------+----------+--------------+ FV Mid   Full                                                        +---------+---------------+---------+-----------+----------+--------------+  FV DistalFull                                                        +---------+---------------+---------+-----------+----------+--------------+ PFV      Full                                                        +---------+---------------+---------+-----------+----------+--------------+ POP      Full           Yes      Yes                                 +---------+---------------+---------+-----------+----------+--------------+ PTV      Full                                                        +---------+---------------+---------+-----------+----------+--------------+ PERO     Full                                                        +---------+---------------+---------+-----------+----------+--------------+   +---------+---------------+---------+-----------+----------+--------------+ LEFT     CompressibilityPhasicitySpontaneityPropertiesThrombus Aging +---------+---------------+---------+-----------+----------+--------------+ CFV      Full           Yes      Yes                                 +---------+---------------+---------+-----------+----------+--------------+ SFJ      Full                                                        +---------+---------------+---------+-----------+----------+--------------+ FV Prox  Full                                                        +---------+---------------+---------+-----------+----------+--------------+ FV Mid   Full                                                        +---------+---------------+---------+-----------+----------+--------------+ FV DistalFull                                                         +---------+---------------+---------+-----------+----------+--------------+  PFV      Full                                                        +---------+---------------+---------+-----------+----------+--------------+ POP      Full           Yes      Yes                                 +---------+---------------+---------+-----------+----------+--------------+ PTV      Full                                                        +---------+---------------+---------+-----------+----------+--------------+ PERO     Full                                                        +---------+---------------+---------+-----------+----------+--------------+     Summary: BILATERAL: - No evidence of deep vein thrombosis seen in the lower extremities, bilaterally. -No evidence of popliteal cyst, bilaterally.   *See table(s) above for measurements and observations. Electronically signed by Jamelle Haring on 07/23/2022 at 4:36:22 PM.    Final    DG ESOPHAGUS W SINGLE CM (SOL OR THIN BA)  Result Date: 07/22/2022 CLINICAL DATA:  56 year old female with history of GERD who presents with dysphagia as well as concern for aspiration pneumonia. Patient has no UGI related past surgical history. Speech evaluation yesterday did not reveal any site of aspiration. Request for single-contrast esophagram for further evaluation. EXAM: ESOPHAGUS/BARIUM SWALLOW/TABLET STUDY TECHNIQUE: Single contrast examination was performed using thin liquid barium. This exam was performed by Durenda Guthrie, PA-C, and was supervised and interpreted by Macy Mis, MD. Exam was limited due to patient's immobility. FLUOROSCOPY: Radiation Exposure Index (as provided by the fluoroscopic device): 17.6 mGy Kerma COMPARISON:  None Available. FINDINGS: Swallowing: Appears normal. No vestibular penetration or aspiration seen on this limited exam. Pharynx: Unremarkable. Esophagus: No mass. Question of mild relative narrowing at the  gastroesophageal junction. Esophageal motility: Within normal limits. Hiatal Hernia: None. Gastroesophageal reflux: None visualized. Ingested 1mm barium tablet: Held up at the gastroesophageal junction. Did not pass during the course of study. Other: None. IMPRESSION: Question of mild relative narrowing at the gastroesophageal junction without obstruction to passage of contrast. The barium tablet was held up at the junction and did not pass during course of study. Electronically Signed   By: Macy Mis M.D.   On: 07/22/2022 12:36   DG Chest 2 View  Result Date: 07/20/2022 CLINICAL DATA:  Cough. EXAM: CHEST - 2 VIEW COMPARISON:  Chest x-ray 06/19/2022 FINDINGS: Lung volumes are low. There are some patchy opacities in the right lower lobe. There is stable scarring in the left mid lung. The heart is mildly enlarged, unchanged. There is no pleural effusion or pneumothorax. No acute fractures are seen. IMPRESSION: 1. Right lower lobe atelectasis/airspace disease. 2. Stable cardiomegaly. Electronically Signed   By: Warren Lacy  Dagoberto Reef M.D.   On: 07/20/2022 16:07   CT HEAD WO CONTRAST  Result Date: 07/20/2022 CLINICAL DATA:  Patient complains of awakening with weakness this morning. EXAM: CT HEAD WITHOUT CONTRAST TECHNIQUE: Contiguous axial images were obtained from the base of the skull through the vertex without intravenous contrast. RADIATION DOSE REDUCTION: This exam was performed according to the departmental dose-optimization program which includes automated exposure control, adjustment of the mA and/or kV according to patient size and/or use of iterative reconstruction technique. COMPARISON:  06/15/2022 FINDINGS: Brain: No evidence of acute infarction, hemorrhage, hydrocephalus, extra-axial collection or mass lesion/mass effect. Chronic lacunar infarct within the left basal ganglia and right-side of pons identified. Vascular: No hyperdense vessel or unexpected calcification. Skull: Normal. Negative for  fracture or focal lesion. Sinuses/Orbits: No acute finding. Other: None IMPRESSION: 1. No acute intracranial abnormalities. 2. Chronic lacunar infarcts within the left basal ganglia and right-side of pons. Electronically Signed   By: Kerby Moors M.D.   On: 07/20/2022 14:43    Microbiology: Results for orders placed or performed during the hospital encounter of 07/20/22  Resp Panel by RT-PCR (Flu A&B, Covid) Anterior Nasal Swab     Status: None   Collection Time: 07/20/22  3:43 PM   Specimen: Anterior Nasal Swab  Result Value Ref Range Status   SARS Coronavirus 2 by RT PCR NEGATIVE NEGATIVE Final    Comment: (NOTE) SARS-CoV-2 target nucleic acids are NOT DETECTED.  The SARS-CoV-2 RNA is generally detectable in upper respiratory specimens during the acute phase of infection. The lowest concentration of SARS-CoV-2 viral copies this assay can detect is 138 copies/mL. A negative result does not preclude SARS-Cov-2 infection and should not be used as the sole basis for treatment or other patient management decisions. A negative result may occur with  improper specimen collection/handling, submission of specimen other than nasopharyngeal swab, presence of viral mutation(s) within the areas targeted by this assay, and inadequate number of viral copies(<138 copies/mL). A negative result must be combined with clinical observations, patient history, and epidemiological information. The expected result is Negative.  Fact Sheet for Patients:  EntrepreneurPulse.com.au  Fact Sheet for Healthcare Providers:  IncredibleEmployment.be  This test is no t yet approved or cleared by the [place] FDA and  has been authorized for detection and/or diagnosis of SARS-CoV-2 by FDA under an Emergency Use Authorization (EUA). This EUA will remain  in effect (meaning this test can be used) for the duration of the COVID-19 declaration under Section 564(b)(1) of the Act,  21 U.S.C.section 360bbb-3(b)(1), unless the authorization is terminated  or revoked sooner.       Influenza A by PCR NEGATIVE NEGATIVE Final   Influenza B by PCR NEGATIVE NEGATIVE Final    Comment: (NOTE) The Xpert Xpress SARS-CoV-2/FLU/RSV plus assay is intended as an aid in the diagnosis of influenza from Nasopharyngeal swab specimens and should not be used as a sole basis for treatment. Nasal washings and aspirates are unacceptable for Xpert Xpress SARS-CoV-2/FLU/RSV testing.  Fact Sheet for Patients: EntrepreneurPulse.com.au  Fact Sheet for Healthcare Providers: IncredibleEmployment.be  This test is not yet approved or cleared by the [place] FDA and has been authorized for detection and/or diagnosis of SARS-CoV-2 by FDA under an Emergency Use Authorization (EUA). This EUA will remain in effect (meaning this test can be used) for the duration of the COVID-19 declaration under Section 564(b)(1) of the Act, 21 U.S.C. section 360bbb-3(b)(1), unless the authorization is terminated or revoked.  Performed at [facility]  Lab, [address], [place], [place] [address]   Culture, blood (routine x 2)     Status: None   Collection Time: 07/20/22  6:20 PM   Specimen: BLOOD  Result Value Ref Range Status   Specimen Description BLOOD RIGHT ANTECUBITAL  Final   Special Requests   Final    BOTTLES DRAWN AEROBIC AND ANAEROBIC Blood Culture results may not be optimal due to an inadequate volume of blood received in culture bottles   Culture   Final    NO GROWTH 5 DAYS Performed at [facility], [place] [address], [place], [place] [address]    Report Status 07/25/2022 FINAL  Final  Culture, blood (routine x 2)     Status: None   Collection Time: 07/20/22  6:45 PM   Specimen: BLOOD  Result Value Ref Range Status   Specimen Description BLOOD BLOOD RIGHT HAND  Final   Special Requests   Final    BOTTLES DRAWN AEROBIC ONLY Blood Culture  results may not be optimal due to an inadequate volume of blood received in culture bottles   Culture   Final    NO GROWTH 5 DAYS Performed at [facility], [place] [address], [place], [place] [address]    Report Status 07/25/2022 FINAL  Final    Labs: CBC: Recent Labs  Lab 07/20/22 1307 07/20/22 2112 07/21/22 0256 07/24/22 0609  WBC 10.6*  --  7.3 8.0  NEUTROABS 9.2*  --   --   --   HGB 11.1* 11.6* 10.7* 9.7*  HCT 35.7* 34.0* 34.0* 30.7*  MCV 90.8  --  89.7 89.8  PLT 228  --  227 903   Basic Metabolic Panel: Recent Labs  Lab 07/20/22 1216 07/20/22 1307 07/21/22 0256 07/21/22 1038 07/21/22 1801 07/22/22 0708 07/23/22 0556 07/24/22 0609  NA  --    < > 138 136 133* 136 136 135  K  --    < > 4.2 3.7 3.5 3.8 4.7 4.9  CL  --    < > 103 102 102 103 104 104  CO2  --    < > $R'24 25 25 25 25 25  'vE$ GLUCOSE  --    < > 206* 142* 256* 133* 175* 224*  BUN  --    < > 19 21* 22* 19 19 21*  CREATININE  --    < > 1.36* 1.46* 1.54* 1.34* 1.09* 1.13*  CALCIUM 5.3*   < > 5.6* 6.2* 7.1* 7.3* 8.0* 8.9  MG <0.5*  --  1.3* 1.5*  --   --   --   --   PHOS 4.2  --   --   --   --   --   --   --    < > = values in this interval not displayed.   Liver Function Tests: Recent Labs  Lab 07/21/22 0256 07/21/22 1038 07/21/22 1801 07/22/22 0708 07/23/22 0556  AST $Re'17 17 16 'ZHN$ 14* 17  ALT $Re'16 12 12 11 19  'OfG$ ALKPHOS 65 58 72 55 65  BILITOT 1.3* 0.7 0.5 0.5 0.5  PROT 5.9* 5.1* 5.2* 5.5* 5.4*  ALBUMIN 2.6* 2.2* 2.2* 2.3* 2.1*   CBG: Recent Labs  Lab 07/24/22 1625 07/24/22 2135 07/25/22 0606 07/25/22 0845 07/25/22 1131  GLUCAP 216* 334* 187* 145* 212*    Discharge time spent: greater than 30 minutes.  Signed: Patrecia Pour, MD [facility] 07/25/2022

## 2022-07-25 NOTE — Op Note (Signed)
[facility] Patient Name: Erin Jackson Procedure Date : 07/25/2022 MRN: [id number] Attending MD: Lynann Bologna , MD Date of Birth: 1966/04/22 CSN: [id number] Age: 56 Admit Type: Inpatient Procedure:                Upper GI endoscopy Indications:              Dysphagia with Ba Swallow with hang up of barium                            tablet in distal esophagus Providers:                Lynann Bologna, MD, Fransisca Connors, Rozetta Nunnery, Technician Referring MD:              Medicines:                Monitored Anesthesia Care Complications:            No immediate complications. Estimated Blood Loss:     Estimated blood loss: none. Procedure:                Pre-Anesthesia Assessment:                           - Prior to the procedure, a History and Physical                            was performed, and patient medications and                            allergies were reviewed. The patient's tolerance of                            previous anesthesia was also reviewed. The risks                            and benefits of the procedure and the sedation                            options and risks were discussed with the patient.                            All questions were answered, and informed consent                            was obtained. Prior Anticoagulants: The patient has                            taken Plavix (clopidogrel), last dose was 1 day                            prior to procedure. ASA Grade Assessment: III - A  patient with severe systemic disease. After                            reviewing the risks and benefits, the patient was                            deemed in satisfactory condition to undergo the                            procedure.                           After obtaining informed consent, the endoscope was                            passed under direct vision. Throughout the                             procedure, the patient's blood pressure, pulse, and                            oxygen saturations were monitored continuously. The                            [id number]) Olympus endoscope was introduced                            through the mouth, and advanced to the second part                            of duodenum. The upper GI endoscopy was                            accomplished without difficulty. The patient                            tolerated the procedure well. Scope In: Scope Out: Findings:      The examined esophagus was normal with well-defined Z-line. No obvious       strictures. We decided to hold off on empiric dilatation since patient       is on Plavix      A small hiatal hernia was present.      Scattered mild inflammation characterized by erythema was found in the       gastric antrum. 2 superficial biopsies were taken with a cold forceps       for histology.      The examined duodenum was normal.      The exam was otherwise without abnormality. Impression:               - Small hiatal hernia.                           - Gastritis. Biopsied.                           - No esophageal strictures. We  decided to hold off                            on empiric esophageal dilatation since patient is                            on Plavix. Recommendation:           - Return patient to hospital ward for ongoing care.                           - Resume previous diet.                           - Continue present medications including Protonix                            40 mg p.o. daily.                           - Await pathology results.                           - Return to GI clinic in 4-6 weeks. If still with                            problems, further work-up.                           - The findings and recommendations were discussed                            with the patient.                           - Will sign off for now. Procedure Code(s):         --- Professional ---                           [phone], Esophagogastroduodenoscopy, flexible,                            transoral; with biopsy, single or multiple Diagnosis Code(s):        --- Professional ---                           K44.9, Diaphragmatic hernia without obstruction or                            gangrene                           K29.70, Gastritis, unspecified, without bleeding                           R13.10, Dysphagia, unspecified CPT copyright 2019 [employer]. All rights reserved. The codes documented in this report are preliminary and upon coder review  may  be revised to meet current compliance requirements. Lynann Bologna, MD 07/25/2022 9:14:03 AM This report has been signed electronically. Number of Addenda: 0

## 2022-07-25 NOTE — Anesthesia Postprocedure Evaluation (Signed)
Anesthesia Post Note  Patient: Erin Jackson  Procedure(s) Performed: ESOPHAGOGASTRODUODENOSCOPY (EGD)     Patient location during evaluation: PACU Anesthesia Type: MAC Level of consciousness: awake and alert Pain management: pain level controlled Vital Signs Assessment: post-procedure vital signs reviewed and stable Respiratory status: spontaneous breathing, nonlabored ventilation, respiratory function stable and patient connected to nasal cannula oxygen Cardiovascular status: stable and blood pressure returned to baseline Postop Assessment: no apparent nausea or vomiting Anesthetic complications: no   No notable events documented.  Last Vitals:  Vitals:   07/25/22 0951 07/25/22 1128  BP: (!) 158/89 111/66  Pulse: 90 84  Resp: 17 18  Temp: 36.8 C 36.8 C  SpO2: 100% 100%    Last Pain:  Vitals:   07/25/22 1128  TempSrc: Oral  PainSc:                  [name]

## 2022-07-25 NOTE — Transfer of Care (Signed)
Immediate Anesthesia Transfer of Care Note  Patient: Erin Jackson  Procedure(s) Performed: ESOPHAGOGASTRODUODENOSCOPY (EGD)  Patient Location: PACU  Anesthesia Type:MAC  Level of Consciousness: awake, alert  and oriented  Airway & Oxygen Therapy: Patient Spontanous Breathing  Post-op Assessment: Report given to RN  Post vital signs: Reviewed and stable  Last Vitals:  Vitals Value Taken Time  BP 146/94 07/25/22 0915  Temp    Pulse 103 07/25/22 0916  Resp 22 07/25/22 0916  SpO2 95 % 07/25/22 0916  Vitals shown include unvalidated device data.  Last Pain:  Vitals:   07/25/22 0757  TempSrc:   PainSc: 0-No pain      Patients Stated Pain Goal: 2 (26/83/41 9622)  Complications: No notable events documented.

## 2022-07-26 LAB — BASIC METABOLIC PANEL
Anion gap: 5 (ref 5–15)
BUN: 25 mg/dL — ABNORMAL HIGH (ref 6–20)
CO2: 28 mmol/L (ref 22–32)
Calcium: 9.2 mg/dL (ref 8.9–10.3)
Chloride: 100 mmol/L (ref 98–111)
Creatinine, Ser: 1.16 mg/dL — ABNORMAL HIGH (ref 0.44–1.00)
GFR, Estimated: 55 mL/min — ABNORMAL LOW (ref >60)
Glucose, Bld: 254 mg/dL — ABNORMAL HIGH (ref 70–99)
Potassium: 4.5 mmol/L (ref 3.5–5.1)
Sodium: 133 mmol/L — ABNORMAL LOW (ref 135–145)

## 2022-07-26 LAB — GLUCOSE, CAPILLARY
Glucose-Capillary: 168 mg/dL — ABNORMAL HIGH (ref 70–99)
Glucose-Capillary: 274 mg/dL — ABNORMAL HIGH (ref 70–99)
Glucose-Capillary: 390 mg/dL — ABNORMAL HIGH (ref 70–99)
Glucose-Capillary: 253 mg/dL — ABNORMAL HIGH (ref 70–99)

## 2022-07-26 LAB — CBC
HCT: 33.6 % — ABNORMAL LOW (ref 36.0–46.0)
Hemoglobin: 10.7 g/dL — ABNORMAL LOW (ref 12.0–15.0)
MCH: 28.6 pg (ref 26.0–34.0)
MCHC: 31.8 g/dL (ref 30.0–36.0)
MCV: 89.8 fL (ref 80.0–100.0)
Platelets: 241 10*3/uL (ref 150–400)
RBC: 3.74 MIL/uL — ABNORMAL LOW (ref 3.87–5.11)
RDW: 14.7 % (ref 11.5–15.5)
WBC: 6.8 10*3/uL (ref 4.0–10.5)
nRBC: 0 % (ref 0.0–0.2)

## 2022-07-26 MED ORDER — PREDNISONE 20 MG PO TABS
20.0000 mg | ORAL_TABLET | Freq: Every day | ORAL | Status: DC
  Administered 2022-07-26 – 2022-07-27 (×2): 20 mg via ORAL
  Filled 2022-07-26: qty 1

## 2022-07-26 MED ORDER — HYDRALAZINE HCL 25 MG PO TABS
25.0000 mg | ORAL_TABLET | Freq: Three times a day (TID) | ORAL | Status: DC
  Administered 2022-07-26 – 2022-07-27 (×4): 25 mg via ORAL
  Filled 2022-07-26 (×4): qty 1

## 2022-07-26 MED ORDER — SENNOSIDES-DOCUSATE SODIUM 8.6-50 MG PO TABS
1.0000 | ORAL_TABLET | Freq: Every day | ORAL | Status: DC
  Administered 2022-07-26 – 2022-07-27 (×2): 1 via ORAL
  Filled 2022-07-26 (×2): qty 1

## 2022-07-26 MED ORDER — HYDRALAZINE HCL 50 MG PO TABS: 50.0000 mg | ORAL_TABLET | Freq: Three times a day (TID) | ORAL | Status: DC

## 2022-07-26 NOTE — Progress Notes (Addendum)
[facility] PROGRESS NOTE  Erin Jackson  ZUD:[id number] DOB: Dec 12, 1966 DOA: 07/20/2022 PCP: Jackson, [facility]  Subjective: No overnight events, though continued having elevated blood pressure which is worrisome to her.  Objective: BP (!) 185/118 (BP Location: Left Arm)   Pulse 86   Temp 99.1 F (37.3 C) (Oral)   Resp 19   Ht 5\' 2"  (1.575 m)   Wt 95 kg   SpO2 98%   BMI 38.31 kg/m   Gen: Obese, pleasant female in no distress Pulm: Clear and nonlabored on room air  CV: RRR, no murmur, no JVD, very minimal edema GI: Soft, NT, ND, +BS    Assessment & Plan: Erin Jackson was discharged to SNF yesterday, though for unclear reasons has not been able to go. CSW and Erin Jackson leadership are working on this.  While here, she's remained hypertensive. Since she's hypertensive and hyperglycemic, adrenal insufficiency unlikely, so will further taper prednisone to 20mg  daily. Add hydralazine with hold parameters.   Patient reported constipated stool this morning after evaluation with red blood. On my recheck, she states she had had to bear down significantly and noted red blood when she wiped, if was painful. On exam, there are hemorrhoids that are no longer bleeding. No fissure, abscess, etc. Will check CBC and continue monitoring. Plan is for outpatient colonoscopy at this point. If bleeding continues outside scope of constipation, would indicate more expedited work up.  The patient remains stable for discharge with the above changes, though will not be able to discharge over the weekend.  [facility], MD [facility] [email or web address] 07/26/2022, 12:53 PM

## 2022-07-27 DIAGNOSIS — N179 Acute kidney failure, unspecified: Secondary | ICD-10-CM | POA: Diagnosis not present

## 2022-07-27 DIAGNOSIS — R296 Repeated falls: Secondary | ICD-10-CM | POA: Diagnosis not present

## 2022-07-27 DIAGNOSIS — I3139 Other pericardial effusion (noninflammatory): Secondary | ICD-10-CM | POA: Diagnosis not present

## 2022-07-27 DIAGNOSIS — R2689 Other abnormalities of gait and mobility: Secondary | ICD-10-CM | POA: Diagnosis not present

## 2022-07-27 DIAGNOSIS — E8770 Fluid overload, unspecified: Secondary | ICD-10-CM | POA: Diagnosis not present

## 2022-07-27 DIAGNOSIS — E8809 Other disorders of plasma-protein metabolism, not elsewhere classified: Secondary | ICD-10-CM | POA: Diagnosis not present

## 2022-07-27 DIAGNOSIS — E1169 Type 2 diabetes mellitus with other specified complication: Secondary | ICD-10-CM | POA: Diagnosis not present

## 2022-07-27 DIAGNOSIS — E669 Obesity, unspecified: Secondary | ICD-10-CM | POA: Diagnosis not present

## 2022-07-27 DIAGNOSIS — Z79899 Other long term (current) drug therapy: Secondary | ICD-10-CM | POA: Diagnosis not present

## 2022-07-27 DIAGNOSIS — I444 Left anterior fascicular block: Secondary | ICD-10-CM | POA: Diagnosis not present

## 2022-07-27 DIAGNOSIS — D509 Iron deficiency anemia, unspecified: Secondary | ICD-10-CM | POA: Diagnosis not present

## 2022-07-27 DIAGNOSIS — E785 Hyperlipidemia, unspecified: Secondary | ICD-10-CM | POA: Diagnosis not present

## 2022-07-27 DIAGNOSIS — G8929 Other chronic pain: Secondary | ICD-10-CM | POA: Diagnosis not present

## 2022-07-27 DIAGNOSIS — E1151 Type 2 diabetes mellitus with diabetic peripheral angiopathy without gangrene: Secondary | ICD-10-CM | POA: Diagnosis not present

## 2022-07-27 DIAGNOSIS — E1165 Type 2 diabetes mellitus with hyperglycemia: Secondary | ICD-10-CM | POA: Diagnosis not present

## 2022-07-27 DIAGNOSIS — I5021 Acute systolic (congestive) heart failure: Secondary | ICD-10-CM | POA: Diagnosis not present

## 2022-07-27 DIAGNOSIS — I130 Hypertensive heart and chronic kidney disease with heart failure and stage 1 through stage 4 chronic kidney disease, or unspecified chronic kidney disease: Secondary | ICD-10-CM | POA: Diagnosis not present

## 2022-07-27 DIAGNOSIS — I5022 Chronic systolic (congestive) heart failure: Secondary | ICD-10-CM | POA: Diagnosis not present

## 2022-07-27 DIAGNOSIS — H53132 Sudden visual loss, left eye: Secondary | ICD-10-CM | POA: Diagnosis not present

## 2022-07-27 DIAGNOSIS — M6281 Muscle weakness (generalized): Secondary | ICD-10-CM | POA: Diagnosis not present

## 2022-07-27 DIAGNOSIS — K5900 Constipation, unspecified: Secondary | ICD-10-CM | POA: Diagnosis not present

## 2022-07-27 DIAGNOSIS — F419 Anxiety disorder, unspecified: Secondary | ICD-10-CM | POA: Diagnosis present

## 2022-07-27 DIAGNOSIS — I5033 Acute on chronic diastolic (congestive) heart failure: Secondary | ICD-10-CM | POA: Diagnosis not present

## 2022-07-27 DIAGNOSIS — I2583 Coronary atherosclerosis due to lipid rich plaque: Secondary | ICD-10-CM | POA: Diagnosis not present

## 2022-07-27 DIAGNOSIS — Z91148 Patient's other noncompliance with medication regimen for other reason: Secondary | ICD-10-CM | POA: Diagnosis not present

## 2022-07-27 DIAGNOSIS — H2513 Age-related nuclear cataract, bilateral: Secondary | ICD-10-CM | POA: Diagnosis not present

## 2022-07-27 DIAGNOSIS — M549 Dorsalgia, unspecified: Secondary | ICD-10-CM | POA: Diagnosis not present

## 2022-07-27 DIAGNOSIS — R609 Edema, unspecified: Secondary | ICD-10-CM | POA: Diagnosis not present

## 2022-07-27 DIAGNOSIS — I5043 Acute on chronic combined systolic (congestive) and diastolic (congestive) heart failure: Secondary | ICD-10-CM | POA: Diagnosis not present

## 2022-07-27 DIAGNOSIS — H3412 Central retinal artery occlusion, left eye: Secondary | ICD-10-CM | POA: Diagnosis not present

## 2022-07-27 DIAGNOSIS — N1832 Chronic kidney disease, stage 3b: Secondary | ICD-10-CM | POA: Diagnosis not present

## 2022-07-27 DIAGNOSIS — I429 Cardiomyopathy, unspecified: Secondary | ICD-10-CM | POA: Diagnosis not present

## 2022-07-27 DIAGNOSIS — S82143A Displaced bicondylar fracture of unspecified tibia, initial encounter for closed fracture: Secondary | ICD-10-CM | POA: Diagnosis not present

## 2022-07-27 DIAGNOSIS — I639 Cerebral infarction, unspecified: Secondary | ICD-10-CM | POA: Diagnosis not present

## 2022-07-27 DIAGNOSIS — I10 Essential (primary) hypertension: Secondary | ICD-10-CM | POA: Diagnosis not present

## 2022-07-27 DIAGNOSIS — E7800 Pure hypercholesterolemia, unspecified: Secondary | ICD-10-CM | POA: Diagnosis not present

## 2022-07-27 DIAGNOSIS — S72142S Displaced intertrochanteric fracture of left femur, sequela: Secondary | ICD-10-CM | POA: Diagnosis not present

## 2022-07-27 DIAGNOSIS — I609 Nontraumatic subarachnoid hemorrhage, unspecified: Secondary | ICD-10-CM | POA: Diagnosis not present

## 2022-07-27 DIAGNOSIS — Z794 Long term (current) use of insulin: Secondary | ICD-10-CM | POA: Diagnosis not present

## 2022-07-27 DIAGNOSIS — I739 Peripheral vascular disease, unspecified: Secondary | ICD-10-CM | POA: Diagnosis not present

## 2022-07-27 DIAGNOSIS — E1122 Type 2 diabetes mellitus with diabetic chronic kidney disease: Secondary | ICD-10-CM | POA: Diagnosis not present

## 2022-07-27 DIAGNOSIS — Z8673 Personal history of transient ischemic attack (TIA), and cerebral infarction without residual deficits: Secondary | ICD-10-CM | POA: Diagnosis not present

## 2022-07-27 DIAGNOSIS — R1311 Dysphagia, oral phase: Secondary | ICD-10-CM | POA: Diagnosis not present

## 2022-07-27 DIAGNOSIS — R0602 Shortness of breath: Secondary | ICD-10-CM | POA: Diagnosis not present

## 2022-07-27 DIAGNOSIS — N189 Chronic kidney disease, unspecified: Secondary | ICD-10-CM | POA: Diagnosis not present

## 2022-07-27 DIAGNOSIS — N183 Chronic kidney disease, stage 3 unspecified: Secondary | ICD-10-CM | POA: Diagnosis not present

## 2022-07-27 DIAGNOSIS — E119 Type 2 diabetes mellitus without complications: Secondary | ICD-10-CM | POA: Diagnosis not present

## 2022-07-27 DIAGNOSIS — H5462 Unqualified visual loss, left eye, normal vision right eye: Secondary | ICD-10-CM | POA: Diagnosis not present

## 2022-07-27 DIAGNOSIS — E559 Vitamin D deficiency, unspecified: Secondary | ICD-10-CM | POA: Diagnosis not present

## 2022-07-27 DIAGNOSIS — Z7952 Long term (current) use of systemic steroids: Secondary | ICD-10-CM | POA: Diagnosis not present

## 2022-07-27 DIAGNOSIS — K219 Gastro-esophageal reflux disease without esophagitis: Secondary | ICD-10-CM | POA: Diagnosis not present

## 2022-07-27 DIAGNOSIS — W19XXXA Unspecified fall, initial encounter: Secondary | ICD-10-CM | POA: Diagnosis not present

## 2022-07-27 DIAGNOSIS — R279 Unspecified lack of coordination: Secondary | ICD-10-CM | POA: Diagnosis not present

## 2022-07-27 DIAGNOSIS — R531 Weakness: Secondary | ICD-10-CM | POA: Diagnosis not present

## 2022-07-27 DIAGNOSIS — M545 Low back pain, unspecified: Secondary | ICD-10-CM | POA: Diagnosis not present

## 2022-07-27 DIAGNOSIS — I2510 Atherosclerotic heart disease of native coronary artery without angina pectoris: Secondary | ICD-10-CM | POA: Diagnosis not present

## 2022-07-27 DIAGNOSIS — J449 Chronic obstructive pulmonary disease, unspecified: Secondary | ICD-10-CM | POA: Diagnosis not present

## 2022-07-27 DIAGNOSIS — R252 Cramp and spasm: Secondary | ICD-10-CM | POA: Diagnosis not present

## 2022-07-27 LAB — GLUCOSE, CAPILLARY: Glucose-Capillary: 198 mg/dL — ABNORMAL HIGH (ref 70–99)

## 2022-07-27 MED ORDER — HYDROCHLOROTHIAZIDE 50 MG PO TABS
50.0000 mg | ORAL_TABLET | Freq: Every day | ORAL | Status: DC
  Administered 2022-07-27: 50 mg via ORAL
  Filled 2022-07-27: qty 1

## 2022-07-27 MED ORDER — HYDROCHLOROTHIAZIDE 50 MG PO TABS: 50.0000 mg | ORAL_TABLET | Freq: Every day | ORAL | Status: DC

## 2022-07-27 MED ORDER — HYDRALAZINE HCL 25 MG PO TABS: 25.0000 mg | ORAL_TABLET | Freq: Three times a day (TID) | ORAL | Status: DC

## 2022-07-27 MED ORDER — PREDNISONE 20 MG PO TABS: 20.0000 mg | ORAL_TABLET | Freq: Every day | ORAL | Status: DC

## 2022-07-27 NOTE — Progress Notes (Signed)
Report called to [place] at [facility]. IV's removed and tele discontinued. Patient belongings and lunch sent with patient. Discharge packet sent with [facility] for receiving facility.  Melony Overly, RN

## 2022-07-27 NOTE — Care Management Important Message (Signed)
Important Message  Patient Details  Name: Erin Jackson MRN: [id number] Date of Birth: 1966/10/18   Medicare Important Message Given:  Yes     Dorena Bodo 07/27/2022, 11:17 AM

## 2022-07-27 NOTE — TOC Transition Note (Signed)
Transition of Care [facility]) - CM/SW Discharge Note   Patient Details  Name: Erin Jackson MRN: [id number] Date of Birth: [birth year]  [facility] CM/SW Contact:  Baldemar Lenis, LCSW Phone Number: 07/27/2022, 10:34 AM   Clinical Narrative:   CSW confirmed with Faythe Casa that patient can admit today, sent updated discharge summary. Transport scheduled with [facility] for 11:00.  Nurse to call report to [phone].    Final next level of care: Skilled Nursing Facility Barriers to Discharge: Barriers Resolved   Patient Goals and CMS Choice Patient states their goals for this hospitalization and ongoing recovery are:: to get better CMS Medicare.gov Compare Post Acute Care list provided to:: Patient Choice offered to / list presented to : Patient  Discharge Placement              Patient chooses bed at:  [facility]) Patient to be transferred to facility by: [facility] Name of family member notified: Self Patient and family notified of of transfer: 07/27/22  Discharge Plan and Services     Post Acute Care Choice: Skilled Nursing Facility                               Social Determinants of Health (SDOH) Interventions     Readmission Risk Interventions     No data to display

## 2022-07-27 NOTE — Discharge Summary (Signed)
Physician Discharge Summary   Patient: Erin Jackson MRN: [id number] DOB: 15-Jul-1966  Admit date:     07/20/2022  Discharge date: 07/27/22  Discharge Physician: Patrecia Pour   PCP: [facility]   Recommendations at discharge:  Follow up gastric biopsy performed at EGD 8/5. GI follow up for colonoscopy as well (due for screening and has otherwise unexplained iron deficiency). Monitoring CBC, BMP, and vitamin D levels recommended. Follow up with ophthalmology for left eye blindness.  Continue prednisone taper (was prescribed since June for possible temporal arteritis, though biopsy was negative).   Discharge Diagnoses: Principal Problem:   Hypocalcemia Active Problems:   Primary hypertension   T2DM (type 2 diabetes mellitus) (HCC)   GERD (gastroesophageal reflux disease)   CAD (coronary artery disease)   PVD (peripheral vascular disease) (HCC)   Stage 3b chronic kidney disease ([place])   Chronic systolic heart failure (HCC)   Vision loss   Iron deficiency anemia   History of CVA (cerebrovascular accident)   Debility   Fall at home   Hypoalbuminemia   Obesity (BMI 30-39.9)  Hospital Course: Per H&P: Erin Jackson is a 56 y.o. female with medical history significant of IIDM, HTN, anxiety/depression, chronic vitamin D deficiency, presented with new onset of bilateral arms and fingers numbness and cramping and bilateral calf cramping.   Patient has diagnosed vitamin D deficiency however has not been not compliant with vitamin D supplement.  Yesterday evening, after dinner, patient developed bilateral forearm and hands and all 10 fingers numbness and overnight she developed cramping pain of bilateral forearm and hands.  This morning, she woke up and symptoms of normal hands numbness and cramping persisted, and when she also developed bilateral calf cramping. "Thinking of I might have a stroke".  No chest pain no abdominal pain.   Patient also reported productive cough with  whitish phlegm for the last 2 to 3 days, no fever or chills.  At baseline, patient has had episodes of coughing and choking after eating solid food.  No history of aspiration denies any fever or chills.   ED Course: Blood pressure elevated.  Severe hypocalcemia, CA 5.4, corrected Ca 6.0.  CT head negative for acute finding but only chronic lacunar stroke.  Patient received IV calcium.  Problem-based hospital course: Severe hypocalcemia, vitamin D deficiency: PTH is wnl indicating non-parathyroid-related etiology. Calcium corrects to normal limits as of 8/3, symptoms resolved. - Restarted home oral calcium supplement, giving vitamin D 50k units weekly, recheck in 8-12 weeks. Level on 7/31 was 25.  - Calcium level is durably normalized. - Continue oral supplementation.   - Recheck labs in next week.   Bilateral carpal spasm: CT without stroke, no other focal deficits and this bilateral symptom is waxing/waning, suspected to be due to hypocalcemia.  - Resolved. Added antispasmodic with improvement. Does have history of CTS though this does not fit that symptomatology.   Hypomagnesemia: Improved with supplementation.    Aspiration pneumonia: Reports dysphagia and odynophagia at times as well as cough. CXR with RLL infiltrate.  - Completed augmentin   Dysphagia, odynophagia: For solids > liquids. Esophagram showed narrowing at distal esophagus. 76mm barium tablet was held up at that site. GI consult appreciated, added carafate, EGD on 8/5 by Dr. Lyndel Safe showed a small hiatal hernia, gastritis (which was biopsied), and no esophageal stricture. - Continue SLP at SNF   Gastritis: Pending biopsy, suspect steroid-induced - Continue PPI increased to BID while on steroids, changed to protonix due to  plavix (avoiding omeprazole)  - Carafate x5 days  Left leg swelling, pain: No cellulitis noted. Venous U/S negative. No obvious source of pain, though may be muscle spasm in relation to  metabolic/electrolyte fluctuations. - Continue monitoring and supportive care. Swelling has been related to prednisone and is improving.   Left eye vision loss: Stroke ruled out during recent admission. Empirically continued on steroids due to elevated inflammatory marker (ESR 69, CRP undetectable), though temporal artery biopsy from 6/13 revealed no specific histopathologic changes, specifically negative for inflammation, giant cells, granulomas, or fragmentation of elastic lamina.  - Temporal arteritis having been ruled out, we've started tapering prednisone 60mg  >>> now down to 20mg  as of 8/7. Will need a prolonged taper since she's been on this for 7 weeks. - Recommend outpatient ophthalmology follow up    History of CVA:  - Continue plavix, statin.  - Pt believes her symptoms since this event, including left eye blindness, have made her unsafe to be home alone, we are pursuing placement for continued rehab at this time.   IDT2DM: HbA1c 6.8%, with steroid-induced hyperglycemia - Patient has been resistant to dietary counseling. These include pot pies and [employer] fried chicken despite being placed on carb-modified diet. Hyperglycemic excursions have occurred as expected postprandially. Recommend continuing levemir 20u daily and sliding scale insulin as well as ongoing dietary counseling. Anticipate need to decrease basal insulin as steroid taper continues.     Debility, falls:  - PT/OT recommending SNF rehabilitation.    PVD, CAD:  - Continue plavix and statin   HTN with HTN urgency developing 8/3: Suspect worsened by significant salt loads due to diet. No evidence of end-organ damage at this time. BP overall improved, not severe. - Continue clonidine and losartan - Started metoprolol given her CHF history - Started HCTZ which should also help with dependent steroid-induced edema and hypocalcemia, continue this at discharge.  - Started hydralazine    Chronic combined HFrEF: LVEF 40-45%,  global hypokinesis, G2DD.  - Appears euvolemic on day of discharge. - Continue ARB. Not on coreg or metoprolol previously. With elevated BP, started metoprolol. Will consolidate to succinate at discharge.   Stage IIIb CKD: CrCl worsened initially, now better. - Monitor Cr closely, avoiding nephrotoxins.    Iron deficiency anemia: Mild, normocytic with replete iron stores based on ferritin (105). Suspect a component of anemia of CKD as well. - Continue supplementation - Iron deficiency not otherwise explained and age indicate need for colonoscopy, though this is not urgent.   Hypoalbuminemia:  - Dietitian consulted    Obesity: Estimated body mass index is 38.31 kg/m as calculated from the following:   Height as of this encounter: 5\' 2"  (1.575 m).   Weight as of this encounter: 95 kg.  Consultants: GI Procedures performed:  EGD 07/25/2022 Dr. Lyndel Safe:  Impression:        - Small hiatal hernia.                           - Gastritis. Biopsied.                           - No esophageal strictures. We decided to hold off on empiric esophageal dilatation since patient is on Plavix. Recommendation:- Return patient to hospital ward for ongoing care.                           -  Resume previous diet.                           - Continue present medications including Protonix 40 mg p.o. daily.                           - Await pathology results.                           - Return to GI clinic in 4-6 weeks. If still with                            problems, further work-up.                           - The findings and recommendations were discussed with the patient.                           - Will sign off for now.  Disposition: Skilled nursing facility Diet recommendation:  Cardiac and Carb modified diet DISCHARGE MEDICATION: Allergies as of 07/27/2022       Reactions   Cucumber Extract Anaphylaxis   Molds & Smuts Shortness Of Breath   Norvasc [amlodipine] Other (See Comments)   Muscle  became very tight   Other Anaphylaxis, Other (See Comments)   Tree nuts, walnuts, peanuts, pickles   Apple Juice Hives   Aspirin Rash   Ibuprofen Rash   Niacin And Related Rash        Medication List     STOP taking these medications    allopurinol 100 MG tablet Commonly known as: ZYLOPRIM   hydrOXYzine 25 MG tablet Commonly known as: ATARAX   meclizine 12.5 MG tablet Commonly known as: ANTIVERT   methocarbamol 500 MG tablet Commonly known as: ROBAXIN   omeprazole 40 MG capsule Commonly known as: PRILOSEC Replaced by: pantoprazole 40 MG tablet   prochlorperazine 5 MG tablet Commonly known as: COMPAZINE       TAKE these medications    acetaminophen 325 MG tablet Commonly known as: TYLENOL Take 2 tablets (650 mg total) by mouth every 4 (four) hours as needed for fever or mild pain.   alprazolam 2 MG tablet Commonly known as: XANAX Take 0.5 tablets (1 mg total) by mouth 2 (two) times daily. What changed: how much to take   atorvastatin 80 MG tablet Commonly known as: LIPITOR Take 1 tablet (80 mg total) by mouth daily. What changed: when to take this   calcium carbonate 1250 (500 Ca) MG tablet Commonly known as: OS-CAL - dosed in mg of elemental calcium Take 1 tablet (1,250 mg total) by mouth 2 (two) times daily with a meal.   cetirizine 10 MG tablet Commonly known as: ZyrTEC Allergy Take 1 tablet (10 mg total) by mouth daily. What changed:  when to take this reasons to take this   cloNIDine 0.1 MG tablet Commonly known as: CATAPRES Take 2 tablets (0.2 mg total) by mouth 2 (two) times daily.   clopidogrel 75 MG tablet Commonly known as: PLAVIX Take 1 tablet (75 mg total) by mouth daily.   EPINEPHrine 0.3 mg/0.3 mL Soaj injection Commonly known as: EPI-PEN Inject 0.3 mLs (0.3 mg total) into the muscle once as needed (allergic reaction). What  changed:  when to take this reasons to take this   ferrous gluconate 324 MG tablet Commonly known as:  FERGON Take 324 mg by mouth every other day.   hydrALAZINE 25 MG tablet Commonly known as: APRESOLINE Take 1 tablet (25 mg total) by mouth every 8 (eight) hours.   hydrochlorothiazide 50 MG tablet Commonly known as: HYDRODIURIL Take 1 tablet (50 mg total) by mouth daily.   HYDROcodone-acetaminophen 10-325 MG tablet Commonly known as: NORCO Take 0.5-1 tablets by mouth 3 (three) times daily as needed for moderate pain or severe pain. What changed:  how much to take reasons to take this   insulin lispro 100 UNIT/ML injection Commonly known as: HUMALOG Inject 2-12 Units into the skin 2 (two) times daily. Per sliding scale, if 70 to 200= 0 units, 201 to 250= 2 units, 251 to 300= 4 units 301 to 350= 6 units, 351 to 400= 8 units, 401 to 450= 10 units, 451 to 600= 12 units   Levemir FlexPen 100 UNIT/ML FlexPen Generic drug: insulin detemir Inject 20 Units into the skin at bedtime.   losartan 100 MG tablet Commonly known as: COZAAR Take 1 tablet (100 mg total) by mouth daily. What changed: how much to take   metFORMIN 500 MG tablet Commonly known as: GLUCOPHAGE Take 1 tablet (500 mg total) by mouth 2 (two) times daily with a meal.   metoprolol succinate 25 MG 24 hr tablet Commonly known as: Toprol XL Take 1 tablet (25 mg total) by mouth daily.   Narcan 4 MG/0.1ML Liqd nasal spray kit Generic drug: naloxone Place 0.4 mg into the nose as needed (opoid overdose).   nitroGLYCERIN 0.4 MG SL tablet Commonly known as: NITROSTAT Place 1 tablet (0.4 mg total) under the tongue every 5 (five) minutes as needed for chest pain.   ondansetron 4 MG tablet Commonly known as: ZOFRAN Take 1 tablet (4 mg total) by mouth every 8 (eight) hours as needed for nausea or vomiting. What changed:  when to take this reasons to take this   OneTouch Delica Plus [id number] Misc Apply topically daily.   OneTouch Verio test strip Generic drug: glucose blood daily.   pantoprazole 40 MG  tablet Commonly known as: PROTONIX Take 1 tablet (40 mg total) by mouth 2 (two) times daily before a meal. Replaces: omeprazole 40 MG capsule   predniSONE 20 MG tablet Commonly known as: DELTASONE Take 1 tablet (20 mg total) by mouth daily. What changed: how much to take   sucralfate 1 g tablet Commonly known as: CARAFATE Take 1 tablet (1 g total) by mouth 3 (three) times daily before meals for 5 days.   traZODone 150 MG tablet Commonly known as: DESYREL Take 1 tablet (150 mg total) by mouth at bedtime.   Vitamin D (Ergocalciferol) 1.25 MG (50000 UNIT) Caps capsule Commonly known as: DRISDOL Take 1 capsule (50,000 Units total) by mouth every 7 (seven) days.        Contact information for follow-up providers     [facility]. Schedule an appointment as soon as possible for a visit.   Contact information: [place] [address] [phone]         Jackquline Denmark, MD Follow up.   Specialties: Gastroenterology, Internal Medicine Why: to follow up gastric biopsy from 8/5 and arrange colonoscopy Contact information: [address] [place]. [place] [address] [phone]         Alroy Dust, MD. Schedule an appointment as soon as  possible for a visit.   Why: Ophthalmology follow up as soon as available post discharge Contact information: [address] [place] [address] [phone]              Contact information for after-discharge care     Destination     [facility] Preferred SNF .   Service: Skilled Nursing Contact information: [place] [phone]                    Discharge Exam: Danley Danker Weights   07/20/22 2231  Weight: 95 kg  BP (!) 159/96   Pulse (!) 111   Temp 98 F (36.7 C)   Resp 17   Ht $R'5\' 2"'yb$  (1.575 m)   Wt 95 kg   SpO2 100%   BMI 38.31 kg/m   Well-appearing obese female in no distress Clear, nonlabored on  room air RRR without MRG. Edema improved in legs, no deformities noted. Alert, oriented, stable left eye visual loss, decreased right side acuity grossly, no new focal deficits, ambulated with walker.   Condition at discharge: stable  The results of significant diagnostics from this hospitalization (including imaging, microbiology, ancillary and laboratory) are listed below for reference.   Imaging Studies: VAS [place] LOWER EXTREMITY VENOUS (DVT)  Result Date: 07/23/2022  Lower Venous DVT Study Patient Name:  ROSAMAE ROCQUE  Date of Exam:   07/23/2022 Medical Rec #: [id number]       Accession #:    [id number] Date of Birth: Apr 27, 1966       Patient Gender: F Patient Age:   55 years Exam Location:  [facility] Procedure:      VAS [place] LOWER EXTREMITY VENOUS (DVT) Referring Phys: Vance Gather --------------------------------------------------------------------------------  Indications: Edema.  Comparison Study: 04/09/22, negative for DVT. Performing Technologist: Bobetta Lime BS, RVT  Examination Guidelines: A complete evaluation includes B-mode imaging, spectral Doppler, color Doppler, and power Doppler as needed of all accessible portions of each vessel. Bilateral testing is considered an integral part of a complete examination. Limited examinations for reoccurring indications may be performed as noted. The reflux portion of the exam is performed with the patient in reverse Trendelenburg.  +---------+---------------+---------+-----------+----------+--------------+ RIGHT    CompressibilityPhasicitySpontaneityPropertiesThrombus Aging +---------+---------------+---------+-----------+----------+--------------+ CFV      Full           Yes      Yes                                 +---------+---------------+---------+-----------+----------+--------------+ SFJ      Full                                                         +---------+---------------+---------+-----------+----------+--------------+ FV Prox  Full                                                        +---------+---------------+---------+-----------+----------+--------------+ FV Mid   Full                                                        +---------+---------------+---------+-----------+----------+--------------+  FV DistalFull                                                        +---------+---------------+---------+-----------+----------+--------------+ PFV      Full                                                        +---------+---------------+---------+-----------+----------+--------------+ POP      Full           Yes      Yes                                 +---------+---------------+---------+-----------+----------+--------------+ PTV      Full                                                        +---------+---------------+---------+-----------+----------+--------------+ PERO     Full                                                        +---------+---------------+---------+-----------+----------+--------------+   +---------+---------------+---------+-----------+----------+--------------+ LEFT     CompressibilityPhasicitySpontaneityPropertiesThrombus Aging +---------+---------------+---------+-----------+----------+--------------+ CFV      Full           Yes      Yes                                 +---------+---------------+---------+-----------+----------+--------------+ SFJ      Full                                                        +---------+---------------+---------+-----------+----------+--------------+ FV Prox  Full                                                        +---------+---------------+---------+-----------+----------+--------------+ FV Mid   Full                                                         +---------+---------------+---------+-----------+----------+--------------+ FV DistalFull                                                        +---------+---------------+---------+-----------+----------+--------------+  PFV      Full                                                        +---------+---------------+---------+-----------+----------+--------------+ POP      Full           Yes      Yes                                 +---------+---------------+---------+-----------+----------+--------------+ PTV      Full                                                        +---------+---------------+---------+-----------+----------+--------------+ PERO     Full                                                        +---------+---------------+---------+-----------+----------+--------------+     Summary: BILATERAL: - No evidence of deep vein thrombosis seen in the lower extremities, bilaterally. -No evidence of popliteal cyst, bilaterally.   *See table(s) above for measurements and observations. Electronically signed by Jamelle Haring on 07/23/2022 at 4:36:22 PM.    Final    DG ESOPHAGUS W SINGLE CM (SOL OR THIN BA)  Result Date: 07/22/2022 CLINICAL DATA:  56 year old female with history of GERD who presents with dysphagia as well as concern for aspiration pneumonia. Patient has no UGI related past surgical history. Speech evaluation yesterday did not reveal any site of aspiration. Request for single-contrast esophagram for further evaluation. EXAM: ESOPHAGUS/BARIUM SWALLOW/TABLET STUDY TECHNIQUE: Single contrast examination was performed using thin liquid barium. This exam was performed by Durenda Guthrie, PA-C, and was supervised and interpreted by Macy Mis, MD. Exam was limited due to patient's immobility. FLUOROSCOPY: Radiation Exposure Index (as provided by the fluoroscopic device): 17.6 mGy Kerma COMPARISON:  None Available. FINDINGS: Swallowing: Appears normal. No vestibular  penetration or aspiration seen on this limited exam. Pharynx: Unremarkable. Esophagus: No mass. Question of mild relative narrowing at the gastroesophageal junction. Esophageal motility: Within normal limits. Hiatal Hernia: None. Gastroesophageal reflux: None visualized. Ingested 34mm barium tablet: Held up at the gastroesophageal junction. Did not pass during the course of study. Other: None. IMPRESSION: Question of mild relative narrowing at the gastroesophageal junction without obstruction to passage of contrast. The barium tablet was held up at the junction and did not pass during course of study. Electronically Signed   By: Macy Mis M.D.   On: 07/22/2022 12:36   DG Chest 2 View  Result Date: 07/20/2022 CLINICAL DATA:  Cough. EXAM: CHEST - 2 VIEW COMPARISON:  Chest x-ray 06/19/2022 FINDINGS: Lung volumes are low. There are some patchy opacities in the right lower lobe. There is stable scarring in the left mid lung. The heart is mildly enlarged, unchanged. There is no pleural effusion or pneumothorax. No acute fractures are seen. IMPRESSION: 1. Right lower lobe atelectasis/airspace disease. 2. Stable cardiomegaly. Electronically Signed   By: Warren Lacy  Dagoberto Reef M.D.   On: 07/20/2022 16:07   CT HEAD WO CONTRAST  Result Date: 07/20/2022 CLINICAL DATA:  Patient complains of awakening with weakness this morning. EXAM: CT HEAD WITHOUT CONTRAST TECHNIQUE: Contiguous axial images were obtained from the base of the skull through the vertex without intravenous contrast. RADIATION DOSE REDUCTION: This exam was performed according to the departmental dose-optimization program which includes automated exposure control, adjustment of the mA and/or kV according to patient size and/or use of iterative reconstruction technique. COMPARISON:  06/15/2022 FINDINGS: Brain: No evidence of acute infarction, hemorrhage, hydrocephalus, extra-axial collection or mass lesion/mass effect. Chronic lacunar infarct within the left  basal ganglia and right-side of pons identified. Vascular: No hyperdense vessel or unexpected calcification. Skull: Normal. Negative for fracture or focal lesion. Sinuses/Orbits: No acute finding. Other: None IMPRESSION: 1. No acute intracranial abnormalities. 2. Chronic lacunar infarcts within the left basal ganglia and right-side of pons. Electronically Signed   By: Kerby Moors M.D.   On: 07/20/2022 14:43    Microbiology: Results for orders placed or performed during the hospital encounter of 07/20/22  Resp Panel by RT-PCR (Flu A&B, Covid) Anterior Nasal Swab     Status: None   Collection Time: 07/20/22  3:43 PM   Specimen: Anterior Nasal Swab  Result Value Ref Range Status   SARS Coronavirus 2 by RT PCR NEGATIVE NEGATIVE Final    Comment: (NOTE) SARS-CoV-2 target nucleic acids are NOT DETECTED.  The SARS-CoV-2 RNA is generally detectable in upper respiratory specimens during the acute phase of infection. The lowest concentration of SARS-CoV-2 viral copies this assay can detect is 138 copies/mL. A negative result does not preclude SARS-Cov-2 infection and should not be used as the sole basis for treatment or other patient management decisions. A negative result may occur with  improper specimen collection/handling, submission of specimen other than nasopharyngeal swab, presence of viral mutation(s) within the areas targeted by this assay, and inadequate number of viral copies(<138 copies/mL). A negative result must be combined with clinical observations, patient history, and epidemiological information. The expected result is Negative.  Fact Sheet for Patients:  EntrepreneurPulse.com.au  Fact Sheet for Healthcare Providers:  IncredibleEmployment.be  This test is no t yet approved or cleared by the [place] FDA and  has been authorized for detection and/or diagnosis of SARS-CoV-2 by FDA under an Emergency Use Authorization (EUA). This EUA  will remain  in effect (meaning this test can be used) for the duration of the COVID-19 declaration under Section 564(b)(1) of the Act, 21 U.S.C.section 360bbb-3(b)(1), unless the authorization is terminated  or revoked sooner.       Influenza A by PCR NEGATIVE NEGATIVE Final   Influenza B by PCR NEGATIVE NEGATIVE Final    Comment: (NOTE) The Xpert Xpress SARS-CoV-2/FLU/RSV plus assay is intended as an aid in the diagnosis of influenza from Nasopharyngeal swab specimens and should not be used as a sole basis for treatment. Nasal washings and aspirates are unacceptable for Xpert Xpress SARS-CoV-2/FLU/RSV testing.  Fact Sheet for Patients: EntrepreneurPulse.com.au  Fact Sheet for Healthcare Providers: IncredibleEmployment.be  This test is not yet approved or cleared by the [place] FDA and has been authorized for detection and/or diagnosis of SARS-CoV-2 by FDA under an Emergency Use Authorization (EUA). This EUA will remain in effect (meaning this test can be used) for the duration of the COVID-19 declaration under Section 564(b)(1) of the Act, 21 U.S.C. section 360bbb-3(b)(1), unless the authorization is terminated or revoked.  Performed at [facility]  Lab, [address], [place], [place] [id number]   Culture, blood (routine x 2)     Status: None   Collection Time: 07/20/22  6:20 PM   Specimen: BLOOD  Result Value Ref Range Status   Specimen Description BLOOD RIGHT ANTECUBITAL  Final   Special Requests   Final    BOTTLES DRAWN AEROBIC AND ANAEROBIC Blood Culture results may not be optimal due to an inadequate volume of blood received in culture bottles   Culture   Final    NO GROWTH 5 DAYS Performed at [facility], [place] [address] [place], [place] [address]    Report Status 07/25/2022 FINAL  Final  Culture, blood (routine x 2)     Status: None   Collection Time: 07/20/22  6:45 PM   Specimen: BLOOD  Result Value Ref  Range Status   Specimen Description BLOOD BLOOD RIGHT HAND  Final   Special Requests   Final    BOTTLES DRAWN AEROBIC ONLY Blood Culture results may not be optimal due to an inadequate volume of blood received in culture bottles   Culture   Final    NO GROWTH 5 DAYS Performed at [facility], [place] [address] [place], [place] [address]    Report Status 07/25/2022 FINAL  Final    Labs: CBC: Recent Labs  Lab 07/20/22 1307 07/20/22 2112 07/21/22 0256 07/24/22 0609 07/26/22 1151  WBC 10.6*  --  7.3 8.0 6.8  NEUTROABS 9.2*  --   --   --   --   HGB 11.1* 11.6* 10.7* 9.7* 10.7*  HCT 35.7* 34.0* 34.0* 30.7* 33.6*  MCV 90.8  --  89.7 89.8 89.8  PLT 228  --  227 225 010   Basic Metabolic Panel: Recent Labs  Lab 07/20/22 1216 07/20/22 1307 07/21/22 0256 07/21/22 1038 07/21/22 1801 07/22/22 0708 07/23/22 0556 07/24/22 0609 07/26/22 1151  NA  --    < > 138 136 133* 136 136 135 133*  K  --    < > 4.2 3.7 3.5 3.8 4.7 4.9 4.5  CL  --    < > 103 102 102 103 104 104 100  CO2  --    < > $R'24 25 25 25 25 25 28  'LT$ GLUCOSE  --    < > 206* 142* 256* 133* 175* 224* 254*  BUN  --    < > 19 21* 22* 19 19 21* 25*  CREATININE  --    < > 1.36* 1.46* 1.54* 1.34* 1.09* 1.13* 1.16*  CALCIUM 5.3*   < > 5.6* 6.2* 7.1* 7.3* 8.0* 8.9 9.2  MG <0.5*  --  1.3* 1.5*  --   --   --   --   --   PHOS 4.2  --   --   --   --   --   --   --   --    < > = values in this interval not displayed.   Liver Function Tests: Recent Labs  Lab 07/21/22 0256 07/21/22 1038 07/21/22 1801 07/22/22 0708 07/23/22 0556  AST $Re'17 17 16 'Ssu$ 14* 17  ALT $Re'16 12 12 11 19  'WXm$ ALKPHOS 65 58 72 55 65  BILITOT 1.3* 0.7 0.5 0.5 0.5  PROT 5.9* 5.1* 5.2* 5.5* 5.4*  ALBUMIN 2.6* 2.2* 2.2* 2.3* 2.1*   CBG: Recent Labs  Lab 07/26/22 0655 07/26/22 1159 07/26/22 1606 07/26/22 2136 07/27/22 0636  GLUCAP 168* 274* 390* 253* 198*  Discharge time spent: greater than 30 minutes.  Signed: Patrecia Pour, MD [facility] 07/27/2022

## 2022-07-28 ENCOUNTER — Encounter: Payer: Medicare Other | Attending: Physical Medicine & Rehabilitation | Admitting: Physical Medicine and Rehabilitation

## 2022-07-28 DIAGNOSIS — H5440 Blindness, one eye, unspecified eye: Secondary | ICD-10-CM | POA: Insufficient documentation

## 2022-07-28 DIAGNOSIS — F411 Generalized anxiety disorder: Secondary | ICD-10-CM | POA: Insufficient documentation

## 2022-07-28 LAB — SURGICAL PATHOLOGY

## 2022-07-30 DIAGNOSIS — E119 Type 2 diabetes mellitus without complications: Secondary | ICD-10-CM | POA: Diagnosis not present

## 2022-07-30 DIAGNOSIS — I2510 Atherosclerotic heart disease of native coronary artery without angina pectoris: Secondary | ICD-10-CM | POA: Diagnosis not present

## 2022-07-30 DIAGNOSIS — W19XXXA Unspecified fall, initial encounter: Secondary | ICD-10-CM | POA: Diagnosis not present

## 2022-07-30 DIAGNOSIS — I639 Cerebral infarction, unspecified: Secondary | ICD-10-CM | POA: Diagnosis not present

## 2022-07-30 DIAGNOSIS — I429 Cardiomyopathy, unspecified: Secondary | ICD-10-CM | POA: Diagnosis not present

## 2022-07-30 DIAGNOSIS — N189 Chronic kidney disease, unspecified: Secondary | ICD-10-CM | POA: Diagnosis not present

## 2022-08-04 DIAGNOSIS — Z91148 Patient's other noncompliance with medication regimen for other reason: Secondary | ICD-10-CM | POA: Diagnosis not present

## 2022-08-04 DIAGNOSIS — R609 Edema, unspecified: Secondary | ICD-10-CM | POA: Diagnosis not present

## 2022-08-04 DIAGNOSIS — K5900 Constipation, unspecified: Secondary | ICD-10-CM | POA: Diagnosis not present

## 2022-08-05 DIAGNOSIS — H2513 Age-related nuclear cataract, bilateral: Secondary | ICD-10-CM | POA: Diagnosis not present

## 2022-08-05 DIAGNOSIS — H3412 Central retinal artery occlusion, left eye: Secondary | ICD-10-CM | POA: Diagnosis not present

## 2022-08-05 DIAGNOSIS — E119 Type 2 diabetes mellitus without complications: Secondary | ICD-10-CM | POA: Diagnosis not present

## 2022-08-07 DIAGNOSIS — E119 Type 2 diabetes mellitus without complications: Secondary | ICD-10-CM | POA: Diagnosis not present

## 2022-08-12 DIAGNOSIS — R252 Cramp and spasm: Secondary | ICD-10-CM | POA: Diagnosis not present

## 2022-08-12 DIAGNOSIS — R609 Edema, unspecified: Secondary | ICD-10-CM | POA: Diagnosis not present

## 2022-08-12 DIAGNOSIS — Z7952 Long term (current) use of systemic steroids: Secondary | ICD-10-CM | POA: Diagnosis not present

## 2022-08-12 DIAGNOSIS — K5900 Constipation, unspecified: Secondary | ICD-10-CM | POA: Diagnosis not present

## 2022-08-13 ENCOUNTER — Inpatient Hospital Stay (HOSPITAL_COMMUNITY)
Admission: EM | Admit: 2022-08-13 | Discharge: 2022-08-22 | DRG: 291 | Disposition: A | Discharge status: Skilled Nursing Facility | Payer: Medicare Other | Source: Skilled Nursing Facility | Attending: Internal Medicine | Admitting: Internal Medicine

## 2022-08-13 ENCOUNTER — Encounter (HOSPITAL_COMMUNITY): Payer: Self-pay | Admitting: Emergency Medicine

## 2022-08-13 ENCOUNTER — Other Ambulatory Visit: Payer: Self-pay

## 2022-08-13 ENCOUNTER — Emergency Department (HOSPITAL_COMMUNITY): Payer: Medicare Other

## 2022-08-13 DIAGNOSIS — Z79891 Long term (current) use of opiate analgesic: Secondary | ICD-10-CM

## 2022-08-13 DIAGNOSIS — M549 Dorsalgia, unspecified: Secondary | ICD-10-CM | POA: Diagnosis present

## 2022-08-13 DIAGNOSIS — Z8673 Personal history of transient ischemic attack (TIA), and cerebral infarction without residual deficits: Secondary | ICD-10-CM | POA: Diagnosis not present

## 2022-08-13 DIAGNOSIS — H5462 Unqualified visual loss, left eye, normal vision right eye: Secondary | ICD-10-CM | POA: Diagnosis not present

## 2022-08-13 DIAGNOSIS — E8770 Fluid overload, unspecified: Secondary | ICD-10-CM | POA: Diagnosis not present

## 2022-08-13 DIAGNOSIS — E66812 Obesity, class 2: Secondary | ICD-10-CM | POA: Diagnosis present

## 2022-08-13 DIAGNOSIS — E559 Vitamin D deficiency, unspecified: Secondary | ICD-10-CM | POA: Diagnosis not present

## 2022-08-13 DIAGNOSIS — E8809 Other disorders of plasma-protein metabolism, not elsewhere classified: Secondary | ICD-10-CM | POA: Diagnosis present

## 2022-08-13 DIAGNOSIS — I3139 Other pericardial effusion (noninflammatory): Secondary | ICD-10-CM | POA: Diagnosis present

## 2022-08-13 DIAGNOSIS — Z79899 Other long term (current) drug therapy: Secondary | ICD-10-CM

## 2022-08-13 DIAGNOSIS — I5033 Acute on chronic diastolic (congestive) heart failure: Secondary | ICD-10-CM | POA: Diagnosis not present

## 2022-08-13 DIAGNOSIS — K219 Gastro-esophageal reflux disease without esophagitis: Secondary | ICD-10-CM | POA: Diagnosis present

## 2022-08-13 DIAGNOSIS — E785 Hyperlipidemia, unspecified: Secondary | ICD-10-CM | POA: Diagnosis present

## 2022-08-13 DIAGNOSIS — Z87891 Personal history of nicotine dependence: Secondary | ICD-10-CM

## 2022-08-13 DIAGNOSIS — E1151 Type 2 diabetes mellitus with diabetic peripheral angiopathy without gangrene: Secondary | ICD-10-CM | POA: Diagnosis present

## 2022-08-13 DIAGNOSIS — I2583 Coronary atherosclerosis due to lipid rich plaque: Secondary | ICD-10-CM

## 2022-08-13 DIAGNOSIS — Z886 Allergy status to analgesic agent status: Secondary | ICD-10-CM

## 2022-08-13 DIAGNOSIS — I5021 Acute systolic (congestive) heart failure: Secondary | ICD-10-CM | POA: Diagnosis not present

## 2022-08-13 DIAGNOSIS — E669 Obesity, unspecified: Secondary | ICD-10-CM | POA: Diagnosis present

## 2022-08-13 DIAGNOSIS — J449 Chronic obstructive pulmonary disease, unspecified: Secondary | ICD-10-CM | POA: Diagnosis not present

## 2022-08-13 DIAGNOSIS — Z7952 Long term (current) use of systemic steroids: Secondary | ICD-10-CM

## 2022-08-13 DIAGNOSIS — I429 Cardiomyopathy, unspecified: Secondary | ICD-10-CM | POA: Diagnosis not present

## 2022-08-13 DIAGNOSIS — K449 Diaphragmatic hernia without obstruction or gangrene: Secondary | ICD-10-CM | POA: Diagnosis present

## 2022-08-13 DIAGNOSIS — I444 Left anterior fascicular block: Secondary | ICD-10-CM | POA: Diagnosis present

## 2022-08-13 DIAGNOSIS — Z7984 Long term (current) use of oral hypoglycemic drugs: Secondary | ICD-10-CM

## 2022-08-13 DIAGNOSIS — Z888 Allergy status to other drugs, medicaments and biological substances status: Secondary | ICD-10-CM

## 2022-08-13 DIAGNOSIS — I10 Essential (primary) hypertension: Secondary | ICD-10-CM | POA: Diagnosis not present

## 2022-08-13 DIAGNOSIS — N1832 Chronic kidney disease, stage 3b: Secondary | ICD-10-CM | POA: Diagnosis not present

## 2022-08-13 DIAGNOSIS — I2510 Atherosclerotic heart disease of native coronary artery without angina pectoris: Secondary | ICD-10-CM | POA: Diagnosis present

## 2022-08-13 DIAGNOSIS — F419 Anxiety disorder, unspecified: Secondary | ICD-10-CM | POA: Diagnosis not present

## 2022-08-13 DIAGNOSIS — Z6839 Body mass index (BMI) 39.0-39.9, adult: Secondary | ICD-10-CM

## 2022-08-13 DIAGNOSIS — E1169 Type 2 diabetes mellitus with other specified complication: Secondary | ICD-10-CM | POA: Diagnosis present

## 2022-08-13 DIAGNOSIS — I5043 Acute on chronic combined systolic (congestive) and diastolic (congestive) heart failure: Secondary | ICD-10-CM | POA: Diagnosis not present

## 2022-08-13 DIAGNOSIS — Z794 Long term (current) use of insulin: Secondary | ICD-10-CM | POA: Diagnosis not present

## 2022-08-13 DIAGNOSIS — I130 Hypertensive heart and chronic kidney disease with heart failure and stage 1 through stage 4 chronic kidney disease, or unspecified chronic kidney disease: Secondary | ICD-10-CM | POA: Diagnosis not present

## 2022-08-13 DIAGNOSIS — G8929 Other chronic pain: Secondary | ICD-10-CM | POA: Diagnosis not present

## 2022-08-13 DIAGNOSIS — E1122 Type 2 diabetes mellitus with diabetic chronic kidney disease: Secondary | ICD-10-CM | POA: Diagnosis not present

## 2022-08-13 DIAGNOSIS — R0602 Shortness of breath: Secondary | ICD-10-CM | POA: Diagnosis not present

## 2022-08-13 DIAGNOSIS — R531 Weakness: Secondary | ICD-10-CM | POA: Diagnosis not present

## 2022-08-13 DIAGNOSIS — E1165 Type 2 diabetes mellitus with hyperglycemia: Secondary | ICD-10-CM | POA: Diagnosis present

## 2022-08-13 DIAGNOSIS — I509 Heart failure, unspecified: Secondary | ICD-10-CM

## 2022-08-13 DIAGNOSIS — Z91119 Patient's noncompliance with dietary regimen due to unspecified reason: Secondary | ICD-10-CM

## 2022-08-13 DIAGNOSIS — N179 Acute kidney failure, unspecified: Secondary | ICD-10-CM | POA: Diagnosis present

## 2022-08-13 DIAGNOSIS — Z91018 Allergy to other foods: Secondary | ICD-10-CM

## 2022-08-13 DIAGNOSIS — E119 Type 2 diabetes mellitus without complications: Secondary | ICD-10-CM

## 2022-08-13 DIAGNOSIS — Z90710 Acquired absence of both cervix and uterus: Secondary | ICD-10-CM

## 2022-08-13 DIAGNOSIS — R000 Tachycardia, unspecified: Secondary | ICD-10-CM | POA: Diagnosis present

## 2022-08-13 DIAGNOSIS — Z7902 Long term (current) use of antithrombotics/antiplatelets: Secondary | ICD-10-CM

## 2022-08-13 LAB — BRAIN NATRIURETIC PEPTIDE: B Natriuretic Peptide: 63.8 pg/mL (ref 0.0–100.0)

## 2022-08-13 LAB — CBC WITH DIFFERENTIAL/PLATELET
Abs Immature Granulocytes: 0.05 10*3/uL (ref 0.00–0.07)
Basophils Absolute: 0 10*3/uL (ref 0.0–0.1)
Basophils Relative: 0 %
Eosinophils Absolute: 0 10*3/uL (ref 0.0–0.5)
Eosinophils Relative: 0 %
HCT: 34 % — ABNORMAL LOW (ref 36.0–46.0)
Hemoglobin: 10.7 g/dL — ABNORMAL LOW (ref 12.0–15.0)
Immature Granulocytes: 0 %
Lymphocytes Relative: 37 %
Lymphs Abs: 4.3 10*3/uL — ABNORMAL HIGH (ref 0.7–4.0)
MCH: 29 pg (ref 26.0–34.0)
MCHC: 31.5 g/dL (ref 30.0–36.0)
MCV: 92.1 fL (ref 80.0–100.0)
Monocytes Absolute: 0.6 10*3/uL (ref 0.1–1.0)
Monocytes Relative: 5 %
Neutro Abs: 6.8 10*3/uL (ref 1.7–7.7)
Neutrophils Relative %: 58 %
Platelets: 245 10*3/uL (ref 150–400)
RBC: 3.69 MIL/uL — ABNORMAL LOW (ref 3.87–5.11)
RDW: 15.3 % (ref 11.5–15.5)
WBC: 11.8 10*3/uL — ABNORMAL HIGH (ref 4.0–10.5)
nRBC: 0 % (ref 0.0–0.2)

## 2022-08-13 LAB — BASIC METABOLIC PANEL
Anion gap: 12 (ref 5–15)
BUN: 25 mg/dL — ABNORMAL HIGH (ref 6–20)
CO2: 25 mmol/L (ref 22–32)
Calcium: 7.6 mg/dL — ABNORMAL LOW (ref 8.9–10.3)
Chloride: 101 mmol/L (ref 98–111)
Creatinine, Ser: 1.54 mg/dL — ABNORMAL HIGH (ref 0.44–1.00)
GFR, Estimated: 39 mL/min — ABNORMAL LOW (ref >60)
Glucose, Bld: 193 mg/dL — ABNORMAL HIGH (ref 70–99)
Potassium: 4.3 mmol/L (ref 3.5–5.1)
Sodium: 138 mmol/L (ref 135–145)

## 2022-08-13 LAB — MAGNESIUM: Magnesium: <0.5 mg/dL — CL (ref 1.7–2.4)

## 2022-08-13 LAB — PHOSPHORUS: Phosphorus: 3.2 mg/dL (ref 2.5–4.6)

## 2022-08-13 MED ORDER — FUROSEMIDE 10 MG/ML IJ SOLN
40.0000 mg | Freq: Once | INTRAMUSCULAR | Status: AC
  Administered 2022-08-13: 40 mg via INTRAVENOUS
  Filled 2022-08-13: qty 4

## 2022-08-13 MED ORDER — FUROSEMIDE 10 MG/ML IJ SOLN
40.0000 mg | Freq: Every day | INTRAMUSCULAR | Status: DC
  Administered 2022-08-14: 40 mg via INTRAVENOUS
  Filled 2022-08-13: qty 4

## 2022-08-13 MED ORDER — PREDNISONE 10 MG PO TABS
10.0000 mg | ORAL_TABLET | Freq: Every day | ORAL | Status: DC
Start: 2022-08-14 — End: 2022-08-14
  Administered 2022-08-14: 10 mg via ORAL
  Filled 2022-08-13: qty 1

## 2022-08-13 MED ORDER — CALCIUM GLUCONATE-NACL 1-0.675 GM/50ML-% IV SOLN
1.0000 g | Freq: Once | INTRAVENOUS | Status: AC
  Administered 2022-08-14: 1000 mg via INTRAVENOUS
  Filled 2022-08-13: qty 50

## 2022-08-13 MED ORDER — CLOPIDOGREL BISULFATE 75 MG PO TABS
75.0000 mg | ORAL_TABLET | Freq: Every day | ORAL | Status: DC
  Administered 2022-08-14 – 2022-08-22 (×9): 75 mg via ORAL
  Filled 2022-08-13 (×9): qty 1

## 2022-08-13 MED ORDER — ATORVASTATIN CALCIUM 80 MG PO TABS
80.0000 mg | ORAL_TABLET | Freq: Every day | ORAL | Status: DC
  Administered 2022-08-14 – 2022-08-22 (×9): 80 mg via ORAL
  Filled 2022-08-13 (×9): qty 1

## 2022-08-13 MED ORDER — INSULIN ASPART 100 UNIT/ML IJ SOLN
0.0000 [IU] | Freq: Three times a day (TID) | INTRAMUSCULAR | Status: DC
  Administered 2022-08-14: 5 [IU] via SUBCUTANEOUS
  Administered 2022-08-14: 2 [IU] via SUBCUTANEOUS

## 2022-08-13 MED ORDER — CLONIDINE HCL 0.2 MG PO TABS
0.2000 mg | ORAL_TABLET | Freq: Two times a day (BID) | ORAL | Status: DC
  Administered 2022-08-14 (×2): 0.2 mg via ORAL
  Filled 2022-08-13 (×2): qty 1

## 2022-08-13 MED ORDER — INSULIN DETEMIR 100 UNIT/ML ~~LOC~~ SOLN
15.0000 [IU] | Freq: Every day | SUBCUTANEOUS | Status: DC
  Administered 2022-08-14: 15 [IU] via SUBCUTANEOUS
  Filled 2022-08-13: qty 0.15

## 2022-08-13 MED ORDER — HYDROCHLOROTHIAZIDE 25 MG PO TABS: 50.0000 mg | ORAL_TABLET | Freq: Every day | ORAL | Status: DC

## 2022-08-13 MED ORDER — METOPROLOL SUCCINATE ER 25 MG PO TB24
25.0000 mg | ORAL_TABLET | Freq: Every day | ORAL | Status: DC
  Administered 2022-08-14 – 2022-08-16 (×3): 25 mg via ORAL
  Filled 2022-08-13 (×3): qty 1

## 2022-08-13 MED ORDER — PANTOPRAZOLE SODIUM 40 MG PO TBEC
40.0000 mg | DELAYED_RELEASE_TABLET | Freq: Two times a day (BID) | ORAL | Status: DC
  Administered 2022-08-14 – 2022-08-22 (×17): 40 mg via ORAL
  Filled 2022-08-13 (×17): qty 1

## 2022-08-13 MED ORDER — ENOXAPARIN SODIUM 40 MG/0.4ML IJ SOSY
40.0000 mg | PREFILLED_SYRINGE | Freq: Every day | INTRAMUSCULAR | Status: DC
  Administered 2022-08-14 – 2022-08-19 (×6): 40 mg via SUBCUTANEOUS
  Filled 2022-08-13 (×6): qty 0.4

## 2022-08-13 MED ORDER — CALCIUM CARBONATE 1250 (500 CA) MG PO TABS
1.0000 | ORAL_TABLET | Freq: Two times a day (BID) | ORAL | Status: DC
  Administered 2022-08-14 – 2022-08-22 (×17): 1250 mg via ORAL
  Filled 2022-08-13 (×17): qty 1

## 2022-08-13 MED ORDER — HYDRALAZINE HCL 25 MG PO TABS
25.0000 mg | ORAL_TABLET | Freq: Three times a day (TID) | ORAL | Status: DC
  Administered 2022-08-14 – 2022-08-16 (×8): 25 mg via ORAL
  Filled 2022-08-13 (×8): qty 1

## 2022-08-13 MED ORDER — MAGNESIUM SULFATE 2 GM/50ML IV SOLN
2.0000 g | Freq: Once | INTRAVENOUS | Status: AC
  Administered 2022-08-13: 2 g via INTRAVENOUS
  Filled 2022-08-13: qty 50

## 2022-08-13 NOTE — Assessment & Plan Note (Addendum)
Continue with pantoprazole/.  

## 2022-08-13 NOTE — ED Triage Notes (Signed)
Pt sent [facility] for abnormal magnesium level.  No complaints.

## 2022-08-13 NOTE — ED Provider Triage Note (Signed)
Emergency Medicine Provider Triage Evaluation Note  Erin Jackson , a 56 y.o. female  was evaluated in triage.  Pt complains of sob. Pt at a rehab facility x 3 weeks for CHF.  Report worsening bilateral leg swelling and increased SOB despite being on Lasix.  Provider noted low magnesium level.  No fever, cough, cp  Review of Systems  Positive: As above Negative: As above  Physical Exam  BP (!) 160/113 (BP Location: Right Arm)   Pulse (!) 122   Temp 99.6 F (37.6 C) (Oral)   Resp 20   SpO2 98%  Gen:   Awake, no distress   Resp:  Normal effort  MSK:   Moves extremities without difficulty  Other:    Medical Decision Making  Medically screening exam initiated at 6:35 PM.  Appropriate orders placed.  Erin Jackson was informed that the remainder of the evaluation will be completed by another provider, this initial triage assessment does not replace that evaluation, and the importance of remaining in the ED until their evaluation is complete.     Fayrene Helper, PA-C 08/13/22 1840

## 2022-08-13 NOTE — Assessment & Plan Note (Addendum)
Uncontrolled hyperglycemia.  Her fasting glucose this am 200. Patient tolerating po well.   Will increase sensitivity of insulin sliding scale to higher dose for glucose cover. Resume 20 units basal insulin. Decrease prednisone to 5 mg daily.

## 2022-08-13 NOTE — ED Notes (Signed)
Patient provided with [place] sandwich and gingerale. Patient provided with pillow and warm blankets. Resting comfortably with no other complaints at this time

## 2022-08-13 NOTE — Assessment & Plan Note (Addendum)
Patient with chronic left eye vision loss.  Previously empirically started on steroid for concerns of temporal arthritis.  However had biopsy on 6/13 with diagnosis ruled out.  Currently undergoing long taper of her prednisone. -No med rec from rehab on presentation.  Attempted to call rehab without answer.  Last known to be down to 20 mg on 8/7.  \  Decrease dose to 5 mg of prednisone.

## 2022-08-13 NOTE — Assessment & Plan Note (Addendum)
Echocardiogram with mild reduction in systolic function 40 to 45%. Global hypokinesis. RV systolic function is preserved. Pericardial effusion circumferential, no significant valvular disease.   Documented urine output is 5,500 ml Systolic blood pressure is 140 to 170 mmHg and diastolic in the 90 to 100   Plan to continue with furosemide 80 mg IV q12 and empagliflozin.  Sinus tachycardia Increase metoprolol succinate to 75 mg and continue with hydralazine to 50 mg tid plus isosorbide 20 mg tid.  Continue telemetry monitoring.

## 2022-08-13 NOTE — Assessment & Plan Note (Addendum)
Recently admitted 2 weeks ago for the same.  Secondary to hypomagnesemia and vitamin D deficiency.  PTH at that time was within normal limits.  Patient also reports decreased appetite so likely also diet induced. -Given IV calcium gluconate.  Follow repeat in the morning.  Continue home oral calcium supplementation. -Continue Q7 vitamin D supplementation.  Will need to check med rec with rehab to see timing of last dose.

## 2022-08-13 NOTE — Assessment & Plan Note (Addendum)
Hypocalcemia and hypomagnesemia.   Volume status is improving her net fluid balance since admission is 14,013 ml.  Renal function with serum cr 1,62, K 4,4 and serum bicarbonate at 29. Mg 1.9  Plan to continue diuresis with furosemide and SGLT 2 inh.   Follow up renal function and electrolytes.

## 2022-08-13 NOTE — Assessment & Plan Note (Addendum)
No acute focal deficits Continue antiplatelet therapy with clopidogrel and statin with atorvastatin.  Continue blood pressure control.

## 2022-08-13 NOTE — ED Notes (Signed)
Charge RN notified that pt has critical Magnesium 5mg /dL.

## 2022-08-13 NOTE — H&P (Signed)
History and Physical    Patient: Erin Jackson YYT:[id number] DOB: Aug 11, 1966 DOA: 08/13/2022 DOS: the patient was seen and examined on 08/13/2022 PCP: [facility] [place]  Patient coming from:  Rehab [facility]  Chief Complaint: No chief complaint on file.  HPI: Erin Jackson is a 56 y.o. female with medical history significant of [place] chronic systolic heart failure, CVA, CAD, PVD, hypertension, type 2 diabetes, CKD 3B, hypocalcemia who presents from rehab for critically a low magnesium level.  Patient recently hospitalized from 07/20/22 to 07/27/2022 for hypocalcemia likely secondary to vitamin D deficiency and hypomagnesemia.  She was discharged on oral calcium supplement and vitamin D 50 K units weekly.  She was also treated for aspiration pneumonia and had EGD due to dysphagia with noted small hiatal hernia and gastritis.  She was started on Protonix and Carafate.  Patient ultimately discharged to rehab since she had previous left eye vision loss, frailty and fall risk and felt unsafe to be home alone.  She notes increase spasm of her hands.  Has not been eating well ever since she had stroke in May.  Denies any GI symptoms including nausea, vomiting or diarrhea.   For the past 2 weeks has also noted increase shortness of breath both at rest and with exertion.  also had increasing lower extremity edema.  No chest pain.  She discussed these symptoms to physician at rehab but unclear if they adjusted her diuretics.  Patient attributes her increasing edema to chronic prednisone use.  She was placed on prednisone back in May for concerns of temporal arthritis and is now undergoing a long taper since biopsy from 6/13 was negative.  In the ED, she was afebrile, tachycardic and hypertensive with BP of 160/113.  Calcium at 7.6.  Magnesium less than 0.5.  AKI with creatinine 1.54 from prior of 1.16. Mild leukocytosis of 11.8 K, hemoglobin of 10.7.  Chest x-ray concerning for bilateral  pulmonary edema. EKG on my review with sinus tachycardia, no ST or T wave changes.  She was given IV magnesium, IV calcium and Lasix in the ED.  Hospitalist on-call for admission. Review of Systems: As mentioned in the history of present illness. All other systems reviewed and are negative. Past Medical History:  Diagnosis Date   Anxiety    Chronic back pain    Diabetes mellitus without complication ([place])    GERD (gastroesophageal reflux disease)    Hypertension    Past Surgical History:  Procedure Laterality Date   ABDOMINAL HYSTERECTOMY     ARTERY BIOPSY Left 06/02/2022   Procedure: BIOPSY TEMPORAL ARTERY;  Surgeon: Waynetta Sandy, MD;  Location: [place];  Service: Vascular;  Laterality: Left;   BIOPSY  07/25/2022   Procedure: BIOPSY;  Surgeon: Jackquline Denmark, MD;  Location: [facility] ENDOSCOPY;  Service: Gastroenterology;;   BREAST LUMPECTOMY Left 03/25/2022   Procedure: LEFT BREAST LUMPECTOMY;  Surgeon: Jovita Kussmaul, MD;  Location: [place];  Service: General;  Laterality: Left;   BREAST REDUCTION SURGERY Bilateral 08/15/2018   Procedure: BILATERAL MAMMARY REDUCTION  (BREAST);  Surgeon: Cristine Polio, MD;  Location: [place];  Service: Plastics;  Laterality: Bilateral;   BREAST REDUCTION SURGERY Left 09/25/2019   Procedure: EXCISION OF FAT NECROSIS OF LEFT BREAST;  Surgeon: Cristine Polio, MD;  Location: [place];  Service: Plastics;  Laterality: Left;   CARPAL TUNNEL RELEASE Right 06/17/2020   right thumb CMC arthroplasty with tendon transfer and right carpal tunnel release.  CERVICAL SPINE SURGERY  2015   has a metal plate   ESOPHAGOGASTRODUODENOSCOPY N/A 07/25/2022   Procedure: ESOPHAGOGASTRODUODENOSCOPY (EGD);  Surgeon: Jackquline Denmark, MD;  Location[facility] ENDOSCOPY;  Service: Gastroenterology;  Laterality: N/A;   FOOT SURGERY Left 2014   HAND SURGERY     LEFT HEART CATH AND CORONARY ANGIOGRAPHY N/A 04/09/2022   Procedure:  LEFT HEART CATH AND CORONARY ANGIOGRAPHY;  Surgeon: Dixie Dials, MD;  Location: [facility] [facility];  Service: Cardiovascular;  Laterality: N/A;   REDUCTION MAMMAPLASTY Bilateral 07/2018   Social History:  reports that she quit smoking about 3 months ago. Her smoking use included cigarettes. She smoked an average of .5 packs per day. She has never used smokeless tobacco. She reports current alcohol use. She reports that she does not currently use drugs after having used the following drugs: Marijuana.  Allergies  Allergen Reactions   Cucumber Extract Anaphylaxis   Molds & Smuts Shortness Of Breath   Norvasc [Amlodipine] Other (See Comments)    Muscle became very tight   Other Anaphylaxis and Other (See Comments)    Tree nuts, walnuts, peanuts, pickles   Apple Juice Hives   Aspirin Rash   Ibuprofen Rash   Niacin And Related Rash    Family History  Problem Relation Age of Onset   Breast cancer Maternal Grandmother    Breast cancer Maternal Aunt     Prior to Admission medications   Medication Sig Start Date End Date Taking? Authorizing Provider  acetaminophen (TYLENOL) 325 MG tablet Take 2 tablets (650 mg total) by mouth every 4 (four) hours as needed for fever or mild pain. 05/07/22   Setzer, Edman Circle, PA-C  alprazolam Duanne Moron) 2 MG tablet Take 0.5 tablets (1 mg total) by mouth 2 (two) times daily. 07/25/22   Patrecia Pour, MD  atorvastatin (LIPITOR) 80 MG tablet Take 1 tablet (80 mg total) by mouth daily. Patient taking differently: Take 80 mg by mouth at bedtime. 05/07/22   Setzer, Edman Circle, PA-C  calcium carbonate (OS-CAL - DOSED IN MG OF ELEMENTAL CALCIUM) 1250 (500 Ca) MG tablet Take 1 tablet (1,250 mg total) by mouth 2 (two) times daily with a meal. 07/25/22   Patrecia Pour, MD  cetirizine (ZYRTEC ALLERGY) 10 MG tablet Take 1 tablet (10 mg total) by mouth daily. Patient taking differently: Take 10 mg by mouth daily as needed for allergies. 04/14/22   Valarie Merino, MD  cloNIDine  (CATAPRES) 0.1 MG tablet Take 2 tablets (0.2 mg total) by mouth 2 (two) times daily. 07/25/22   Patrecia Pour, MD  clopidogrel (PLAVIX) 75 MG tablet Take 1 tablet (75 mg total) by mouth daily. 05/07/22   Setzer, Edman Circle, PA-C  EPINEPHRINE 0.3 mg/0.3 mL IJ SOAJ injection Inject 0.3 mLs (0.3 mg total) into the muscle once as needed (allergic reaction). Patient taking differently: Inject 0.3 mg into the muscle as needed for anaphylaxis (allergic reaction). 10/04/18   Fabian November, MD  ferrous gluconate (FERGON) 324 MG tablet Take 324 mg by mouth every other day.    [provider]  hydrALAZINE (APRESOLINE) 25 MG tablet Take 1 tablet (25 mg total) by mouth every 8 (eight) hours. 07/27/22   Patrecia Pour, MD  hydrochlorothiazide (HYDRODIURIL) 50 MG tablet Take 1 tablet (50 mg total) by mouth daily. 07/27/22   Patrecia Pour, MD  HYDROcodone-acetaminophen (NORCO) 10-325 MG tablet Take 0.5-1 tablets by mouth 3 (three) times daily as needed for moderate pain or severe  pain. 07/25/22   Patrecia Pour, MD  insulin lispro (HUMALOG) 100 UNIT/ML injection Inject 2-12 Units into the skin 2 (two) times daily. Per sliding scale, if 70 to 200= 0 units, 201 to 250= 2 units, 251 to 300= 4 units 301 to 350= 6 units, 351 to 400= 8 units, 401 to 450= 10 units, 451 to 600= 12 units    [provider]  Lancets (ONETOUCH DELICA PLUS [id number]) [place] Apply topically daily. 05/03/22   [provider]  LEVEMIR FLEXPEN 100 UNIT/ML FlexPen Inject 20 Units into the skin at bedtime. 07/09/22   [provider]  losartan (COZAAR) 100 MG tablet Take 1 tablet (100 mg total) by mouth daily. Patient taking differently: Take 25 mg by mouth daily. 06/09/22   Wouk, Ailene Rud, MD  metFORMIN (GLUCOPHAGE) 500 MG tablet Take 1 tablet (500 mg total) by mouth 2 (two) times daily with a meal. 05/08/22   Setzer, Edman Circle, PA-C  metoprolol succinate (TOPROL XL) 25 MG 24 hr tablet Take 1 tablet (25 mg total) by mouth daily.  07/25/22   Patrecia Pour, MD  NARCAN 4 MG/0.1ML LIQD nasal spray kit Place 0.4 mg into the nose as needed (opoid overdose). 01/08/20   [provider]  nitroGLYCERIN (NITROSTAT) 0.4 MG SL tablet Place 1 tablet (0.4 mg total) under the tongue every 5 (five) minutes as needed for chest pain. 04/10/22   Elgergawy, Silver Huguenin, MD  ondansetron (ZOFRAN) 4 MG tablet Take 1 tablet (4 mg total) by mouth every 8 (eight) hours as needed for nausea or vomiting. 07/25/22   Patrecia Pour, MD  [facility] VERIO test strip daily. 05/03/22   [provider]  pantoprazole (PROTONIX) 40 MG tablet Take 1 tablet (40 mg total) by mouth 2 (two) times daily before a meal. 07/25/22   Patrecia Pour, MD  predniSONE (DELTASONE) 20 MG tablet Take 1 tablet (20 mg total) by mouth daily. 07/27/22   Patrecia Pour, MD  sucralfate (CARAFATE) 1 g tablet Take 1 tablet (1 g total) by mouth 3 (three) times daily before meals for 5 days. 07/25/22 07/30/22  Patrecia Pour, MD  traZODone (DESYREL) 150 MG tablet Take 1 tablet (150 mg total) by mouth at bedtime. 05/07/22   Setzer, Edman Circle, PA-C  Vitamin D, Ergocalciferol, (DRISDOL) 1.25 MG (50000 UNIT) CAPS capsule Take 1 capsule (50,000 Units total) by mouth every 7 (seven) days. Patient not taking: Reported on 07/20/2022 05/08/22   Barbie Banner, PA-C    Physical Exam: Vitals:   08/13/22 1823 08/13/22 2230 08/13/22 2317 08/13/22 2317  BP: (!) 160/113 (!) 153/109  (!) 158/90  Pulse: (!) 122 (!) 107  (!) 109  Resp: 20 (!) 21  (!) 22  Temp: 99.6 F (37.6 C)  98.1 F (36.7 C)   TempSrc: Oral  Oral   SpO2: 98% 100%  99%   Constitutional: NAD, calm, comfortable comfortable obese female laying upright in bed Eyes: lids and conjunctivae normal with occasional spasm of upper eyelid ENMT: Mucous membranes are moist.  Neck: normal, supple, Respiratory: clear to auscultation bilaterally, no wheezing, no crackles.  Cardiovascular: Regular rate and rhythm, no murmurs / rubs / gallops.  +3  pitting edema bilateral lower extremity up to thigh.   Abdomen: Soft, nontender nondistended.  Bowel sounds positive.  Musculoskeletal: no clubbing / cyanosis. No joint deformity upper and lower extremities. Good ROM, no contractures. Normal muscle tone.  Skin: no rashes, lesions, ulcers. Neurologic: CN 2-12  grossly intact.  Strength 5/5 in all 4.  Psychiatric: Normal judgment and insight. Alert and oriented x 3. Normal mood. Data Reviewed:  See HPI  Assessment and Plan: * Hypocalcemia Recently admitted 2 weeks ago for the same.  Secondary to hypomagnesemia and vitamin D deficiency.  PTH at that time was within normal limits.  Patient also reports decreased appetite so likely also diet induced. -Given IV calcium gluconate.  Follow repeat in the morning.  Continue home oral calcium supplementation. -Continue Q7 vitamin D supplementation.  Will need to check med rec with rehab to see timing of last dose.  Vitamin D deficiency Continue q7 weekly vitamin D pending med rec from rehab for timing of last dose  Hypomagnesemia Magnesium less than 0.5.  Replete with IV magnesium.  Follow in the morning.  Acute renal failure superimposed on stage 3b chronic kidney disease (HCC) - Creatinine elevated 1.54 from prior of 1.16. -Currently being diuresed.  We will monitor closely.  Avoid nephrotoxic agent.  History of CVA (cerebrovascular accident) Continue Plavix and statin.  Vision loss of left eye Patient with chronic left eye vision loss.  Previously empirically started on steroid for concerns of temporal arthritis.  However had biopsy on 6/13 with diagnosis ruled out.  Currently undergoing long taper of her prednisone. -No med rec from rehab on presentation.  Attempted to call rehab without answer.  Last known to be down to 20 mg on 8/7.  Will continue at 10 mg for now pending final med rec from rehab.  Acute systolic CHF (congestive heart failure) ([place]) Presented with symptoms of dyspnea at  rest and exertion with lower extremity edema.  His chest x-ray also shows bilateral pulmonary edema.  Suspect exacerbated by chronic prednisone use. -Does not appear to be on daily diuretics at rehab.  Start daily IV 40 mg Lasix.  Follow intake and output, daily weights. -Last echocardiogram on 04/2022 with EF of 40 to 45%, global hypokinesis, mild LVH and grade 2 diastolic dysfunction.  Obtain new echo.  CAD (coronary artery disease) Continue Plavix and statin  GERD (gastroesophageal reflux disease) Continue Protonix while on prednisone  T2DM (type 2 diabetes mellitus) (HCC) Recent hemoglobin of 6.8% and currently on long steroid taper. -Home regimen at 20 units Levemir and sliding scale insulin.  Start with 15 units here with sliding scale.  Primary hypertension Continue clonidine, hydralazine,and metoprolol.  Hold losartan due to AKI. -Hold HCTZ for now while on IV Lasix to avoid hypotension      Advance Care Planning:   Code Status: Full Code Full  Consults: None  Family Communication: No family at bedside  Severity of Illness: The appropriate patient status for this patient is OBSERVATION. Observation status is judged to be reasonable and necessary in order to provide the required intensity of service to ensure the patient's safety. The patient's presenting symptoms, physical exam findings, and initial radiographic and laboratory data in the context of their medical condition is felt to place them at decreased risk for further clinical deterioration. Furthermore, it is anticipated that the patient will be medically stable for discharge from the hospital within 2 midnights of admission.   Author: Orene Desanctis, DO 08/13/2022 11:46 PM  For on call review [email or web address].

## 2022-08-13 NOTE — ED Provider Notes (Signed)
[facility] EMERGENCY DEPARTMENT Provider Note   CSN: [id number] Arrival date & time: 08/13/22  1818     History  No chief complaint on file.   Erin Jackson is a 56 y.o. female with past medical history of coronary artery disease, CKD 3, recurrent hypocalcemia, heart failure with last ejection fraction of 40 to 45% who presents to the emergency department today with worsening shortness of breath and bilateral lower extremity edema.  Patient is residing at a rehabilitation facility where she had routine blood work drawn today that showed hypomagnesemia to less than 0.5 which had prompted her presentation.  Upon arrival she notes that she has had slowly progressive dyspnea and worsening bilateral lower extremity edema over the past several days despite her being compliant with her Lasix therapy at home.  Patient is unaware of what dose she is taking at home. She denies chest pain today.   HPI     Home Medications Prior to Admission medications   Medication Sig Start Date End Date Taking? Authorizing Provider  acetaminophen (TYLENOL) 325 MG tablet Take 2 tablets (650 mg total) by mouth every 4 (four) hours as needed for fever or mild pain. 05/07/22   Setzer, Edman Circle, PA-C  alprazolam Duanne Moron) 2 MG tablet Take 0.5 tablets (1 mg total) by mouth 2 (two) times daily. 07/25/22   Patrecia Pour, MD  atorvastatin (LIPITOR) 80 MG tablet Take 1 tablet (80 mg total) by mouth daily. Patient taking differently: Take 80 mg by mouth at bedtime. 05/07/22   Setzer, Edman Circle, PA-C  calcium carbonate (OS-CAL - DOSED IN MG OF ELEMENTAL CALCIUM) 1250 (500 Ca) MG tablet Take 1 tablet (1,250 mg total) by mouth 2 (two) times daily with a meal. 07/25/22   Patrecia Pour, MD  cetirizine (ZYRTEC ALLERGY) 10 MG tablet Take 1 tablet (10 mg total) by mouth daily. Patient taking differently: Take 10 mg by mouth daily as needed for allergies. 04/14/22   Valarie Merino, MD  cloNIDine (CATAPRES) 0.1 MG tablet  Take 2 tablets (0.2 mg total) by mouth 2 (two) times daily. 07/25/22   Patrecia Pour, MD  clopidogrel (PLAVIX) 75 MG tablet Take 1 tablet (75 mg total) by mouth daily. 05/07/22   Setzer, Edman Circle, PA-C  EPINEPHRINE 0.3 mg/0.3 mL IJ SOAJ injection Inject 0.3 mLs (0.3 mg total) into the muscle once as needed (allergic reaction). Patient taking differently: Inject 0.3 mg into the muscle as needed for anaphylaxis (allergic reaction). 10/04/18   Fabian November, MD  ferrous gluconate (FERGON) 324 MG tablet Take 324 mg by mouth every other day.    [provider]  hydrALAZINE (APRESOLINE) 25 MG tablet Take 1 tablet (25 mg total) by mouth every 8 (eight) hours. 07/27/22   Patrecia Pour, MD  hydrochlorothiazide (HYDRODIURIL) 50 MG tablet Take 1 tablet (50 mg total) by mouth daily. 07/27/22   Patrecia Pour, MD  HYDROcodone-acetaminophen (NORCO) 10-325 MG tablet Take 0.5-1 tablets by mouth 3 (three) times daily as needed for moderate pain or severe pain. 07/25/22   Patrecia Pour, MD  insulin lispro (HUMALOG) 100 UNIT/ML injection Inject 2-12 Units into the skin 2 (two) times daily. Per sliding scale, if 70 to 200= 0 units, 201 to 250= 2 units, 251 to 300= 4 units 301 to 350= 6 units, 351 to 400= 8 units, 401 to 450= 10 units, 451 to 600= 12 units    [provider]  Lancets ([place]  LANCET33G) MISC Apply topically daily. 05/03/22   [provider]  LEVEMIR FLEXPEN 100 UNIT/ML FlexPen Inject 20 Units into the skin at bedtime. 07/09/22   [provider]  losartan (COZAAR) 100 MG tablet Take 1 tablet (100 mg total) by mouth daily. Patient taking differently: Take 25 mg by mouth daily. 06/09/22   Wouk, Ailene Rud, MD  metFORMIN (GLUCOPHAGE) 500 MG tablet Take 1 tablet (500 mg total) by mouth 2 (two) times daily with a meal. 05/08/22   Setzer, Edman Circle, PA-C  metoprolol succinate (TOPROL XL) 25 MG 24 hr tablet Take 1 tablet (25 mg total) by mouth daily. 07/25/22   Patrecia Pour, MD   NARCAN 4 MG/0.1ML LIQD nasal spray kit Place 0.4 mg into the nose as needed (opoid overdose). 01/08/20   [provider]  nitroGLYCERIN (NITROSTAT) 0.4 MG SL tablet Place 1 tablet (0.4 mg total) under the tongue every 5 (five) minutes as needed for chest pain. 04/10/22   Elgergawy, Silver Huguenin, MD  ondansetron (ZOFRAN) 4 MG tablet Take 1 tablet (4 mg total) by mouth every 8 (eight) hours as needed for nausea or vomiting. 07/25/22   Patrecia Pour, MD  [facility] VERIO test strip daily. 05/03/22   [provider]  pantoprazole (PROTONIX) 40 MG tablet Take 1 tablet (40 mg total) by mouth 2 (two) times daily before a meal. 07/25/22   Patrecia Pour, MD  predniSONE (DELTASONE) 20 MG tablet Take 1 tablet (20 mg total) by mouth daily. 07/27/22   Patrecia Pour, MD  sucralfate (CARAFATE) 1 g tablet Take 1 tablet (1 g total) by mouth 3 (three) times daily before meals for 5 days. 07/25/22 07/30/22  Patrecia Pour, MD  traZODone (DESYREL) 150 MG tablet Take 1 tablet (150 mg total) by mouth at bedtime. 05/07/22   Setzer, Edman Circle, PA-C  Vitamin D, Ergocalciferol, (DRISDOL) 1.25 MG (50000 UNIT) CAPS capsule Take 1 capsule (50,000 Units total) by mouth every 7 (seven) days. Patient not taking: Reported on 07/20/2022 05/08/22   Barbie Banner, PA-C      Allergies    Cucumber extract, Molds & smuts, Norvasc [amlodipine], Other, Apple juice, Aspirin, Ibuprofen, and Niacin and related    Review of Systems   Review of Systems  Constitutional:  Negative for chills and fever.  HENT:  Negative for ear pain and sore throat.   Eyes:  Negative for pain and visual disturbance.  Respiratory:  Positive for shortness of breath. Negative for cough.   Cardiovascular:  Positive for leg swelling. Negative for chest pain and palpitations.  Gastrointestinal:  Negative for abdominal pain and vomiting.  Genitourinary:  Negative for dysuria and hematuria.  Musculoskeletal:  Negative for arthralgias and back pain.  Skin:   Negative for color change and rash.  Neurological:  Negative for seizures and syncope.  All other systems reviewed and are negative.   Physical Exam Updated Vital Signs BP (!) 160/113 (BP Location: Right Arm)   Pulse (!) 122   Temp 99.6 F (37.6 C) (Oral)   Resp 20   SpO2 98%  Physical Exam Vitals and nursing note reviewed.  Constitutional:      General: She is not in acute distress.    Appearance: She is well-developed.     Comments: Upon entering the exam room the patient is lying in bed awake and alert no acute distress  HENT:     Head: Normocephalic and atraumatic.  Eyes:     Conjunctiva/sclera: Conjunctivae  normal.  Cardiovascular:     Rate and Rhythm: Normal rate and regular rhythm.     Heart sounds: No murmur heard. Pulmonary:     Effort: Pulmonary effort is normal. No respiratory distress.     Breath sounds: Normal breath sounds.     Comments: Lungs are clear to auscultation bilaterally with exception of questionable faint bibasilar crackles.  She is speaking in full sentences without difficulty.  No increased work of breathing. Abdominal:     Palpations: Abdomen is soft.     Tenderness: There is no abdominal tenderness.  Musculoskeletal:        General: Swelling present.     Cervical back: Neck supple.     Right lower leg: Edema present.     Left lower leg: Edema present.     Comments: 2+ symmetric pitting edema of the bilateral lower extremities  Skin:    General: Skin is warm and dry.     Capillary Refill: Capillary refill takes less than 2 seconds.  Neurological:     Mental Status: She is alert.     Comments: Fully alert and oriented moving all extremity spontaneously grossly neurologically intact.  Psychiatric:        Mood and Affect: Mood normal.     ED Results / Procedures / Treatments   Labs (all labs ordered are listed, but only abnormal results are displayed) Labs Reviewed  BASIC METABOLIC PANEL - Abnormal; Notable for the following components:       Result Value   Glucose, Bld 193 (*)    BUN 25 (*)    Creatinine, Ser 1.54 (*)    Calcium 7.6 (*)    GFR, Estimated 39 (*)    All other components within normal limits  CBC WITH DIFFERENTIAL/PLATELET - Abnormal; Notable for the following components:   WBC 11.8 (*)    RBC 3.69 (*)    Hemoglobin 10.7 (*)    HCT 34.0 (*)    Lymphs Abs 4.3 (*)    All other components within normal limits  MAGNESIUM - Abnormal; Notable for the following components:   Magnesium <0.5 (*)    All other components within normal limits  BRAIN NATRIURETIC PEPTIDE  PHOSPHORUS    EKG None  Radiology DG Chest 2 View  Result Date: 08/13/2022 CLINICAL DATA:  Shortness of breath EXAM: CHEST - 2 VIEW COMPARISON:  Chest CT dated 06/15/2022. FINDINGS: The heart is enlarged. Mild bilateral lower lung predominant interstitial opacities are noted. There is no pleural effusion or pneumothorax. Degenerative changes are seen in the spine. IMPRESSION: Mild bilateral lower lung predominant interstitial opacities may reflect mild pulmonary edema. Electronically Signed   By: Zerita Boers M.D.   On: 08/13/2022 19:51    Procedures Procedures    Medications Ordered in ED Medications - No data to display  ED Course/ Medical Decision Making/ A&P Clinical Course as of 08/13/22 2207  Thu Aug 13, 2022  2159 56 yo female presenting to ED with low mag from rehab facility.  Today mg and cal low.  Recent admission for similar.  Hx of HCF.  Here mag undetectable, Cal 7.6.  ECG per my interpretation with sinus tachycardia no prolonged Qtc.  DG chest with pulmonary edema.  Plan to replete electrolytes, give IV electrolyte repletion, and admit [MT]    Clinical Course User Index [MT] Trifan, Carola Rhine, MD  Medical Decision Making Risk Prescription drug management. Decision regarding hospitalization.   Patient presents the emergency department initially tachycardic to the 120s otherwise  hemodynamically stable with multiple metabolic abnormalities as listed below in addition to worsening bilateral lower extremity edema and dyspnea.  Differential diagnosis includes CHF exacerbation versus less likely pneumonia in the absence of other infectious symptoms versus less likely PE given exam consistent with volume overload and absence of chest pain currently.  I have personally reviewed and interpreted the patient's EKG which was markable for sinus tachycardia to the 120s.  No ischemic changes.  I have further reviewed the patient's, chest x-ray which was remarkable for mild pulmonary edema.   I have personally reviewed and interpreted the patient's laboratory work-up which was remarkable for multiple metabolic abnormalities including magnesium below detectable limit, worsening kidney function, and hypocalcemia.  Patient was given intravenous magnesium, calcium and was given Lasix for volume overloaded state.  She was admitted to medicine for worsening volume overloaded state in addition to multiple metabolic abnormalities.          Final Clinical Impression(s) / ED Diagnoses Final diagnoses:  Hypervolemia, unspecified hypervolemia type  Hypomagnesemia  Hypocalcemia    Rx / DC Orders ED Discharge Orders     None         Levie Heritage, MD 08/13/22 2313    Wyvonnia Dusky, MD 08/14/22 1419

## 2022-08-13 NOTE — Assessment & Plan Note (Signed)
Magnesium less than 0.5.  Replete with IV magnesium.  Follow in the morning.

## 2022-08-13 NOTE — Assessment & Plan Note (Signed)
Continue q7 weekly vitamin D pending med rec from rehab for timing of last dose

## 2022-08-13 NOTE — Assessment & Plan Note (Addendum)
No chest pain, no acute coronary syndrome.  Continue with clopidogrel and statin therapy.

## 2022-08-13 NOTE — Assessment & Plan Note (Addendum)
Uncontrolled hypertension  Her blood pressure has improved with further diuresis.  She continue tachycardic despite high dose of metoprolol succinate 75 mg.  No RAS inhibition due to reduced GFR.

## 2022-08-14 ENCOUNTER — Encounter (HOSPITAL_COMMUNITY): Payer: Self-pay | Admitting: Internal Medicine

## 2022-08-14 ENCOUNTER — Observation Stay (HOSPITAL_COMMUNITY): Payer: Medicare Other

## 2022-08-14 DIAGNOSIS — I444 Left anterior fascicular block: Secondary | ICD-10-CM | POA: Diagnosis present

## 2022-08-14 DIAGNOSIS — R072 Precordial pain: Secondary | ICD-10-CM | POA: Diagnosis not present

## 2022-08-14 DIAGNOSIS — M6281 Muscle weakness (generalized): Secondary | ICD-10-CM | POA: Diagnosis not present

## 2022-08-14 DIAGNOSIS — I5022 Chronic systolic (congestive) heart failure: Secondary | ICD-10-CM | POA: Diagnosis not present

## 2022-08-14 DIAGNOSIS — Z794 Long term (current) use of insulin: Secondary | ICD-10-CM | POA: Diagnosis not present

## 2022-08-14 DIAGNOSIS — I2510 Atherosclerotic heart disease of native coronary artery without angina pectoris: Secondary | ICD-10-CM | POA: Diagnosis not present

## 2022-08-14 DIAGNOSIS — I10 Essential (primary) hypertension: Secondary | ICD-10-CM | POA: Diagnosis not present

## 2022-08-14 DIAGNOSIS — D509 Iron deficiency anemia, unspecified: Secondary | ICD-10-CM | POA: Diagnosis not present

## 2022-08-14 DIAGNOSIS — R1311 Dysphagia, oral phase: Secondary | ICD-10-CM | POA: Diagnosis not present

## 2022-08-14 DIAGNOSIS — R000 Tachycardia, unspecified: Secondary | ICD-10-CM | POA: Diagnosis not present

## 2022-08-14 DIAGNOSIS — N183 Chronic kidney disease, stage 3 unspecified: Secondary | ICD-10-CM | POA: Diagnosis not present

## 2022-08-14 DIAGNOSIS — E785 Hyperlipidemia, unspecified: Secondary | ICD-10-CM | POA: Diagnosis not present

## 2022-08-14 DIAGNOSIS — I5040 Unspecified combined systolic (congestive) and diastolic (congestive) heart failure: Secondary | ICD-10-CM | POA: Diagnosis not present

## 2022-08-14 DIAGNOSIS — M5451 Vertebrogenic low back pain: Secondary | ICD-10-CM | POA: Diagnosis not present

## 2022-08-14 DIAGNOSIS — M549 Dorsalgia, unspecified: Secondary | ICD-10-CM | POA: Diagnosis present

## 2022-08-14 DIAGNOSIS — E1165 Type 2 diabetes mellitus with hyperglycemia: Secondary | ICD-10-CM | POA: Diagnosis present

## 2022-08-14 DIAGNOSIS — E1151 Type 2 diabetes mellitus with diabetic peripheral angiopathy without gangrene: Secondary | ICD-10-CM | POA: Diagnosis present

## 2022-08-14 DIAGNOSIS — R296 Repeated falls: Secondary | ICD-10-CM | POA: Diagnosis not present

## 2022-08-14 DIAGNOSIS — I739 Peripheral vascular disease, unspecified: Secondary | ICD-10-CM | POA: Diagnosis not present

## 2022-08-14 DIAGNOSIS — E7800 Pure hypercholesterolemia, unspecified: Secondary | ICD-10-CM | POA: Diagnosis not present

## 2022-08-14 DIAGNOSIS — I5033 Acute on chronic diastolic (congestive) heart failure: Secondary | ICD-10-CM

## 2022-08-14 DIAGNOSIS — R262 Difficulty in walking, not elsewhere classified: Secondary | ICD-10-CM | POA: Diagnosis not present

## 2022-08-14 DIAGNOSIS — I130 Hypertensive heart and chronic kidney disease with heart failure and stage 1 through stage 4 chronic kidney disease, or unspecified chronic kidney disease: Secondary | ICD-10-CM | POA: Diagnosis not present

## 2022-08-14 DIAGNOSIS — I5023 Acute on chronic systolic (congestive) heart failure: Secondary | ICD-10-CM | POA: Diagnosis not present

## 2022-08-14 DIAGNOSIS — Z8673 Personal history of transient ischemic attack (TIA), and cerebral infarction without residual deficits: Secondary | ICD-10-CM | POA: Diagnosis not present

## 2022-08-14 DIAGNOSIS — H5462 Unqualified visual loss, left eye, normal vision right eye: Secondary | ICD-10-CM | POA: Diagnosis not present

## 2022-08-14 DIAGNOSIS — K219 Gastro-esophageal reflux disease without esophagitis: Secondary | ICD-10-CM | POA: Diagnosis not present

## 2022-08-14 DIAGNOSIS — E1169 Type 2 diabetes mellitus with other specified complication: Secondary | ICD-10-CM | POA: Diagnosis not present

## 2022-08-14 DIAGNOSIS — F419 Anxiety disorder, unspecified: Secondary | ICD-10-CM | POA: Diagnosis present

## 2022-08-14 DIAGNOSIS — E559 Vitamin D deficiency, unspecified: Secondary | ICD-10-CM | POA: Diagnosis not present

## 2022-08-14 DIAGNOSIS — I5043 Acute on chronic combined systolic (congestive) and diastolic (congestive) heart failure: Secondary | ICD-10-CM | POA: Diagnosis not present

## 2022-08-14 DIAGNOSIS — R54 Age-related physical debility: Secondary | ICD-10-CM | POA: Diagnosis not present

## 2022-08-14 DIAGNOSIS — H53132 Sudden visual loss, left eye: Secondary | ICD-10-CM | POA: Diagnosis not present

## 2022-08-14 DIAGNOSIS — I3139 Other pericardial effusion (noninflammatory): Secondary | ICD-10-CM | POA: Diagnosis not present

## 2022-08-14 DIAGNOSIS — N179 Acute kidney failure, unspecified: Secondary | ICD-10-CM | POA: Diagnosis not present

## 2022-08-14 DIAGNOSIS — I5021 Acute systolic (congestive) heart failure: Secondary | ICD-10-CM | POA: Diagnosis not present

## 2022-08-14 DIAGNOSIS — I2583 Coronary atherosclerosis due to lipid rich plaque: Secondary | ICD-10-CM | POA: Diagnosis not present

## 2022-08-14 DIAGNOSIS — N1832 Chronic kidney disease, stage 3b: Secondary | ICD-10-CM | POA: Diagnosis not present

## 2022-08-14 DIAGNOSIS — E119 Type 2 diabetes mellitus without complications: Secondary | ICD-10-CM | POA: Diagnosis not present

## 2022-08-14 DIAGNOSIS — J449 Chronic obstructive pulmonary disease, unspecified: Secondary | ICD-10-CM | POA: Diagnosis not present

## 2022-08-14 DIAGNOSIS — E1122 Type 2 diabetes mellitus with diabetic chronic kidney disease: Secondary | ICD-10-CM | POA: Diagnosis not present

## 2022-08-14 DIAGNOSIS — I429 Cardiomyopathy, unspecified: Secondary | ICD-10-CM | POA: Diagnosis not present

## 2022-08-14 DIAGNOSIS — I509 Heart failure, unspecified: Secondary | ICD-10-CM

## 2022-08-14 DIAGNOSIS — R2689 Other abnormalities of gait and mobility: Secondary | ICD-10-CM | POA: Diagnosis not present

## 2022-08-14 DIAGNOSIS — E8809 Other disorders of plasma-protein metabolism, not elsewhere classified: Secondary | ICD-10-CM | POA: Diagnosis not present

## 2022-08-14 DIAGNOSIS — R279 Unspecified lack of coordination: Secondary | ICD-10-CM | POA: Diagnosis not present

## 2022-08-14 DIAGNOSIS — G8929 Other chronic pain: Secondary | ICD-10-CM | POA: Diagnosis present

## 2022-08-14 LAB — COMPREHENSIVE METABOLIC PANEL
ALT: 14 U/L (ref 0–44)
AST: 14 U/L — ABNORMAL LOW (ref 15–41)
Albumin: 2.7 g/dL — ABNORMAL LOW (ref 3.5–5.0)
Alkaline Phosphatase: 53 U/L (ref 38–126)
Anion gap: 10 (ref 5–15)
BUN: 27 mg/dL — ABNORMAL HIGH (ref 6–20)
CO2: 29 mmol/L (ref 22–32)
Calcium: 8.1 mg/dL — ABNORMAL LOW (ref 8.9–10.3)
Chloride: 101 mmol/L (ref 98–111)
Creatinine, Ser: 1.47 mg/dL — ABNORMAL HIGH (ref 0.44–1.00)
GFR, Estimated: 41 mL/min — ABNORMAL LOW (ref >60)
Glucose, Bld: 200 mg/dL — ABNORMAL HIGH (ref 70–99)
Potassium: 4 mmol/L (ref 3.5–5.1)
Sodium: 140 mmol/L (ref 135–145)
Total Bilirubin: 0.4 mg/dL (ref 0.3–1.2)
Total Protein: 5.6 g/dL — ABNORMAL LOW (ref 6.5–8.1)

## 2022-08-14 LAB — GLUCOSE, CAPILLARY
Glucose-Capillary: 414 mg/dL — ABNORMAL HIGH (ref 70–99)
Glucose-Capillary: 235 mg/dL — ABNORMAL HIGH (ref 70–99)

## 2022-08-14 LAB — CBC
HCT: 31.7 % — ABNORMAL LOW (ref 36.0–46.0)
Hemoglobin: 10.1 g/dL — ABNORMAL LOW (ref 12.0–15.0)
MCH: 28.6 pg (ref 26.0–34.0)
MCHC: 31.9 g/dL (ref 30.0–36.0)
MCV: 89.8 fL (ref 80.0–100.0)
Platelets: 241 10*3/uL (ref 150–400)
RBC: 3.53 MIL/uL — ABNORMAL LOW (ref 3.87–5.11)
RDW: 15.3 % (ref 11.5–15.5)
WBC: 11.2 10*3/uL — ABNORMAL HIGH (ref 4.0–10.5)
nRBC: 0 % (ref 0.0–0.2)

## 2022-08-14 LAB — CBG MONITORING, ED
Glucose-Capillary: 178 mg/dL — ABNORMAL HIGH (ref 70–99)
Glucose-Capillary: 294 mg/dL — ABNORMAL HIGH (ref 70–99)

## 2022-08-14 LAB — MAGNESIUM
Magnesium: 0.9 mg/dL — CL (ref 1.7–2.4)
Magnesium: 1.6 mg/dL — ABNORMAL LOW (ref 1.7–2.4)

## 2022-08-14 MED ORDER — HYDROCODONE-ACETAMINOPHEN 5-325 MG PO TABS
1.0000 | ORAL_TABLET | Freq: Three times a day (TID) | ORAL | Status: DC | PRN
  Administered 2022-08-14 – 2022-08-22 (×18): 1 via ORAL
  Filled 2022-08-14 (×19): qty 1

## 2022-08-14 MED ORDER — FUROSEMIDE 10 MG/ML IJ SOLN
80.0000 mg | Freq: Two times a day (BID) | INTRAMUSCULAR | Status: DC
  Administered 2022-08-14 – 2022-08-18 (×9): 80 mg via INTRAVENOUS
  Filled 2022-08-14 (×9): qty 8

## 2022-08-14 MED ORDER — ISOSORBIDE DINITRATE 10 MG PO TABS
20.0000 mg | ORAL_TABLET | Freq: Three times a day (TID) | ORAL | Status: DC
  Administered 2022-08-14 – 2022-08-19 (×15): 20 mg via ORAL
  Filled 2022-08-14 (×15): qty 2

## 2022-08-14 MED ORDER — INSULIN DETEMIR 100 UNIT/ML ~~LOC~~ SOLN
20.0000 [IU] | Freq: Every day | SUBCUTANEOUS | Status: DC
  Administered 2022-08-15 – 2022-08-16 (×2): 20 [IU] via SUBCUTANEOUS
  Filled 2022-08-14 (×2): qty 0.2

## 2022-08-14 MED ORDER — TRAZODONE HCL 50 MG PO TABS
150.0000 mg | ORAL_TABLET | Freq: Every day | ORAL | Status: DC
  Administered 2022-08-14 – 2022-08-15 (×2): 150 mg via ORAL
  Filled 2022-08-14 (×2): qty 1

## 2022-08-14 MED ORDER — ALPRAZOLAM 0.5 MG PO TABS
2.0000 mg | ORAL_TABLET | Freq: Two times a day (BID) | ORAL | Status: DC
  Administered 2022-08-14 – 2022-08-17 (×6): 2 mg via ORAL
  Filled 2022-08-14 (×6): qty 4

## 2022-08-14 MED ORDER — PREDNISONE 5 MG PO TABS
5.0000 mg | ORAL_TABLET | Freq: Every day | ORAL | Status: DC
  Administered 2022-08-15 – 2022-08-22 (×8): 5 mg via ORAL
  Filled 2022-08-14 (×8): qty 1

## 2022-08-14 MED ORDER — ALPRAZOLAM 0.25 MG PO TABS
1.0000 mg | ORAL_TABLET | Freq: Two times a day (BID) | ORAL | Status: DC
  Administered 2022-08-14: 1 mg via ORAL
  Filled 2022-08-14: qty 4

## 2022-08-14 MED ORDER — INSULIN ASPART 100 UNIT/ML IJ SOLN
0.0000 [IU] | Freq: Three times a day (TID) | INTRAMUSCULAR | Status: DC
  Administered 2022-08-15: 5 [IU] via SUBCUTANEOUS
  Administered 2022-08-15: 11 [IU] via SUBCUTANEOUS
  Administered 2022-08-15: 3 [IU] via SUBCUTANEOUS
  Administered 2022-08-16 (×3): 5 [IU] via SUBCUTANEOUS
  Administered 2022-08-17: 8 [IU] via SUBCUTANEOUS
  Administered 2022-08-17: 5 [IU] via SUBCUTANEOUS
  Administered 2022-08-17: 15 [IU] via SUBCUTANEOUS
  Administered 2022-08-18: 8 [IU] via SUBCUTANEOUS
  Administered 2022-08-18 (×2): 11 [IU] via SUBCUTANEOUS
  Administered 2022-08-19 (×2): 8 [IU] via SUBCUTANEOUS
  Administered 2022-08-19: 5 [IU] via SUBCUTANEOUS
  Administered 2022-08-20: 15 [IU] via SUBCUTANEOUS
  Administered 2022-08-20: 5 [IU] via SUBCUTANEOUS
  Administered 2022-08-20 – 2022-08-21 (×3): 8 [IU] via SUBCUTANEOUS
  Administered 2022-08-21 – 2022-08-22 (×2): 5 [IU] via SUBCUTANEOUS
  Administered 2022-08-22: 3 [IU] via SUBCUTANEOUS

## 2022-08-14 MED ORDER — MAGNESIUM SULFATE 4 GM/100ML IV SOLN
4.0000 g | Freq: Once | INTRAVENOUS | Status: AC
  Administered 2022-08-14: 4 g via INTRAVENOUS
  Filled 2022-08-14 (×2): qty 100

## 2022-08-14 MED ORDER — EMPAGLIFLOZIN 10 MG PO TABS
10.0000 mg | ORAL_TABLET | Freq: Every day | ORAL | Status: DC
  Administered 2022-08-15 – 2022-08-19 (×5): 10 mg via ORAL
  Filled 2022-08-14 (×5): qty 1

## 2022-08-14 MED ORDER — MAGNESIUM SULFATE 4 GM/100ML IV SOLN
4.0000 g | Freq: Once | INTRAVENOUS | Status: AC
  Administered 2022-08-14: 4 g via INTRAVENOUS
  Filled 2022-08-14: qty 100

## 2022-08-14 MED ORDER — INSULIN ASPART 100 UNIT/ML IJ SOLN
10.0000 [IU] | Freq: Once | INTRAMUSCULAR | Status: AC
Start: 2022-08-14 — End: 2022-08-14
  Administered 2022-08-14: 10 [IU] via SUBCUTANEOUS

## 2022-08-14 NOTE — Progress Notes (Signed)
Pt is blind in left eye due to previous stroke.

## 2022-08-14 NOTE — Progress Notes (Addendum)
Progress Note   Patient: Erin Jackson TKW:[id number] DOB: 09-23-1966 DOA: 08/13/2022     0 DOS: the patient was seen and examined on 08/14/2022   Brief hospital course: Erin Jackson was admitted to the hospital with the working diagnosis of heart failure decompensation.   56 yo female with the past medical history of heart failure, CVA, CAD, hypertension, CKD stage 3b and T2DM who presented with dyspnea. Reported 2 weeks of worsening dyspnea, associated with lower extremity edema.  Recent hospitalization 07/20/22 to 07/27/22 for hypocalcemia due to vitamin D deficiency and hypomagnesemia, hospitalization complicated with aspiration pneumonia and was discharged to SNF.  On her initial physical examination her blood pressure was 160/113, HR 122, RR 22, 02 saturation 99%, lungs with no wheezing or rhonchi, heart with S1 and S2 present with no gallops, abdomen not distended and positive lower extremity edema +++ pitting.   Na 138, K 4,3 CL 101, bicarbonate 25, glucose 193, bun 25 cr 1,54  Mg <0.5  Ca 7.6  BNP 63,8  Wbc 11.8, hgb 10.7 plt 245  Chest radiograph with mild hilar vascular congestion bilaterally.   EKG 126 bpm, left axis, left anterior fascicular block, sinus rhythm with no significant ST segment or T wave changes.   Assessment and Plan: * Acute systolic CHF (congestive heart failure) (HCC) Echocardiogram with mild reduction in systolic function 40 to 45%. Global hypokinesis. RV systolic function is preserved. Pericardial effusion circumferential, no significant valvular disease.   Documented urine output is 1,500  Systolic blood pressure is 150 to 160 mmHg.   Significant lower extremity edema.   Plan to continue diuresis with furosemide, will increase dose to 80 mg IV q12. Continue blood pressure control with metoprolol and hydralazine Will hold on clonidine and will add isosorbide.  Add SGLT 2 inh.    Acute renal failure superimposed on stage 3b chronic kidney disease  (HCC) Hypocalcemia and hypomagnesemia.   Renal function with serum cr at 1,47 with K at 4,0 and serum mg at 0,9,  Patient had 4 g mag sulfate this am.  Plan to repeat Mg level this pm. Continue diuresis with furosemide and SGLT 2 inh.  Follow up renal function in am, avoid hypotension and nephrotoxic medications.   CAD (coronary artery disease) No chest pain, no acute coronary syndrome.  Continue with clopidogrel and statin therapy.   GERD (gastroesophageal reflux disease) Continue with pantoprazole.   History of CVA (cerebrovascular accident) No acute focal deficits Continue antiplatelet therapy and statin   Primary hypertension Continue blood pressure control with metoprolol and hydralazine, will add isosorbide. Dc clonidine Continue diuresis with furosemide and SGLT 2 inh.   T2DM (type 2 diabetes mellitus) (HCC) Uncontrolled hyperglycemia.  Her fasting glucose this am 200. Patient tolerating po well.   Will increase sensitivity of insulin sliding scale to higher dose for glucose cover. Resume 20 units basal insulin. Decrease prednisone to 5 mg daily.    Vision loss of left eye Patient with chronic left eye vision loss.  Previously empirically started on steroid for concerns of temporal arthritis.  However had biopsy on 6/13 with diagnosis ruled out.  Currently undergoing long taper of her prednisone. -No med rec from rehab on presentation.  Attempted to call rehab without answer.  Last known to be down to 20 mg on 8/7.  \  Decrease dose to 5 mg of prednisone.   Vitamin D deficiency Continue q7 weekly vitamin D pending med rec from rehab for timing of last dose  Subjective: Patient having lower extremities cramp, severe worsening lower extremity edema   Physical Exam: Vitals:   08/14/22 1205 08/14/22 1300 08/14/22 1400 08/14/22 1500  BP: (!) 133/95 (!) 137/92 (!) 152/90 (!) 161/97  Pulse: (!) 104 (!) 115 (!) 105 98  Resp: (!) 26 (!) 22 (!) 24 15   Temp:    98 F (36.7 C)  TempSrc:      SpO2: 96% 95% 100% 99%   Neurology awake and alert ENT with mild pallor Cardiovascular with S1 and S2 present and rhythmic with no gallops, or rubs Respiratory with rales at bases with no wheezing Abdomen not distended Positive lower extremity edema +++ pitting  Data Reviewed:    Family Communication: no family at the bedside   Disposition: Status is: Observation The patient will require care spanning > 2 midnights and should be moved to inpatient because: heart failure   Planned Discharge Destination: Home     Author: Coralie Keens, MD 08/14/2022 4:42 PM  For on call review [email or web address].

## 2022-08-14 NOTE — Progress Notes (Signed)
Heart Failure Nurse Navigator Progress Note  Navigation team following this hospitalization. Screening pending further testing/cardiac workup.   Echo pending  Jylan Loeza, RN, BSN, CHFN Heart Failure Navigator Heart & Vascular Care Navigation Team  

## 2022-08-14 NOTE — Hospital Course (Signed)
Erin Jackson was admitted to the hospital with the working diagnosis of heart failure decompensation.   56 yo female with the past medical history of heart failure, CVA, CAD, hypertension, CKD stage 3b and T2DM who presented with dyspnea. Reported 2 weeks of worsening dyspnea, associated with lower extremity edema.  Recent hospitalization 07/20/22 to 07/27/22 for hypocalcemia due to vitamin D deficiency and hypomagnesemia, hospitalization complicated with aspiration pneumonia and was discharged to SNF.  On her initial physical examination her blood pressure was 160/113, HR 122, RR 22, 02 saturation 99%, lungs with no wheezing or rhonchi, heart with S1 and S2 present with no gallops, abdomen not distended and positive lower extremity edema +++ pitting.   Na 138, K 4,3 CL 101, bicarbonate 25, glucose 193, bun 25 cr 1,54  Mg <0.5  Ca 7.6  BNP 63,8  Wbc 11.8, hgb 10.7 plt 245  Chest radiograph with mild hilar vascular congestion bilaterally.   EKG 126 bpm, left axis, left anterior fascicular block, sinus rhythm with no significant ST segment or T wave changes.   Patient has been placed on furosemide for diuresis.  Mag supplementation.   08/27 patient diuresing well, this am with hypertension and sinus tachycardia.  08/28 her volume status continue to improve, continue with sinus tachycardia.  08/29 responding well to diuresis, but continue tachycardic despite increased dose of metoprolol.

## 2022-08-15 DIAGNOSIS — K219 Gastro-esophageal reflux disease without esophagitis: Secondary | ICD-10-CM | POA: Diagnosis not present

## 2022-08-15 DIAGNOSIS — E669 Obesity, unspecified: Secondary | ICD-10-CM | POA: Diagnosis present

## 2022-08-15 DIAGNOSIS — I10 Essential (primary) hypertension: Secondary | ICD-10-CM | POA: Diagnosis not present

## 2022-08-15 DIAGNOSIS — N179 Acute kidney failure, unspecified: Secondary | ICD-10-CM | POA: Diagnosis not present

## 2022-08-15 DIAGNOSIS — I5021 Acute systolic (congestive) heart failure: Secondary | ICD-10-CM | POA: Diagnosis not present

## 2022-08-15 LAB — GLUCOSE, CAPILLARY
Glucose-Capillary: 194 mg/dL — ABNORMAL HIGH (ref 70–99)
Glucose-Capillary: 247 mg/dL — ABNORMAL HIGH (ref 70–99)
Glucose-Capillary: 316 mg/dL — ABNORMAL HIGH (ref 70–99)
Glucose-Capillary: 365 mg/dL — ABNORMAL HIGH (ref 70–99)

## 2022-08-15 LAB — BASIC METABOLIC PANEL
Anion gap: 10 (ref 5–15)
BUN: 33 mg/dL — ABNORMAL HIGH (ref 6–20)
CO2: 30 mmol/L (ref 22–32)
Calcium: 8.8 mg/dL — ABNORMAL LOW (ref 8.9–10.3)
Chloride: 98 mmol/L (ref 98–111)
Creatinine, Ser: 1.41 mg/dL — ABNORMAL HIGH (ref 0.44–1.00)
GFR, Estimated: 43 mL/min — ABNORMAL LOW (ref >60)
Glucose, Bld: 203 mg/dL — ABNORMAL HIGH (ref 70–99)
Potassium: 4.3 mmol/L (ref 3.5–5.1)
Sodium: 138 mmol/L (ref 135–145)

## 2022-08-15 LAB — MAGNESIUM: Magnesium: 2.3 mg/dL (ref 1.7–2.4)

## 2022-08-15 MED ORDER — CYCLOBENZAPRINE HCL 5 MG PO TABS
5.0000 mg | ORAL_TABLET | Freq: Three times a day (TID) | ORAL | Status: DC | PRN
  Administered 2022-08-15 – 2022-08-16 (×3): 5 mg via ORAL
  Filled 2022-08-15 (×3): qty 1

## 2022-08-15 NOTE — Assessment & Plan Note (Signed)
Calculated BMI 39.0

## 2022-08-15 NOTE — Progress Notes (Addendum)
Progress Note   Patient: Erin Jackson PPI:[id number] DOB: 01/25/1966 DOA: 08/13/2022     1 DOS: the patient was seen and examined on 08/15/2022   Brief hospital course: Erin Jackson was admitted to the hospital with the working diagnosis of heart failure decompensation.   56 yo female with the past medical history of heart failure, CVA, CAD, hypertension, CKD stage 3b and T2DM who presented with dyspnea. Reported 2 weeks of worsening dyspnea, associated with lower extremity edema.  Recent hospitalization 07/20/22 to 07/27/22 for hypocalcemia due to vitamin D deficiency and hypomagnesemia, hospitalization complicated with aspiration pneumonia and was discharged to SNF.  On her initial physical examination her blood pressure was 160/113, HR 122, RR 22, 02 saturation 99%, lungs with no wheezing or rhonchi, heart with S1 and S2 present with no gallops, abdomen not distended and positive lower extremity edema +++ pitting.   Na 138, K 4,3 CL 101, bicarbonate 25, glucose 193, bun 25 cr 1,54  Mg <0.5  Ca 7.6  BNP 63,8  Wbc 11.8, hgb 10.7 plt 245  Chest radiograph with mild hilar vascular congestion bilaterally.   EKG 126 bpm, left axis, left anterior fascicular block, sinus rhythm with no significant ST segment or T wave changes.   Patient has been placed on furosemide for diuresis.  Mag supplementation.   Assessment and Plan: * Acute systolic CHF (congestive heart failure) (HCC) Echocardiogram with mild reduction in systolic function 40 to 45%. Global hypokinesis. RV systolic function is preserved. Pericardial effusion circumferential, no significant valvular disease.   Documented urine output is 2,150 ml Systolic blood pressure is 150 to 170 mmHg.   Continue diuresis with furosemide 80 mg IV q12 and SGLT 2 inh.   Blood pressure control with metoprolol and hydralazine/ isosorbide.    Acute renal failure superimposed on stage 3b chronic kidney disease (HCC) Hypocalcemia and  hypomagnesemia.   Improving volume status but not back to baseline, renal function today with serum cr at 1,41 with K at 4,3 and serum bicarbonate at 30. Mg is 2,3 and Ca 8,8  Plan to continue diuresis with furosemide and SGLT 2 inh. Follow up renal function and electrolytes in am.   CAD (coronary artery disease) No chest pain, no acute coronary syndrome.  Continue with clopidogrel and statin therapy.   GERD (gastroesophageal reflux disease) Continue with pantoprazole.   History of CVA (cerebrovascular accident) No acute focal deficits Continue antiplatelet therapy with clopidogrel and statin with atorvastatin.  Continue blood pressure control.    Primary hypertension Continue blood pressure control with hydralazine, isosorbide and metoprolol.   Type 2 diabetes mellitus with hyperlipidemia (HCC) Uncontrolled hyperglycemia.  Fasting glucose this am is 203 mg/dl Capillary glucose 063, 194 and 247,   Plan to continue insulin sliding scale for glucose cover and monitoring Continue with basal insulin 20 units.   Continue with statin therapy.   Vision loss of left eye Patient with chronic left eye vision loss.  Previously empirically started on steroid for concerns of temporal arthritis.  However had biopsy on 6/13 with diagnosis ruled out.  Currently undergoing long taper of her prednisone. -No med rec from rehab on presentation.  Attempted to call rehab without answer.  Last known to be down to 20 mg on 8/7.  \  Decrease dose to 5 mg of prednisone.   Vitamin D deficiency Continue q7 weekly vitamin D pending med rec from rehab for timing of last dose  Class 2 obesity Calculated BMI 39.0  Subjective: Patient is feeling better, dyspnea and edema improving but not back to baseline, cramps at her lower extremities have improved.   Physical Exam: Vitals:   08/15/22 0024 08/15/22 0548 08/15/22 0810 08/15/22 0851  BP: (!) 157/90 (!) 145/92 (!) 177/105 (!) 170/84   Pulse: 100 100 (!) 132 (!) 113  Resp: 13 13 17    Temp: 98.6 F (37 C) 98.6 F (37 C) 99 F (37.2 C)   TempSrc: Oral Oral Oral   SpO2:   100%   Weight:  96.9 kg    Height:       Neurology awake and alert ENT with mild pallor Cardiovascular with S1 and S2 present and rhythmic with no gallops, rubs or murmurs No JVD Positive lower extremity edema ++ pitting Respiratory with rales at bases with no wheezing Abdomen with no distention  Data Reviewed:    Family Communication: no family at the bedside   Disposition: Status is: Inpatient Remains inpatient appropriate because: heart failure   Planned Discharge Destination: Skilled nursing facility      Author: , MD 08/15/2022 12:45 PM  For on call review [email or web address].

## 2022-08-16 DIAGNOSIS — E785 Hyperlipidemia, unspecified: Secondary | ICD-10-CM

## 2022-08-16 DIAGNOSIS — N179 Acute kidney failure, unspecified: Secondary | ICD-10-CM | POA: Diagnosis not present

## 2022-08-16 DIAGNOSIS — I2510 Atherosclerotic heart disease of native coronary artery without angina pectoris: Secondary | ICD-10-CM | POA: Diagnosis not present

## 2022-08-16 DIAGNOSIS — K219 Gastro-esophageal reflux disease without esophagitis: Secondary | ICD-10-CM | POA: Diagnosis not present

## 2022-08-16 DIAGNOSIS — I5021 Acute systolic (congestive) heart failure: Secondary | ICD-10-CM | POA: Diagnosis not present

## 2022-08-16 LAB — GLUCOSE, CAPILLARY
Glucose-Capillary: 232 mg/dL — ABNORMAL HIGH (ref 70–99)
Glucose-Capillary: 229 mg/dL — ABNORMAL HIGH (ref 70–99)
Glucose-Capillary: 230 mg/dL — ABNORMAL HIGH (ref 70–99)
Glucose-Capillary: 356 mg/dL — ABNORMAL HIGH (ref 70–99)

## 2022-08-16 LAB — BASIC METABOLIC PANEL
Anion gap: 10 (ref 5–15)
BUN: 33 mg/dL — ABNORMAL HIGH (ref 6–20)
CO2: 31 mmol/L (ref 22–32)
Calcium: 8.9 mg/dL (ref 8.9–10.3)
Chloride: 98 mmol/L (ref 98–111)
Creatinine, Ser: 1.33 mg/dL — ABNORMAL HIGH (ref 0.44–1.00)
GFR, Estimated: 46 mL/min — ABNORMAL LOW (ref >60)
Glucose, Bld: 242 mg/dL — ABNORMAL HIGH (ref 70–99)
Potassium: 4.3 mmol/L (ref 3.5–5.1)
Sodium: 139 mmol/L (ref 135–145)

## 2022-08-16 MED ORDER — METOPROLOL TARTRATE 5 MG/5ML IV SOLN
5.0000 mg | INTRAVENOUS | Status: DC | PRN
Start: 2022-08-16 — End: 2022-08-22
  Administered 2022-08-16 – 2022-08-17 (×5): 5 mg via INTRAVENOUS
  Filled 2022-08-16 (×5): qty 5

## 2022-08-16 MED ORDER — INSULIN DETEMIR 100 UNIT/ML ~~LOC~~ SOLN
25.0000 [IU] | Freq: Every day | SUBCUTANEOUS | Status: DC
  Administered 2022-08-17 – 2022-08-19 (×3): 25 [IU] via SUBCUTANEOUS
  Filled 2022-08-16 (×3): qty 0.25

## 2022-08-16 MED ORDER — INSULIN DETEMIR 100 UNIT/ML ~~LOC~~ SOLN
5.0000 [IU] | Freq: Once | SUBCUTANEOUS | Status: AC
  Administered 2022-08-16: 5 [IU] via SUBCUTANEOUS
  Filled 2022-08-16: qty 0.05

## 2022-08-16 MED ORDER — TIZANIDINE HCL 4 MG PO TABS
2.0000 mg | ORAL_TABLET | Freq: Four times a day (QID) | ORAL | Status: DC | PRN
Start: 2022-08-16 — End: 2022-08-22
  Administered 2022-08-17 – 2022-08-21 (×3): 2 mg via ORAL
  Filled 2022-08-16 (×5): qty 1

## 2022-08-16 MED ORDER — HYDRALAZINE HCL 50 MG PO TABS
50.0000 mg | ORAL_TABLET | Freq: Three times a day (TID) | ORAL | Status: DC
  Administered 2022-08-16 – 2022-08-18 (×7): 50 mg via ORAL
  Filled 2022-08-16 (×7): qty 1

## 2022-08-16 MED ORDER — TRAZODONE HCL 50 MG PO TABS
150.0000 mg | ORAL_TABLET | Freq: Every day | ORAL | Status: DC
  Administered 2022-08-16 – 2022-08-21 (×6): 150 mg via ORAL
  Filled 2022-08-16 (×6): qty 1

## 2022-08-16 MED ORDER — METOPROLOL SUCCINATE ER 50 MG PO TB24
50.0000 mg | ORAL_TABLET | Freq: Every day | ORAL | Status: DC
  Administered 2022-08-17: 50 mg via ORAL
  Filled 2022-08-16: qty 1

## 2022-08-16 MED ORDER — METOPROLOL SUCCINATE ER 25 MG PO TB24
25.0000 mg | ORAL_TABLET | Freq: Once | ORAL | Status: AC
  Administered 2022-08-16: 25 mg via ORAL
  Filled 2022-08-16: qty 1

## 2022-08-16 NOTE — Plan of Care (Signed)
  Problem: Education: Goal: Ability to demonstrate management of disease process will improve Outcome: Progressing   Problem: Education: Goal: Ability to verbalize understanding of medication therapies will improve Outcome: Progressing   Problem: Activity: Goal: Capacity to carry out activities will improve Outcome: Progressing   Problem: Cardiac: Goal: Ability to achieve and maintain adequate cardiopulmonary perfusion will improve Outcome: Progressing   Problem: Health Behavior/Discharge Planning: Goal: Ability to manage health-related needs will improve Outcome: Progressing   Problem: Clinical Measurements: Goal: Respiratory complications will improve Outcome: Progressing   Problem: Pain Managment: Goal: General experience of comfort will improve Outcome: Progressing   Problem: Safety: Goal: Ability to remain free from injury will improve Outcome: Progressing   Problem: Skin Integrity: Goal: Risk for impaired skin integrity will decrease Outcome: Progressing

## 2022-08-16 NOTE — Progress Notes (Signed)
Progress Note   Patient: Erin Jackson [id number]:[id number] DOB: 1966-01-01 DOA: 08/13/2022     2 DOS: the patient was seen and examined on 08/16/2022   Brief hospital course: Erin Jackson was admitted to the hospital with the working diagnosis of heart failure decompensation.   56 yo female with the past medical history of heart failure, CVA, CAD, hypertension, CKD stage 3b and T2DM who presented with dyspnea. Reported 2 weeks of worsening dyspnea, associated with lower extremity edema.  Recent hospitalization 07/20/22 to 07/27/22 for hypocalcemia due to vitamin D deficiency and hypomagnesemia, hospitalization complicated with aspiration pneumonia and was discharged to SNF.  On her initial physical examination her blood pressure was 160/113, HR 122, RR 22, 02 saturation 99%, lungs with no wheezing or rhonchi, heart with S1 and S2 present with no gallops, abdomen not distended and positive lower extremity edema +++ pitting.   Na 138, K 4,3 CL 101, bicarbonate 25, glucose 193, bun 25 cr 1,54  Mg <0.5  Ca 7.6  BNP 63,8  Wbc 11.8, hgb 10.7 plt 245  Chest radiograph with mild hilar vascular congestion bilaterally.   EKG 126 bpm, left axis, left anterior fascicular block, sinus rhythm with no significant ST segment or T wave changes.   Patient has been placed on furosemide for diuresis.  Mag supplementation.   08/27 patient diuresing well, this am with hypertension and sinus tachycardia.   Assessment and Plan: * Acute systolic CHF (congestive heart failure) (HCC) Echocardiogram with mild reduction in systolic function 40 to 45%. Global hypokinesis. RV systolic function is preserved. Pericardial effusion circumferential, no significant valvular disease.   Documented urine output is 3,400 ml Systolic blood pressure is 180/100 mmHg.   Plan to continue with furosemide 80 mg IV q12 and empagliflozin.  Increase metoprolol succinate to 50 mg and hydralazine to 50 mg tid, continue with isosorbide 20 mg  tid.  Continue telemetry monitoring.     Acute renal failure superimposed on stage 3b chronic kidney disease (HCC) Hypocalcemia and hypomagnesemia.   Patient is responding to diuresis, her renal function today has a serum cr of 1,3 with K at 4,3 and serum bicarbonate at 31.   Plan to continue diuresis with furosemide and SGLT 2 inh. Check electrolytes and renal function in am.   CAD (coronary artery disease) No chest pain, no acute coronary syndrome.  Continue with clopidogrel and statin therapy.   GERD (gastroesophageal reflux disease) Continue with pantoprazole.   History of CVA (cerebrovascular accident) No acute focal deficits Continue antiplatelet therapy with clopidogrel and statin with atorvastatin.  Continue blood pressure control.    Primary hypertension Patient with uncontrolled hypertension, will increase dose of metoprolol and hydralazine, continue isosorbide and diuresis with IV furosemide/ empagliflozin.   Type 2 diabetes mellitus with hyperlipidemia (HCC) Uncontrolled hyperglycemia.  Fasting glucose this am is 242 mg/dl Capillary glucose 102, 365, 316  Patient used 19 units yesterday of short acting insulin.  Plan to increase basal insulin to 25 units and continue sliding scale for glucose cover and monitoring.   Continue with statin therapy.   Vision loss of left eye Patient with chronic left eye vision loss.  Previously empirically started on steroid for concerns of temporal arthritis.  However had biopsy on 6/13 with diagnosis ruled out.  Currently undergoing long taper of her prednisone. -No med rec from rehab on presentation.  Attempted to call rehab without answer.  Last known to be down to 20 mg on 8/7.  \  Decrease dose  to 5 mg of prednisone.   Vitamin D deficiency Continue q7 weekly vitamin D pending med rec from rehab for timing of last dose  Class 2 obesity Calculated BMI 39.0         Subjective: Patient with persistent edema but  improving, no dyspnea or anxiety. Her blood pressure and heart rate are elevated this morning.   Physical Exam: Vitals:   08/16/22 0019 08/16/22 0022 08/16/22 0539 08/16/22 0700  BP: (!) 154/116 (!) 164/96 (!) 151/104 (!) 180/100  Pulse: (!) 131 (!) 123 (!) 123 (!) 127  Resp: (!) 29 18 18    Temp: 99 F (37.2 C) 99 F (37.2 C) 98.3 F (36.8 C)   TempSrc: Oral  Oral   SpO2: 95% 96% 95%   Weight:   93.8 kg   Height:       Neurology awake and alert ENT with mild pallor Cardiovascular with S1 and S2 present and tachycardic regular with no gallops or murmurs Respiratory with no rales or wheezing Abdomen with no distention  Positive lower extremity edema ++ pitting bilaterally, warm lower extremities.  Data Reviewed:    Family Communication: no family at the bedside   Disposition: Status is: Inpatient Remains inpatient appropriate because: heart failure   Planned Discharge Destination: Skilled nursing facility    Author: , MD 08/16/2022 8:41 AM  For on call review [email or web address].

## 2022-08-17 DIAGNOSIS — N179 Acute kidney failure, unspecified: Secondary | ICD-10-CM | POA: Diagnosis not present

## 2022-08-17 DIAGNOSIS — K219 Gastro-esophageal reflux disease without esophagitis: Secondary | ICD-10-CM | POA: Diagnosis not present

## 2022-08-17 DIAGNOSIS — E1169 Type 2 diabetes mellitus with other specified complication: Secondary | ICD-10-CM | POA: Diagnosis not present

## 2022-08-17 DIAGNOSIS — I5021 Acute systolic (congestive) heart failure: Secondary | ICD-10-CM | POA: Diagnosis not present

## 2022-08-17 DIAGNOSIS — F419 Anxiety disorder, unspecified: Secondary | ICD-10-CM

## 2022-08-17 LAB — BASIC METABOLIC PANEL
Anion gap: 13 (ref 5–15)
BUN: 35 mg/dL — ABNORMAL HIGH (ref 6–20)
CO2: 31 mmol/L (ref 22–32)
Calcium: 9.6 mg/dL (ref 8.9–10.3)
Chloride: 94 mmol/L — ABNORMAL LOW (ref 98–111)
Creatinine, Ser: 1.49 mg/dL — ABNORMAL HIGH (ref 0.44–1.00)
GFR, Estimated: 40 mL/min — ABNORMAL LOW (ref >60)
Glucose, Bld: 232 mg/dL — ABNORMAL HIGH (ref 70–99)
Potassium: 4.4 mmol/L (ref 3.5–5.1)
Sodium: 138 mmol/L (ref 135–145)

## 2022-08-17 LAB — GLUCOSE, CAPILLARY
Glucose-Capillary: 235 mg/dL — ABNORMAL HIGH (ref 70–99)
Glucose-Capillary: 270 mg/dL — ABNORMAL HIGH (ref 70–99)
Glucose-Capillary: 397 mg/dL — ABNORMAL HIGH (ref 70–99)
Glucose-Capillary: 364 mg/dL — ABNORMAL HIGH (ref 70–99)

## 2022-08-17 LAB — MAGNESIUM: Magnesium: 1.3 mg/dL — ABNORMAL LOW (ref 1.7–2.4)

## 2022-08-17 MED ORDER — ALPRAZOLAM 0.5 MG PO TABS: 2.0000 mg | ORAL_TABLET | Freq: Three times a day (TID) | ORAL | Status: DC

## 2022-08-17 MED ORDER — ALPRAZOLAM 0.5 MG PO TABS
1.0000 mg | ORAL_TABLET | Freq: Three times a day (TID) | ORAL | Status: DC
  Administered 2022-08-17 – 2022-08-19 (×6): 1 mg via ORAL
  Filled 2022-08-17 (×6): qty 2

## 2022-08-17 MED ORDER — METOPROLOL SUCCINATE ER 50 MG PO TB24
75.0000 mg | ORAL_TABLET | Freq: Every day | ORAL | Status: DC
  Administered 2022-08-18 – 2022-08-19 (×2): 75 mg via ORAL
  Filled 2022-08-17 (×2): qty 1

## 2022-08-17 MED ORDER — METOPROLOL SUCCINATE ER 25 MG PO TB24
25.0000 mg | ORAL_TABLET | Freq: Once | ORAL | Status: AC
  Administered 2022-08-17: 25 mg via ORAL
  Filled 2022-08-17: qty 1

## 2022-08-17 MED ORDER — MAGNESIUM SULFATE 2 GM/50ML IV SOLN
2.0000 g | Freq: Once | INTRAVENOUS | Status: AC
  Administered 2022-08-17: 2 g via INTRAVENOUS
  Filled 2022-08-17: qty 50

## 2022-08-17 NOTE — Progress Notes (Signed)
Progress Note   Patient: Erin Jackson [id number]:[id number] DOB: 01/22/1966 DOA: 08/13/2022     3 DOS: the patient was seen and examined on 08/17/2022   Brief hospital course: Mrs. Hoch was admitted to the hospital with the working diagnosis of heart failure decompensation.   56 yo female with the past medical history of heart failure, CVA, CAD, hypertension, CKD stage 3b and T2DM who presented with dyspnea. Reported 2 weeks of worsening dyspnea, associated with lower extremity edema.  Recent hospitalization 07/20/22 to 07/27/22 for hypocalcemia due to vitamin D deficiency and hypomagnesemia, hospitalization complicated with aspiration pneumonia and was discharged to SNF.  On her initial physical examination her blood pressure was 160/113, HR 122, RR 22, 02 saturation 99%, lungs with no wheezing or rhonchi, heart with S1 and S2 present with no gallops, abdomen not distended and positive lower extremity edema +++ pitting.   Na 138, K 4,3 CL 101, bicarbonate 25, glucose 193, bun 25 cr 1,54  Mg <0.5  Ca 7.6  BNP 63,8  Wbc 11.8, hgb 10.7 plt 245  Chest radiograph with mild hilar vascular congestion bilaterally.   EKG 126 bpm, left axis, left anterior fascicular block, sinus rhythm with no significant ST segment or T wave changes.   Patient has been placed on furosemide for diuresis.  Mag supplementation.   08/27 patient diuresing well, this am with hypertension and sinus tachycardia.  08/28 her volume status continue to improve, continue with sinus tachycardia.   Assessment and Plan: * Acute systolic CHF (congestive heart failure) (HCC) Echocardiogram with mild reduction in systolic function 40 to 45%. Global hypokinesis. RV systolic function is preserved. Pericardial effusion circumferential, no significant valvular disease.   Documented urine output is 5,500 ml Systolic blood pressure is 140 to 170 mmHg and diastolic in the 90 to 100   Plan to continue with furosemide 80 mg IV q12 and  empagliflozin.  Sinus tachycardia Increase metoprolol succinate to 75 mg and continue with hydralazine to 50 mg tid plus isosorbide 20 mg tid.  Continue telemetry monitoring.     Acute renal failure superimposed on stage 3b chronic kidney disease (HCC) Hypocalcemia and hypomagnesemia.   Renal function with serum cr at 1,49 with K at 4,4 and serum bicarbonate at 31. Mg is 1,3   Plan to add 2 g mag IV and continue diuresis with furosemide and SGLT 2 inh.   Continue close follow up renal function and electrolytes.  Avoid hypotension and nephrotoxic medications.   CAD (coronary artery disease) No chest pain, no acute coronary syndrome.  Continue with clopidogrel and statin therapy.   GERD (gastroesophageal reflux disease) Continue with pantoprazole.   History of CVA (cerebrovascular accident) No acute focal deficits Continue antiplatelet therapy with clopidogrel and statin with atorvastatin.  Continue blood pressure control.    Primary hypertension Uncontrolled hypertension  Continue diuresis with furosemide Increase metoprolol to 75 mg daily, continue with hydralazine and isosorbide No RAS inhibition due to reduced GFR.   Type 2 diabetes mellitus with hyperlipidemia (HCC) Uncontrolled hyperglycemia.  Fasting glucose this am is 232 mg/dl  Increase basal insulin to 30 units and continue sliding scale for glucose cover and monitoring.  Taper steroids.   Continue with statin therapy.   Vision loss of left eye Patient with chronic left eye vision loss.  Previously empirically started on steroid for concerns of temporal arthritis.  However had biopsy on 6/13 with diagnosis ruled out.  Currently undergoing long taper of her prednisone. -No med rec from  rehab on presentation.  Attempted to call rehab without answer.  Last known to be down to 20 mg on 8/7.  \  Decrease dose to 5 mg of prednisone.   Anxiety At home patient taking alprazolam tid, will change to tid dosing,  possible withdrawal driving tachycardia.   Vitamin D deficiency Continue q7 weekly vitamin D pending med rec from rehab for timing of last dose  Class 2 obesity Calculated BMI 39.0         Subjective: Patient with improvement in edema and dyspnea, has been ambulating with assistance. Continue to be tachycardic.   Physical Exam: Vitals:   08/17/22 0426 08/17/22 0536 08/17/22 0623 08/17/22 0625  BP: (!) 185/107 (!) 184/105 (!) 170/90 (!) 167/109  Pulse: (!) 120     Resp: 20   19  Temp: 97.8 F (36.6 C)     TempSrc: Oral     SpO2: 96%     Weight:  91.9 kg    Height:       Neurology awake and alert ENT with mild pallor Cardiovascular with S1 and S2 present and tachycardic with no gallops or murmurs No JVD Positive lower extremity edema pitting ++ bilaterally Abdomen not distended Data Reviewed:    Family Communication: no family at the bedside   Disposition: Status is: Inpatient Remains inpatient appropriate because: heart failure   Planned Discharge Destination: Home     Author: Coralie Keens, MD 08/17/2022 3:48 PM  For on call review [email or web address].

## 2022-08-17 NOTE — Assessment & Plan Note (Addendum)
At home patient taking alprazolam tid, will change to tid dosing, possible withdrawal driving tachycardia.

## 2022-08-17 NOTE — Inpatient Diabetes Management (Signed)
Inpatient Diabetes Program Recommendations  AACE/ADA: New Consensus Statement on Inpatient Glycemic Control (2015)  Target Ranges:  Prepandial:   less than 140 mg/dL      Peak postprandial:   less than 180 mg/dL (1-2 hours)      Critically ill patients:  140 - 180 mg/dL   Lab Results  Component Value Date   GLUCAP 235 (H) 08/17/2022   HGBA1C 6.8 (H) 04/22/2022    Review of Glycemic Control  Latest Reference Range & Units 08/16/22 05:57 08/16/22 11:48 08/16/22 16:38 08/16/22 19:49 08/17/22 06:20  Glucose-Capillary 70 - 99 mg/dL 371 (H) 696 (H) 789 (H) 356 (H) 235 (H)   Diabetes history: DM 2 Outpatient Diabetes medications: Levemir 20 units qhs, Humalog 2-12 units tid, Metformin 500 mg bid Current orders for Inpatient glycemic control:  Levemir 25 units Daily Novolog 0-15 units tid Jardiance 10 mg Daily PO Prednisone 5 mg Daily  Inpatient Diabetes Program Recommendations:    -   Increase Levemir to 30 units -   Consider adding Novolog 4 units tid meal coverage if eating >50% of meals  Thanks,  Christena Deem RN, MSN, BC-ADM Inpatient Diabetes Coordinator Team Pager [phone] (8a-5p)

## 2022-08-17 NOTE — Progress Notes (Signed)
Mobility Specialist Progress Note:   08/17/22 1529  Mobility  Activity Ambulated with assistance in hallway  Level of Assistance Contact guard assist, steadying assist  Assistive Device Front wheel walker  Distance Ambulated (ft) 120 ft  Activity Response Tolerated well  $Mobility charge 1 Mobility   Pre- Mobility: 125 HR During Mobility: 140 HR Post Mobility:  130 HR  Pt received in bed willing to participate in mobility. Complaints of leg soreness. Left in bed with call bell in reach and all needs met.   [facility] Ambriel Gorelick Mobility Specialist

## 2022-08-17 NOTE — Progress Notes (Signed)
Mobility Specialist Progress Note:   08/17/22 1408  Mobility  Activity Refused mobility   Pt in bed requesting I come back at 3:00 pm. Will follow-up as time allows.   [facility] Biridiana Twardowski Mobility Specialist

## 2022-08-17 NOTE — TOC Initial Note (Signed)
Transition of Care [facility]) - Initial/Assessment Note    Patient Details  Name: Erin Jackson MRN: [id number] Date of Birth: 1966/04/25  Transition of Care [facility]) CM/SW Contact:    Carley Hammed, LCSWA Phone Number: 08/17/2022, 10:47 AM  Clinical Narrative:                 CSW notified pt admitted from [facility]. Per Faythe Casa, pt was discharged the day she came to the ED for hospital admission. Wadie Lessen will need to determine if pt returning is appropriate. Will wait for PT/ OT evaluations to determine disposition. TOC will continue to follow for DC needs.  Expected Discharge Plan:  (TBD) Barriers to Discharge: Continued Medical Work up, Other (must enter comment) (Disposition)   Patient Goals and CMS Choice   CMS Medicare.gov Compare Post Acute Care list provided to:: Patient Choice offered to / list presented to : Patient  Expected Discharge Plan and Services Expected Discharge Plan:  (TBD)       Living arrangements for the past 2 months: Skilled Nursing Facility                                      Prior Living Arrangements/Services Living arrangements for the past 2 months: Skilled Nursing Facility Lives with:: Facility Resident Patient language and need for interpreter reviewed:: Yes Do you feel safe going back to the place where you live?: Yes      Need for Family Participation in Patient Care: No (Comment) Care giver support system in place?: No (comment)   Criminal Activity/Legal Involvement Pertinent to Current Situation/Hospitalization: No - Comment as needed  Activities of Daily Living Home Assistive Devices/Equipment: Wheelchair, Environmental consultant (specify type) ADL Screening (condition at time of admission) Patient's cognitive ability adequate to safely complete daily activities?: Yes Is the patient deaf or have difficulty hearing?: No (Blind in left eye) Does the patient have difficulty seeing, even when wearing glasses/contacts?: Yes Does the patient  have difficulty concentrating, remembering, or making decisions?: No Patient able to express need for assistance with ADLs?: Yes Does the patient have difficulty dressing or bathing?: Yes Independently performs ADLs?: No Communication: Independent Dressing (OT): Needs assistance Is this a change from baseline?: Pre-admission baseline Grooming: Needs assistance Is this a change from baseline?: Pre-admission baseline Feeding: Independent Bathing: Needs assistance Is this a change from baseline?: Pre-admission baseline Toileting: Needs assistance Is this a change from baseline?: Pre-admission baseline In/Out Bed: Independent Walks in Home: Needs assistance Is this a change from baseline?: Pre-admission baseline Does the patient have difficulty walking or climbing stairs?: Yes Weakness of Legs: Both Weakness of Arms/Hands: None  Permission Sought/Granted                  Emotional Assessment              Admission diagnosis:  Hypocalcemia [E83.51] Hypomagnesemia [E83.42] Hypervolemia, unspecified hypervolemia type [E87.70] Heart failure (HCC) [I50.9] Patient Active Problem List   Diagnosis Date Noted   Class 2 obesity 08/15/2022   Acute renal failure superimposed on stage 3b chronic kidney disease (HCC) 08/13/2022   Vitamin D deficiency 08/13/2022   Iron deficiency anemia 07/22/2022   History of CVA (cerebrovascular accident) 07/22/2022   Debility 07/22/2022   Fall at home 07/22/2022   Hypoalbuminemia 07/22/2022   Obesity (BMI 30-39.9) 07/22/2022   Vision loss of left eye 06/01/2022   Temporal arteritis (HCC) 06/01/2022  Embolic stroke (HCC) 04/29/2022   Stage 3b chronic kidney disease (HCC)    Chronic systolic heart failure (HCC)    Cerebral embolism with cerebral infarction 04/22/2022   Hypertensive urgency 04/21/2022   Altered mental status    Dizziness    Nausea    CAD (coronary artery disease) 04/10/2022   PVD (peripheral vascular disease) (HCC)  04/10/2022   Ischemic cardiomyopathy 04/10/2022   Acute systolic CHF (congestive heart failure) (HCC) 04/10/2022   Allergic reaction 04/08/2022   Angina at rest [facility]) 04/08/2022   Osteoarthritis of carpometacarpal [facility]) joint of right thumb 06/18/2020   Primary osteoarthritis of first carpometacarpal joint of right hand 11/09/2019   Right carpal tunnel syndrome 11/09/2019   De Quervain's tenosynovitis, right 11/09/2019   Arthritis of carpometacarpal (CMC) joints of both thumbs 11/04/2017   Chronic neck pain 08/20/2017   Supraclavicular mass 08/20/2017   Primary hypertension 08/20/2017   Type 2 diabetes mellitus with hyperlipidemia (HCC) 08/20/2017   GERD (gastroesophageal reflux disease) 08/20/2017   Cigarette nicotine dependence without complication 08/20/2017   PCP:  [facility]:  No Pharmacies Listed    Social Determinants of Health (SDOH) Interventions    Readmission Risk Interventions     No data to display

## 2022-08-17 NOTE — Consult Note (Addendum)
   [facility] Inpatient Consult   08/17/2022  ELLY HAFFEY 02/04/1966 [id number]  THN Review:  on [facility] High Risk and less than 30 days readmission  Primary Care Provider: [facility] [place] Medicare  1410: Met with the patient at the bedside, patient was napping on rounds. She answered to her name, HIPAA verified. Explained the reason for visit round and patient confirms, "I see Dr. Raliegh Ip at [facility]."   Patient was reviewed for extreme high risk for unplanned readmission score with the [place] banner. Provider is not a [facility] provider.   Checked information in [facility] rosters and patient had never seen primary care listed in the [facility], [place] and it list [place] shows [facility] activity.   Not currently eligible for [facility] Care Coordination.   Reason:   Patient's primary care provider is not an in network provider  with [place] at this time. Membership roster was used to verify patient status had not seen the [facility] PCP listed.   For additional questions or referrals please contact:    For questions, please call:  Natividad Brood, RN BSN [facility] Liaison  [phone] business mobile phone Toll free office [phone]  Fax number: [phone] [email or web address]

## 2022-08-18 DIAGNOSIS — N179 Acute kidney failure, unspecified: Secondary | ICD-10-CM | POA: Diagnosis not present

## 2022-08-18 DIAGNOSIS — I10 Essential (primary) hypertension: Secondary | ICD-10-CM | POA: Diagnosis not present

## 2022-08-18 DIAGNOSIS — I5021 Acute systolic (congestive) heart failure: Secondary | ICD-10-CM | POA: Diagnosis not present

## 2022-08-18 DIAGNOSIS — K219 Gastro-esophageal reflux disease without esophagitis: Secondary | ICD-10-CM | POA: Diagnosis not present

## 2022-08-18 LAB — BASIC METABOLIC PANEL
Anion gap: 12 (ref 5–15)
BUN: 39 mg/dL — ABNORMAL HIGH (ref 6–20)
CO2: 29 mmol/L (ref 22–32)
Calcium: 9.6 mg/dL (ref 8.9–10.3)
Chloride: 94 mmol/L — ABNORMAL LOW (ref 98–111)
Creatinine, Ser: 1.62 mg/dL — ABNORMAL HIGH (ref 0.44–1.00)
GFR, Estimated: 37 mL/min — ABNORMAL LOW (ref >60)
Glucose, Bld: 255 mg/dL — ABNORMAL HIGH (ref 70–99)
Potassium: 4.4 mmol/L (ref 3.5–5.1)
Sodium: 135 mmol/L (ref 135–145)

## 2022-08-18 LAB — GLUCOSE, CAPILLARY
Glucose-Capillary: 272 mg/dL — ABNORMAL HIGH (ref 70–99)
Glucose-Capillary: 339 mg/dL — ABNORMAL HIGH (ref 70–99)
Glucose-Capillary: 339 mg/dL — ABNORMAL HIGH (ref 70–99)
Glucose-Capillary: 142 mg/dL — ABNORMAL HIGH (ref 70–99)

## 2022-08-18 LAB — MAGNESIUM: Magnesium: 1.9 mg/dL (ref 1.7–2.4)

## 2022-08-18 MED ORDER — INSULIN ASPART 100 UNIT/ML IJ SOLN: 3.0000 [IU] | Freq: Three times a day (TID) | INTRAMUSCULAR | Status: DC

## 2022-08-18 MED ORDER — CLONIDINE HCL 0.1 MG PO TABS
0.1000 mg | ORAL_TABLET | Freq: Two times a day (BID) | ORAL | Status: DC
  Administered 2022-08-19 (×2): 0.1 mg via ORAL
  Filled 2022-08-18 (×4): qty 1

## 2022-08-18 MED ORDER — INSULIN ASPART 100 UNIT/ML IJ SOLN
4.0000 [IU] | Freq: Three times a day (TID) | INTRAMUSCULAR | Status: DC
  Administered 2022-08-18 – 2022-08-19 (×4): 4 [IU] via SUBCUTANEOUS

## 2022-08-18 MED ORDER — HYDRALAZINE HCL 10 MG PO TABS
10.0000 mg | ORAL_TABLET | Freq: Three times a day (TID) | ORAL | Status: DC
  Administered 2022-08-18 – 2022-08-19 (×2): 10 mg via ORAL
  Filled 2022-08-18 (×2): qty 1

## 2022-08-18 NOTE — Evaluation (Signed)
Physical Therapy Evaluation Patient Details Name: Erin Jackson MRN: [id number] DOB: 1966/03/28 Today's Date: 08/18/2022  History of Present Illness  Patient is 56 y.o. female present to [facility] with 2 weeks of worsening dyspnea, associated with lower extremity edema. Pt with recent hospitalization 07/20/22 to 07/27/22 for hypocalcemia due to vitamin D deficiency and hypomagnesemia; hospitalization complicated with aspiration pneumonia and was discharged to SNF. PMH significant for heart failure, CVA, CAD, hypertension, CKD stage 3b and T2DM who presented with dyspnea.   Clinical Impression  Erin Jackson is 56 y.o. female admitted with above HPI and diagnosis. Patient is currently limited by functional impairments below (see PT problem list). Patient lives alone and requires rollator for mobility at baseline. She presented from rehab and has no assist available at home currently. Patient will benefit from continued skilled PT interventions to address impairments and progress independence with mobility, recommending return to SNF for ST rehab prior to return home. Pt will likely need home aide when ready to return home. Acute PT will follow and progress as able.        Recommendations for follow up therapy are one component of a multi-disciplinary discharge planning process, led by the attending physician.  Recommendations may be updated based on patient status, additional functional criteria and insurance authorization.  Follow Up Recommendations Skilled nursing-short term rehab (<3 hours/day) Can patient physically be transported by private vehicle: Yes    Assistance Recommended at Discharge Intermittent Supervision/Assistance  Patient can return home with the following  A little help with walking and/or transfers;A little help with bathing/dressing/bathroom;Help with stairs or ramp for entrance;Assist for transportation;Assistance with cooking/housework    Equipment Recommendations None  recommended by PT  Recommendations for Other Services       Functional Status Assessment Patient has had a recent decline in their functional status and demonstrates the ability to make significant improvements in function in a reasonable and predictable amount of time.     Precautions / Restrictions Precautions Precautions: Fall Restrictions Weight Bearing Restrictions: No      Mobility  Bed Mobility Overal bed mobility: Needs Assistance Bed Mobility: Sit to Supine       Sit to supine: Min guard   General bed mobility comments: pt able to raise bil LE's onto bed with guarding. required use of bed rail and extra time.    Transfers Overall transfer level: Needs assistance Equipment used: Rollator (4 wheels) Transfers: Sit to/from Stand Sit to Stand: Min guard           General transfer comment: guarding for safety with power up from recliner. cues for hand placement on rollator and to lock brakes prior to standing.    Ambulation/Gait Ambulation/Gait assistance: Min guard, Min assist Gait Distance (Feet): 170 Feet Assistive device: Rollator (4 wheels) Gait Pattern/deviations: Step-through pattern, Decreased stride length, Shuffle, Wide base of support Gait velocity: decr     General Gait Details: cues to scan environment on Lt as pt is blind in Lt eye. Min assist at start to steady and keep rollator in space proximity, pt progressed to min guard. HR in 120's-130's with gait. No LOB noted.  Stairs            Wheelchair Mobility    Modified Rankin (Stroke Patients Only)       Balance Overall balance assessment: Needs assistance Sitting-balance support: Feet supported, No upper extremity supported Sitting balance-Leahy Scale: Good Sitting balance - Comments: sits unsupported   Standing balance support: Bilateral upper extremity  supported, During functional activity Standing balance-Leahy Scale: Fair Standing balance comment: rollator for gait                              Pertinent Vitals/Pain Pain Assessment Pain Assessment: No/denies pain Pain Intervention(s): Monitored during session    Home Living Family/patient expects to be discharged to:: Private residence Living Arrangements: Alone Available Help at Discharge: Family;Available PRN/intermittently Type of Home: Apartment Home Access: Stairs to enter Entrance Stairs-Rails: None Entrance Stairs-Number of Steps: 3   Home Layout: One level Home Equipment: Wheelchair - Forensic psychologist (2 wheels);Tub bench Additional Comments: patient had a stroke earlier this year and completed inpatient rehab after hospital discharge. has been in/out of rehab thorughout the last year.    Prior Function Prior Level of Function : Independent/Modified Independent               ADLs Comments: pt reports supposed to have aid m-f     Hand Dominance   Dominant Hand: Right    Extremity/Trunk Assessment   Upper Extremity Assessment Upper Extremity Assessment: Overall WFL for tasks assessed    Lower Extremity Assessment Lower Extremity Assessment: Overall WFL for tasks assessed;RLE deficits/detail;LLE deficits/detail    Cervical / Trunk Assessment Cervical / Trunk Assessment: Other exceptions;Normal Cervical / Trunk Exceptions: habitus  Communication   Communication: No difficulties  Cognition Arousal/Alertness: Awake/alert Behavior During Therapy: Flat affect Overall Cognitive Status: No family/caregiver present to determine baseline cognitive functioning                                 General Comments: Flat, but following commands with increased time.        General Comments      Exercises     Assessment/Plan    PT Assessment Patient needs continued PT services  PT Problem List Decreased strength;Decreased range of motion;Decreased activity tolerance;Decreased balance;Decreased mobility;Decreased safety awareness       PT Treatment  Interventions DME instruction;Gait training;Stair training;Functional mobility training;Therapeutic exercise;Therapeutic activities;Balance training;Neuromuscular re-education;Patient/family education    PT Goals (Current goals can be found in the Care Plan section)  Acute Rehab PT Goals Patient Stated Goal: go to rehab PT Goal Formulation: With patient Time For Goal Achievement: 09/01/22 Potential to Achieve Goals: Good    Frequency Min 2X/week     Co-evaluation               AM-PAC PT "6 Clicks" Mobility  Outcome Measure Help needed turning from your back to your side while in a flat bed without using bedrails?: A Little Help needed moving from lying on your back to sitting on the side of a flat bed without using bedrails?: A Little Help needed moving to and from a bed to a chair (including a wheelchair)?: A Little Help needed standing up from a chair using your arms (e.g., wheelchair or bedside chair)?: A Little Help needed to walk in hospital room?: A Little Help needed climbing 3-5 steps with a railing? : A Lot 6 Click Score: 17    End of Session Equipment Utilized During Treatment: Gait belt Activity Tolerance: Patient tolerated treatment well Patient left: in bed;with call bell/phone within reach;with bed alarm set Nurse Communication: Mobility status PT Visit Diagnosis: Muscle weakness (generalized) (M62.81);Difficulty in walking, not elsewhere classified (R26.2)    Time: 1353-1430 PT Time Calculation (min) (ACUTE ONLY): 37 min  Charges:   PT Evaluation $PT Eval Low Complexity: 1 Low PT Treatments $Gait Training: 8-22 mins        Wynn Maudlin, DPT Acute Rehabilitation Services Office [phone] Pager [phone]  08/18/22 4:09 PM

## 2022-08-18 NOTE — Consult Note (Signed)
Referring Physician: Dr. Erin Hearing, MD  Erin Jackson is an 56 y.o. female.                       Chief Complaint: Sinus tachycardia  HPI: 56 years old black female with PMH of CHF, CVA, Obesity, CAD, HTN, CKD 3b and type 2 DM had decompensation of heart failure. Her EF on last visit was around 40-45 %. Cardiac cath had shown moderate disease. Patient admits to dietary non-compliance. She had very good diuresis with lasix use but has developed sinus tachycardia on high dose of hydralazine and discontinuation of clonidine.   Past Medical History:  Diagnosis Date   Anxiety    Chronic back pain    Diabetes mellitus without complication (HCC)    GERD (gastroesophageal reflux disease)    Hypertension       Past Surgical History:  Procedure Laterality Date   ABDOMINAL HYSTERECTOMY     ARTERY BIOPSY Left 06/02/2022   Procedure: BIOPSY TEMPORAL ARTERY;  Surgeon: Maeola Harman, MD;  Location: [facility] OR;  Service: Vascular;  Laterality: Left;   BIOPSY  07/25/2022   Procedure: BIOPSY;  Surgeon: Lynann Bologna, MD;  Location: [facility] ENDOSCOPY;  Service: Gastroenterology;;   BREAST LUMPECTOMY Left 03/25/2022   Procedure: LEFT BREAST LUMPECTOMY;  Surgeon: Griselda Miner, MD;  Location: [facility];  Service: General;  Laterality: Left;   BREAST REDUCTION SURGERY Bilateral 08/15/2018   Procedure: BILATERAL MAMMARY REDUCTION  (BREAST);  Surgeon: Louisa Second, MD;  Location: [facility];  Service: Plastics;  Laterality: Bilateral;   BREAST REDUCTION SURGERY Left 09/25/2019   Procedure: EXCISION OF FAT NECROSIS OF LEFT BREAST;  Surgeon: Louisa Second, MD;  Location: [facility];  Service: Plastics;  Laterality: Left;   CARPAL TUNNEL RELEASE Right 06/17/2020   right thumb CMC arthroplasty with tendon transfer and right carpal tunnel release.    CERVICAL SPINE SURGERY  2015   has a metal plate   ESOPHAGOGASTRODUODENOSCOPY N/A 07/25/2022   Procedure:  ESOPHAGOGASTRODUODENOSCOPY (EGD);  Surgeon: Lynann Bologna, MD;  Location: [facility] ENDOSCOPY;  Service: Gastroenterology;  Laterality: N/A;   FOOT SURGERY Left 2014   HAND SURGERY     LEFT HEART CATH AND CORONARY ANGIOGRAPHY N/A 04/09/2022   Procedure: LEFT HEART CATH AND CORONARY ANGIOGRAPHY;  Surgeon: Orpah Cobb, MD;  Location: [facility] LAB;  Service: Cardiovascular;  Laterality: N/A;   REDUCTION MAMMAPLASTY Bilateral 07/2018    Family History  Problem Relation Age of Onset   Breast cancer Maternal Grandmother    Breast cancer Maternal Aunt    Social History:  reports that she quit smoking about 3 months ago. Her smoking use included cigarettes. She smoked an average of .5 packs per day. She has never used smokeless tobacco. She reports current alcohol use. She reports that she does not currently use drugs after having used the following drugs: Marijuana.  Allergies:  Allergies  Allergen Reactions   Cucumber Extract Anaphylaxis   Molds & Smuts Shortness Of Breath   Norvasc [Amlodipine] Other (See Comments)    Muscle became very tight   Other Anaphylaxis and Other (See Comments)    Tree nuts, walnuts,  pickles   Peanut-Containing Drug Products Anaphylaxis   Apple Juice Hives   Aspirin Rash   Ibuprofen Rash   Niacin And Related Rash    Medications Prior to Admission  Medication Sig Dispense Refill   acetaminophen (TYLENOL) 325 MG tablet Take 2  tablets (650 mg total) by mouth every 4 (four) hours as needed for fever or mild pain.     alprazolam (XANAX) 2 MG tablet Take 0.5 tablets (1 mg total) by mouth 2 (two) times daily. 3 tablet 0   atorvastatin (LIPITOR) 80 MG tablet Take 1 tablet (80 mg total) by mouth daily. (Patient taking differently: Take 80 mg by mouth every evening.) 30 tablet 0   calcium carbonate (OS-CAL - DOSED IN MG OF ELEMENTAL CALCIUM) 1250 (500 Ca) MG tablet Take 1 tablet (1,250 mg total) by mouth 2 (two) times daily with a meal.     cetirizine (ZYRTEC ALLERGY)  10 MG tablet Take 1 tablet (10 mg total) by mouth daily. (Patient taking differently: Take 10 mg by mouth every morning.) 30 tablet 1   cloNIDine (CATAPRES) 0.1 MG tablet Take 2 tablets (0.2 mg total) by mouth 2 (two) times daily.     clopidogrel (PLAVIX) 75 MG tablet Take 1 tablet (75 mg total) by mouth daily. (Patient taking differently: Take 75 mg by mouth every evening.) 30 tablet 0   EPINEPHRINE 0.3 mg/0.3 mL IJ SOAJ injection Inject 0.3 mLs (0.3 mg total) into the muscle once as needed (allergic reaction). (Patient taking differently: Inject 0.3 mg into the muscle once as needed for anaphylaxis (severe allergic reaction).) 1 Device 1   ferrous gluconate (FERGON) 324 MG tablet Take 324 mg by mouth every other day.     furosemide (LASIX) 40 MG tablet Take 40 mg by mouth 2 (two) times daily.     hydrALAZINE (APRESOLINE) 25 MG tablet Take 1 tablet (25 mg total) by mouth every 8 (eight) hours. (Patient taking differently: Take 25 mg by mouth every 8 (eight) hours as needed (hypertension).)     hydrochlorothiazide (HYDRODIURIL) 50 MG tablet Take 1 tablet (50 mg total) by mouth daily. (Patient taking differently: Take 50 mg by mouth daily at 12 noon.)     HYDROcodone-acetaminophen (NORCO) 10-325 MG tablet Take 0.5-1 tablets by mouth 3 (three) times daily as needed for moderate pain or severe pain. (Patient taking differently: Take 0.5 tablets by mouth every 8 (eight) hours as needed for moderate pain (lower back pain).) 9 tablet 0   insulin detemir (LEVEMIR) 100 unit/ml SOLN Inject 20 Units into the skin at bedtime.     insulin lispro (HUMALOG) 100 UNIT/ML injection Inject 2-12 Units into the skin 2 (two) times daily. Per sliding scale, if 70 to 200= 0 units, 201 to 250= 2 units, 251 to 300= 4 units 301 to 350= 6 units, 351 to 400= 8 units, 401 to 450= 10 units, 451 to 600= 12 units     losartan (COZAAR) 100 MG tablet Take 1 tablet (100 mg total) by mouth daily. (Patient taking differently: Take 100 mg by  mouth 2 (two) times daily. 8am and 7pm)     metFORMIN (GLUCOPHAGE) 500 MG tablet Take 1 tablet (500 mg total) by mouth 2 (two) times daily with a meal. 60 tablet 0   metoprolol succinate (TOPROL XL) 25 MG 24 hr tablet Take 1 tablet (25 mg total) by mouth daily.     Multiple Vitamin (MULTIVITAMIN WITH MINERALS) TABS tablet Take 1 tablet by mouth every morning.     naloxone (NARCAN) nasal spray 4 mg/0.1 mL Place 0.4 mg into the nose daily as needed (drug overdose).     nitroGLYCERIN (NITROSTAT) 0.4 MG SL tablet Place 1 tablet (0.4 mg total) under the tongue every 5 (five) minutes as needed for chest  pain. (Patient taking differently: Place 0.4 mg under the tongue every 5 (five) minutes as needed for chest pain (max 3 doses. if no relief call MD).) 25 tablet 0   ondansetron (ZOFRAN) 4 MG tablet Take 1 tablet (4 mg total) by mouth every 8 (eight) hours as needed for nausea or vomiting.     pantoprazole (PROTONIX) 40 MG tablet Take 1 tablet (40 mg total) by mouth 2 (two) times daily before a meal.     polyethylene glycol (MIRALAX / GLYCOLAX) 17 g packet Take 17 g by mouth every morning.     predniSONE (DELTASONE) 5 MG tablet Take 15 mg by mouth daily with breakfast.     PRESCRIPTION MEDICATION 1 Dose See admin instructions. Order date 08/12/22: give CMP and magnesium one time only for 3 days     senna (SENOKOT) 8.6 MG TABS tablet Take 2 tablets by mouth daily as needed (constipation).     tiZANidine (ZANAFLEX) 2 MG tablet Take 2 mg by mouth every 6 (six) hours as needed for muscle spasms.     traZODone (DESYREL) 150 MG tablet Take 1 tablet (150 mg total) by mouth at bedtime. 30 tablet 0   Vitamin D, Ergocalciferol, (DRISDOL) 1.25 MG (50000 UNIT) CAPS capsule Take 1 capsule (50,000 Units total) by mouth every 7 (seven) days. (Patient taking differently: Take 50,000 Units by mouth every Friday.) 5 capsule    vitamin E 1000 UNIT capsule Take 1,000 Units by mouth daily at 12 noon.     Lancets (ONETOUCH DELICA  PLUS LANCET33G) MISC Apply topically daily.     ONETOUCH VERIO test strip daily.     predniSONE (DELTASONE) 20 MG tablet Take 1 tablet (20 mg total) by mouth daily. (Patient not taking: Reported on 08/14/2022)     sucralfate (CARAFATE) 1 g tablet Take 1 tablet (1 g total) by mouth 3 (three) times daily before meals for 5 days. (Patient not taking: Reported on 08/14/2022) 15 tablet 0    Results for orders placed or performed during the hospital encounter of 08/13/22 (from the past 48 hour(s))  Glucose, capillary     Status: Abnormal   Collection Time: 08/16/22  7:49 PM  Result Value Ref Range   Glucose-Capillary 356 (H) 70 - 99 mg/dL    Comment: Glucose reference range applies only to samples taken after fasting for at least 8 hours.  Glucose, capillary     Status: Abnormal   Collection Time: 08/17/22  6:20 AM  Result Value Ref Range   Glucose-Capillary 235 (H) 70 - 99 mg/dL    Comment: Glucose reference range applies only to samples taken after fasting for at least 8 hours.  Basic metabolic panel     Status: Abnormal   Collection Time: 08/17/22  6:21 AM  Result Value Ref Range   Sodium 138 135 - 145 mmol/L   Potassium 4.4 3.5 - 5.1 mmol/L   Chloride 94 (L) 98 - 111 mmol/L   CO2 31 22 - 32 mmol/L   Glucose, Bld 232 (H) 70 - 99 mg/dL    Comment: Glucose reference range applies only to samples taken after fasting for at least 8 hours.   BUN 35 (H) 6 - 20 mg/dL   Creatinine, Ser 7.89 (H) 0.44 - 1.00 mg/dL   Calcium 9.6 8.9 - 38.1 mg/dL   GFR, Estimated 40 (L) >60 mL/min    Comment: (NOTE) Calculated using the CKD-EPI Creatinine Equation (2021)    Anion gap 13 5 - 15  Comment: Performed at [facility], [place] [address], [place], [place] [address]  Magnesium     Status: Abnormal   Collection Time: 08/17/22  6:21 AM  Result Value Ref Range   Magnesium 1.3 (L) 1.7 - 2.4 mg/dL    Comment: Performed at [facility] [address] [place], [place] [address]  Glucose,  capillary     Status: Abnormal   Collection Time: 08/17/22 11:33 AM  Result Value Ref Range   Glucose-Capillary 270 (H) 70 - 99 mg/dL    Comment: Glucose reference range applies only to samples taken after fasting for at least 8 hours.  Glucose, capillary     Status: Abnormal   Collection Time: 08/17/22  4:44 PM  Result Value Ref Range   Glucose-Capillary 397 (H) 70 - 99 mg/dL    Comment: Glucose reference range applies only to samples taken after fasting for at least 8 hours.  Glucose, capillary     Status: Abnormal   Collection Time: 08/17/22  9:03 PM  Result Value Ref Range   Glucose-Capillary 364 (H) 70 - 99 mg/dL    Comment: Glucose reference range applies only to samples taken after fasting for at least 8 hours.  Basic metabolic panel     Status: Abnormal   Collection Time: 08/18/22  3:48 AM  Result Value Ref Range   Sodium 135 135 - 145 mmol/L   Potassium 4.4 3.5 - 5.1 mmol/L   Chloride 94 (L) 98 - 111 mmol/L   CO2 29 22 - 32 mmol/L   Glucose, Bld 255 (H) 70 - 99 mg/dL    Comment: Glucose reference range applies only to samples taken after fasting for at least 8 hours.   BUN 39 (H) 6 - 20 mg/dL   Creatinine, Ser 1.62 (H) 0.44 - 1.00 mg/dL   Calcium 9.6 8.9 - 10.3 mg/dL   GFR, Estimated 37 (L) >60 mL/min    Comment: (NOTE) Calculated using the CKD-EPI Creatinine Equation (2021)    Anion gap 12 5 - 15    Comment: Performed at [facility] [address], [place], [place] [address]  Magnesium     Status: None   Collection Time: 08/18/22  3:48 AM  Result Value Ref Range   Magnesium 1.9 1.7 - 2.4 mg/dL    Comment: Performed at [facility] [address], [place], [place] [address]  Glucose, capillary     Status: Abnormal   Collection Time: 08/18/22  6:20 AM  Result Value Ref Range   Glucose-Capillary 272 (H) 70 - 99 mg/dL    Comment: Glucose reference range applies only to samples taken after fasting for at least 8 hours.   Comment 1 Notify RN    Comment 2  Document in Chart   Glucose, capillary     Status: Abnormal   Collection Time: 08/18/22 11:24 AM  Result Value Ref Range   Glucose-Capillary 339 (H) 70 - 99 mg/dL    Comment: Glucose reference range applies only to samples taken after fasting for at least 8 hours.  Glucose, capillary     Status: Abnormal   Collection Time: 08/18/22  4:58 PM  Result Value Ref Range   Glucose-Capillary 339 (H) 70 - 99 mg/dL    Comment: Glucose reference range applies only to samples taken after fasting for at least 8 hours.   No results found.  Review Of Systems Constitutional: No fever, chills, weight loss or gain. Eyes: No vision  change, wears glasses. No discharge or pain. Ears: No hearing loss, No tinnitus. Respiratory: No asthma, Positive COPD, pneumonias, shortness of breath. No hemoptysis. Cardiovascular: Positive chest pain, palpitation, leg edema. Gastrointestinal: No nausea, vomiting, diarrhea, constipation. No GI bleed. No hepatitis. Genitourinary: No dysuria, hematuria, kidney stone. No incontinance. Neurological: No headache, stroke, seizures.  Psychiatry: No psych facility admission for anxiety, depression, suicide. No detox. Skin: No rash. Musculoskeletal: Positive joint pain, fibromyalgia, neck pain, back pain. Lymphadenopathy: No lymphadenopathy. Hematology: No anemia or easy bruising.   Blood pressure (!) 146/98, pulse (!) 120, temperature 98.7 F (37.1 C), temperature source Oral, resp. rate 20, height 5\' 2"  (1.575 m), weight 91.4 kg, SpO2 99 %. Body mass index is 36.84 kg/m. General appearance: alert, cooperative, appears stated age and no distress Head: Normocephalic, atraumatic. Eyes: Brown eyes, Pale pink conjunctiva, corneas clear. Neck: No adenopathy, no carotid bruit, no JVD, supple, symmetrical, trachea midline and thyroid not enlarged. Resp: Clear to auscultation bilaterally. Cardio: Tachycardic, Regular rate and rhythm, S1, S2 normal, II/VI systolic murmur, no click,  rub or gallop GI: Soft, non-tender; bowel sounds normal; no organomegaly. Extremities: No edema, cyanosis or clubbing. Skin: Warm and dry.  Neurologic: Alert and oriented X 3, normal strength. Normal coordination.  Assessment/Plan Sinus tachycardia Acute on chronic systolic left heart failure, HFrEF CAD HTN COPD Obesity S/P CVA CKD 3b Type 2 DM  Plan: Decrease Hydralazine by 80 %. Add Clonidine at 50 % of prior dose. Agree with holding diuretic for now. Will increase Metoprolol tomorrow if needed. Discussed low salt diet and restricted fluid intake.  Time spent: Review of old records, Lab, x-rays, EKG, other cardiac tests, examination, discussion with patient/Nurse/Physician over 70 minutes.  , MD  08/18/2022, 6:32 PM

## 2022-08-18 NOTE — Progress Notes (Signed)
Progress Note   Patient: Erin Jackson MPN:[id number] DOB: 28-Nov-1966 DOA: 08/13/2022     4 DOS: the patient was seen and examined on 08/18/2022   Brief hospital course: Erin Jackson was admitted to the hospital with the working diagnosis of heart failure decompensation.   56 yo female with the past medical history of heart failure, CVA, CAD, hypertension, CKD stage 3b and T2DM who presented with dyspnea. Reported 2 weeks of worsening dyspnea, associated with lower extremity edema.  Recent hospitalization 07/20/22 to 07/27/22 for hypocalcemia due to vitamin D deficiency and hypomagnesemia, hospitalization complicated with aspiration pneumonia and was discharged to SNF.  On her initial physical examination her blood pressure was 160/113, HR 122, RR 22, 02 saturation 99%, lungs with no wheezing or rhonchi, heart with S1 and S2 present with no gallops, abdomen not distended and positive lower extremity edema +++ pitting.   Na 138, K 4,3 CL 101, bicarbonate 25, glucose 193, bun 25 cr 1,54  Mg <0.5  Ca 7.6  BNP 63,8  Wbc 11.8, hgb 10.7 plt 245  Chest radiograph with mild hilar vascular congestion bilaterally.   EKG 126 bpm, left axis, left anterior fascicular block, sinus rhythm with no significant ST segment or T wave changes.   Patient has been placed on furosemide for diuresis.  Mag supplementation.   08/27 patient diuresing well, this am with hypertension and sinus tachycardia.  08/28 her volume status continue to improve, continue with sinus tachycardia.  08/29 responding well to diuresis, but continue tachycardic despite increased dose of metoprolol.   Assessment and Plan: * Acute systolic CHF (congestive heart failure) (HCC) Echocardiogram with mild reduction in systolic function 40 to 45%. Global hypokinesis. RV systolic function is preserved. Pericardial effusion circumferential, no significant valvular disease.   Documented urine output is 3,650 ml Systolic blood pressure is 113  to 118 mmHg.  Continue with furosemide 80 mg IV q12 and empagliflozin.  After load reduction with hydralazine and isosorbide.  Sinus tachycardia Persistent sinus tachycardia despite increased dose of metoprolol up to 75 mg daily.  Patient may benefit from ivabradine, will consult with advance heart failure team.  Acute renal failure superimposed on stage 3b chronic kidney disease (HCC) Hypocalcemia and hypomagnesemia.   Volume status is improving her net fluid balance since admission is 14,013 ml.  Renal function with serum cr 1,62, K 4,4 and serum bicarbonate at 29. Mg 1.9  Plan to continue diuresis with furosemide and SGLT 2 inh.   Follow up renal function and electrolytes.   CAD (coronary artery disease) No chest pain, no acute coronary syndrome.  Continue with clopidogrel and statin therapy.   GERD (gastroesophageal reflux disease) Continue with pantoprazole.   History of CVA (cerebrovascular accident) No acute focal deficits Continue antiplatelet therapy with clopidogrel and statin with atorvastatin.  Continue blood pressure control.    Primary hypertension Uncontrolled hypertension  Her blood pressure has improved with further diuresis.  She continue tachycardic despite high dose of metoprolol succinate 75 mg.  No RAS inhibition due to reduced GFR.   Type 2 diabetes mellitus with hyperlipidemia (HCC) Uncontrolled hyperglycemia.  Fasting glucose this am is 255 mg/dl  Continue with basal insulin to 30 units and continue sliding scale for glucose cover and monitoring.  Taper steroids.   Add pre meal insulin 4 units.   Continue with statin therapy.   Vision loss of left eye Patient with chronic left eye vision loss.  Previously empirically started on steroid for concerns of temporal arthritis.  However had biopsy on 6/13 with diagnosis ruled out.  Currently undergoing long taper of her prednisone. -No med rec from rehab on presentation.  Attempted to call rehab  without answer.  Last known to be down to 20 mg on 8/7.  \  Decrease dose to 5 mg of prednisone.   Anxiety At home patient taking alprazolam tid, will change to tid dosing, possible withdrawal driving tachycardia.   Vitamin D deficiency Continue q7 weekly vitamin D pending med rec from rehab for timing of last dose  Class 2 obesity Calculated BMI 39.0         Subjective: Patient with improvement in dyspnea and edema no chest pain or palpitations   Physical Exam: Vitals:   08/17/22 1046 08/17/22 1941 08/18/22 0304 08/18/22 0403  BP: (!) 176/112 119/88 113/82   Pulse:      Resp: 20 16 20    Temp: 98.8 F (37.1 C) 98.5 F (36.9 C) 98.1 F (36.7 C)   TempSrc: Oral Oral Oral   SpO2: 95% 97% 93%   Weight:    91.4 kg  Height:       Neurology awake and alert ENT with mild pallor Cardiovascular with S1 and S2 present and tachycardic with no gallops or rubs Moderate JVD Lower extremity pitting + to ++ Respiratory with mild rales at bases with no wheezing Abdomen with no distention  Data Reviewed:    Family Communication: no family at the bedside   Disposition: Status is: Inpatient Remains inpatient appropriate because: heart failure   Planned Discharge Destination: SNF     Author: , MD 08/18/2022 10:16 AM  For on call review [email or web address]/[email or web address]/2022.

## 2022-08-18 NOTE — Progress Notes (Signed)
Mobility Specialist - Progress Note   08/18/22 1148  Mobility  Activity Ambulated with assistance in hallway  Level of Assistance Contact guard assist, steadying assist  Assistive Device Four wheel walker  Distance Ambulated (ft) 140 ft  Activity Response Tolerated well  $Mobility charge 1 Mobility    Pre-mobility:133 HR,142/89 BP,95% SpO2 During mobility:153 HR, 93% SpO2 Post-mobility: 145 HR, 121/86 BP,96% SpO2  Pt was received in bed and agreeable to mobility. X1 seated rest break due to social break. Pt was returned to chair with all needs met.   Larey Seat

## 2022-08-19 ENCOUNTER — Encounter (HOSPITAL_COMMUNITY): Payer: Self-pay | Admitting: Internal Medicine

## 2022-08-19 DIAGNOSIS — I5021 Acute systolic (congestive) heart failure: Secondary | ICD-10-CM | POA: Diagnosis not present

## 2022-08-19 LAB — CBC WITH DIFFERENTIAL/PLATELET
Abs Immature Granulocytes: 0.04 10*3/uL (ref 0.00–0.07)
Basophils Absolute: 0 10*3/uL (ref 0.0–0.1)
Basophils Relative: 0 %
Eosinophils Absolute: 0.1 10*3/uL (ref 0.0–0.5)
Eosinophils Relative: 1 %
HCT: 39.2 % (ref 36.0–46.0)
Hemoglobin: 12.8 g/dL (ref 12.0–15.0)
Immature Granulocytes: 0 %
Lymphocytes Relative: 51 %
Lymphs Abs: 5 10*3/uL — ABNORMAL HIGH (ref 0.7–4.0)
MCH: 28.7 pg (ref 26.0–34.0)
MCHC: 32.7 g/dL (ref 30.0–36.0)
MCV: 87.9 fL (ref 80.0–100.0)
Monocytes Absolute: 0.7 10*3/uL (ref 0.1–1.0)
Monocytes Relative: 7 %
Neutro Abs: 4 10*3/uL (ref 1.7–7.7)
Neutrophils Relative %: 41 %
Platelets: 249 10*3/uL (ref 150–400)
RBC: 4.46 MIL/uL (ref 3.87–5.11)
RDW: 14.7 % (ref 11.5–15.5)
WBC: 9.8 10*3/uL (ref 4.0–10.5)
nRBC: 0 % (ref 0.0–0.2)

## 2022-08-19 LAB — GLUCOSE, CAPILLARY
Glucose-Capillary: 251 mg/dL — ABNORMAL HIGH (ref 70–99)
Glucose-Capillary: 241 mg/dL — ABNORMAL HIGH (ref 70–99)
Glucose-Capillary: 298 mg/dL — ABNORMAL HIGH (ref 70–99)
Glucose-Capillary: 226 mg/dL — ABNORMAL HIGH (ref 70–99)

## 2022-08-19 LAB — BASIC METABOLIC PANEL
Anion gap: 17 — ABNORMAL HIGH (ref 5–15)
BUN: 45 mg/dL — ABNORMAL HIGH (ref 6–20)
CO2: 29 mmol/L (ref 22–32)
Calcium: 10.4 mg/dL — ABNORMAL HIGH (ref 8.9–10.3)
Chloride: 89 mmol/L — ABNORMAL LOW (ref 98–111)
Creatinine, Ser: 1.98 mg/dL — ABNORMAL HIGH (ref 0.44–1.00)
GFR, Estimated: 29 mL/min — ABNORMAL LOW (ref >60)
Glucose, Bld: 260 mg/dL — ABNORMAL HIGH (ref 70–99)
Potassium: 5 mmol/L (ref 3.5–5.1)
Sodium: 135 mmol/L (ref 135–145)

## 2022-08-19 LAB — MAGNESIUM: Magnesium: 1.7 mg/dL (ref 1.7–2.4)

## 2022-08-19 MED ORDER — METOPROLOL SUCCINATE ER 25 MG PO TB24
25.0000 mg | ORAL_TABLET | Freq: Every day | ORAL | Status: DC
Start: 2022-08-19 — End: 2022-08-19

## 2022-08-19 MED ORDER — DILTIAZEM HCL 30 MG PO TABS
30.0000 mg | ORAL_TABLET | Freq: Four times a day (QID) | ORAL | Status: DC
  Administered 2022-08-19 – 2022-08-20 (×4): 30 mg via ORAL
  Filled 2022-08-19 (×4): qty 1

## 2022-08-19 MED ORDER — ALPRAZOLAM 0.5 MG PO TABS
1.0000 mg | ORAL_TABLET | Freq: Two times a day (BID) | ORAL | Status: DC
  Administered 2022-08-19 – 2022-08-22 (×6): 1 mg via ORAL
  Filled 2022-08-19 (×6): qty 2

## 2022-08-19 MED ORDER — METOPROLOL TARTRATE 50 MG PO TABS
50.0000 mg | ORAL_TABLET | Freq: Two times a day (BID) | ORAL | Status: DC
  Administered 2022-08-19 (×2): 50 mg via ORAL
  Filled 2022-08-19 (×3): qty 1

## 2022-08-19 MED ORDER — INSULIN DETEMIR 100 UNIT/ML ~~LOC~~ SOLN
30.0000 [IU] | Freq: Every day | SUBCUTANEOUS | Status: DC
  Administered 2022-08-20: 30 [IU] via SUBCUTANEOUS
  Filled 2022-08-19 (×2): qty 0.3

## 2022-08-19 MED ORDER — HYDRALAZINE HCL 10 MG PO TABS
10.0000 mg | ORAL_TABLET | Freq: Two times a day (BID) | ORAL | Status: DC
Start: 2022-08-19 — End: 2022-08-19

## 2022-08-19 MED ORDER — ISOSORBIDE DINITRATE 10 MG PO TABS
10.0000 mg | ORAL_TABLET | Freq: Three times a day (TID) | ORAL | Status: DC
  Administered 2022-08-19 (×2): 10 mg via ORAL
  Filled 2022-08-19 (×3): qty 1

## 2022-08-19 MED ORDER — INSULIN ASPART 100 UNIT/ML IJ SOLN
6.0000 [IU] | Freq: Three times a day (TID) | INTRAMUSCULAR | Status: DC
  Administered 2022-08-19 – 2022-08-21 (×7): 6 [IU] via SUBCUTANEOUS

## 2022-08-19 NOTE — Progress Notes (Signed)
   08/19/22 1106  Mobility  Activity Ambulated with assistance in hallway  Level of Assistance Standby assist, set-up cues, supervision of patient - no hands on  Assistive Device Four wheel walker  Distance Ambulated (ft) 140 ft  Activity Response Tolerated well  $Mobility charge 1 Mobility   Mobility Specialist Progress Note  Pt was in bed and agreeable. X1 seated break d/t fatigue. Returned back to bed w/ call bell in reach and all needs met.   Lucious Groves Mobility Specialist

## 2022-08-19 NOTE — Evaluation (Signed)
Occupational Therapy Evaluation Patient Details Name: Erin Jackson MRN: [id number] DOB: Apr 29, 1966 Today's Date: 08/19/2022   History of Present Illness Patient is 56 y.o. female present to [facility] with 2 weeks of worsening dyspnea, associated with lower extremity edema. Pt with recent hospitalization 07/20/22 to 07/27/22 for hypocalcemia due to vitamin D deficiency and hypomagnesemia; hospitalization complicated with aspiration pneumonia and was discharged to SNF. PMH significant for heart failure, CVA, CAD, hypertension, CKD stage 3b and T2DM who presented with dyspnea.   Clinical Impression   Prior to this admission, patient residing at [facility] receiving rehab. Prior to recent admissions patient was living alone and independent with ADLs and IADLs. Currently, patient is min guard for ADLs and transfers, benefiting from the use of a rollator in session. HR stable in 130s for ambulation, which is patient's baseline. Other VSS stable throughout. OT recommending return to SNF level rehab prior to discharge home to improve in overall independence to promote success at home. OT will continue to follow acutely.      Recommendations for follow up therapy are one component of a multi-disciplinary discharge planning process, led by the attending physician.  Recommendations may be updated based on patient status, additional functional criteria and insurance authorization.   Follow Up Recommendations  Skilled nursing-short term rehab (<3 hours/day)    Assistance Recommended at Discharge Intermittent Supervision/Assistance  Patient can return home with the following A little help with walking and/or transfers;A little help with bathing/dressing/bathroom;Assistance with cooking/housework;Direct supervision/assist for medications management;Direct supervision/assist for financial management;Assist for transportation;Help with stairs or ramp for entrance    Functional Status Assessment  Patient has had a  recent decline in their functional status and demonstrates the ability to make significant improvements in function in a reasonable and predictable amount of time.  Equipment Recommendations  Other (comment) (Defer to next venue of care)    Recommendations for Other Services       Precautions / Restrictions Precautions Precautions: Fall Restrictions Weight Bearing Restrictions: No      Mobility Bed Mobility Overal bed mobility: Needs Assistance Bed Mobility: Sit to Supine       Sit to supine: Min guard   General bed mobility comments: pt able to raise bil LE's onto bed with guarding. required use of bed rail and extra time.    Transfers Overall transfer level: Needs assistance Equipment used: Rollator (4 wheels) Transfers: Sit to/from Stand Sit to Stand: Min guard           General transfer comment: guarding for safety with power up from recliner. cues for hand placement on rollator and to lock brakes prior to standing.      Balance Overall balance assessment: Needs assistance Sitting-balance support: Feet supported, No upper extremity supported Sitting balance-Leahy Scale: Good Sitting balance - Comments: sits unsupported   Standing balance support: Bilateral upper extremity supported, During functional activity Standing balance-Leahy Scale: Poor Standing balance comment: rollator for gait                           ADL either performed or assessed with clinical judgement   ADL Overall ADL's : Needs assistance/impaired Eating/Feeding: Set up;Sitting   Grooming: Set up;Sitting;Supervision/safety   Upper Body Bathing: Set up;Sitting   Lower Body Bathing: Sitting/lateral leans;Sit to/from stand;Moderate assistance   Upper Body Dressing : Set up;Sitting   Lower Body Dressing: Moderate assistance;Sitting/lateral leans;Sit to/from stand   Toilet Transfer: Min guard;Comfort height toilet;Rollator (4 wheels)  Toileting- Architect and  Hygiene: Min guard;Sitting/lateral lean;Sit to/from stand       Functional mobility during ADLs: Diplomatic Services operational officer (4 wheels) General ADL Comments: Patient presenting to with decreased activity tolerance     Vision Baseline Vision/History: 1 Wears glasses (reading; reports that she cannot see well out of her L eye and that this is not new) Ability to See in Adequate Light: 0 Adequate Patient Visual Report: No change from baseline       Perception     Praxis      Pertinent Vitals/Pain Pain Assessment Pain Assessment: No/denies pain Pain Intervention(s): Limited activity within patient's tolerance, Monitored during session     Hand Dominance Right   Extremity/Trunk Assessment Upper Extremity Assessment Upper Extremity Assessment: Overall WFL for tasks assessed   Lower Extremity Assessment Lower Extremity Assessment: Defer to PT evaluation   Cervical / Trunk Assessment Cervical / Trunk Assessment: Kyphotic;Other exceptions Cervical / Trunk Exceptions: habitus   Communication Communication Communication: No difficulties   Cognition Arousal/Alertness: Awake/alert Behavior During Therapy: WFL for tasks assessed/performed Overall Cognitive Status: Within Functional Limits for tasks assessed                                 General Comments: Participatory throughout, motivated to work with therapy     General Comments  VSS on RA, patient is known to sustain HR in 130s    Exercises     Shoulder Instructions      Home Living Family/patient expects to be discharged to:: Private residence Living Arrangements: Alone Available Help at Discharge: Family;Available PRN/intermittently Type of Home: Apartment Home Access: Stairs to enter Entrance Stairs-Number of Steps: 3 Entrance Stairs-Rails: None Home Layout: One level     Bathroom Shower/Tub: Chief Strategy Officer: Standard Bathroom Accessibility: Yes   Home Equipment: Wheelchair -  Forensic psychologist (2 wheels);Tub bench   Additional Comments: patient had a stroke earlier this year and completed inpatient rehab after hospital discharge. has been in/out of rehab thorughout the last year.      Prior Functioning/Environment Prior Level of Function : Independent/Modified Independent             Mobility Comments: using RW for mobility, reports having to use the WC the day before admission secondary to weakness ADLs Comments: pt reports supposed to have aid m-f        OT Problem List: Decreased strength;Decreased activity tolerance;Decreased range of motion;Impaired balance (sitting and/or standing);Decreased coordination;Decreased knowledge of use of DME or AE;Decreased knowledge of precautions;Obesity;Decreased cognition;Decreased safety awareness      OT Treatment/Interventions: Self-care/ADL training;DME and/or AE instruction;Therapeutic activities;Visual/perceptual remediation/compensation;Patient/family education;Balance training;Neuromuscular education;Therapeutic exercise    OT Goals(Current goals can be found in the care plan section) Acute Rehab OT Goals Patient Stated Goal: to go back to rehab and get stronger OT Goal Formulation: With patient Time For Goal Achievement: 09/02/22 Potential to Achieve Goals: Good ADL Goals Pt Will Transfer to Toilet: Independently;ambulating Pt Will Perform Toileting - Clothing Manipulation and hygiene: Independently;sit to/from stand;sitting/lateral leans Pt/caregiver will Perform Home Exercise Program: Increased strength;Both right and left upper extremity;With theraband;With written HEP provided;Independently Additional ADL Goal #1: Patient will verbalize strategies for energy conservation and fall prevention in preparation for safe discharge home. Additional ADL Goal #2: Patient will verbalize 3-4 strategies for managing CHF to promote independence and decrease further hospital admissions.  OT Frequency: Min  2X/week    Co-evaluation  AM-PAC OT "6 Clicks" Daily Activity     Outcome Measure Help from another person eating meals?: A Little Help from another person taking care of personal grooming?: A Little Help from another person toileting, which includes using toliet, bedpan, or urinal?: A Little Help from another person bathing (including washing, rinsing, drying)?: A Lot Help from another person to put on and taking off regular upper body clothing?: A Little Help from another person to put on and taking off regular lower body clothing?: A Lot 6 Click Score: 16   End of Session Equipment Utilized During Treatment: Gait belt;Rollator (4 wheels) Nurse Communication: Mobility status  Activity Tolerance: Patient tolerated treatment well Patient left: in bed;with call bell/phone within reach;Other (comment) (Doctor in room)  OT Visit Diagnosis: Other abnormalities of gait and mobility (R26.89);Muscle weakness (generalized) (M62.81);Low vision, both eyes (H54.2);Pain;Dizziness and giddiness (R42);Other symptoms and signs involving cognitive function;Unsteadiness on feet (R26.81)                Time: 7017-7939 OT Time Calculation (min): 30 min Charges:  OT General Charges $OT Visit: 1 Visit OT Evaluation $OT Eval Moderate Complexity: 1 Mod OT Treatments $Self Care/Home Management : 8-22 mins  Pollyann Glen E. Tamina Cyphers, OTR/L Acute Rehabilitation Services [phone]   Cherlyn Cushing 08/19/2022, 10:10 AM

## 2022-08-19 NOTE — Care Management Important Message (Signed)
Important Message  Patient Details  Name: Erin Jackson MRN: [id number] Date of Birth: [birth year]   Medicare Important Message Given:  Yes     Renie Ora 08/19/2022, 8:32 AM

## 2022-08-19 NOTE — TOC Initial Note (Signed)
Transition of Care [facility]) - Initial/Assessment Note    Patient Details  Name: Erin Jackson MRN: [id number] Date of Birth: 07-03-1966  Transition of Care [facility]) CM/SW Contact:    Coralee Pesa, [place] Phone Number: 08/19/2022, 11:38 AM  Clinical Narrative:                 CSW met with pt at bedside to discuss DC plans. Pt states she would like to return to [facility] at [place]. Pt was discharged from [place] and then was admitted. CSW advised that an insurance authorization will be started, but in case it is not approved, pt states she would go home w/ HH. She says she has had a provider before, but does not remember who they were. Pt states she will need ambulance transport at [place] updated facility who stated they can accept pt back if the authorization is approved. Authorization started, TOC will continue to follow for DC needs.  Expected Discharge Plan: [facility] Barriers to Discharge: Continued Medical Work up, SNF Pending bed offer, Insurance Authorization   Patient Goals and CMS Choice Patient states their goals for this hospitalization and ongoing recovery are:: Pt states she wants to return to [facility]. CMS Medicare.gov Compare Post Acute Care list provided to:: Patient Choice offered to / list presented to : Patient  Expected Discharge Plan and Services Expected Discharge Plan: [place] Choice: [place] Living arrangements for the past 2 months: [place]                                      Prior Living Arrangements/Services Living arrangements for the past 2 months: [place] Lives with:: Self Patient language and need for interpreter reviewed:: Yes Do you feel safe going back to the place where you live?: Yes      Need for Family Participation in Patient Care: No (Comment) Care giver support system in place?: No (comment)   Criminal Activity/Legal  Involvement Pertinent to Current Situation/Hospitalization: No - Comment as needed  Activities of Daily Living Home Assistive Devices/Equipment: Wheelchair, Environmental consultant (specify type) ADL Screening (condition at time of admission) Patient's cognitive ability adequate to safely complete daily activities?: Yes Is the patient deaf or have difficulty hearing?: No (Blind in left eye) Does the patient have difficulty seeing, even when wearing glasses/contacts?: Yes Does the patient have difficulty concentrating, remembering, or making decisions?: No Patient able to express need for assistance with ADLs?: Yes Does the patient have difficulty dressing or bathing?: Yes Independently performs ADLs?: No Communication: Independent Dressing (OT): Needs assistance Is this a change from baseline?: Pre-admission baseline Grooming: Needs assistance Is this a change from baseline?: Pre-admission baseline Feeding: Independent Bathing: Needs assistance Is this a change from baseline?: Pre-admission baseline Toileting: Needs assistance Is this a change from baseline?: Pre-admission baseline In/Out Bed: Independent Walks in Home: Needs assistance Is this a change from baseline?: Pre-admission baseline Does the patient have difficulty walking or climbing stairs?: Yes Weakness of Legs: Both Weakness of Arms/Hands: None  Permission Sought/Granted Permission sought to share information with : Facility Art therapist granted to share information with : Yes, Verbal Permission Granted     Permission granted to share info w AGENCY: [facility]        Emotional Assessment Appearance:: Appears stated age Attitude/Demeanor/Rapport: Engaged Affect (typically  observed): Appropriate Orientation: : Oriented to Self, Oriented to Place, Oriented to  Time, Oriented to Situation Alcohol / Substance Use: Not Applicable Psych Involvement: No (comment)  Admission diagnosis:  Hypocalcemia  [E83.51] Hypomagnesemia [E83.42] Hypervolemia, unspecified hypervolemia type [E87.70] Heart failure ([place]) [I50.9] Patient Active Problem List   Diagnosis Date Noted   Anxiety 08/17/2022   Class 2 obesity 08/15/2022   Acute renal failure superimposed on stage 3b chronic kidney disease ([place]) 08/13/2022   Vitamin D deficiency 08/13/2022   Iron deficiency anemia 07/22/2022   History of CVA (cerebrovascular accident) 07/22/2022   Debility 07/22/2022   Fall at home 07/22/2022   Hypoalbuminemia 07/22/2022   Obesity (BMI 30-39.9) 07/22/2022   Vision loss of left eye 06/01/2022   Temporal arteritis ([place]) 14/15/9733   Embolic stroke ([place]) 12/50/8719   Stage 3b chronic kidney disease ([place])    Chronic systolic heart failure ([place])    Cerebral embolism with cerebral infarction 04/22/2022   Hypertensive urgency 04/21/2022   Altered mental status    Dizziness    Nausea    CAD (coronary artery disease) 04/10/2022   PVD (peripheral vascular disease) ([place]) 04/10/2022   Ischemic cardiomyopathy 94/11/9046   Acute systolic CHF (congestive heart failure) ([place]) 04/10/2022   Allergic reaction 04/08/2022   Angina at rest [facility]) 04/08/2022   Osteoarthritis of carpometacarpal ([place]) joint of right thumb 06/18/2020   Primary osteoarthritis of first carpometacarpal joint of right hand 11/09/2019   Right carpal tunnel syndrome 11/09/2019   De Quervain's tenosynovitis, right 11/09/2019   Arthritis of carpometacarpal ([facility]) joints of both thumbs 11/04/2017   Chronic neck pain 08/20/2017   Supraclavicular mass 08/20/2017   Primary hypertension 08/20/2017   Type 2 diabetes mellitus with hyperlipidemia ([place]) 08/20/2017   GERD (gastroesophageal reflux disease) 08/20/2017   Cigarette nicotine dependence without complication 53/39/1792   PCP:  [facility], [place]:  No Pharmacies Listed    Social Determinants of Health (SDOH) Interventions Food Insecurity Interventions: Intervention Not  Indicated Financial Strain Interventions: Intervention Not Indicated Housing Interventions: Intervention Not Indicated Transportation Interventions: Intervention Not Indicated  Readmission Risk Interventions     No data to display

## 2022-08-19 NOTE — Progress Notes (Signed)
Patient sustain sinus tach overnight.

## 2022-08-19 NOTE — Inpatient Diabetes Management (Signed)
Inpatient Diabetes Program Recommendations  AACE/ADA: New Consensus Statement on Inpatient Glycemic Control (2015)  Target Ranges:  Prepandial:   less than 140 mg/dL      Peak postprandial:   less than 180 mg/dL (1-2 hours)      Critically ill patients:  140 - 180 mg/dL   Lab Results  Component Value Date   GLUCAP 251 (H) 08/19/2022   HGBA1C 6.8 (H) 04/22/2022    Review of Glycemic Control   Latest Reference Range & Units 08/18/22 06:20 08/18/22 11:24 08/18/22 16:58 08/18/22 21:26 08/19/22 06:26  Glucose-Capillary 70 - 99 mg/dL 217 (H) 471 (H) 595 (H) 142 (H) 251 (H)   Diabetes history: DM 2 Outpatient Diabetes medications: Levemir 20 units qhs, Humalog 2-12 units tid, Metformin 500 mg bid Current orders for Inpatient glycemic control:  Levemir 25 units Daily Novolog 0-15 units tid Jardiance 10 mg Daily Novolog 4 units tid meal coverage PO Prednisone 5 mg Daily  Inpatient Diabetes Program Recommendations:    -   Increase Levemir to 30 units -   Consider increasing meal coverage to Novolog 6 units tid if eating >50% of meals  Thanks,  Christena Deem RN, MSN, BC-ADM Inpatient Diabetes Coordinator Team Pager [phone] (8a-5p)

## 2022-08-19 NOTE — Consult Note (Signed)
Ref: [facility]   Subjective:  Awake. Sinus tachycardia continues with mild decrease in heart rate. Dietary non-compliance observed by nurse.  Objective:  Vital Signs in the last 24 hours: Temp:  [98 F (36.7 C)-98.4 F (36.9 C)] 98.4 F (36.9 C) (08/30 0728) Pulse Rate:  [116-125] 125 (08/30 0728) Cardiac Rhythm: Sinus tachycardia (08/30 1900) Resp:  [19-22] 19 (08/30 1300) BP: (118-152)/(74-105) 123/74 (08/30 0728) SpO2:  [95 %-100 %] 95 % (08/30 0728) Weight:  [90.4 kg] 90.4 kg (08/30 0516)  Physical Exam: BP Readings from Last 1 Encounters:  08/19/22 123/74     Wt Readings from Last 1 Encounters:  08/19/22 90.4 kg    Weight change: -0.907 kg Body mass index is 36.47 kg/m. HEENT: [place]/AT, Eyes-Brown, Conjunctiva-Pale pink, Sclera-Non-icteric Neck: No JVD, No bruit, Trachea midline. Lungs:  Clear, Bilateral. Cardiac:  Regular rhythm, normal S1 and S2, no S3. II/VI systolic murmur. Abdomen:  Soft, non-tender. BS present. Extremities:  No edema present. No cyanosis. No clubbing. CNS: AxOx3, Cranial nerves grossly intact, moves all 4 extremities.  Skin: Warm and dry.   Intake/Output from previous day: 08/29 0701 - 08/30 0700 In: 480 [P.O.:480] Out: -     Lab Results: BMET    Component Value Date/Time   NA 135 08/19/2022 0630   NA 135 08/18/2022 0348   NA 138 08/17/2022 0621   K 5.0 08/19/2022 0630   K 4.4 08/18/2022 0348   K 4.4 08/17/2022 0621   CL 89 (L) 08/19/2022 0630   CL 94 (L) 08/18/2022 0348   CL 94 (L) 08/17/2022 0621   CO2 29 08/19/2022 0630   CO2 29 08/18/2022 0348   CO2 31 08/17/2022 0621   GLUCOSE 260 (H) 08/19/2022 0630   GLUCOSE 255 (H) 08/18/2022 0348   GLUCOSE 232 (H) 08/17/2022 0621   BUN 45 (H) 08/19/2022 0630   BUN 39 (H) 08/18/2022 0348   BUN 35 (H) 08/17/2022 0621   CREATININE 1.98 (H) 08/19/2022 0630   CREATININE 1.62 (H) 08/18/2022 0348   CREATININE 1.49 (H) 08/17/2022 0621   CALCIUM 10.4 (H) 08/19/2022 0630    CALCIUM 9.6 08/18/2022 0348   CALCIUM 9.6 08/17/2022 0621   CALCIUM 5.3 (LL) 07/20/2022 1216   GFRNONAA 29 (L) 08/19/2022 0630   GFRNONAA 37 (L) 08/18/2022 0348   GFRNONAA 40 (L) 08/17/2022 0621   GFRAA >60 06/18/2020 1139   GFRAA >60 09/21/2019 0900   GFRAA >60 10/04/2018 1551   CBC    Component Value Date/Time   WBC 9.8 08/19/2022 0630   RBC 4.46 08/19/2022 0630   HGB 12.8 08/19/2022 0630   HCT 39.2 08/19/2022 0630   PLT 249 08/19/2022 0630   MCV 87.9 08/19/2022 0630   MCH 28.7 08/19/2022 0630   MCHC 32.7 08/19/2022 0630   RDW 14.7 08/19/2022 0630   LYMPHSABS 5.0 (H) 08/19/2022 0630   MONOABS 0.7 08/19/2022 0630   EOSABS 0.1 08/19/2022 0630   BASOSABS 0.0 08/19/2022 0630   HEPATIC Function Panel Recent Labs    07/21/22 0256 07/21/22 1038 07/22/22 0708 07/23/22 0556 08/14/22 0425  PROT 5.9*   < > 5.5* 5.4* 5.6*  ALBUMIN 2.6*   < > 2.3* 2.1* 2.7*  AST 17   < > 14* 17 14*  ALT 16   < > 11 19 14   ALKPHOS 65   < > 55 65 53  BILIDIR <0.1  --   --   --   --   IBILI NOT CALCULATED  --   --   --   --    < > =  values in this interval not displayed.   HEMOGLOBIN A1C Lab Results  Component Value Date   MPG 148.46 04/22/2022   CARDIAC ENZYMES Lab Results  Component Value Date   CKTOTAL 258 (H) 04/21/2022   BNP No results for input(s): "PROBNP" in the last 8760 hours. TSH Recent Labs    04/08/22 0623 04/21/22 0343  TSH 1.328 1.286   CHOLESTEROL Recent Labs    04/09/22 0740 04/22/22 0144  CHOL 325* 170    Scheduled Meds:  ALPRAZolam  1 mg Oral BID   atorvastatin  80 mg Oral Daily   calcium carbonate  1 tablet Oral BID WC   cloNIDine  0.1 mg Oral BID   clopidogrel  75 mg Oral Daily   diltiazem  30 mg Oral Q6H   empagliflozin  10 mg Oral Daily   enoxaparin (LOVENOX) injection  40 mg Subcutaneous Daily   insulin aspart  0-15 Units Subcutaneous TID WC   insulin aspart  6 Units Subcutaneous TID WC   [START ON 08/20/2022] insulin detemir  30 Units  Subcutaneous Daily   isosorbide dinitrate  10 mg Oral TID   metoprolol tartrate  50 mg Oral BID   pantoprazole  40 mg Oral BID AC   predniSONE  5 mg Oral Q breakfast   traZODone  150 mg Oral QHS   Continuous Infusions:  PRN Meds:.HYDROcodone-acetaminophen, metoprolol tartrate, tiZANidine  Assessment/Plan:  Sinus tachycardia CAD HTN COPD Obesity S/P CVA CKD 3 b Type 2 DM Dietary non-compliance  Plan: DC hydralazine. Increase metoprolol to 50 mg. Bid. Add small dose diltiazem for rate control. Patient reminded again not to eat high salt food from outside.    LOS: 5 days   Time spent including chart review, lab review, examination, discussion with patient/Nurse : 30 min   Orpah Cobb  MD  08/19/2022, 8:16 PM

## 2022-08-19 NOTE — NC FL2 (Signed)
[place] MEDICAID FL2 LEVEL OF CARE SCREENING TOOL     IDENTIFICATION  Patient Name: Erin Jackson Birthdate: 1966/04/07 Sex: female Admission Date (Current Location): 08/13/2022  [facility] and [place] Number:  Producer, television/film/video and Address:  [facility], [address], [place], [place] [address]      Provider Number: [id number]  Attending Physician Name and Address:  Zannie Cove, MD  Relative Name and Phone Number:  Rutherford Nail [phone]    Current Level of Care: Hospital Recommended Level of Care: Skilled Nursing Facility Prior Approval Number:    Date Approved/Denied:   PASRR Number: [id number]  Discharge Plan: SNF    Current Diagnoses: Patient Active Problem List   Diagnosis Date Noted   Anxiety 08/17/2022   Class 2 obesity 08/15/2022   Acute renal failure superimposed on stage 3b chronic kidney disease (HCC) 08/13/2022   Vitamin D deficiency 08/13/2022   Iron deficiency anemia 07/22/2022   History of CVA (cerebrovascular accident) 07/22/2022   Debility 07/22/2022   Fall at home 07/22/2022   Hypoalbuminemia 07/22/2022   Obesity (BMI 30-39.9) 07/22/2022   Vision loss of left eye 06/01/2022   Temporal arteritis (HCC) 06/01/2022   Embolic stroke (HCC) 04/29/2022   Stage 3b chronic kidney disease (HCC)    Chronic systolic heart failure (HCC)    Cerebral embolism with cerebral infarction 04/22/2022   Hypertensive urgency 04/21/2022   Altered mental status    Dizziness    Nausea    CAD (coronary artery disease) 04/10/2022   PVD (peripheral vascular disease) (HCC) 04/10/2022   Ischemic cardiomyopathy 04/10/2022   Acute systolic CHF (congestive heart failure) (HCC) 04/10/2022   Allergic reaction 04/08/2022   Angina at rest [facility]) 04/08/2022   Osteoarthritis of carpometacarpal (CMC) joint of right thumb 06/18/2020   Primary osteoarthritis of first carpometacarpal joint of right hand 11/09/2019   Right carpal tunnel syndrome  11/09/2019   De Quervain's tenosynovitis, right 11/09/2019   Arthritis of carpometacarpal (CMC) joints of both thumbs 11/04/2017   Chronic neck pain 08/20/2017   Supraclavicular mass 08/20/2017   Primary hypertension 08/20/2017   Type 2 diabetes mellitus with hyperlipidemia (HCC) 08/20/2017   GERD (gastroesophageal reflux disease) 08/20/2017   Cigarette nicotine dependence without complication 08/20/2017    Orientation RESPIRATION BLADDER Height & Weight     Self, Situation, Time, Place  O2 ([place] 2) Continent, External catheter Weight: 199 lb 6.4 oz (90.4 kg) Height:  5\' 2"  (157.5 cm)  BEHAVIORAL SYMPTOMS/MOOD NEUROLOGICAL BOWEL NUTRITION STATUS      Continent Diet (See DC summary)  AMBULATORY STATUS COMMUNICATION OF NEEDS Skin   Supervision Verbally Normal                       Personal Care Assistance Level of Assistance  Bathing, Feeding, Dressing Bathing Assistance: Limited assistance Feeding assistance: Independent Dressing Assistance: Limited assistance     Functional Limitations Info  Sight, Hearing, Speech Sight Info: Impaired (L blind) Hearing Info: Adequate Speech Info: Adequate    SPECIAL CARE FACTORS FREQUENCY  PT (By licensed PT), OT (By licensed OT)     PT Frequency: 5x week OT Frequency: 5x week            Contractures Contractures Info: Not present    Additional Factors Info  Code Status, Allergies, Psychotropic, Insulin Sliding Scale Code Status Info: Full Allergies Info: Cucumber Extract   Molds & Smuts   Norvasc (Amlodipine)   Other  Peanut-containing Drug Products   Apple Juice   Aspirin   Ibuprofen   Niacin And Related Psychotropic Info: Alprazolam Insulin Sliding Scale Info: See DC summary       Current Medications (08/19/2022):  This is the current hospital active medication list Current Facility-Administered Medications  Medication Dose Route Frequency Provider Last Rate Last Admin   ALPRAZolam Prudy Feeler) tablet 1 mg  1 mg Oral BID  Zannie Cove, MD       atorvastatin (LIPITOR) tablet 80 mg  80 mg Oral Daily Tu, Ching T, DO   80 mg at 08/19/22 0843   calcium carbonate (OS-CAL - dosed in mg of elemental calcium) tablet 1,250 mg  1 tablet Oral BID WC Tu, Ching T, DO   1,250 mg at 08/19/22 0843   cloNIDine (CATAPRES) tablet 0.1 mg  0.1 mg Oral BID Orpah Cobb, MD   0.1 mg at 08/19/22 0845   clopidogrel (PLAVIX) tablet 75 mg  75 mg Oral Daily Tu, Ching T, DO   75 mg at 08/19/22 0843   diltiazem (CARDIZEM) tablet 30 mg  30 mg Oral Q6H Orpah Cobb, MD   30 mg at 08/19/22 1108   empagliflozin (JARDIANCE) tablet 10 mg  10 mg Oral Daily Arrien, York Ram, MD   10 mg at 08/19/22 0851   enoxaparin (LOVENOX) injection 40 mg  40 mg Subcutaneous Daily Tu, Ching T, DO   40 mg at 08/19/22 1740   HYDROcodone-acetaminophen (NORCO/VICODIN) 5-325 MG per tablet 1 tablet  1 tablet Oral TID PRN Arrien, York Ram, MD   1 tablet at 08/19/22 0842   insulin aspart (novoLOG) injection 0-15 Units  0-15 Units Subcutaneous TID WC Arrien, York Ram, MD   5 Units at 08/19/22 1150   insulin aspart (novoLOG) injection 6 Units  6 Units Subcutaneous TID WC Zannie Cove, MD       Melene Muller ON 08/20/2022] insulin detemir (LEVEMIR) injection 30 Units  30 Units Subcutaneous Daily Zannie Cove, MD       isosorbide dinitrate (ISORDIL) tablet 10 mg  10 mg Oral TID Zannie Cove, MD       metoprolol tartrate (LOPRESSOR) injection 5 mg  5 mg Intravenous Q4H PRN Arrien, York Ram, MD   5 mg at 08/17/22 2044   metoprolol tartrate (LOPRESSOR) tablet 50 mg  50 mg Oral BID Orpah Cobb, MD   50 mg at 08/19/22 1108   pantoprazole (PROTONIX) EC tablet 40 mg  40 mg Oral BID AC Tu, Ching T, DO   40 mg at 08/19/22 0843   predniSONE (DELTASONE) tablet 5 mg  5 mg Oral Q breakfast Arrien, York Ram, MD   5 mg at 08/19/22 8144   tiZANidine (ZANAFLEX) tablet 2 mg  2 mg Oral Q6H PRN Arrien, York Ram, MD   2 mg at 08/17/22 2105   traZODone  (DESYREL) tablet 150 mg  150 mg Oral QHS Coralie Keens, MD   150 mg at 08/18/22 2059     Discharge Medications: Please see discharge summary for a list of discharge medications.  Relevant Imaging Results:  Relevant Lab Results:   Additional Information SS#: [id number]  Carley Hammed, [place]

## 2022-08-19 NOTE — Progress Notes (Signed)
PROGRESS NOTE    Erin Jackson  NLZ:[id number] DOB: [birth year] DOA: 08/13/2022 PCP: [facility]  56/F with history of chronic systolic EF 40-45% and diastolic CHF, moderate nonobstructive CAD, history of CVA, CAD, CKD 3b, hypertension, type 2 diabetes mellitus presented to the ED with fluid overload, dyspnea on exertion.  Improved with diuresis, clonidine was discontinued, developed tachycardia on hydralazine and with discontinuation of clonidine   Subjective: Feels better, breathing is improving, heart rate remains elevated, just back from a long walk  Assessment and Plan:  Acute on chronic systolic and diastolic CHF Sinus tachycardia -Echo with EF 40-45%, global hypokinesis, preserved RV function -Diuresed well with IV Lasix she is 13.5 L negative -Was taken off clonidine, now with tachycardia -Agree with resumption of low-dose clonidine, increase beta-blocker -GDMT limited by AKI/CKD -Decrease hydralazine dose -Monitor on telemetry  AKI on CKD 3b Hypocalcemia and hypomagnesemia.  Baseline creatinine has ranged from 1.3-1.4, now up to 1.9 today -Likely multifactorial, cardiorenal component with tachycardia and diuresis -Holding IV Lasix, meds as above for heart rate -Monitor urine output, BMP in a.m.  CAD (coronary artery disease) -Stable, Continue with clopidogrel and statin therapy.   GERD (gastroesophageal reflux disease) Continue with pantoprazole.   History of CVA (cerebrovascular accident) -Continue Plavix and statin  Type 2 diabetes mellitus with hyperlipidemia (HCC) Uncontrolled hyperglycemia.  -Increase Levemir and meal coverage insulin  Vision loss of left eye Patient with chronic left eye vision loss.  Previously empirically started on prednisone for suspected temporal arthritis.  However had biopsy on 6/13, temporal arteritis was ruled out, has since been on prednisone taper -Was down to 15 Mg of prednisone recently, now on 5 Mg, wean it off  this week  Anxiety Continue alprazolam 1 mg twice daily per home regimen  Vitamin D deficiency Continue q7 weekly vitamin D pending med rec from rehab for timing of last dose  Class 2 obesity Calculated BMI 39.0   DVT prophylaxis: Lovenox Code Status: Full code Family Communication: Discussed with patient detail, no family at bedside Disposition Plan: Home likely 48 hours  Consultants:    Procedures:   Antimicrobials:    Objective: Vitals:   08/19/22 0514 08/19/22 0516 08/19/22 0728 08/19/22 1142  BP:  (!) 152/105 123/74   Pulse:  (!) 120 (!) 125   Resp:  19 20 20   Temp:  98 F (36.7 C) 98.4 F (36.9 C)   TempSrc:  Oral    SpO2: 100% 100% 95%   Weight:  90.4 kg    Height:        Intake/Output Summary (Last 24 hours) at 08/19/2022 1213 Last data filed at 08/19/2022 1114 Gross per 24 hour  Intake 240 ml  Output 750 ml  Net -510 ml   Filed Weights   08/17/22 0536 08/18/22 0403 08/19/22 0516  Weight: 91.9 kg 91.4 kg 90.4 kg    Examination:  General exam: Pleasant obese female, AAOx3 Respiratory system: Clear to auscultation Cardiovascular system: S1 & S2 heard, tachycardic Abd: nondistended, soft and nontender.Normal bowel sounds heard. Central nervous system: Alert and oriented. No focal neurological deficits. Extremities: no edema Skin: No rashes Psychiatry:  Mood & affect appropriate.     Data Reviewed:   CBC: Recent Labs  Lab 08/13/22 1911 08/14/22 0425 08/19/22 0630  WBC 11.8* 11.2* 9.8  NEUTROABS 6.8  --  4.0  HGB 10.7* 10.1* 12.8  HCT 34.0* 31.7* 39.2  MCV 92.1 89.8 87.9  PLT 245 241 249   Basic  Metabolic Panel: Recent Labs  Lab 08/13/22 1911 08/14/22 0425 08/14/22 1654 08/15/22 0545 08/16/22 0329 08/17/22 0621 08/18/22 0348 08/19/22 0630  NA 138   < >  --  138 139 138 135 135  K 4.3   < >  --  4.3 4.3 4.4 4.4 5.0  CL 101   < >  --  98 98 94* 94* 89*  CO2 25   < >  --  30 31 31 29 29   GLUCOSE 193*   < >  --  203* 242* 232*  255* 260*  BUN 25*   < >  --  33* 33* 35* 39* 45*  CREATININE 1.54*   < >  --  1.41* 1.33* 1.49* 1.62* 1.98*  CALCIUM 7.6*   < >  --  8.8* 8.9 9.6 9.6 10.4*  MG <0.5*   < > 1.6* 2.3  --  1.3* 1.9 1.7  PHOS 3.2  --   --   --   --   --   --   --    < > = values in this interval not displayed.   GFR: Estimated Creatinine Clearance: 32.4 mL/min (A) (by C-G formula based on SCr of 1.98 mg/dL (H)). Liver Function Tests: Recent Labs  Lab 08/14/22 0425  AST 14*  ALT 14  ALKPHOS 53  BILITOT 0.4  PROT 5.6*  ALBUMIN 2.7*   No results for input(s): "LIPASE", "AMYLASE" in the last 168 hours. No results for input(s): "AMMONIA" in the last 168 hours. Coagulation Profile: No results for input(s): "INR", "PROTIME" in the last 168 hours. Cardiac Enzymes: No results for input(s): "CKTOTAL", "CKMB", "CKMBINDEX", "TROPONINI" in the last 168 hours. BNP (last 3 results) No results for input(s): "PROBNP" in the last 8760 hours. HbA1C: No results for input(s): "HGBA1C" in the last 72 hours. CBG: Recent Labs  Lab 08/18/22 1124 08/18/22 1658 08/18/22 2126 08/19/22 0626 08/19/22 1112  GLUCAP 339* 339* 142* 251* 241*   Lipid Profile: No results for input(s): "CHOL", "HDL", "LDLCALC", "TRIG", "CHOLHDL", "LDLDIRECT" in the last 72 hours. Thyroid Function Tests: No results for input(s): "TSH", "T4TOTAL", "FREET4", "T3FREE", "THYROIDAB" in the last 72 hours. Anemia Panel: No results for input(s): "VITAMINB12", "FOLATE", "FERRITIN", "TIBC", "IRON", "RETICCTPCT" in the last 72 hours. Urine analysis:    Component Value Date/Time   COLORURINE STRAW (A) 06/14/2022 2128   APPEARANCEUR CLEAR 06/14/2022 2128   LABSPEC 1.008 06/14/2022 2128   PHURINE 6.0 06/14/2022 2128   GLUCOSEU NEGATIVE 06/14/2022 2128   HGBUR SMALL (A) 06/14/2022 2128   BILIRUBINUR NEGATIVE 06/14/2022 2128   KETONESUR NEGATIVE 06/14/2022 2128   PROTEINUR 100 (A) 06/14/2022 2128   NITRITE NEGATIVE 06/14/2022 2128   LEUKOCYTESUR  NEGATIVE 06/14/2022 2128   Sepsis Labs: @LABRCNTIP (procalcitonin:4,lacticidven:4)  )No results found for this or any previous visit (from the past 240 hour(s)).   Radiology Studies: No results found.   Scheduled Meds:  ALPRAZolam  1 mg Oral TID   atorvastatin  80 mg Oral Daily   calcium carbonate  1 tablet Oral BID WC   cloNIDine  0.1 mg Oral BID   clopidogrel  75 mg Oral Daily   diltiazem  30 mg Oral Q6H   empagliflozin  10 mg Oral Daily   enoxaparin (LOVENOX) injection  40 mg Subcutaneous Daily   insulin aspart  0-15 Units Subcutaneous TID WC   insulin aspart  4 Units Subcutaneous TID WC   insulin detemir  25 Units Subcutaneous Daily   isosorbide dinitrate  10 mg Oral TID   metoprolol tartrate  50 mg Oral BID   pantoprazole  40 mg Oral BID AC   predniSONE  5 mg Oral Q breakfast   traZODone  150 mg Oral QHS   Continuous Infusions:   LOS: 5 days    Time spent:    Zannie Cove, MD [facility]   08/19/2022, 12:13 PM

## 2022-08-19 NOTE — Progress Notes (Signed)
Heart Failure Nurse Navigator Progress Note  PCP: [facility] PCP-Cardiologist: none-[place] consulted. Performed cath previously. Admission Diagnosis: a/c CHF Admitted from: rehab facility  Presentation:   Erin Jackson presented 8/24 with increased SOB and BLE edema. Pt sitting in bed at time of interview. Pt states she was about to graduate from rehab go home this Friday. She has an apartment and will eventually live alone when DC from [facility]. Pt states she has family that will be able to drive her or use of medical transportation. Endorses smoking 0.25-0.5 PPD, not ready to quit. Encouraged cessation. Endorses medication compliance. Reviewed dietary/fluid modifications. HF remains 140s at rest. MD aware, adjusting medication regimen.    ECHO/ LVEF: 40%  Clinical Course:  Past Medical History:  Diagnosis Date   Anxiety    Chronic back pain    Diabetes mellitus without complication (HCC)    GERD (gastroesophageal reflux disease)    Hypertension      Social History   Socioeconomic History   Marital status: Divorced    Spouse name: Not on file   Number of children: 3   Years of education: Not on file   Highest education level: Associate degree: academic program  Occupational History   Occupation: disabled  Tobacco Use   Smoking status: Every Day    Packs/day: 0.30    Types: Cigarettes   Smokeless tobacco: Never  Vaping Use   Vaping Use: Never used  Substance and Sexual Activity   Alcohol use: Not Currently    Comment: socially   Drug use: Not Currently    Types: Marijuana    Comment: occasional   Sexual activity: Never  Other Topics Concern   Not on file  Social History Narrative   Not on file   Social Determinants of Health   Financial Resource Strain: Low Risk  (08/19/2022)   Overall Financial Resource Strain (CARDIA)    Difficulty of Paying Living Expenses: Not very hard  Food Insecurity: No Food Insecurity (08/19/2022)   Hunger Vital  Sign    Worried About Running Out of Food in the Last Year: Never true    Ran Out of Food in the Last Year: Never true  Transportation Needs: No Transportation Needs (08/19/2022)   PRAPARE - Administrator, Civil Service (Medical): No    Lack of Transportation (Non-Medical): No  Physical Activity: Not on file  Stress: Not on file  Social Connections: Moderately Integrated (08/14/2022)   Social Connection and Isolation Panel [NHANES]    Frequency of Communication with Friends and Family: More than three times a week    Frequency of Social Gatherings with Friends and Family: More than three times a week    Attends Religious Services: 1 to 4 times per year    Active Member of [employer] or Organizations: Yes    Attends Banker Meetings: Never    Marital Status: Divorced   Water engineer and Provision:  Detailed education and instructions provided on heart failure disease management including the following:  Signs and symptoms of Heart Failure When to call the physician Importance of daily weights Low sodium diet Fluid restriction Medication management Anticipated future follow-up appointments  Patient education given on each of the above topics.  Patient acknowledges understanding via teach back method and acceptance of all instructions.  Education Materials:  "Living Better With Heart Failure" Booklet, HF zone tool, & Daily Weight Tracker Tool.  Patient has scale at home: yes Patient has pill box  at home: yes    High Risk Criteria for Readmission and/or Poor Patient Outcomes: Heart failure hospital admissions (last 6 months): 1 No Show rate: 17% Difficult social situation: no Demonstrates medication adherence: yes Primary Language: English Literacy level: able to read/write and comprehend. Wears reading glasses.  Barriers of Care:   -transportation limits -tobacco use  Considerations/Referrals:   Referral made to Heart Failure Pharmacist  Stewardship: yes, appreciated Referral made to Heart Failure CSW/NCM TOC: no Referral made to Heart & Vascular TOC clinic: yes, 9/6  Items for Follow-up on DC/TOC: -tobacco cessation -HF education: diet/fluid modification   Ozella Rocks, MSN, RN

## 2022-08-20 DIAGNOSIS — I5021 Acute systolic (congestive) heart failure: Secondary | ICD-10-CM | POA: Diagnosis not present

## 2022-08-20 LAB — BASIC METABOLIC PANEL
Anion gap: 9 (ref 5–15)
BUN: 51 mg/dL — ABNORMAL HIGH (ref 6–20)
CO2: 30 mmol/L (ref 22–32)
Calcium: 9.3 mg/dL (ref 8.9–10.3)
Chloride: 96 mmol/L — ABNORMAL LOW (ref 98–111)
Creatinine, Ser: 2.19 mg/dL — ABNORMAL HIGH (ref 0.44–1.00)
GFR, Estimated: 25 mL/min — ABNORMAL LOW (ref >60)
Glucose, Bld: 236 mg/dL — ABNORMAL HIGH (ref 70–99)
Potassium: 4.8 mmol/L (ref 3.5–5.1)
Sodium: 135 mmol/L (ref 135–145)

## 2022-08-20 LAB — CBC
HCT: 36 % (ref 36.0–46.0)
Hemoglobin: 11.2 g/dL — ABNORMAL LOW (ref 12.0–15.0)
MCH: 27.9 pg (ref 26.0–34.0)
MCHC: 31.1 g/dL (ref 30.0–36.0)
MCV: 89.8 fL (ref 80.0–100.0)
Platelets: 228 10*3/uL (ref 150–400)
RBC: 4.01 MIL/uL (ref 3.87–5.11)
RDW: 14.6 % (ref 11.5–15.5)
WBC: 9 10*3/uL (ref 4.0–10.5)
nRBC: 0 % (ref 0.0–0.2)

## 2022-08-20 LAB — GLUCOSE, CAPILLARY
Glucose-Capillary: 291 mg/dL — ABNORMAL HIGH (ref 70–99)
Glucose-Capillary: 249 mg/dL — ABNORMAL HIGH (ref 70–99)
Glucose-Capillary: 432 mg/dL — ABNORMAL HIGH (ref 70–99)
Glucose-Capillary: 335 mg/dL — ABNORMAL HIGH (ref 70–99)

## 2022-08-20 MED ORDER — SODIUM CHLORIDE 0.9% FLUSH
10.0000 mL | Freq: Two times a day (BID) | INTRAVENOUS | Status: DC
  Administered 2022-08-20 – 2022-08-22 (×4): 10 mL

## 2022-08-20 MED ORDER — METOPROLOL TARTRATE 50 MG PO TABS
50.0000 mg | ORAL_TABLET | Freq: Three times a day (TID) | ORAL | Status: DC
  Administered 2022-08-20 – 2022-08-22 (×6): 50 mg via ORAL
  Filled 2022-08-20 (×6): qty 1

## 2022-08-20 MED ORDER — DILTIAZEM HCL 60 MG PO TABS
60.0000 mg | ORAL_TABLET | Freq: Two times a day (BID) | ORAL | Status: DC
  Administered 2022-08-20 – 2022-08-22 (×4): 60 mg via ORAL
  Filled 2022-08-20 (×4): qty 1

## 2022-08-20 MED ORDER — ALBUMIN HUMAN 25 % IV SOLN
12.5000 g | Freq: Once | INTRAVENOUS | Status: AC
  Administered 2022-08-20: 12.5 g via INTRAVENOUS
  Filled 2022-08-20: qty 50

## 2022-08-20 MED ORDER — ENOXAPARIN SODIUM 30 MG/0.3ML IJ SOSY
30.0000 mg | PREFILLED_SYRINGE | Freq: Every day | INTRAMUSCULAR | Status: DC
  Administered 2022-08-20 – 2022-08-21 (×2): 30 mg via SUBCUTANEOUS
  Filled 2022-08-20 (×2): qty 0.3

## 2022-08-20 MED ORDER — SODIUM CHLORIDE 0.9% FLUSH: 10.0000 mL | INTRAVENOUS | Status: DC | PRN

## 2022-08-20 MED ORDER — CLONIDINE HCL 0.1 MG PO TABS
0.1000 mg | ORAL_TABLET | Freq: Every day | ORAL | Status: DC
  Administered 2022-08-20 – 2022-08-22 (×3): 0.1 mg via ORAL
  Filled 2022-08-20 (×2): qty 1

## 2022-08-20 MED ORDER — EMPAGLIFLOZIN 10 MG PO TABS: 10.0000 mg | ORAL_TABLET | Freq: Every day | ORAL | Status: DC

## 2022-08-20 NOTE — Progress Notes (Signed)
Pt. BP 94/70 with Metoprolol scheduled. On call for [facility] paged to make aware x2.

## 2022-08-20 NOTE — Plan of Care (Signed)
  Problem: Activity: Goal: Capacity to carry out activities will improve Outcome: Progressing   Problem: Fluid Volume: Goal: Ability to maintain a balanced intake and output will improve Outcome: Progressing

## 2022-08-20 NOTE — Progress Notes (Signed)
[employer] auth received ([id number], valid 8/31-9/4) and SW confirmed with Randa Lynn at [facility] they are prepared to admit pt pending medical clearance. MD updated.   Dellie Burns, MSW, LCSW [phone] (coverage)

## 2022-08-20 NOTE — Consult Note (Signed)
Ref: [facility]   Subjective:  Awake. Feeling better. Creatinine is up to 2.19 mg. Sinus tachycardia continues.  Objective:  Vital Signs in the last 24 hours: Temp:  [98 F (36.7 C)] 98 F (36.7 C) (08/31 0713) Pulse Rate:  [98] 98 (08/31 0713) Cardiac Rhythm: Normal sinus rhythm;Bundle branch block (08/31 0659) Resp:  [15-20] 15 (08/31 0713) BP: (106)/(71) 106/71 (08/31 0713) SpO2:  [96 %] 96 % (08/31 0713)  Physical Exam: BP Readings from Last 1 Encounters:  08/20/22 106/71     Wt Readings from Last 1 Encounters:  08/19/22 90.4 kg    Weight change:  Body mass index is 36.47 kg/m. HEENT: [place]/AT, Eyes-Brown, Conjunctiva-Pale pink, Sclera-Non-icteric Neck: No JVD, No bruit, Trachea midline. Lungs:  Clear, Bilateral. Cardiac:  Regular rhythm, normal S1 and S2, no S3. II/VI systolic murmur. Abdomen:  Soft, non-tender. BS present. Extremities:  No edema present. No cyanosis. No clubbing. CNS: AxOx3, Cranial nerves grossly intact, moves all 4 extremities.  Skin: Warm and dry.   Intake/Output from previous day: 08/30 0701 - 08/31 0700 In: -  Out: 1350 [Urine:1350]    Lab Results: BMET    Component Value Date/Time   NA 135 08/20/2022 0326   NA 135 08/19/2022 0630   NA 135 08/18/2022 0348   K 4.8 08/20/2022 0326   K 5.0 08/19/2022 0630   K 4.4 08/18/2022 0348   CL 96 (L) 08/20/2022 0326   CL 89 (L) 08/19/2022 0630   CL 94 (L) 08/18/2022 0348   CO2 30 08/20/2022 0326   CO2 29 08/19/2022 0630   CO2 29 08/18/2022 0348   GLUCOSE 236 (H) 08/20/2022 0326   GLUCOSE 260 (H) 08/19/2022 0630   GLUCOSE 255 (H) 08/18/2022 0348   BUN 51 (H) 08/20/2022 0326   BUN 45 (H) 08/19/2022 0630   BUN 39 (H) 08/18/2022 0348   CREATININE 2.19 (H) 08/20/2022 0326   CREATININE 1.98 (H) 08/19/2022 0630   CREATININE 1.62 (H) 08/18/2022 0348   CALCIUM 9.3 08/20/2022 0326   CALCIUM 10.4 (H) 08/19/2022 0630   CALCIUM 9.6 08/18/2022 0348   CALCIUM 5.3 (LL) 07/20/2022 1216    GFRNONAA 25 (L) 08/20/2022 0326   GFRNONAA 29 (L) 08/19/2022 0630   GFRNONAA 37 (L) 08/18/2022 0348   GFRAA >60 06/18/2020 1139   GFRAA >60 09/21/2019 0900   GFRAA >60 10/04/2018 1551   CBC    Component Value Date/Time   WBC 9.0 08/20/2022 0326   RBC 4.01 08/20/2022 0326   HGB 11.2 (L) 08/20/2022 0326   HCT 36.0 08/20/2022 0326   PLT 228 08/20/2022 0326   MCV 89.8 08/20/2022 0326   MCH 27.9 08/20/2022 0326   MCHC 31.1 08/20/2022 0326   RDW 14.6 08/20/2022 0326   LYMPHSABS 5.0 (H) 08/19/2022 0630   MONOABS 0.7 08/19/2022 0630   EOSABS 0.1 08/19/2022 0630   BASOSABS 0.0 08/19/2022 0630   HEPATIC Function Panel Recent Labs    07/21/22 0256 07/21/22 1038 07/22/22 0708 07/23/22 0556 08/14/22 0425  PROT 5.9*   < > 5.5* 5.4* 5.6*  ALBUMIN 2.6*   < > 2.3* 2.1* 2.7*  AST 17   < > 14* 17 14*  ALT 16   < > 11 19 14   ALKPHOS 65   < > 55 65 53  BILIDIR <0.1  --   --   --   --   IBILI NOT CALCULATED  --   --   --   --    < > =  values in this interval not displayed.   HEMOGLOBIN A1C Lab Results  Component Value Date   MPG 148.46 04/22/2022   CARDIAC ENZYMES Lab Results  Component Value Date   CKTOTAL 258 (H) 04/21/2022   BNP No results for input(s): "PROBNP" in the last 8760 hours. TSH Recent Labs    04/08/22 0623 04/21/22 0343  TSH 1.328 1.286   CHOLESTEROL Recent Labs    04/09/22 0740 04/22/22 0144  CHOL 325* 170    Scheduled Meds:  ALPRAZolam  1 mg Oral BID   atorvastatin  80 mg Oral Daily   calcium carbonate  1 tablet Oral BID WC   cloNIDine  0.1 mg Oral Daily   clopidogrel  75 mg Oral Daily   diltiazem  60 mg Oral BID   enoxaparin (LOVENOX) injection  30 mg Subcutaneous Daily   insulin aspart  0-15 Units Subcutaneous TID WC   insulin aspart  6 Units Subcutaneous TID WC   insulin detemir  30 Units Subcutaneous Daily   metoprolol tartrate  50 mg Oral TID   pantoprazole  40 mg Oral BID AC   predniSONE  5 mg Oral Q breakfast   traZODone  150 mg  Oral QHS   Continuous Infusions:  albumin human     PRN Meds:.HYDROcodone-acetaminophen, metoprolol tartrate, tiZANidine  Assessment/Plan:  Sinus tachycardia CAD HTN COPD Obesity S/P CVA CKD, IV Type 2 DM Hypoalbuminemia  Plan: Increase metoprolol to 50 mg. 3 times daily. Agree with DC isosorbide. Albumin supplement   LOS: 6 days   Time spent including chart review, lab review, examination, discussion with patient :  min   Orpah Cobb  MD  08/20/2022, 9:53 AM

## 2022-08-20 NOTE — Inpatient Diabetes Management (Signed)
Inpatient Diabetes Program Recommendations  AACE/ADA: New Consensus Statement on Inpatient Glycemic Control (2015)  Target Ranges:  Prepandial:   less than 140 mg/dL      Peak postprandial:   less than 180 mg/dL (1-2 hours)      Critically ill patients:  140 - 180 mg/dL   Lab Results  Component Value Date   GLUCAP 291 (H) 08/20/2022   HGBA1C 6.8 (H) 04/22/2022    Review of Glycemic Control  Latest Reference Range & Units 08/19/22 06:26 08/19/22 11:12 08/19/22 15:51 08/19/22 21:01 08/20/22 06:06  Glucose-Capillary 70 - 99 mg/dL 269 (H) 485 (H) 462 (H) 226 (H) 291 (H)   Diabetes history: DM 2 Outpatient Diabetes medications: Levemir 20 units qhs, Humalog 2-12 units tid, Metformin 500 mg bid Current orders for Inpatient glycemic control:  Levemir 30 units Daily Novolog 0-15 units tid Jardiance 10 mg Daily Novolog 6 units tid meal coverage PO Prednisone 5 mg Daily  Inpatient Diabetes Program Recommendations:    -   Increase Levemir to 35 units -   Consider increasing meal coverage to Novolog 8 units tid if eating >50% of meals  Thanks,  Christena Deem RN, MSN, BC-ADM Inpatient Diabetes Coordinator Team Pager [phone] (8a-5p)

## 2022-08-20 NOTE — Progress Notes (Signed)
PROGRESS NOTE    Erin Jackson  HUT:[id number] DOB: Sep 26, 1966 DOA: 08/13/2022 PCP: [facility]  56/F with history of chronic systolic EF 40-45% and diastolic CHF, moderate nonobstructive CAD, history of CVA, CAD, CKD 3b, hypertension, type 2 diabetes mellitus presented to the ED with fluid overload, dyspnea on exertion.  Improved with diuresis, clonidine was discontinued, developed tachycardia on hydralazine and with discontinuation of clonidine -complicated by AKI   Subjective: -Feels better overall, denies any dizziness, breathing better, heart rate is improving  Assessment and Plan:  Acute on chronic systolic and diastolic CHF Sinus tachycardia -Echo with EF 40-45%, global hypokinesis, preserved RV function -Diuresed well with IV Lasix she is 14.8 L negative -Was taken off clonidine, developed tachycardia -Cards following, low-dose clonidine resumed and beta-blocker dose increased, digoxin may be a good option -GDMT limited by AKI/CKD -Discontinue Imdur -Monitor on telemetry  AKI on CKD 3b Hypocalcemia and hypomagnesemia.  Baseline creatinine around 1.4, now up to 2.1 now -Likely multifactorial, cardiorenal component with tachycardia and diuresis -Holding IV Lasix, with hypotension, meds as above for heart rate -Monitor urine output, BMP in a.m.  CAD (coronary artery disease) -Stable, Continue with clopidogrel and statin therapy.   GERD (gastroesophageal reflux disease) Continue with pantoprazole.   History of CVA (cerebrovascular accident) -Continue Plavix and statin  Type 2 diabetes mellitus with hyperlipidemia (HCC) Uncontrolled hyperglycemia.  -Increase Levemir and meal coverage insulin  Vision loss of left eye Patient with chronic left eye vision loss.  Previously empirically started on prednisone for suspected temporal arthritis.  However had biopsy on 6/13, temporal arteritis was ruled out, has since been on prednisone taper -Was down to 15 Mg of  prednisone recently, now on 5 Mg, wean it off this week  Anxiety Continue alprazolam 1 mg twice daily per home regimen  Vitamin D deficiency Continue q7 weekly vitamin D pending med rec from rehab for timing of last dose  Class 2 obesity Calculated BMI 39.0   DVT prophylaxis: Lovenox Code Status: Full code Family Communication: Discussed with patient detail, no family at bedside Disposition Plan: SNF likely 1 to 2 days  Consultants:    Procedures:   Antimicrobials:    Objective: Vitals:   08/19/22 1142 08/19/22 1300 08/20/22 0713 08/20/22 1124  BP:   106/71 (!) 127/90  Pulse:   98 (!) 106  Resp: 20 19 15    Temp:   98 F (36.7 C)   TempSrc:   Oral   SpO2:   96%   Weight:      Height:        Intake/Output Summary (Last 24 hours) at 08/20/2022 1327 Last data filed at 08/20/2022 1251 Gross per 24 hour  Intake 360 ml  Output 600 ml  Net -240 ml   Filed Weights   08/17/22 0536 08/18/22 0403 08/19/22 0516  Weight: 91.9 kg 91.4 kg 90.4 kg    Examination:  General exam: Obese pleasant female sitting up in bed, AAOx3, no distress Neck obese unable to assess JVD CVS: S1-S2, regular rhythm, tachycardic Lungs: Clear abdomen: Soft, nontender, bowel sounds present Extremities: no edema Skin: No rashes Psychiatry:  Mood & affect appropriate.     Data Reviewed:   CBC: Recent Labs  Lab 08/13/22 1911 08/14/22 0425 08/19/22 0630 08/20/22 0326  WBC 11.8* 11.2* 9.8 9.0  NEUTROABS 6.8  --  4.0  --   HGB 10.7* 10.1* 12.8 11.2*  HCT 34.0* 31.7* 39.2 36.0  MCV 92.1 89.8 87.9 89.8  PLT  245 241 249 228   Basic Metabolic Panel: Recent Labs  Lab 08/13/22 1911 08/14/22 0425 08/14/22 1654 08/15/22 0545 08/16/22 0329 08/17/22 0621 08/18/22 0348 08/19/22 0630 08/20/22 0326  NA 138   < >  --  138 139 138 135 135 135  K 4.3   < >  --  4.3 4.3 4.4 4.4 5.0 4.8  CL 101   < >  --  98 98 94* 94* 89* 96*  CO2 25   < >  --  30 31 31 29 29 30   GLUCOSE 193*   < >  --   203* 242* 232* 255* 260* 236*  BUN 25*   < >  --  33* 33* 35* 39* 45* 51*  CREATININE 1.54*   < >  --  1.41* 1.33* 1.49* 1.62* 1.98* 2.19*  CALCIUM 7.6*   < >  --  8.8* 8.9 9.6 9.6 10.4* 9.3  MG <0.5*   < > 1.6* 2.3  --  1.3* 1.9 1.7  --   PHOS 3.2  --   --   --   --   --   --   --   --    < > = values in this interval not displayed.   GFR: Estimated Creatinine Clearance: 29.3 mL/min (A) (by C-G formula based on SCr of 2.19 mg/dL (H)). Liver Function Tests: Recent Labs  Lab 08/14/22 0425  AST 14*  ALT 14  ALKPHOS 53  BILITOT 0.4  PROT 5.6*  ALBUMIN 2.7*   No results for input(s): "LIPASE", "AMYLASE" in the last 168 hours. No results for input(s): "AMMONIA" in the last 168 hours. Coagulation Profile: No results for input(s): "INR", "PROTIME" in the last 168 hours. Cardiac Enzymes: No results for input(s): "CKTOTAL", "CKMB", "CKMBINDEX", "TROPONINI" in the last 168 hours. BNP (last 3 results) No results for input(s): "PROBNP" in the last 8760 hours. HbA1C: No results for input(s): "HGBA1C" in the last 72 hours. CBG: Recent Labs  Lab 08/19/22 1112 08/19/22 1551 08/19/22 2101 08/20/22 0606 08/20/22 1109  GLUCAP 241* 298* 226* 291* 249*   Lipid Profile: No results for input(s): "CHOL", "HDL", "LDLCALC", "TRIG", "CHOLHDL", "LDLDIRECT" in the last 72 hours. Thyroid Function Tests: No results for input(s): "TSH", "T4TOTAL", "FREET4", "T3FREE", "THYROIDAB" in the last 72 hours. Anemia Panel: No results for input(s): "VITAMINB12", "FOLATE", "FERRITIN", "TIBC", "IRON", "RETICCTPCT" in the last 72 hours. Urine analysis:    Component Value Date/Time   COLORURINE STRAW (A) 06/14/2022 2128   APPEARANCEUR CLEAR 06/14/2022 2128   LABSPEC 1.008 06/14/2022 2128   PHURINE 6.0 06/14/2022 2128   GLUCOSEU NEGATIVE 06/14/2022 2128   HGBUR SMALL (A) 06/14/2022 2128   BILIRUBINUR NEGATIVE 06/14/2022 2128   KETONESUR NEGATIVE 06/14/2022 2128   PROTEINUR 100 (A) 06/14/2022 2128    NITRITE NEGATIVE 06/14/2022 2128   LEUKOCYTESUR NEGATIVE 06/14/2022 2128   Sepsis Labs: @LABRCNTIP (procalcitonin:4,lacticidven:4)  )No results found for this or any previous visit (from the past 240 hour(s)).   Radiology Studies: No results found.   Scheduled Meds:  ALPRAZolam  1 mg Oral BID   atorvastatin  80 mg Oral Daily   calcium carbonate  1 tablet Oral BID WC   cloNIDine  0.1 mg Oral Daily   clopidogrel  75 mg Oral Daily   diltiazem  60 mg Oral BID   enoxaparin (LOVENOX) injection  30 mg Subcutaneous Daily   insulin aspart  0-15 Units Subcutaneous TID WC   insulin aspart  6 Units Subcutaneous TID  WC   insulin detemir  30 Units Subcutaneous Daily   metoprolol tartrate  50 mg Oral TID   pantoprazole  40 mg Oral BID AC   predniSONE  5 mg Oral Q breakfast   traZODone  150 mg Oral QHS   Continuous Infusions:  albumin human       LOS: 6 days    Time spent:    Zannie Cove, MD [facility]   08/20/2022, 1:27 PM

## 2022-08-20 NOTE — Progress Notes (Signed)

## 2022-08-20 NOTE — Progress Notes (Addendum)
Heart Failure Stewardship Pharmacist Progress Note   PCP: [facility] PCP-Cardiologist: None    HPI:  56 yo F with PMH of CAD, HFrEF, CKD III, PVD, electrolyte abnormalities, stroke, HTN, and T2DM.  Her last ECHO and bubble study was done on 5/3 and showed LVEF 40-45% (previously 35-40% in 03/2022), global hypokinesis, mild LVH, G2DD, normal RV, and negative bubble study. LHC 03/2022 with moderate CAD.  Patient recently hospitalized from 07/20/22 to 07/27/2022 for hypocalcemia likely secondary to vitamin D deficiency and hypomagnesemia.  She was discharged on oral calcium supplement and vitamin D 50 K units weekly.  She was also treated for aspiration pneumonia and had EGD due to dysphagia with noted small hiatal hernia and gastritis.  She was started on Protonix and Carafate.  Patient ultimately discharged to rehab since she had previous left eye vision loss, frailty and fall risk and felt unsafe to be home alone.  She presented to the ED on 08/13/22 with worsening shortness of breath, LE edema, and tachycardia. CXR with concerns for mild pulmonary edema. Clonidine was discontinued and she was started on hydralazine/nitrate. She then developed sinus tachycardia with clonidine discontinuation. Clonidine has been restarted at 50% dose and is tapering off. Diltiazem has been added and increased for rate control.   Current HF Medications: Beta Blocker: metoprolol tartrate 50 mg TID  Prior to admission HF Medications: Diuretic: furosemide 40 mg BID Beta blocker: metoprolol XL 25 mg daily ACE/ARB/ARNI: losartan 100 mg BID Other: hydralazine 25 mg TID  Pertinent Lab Values: Serum creatinine 2.19, BUN 51, Potassium 4.8, Sodium 135, BNP 63.8, Magnesium 1.7 (8/30), A1c 6.8  Vital Signs: Weight: 199 lbs (admission weight: 210 lbs) Blood pressure: 110-120/80s  Heart rate: 90-110s (ST)  I/O: -1.4L yesterday; net -15L (not well documented)  Medication Assistance / Insurance Benefits  Check: Does the patient have prescription insurance?  Yes Type of insurance plan: UHC Medicare + [place] Medicaid  Outpatient Pharmacy:  Prior to admission outpatient pharmacy: SNF   Assessment: 1. Acute on chronic systolic CHF (LVEF 40%), due to NICM (moderate disease on [facility] 03/2022). NYHA class II symptoms.  - Continue to hold diuretics - not overtly volume overloaded on exam - Agree with increasing metoprolol tartrate to 50 mg TID. Will need to consolidate to succinate for discharge once dose determined. - ARB and SGLT2i on hold with AKI, no room to add MRA - creatinine up to 2.19 today (baseline ~1.5) - Diltiazem was added and dose increased for rate control, with HFrEF, concerned this could worsen HF over time. May be better to use ivabradine 5 mg BID for temporary rate control during clonidine/hydralazine taper.    Plan: 1) Medication changes recommended at this time: - Stop diltiazem - Start ivabradine (Corlanor) 5 mg BID - Consolidate BB to metoprolol succinate for discharge once optimal dose determined  2) Patient assistance: - None needed; going back to SNF on discharge  Sharen Hones, PharmD, BCPS Heart Failure Stewardship Pharmacist Phone [phone]

## 2022-08-21 DIAGNOSIS — I5021 Acute systolic (congestive) heart failure: Secondary | ICD-10-CM | POA: Diagnosis not present

## 2022-08-21 LAB — CBC
HCT: 32.2 % — ABNORMAL LOW (ref 36.0–46.0)
Hemoglobin: 9.8 g/dL — ABNORMAL LOW (ref 12.0–15.0)
MCH: 27.9 pg (ref 26.0–34.0)
MCHC: 30.4 g/dL (ref 30.0–36.0)
MCV: 91.7 fL (ref 80.0–100.0)
Platelets: 182 10*3/uL (ref 150–400)
RBC: 3.51 MIL/uL — ABNORMAL LOW (ref 3.87–5.11)
RDW: 14.7 % (ref 11.5–15.5)
WBC: 8.4 10*3/uL (ref 4.0–10.5)
nRBC: 0 % (ref 0.0–0.2)

## 2022-08-21 LAB — GLUCOSE, CAPILLARY
Glucose-Capillary: 203 mg/dL — ABNORMAL HIGH (ref 70–99)
Glucose-Capillary: 263 mg/dL — ABNORMAL HIGH (ref 70–99)
Glucose-Capillary: 268 mg/dL — ABNORMAL HIGH (ref 70–99)

## 2022-08-21 LAB — BASIC METABOLIC PANEL
Anion gap: 9 (ref 5–15)
BUN: 51 mg/dL — ABNORMAL HIGH (ref 6–20)
CO2: 28 mmol/L (ref 22–32)
Calcium: 9.1 mg/dL (ref 8.9–10.3)
Chloride: 100 mmol/L (ref 98–111)
Creatinine, Ser: 1.81 mg/dL — ABNORMAL HIGH (ref 0.44–1.00)
GFR, Estimated: 32 mL/min — ABNORMAL LOW (ref >60)
Glucose, Bld: 218 mg/dL — ABNORMAL HIGH (ref 70–99)
Potassium: 4.5 mmol/L (ref 3.5–5.1)
Sodium: 137 mmol/L (ref 135–145)

## 2022-08-21 MED ORDER — INSULIN DETEMIR 100 UNIT/ML ~~LOC~~ SOLN
40.0000 [IU] | Freq: Every day | SUBCUTANEOUS | Status: DC
  Administered 2022-08-21: 40 [IU] via SUBCUTANEOUS
  Filled 2022-08-21 (×2): qty 0.4

## 2022-08-21 MED ORDER — TORSEMIDE 20 MG PO TABS
20.0000 mg | ORAL_TABLET | ORAL | Status: DC
  Filled 2022-08-21: qty 1

## 2022-08-21 MED ORDER — ENOXAPARIN SODIUM 40 MG/0.4ML IJ SOSY
40.0000 mg | PREFILLED_SYRINGE | Freq: Every day | INTRAMUSCULAR | Status: DC
  Filled 2022-08-21: qty 0.4

## 2022-08-21 NOTE — Progress Notes (Signed)
PROGRESS NOTE    Erin Jackson  EVO:[id number] DOB: 1966-03-27 DOA: 08/13/2022 PCP: [facility]  56/F with history of chronic systolic EF 40-45% and diastolic CHF, moderate nonobstructive CAD, history of CVA, CAD, CKD 3b, hypertension, type 2 diabetes mellitus presented to the ED with fluid overload, dyspnea on exertion.  Improved with diuresis, clonidine was discontinued, developed tachycardia on hydralazine and with discontinuation of clonidine -complicated by AKI   Subjective: -Feels better overall, weight is going up a little  Assessment and Plan:  Acute on chronic systolic and diastolic CHF Sinus tachycardia -Echo with EF 40-45%, global hypokinesis, preserved RV function -Diuresed well with IV Lasix she is 14.8 L negative -Was taken off clonidine, developed tachycardia -Cards following, low-dose clonidine resumed and beta-blocker dose increased, digoxin may be a good option -GDMT limited by AKI/CKD -Discontinued Imdur, resume diuretics, will discuss with cardiology -Creatinine improving, discharge planning  AKI on CKD 3b Hypocalcemia and hypomagnesemia.  Baseline creatinine around 1.4, peaked at 2.1, now 1.8 -Likely multifactorial, cardiorenal component with tachycardia and diuresis -Holding IV Lasix, with hypotension, meds as above for heart rate -Improving, resume torsemide likely tomorrow  CAD (coronary artery disease) -Stable, Continue with clopidogrel and statin therapy.   GERD (gastroesophageal reflux disease) Continue with pantoprazole.   History of CVA (cerebrovascular accident) -Continue Plavix and statin  Type 2 diabetes mellitus with hyperlipidemia (HCC) Uncontrolled hyperglycemia.  -Increase Levemir further, continue meal coverage  Vision loss of left eye Patient with chronic left eye vision loss.  Previously empirically started on prednisone for suspected temporal arthritis.  However had biopsy on 6/13, temporal arteritis was ruled out, has  since been on prednisone taper -Was down to 15 Mg of prednisone recently, now on 5 Mg, wean it off this week  Anxiety Continue alprazolam 1 mg twice daily per home regimen  Vitamin D deficiency Continue q7 weekly vitamin D pending med rec from rehab for timing of last dose  Class 2 obesity Calculated BMI 39.0   DVT prophylaxis: Lovenox Code Status: Full code Family Communication: Discussed with patient detail, no family at bedside Disposition Plan: SNF tomorrow  Consultants:    Procedures:   Antimicrobials:    Objective: Vitals:   08/21/22 0500 08/21/22 0539 08/21/22 0545 08/21/22 0945  BP:  123/60  (!) 151/75  Pulse:  97  (!) 109  Resp: 18 18  19   Temp:  97.9 F (36.6 C)  98.7 F (37.1 C)  TempSrc:  Oral  Oral  SpO2:  99%  95%  Weight:   92.1 kg   Height:        Intake/Output Summary (Last 24 hours) at 08/21/2022 1221 Last data filed at 08/21/2022 10/21/2022 Gross per 24 hour  Intake 1020 ml  Output 950 ml  Net 70 ml   Filed Weights   08/18/22 0403 08/19/22 0516 08/21/22 0545  Weight: 91.4 kg 90.4 kg 92.1 kg    Examination:  General exam: Obese pleasant female sitting up in bed, AAOx3, no distress Neck obese unable to assess JVD CVS: S1-S2, regular rhythm, tachycardic Lungs: Clear abdomen: Soft, nontender, bowel sounds present Extremities: no edema Skin: No rashes Psychiatry:  Mood & affect appropriate.     Data Reviewed:   CBC: Recent Labs  Lab 08/19/22 0630 08/20/22 0326 08/21/22 0441  WBC 9.8 9.0 8.4  NEUTROABS 4.0  --   --   HGB 12.8 11.2* 9.8*  HCT 39.2 36.0 32.2*  MCV 87.9 89.8 91.7  PLT 249 228 182  Basic Metabolic Panel: Recent Labs  Lab 08/14/22 1654 08/15/22 0545 08/16/22 0329 08/17/22 9798 08/18/22 0348 08/19/22 0630 08/20/22 0326 08/21/22 0441  NA  --  138   < > 138 135 135 135 137  K  --  4.3   < > 4.4 4.4 5.0 4.8 4.5  CL  --  98   < > 94* 94* 89* 96* 100  CO2  --  30   < > 31 29 29 30 28   GLUCOSE  --  203*   < > 232*  255* 260* 236* 218*  BUN  --  33*   < > 35* 39* 45* 51* 51*  CREATININE  --  1.41*   < > 1.49* 1.62* 1.98* 2.19* 1.81*  CALCIUM  --  8.8*   < > 9.6 9.6 10.4* 9.3 9.1  MG 1.6* 2.3  --  1.3* 1.9 1.7  --   --    < > = values in this interval not displayed.   GFR: Estimated Creatinine Clearance: 35.8 mL/min (A) (by C-G formula based on SCr of 1.81 mg/dL (H)). Liver Function Tests: No results for input(s): "AST", "ALT", "ALKPHOS", "BILITOT", "PROT", "ALBUMIN" in the last 168 hours.  No results for input(s): "LIPASE", "AMYLASE" in the last 168 hours. No results for input(s): "AMMONIA" in the last 168 hours. Coagulation Profile: No results for input(s): "INR", "PROTIME" in the last 168 hours. Cardiac Enzymes: No results for input(s): "CKTOTAL", "CKMB", "CKMBINDEX", "TROPONINI" in the last 168 hours. BNP (last 3 results) No results for input(s): "PROBNP" in the last 8760 hours. HbA1C: No results for input(s): "HGBA1C" in the last 72 hours. CBG: Recent Labs  Lab 08/20/22 1109 08/20/22 1625 08/20/22 2142 08/21/22 0554 08/21/22 1148  GLUCAP 249* 432* 335* 203* 263*   Lipid Profile: No results for input(s): "CHOL", "HDL", "LDLCALC", "TRIG", "CHOLHDL", "LDLDIRECT" in the last 72 hours. Thyroid Function Tests: No results for input(s): "TSH", "T4TOTAL", "FREET4", "T3FREE", "THYROIDAB" in the last 72 hours. Anemia Panel: No results for input(s): "VITAMINB12", "FOLATE", "FERRITIN", "TIBC", "IRON", "RETICCTPCT" in the last 72 hours. Urine analysis:    Component Value Date/Time   COLORURINE STRAW (A) 06/14/2022 2128   APPEARANCEUR CLEAR 06/14/2022 2128   LABSPEC 1.008 06/14/2022 2128   PHURINE 6.0 06/14/2022 2128   GLUCOSEU NEGATIVE 06/14/2022 2128   HGBUR SMALL (A) 06/14/2022 2128   BILIRUBINUR NEGATIVE 06/14/2022 2128   KETONESUR NEGATIVE 06/14/2022 2128   PROTEINUR 100 (A) 06/14/2022 2128   NITRITE NEGATIVE 06/14/2022 2128   LEUKOCYTESUR NEGATIVE 06/14/2022 2128   Sepsis  Labs: @LABRCNTIP (procalcitonin:4,lacticidven:4)  )No results found for this or any previous visit (from the past 240 hour(s)).   Radiology Studies: No results found.   Scheduled Meds:  ALPRAZolam  1 mg Oral BID   atorvastatin  80 mg Oral Daily   calcium carbonate  1 tablet Oral BID WC   cloNIDine  0.1 mg Oral Daily   clopidogrel  75 mg Oral Daily   diltiazem  60 mg Oral BID   [START ON 08/22/2022] enoxaparin (LOVENOX) injection  40 mg Subcutaneous Daily   insulin aspart  0-15 Units Subcutaneous TID WC   insulin aspart  6 Units Subcutaneous TID WC   insulin detemir  40 Units Subcutaneous Daily   metoprolol tartrate  50 mg Oral TID   pantoprazole  40 mg Oral BID AC   predniSONE  5 mg Oral Q breakfast   sodium chloride flush  10-40 mL Intracatheter Q12H   [START ON 08/22/2022]  torsemide  20 mg Oral Q M,W,F   traZODone  150 mg Oral QHS   Continuous Infusions:     LOS: 7 days    Time spent:    Zannie Cove, MD [facility]   08/21/2022, 12:21 PM

## 2022-08-21 NOTE — Consult Note (Signed)
Ref: [facility]   Subjective:  Awake. HR control is improved. Creatinine is stable at 1.81 mg. Ambulating some.  Objective:  Vital Signs in the last 24 hours: Temp:  [97.9 F (36.6 C)-98.8 F (37.1 C)] 98.7 F (37.1 C) (09/01 0945) Pulse Rate:  [97-111] 109 (09/01 0945) Cardiac Rhythm: Sinus tachycardia (09/01 0700) Resp:  [17-27] 19 (09/01 0945) BP: (94-151)/(60-88) 151/75 (09/01 0945) SpO2:  [95 %-99 %] 95 % (09/01 0945) Weight:  [92.1 kg] 92.1 kg (09/01 0545)  Physical Exam: BP Readings from Last 1 Encounters:  08/21/22 (!) 151/75     Wt Readings from Last 1 Encounters:  08/21/22 92.1 kg    Weight change:  Body mass index is 37.13 kg/m. HEENT: [place]/AT, Eyes-Brown, Conjunctiva-Pale pink, Sclera-Non-icteric Neck: No JVD, No bruit, Trachea midline. Lungs:  Clear, Bilateral. Cardiac:  Regular rhythm, normal S1 and S2, no S3. II/VI systolic murmur. Abdomen:  Soft, non-tender. BS present. Extremities:  No edema present. No cyanosis. No clubbing. CNS: AxOx3, Cranial nerves grossly intact, moves all 4 extremities.  Skin: Warm and dry.   Intake/Output from previous day: 08/31 0701 - 09/01 0700 In: 1010 [P.O.:960; IV Piggyback:50] Out: 950 [Urine:950]    Lab Results: BMET    Component Value Date/Time   NA 137 08/21/2022 0441   NA 135 08/20/2022 0326   NA 135 08/19/2022 0630   K 4.5 08/21/2022 0441   K 4.8 08/20/2022 0326   K 5.0 08/19/2022 0630   CL 100 08/21/2022 0441   CL 96 (L) 08/20/2022 0326   CL 89 (L) 08/19/2022 0630   CO2 28 08/21/2022 0441   CO2 30 08/20/2022 0326   CO2 29 08/19/2022 0630   GLUCOSE 218 (H) 08/21/2022 0441   GLUCOSE 236 (H) 08/20/2022 0326   GLUCOSE 260 (H) 08/19/2022 0630   BUN 51 (H) 08/21/2022 0441   BUN 51 (H) 08/20/2022 0326   BUN 45 (H) 08/19/2022 0630   CREATININE 1.81 (H) 08/21/2022 0441   CREATININE 2.19 (H) 08/20/2022 0326   CREATININE 1.98 (H) 08/19/2022 0630   CALCIUM 9.1 08/21/2022 0441   CALCIUM 9.3  08/20/2022 0326   CALCIUM 10.4 (H) 08/19/2022 0630   CALCIUM 5.3 (LL) 07/20/2022 1216   GFRNONAA 32 (L) 08/21/2022 0441   GFRNONAA 25 (L) 08/20/2022 0326   GFRNONAA 29 (L) 08/19/2022 0630   GFRAA >60 06/18/2020 1139   GFRAA >60 09/21/2019 0900   GFRAA >60 10/04/2018 1551   CBC    Component Value Date/Time   WBC 8.4 08/21/2022 0441   RBC 3.51 (L) 08/21/2022 0441   HGB 9.8 (L) 08/21/2022 0441   HCT 32.2 (L) 08/21/2022 0441   PLT 182 08/21/2022 0441   MCV 91.7 08/21/2022 0441   MCH 27.9 08/21/2022 0441   MCHC 30.4 08/21/2022 0441   RDW 14.7 08/21/2022 0441   LYMPHSABS 5.0 (H) 08/19/2022 0630   MONOABS 0.7 08/19/2022 0630   EOSABS 0.1 08/19/2022 0630   BASOSABS 0.0 08/19/2022 0630   HEPATIC Function Panel Recent Labs    07/21/22 0256 07/21/22 1038 07/22/22 0708 07/23/22 0556 08/14/22 0425  PROT 5.9*   < > 5.5* 5.4* 5.6*  ALBUMIN 2.6*   < > 2.3* 2.1* 2.7*  AST 17   < > 14* 17 14*  ALT 16   < > 11 19 14   ALKPHOS 65   < > 55 65 53  BILIDIR <0.1  --   --   --   --   IBILI NOT CALCULATED  --   --   --   --    < > =  values in this interval not displayed.   HEMOGLOBIN A1C Lab Results  Component Value Date   MPG 148.46 04/22/2022   CARDIAC ENZYMES Lab Results  Component Value Date   CKTOTAL 258 (H) 04/21/2022   BNP No results for input(s): "PROBNP" in the last 8760 hours. TSH Recent Labs    04/08/22 0623 04/21/22 0343  TSH 1.328 1.286   CHOLESTEROL Recent Labs    04/09/22 0740 04/22/22 0144  CHOL 325* 170    Scheduled Meds:  ALPRAZolam  1 mg Oral BID   atorvastatin  80 mg Oral Daily   calcium carbonate  1 tablet Oral BID WC   cloNIDine  0.1 mg Oral Daily   clopidogrel  75 mg Oral Daily   diltiazem  60 mg Oral BID   enoxaparin (LOVENOX) injection  30 mg Subcutaneous Daily   insulin aspart  0-15 Units Subcutaneous TID WC   insulin aspart  6 Units Subcutaneous TID WC   insulin detemir  40 Units Subcutaneous Daily   metoprolol tartrate  50 mg Oral TID    pantoprazole  40 mg Oral BID AC   predniSONE  5 mg Oral Q breakfast   sodium chloride flush  10-40 mL Intracatheter Q12H   [START ON 08/22/2022] torsemide  20 mg Oral Q M,W,F   traZODone  150 mg Oral QHS   Continuous Infusions:  PRN Meds:.HYDROcodone-acetaminophen, metoprolol tartrate, sodium chloride flush, tiZANidine  Assessment/Plan:  Sinus tachycardia, resolved CAD HTN COPD Obesity S/P CVA CKD, IIIb Type 2 DM Hypoalbuminemia  Plan: Resume torsemide 20 mg. PO on M-W-F from next week. F/U in 1 week to 2 weeks at my office with medications.   LOS: 7 days   Time spent including chart review, lab review, examination, discussion with patient/Nurse : 30 min   Orpah Cobb  MD  08/21/2022, 12:06 PM

## 2022-08-21 NOTE — Progress Notes (Signed)
Occupational Therapy Treatment Patient Details Name: Erin Jackson MRN: [id number] DOB: December 17, 1966 Today's Date: 08/21/2022   History of present illness Patient is 56 y.o. female present to [facility] with 2 weeks of worsening dyspnea, associated with lower extremity edema. Pt with recent hospitalization 07/20/22 to 07/27/22 for hypocalcemia due to vitamin D deficiency and hypomagnesemia; hospitalization complicated with aspiration pneumonia and was discharged to SNF. PMH significant for heart failure, CVA, CAD, hypertension, CKD stage 3b and T2DM who presented with dyspnea.   OT comments  Patient seated on EOB upon entry. Patient was supervision to ambulated to sink for grooming and min guard assist for balance. Patient provided handout for energy conservation and reviewed with patient and patient able to recall 3/3 strategies following review. Patient is eager to return to [facility] for continued rehab.    Recommendations for follow up therapy are one component of a multi-disciplinary discharge planning process, led by the attending physician.  Recommendations may be updated based on patient status, additional functional criteria and insurance authorization.    Follow Up Recommendations  Skilled nursing-short term rehab (<3 hours/day)    Assistance Recommended at Discharge Intermittent Supervision/Assistance  Patient can return home with the following  A little help with walking and/or transfers;A little help with bathing/dressing/bathroom;Assistance with cooking/housework;Direct supervision/assist for medications management;Direct supervision/assist for financial management;Assist for transportation;Help with stairs or ramp for entrance   Equipment Recommendations  Other (comment) (Defer to next venue)    Recommendations for Other Services      Precautions / Restrictions Precautions Precautions: Fall Restrictions Weight Bearing Restrictions: No       Mobility Bed Mobility Overal bed  mobility: Needs Assistance             General bed mobility comments: pt seated EOB before and after session    Transfers Overall transfer level: Needs assistance Equipment used: Rolling walker (2 wheels) Transfers: Sit to/from Stand Sit to Stand: Supervision           General transfer comment: ambulated to sink to perform grooming and returned to EOB     Balance Overall balance assessment: Needs assistance Sitting-balance support: Feet supported, No upper extremity supported Sitting balance-Leahy Scale: Good Sitting balance - Comments: sits unsupported   Standing balance support: Bilateral upper extremity supported, During functional activity Standing balance-Leahy Scale: Poor Standing balance comment: reliant on RW or sink for support                           ADL either performed or assessed with clinical judgement   ADL Overall ADL's : Needs assistance/impaired     Grooming: Wash/dry hands;Wash/dry face;Oral care;Min guard;Standing Grooming Details (indicate cue type and reason): at sink                               General ADL Comments: performed grooming tasks standing at sink    Extremity/Trunk Assessment Upper Extremity Assessment RUE Deficits / Details: 3+/5 grip strength, 4-/5 push/pull, decreased speed of thumb opposition LUE Deficits / Details: 4-/5 grip strength, 4-/5 push/pull, decreased speed of thumb opposition; limited shoulder ROM from prior CVA            Vision   Additional Comments: blind in left eye   Perception     Praxis      Cognition Arousal/Alertness: Awake/alert Behavior During Therapy: WFL for tasks assessed/performed Overall Cognitive Status: Within Functional Limits  for tasks assessed                                 General Comments: able to recall energy conservation strategies after review        Exercises      Shoulder Instructions       General Comments provided  handout for energy conservation strategies and reviewed with patient.    Pertinent Vitals/ Pain       Pain Assessment Pain Assessment: No/denies pain Pain Intervention(s): Monitored during session  Home Living                                          Prior Functioning/Environment              Frequency  Min 2X/week        Progress Toward Goals  OT Goals(current goals can now be found in the care plan section)  Progress towards OT goals: Progressing toward goals  Acute Rehab OT Goals Patient Stated Goal: go back to rehab OT Goal Formulation: With patient Time For Goal Achievement: 09/02/22 Potential to Achieve Goals: Good ADL Goals Pt Will Perform Grooming: with modified independence;standing Pt Will Perform Lower Body Dressing: with modified independence;sit to/from stand Pt Will Transfer to Toilet: Independently;ambulating Pt Will Perform Toileting - Clothing Manipulation and hygiene: Independently;sit to/from stand;sitting/lateral leans Pt Will Perform Tub/Shower Transfer: Tub transfer;tub bench;rolling walker;ambulating Pt/caregiver will Perform Home Exercise Program: Increased strength;Both right and left upper extremity;With theraband;With written HEP provided;Independently Additional ADL Goal #1: Patient will verbalize strategies for energy conservation and fall prevention in preparation for safe discharge home. Additional ADL Goal #2: Patient will verbalize 3-4 strategies for managing CHF to promote independence and decrease further hospital admissions.  Plan Frequency remains appropriate;Discharge plan remains appropriate    Co-evaluation                 AM-PAC OT "6 Clicks" Daily Activity     Outcome Measure   Help from another person eating meals?: A Little Help from another person taking care of personal grooming?: A Little Help from another person toileting, which includes using toliet, bedpan, or urinal?: A Little Help from  another person bathing (including washing, rinsing, drying)?: A Lot Help from another person to put on and taking off regular upper body clothing?: A Little Help from another person to put on and taking off regular lower body clothing?: A Lot 6 Click Score: 16    End of Session Equipment Utilized During Treatment: Rolling walker (2 wheels)  OT Visit Diagnosis: Other abnormalities of gait and mobility (R26.89);Muscle weakness (generalized) (M62.81);Low vision, both eyes (H54.2);Pain;Dizziness and giddiness (R42);Other symptoms and signs involving cognitive function;Unsteadiness on feet (R26.81)   Activity Tolerance Patient tolerated treatment well   Patient Left in bed;with call bell/phone within reach (seated on EOB)   Nurse Communication Mobility status        Time: 4259-5638 OT Time Calculation (min): 34 min  Charges: OT General Charges $OT Visit: 1 Visit OT Treatments $Self Care/Home Management : 8-22 mins $Therapeutic Activity: 8-22 mins  Alfonse Flavors, OTA Acute Rehabilitation Services  Office [phone]   Dewain Penning 08/21/2022, 12:35 PM

## 2022-08-21 NOTE — Progress Notes (Signed)
Per MD, possible dc tomorrow. SW notified Randa Lynn in admissions at [facility] who confirmed they are able to admit pt over weekend. Pt's Berkley Harvey is valid through 9/4.   Weekend SW will need to contact the [employer] RN at [facility] to facilitate dc, pt will admit to room 106. SW will assist as indicated.   Dellie Burns, MSW, LCSW [phone] (coverage)

## 2022-08-21 NOTE — Progress Notes (Signed)
   08/21/22 1400  Mobility  Activity Ambulated with assistance in hallway  Level of Assistance Modified independent, requires aide device or extra time  Assistive Device Four wheel walker  Distance Ambulated (ft) 140 ft  Activity Response Tolerated well  $Mobility charge 1 Mobility   Mobility Specialist Progress Note  Pt was EOB and agreeable. Took X1 seated break for social break. Returned back to EOB w/all needs met and call bell in reach.   Kyla Jones Mobility Specialist   

## 2022-08-21 NOTE — Progress Notes (Signed)
Physical Therapy Treatment Patient Details Name: Erin Jackson MRN: [id number] DOB: [birth year] Today's Date: 08/21/2022   History of Present Illness Patient is 56 y.o. female present to [facility] with 2 weeks of worsening dyspnea, associated with lower extremity edema. Pt with recent hospitalization 07/20/22 to 07/27/22 for hypocalcemia due to vitamin D deficiency and hypomagnesemia; hospitalization complicated with aspiration pneumonia and was discharged to SNF. PMH significant for heart failure, CVA, CAD, hypertension, CKD stage 3b and T2DM who presented with dyspnea.    PT Comments    Today's skilled session continued to focus on mobility progression with increased gait distance and less overall assistance needed. Acute PT to continue during pt's hospital stay.    Recommendations for follow up therapy are one component of a multi-disciplinary discharge planning process, led by the attending physician.  Recommendations may be updated based on patient status, additional functional criteria and insurance authorization.  Follow Up Recommendations  Skilled nursing-short term rehab (<3 hours/day) Can patient physically be transported by private vehicle: Yes   Assistance Recommended at Discharge Intermittent Supervision/Assistance  Patient can return home with the following A little help with walking and/or transfers;A little help with bathing/dressing/bathroom;Help with stairs or ramp for entrance;Assist for transportation;Assistance with cooking/housework   Equipment Recommendations  None recommended by PT    Precautions / Restrictions Precautions Precautions: Fall Restrictions Weight Bearing Restrictions: No     Mobility  Bed Mobility         General bed mobility comments: pt seated EOB before and after session    Transfers Overall transfer level: Needs assistance Equipment used: Rollator (4 wheels) Transfers: Sit to/from Stand Sit to Stand: Supervision           General  transfer comment: sit<>stand x 3 in sesssion (x2 at bedside, x1 to/from rollator seat) with close supervision for safety, no assistance or cues needed.    Ambulation/Gait Ambulation/Gait assistance: Min guard Gait Distance (Feet): 200 Feet Assistive device: Rollator (4 wheels) Gait Pattern/deviations: Step-through pattern, Decreased stride length, Shuffle, Wide base of support Gait velocity: decreased Gait velocity interpretation: <1.31 ft/sec, indicative of household ambulator   General Gait Details: pt very distractable in hallway, stopping to talk to anyone she recongized from stay in hospital. no balance issues noted as pt scanned/turned in hallway with sesssion.       Cognition Arousal/Alertness: Awake/alert Behavior During Therapy: WFL for tasks assessed/performed Overall Cognitive Status: Within Functional Limits for tasks assessed                 Pertinent Vitals/Pain Pain Assessment Pain Assessment: No/denies pain     PT Goals (current goals can now be found in the care plan section) Acute Rehab PT Goals Patient Stated Goal: go to rehab PT Goal Formulation: With patient Time For Goal Achievement: 09/01/22 Potential to Achieve Goals: Good Progress towards PT goals: Progressing toward goals    Frequency    Min 2X/week      PT Plan Current plan remains appropriate    AM-PAC PT "6 Clicks" Mobility   Outcome Measure  Help needed turning from your back to your side while in a flat bed without using bedrails?: A Little Help needed moving from lying on your back to sitting on the side of a flat bed without using bedrails?: A Little Help needed moving to and from a bed to a chair (including a wheelchair)?: None Help needed standing up from a chair using your arms (e.g., wheelchair or bedside chair)?: None Help needed to  walk in hospital room?: A Little Help needed climbing 3-5 steps with a railing? : A Lot 6 Click Score: 19    End of Session   Activity  Tolerance: Patient tolerated treatment well Patient left: in bed;with call bell/phone within reach;with bed alarm set (seated EOB) Nurse Communication: Mobility status PT Visit Diagnosis: Muscle weakness (generalized) (M62.81);Difficulty in walking, not elsewhere classified (R26.2)     Time: 6568-1275 PT Time Calculation (min) (ACUTE ONLY): 31 min  Charges:  $Gait Training: 8-22 mins $Therapeutic Activity: 8-22 mins                    Sallyanne Kuster, PTA, [facility] Acute [employer] Office- [phone] 08/21/22, 11:11 AM    Sallyanne Kuster 08/21/2022, 11:11 AM

## 2022-08-21 NOTE — Inpatient Diabetes Management (Signed)
Inpatient Diabetes Program Recommendations  AACE/ADA: New Consensus Statement on Inpatient Glycemic Control (2015)  Target Ranges:  Prepandial:   less than 140 mg/dL      Peak postprandial:   less than 180 mg/dL (1-2 hours)      Critically ill patients:  140 - 180 mg/dL   Lab Results  Component Value Date   GLUCAP 203 (H) 08/21/2022   HGBA1C 6.8 (H) 04/22/2022    Review of Glycemic Control  Latest Reference Range & Units 08/20/22 06:06 08/20/22 11:09 08/20/22 16:25 08/20/22 21:42 08/21/22 05:54  Glucose-Capillary 70 - 99 mg/dL 269 (H) 485 (H) 462 (H) 335 (H) 203 (H)   Diabetes history: DM 2 Outpatient Diabetes medications: Levemir 20 units qhs, Humalog 2-12 units tid, Metformin 500 mg bid Current orders for Inpatient glycemic control:  Levemir 40 units Daily Novolog 0-15 units tid Jardiance 10 mg Daily Novolog 6 units tid meal coverage PO Prednisone 5 mg Daily  Noted Levemir increased to 40 units today  Inpatient Diabetes Program Recommendations:    -   Consider increasing meal coverage to Novolog 8 units tid if eating >50% of meals  Thanks,  Christena Deem RN, MSN, BC-ADM Inpatient Diabetes Coordinator Team Pager [phone] (8a-5p)

## 2022-08-21 NOTE — Progress Notes (Signed)
Heart Failure Stewardship Pharmacist Progress Note   PCP: [facility] PCP-Cardiologist: None    HPI:  56 yo F with PMH of CAD, HFrEF, CKD III, PVD, electrolyte abnormalities, stroke, HTN, and T2DM.  Her last ECHO and bubble study was done on 5/3 and showed LVEF 40-45% (previously 35-40% in 03/2022), global hypokinesis, mild LVH, G2DD, normal RV, and negative bubble study. LHC 03/2022 with moderate CAD.  Patient recently hospitalized from 07/20/22 to 07/27/2022 for hypocalcemia likely secondary to vitamin D deficiency and hypomagnesemia.  She was discharged on oral calcium supplement and vitamin D 50 K units weekly.  She was also treated for aspiration pneumonia and had EGD due to dysphagia with noted small hiatal hernia and gastritis.  She was started on Protonix and Carafate.  Patient ultimately discharged to rehab since she had previous left eye vision loss, frailty and fall risk and felt unsafe to be home alone.  She presented to the ED on 08/13/22 with worsening shortness of breath, LE edema, and tachycardia. CXR with concerns for mild pulmonary edema. Clonidine was discontinued and she was started on hydralazine/nitrate. She then developed sinus tachycardia with clonidine discontinuation. Clonidine has been restarted at 50% dose and is tapering off. Diltiazem has been added and increased for rate control.   Current HF Medications: Beta Blocker: metoprolol tartrate 50 mg TID **also on diltiazem 60 mg BID  Prior to admission HF Medications: Diuretic: furosemide 40 mg BID Beta blocker: metoprolol XL 25 mg daily ACE/ARB/ARNI: losartan 100 mg BID Other: hydralazine 25 mg TID  Pertinent Lab Values: Serum creatinine 1.81, BUN 51, Potassium 4.5, Sodium 137, BNP 63.8, Magnesium 1.7 (8/30), A1c 6.8  Vital Signs: Weight: 203 lbs (admission weight: 210 lbs) Blood pressure: 120-150/70s  Heart rate: 90-120s (ST)  I/O: - yesterday; net -14.5L (not well documented)  Medication  Assistance / Insurance Benefits Check: Does the patient have prescription insurance?  Yes Type of insurance plan: UHC Medicare + [place] Medicaid  Outpatient Pharmacy:  Prior to admission outpatient pharmacy: SNF   Assessment: 1. Acute on chronic systolic CHF (LVEF 40%), due to NICM (moderate disease on [facility] 03/2022). NYHA class II symptoms.  - Continue to hold diuretics - not overtly volume overloaded on exam, weight starting to trend up. May need to resume PO soon.  - Continue metoprolol tartrate 50 mg TID. Will need to consolidate to succinate for discharge once dose determined. - ARB and SGLT2i on hold with AKI, no room to add MRA (baseline Cr ~1.5) - Diltiazem was added and dose increased for rate control, with HFrEF, concerned this could worsen HF over time. May be better to use ivabradine 5 mg BID for temporary rate control during clonidine/hydralazine taper.    Plan: 1) Medication changes recommended at this time: - Stop diltiazem - Start ivabradine (Corlanor) 5 mg BID - Consolidate BB to metoprolol succinate for discharge once optimal dose determined  2) Patient assistance: - None needed; going back to SNF on discharge  Sharen Hones, PharmD, BCPS Heart Failure Stewardship Pharmacist Phone [phone]

## 2022-08-21 NOTE — Plan of Care (Signed)
  Problem: Activity: Goal: Capacity to carry out activities will improve Outcome: Progressing   Problem: Coping: Goal: Ability to adjust to condition or change in health will improve Outcome: Progressing   Problem: Fluid Volume: Goal: Ability to maintain a balanced intake and output will improve Outcome: Not Progressing  Pt. Non compliant with fluid intake. Requests fluids often.

## 2022-08-22 DIAGNOSIS — R54 Age-related physical debility: Secondary | ICD-10-CM | POA: Diagnosis not present

## 2022-08-22 DIAGNOSIS — E1122 Type 2 diabetes mellitus with diabetic chronic kidney disease: Secondary | ICD-10-CM | POA: Diagnosis not present

## 2022-08-22 DIAGNOSIS — Z7984 Long term (current) use of oral hypoglycemic drugs: Secondary | ICD-10-CM | POA: Diagnosis not present

## 2022-08-22 DIAGNOSIS — M5451 Vertebrogenic low back pain: Secondary | ICD-10-CM | POA: Diagnosis not present

## 2022-08-22 DIAGNOSIS — E8770 Fluid overload, unspecified: Secondary | ICD-10-CM | POA: Diagnosis not present

## 2022-08-22 DIAGNOSIS — D509 Iron deficiency anemia, unspecified: Secondary | ICD-10-CM | POA: Diagnosis not present

## 2022-08-22 DIAGNOSIS — E7800 Pure hypercholesterolemia, unspecified: Secondary | ICD-10-CM | POA: Diagnosis not present

## 2022-08-22 DIAGNOSIS — M79605 Pain in left leg: Secondary | ICD-10-CM | POA: Diagnosis not present

## 2022-08-22 DIAGNOSIS — Z8673 Personal history of transient ischemic attack (TIA), and cerebral infarction without residual deficits: Secondary | ICD-10-CM | POA: Diagnosis not present

## 2022-08-22 DIAGNOSIS — M79604 Pain in right leg: Secondary | ICD-10-CM | POA: Diagnosis not present

## 2022-08-22 DIAGNOSIS — H53132 Sudden visual loss, left eye: Secondary | ICD-10-CM | POA: Diagnosis not present

## 2022-08-22 DIAGNOSIS — I517 Cardiomegaly: Secondary | ICD-10-CM | POA: Diagnosis not present

## 2022-08-22 DIAGNOSIS — E0801 Diabetes mellitus due to underlying condition with hyperosmolarity with coma: Secondary | ICD-10-CM | POA: Diagnosis not present

## 2022-08-22 DIAGNOSIS — I739 Peripheral vascular disease, unspecified: Secondary | ICD-10-CM | POA: Diagnosis not present

## 2022-08-22 DIAGNOSIS — I2510 Atherosclerotic heart disease of native coronary artery without angina pectoris: Secondary | ICD-10-CM | POA: Diagnosis not present

## 2022-08-22 DIAGNOSIS — Z7902 Long term (current) use of antithrombotics/antiplatelets: Secondary | ICD-10-CM | POA: Diagnosis not present

## 2022-08-22 DIAGNOSIS — I10 Essential (primary) hypertension: Secondary | ICD-10-CM | POA: Diagnosis not present

## 2022-08-22 DIAGNOSIS — E785 Hyperlipidemia, unspecified: Secondary | ICD-10-CM | POA: Diagnosis not present

## 2022-08-22 DIAGNOSIS — E119 Type 2 diabetes mellitus without complications: Secondary | ICD-10-CM | POA: Diagnosis not present

## 2022-08-22 DIAGNOSIS — R296 Repeated falls: Secondary | ICD-10-CM | POA: Diagnosis not present

## 2022-08-22 DIAGNOSIS — R2689 Other abnormalities of gait and mobility: Secondary | ICD-10-CM | POA: Diagnosis not present

## 2022-08-22 DIAGNOSIS — N183 Chronic kidney disease, stage 3 unspecified: Secondary | ICD-10-CM | POA: Diagnosis not present

## 2022-08-22 DIAGNOSIS — I509 Heart failure, unspecified: Secondary | ICD-10-CM | POA: Diagnosis not present

## 2022-08-22 DIAGNOSIS — E559 Vitamin D deficiency, unspecified: Secondary | ICD-10-CM | POA: Diagnosis not present

## 2022-08-22 DIAGNOSIS — E875 Hyperkalemia: Secondary | ICD-10-CM | POA: Diagnosis not present

## 2022-08-22 DIAGNOSIS — I5022 Chronic systolic (congestive) heart failure: Secondary | ICD-10-CM | POA: Diagnosis not present

## 2022-08-22 DIAGNOSIS — K219 Gastro-esophageal reflux disease without esophagitis: Secondary | ICD-10-CM | POA: Diagnosis not present

## 2022-08-22 DIAGNOSIS — I5040 Unspecified combined systolic (congestive) and diastolic (congestive) heart failure: Secondary | ICD-10-CM | POA: Diagnosis not present

## 2022-08-22 DIAGNOSIS — R1311 Dysphagia, oral phase: Secondary | ICD-10-CM | POA: Diagnosis not present

## 2022-08-22 DIAGNOSIS — I5021 Acute systolic (congestive) heart failure: Secondary | ICD-10-CM | POA: Diagnosis not present

## 2022-08-22 DIAGNOSIS — R609 Edema, unspecified: Secondary | ICD-10-CM | POA: Diagnosis not present

## 2022-08-22 DIAGNOSIS — I130 Hypertensive heart and chronic kidney disease with heart failure and stage 1 through stage 4 chronic kidney disease, or unspecified chronic kidney disease: Secondary | ICD-10-CM | POA: Diagnosis not present

## 2022-08-22 DIAGNOSIS — N1832 Chronic kidney disease, stage 3b: Secondary | ICD-10-CM | POA: Diagnosis not present

## 2022-08-22 DIAGNOSIS — Z79899 Other long term (current) drug therapy: Secondary | ICD-10-CM | POA: Diagnosis not present

## 2022-08-22 DIAGNOSIS — R0600 Dyspnea, unspecified: Secondary | ICD-10-CM | POA: Diagnosis not present

## 2022-08-22 DIAGNOSIS — R400 Somnolence: Secondary | ICD-10-CM | POA: Diagnosis not present

## 2022-08-22 DIAGNOSIS — M6281 Muscle weakness (generalized): Secondary | ICD-10-CM | POA: Diagnosis not present

## 2022-08-22 DIAGNOSIS — R279 Unspecified lack of coordination: Secondary | ICD-10-CM | POA: Diagnosis not present

## 2022-08-22 LAB — BASIC METABOLIC PANEL
Anion gap: 9 (ref 5–15)
BUN: 46 mg/dL — ABNORMAL HIGH (ref 6–20)
CO2: 26 mmol/L (ref 22–32)
Calcium: 9.1 mg/dL (ref 8.9–10.3)
Chloride: 103 mmol/L (ref 98–111)
Creatinine, Ser: 1.66 mg/dL — ABNORMAL HIGH (ref 0.44–1.00)
GFR, Estimated: 36 mL/min — ABNORMAL LOW (ref >60)
Glucose, Bld: 275 mg/dL — ABNORMAL HIGH (ref 70–99)
Potassium: 4.9 mmol/L (ref 3.5–5.1)
Sodium: 138 mmol/L (ref 135–145)

## 2022-08-22 LAB — GLUCOSE, CAPILLARY
Glucose-Capillary: 245 mg/dL — ABNORMAL HIGH (ref 70–99)
Glucose-Capillary: 177 mg/dL — ABNORMAL HIGH (ref 70–99)

## 2022-08-22 MED ORDER — INSULIN ASPART 100 UNIT/ML IJ SOLN
8.0000 [IU] | Freq: Three times a day (TID) | INTRAMUSCULAR | Status: DC
Start: 2022-08-22 — End: 2022-08-22
  Administered 2022-08-22 (×2): 8 [IU] via SUBCUTANEOUS

## 2022-08-22 MED ORDER — ALPRAZOLAM 2 MG PO TABS
1.0000 mg | ORAL_TABLET | Freq: Two times a day (BID) | ORAL | 0 refills | Status: AC
Start: 2022-08-22 — End: ?

## 2022-08-22 MED ORDER — CLONIDINE HCL 0.1 MG PO TABS: 0.1000 mg | ORAL_TABLET | Freq: Every day | ORAL | 11 refills | Status: AC

## 2022-08-22 MED ORDER — INSULIN DETEMIR 100 UNIT/ML ~~LOC~~ SOLN
50.0000 [IU] | Freq: Every day | SUBCUTANEOUS | Status: DC
  Administered 2022-08-22: 50 [IU] via SUBCUTANEOUS
  Filled 2022-08-22: qty 0.5

## 2022-08-22 MED ORDER — DILTIAZEM HCL 60 MG PO TABS: 60.0000 mg | ORAL_TABLET | Freq: Two times a day (BID) | ORAL | 0 refills | Status: AC

## 2022-08-22 MED ORDER — TORSEMIDE 20 MG PO TABS
20.0000 mg | ORAL_TABLET | Freq: Once | ORAL | Status: AC
  Administered 2022-08-22: 20 mg via ORAL
  Filled 2022-08-22: qty 1

## 2022-08-22 MED ORDER — INSULIN DETEMIR 100 UNIT/ML FLEXPEN: 40.0000 [IU] | Freq: Every day | SUBCUTANEOUS | Status: AC

## 2022-08-22 MED ORDER — TORSEMIDE 20 MG PO TABS: 20.0000 mg | ORAL_TABLET | Freq: Every day | ORAL | 0 refills | Status: AC

## 2022-08-22 MED ORDER — HYDROCODONE-ACETAMINOPHEN 10-325 MG PO TABS: 0.5000 | ORAL_TABLET | Freq: Three times a day (TID) | ORAL | 0 refills | Status: AC | PRN

## 2022-08-22 MED ORDER — METOPROLOL TARTRATE 50 MG PO TABS: 50.0000 mg | ORAL_TABLET | Freq: Three times a day (TID) | ORAL | 0 refills | Status: DC

## 2022-08-22 NOTE — Discharge Summary (Signed)
Physician Discharge Summary  Erin Jackson JQD:[id number] DOB: 1966-01-31 DOA: 08/13/2022  PCP: [facility]  Admit date: 08/13/2022 Discharge date: 08/22/2022  Time spent: 35 minutes  Recommendations for Outpatient Follow-up:  Cardiology Dr.Kadakia in 2 weeks BMP in 1 week SNF for rehab, titrate insulin regimen   Discharge Diagnoses:  Principal Problem:   Acute on chronic combined systolic and diastolic CHF   Acute renal failure superimposed on stage 3b chronic kidney disease (HCC)   CAD (coronary artery disease)   GERD (gastroesophageal reflux disease)   History of CVA (cerebrovascular accident)   Primary hypertension   Type 2 diabetes mellitus with hyperlipidemia ([place])   Vision loss of left eye   Anxiety   Vitamin D deficiency   Class 2 obesity   Discharge Condition: Stable  Diet recommendation: Low sodium, diabetic  Filed Weights   08/19/22 0516 08/21/22 0545 08/22/22 0329  Weight: 90.4 kg 92.1 kg 94.3 kg    History of present illness:  56/F with history of chronic systolic EF 03-75% and diastolic CHF, moderate nonobstructive CAD, history of CVA, CAD, CKD 3b, hypertension, type 2 diabetes mellitus presented to the ED with fluid overload, dyspnea on exertion.  Improved with diuresis, clonidine was discontinued, developed tachycardia on hydralazine and with discontinuation of clonidine -complicated by AKI  Hospital Course:   Acute on chronic systolic and diastolic CHF Sinus tachycardia -Echo with EF 40-45%, global hypokinesis, preserved RV function -Diuresed well with IV Lasix she is 14.8 L negative -Was taken off clonidine, developed tachycardia -Followed by cardiology, low-dose clonidine resumed and beta-blocker dose increased, started on diltiazem as well by Dr.Kadakia, hopefully can be weaned off down down the road considering her cardiomyopathy -GDMT limited by AKI/CKD -Discontinued Imdur, resumed torsemide at discharge -Consider adding Jardiance,  Aldactone at follow-up if kidney function continues to improve -Follow-up with Dr. Doylene Canard in 1 to 2 weeks   AKI on CKD 3b Hypocalcemia and hypomagnesemia.  Baseline creatinine around 1.4, peaked at 2.1, now 1.6 -Likely multifactorial, cardiorenal component with tachycardia and diuresis -Held diuretics briefly, resumed at discharge   CAD (coronary artery disease) -Stable, Continue with clopidogrel and statin therapy.    GERD (gastroesophageal reflux disease) Continue with pantoprazole.    History of CVA (cerebrovascular accident) -Continue Plavix and statin   Type 2 diabetes mellitus with hyperlipidemia (HCC) Uncontrolled hyperglycemia.  -Continue Levemir and meal coverage insulin, titrate dose further   Vision loss of left eye, chronic Patient with chronic left eye vision loss.  Previously empirically started on prednisone for suspected temporal arthritis.  However had biopsy on 6/13, temporal arteritis was ruled out, has since been on prednisone taper -Was down to 10 Mg of prednisone recently, now on 5 Mg, weaned off steroids altogether -Follow-up with ophthalmology   Anxiety Continue alprazolam 1 mg twice daily per home regimen  Chronic back pain -On hydrocodone chronically, this was resumed   Vitamin D deficiency Continue supplementation   Class 2 obesity Calculated BMI 39.0   Consultations: Cardiology Dr. Doylene Canard  Discharge Exam: Vitals:   08/22/22 0329 08/22/22 0804  BP: (!) 128/97 (!) 154/113  Pulse: 89   Resp: 16 20  Temp: 98.2 F (36.8 C)   SpO2: 91% 100%   General exam: Obese pleasant female sitting up in bed, AAOx3, no distress Neck obese unable to assess JVD CVS: S1-S2, regular rhythm, tachycardic Lungs: Clear abdomen: Soft, nontender, bowel sounds present Extremities: trace edema Skin: No rashes Psychiatry:  Mood & affect appropriate.   Discharge  Instructions   Discharge Instructions     Diet - low sodium heart healthy   Complete by: As  directed    Diet Carb Modified   Complete by: As directed    Increase activity slowly   Complete by: As directed       Allergies as of 08/22/2022       Reactions   Cucumber Extract Anaphylaxis   Molds & Smuts Shortness Of Breath   Norvasc [amlodipine] Other (See Comments)   Muscle became very tight   Other Anaphylaxis, Other (See Comments)   Tree nuts, walnuts,  pickles   Peanut-containing Drug Products Anaphylaxis   Apple Juice Hives   Aspirin Rash   Ibuprofen Rash   Niacin And Related Rash        Medication List     STOP taking these medications    furosemide 40 MG tablet Commonly known as: LASIX   hydrALAZINE 25 MG tablet Commonly known as: APRESOLINE   hydrochlorothiazide 50 MG tablet Commonly known as: HYDRODIURIL   losartan 100 MG tablet Commonly known as: COZAAR   metFORMIN 500 MG tablet Commonly known as: GLUCOPHAGE   metoprolol succinate 25 MG 24 hr tablet Commonly known as: Toprol XL   predniSONE 20 MG tablet Commonly known as: DELTASONE   predniSONE 5 MG tablet Commonly known as: DELTASONE   sucralfate 1 g tablet Commonly known as: CARAFATE       TAKE these medications    acetaminophen 325 MG tablet Commonly known as: TYLENOL Take 2 tablets (650 mg total) by mouth every 4 (four) hours as needed for fever or mild pain.   alprazolam 2 MG tablet Commonly known as: XANAX Take 0.5 tablets (1 mg total) by mouth 2 (two) times daily.   atorvastatin 80 MG tablet Commonly known as: LIPITOR Take 1 tablet (80 mg total) by mouth daily. What changed: when to take this   calcium carbonate 1250 (500 Ca) MG tablet Commonly known as: OS-CAL - dosed in mg of elemental calcium Take 1 tablet (1,250 mg total) by mouth 2 (two) times daily with a meal.   cetirizine 10 MG tablet Commonly known as: ZyrTEC Allergy Take 1 tablet (10 mg total) by mouth daily. What changed: when to take this   cloNIDine 0.1 MG tablet Commonly known as: CATAPRES Take  1 tablet (0.1 mg total) by mouth daily. What changed:  how much to take when to take this   clopidogrel 75 MG tablet Commonly known as: PLAVIX Take 1 tablet (75 mg total) by mouth daily. What changed: when to take this   diltiazem 60 MG tablet Commonly known as: CARDIZEM Take 1 tablet (60 mg total) by mouth 2 (two) times daily.   EPINEPHrine 0.3 mg/0.3 mL Soaj injection Commonly known as: EPI-PEN Inject 0.3 mLs (0.3 mg total) into the muscle once as needed (allergic reaction). What changed: reasons to take this   ferrous gluconate 324 MG tablet Commonly known as: FERGON Take 324 mg by mouth every other day.   HYDROcodone-acetaminophen 10-325 MG tablet Commonly known as: NORCO Take 0.5 tablets by mouth every 8 (eight) hours as needed for moderate pain (lower back pain).   insulin detemir 100 unit/ml Soln Commonly known as: LEVEMIR Inject 40 Units into the skin at bedtime. What changed: how much to take   insulin lispro 100 UNIT/ML injection Commonly known as: HUMALOG Inject 2-12 Units into the skin 2 (two) times daily. Per sliding scale, if 70 to 200= 0 units, 201 to  250= 2 units, 251 to 300= 4 units 301 to 350= 6 units, 351 to 400= 8 units, 401 to 450= 10 units, 451 to 600= 12 units   metoprolol tartrate 50 MG tablet Commonly known as: LOPRESSOR Take 1 tablet (50 mg total) by mouth 3 (three) times daily.   multivitamin with minerals Tabs tablet Take 1 tablet by mouth every morning.   Narcan 4 MG/0.1ML Liqd nasal spray kit Generic drug: naloxone Place 0.4 mg into the nose daily as needed (drug overdose).   nitroGLYCERIN 0.4 MG SL tablet Commonly known as: NITROSTAT Place 1 tablet (0.4 mg total) under the tongue every 5 (five) minutes as needed for chest pain. What changed: reasons to take this   ondansetron 4 MG tablet Commonly known as: ZOFRAN Take 1 tablet (4 mg total) by mouth every 8 (eight) hours as needed for nausea or vomiting.   OneTouch Delica Plus  [id number] Misc Apply topically daily.   OneTouch Verio test strip Generic drug: glucose blood daily.   pantoprazole 40 MG tablet Commonly known as: PROTONIX Take 1 tablet (40 mg total) by mouth 2 (two) times daily before a meal.   polyethylene glycol 17 g packet Commonly known as: MIRALAX / GLYCOLAX Take 17 g by mouth every morning.   PRESCRIPTION MEDICATION 1 Dose See admin instructions. Order date 08/12/22: give CMP and magnesium one time only for 3 days   senna 8.6 MG Tabs tablet Commonly known as: SENOKOT Take 2 tablets by mouth daily as needed (constipation).   tiZANidine 2 MG tablet Commonly known as: ZANAFLEX Take 2 mg by mouth every 6 (six) hours as needed for muscle spasms.   torsemide 20 MG tablet Commonly known as: DEMADEX Take 1 tablet (20 mg total) by mouth daily.   traZODone 150 MG tablet Commonly known as: DESYREL Take 1 tablet (150 mg total) by mouth at bedtime.   Vitamin D (Ergocalciferol) 1.25 MG (50000 UNIT) Caps capsule Commonly known as: DRISDOL Take 1 capsule (50,000 Units total) by mouth every 7 (seven) days. What changed: when to take this   vitamin E 1000 UNIT capsule Take 1,000 Units by mouth daily at 12 noon.       Allergies  Allergen Reactions   Cucumber Extract Anaphylaxis   Molds & Smuts Shortness Of Breath   Norvasc [Amlodipine] Other (See Comments)    Muscle became very tight   Other Anaphylaxis and Other (See Comments)    Tree nuts, walnuts,  pickles   Peanut-Containing Drug Products Anaphylaxis   Apple Juice Hives   Aspirin Rash   Ibuprofen Rash   Niacin And Related Rash    Follow-up Information     Dixie Dials, MD Follow up in 2 week(s).   Specialty: Cardiology Why: Bring all medications Contact information: [place] [address] [phone]                  The results of significant diagnostics from this hospitalization (including imaging, microbiology, ancillary and laboratory)  are listed below for reference.    Significant Diagnostic Studies: DG Chest 2 View  Result Date: 08/13/2022 CLINICAL DATA:  Shortness of breath EXAM: CHEST - 2 VIEW COMPARISON:  Chest CT dated 06/15/2022. FINDINGS: The heart is enlarged. Mild bilateral lower lung predominant interstitial opacities are noted. There is no pleural effusion or pneumothorax. Degenerative changes are seen in the spine. IMPRESSION: Mild bilateral lower lung predominant interstitial opacities may reflect mild pulmonary edema. Electronically Signed   By:  Zerita Boers M.D.   On: 08/13/2022 19:51   VAS [place] LOWER EXTREMITY VENOUS (DVT)  Result Date: 07/23/2022  Lower Venous DVT Study Patient Name:  Erin Jackson  Date of Exam:   07/23/2022 Medical Rec #: [id number]       Accession #:    [id number] Date of Birth: 03-27-1966       Patient Gender: F Patient Age:   68 years Exam Location:  [facility] Procedure:      VAS [place] LOWER EXTREMITY VENOUS (DVT) Referring Phys: Vance Gather --------------------------------------------------------------------------------  Indications: Edema.  Comparison Study: 04/09/22, negative for DVT. Performing Technologist: Bobetta Lime BS, RVT  Examination Guidelines: A complete evaluation includes B-mode imaging, spectral Doppler, color Doppler, and power Doppler as needed of all accessible portions of each vessel. Bilateral testing is considered an integral part of a complete examination. Limited examinations for reoccurring indications may be performed as noted. The reflux portion of the exam is performed with the patient in reverse Trendelenburg.  +---------+---------------+---------+-----------+----------+--------------+ RIGHT    CompressibilityPhasicitySpontaneityPropertiesThrombus Aging +---------+---------------+---------+-----------+----------+--------------+ CFV      Full           Yes      Yes                                  +---------+---------------+---------+-----------+----------+--------------+ SFJ      Full                                                        +---------+---------------+---------+-----------+----------+--------------+ FV Prox  Full                                                        +---------+---------------+---------+-----------+----------+--------------+ FV Mid   Full                                                        +---------+---------------+---------+-----------+----------+--------------+ FV DistalFull                                                        +---------+---------------+---------+-----------+----------+--------------+ PFV      Full                                                        +---------+---------------+---------+-----------+----------+--------------+ POP      Full           Yes      Yes                                 +---------+---------------+---------+-----------+----------+--------------+  PTV      Full                                                        +---------+---------------+---------+-----------+----------+--------------+ PERO     Full                                                        +---------+---------------+---------+-----------+----------+--------------+   +---------+---------------+---------+-----------+----------+--------------+ LEFT     CompressibilityPhasicitySpontaneityPropertiesThrombus Aging +---------+---------------+---------+-----------+----------+--------------+ CFV      Full           Yes      Yes                                 +---------+---------------+---------+-----------+----------+--------------+ SFJ      Full                                                        +---------+---------------+---------+-----------+----------+--------------+ FV Prox  Full                                                         +---------+---------------+---------+-----------+----------+--------------+ FV Mid   Full                                                        +---------+---------------+---------+-----------+----------+--------------+ FV DistalFull                                                        +---------+---------------+---------+-----------+----------+--------------+ PFV      Full                                                        +---------+---------------+---------+-----------+----------+--------------+ POP      Full           Yes      Yes                                 +---------+---------------+---------+-----------+----------+--------------+ PTV      Full                                                        +---------+---------------+---------+-----------+----------+--------------+  PERO     Full                                                        +---------+---------------+---------+-----------+----------+--------------+     Summary: BILATERAL: - No evidence of deep vein thrombosis seen in the lower extremities, bilaterally. -No evidence of popliteal cyst, bilaterally.   *See table(s) above for measurements and observations. Electronically signed by Jamelle Haring on 07/23/2022 at 4:36:22 PM.    Final     Microbiology: No results found for this or any previous visit (from the past 240 hour(s)).   Labs: Basic Metabolic Panel: Recent Labs  Lab 08/17/22 0621 08/18/22 0348 08/19/22 0630 08/20/22 0326 08/21/22 0441 08/22/22 0309  NA 138 135 135 135 137 138  K 4.4 4.4 5.0 4.8 4.5 4.9  CL 94* 94* 89* 96* 100 103  CO2 $Re'31 29 29 30 28 26  'FFT$ GLUCOSE 232* 255* 260* 236* 218* 275*  BUN 35* 39* 45* 51* 51* 46*  CREATININE 1.49* 1.62* 1.98* 2.19* 1.81* 1.66*  CALCIUM 9.6 9.6 10.4* 9.3 9.1 9.1  MG 1.3* 1.9 1.7  --   --   --    Liver Function Tests: No results for input(s): "AST", "ALT", "ALKPHOS", "BILITOT", "PROT", "ALBUMIN" in the last 168 hours. No  results for input(s): "LIPASE", "AMYLASE" in the last 168 hours. No results for input(s): "AMMONIA" in the last 168 hours. CBC: Recent Labs  Lab 08/19/22 0630 08/20/22 0326 08/21/22 0441  WBC 9.8 9.0 8.4  NEUTROABS 4.0  --   --   HGB 12.8 11.2* 9.8*  HCT 39.2 36.0 32.2*  MCV 87.9 89.8 91.7  PLT 249 228 182   Cardiac Enzymes: No results for input(s): "CKTOTAL", "CKMB", "CKMBINDEX", "TROPONINI" in the last 168 hours. BNP: BNP (last 3 results) Recent Labs    04/20/22 1207 06/14/22 2347 08/13/22 1911  BNP 243.7* 63.1 63.8    ProBNP (last 3 results) No results for input(s): "PROBNP" in the last 8760 hours.  CBG: Recent Labs  Lab 08/20/22 2142 08/21/22 0554 08/21/22 1148 08/21/22 1601 08/22/22 0556  GLUCAP 335* 203* 263* 268* 245*       Signed:  Domenic Polite MD.  [facility] 08/22/2022, 9:00 AM

## 2022-08-22 NOTE — Progress Notes (Signed)
Called facility and gave report to the facility nurse. All questions answered.

## 2022-08-22 NOTE — TOC Transition Note (Signed)
Transition of Care [facility]) - CM/SW Discharge Note   Patient Details  Name: Erin Jackson MRN: [id number] Date of Birth: [birth year]  [facility] CM/SW Contact:  Ralene Bathe, LCSWA Phone Number: 08/22/2022, 11:37 AM   Clinical Narrative:    Patient will DC to: [facility]  Anticipated DC date:  08/22/2022  Transport by:  Safe Transport   Per MD patient ready for DC to SNF. RN to call report prior to discharge [phone] room 106. RN, patient, and facility notified of DC. Discharge Summary and FL2 sent to facility. Transportation will be requested for patient when floor RN reports patient is ready.   CSW will sign off for now as social work intervention is no longer needed. Please consult [place] again if new needs arise.     Final next level of care: Skilled Nursing Facility Barriers to Discharge: Barriers Resolved   Patient Goals and CMS Choice Patient states their goals for this hospitalization and ongoing recovery are:: Pt states she wants to return [facility]. CMS Medicare.gov Compare Post Acute Care list provided to:: Patient Choice offered to / list presented to : Patient  Discharge Placement              Patient chooses bed at:  [facility]) Patient to be transferred to facility by: [facility] Transport Name of family member notified: Self Patient and family notified of of transfer: 08/22/22  Discharge Plan and Services     Post Acute Care Choice: Skilled Nursing Facility                               Social Determinants of Health (SDOH) Interventions Food Insecurity Interventions: Intervention Not Indicated Housing Interventions: Intervention Not Indicated Transportation Interventions: Intervention Not Indicated Financial Strain Interventions: Intervention Not Indicated   Readmission Risk Interventions     No data to display

## 2022-08-22 NOTE — Progress Notes (Signed)
Patient discharged: to facility with safe transport  Via: Wheelchair   Discharge paperwork given: to patient   Reviewed with teach back  IV and telemetry disconnected  Belongings given to patient

## 2022-08-22 NOTE — Progress Notes (Signed)
Ready for d/c

## 2022-08-22 NOTE — Progress Notes (Signed)
Subjective:  Patient up in chair denies any chest pain or shortness of breath.  Denies any palpitations.  Eager to be discharged blood pressure remains elevated.  No arrhythmias noted.  Renal function improved still some component of prerenal azotemia.  Agree with holding diuretics for now.  Objective:  Vital Signs in the last 24 hours: Temp:  [98.2 F (36.8 C)-98.5 F (36.9 C)] 98.2 F (36.8 C) (09/02 0329) Pulse Rate:  [89-106] 89 (09/02 0329) Resp:  [16-23] 20 (09/02 0804) BP: (119-154)/(57-113) 154/113 (09/02 0804) SpO2:  [91 %-100 %] 100 % (09/02 0804) Weight:  [94.3 kg] 94.3 kg (09/02 0329)  Intake/Output from previous day: 09/01 0701 - 09/02 0700 In: 1090 [P.O.:1080; I.V.:10] Out: 600 [Urine:600] Intake/Output from this shift: Total I/O In: 250 [P.O.:250] Out: 250 [Urine:250]  Physical Exam: Neck: no adenopathy, no carotid bruit, no JVD, and supple, symmetrical, trachea midline Lungs: clear to auscultation bilaterally Heart: regular rate and rhythm, S1, S2 normal, and 2/6 systolic murmur noted no S3 gallop Abdomen: soft, non-tender; bowel sounds normal; no masses,  no organomegaly Extremities: No clubbing cyanosis trace edema noted  Lab Results: Recent Labs    08/20/22 0326 08/21/22 0441  WBC 9.0 8.4  HGB 11.2* 9.8*  PLT 228 182   Recent Labs    08/21/22 0441 08/22/22 0309  NA 137 138  K 4.5 4.9  CL 100 103  CO2 28 26  GLUCOSE 218* 275*  BUN 51* 46*  CREATININE 1.81* 1.66*   No results for input(s): "TROPONINI" in the last 72 hours.  Invalid input(s): "CK", "MB" Hepatic Function Panel No results for input(s): "PROT", "ALBUMIN", "AST", "ALT", "ALKPHOS", "BILITOT", "BILIDIR", "IBILI" in the last 72 hours. No results for input(s): "CHOL" in the last 72 hours. No results for input(s): "PROTIME" in the last 72 hours.  Imaging: Imaging results have been reviewed  Cardiac Studies:  Assessment/Plan:  Compensated systolic/diastolic congestive heart  failure Nonobstructive CAD Uncontrolled hypertension COPD Morbid obesity History of CVA in the past Chronic kidney disease stage IIIb improved Type 2 diabetes mellitus Hyperlipidemia Plan Start low-dose Entresto 24/26 mg twice daily and will uptitrate as outpatient if renal function remains stable Consider switching metoprolol tartrate to metoprolol succinate and hold Cardizem in view of depressed LV systolic function Okay to discharge from cardiac point of view follow-up with Dr. Algie Coffer in 1 to 2 weeks   LOS: 8 days    Rinaldo Cloud 08/22/2022, 10:38 AM

## 2022-08-24 ENCOUNTER — Other Ambulatory Visit (HOSPITAL_COMMUNITY): Payer: Self-pay

## 2022-08-25 ENCOUNTER — Ambulatory Visit: Payer: Medicare Other | Admitting: Physical Medicine & Rehabilitation

## 2022-08-25 DIAGNOSIS — E785 Hyperlipidemia, unspecified: Secondary | ICD-10-CM | POA: Diagnosis not present

## 2022-08-25 DIAGNOSIS — R609 Edema, unspecified: Secondary | ICD-10-CM | POA: Diagnosis not present

## 2022-08-25 DIAGNOSIS — E119 Type 2 diabetes mellitus without complications: Secondary | ICD-10-CM | POA: Diagnosis not present

## 2022-08-25 DIAGNOSIS — I10 Essential (primary) hypertension: Secondary | ICD-10-CM | POA: Diagnosis not present

## 2022-08-25 DIAGNOSIS — I509 Heart failure, unspecified: Secondary | ICD-10-CM | POA: Diagnosis not present

## 2022-08-25 DIAGNOSIS — E8770 Fluid overload, unspecified: Secondary | ICD-10-CM | POA: Diagnosis not present

## 2022-08-26 ENCOUNTER — Telehealth (HOSPITAL_COMMUNITY): Payer: Self-pay | Admitting: *Deleted

## 2022-08-26 ENCOUNTER — Encounter (HOSPITAL_COMMUNITY): Payer: Medicare Other

## 2022-08-26 DIAGNOSIS — R609 Edema, unspecified: Secondary | ICD-10-CM | POA: Diagnosis not present

## 2022-08-26 NOTE — Telephone Encounter (Signed)
Heart Failure Nurse Navigator Progress Note   Called to confirm appointment for 08/26/22, patient didn't realize she had a appointment and needed to reschedule. New appointment is 09/02/22 @ 9 am.     Rhae Hammock, BSN, RN Heart Failure Teacher, adult education Only

## 2022-08-27 DIAGNOSIS — E559 Vitamin D deficiency, unspecified: Secondary | ICD-10-CM | POA: Diagnosis not present

## 2022-08-27 DIAGNOSIS — E0801 Diabetes mellitus due to underlying condition with hyperosmolarity with coma: Secondary | ICD-10-CM | POA: Diagnosis not present

## 2022-08-28 DIAGNOSIS — I509 Heart failure, unspecified: Secondary | ICD-10-CM | POA: Diagnosis not present

## 2022-08-28 DIAGNOSIS — I517 Cardiomegaly: Secondary | ICD-10-CM | POA: Diagnosis not present

## 2022-08-28 DIAGNOSIS — M79605 Pain in left leg: Secondary | ICD-10-CM | POA: Diagnosis not present

## 2022-08-28 DIAGNOSIS — R609 Edema, unspecified: Secondary | ICD-10-CM | POA: Diagnosis not present

## 2022-08-28 DIAGNOSIS — M79604 Pain in right leg: Secondary | ICD-10-CM | POA: Diagnosis not present

## 2022-08-31 DIAGNOSIS — R400 Somnolence: Secondary | ICD-10-CM | POA: Diagnosis not present

## 2022-08-31 DIAGNOSIS — R609 Edema, unspecified: Secondary | ICD-10-CM | POA: Diagnosis not present

## 2022-08-31 DIAGNOSIS — I509 Heart failure, unspecified: Secondary | ICD-10-CM | POA: Diagnosis not present

## 2022-09-01 DIAGNOSIS — E0801 Diabetes mellitus due to underlying condition with hyperosmolarity with coma: Secondary | ICD-10-CM | POA: Diagnosis not present

## 2022-09-01 DIAGNOSIS — I10 Essential (primary) hypertension: Secondary | ICD-10-CM | POA: Diagnosis not present

## 2022-09-01 DIAGNOSIS — E875 Hyperkalemia: Secondary | ICD-10-CM | POA: Diagnosis not present

## 2022-09-01 NOTE — Progress Notes (Signed)
[facility] V[facility] Transitions [facility] Heart Failure Pharmacist Encounter  PCP: [facility] PCP-Cardiologist: None  HPI: Erin Jackson is a 56 yo F with PMH significant for CHF, T2DM, stage 3b CKD, PVD, CAD, HTN, and CVA. She was originally hospitalized in 03/2022 with a new diagnosis of CHF. She presented with with severe chest pain, underwent cardia ccath which was significant for multivessel nonobstructive CAD. Her LVEF at the time was 35-40% and she was discharged on losartan, lasix, and metoprolol tartrate.   She was admitted 04/2022 for CVA. ECHO bubble during this admission showed LVEF of 40-45% and grade II diastolic dysfunction. She presented in 05/2022 and 06/2022 to the ED with symptoms of stroke, r/o during both admissions.   During her most recent admission on 08/13/2022, she presented with worsening SOB, bilateral LEE, and tachycardia. She endorsed medication compliance with home Lasix, but could not confirm her dose. Metoprolol dose increased and diltiazem for rate control, but continued to experience sinus tachycardia. AKI on CKD during admission led ARB and SGLT2i discontinuation.    Today, Erin Jackson presents to the [facility] for follow up. She endorses LEE and SOB. ReDs score of 38 today.  Currently in a nursing facility. She presented with a medication list from from the nursing facility with a date of August 07, 2022, which was prior to her hospitalization. She has seen her cardiologist since discharge    HF Medications:metoprolol tartrate 73m TID, torsemide 2101monce daily, diltiazem 6041mID   Has the patient been experiencing any side effects to the medications prescribed?  no Does the patient have any problems obtaining medications due to transportation or finances?   no Understanding of regimen: good Understanding of indications: good Potential of compliance: good Patient understands to avoid NSAIDs. Patient understands to  avoid decongestants.   Pertinent Lab Values: Serum creatinine 2.70, BUN 32, Potassium 4.6, Sodium 132, BNP 63.8 (8/24), Magnesium 1.7 (8/30)  Vital Signs: Weight: 207 lbs (discharge weight: 208 lbs) Blood pressure: 98/60  Heart rate: 98   Medication Assistance / Insurance Benefits Check: Does the patient have prescription insurance?  Yes Type of insurance plan: UHC Medicare + [place] Medicaid   Outpatient Pharmacy:  Current outpatient pharmacy: [facility]  [facility]   Assessment: 1) Chronic systolic CHF (EF 40-93-23% 04/2022), likely due to ischemic causes. NYHA class III symptoms. - Diuretic therapy appropriate to continue - Currently not on ideal beta blocker therapy with IR metoprolol. Would benefit from GDMT ER metoprolol formulation.  - Given eGFR of 20, could consider Jardiance initiation if renal function improves. - MRA therapy not appropriate based on renal function. - Appropriate to continue current RAAS therapy at this time with losartan given borderline hyperkalemia at discharge and AKI during inpatient stay. Could consider Entresto at a future visit if renal function becomes normotensive to hypertensive and renal function continues to improve. -Given her sinus tachycardia during her inpatient stay and current maximum beta blocker therapy dose, could benefit from Corlanor initiation.  -Diltiazem not ideal therapy for sinus tachycardia management with her heart failure diagnosis.  -Appropriate to consider discontinuation of her clonidine at this time as she her dose had been slowly titrated down during his hospital stay. Not ideal therapy for hypertension management.   -Difficult to make any adjustments to her current medications as we cannot verify a current medication list from her nursing facility and she has  since seen cardiology outside of our health system.   Plan: 1) Medication changes: -none at this time    2) Patient Assistance: - Co-pays: Entresto $0 and Jardiance $0  3) Follow up: - follow up with Dr. Doylene Canard at the cardiology clinic  Maryan Puls, PharmD PGY-1 [facility] Pharm[facility] Resident

## 2022-09-02 ENCOUNTER — Telehealth (HOSPITAL_COMMUNITY): Payer: Self-pay

## 2022-09-02 ENCOUNTER — Telehealth (HOSPITAL_COMMUNITY): Payer: Self-pay | Admitting: *Deleted

## 2022-09-02 ENCOUNTER — Ambulatory Visit (HOSPITAL_COMMUNITY)
Admission: RE | Admit: 2022-09-02 | Discharge: 2022-09-02 | Disposition: A | Discharge status: Home / Self Care | Payer: Medicare Other | Source: Ambulatory Visit | Attending: Cardiology | Admitting: Cardiology

## 2022-09-02 ENCOUNTER — Encounter (HOSPITAL_COMMUNITY): Payer: Self-pay

## 2022-09-02 VITALS — BP 98/60 | HR 98 | Wt 207.2 lb

## 2022-09-02 DIAGNOSIS — E1122 Type 2 diabetes mellitus with diabetic chronic kidney disease: Secondary | ICD-10-CM | POA: Insufficient documentation

## 2022-09-02 DIAGNOSIS — Z8673 Personal history of transient ischemic attack (TIA), and cerebral infarction without residual deficits: Secondary | ICD-10-CM | POA: Insufficient documentation

## 2022-09-02 DIAGNOSIS — Z7902 Long term (current) use of antithrombotics/antiplatelets: Secondary | ICD-10-CM | POA: Diagnosis not present

## 2022-09-02 DIAGNOSIS — I130 Hypertensive heart and chronic kidney disease with heart failure and stage 1 through stage 4 chronic kidney disease, or unspecified chronic kidney disease: Secondary | ICD-10-CM | POA: Diagnosis not present

## 2022-09-02 DIAGNOSIS — Z79899 Other long term (current) drug therapy: Secondary | ICD-10-CM | POA: Diagnosis not present

## 2022-09-02 DIAGNOSIS — Z7984 Long term (current) use of oral hypoglycemic drugs: Secondary | ICD-10-CM | POA: Insufficient documentation

## 2022-09-02 DIAGNOSIS — I2510 Atherosclerotic heart disease of native coronary artery without angina pectoris: Secondary | ICD-10-CM | POA: Insufficient documentation

## 2022-09-02 DIAGNOSIS — N1832 Chronic kidney disease, stage 3b: Secondary | ICD-10-CM | POA: Diagnosis not present

## 2022-09-02 DIAGNOSIS — I5022 Chronic systolic (congestive) heart failure: Secondary | ICD-10-CM | POA: Diagnosis not present

## 2022-09-02 LAB — BASIC METABOLIC PANEL
Anion gap: 10 (ref 5–15)
BUN: 32 mg/dL — ABNORMAL HIGH (ref 6–20)
CO2: 25 mmol/L (ref 22–32)
Calcium: 9.3 mg/dL (ref 8.9–10.3)
Chloride: 97 mmol/L — ABNORMAL LOW (ref 98–111)
Creatinine, Ser: 2.7 mg/dL — ABNORMAL HIGH (ref 0.44–1.00)
GFR, Estimated: 20 mL/min — ABNORMAL LOW (ref >60)
Glucose, Bld: 230 mg/dL — ABNORMAL HIGH (ref 70–99)
Potassium: 4.6 mmol/L (ref 3.5–5.1)
Sodium: 132 mmol/L — ABNORMAL LOW (ref 135–145)

## 2022-09-02 MED ORDER — EMPAGLIFLOZIN 10 MG PO TABS
10.0000 mg | ORAL_TABLET | Freq: Every day | ORAL | 3 refills | Status: DC
Start: 2022-09-02 — End: 2022-09-02

## 2022-09-02 NOTE — Progress Notes (Signed)
ReDS Vest / Clip - 09/02/22 0900       ReDS Vest / Clip   Station Marker B    Ruler Value 34    ReDS Value Range Moderate volume overload    ReDS Actual Value 38

## 2022-09-02 NOTE — Patient Instructions (Addendum)
Medication Changes:  START Jardiance 10mg . Take one tablet daily.   Lab Work:  Labs done today, your results will be available in MyChart, we will contact you for abnormal readings.  Testing/Procedures:  None  Referrals:  None  Special Instructions // Education:  Do the following things EVERYDAY: Weigh yourself in the morning before breakfast. Write it down and keep it in a log. Take your medicines as prescribed Eat low salt foods--Limit salt (sodium) to 2000 mg per day.  Stay as active as you can everyday Limit all fluids for the day to less than 2 liters Continue working towards smoking cessation.     Follow-Up in: with cardiology office, Dr. [phone]. Please try to schedule appointment follow up for around 4 weeks.   Have facility call to schedule your appointment

## 2022-09-02 NOTE — Progress Notes (Signed)
HEART & VASCULAR TRANSITION OF CARE CONSULT NOTE     Referring Physician: Dr. Jomarie Longs  Primary Care:[facility] Primary Cardiologist: Dr. Algie Coffer    HPI: Referred to clinic by Dr. Jomarie Longs for heart failure consultation.   56 y/o AAF w/ h/o chronic systolic heart failure, NICM, CAD w/ moderate nonobstructive disease by [facility] 03/2022, HTN, T2DM, CKD IIIb and h/o CVA.   Echo 4/23 EF 35-40%, RV ok. LHC 4/23 50% ost-prox LAD, 40% mid LCx, 50% dLCx.  Admitted w/ CVA 5/23. Echo EF slightly improved 40-45%, negative bubble study. Carotid dopplers w/o obstructive dz, 1-39% b/l ICA stenosis.   Followed by Dr. Algie Coffer.   Admitted 8/23 for a/c CHF in the setting of dietary indiscretion and uncontrolled HTN. Diuresed 15L w/ IV Lasix. Transitioned to PO torsemide. GDMT limited due to renal fx (Scr peaked to 2.2 during hospitalization, down to 1.66 at d/c, GFR 36). D/c wt 207 lb. Referred to [facility] clinic.   Presents today for f/u. Complicating of LEE, SOB and fatigue. NYHA Class II-early III. Denies resting dyspnea. No orthopnea/PND. Wt stable at 207 lb in clinic today but mild LEE noted on exam. ReDs 38%. BP soft, 98/60, after getting AM meds today. Denies orthostatic symptoms. No chest pain. Says she has had post hospital f/u w/ Dr. Algie Coffer since discharge. Notes not available for my review.    Cardiac Testing   Echo 4/23 EF 35-40%, RV ok  LHC 4/23    Ost LAD to Prox LAD lesion is 50% stenosed.   Mid Cx lesion is 40% stenosed.   Dist Cx lesion is 50% stenosed.  Echo 5/23 w/ Bubble Study  - EF 40-45%, RV ok. Negative bubble study    Review of Systems: [y] = yes, [ ]  = no   General: Weight gain [ ] ; Weight loss [ ] ; Anorexia [ ] ; Fatigue [ ] ; Fever [ ] ; Chills [ ] ; Weakness [ ]   Cardiac: Chest pain/pressure [ ] ; Resting SOB [ ] ; Exertional SOB [ Y]; Orthopnea [ ] ; Pedal Edema [Y]; Palpitations [ ] ; Syncope [ ] ; Presyncope [ ] ; Paroxysmal nocturnal dyspnea[ ]   Pulmonary: Cough [  ]; Wheezing[ ] ; Hemoptysis[ ] ; Sputum [ ] ; Snoring [ ]   GI: Vomiting[ ] ; Dysphagia[ ] ; Melena[ ] ; Hematochezia [ ] ; Heartburn[ ] ; Abdominal pain [ ] ; Constipation [ ] ; Diarrhea [ ] ; BRBPR [ ]   GU: Hematuria[ ] ; Dysuria [ ] ; Nocturia[ ]   Vascular: Pain in legs with walking [ ] ; Pain in feet with lying flat [ ] ; Non-healing sores [ ] ; Stroke [ ] ; TIA [ ] ; Slurred speech [ ] ;  Neuro: Headaches[ ] ; Vertigo[ ] ; Seizures[ ] ; Paresthesias[ ] ;Blurred vision [ ] ; Diplopia [ ] ; Vision changes [ ]   Ortho/Skin: Arthritis [ ] ; Joint pain [ ] ; Muscle pain [ ] ; Joint swelling [ ] ; Back Pain [ ] ; Rash [ ]   Psych: Depression[ ] ; Anxiety[ ]   Heme: Bleeding problems [ ] ; Clotting disorders [ ] ; Anemia [ ]   Endocrine: Diabetes [Y ]; Thyroid dysfunction[ ]    Past Medical History:  Diagnosis Date   Anxiety    Chronic back pain    Diabetes mellitus without complication (HCC)    GERD (gastroesophageal reflux disease)    Hypertension     Current Outpatient Medications  Medication Sig Dispense Refill   acetaminophen (TYLENOL) 325 MG tablet Take 2 tablets (650 mg total) by mouth every 4 (four) hours as needed for fever or mild pain.     alprazolam ) 2  MG tablet Take 0.5 tablets (1 mg total) by mouth 2 (two) times daily. 10 tablet 0   atorvastatin (LIPITOR) 80 MG tablet Take 1 tablet (80 mg total) by mouth daily. 30 tablet 0   calcium carbonate (OS-CAL - DOSED IN MG OF ELEMENTAL CALCIUM) 1250 (500 Ca) MG tablet Take 1 tablet (1,250 mg total) by mouth 2 (two) times daily with a meal.     cetirizine (ZYRTEC ALLERGY) 10 MG tablet Take 1 tablet (10 mg total) by mouth daily. 30 tablet 1   cloNIDine (CATAPRES) 0.1 MG tablet Take 1 tablet (0.1 mg total) by mouth daily. 60 tablet 11   clopidogrel (PLAVIX) 75 MG tablet Take 1 tablet (75 mg total) by mouth daily. 30 tablet 0   cyclobenzaprine (FLEXERIL) 5 MG tablet Take 5 mg by mouth every 8 (eight) hours. As needed     empagliflozin (JARDIANCE) 10 MG TABS tablet Take  1 tablet (10 mg total) by mouth daily before breakfast. 30 tablet 3   EPINEPHRINE 0.3 mg/0.3 mL IJ SOAJ injection Inject 0.3 mLs (0.3 mg total) into the muscle once as needed (allergic reaction). (Patient taking differently: Inject 0.3 mg into the muscle once as needed for anaphylaxis (severe allergic reaction).) 1 Device 1   ferrous gluconate (FERGON) 324 MG tablet Take 324 mg by mouth every other day.     HYDROcodone-acetaminophen (NORCO) 10-325 MG tablet Take 0.5 tablets by mouth every 8 (eight) hours as needed for moderate pain (lower back pain). 10 tablet 0   losartan (COZAAR) 100 MG tablet Take 100 mg by mouth daily.     nitroGLYCERIN (NITROSTAT) 0.4 MG SL tablet Place 1 tablet (0.4 mg total) under the tongue every 5 (five) minutes as needed for chest pain. 25 tablet 0   ondansetron (ZOFRAN) 4 MG tablet Take 1 tablet (4 mg total) by mouth every 8 (eight) hours as needed for nausea or vomiting.     pantoprazole (PROTONIX) 40 MG tablet Take 1 tablet (40 mg total) by mouth 2 (two) times daily before a meal.     polyethylene glycol (MIRALAX / GLYCOLAX) 17 g packet Take 17 g by mouth every morning.     polyethylene glycol (MIRALAX / GLYCOLAX) 17 g packet Take 17 g by mouth daily.     senna (SENOKOT) 8.6 MG TABS tablet Take 2 tablets by mouth daily as needed (constipation).     traZODone (DESYREL) 150 MG tablet Take 1 tablet (150 mg total) by mouth at bedtime. 30 tablet 0   Vitamin D, Ergocalciferol, (DRISDOL) 1.25 MG (50000 UNIT) CAPS capsule Take 1 capsule (50,000 Units total) by mouth every 7 (seven) days. (Patient taking differently: Take 50,000 Units by mouth every Friday.) 5 capsule    vitamin E 1000 UNIT capsule Take 1,000 Units by mouth daily at 12 noon.     diltiazem (CARDIZEM) 60 MG tablet Take 1 tablet (60 mg total) by mouth 2 (two) times daily. 60 tablet 0   insulin detemir (LEVEMIR) 100 unit/ml SOLN Inject 40 Units into the skin at bedtime.     insulin lispro (HUMALOG) 100 UNIT/ML  injection Inject 2-12 Units into the skin 2 (two) times daily. Per sliding scale, if 70 to 200= 0 units, 201 to 250= 2 units, 251 to 300= 4 units 301 to 350= 6 units, 351 to 400= 8 units, 401 to 450= 10 units, 451 to 600= 12 units     Lancets (ONETOUCH DELICA PLUS 123XX123) MISC Apply topically daily.  Multiple Vitamin (MULTIVITAMIN WITH MINERALS) TABS tablet Take 1 tablet by mouth every morning.     naloxone (NARCAN) nasal spray 4 mg/0.1 mL Place 0.4 mg into the nose daily as needed (drug overdose).     ONETOUCH VERIO test strip daily.     PRESCRIPTION MEDICATION 1 Dose See admin instructions. Order date 08/12/22: give CMP and magnesium one time only for 3 days     tiZANidine (ZANAFLEX) 2 MG tablet Take 2 mg by mouth every 6 (six) hours as needed for muscle spasms.     torsemide (DEMADEX) 20 MG tablet Take 1 tablet (20 mg total) by mouth daily.  0   No current facility-administered medications for this encounter.    Allergies  Allergen Reactions   Cucumber Extract Anaphylaxis   Molds & Smuts Shortness Of Breath   Norvasc [Amlodipine] Other (See Comments)    Muscle became very tight   Other Anaphylaxis and Other (See Comments)    Tree nuts, walnuts,  pickles   Peanut-Containing Drug Products Anaphylaxis   Apple Juice Hives   Aspirin Rash   Ibuprofen Rash   Niacin And Related Rash      Social History   Socioeconomic History   Marital status: Divorced    Spouse name: Not on file   Number of children: 3   Years of education: Not on file   Highest education level: Associate degree: academic program  Occupational History   Occupation: disabled  Tobacco Use   Smoking status: Every Day    Packs/day: 0.30    Types: Cigarettes   Smokeless tobacco: Never  Vaping Use   Vaping Use: Never used  Substance and Sexual Activity   Alcohol use: Not Currently    Comment: socially   Drug use: Not Currently    Types: Marijuana    Comment: occasional   Sexual activity: Never  Other  Topics Concern   Not on file  Social History Narrative   Not on file   Social Determinants of Health   Financial Resource Strain: Low Risk  (08/19/2022)   Overall Financial Resource Strain (CARDIA)    Difficulty of Paying Living Expenses: Not very hard  Food Insecurity: No Food Insecurity (08/19/2022)   Hunger Vital Sign    Worried About Running Out of Food in the Last Year: Never true    Ran Out of Food in the Last Year: Never true  Transportation Needs: No Transportation Needs (08/19/2022)   PRAPARE - Hydrologist (Medical): No    Lack of Transportation (Non-Medical): No  Physical Activity: Not on file  Stress: Not on file  Social Connections: Moderately Integrated (08/14/2022)   Social Connection and Isolation Panel [NHANES]    Frequency of Communication with Friends and Family: More than three times a week    Frequency of Social Gatherings with Friends and Family: More than three times a week    Attends Religious Services: 1 to 4 times per year    Active Member of [employer] or Organizations: Yes    Attends Archivist Meetings: Never    Marital Status: Divorced  Human resources officer Violence: Not on file      Family History  Problem Relation Age of Onset   Breast cancer Maternal Grandmother    Breast cancer Maternal Aunt     Vitals:   09/02/22 0902  BP: 98/60  Pulse: 98  SpO2: 94%  Weight: 94 kg (207 lb 3.2 oz)    PHYSICAL EXAM: ReDs  38%  General:  Well appearing. No respiratory difficulty HEENT: normal Neck: supple. JVD 8 cm. Carotids 2+ bilat; no bruits. No lymphadenopathy or thryomegaly appreciated. Cor: PMI nondisplaced. Regular rate & rhythm. No rubs, gallops or murmurs. Lungs: clear Abdomen: soft, nontender, nondistended. No hepatosplenomegaly. No bruits or masses. Good bowel sounds. Extremities: no cyanosis, clubbing, rash, 1+ b/l LEE R> L Neuro: alert & oriented x 3, cranial nerves grossly intact. moves all 4 extremities w/o  difficulty. Affect pleasant.  ECG: NSR 98 bpm    ASSESSMENT & PLAN:  Chronic Systolic Heart Failure - Echo 5/36 EF 35-40%, RV ok - LHC 4/23 mod, nonobstructive CAD - Echo 5/23 EF 40-45% - recent admit for a/c CHF 8/23 in setting of dietary indiscretion + poorly controlled BP>>diuresed w/ IV Lasix - NYHA class II-early III. Mild fluid overload on exam. ReDs 38% - continue torsemide 20 mg daily  - add back Jardiance 10 mg daily  - No ARB/ARNi, spiro w/ CKD - it is unclear if she is still on Metoprolol tartrate vs succinate. Will obtain clinic notes from Dr. Roseanne Kaufman office  - check BMP today   2. CAD - moderate nobstructive CAD by cath 4/23 - denies CP  - on Plavix, statin + ? blocker - followed by Dr. Algie Coffer  3. HTN - controlled on current regimen - GDMT per above  4. Stage IIlB CKD - Most recent SCr down from 2>1.66. GFR 36 - add back Jardiance 10 mg daily  - check BMP today   5. Type 2DM  - per PCP  - adding back Jardiance per above   NYHA II-early III  GDMT  Diuretic- torsemide 20 mg daily  BB- Metoprolol ? Dose. Have requested records from Dr. Roseanne Kaufman office  Ace/ARB/ARNI no CKD MRA No CKD  SGLT2i Restart Jardiance 10 mg daily     Referred to HFSW (PCP, Medications, Transportation, ETOH Abuse, Drug Abuse, Insurance, Financial ): No Refer to Pharmacy: No Refer to Home Health: No Refer to Advanced Heart Failure Clinic: No  Refer to General Cardiology: Yes (Dr. Algie Coffer)   Follow up: continue f/u w/ Dr. Algie Coffer, recommend f/u in 3-4 wks

## 2022-09-02 NOTE — Telephone Encounter (Signed)
Called to confirm Heart & Vascular Transitions of Care appointment at 9 am on 09/02/2022. Patient reminded to bring all medications and pill box organizer with them. Confirmed patient has transportation. Gave directions, instructed to utilize valet parking.  Confirmed appointment prior to ending call.    Rhae Hammock, BSN, Scientist, clinical (histocompatibility and immunogenetics) Only

## 2022-09-02 NOTE — Telephone Encounter (Addendum)
-----   Message from Allayne Butcher, [place] sent at 09/02/2022  1:31 PM EDT ----- SCr elevated. Hold off on starting Jardiance. Increase Torsemide to 40 mg daily. Have SNF recheck BMP on Friday and also check BNP     Spoke with patient to update on plan. Spoke with Amy, RN at [facility] with updated plan. Both aware, agreeable, and verbalized understanding. Hold Jardiance, removed from medication list. Increase torsemide to 40mg  daily. Will have BMP + BNP drawn Friday 9/15 at facility, no script needed. Gave fax number for results. Encouraged pt to contact Dr. [phone] to get appt within 4 weeks.

## 2022-09-04 DIAGNOSIS — I10 Essential (primary) hypertension: Secondary | ICD-10-CM | POA: Diagnosis not present

## 2022-09-04 DIAGNOSIS — R0600 Dyspnea, unspecified: Secondary | ICD-10-CM | POA: Diagnosis not present

## 2022-09-07 DIAGNOSIS — I10 Essential (primary) hypertension: Secondary | ICD-10-CM | POA: Diagnosis not present

## 2022-09-07 DIAGNOSIS — I509 Heart failure, unspecified: Secondary | ICD-10-CM | POA: Diagnosis not present

## 2022-09-07 DIAGNOSIS — R609 Edema, unspecified: Secondary | ICD-10-CM | POA: Diagnosis not present

## 2022-09-07 DIAGNOSIS — E119 Type 2 diabetes mellitus without complications: Secondary | ICD-10-CM | POA: Diagnosis not present

## 2022-09-07 DIAGNOSIS — E785 Hyperlipidemia, unspecified: Secondary | ICD-10-CM | POA: Diagnosis not present

## 2022-09-14 DIAGNOSIS — Z79899 Other long term (current) drug therapy: Secondary | ICD-10-CM | POA: Diagnosis not present

## 2022-09-14 DIAGNOSIS — G8929 Other chronic pain: Secondary | ICD-10-CM | POA: Diagnosis not present

## 2022-09-14 DIAGNOSIS — M539 Dorsopathy, unspecified: Secondary | ICD-10-CM | POA: Diagnosis not present

## 2022-09-14 DIAGNOSIS — M549 Dorsalgia, unspecified: Secondary | ICD-10-CM | POA: Diagnosis not present

## 2022-09-14 DIAGNOSIS — E119 Type 2 diabetes mellitus without complications: Secondary | ICD-10-CM | POA: Diagnosis not present

## 2022-09-14 DIAGNOSIS — M79672 Pain in left foot: Secondary | ICD-10-CM | POA: Diagnosis not present

## 2022-09-15 NOTE — Progress Notes (Signed)
This encounter was created in error - please disregard.

## 2022-09-17 DIAGNOSIS — Z79899 Other long term (current) drug therapy: Secondary | ICD-10-CM | POA: Diagnosis not present

## 2022-10-01 ENCOUNTER — Ambulatory Visit: Payer: Medicare Other | Admitting: Gastroenterology

## 2022-10-02 DIAGNOSIS — R404 Transient alteration of awareness: Secondary | ICD-10-CM | POA: Diagnosis not present

## 2022-10-02 DIAGNOSIS — Z743 Need for continuous supervision: Secondary | ICD-10-CM | POA: Diagnosis not present

## 2022-10-02 DIAGNOSIS — I469 Cardiac arrest, cause unspecified: Secondary | ICD-10-CM | POA: Diagnosis not present

## 2022-10-06 DIAGNOSIS — R262 Difficulty in walking, not elsewhere classified: Secondary | ICD-10-CM | POA: Diagnosis not present

## 2022-10-21 DIAGNOSIS — 419620001 Death: Secondary | SNOMED CT | POA: Diagnosis not present

## 2022-10-21 DEATH — deceased

## 2024-03-28 IMAGING — MR MR ORBITS W/O CM
17 of 18 series · 42 of 48 positions shown · non-contrast
Comparison: None Available.

CLINICAL DATA: Monocular vision loss

EXAM:
MRI HEAD AND ORBITS WITHOUT CONTRAST
TECHNIQUE: Multiplanar, multi-echo pulse sequences of the brain and surrounding
structures were acquired without intravenous contrast. Multiplanar,
multi-echo pulse sequences of the orbits and surrounding structures
were acquired including fat saturation techniques, without
intravenous contrast administration.

[Series 5: DWI · axial · 3.0mm · 0.88mm/px · z∈[-54,+87]mm · 6 of 96 slices shown (1 of 4)]
[im 1/96]
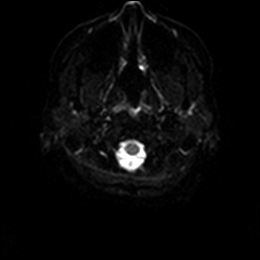
[im 20/96]
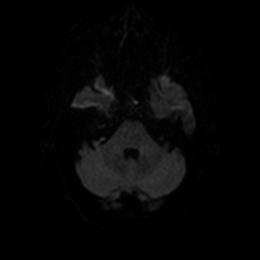
[im 39/96]
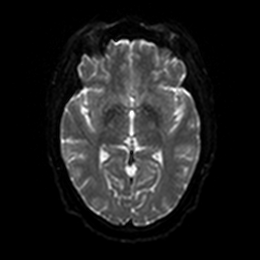
[im 58/96]
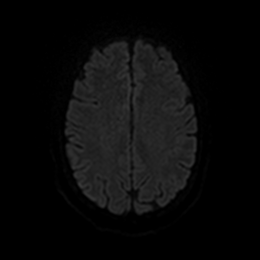
[im 77/96]
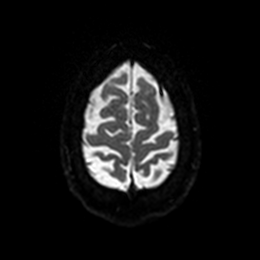
[im 96/96]
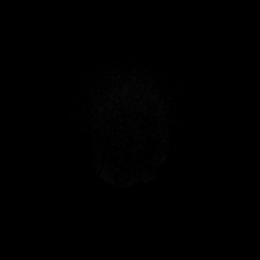

[Series 6: DWI · axial · 3.0mm · 0.88mm/px · z∈[-54,+87]mm · 2 of 48 slices shown (2 of 4)]
[im 1/48]
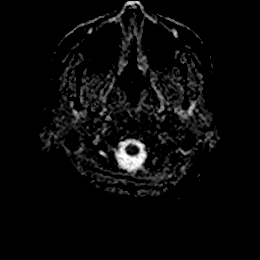
[im 48/48]
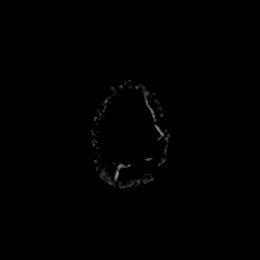

[Series 7: DWI · coronal · 4.0mm · 0.88mm/px · 4 of 64 slices shown (3 of 4)]
[im 1/64]
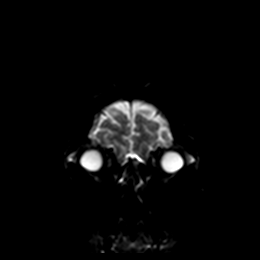
[im 22/64]
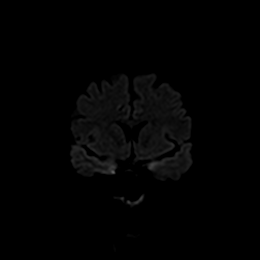
[im 43/64]
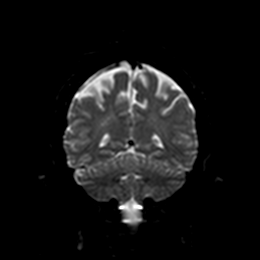
[im 64/64]
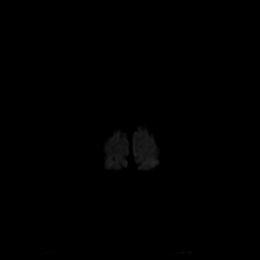

[Series 8: DWI · coronal · 4.0mm · 0.88mm/px · 2 of 32 slices shown (4 of 4)]
[im 1/32]
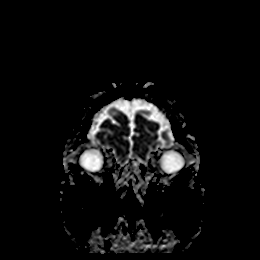
[im 32/32]
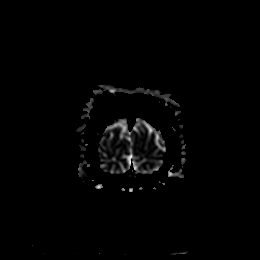

[Series 9: T1 · sagittal · 5.0mm · 0.75mm/px · 2 of 23 slices shown (1 of 3)]
[im 1/23]
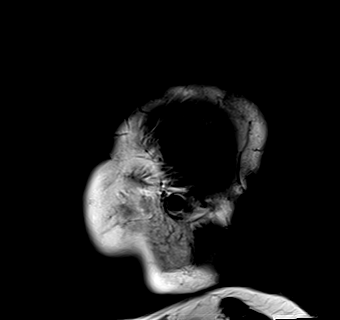
[im 23/23]
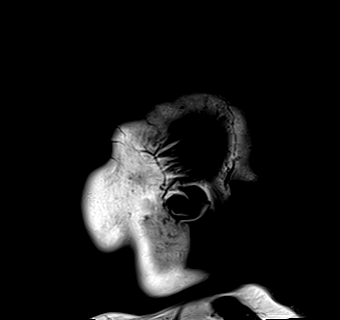

[Series 10: T2 · axial · 5.0mm · 0.72mm/px · z∈[-55,+88]mm · 2 of 25 slices shown]
[im 1/25]
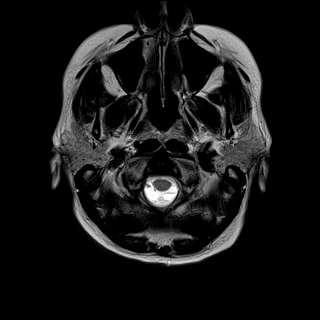
[im 25/25]
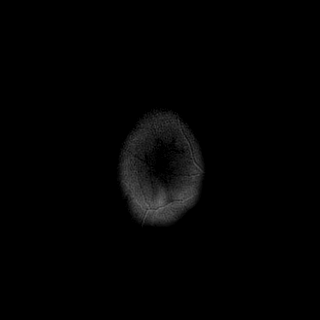

[Series 11: FLAIR · axial · 5.0mm · 0.45mm/px · z∈[-55,+89]mm · 2 of 25 slices shown]
[im 1/25]
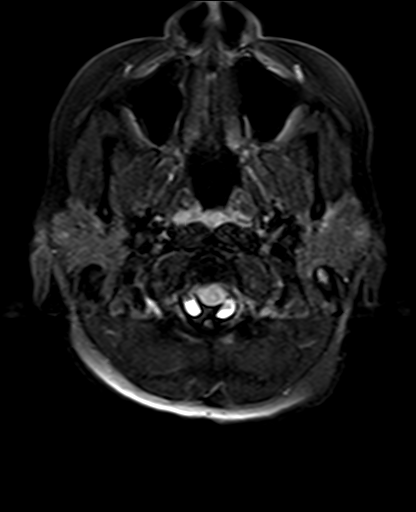
[im 25/25]
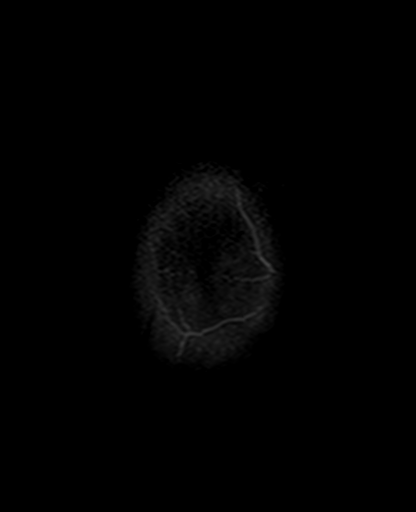

[Series 13: pha_images · axial · 3.0mm · 0.90mm/px · z∈[-68,+99]mm · 4 of 57 slices shown]
[im 1/57]
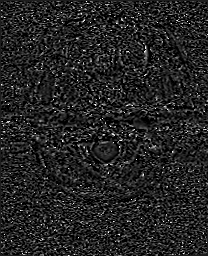
[im 19/57]
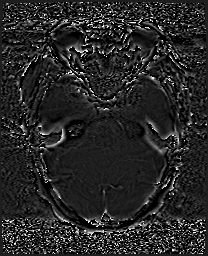
[im 38/57]
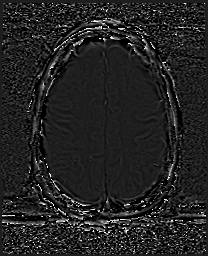
[im 57/57]
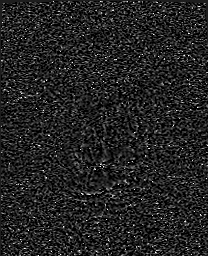

[Series 14: swi_images · axial · 3.0mm · 0.90mm/px · z∈[-71,-15]mm · 2 of 60 slices shown]
[im 1/60]
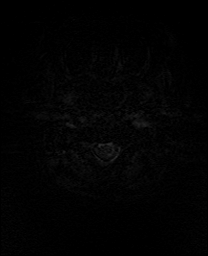
[im 20/60]
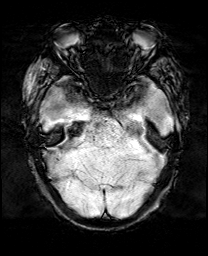

[Series 17: T1 · axial · non-contrast · 3.0mm · 0.37mm/px · z∈[-62,+38]mm · 2 of 25 slices shown (2 of 3)]
[im 1/25]
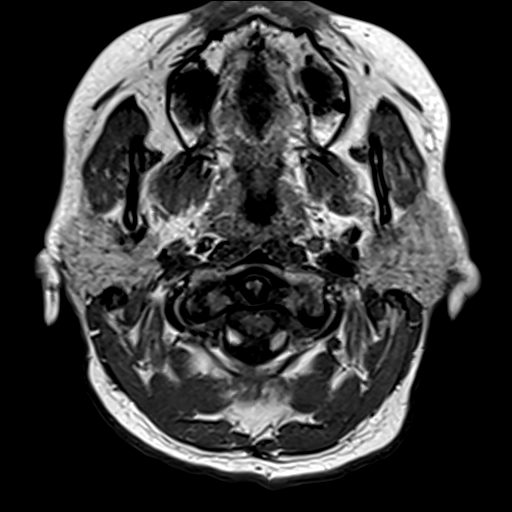
[im 25/25]
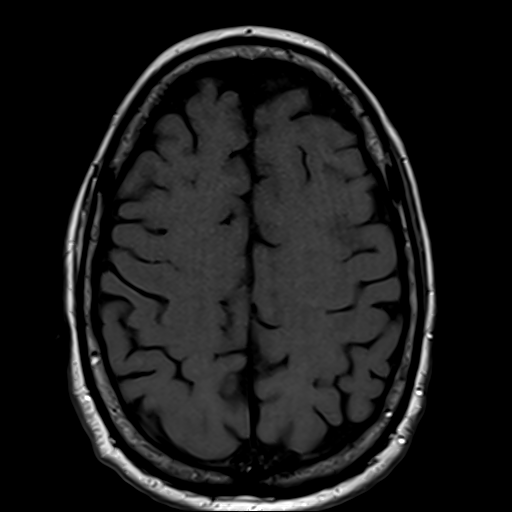

[Series 18: T2 fat-sat · axial · 3.0mm · 0.54mm/px · z∈[-62,+38]mm · 2 of 25 slices shown (1 of 6)]
[im 1/25]
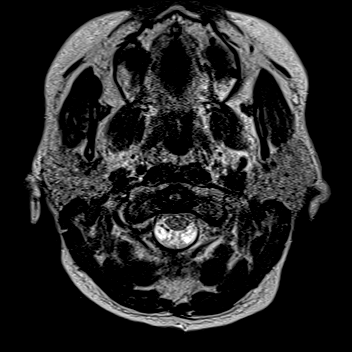
[im 25/25]
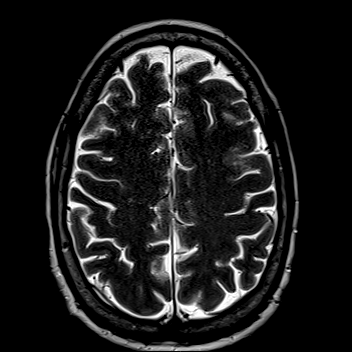

[Series 19: T2 fat-sat · axial · 3.0mm · 0.54mm/px · z∈[-62,+38]mm · 2 of 25 slices shown (2 of 6)]
[im 1/25]
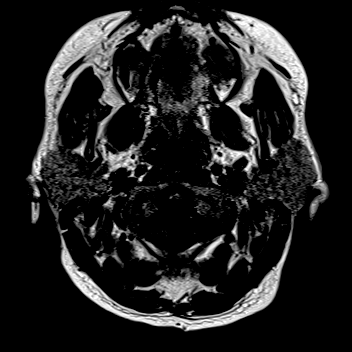
[im 25/25]
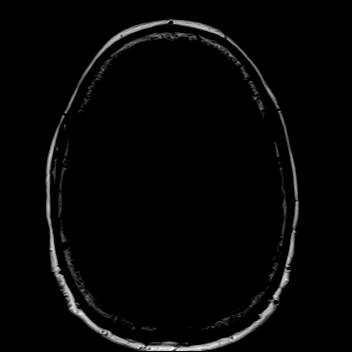

[Series 20: T2 fat-sat · axial · 3.0mm · 0.54mm/px · z∈[-62,+38]mm · 2 of 25 slices shown (3 of 6)]
[im 1/25]
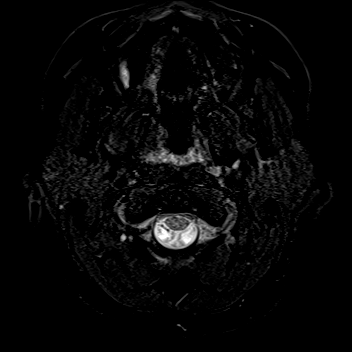
[im 25/25]
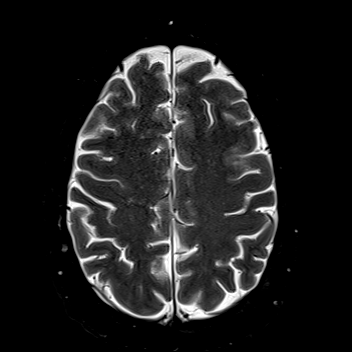

[Series 21: T1 · coronal · 3.0mm · 0.37mm/px · 2 of 25 slices shown (3 of 3)]
[im 1/25]
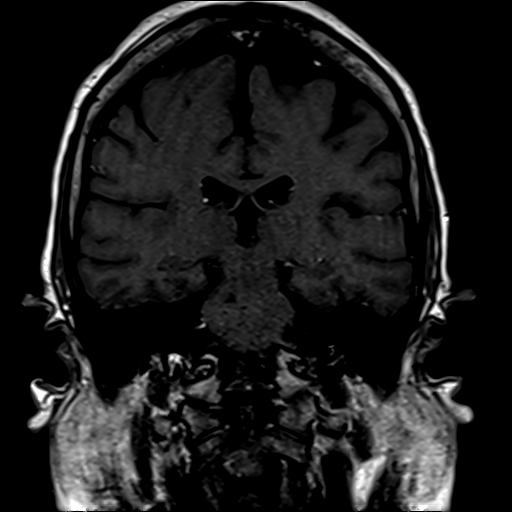
[im 25/25]
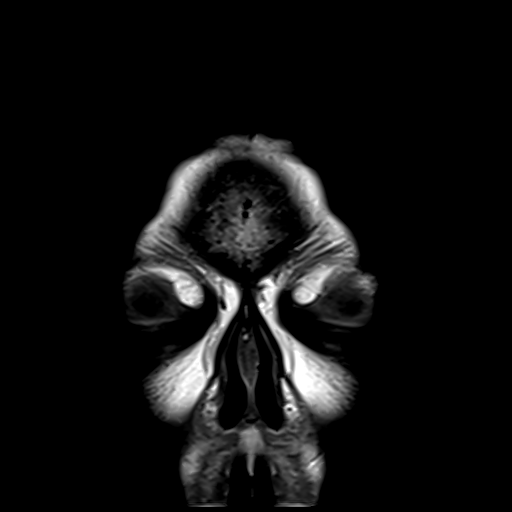

[Series 22: T2 fat-sat · coronal · 3.0mm · 0.54mm/px · 2 of 25 slices shown (4 of 6)]
[im 1/25]
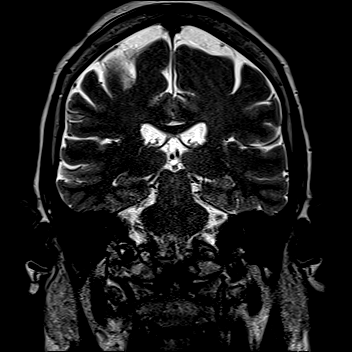
[im 25/25]
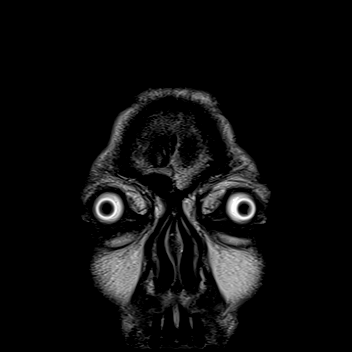

[Series 23: T2 fat-sat · coronal · 3.0mm · 0.54mm/px · 2 of 25 slices shown (5 of 6)]
[im 1/25]
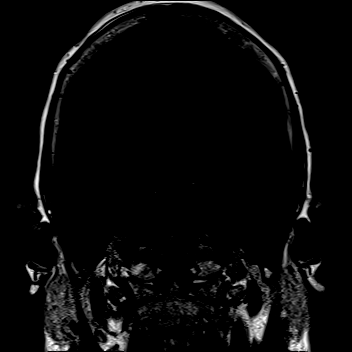
[im 25/25]
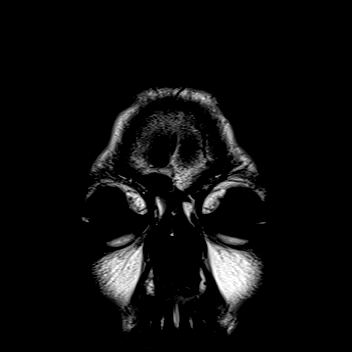

[Series 24: T2 fat-sat · coronal · 3.0mm · 0.54mm/px · 2 of 25 slices shown (6 of 6)]
[im 1/25]
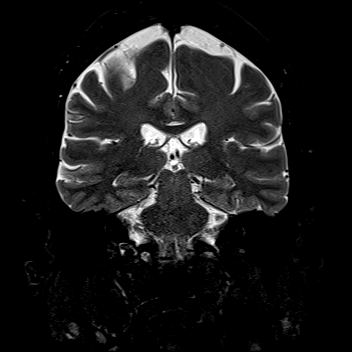
[im 25/25]
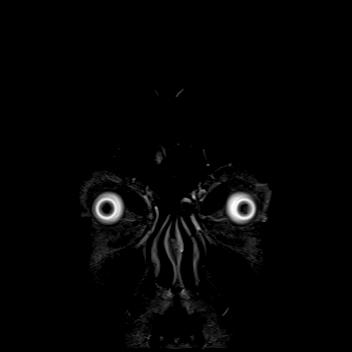

[42 of 48 positions shown; findings below may reference images not displayed]

FINDINGS: MRI HEAD FINDINGS

Brain: No acute infarct, mass effect or extra-axial collection. No
acute or chronic hemorrhage. There is multifocal hyperintense
T2-weighted signal within the white matter. Parenchymal volume and
CSF spaces are normal. There are old right pontine and left basal
ganglia small vessel infarct. The midline structures are normal.

Vascular: Major flow voids are preserved.

Skull and upper cervical spine: Normal calvarium and skull base.
Visualized upper cervical spine and soft tissues are normal.

Sinuses/Orbits:No paranasal sinus fluid levels or advanced mucosal
thickening. No mastoid or middle ear effusion. Normal orbits.

MRI ORBITS FINDINGS

Orbits: The globes are intact. Optic nerves are normal. Normal
extraocular muscles and lacrimal glands. The bony orbit, preseptal
soft tissues and the intra- and extraconal orbital fat are normal.

Visualized sinuses:  No fluid levels or advanced mucosal thickening.

Soft tissues: Normal.
IMPRESSION: 1. Mild chronic small vessel disease with old right pontine and left
basal ganglia small vessel infarcts.
2. Normal orbits.
